# Patient Record
Sex: Male | Born: 1950
Health system: Southern US, Community
[De-identification: ages and names within clinical notes are randomized; demographics above are authoritative.]

## PROBLEM LIST (undated history)

## (undated) DIAGNOSIS — M1990 Unspecified osteoarthritis, unspecified site: Secondary | ICD-10-CM

## (undated) DIAGNOSIS — I509 Heart failure, unspecified: Secondary | ICD-10-CM

## (undated) DIAGNOSIS — J90 Pleural effusion, not elsewhere classified: Secondary | ICD-10-CM

## (undated) DIAGNOSIS — D631 Anemia in chronic kidney disease: Secondary | ICD-10-CM

## (undated) DIAGNOSIS — L97509 Non-pressure chronic ulcer of other part of unspecified foot with unspecified severity: Secondary | ICD-10-CM

## (undated) DIAGNOSIS — G709 Myoneural disorder, unspecified: Secondary | ICD-10-CM

## (undated) DIAGNOSIS — D509 Iron deficiency anemia, unspecified: Secondary | ICD-10-CM

## (undated) DIAGNOSIS — E119 Type 2 diabetes mellitus without complications: Secondary | ICD-10-CM

## (undated) DIAGNOSIS — F419 Anxiety disorder, unspecified: Secondary | ICD-10-CM

## (undated) DIAGNOSIS — I10 Essential (primary) hypertension: Secondary | ICD-10-CM

## (undated) DIAGNOSIS — N184 Chronic kidney disease, stage 4 (severe): Secondary | ICD-10-CM

## (undated) DIAGNOSIS — E785 Hyperlipidemia, unspecified: Secondary | ICD-10-CM

## (undated) HISTORY — DX: Non-pressure chronic ulcer of other part of unspecified foot with unspecified severity: L97.509

## (undated) HISTORY — DX: Iron deficiency anemia, unspecified: D50.9

## (undated) HISTORY — PX: COLONOSCOPY: SHX174

## (undated) HISTORY — DX: Type 2 diabetes mellitus without complications: E11.9

## (undated) HISTORY — DX: Essential (primary) hypertension: I10

## (undated) HISTORY — DX: Anemia in chronic kidney disease: D63.1

## (undated) HISTORY — PX: CYST EXCISION: SHX5701

## (undated) HISTORY — DX: Hyperlipidemia, unspecified: E78.5

## (undated) HISTORY — PX: ACHILLES TENDON REPAIR: SUR1153

## (undated) HISTORY — DX: Chronic kidney disease, unspecified: D63.1

---

## 2002-12-19 ENCOUNTER — Inpatient Hospital Stay (HOSPITAL_COMMUNITY): Admission: AD | Admit: 2002-12-19 | Discharge: 2002-12-22 | Payer: Self-pay | Admitting: Internal Medicine

## 2004-11-21 ENCOUNTER — Emergency Department (HOSPITAL_COMMUNITY): Admission: EM | Admit: 2004-11-21 | Discharge: 2004-11-21 | Payer: Self-pay | Admitting: Emergency Medicine

## 2005-06-19 ENCOUNTER — Encounter: Admission: RE | Admit: 2005-06-19 | Discharge: 2005-06-19 | Payer: Self-pay | Admitting: Orthopedic Surgery

## 2005-06-21 ENCOUNTER — Encounter: Admission: RE | Admit: 2005-06-21 | Discharge: 2005-06-21 | Payer: Self-pay | Admitting: Orthopedic Surgery

## 2010-03-04 ENCOUNTER — Observation Stay (HOSPITAL_COMMUNITY): Admission: EM | Admit: 2010-03-04 | Discharge: 2010-03-05 | Payer: Self-pay | Admitting: Emergency Medicine

## 2010-03-04 ENCOUNTER — Emergency Department (HOSPITAL_COMMUNITY): Admission: EM | Admit: 2010-03-04 | Discharge: 2010-03-04 | Payer: Self-pay | Admitting: Emergency Medicine

## 2010-06-17 ENCOUNTER — Emergency Department (HOSPITAL_COMMUNITY): Payer: No Typology Code available for payment source

## 2010-06-17 ENCOUNTER — Emergency Department (HOSPITAL_COMMUNITY)
Admission: EM | Admit: 2010-06-17 | Discharge: 2010-06-17 | Disposition: A | Discharge status: Home / Self Care | Payer: No Typology Code available for payment source | Attending: Emergency Medicine | Admitting: Emergency Medicine

## 2010-06-17 DIAGNOSIS — E119 Type 2 diabetes mellitus without complications: Secondary | ICD-10-CM | POA: Insufficient documentation

## 2010-06-17 DIAGNOSIS — I10 Essential (primary) hypertension: Secondary | ICD-10-CM | POA: Insufficient documentation

## 2010-06-17 DIAGNOSIS — Z79899 Other long term (current) drug therapy: Secondary | ICD-10-CM | POA: Insufficient documentation

## 2010-06-17 DIAGNOSIS — Z794 Long term (current) use of insulin: Secondary | ICD-10-CM | POA: Insufficient documentation

## 2010-06-17 DIAGNOSIS — IMO0002 Reserved for concepts with insufficient information to code with codable children: Secondary | ICD-10-CM | POA: Insufficient documentation

## 2010-06-17 LAB — BLOOD GAS, VENOUS
Acid-Base Excess: 1.3 mmol/L (ref 0.0–2.0)
Bicarbonate: 27.2 mEq/L — ABNORMAL HIGH (ref 20.0–24.0)
O2 Saturation: 57.8 %
Patient temperature: 98.6
TCO2: 23.9 mmol/L (ref 0–100)
pCO2, Ven: 49.3 mmHg (ref 45.0–50.0)
pH, Ven: 7.36 — ABNORMAL HIGH (ref 7.250–7.300)
pO2, Ven: 34.4 mmHg (ref 30.0–45.0)

## 2010-06-17 LAB — DIFFERENTIAL
Basophils Absolute: 0 10*3/uL (ref 0.0–0.1)
Basophils Relative: 1 % (ref 0–1)
Eosinophils Absolute: 0.3 10*3/uL (ref 0.0–0.7)
Eosinophils Relative: 4 % (ref 0–5)
Lymphocytes Relative: 31 % (ref 12–46)
Lymphs Abs: 2.6 10*3/uL (ref 0.7–4.0)
Monocytes Absolute: 0.6 10*3/uL (ref 0.1–1.0)
Monocytes Relative: 8 % (ref 3–12)
Neutro Abs: 4.8 10*3/uL (ref 1.7–7.7)
Neutrophils Relative %: 57 % (ref 43–77)

## 2010-06-17 LAB — URINALYSIS, ROUTINE W REFLEX MICROSCOPIC
Bilirubin Urine: NEGATIVE
Hgb urine dipstick: NEGATIVE
Ketones, ur: NEGATIVE mg/dL
Leukocytes, UA: NEGATIVE
Nitrite: NEGATIVE
Protein, ur: NEGATIVE mg/dL
Specific Gravity, Urine: 1.024 (ref 1.005–1.030)
Urine Glucose, Fasting: >1000 mg/dL — AB
Urobilinogen, UA: 0.2 mg/dL (ref 0.0–1.0)
pH: 7 (ref 5.0–8.0)

## 2010-06-17 LAB — POCT I-STAT, CHEM 8
BUN: 14 mg/dL (ref 6–23)
Calcium, Ion: 1.02 mmol/L — ABNORMAL LOW (ref 1.12–1.32)
Chloride: 104 mEq/L (ref 96–112)
Creatinine, Ser: 1.5 mg/dL (ref 0.4–1.5)
Glucose, Bld: 417 mg/dL — ABNORMAL HIGH (ref 70–99)
HCT: 48 % (ref 39.0–52.0)
Hemoglobin: 16.3 g/dL (ref 13.0–17.0)
Potassium: 4.8 mEq/L (ref 3.5–5.1)
Sodium: 137 mEq/L (ref 135–145)
TCO2: 24 mmol/L (ref 0–100)

## 2010-06-17 LAB — URINE MICROSCOPIC-ADD ON

## 2010-06-17 LAB — GLUCOSE, CAPILLARY: Glucose-Capillary: 174 mg/dL — ABNORMAL HIGH (ref 70–99)

## 2010-06-17 LAB — CBC
HCT: 44.7 % (ref 39.0–52.0)
Hemoglobin: 15.6 g/dL (ref 13.0–17.0)
MCH: 29.9 pg (ref 26.0–34.0)
MCHC: 34.9 g/dL (ref 30.0–36.0)
MCV: 85.6 fL (ref 78.0–100.0)
Platelets: 184 10*3/uL (ref 150–400)
RBC: 5.22 MIL/uL (ref 4.22–5.81)
RDW: 12.7 % (ref 11.5–15.5)
WBC: 8.3 10*3/uL (ref 4.0–10.5)

## 2010-07-17 LAB — URINALYSIS, ROUTINE W REFLEX MICROSCOPIC
Bilirubin Urine: NEGATIVE
Glucose, UA: >1000 mg/dL — AB
Hgb urine dipstick: NEGATIVE
Ketones, ur: 15 mg/dL — AB
Leukocytes, UA: NEGATIVE
Nitrite: NEGATIVE
Protein, ur: NEGATIVE mg/dL
Specific Gravity, Urine: 1.035 — ABNORMAL HIGH (ref 1.005–1.030)
Urobilinogen, UA: 0.2 mg/dL (ref 0.0–1.0)
pH: 5.5 (ref 5.0–8.0)

## 2010-07-17 LAB — GLUCOSE, CAPILLARY
Glucose-Capillary: 188 mg/dL — ABNORMAL HIGH (ref 70–99)
Glucose-Capillary: 256 mg/dL — ABNORMAL HIGH (ref 70–99)
Glucose-Capillary: 343 mg/dL — ABNORMAL HIGH (ref 70–99)
Glucose-Capillary: 350 mg/dL — ABNORMAL HIGH (ref 70–99)
Glucose-Capillary: 406 mg/dL — ABNORMAL HIGH (ref 70–99)
Glucose-Capillary: 408 mg/dL — ABNORMAL HIGH (ref 70–99)
Glucose-Capillary: 458 mg/dL — ABNORMAL HIGH (ref 70–99)
Glucose-Capillary: 506 mg/dL — ABNORMAL HIGH (ref 70–99)

## 2010-07-17 LAB — COMPREHENSIVE METABOLIC PANEL
ALT: 19 U/L (ref 0–53)
AST: 22 U/L (ref 0–37)
Albumin: 4.1 g/dL (ref 3.5–5.2)
Alkaline Phosphatase: 105 U/L (ref 39–117)
BUN: 19 mg/dL (ref 6–23)
CO2: 24 mEq/L (ref 19–32)
Calcium: 9.3 mg/dL (ref 8.4–10.5)
Chloride: 96 mEq/L (ref 96–112)
Creatinine, Ser: 1.74 mg/dL — ABNORMAL HIGH (ref 0.4–1.5)
GFR calc Af Amer: 49 mL/min — ABNORMAL LOW (ref >60)
GFR calc non Af Amer: 40 mL/min — ABNORMAL LOW (ref >60)
Glucose, Bld: 465 mg/dL — ABNORMAL HIGH (ref 70–99)
Potassium: 4.9 mEq/L (ref 3.5–5.1)
Sodium: 132 mEq/L — ABNORMAL LOW (ref 135–145)
Total Bilirubin: 1 mg/dL (ref 0.3–1.2)
Total Protein: 8.4 g/dL — ABNORMAL HIGH (ref 6.0–8.3)

## 2010-07-17 LAB — DIFFERENTIAL
Basophils Absolute: 0 10*3/uL (ref 0.0–0.1)
Basophils Relative: 0 % (ref 0–1)
Eosinophils Absolute: 0.2 10*3/uL (ref 0.0–0.7)
Eosinophils Relative: 2 % (ref 0–5)
Lymphocytes Relative: 26 % (ref 12–46)
Lymphs Abs: 3.4 10*3/uL (ref 0.7–4.0)
Monocytes Absolute: 0.9 10*3/uL (ref 0.1–1.0)
Monocytes Relative: 7 % (ref 3–12)
Neutro Abs: 8.4 10*3/uL — ABNORMAL HIGH (ref 1.7–7.7)
Neutrophils Relative %: 65 % (ref 43–77)

## 2010-07-17 LAB — URINE MICROSCOPIC-ADD ON

## 2010-07-17 LAB — CBC
HCT: 50.5 % (ref 39.0–52.0)
Hemoglobin: 17.9 g/dL — ABNORMAL HIGH (ref 13.0–17.0)
MCH: 30.2 pg (ref 26.0–34.0)
MCHC: 35.4 g/dL (ref 30.0–36.0)
MCV: 85.2 fL (ref 78.0–100.0)
Platelets: 242 10*3/uL (ref 150–400)
RBC: 5.93 MIL/uL — ABNORMAL HIGH (ref 4.22–5.81)
RDW: 12.6 % (ref 11.5–15.5)
WBC: 12.9 10*3/uL — ABNORMAL HIGH (ref 4.0–10.5)

## 2010-07-17 LAB — KETONES, QUALITATIVE: Acetone, Bld: NEGATIVE

## 2010-07-17 LAB — TROPONIN I: Troponin I: 0.02 ng/mL (ref 0.00–0.06)

## 2010-09-20 NOTE — Discharge Summary (Signed)
NAMEESPEN, LORINCZ                          ACCOUNT NO.:  [id number]   MEDICAL RECORD NO.:  [id number]                   PATIENT TYPE:  INP   LOCATION:  6729                                 FACILITY:  [facility]   PHYSICIAN:  Nolene Ebbs, M.D.                 DATE OF BIRTH:  03-02-1951   DATE OF ADMISSION:  12/19/2002  DATE OF DISCHARGE:  12/22/2002                                 DISCHARGE SUMMARY   ADMISSION DIAGNOSES:  1. New onset type 2 diabetes mellitus.  2. Dehydration.   DISCHARGE DIAGNOSES:  1. New onset type 2 diabetes mellitus.  2. Dyslipidemia.  3. Metabolic syndrome.   HISTORY OF PRESENT ILLNESS:  The patient is a 60 year old African-American  gentleman admitted directly from the office of [facility] with two  to three-week history of polydipsia, polyuria, weight loss, progressive  fatigue, weakness, and blurring of vision.  He has a strong family history  of diabetes in his father.  On initial evaluation, he was very dehydrated  with CBG reported as high, meaning over 500.  He has glucose and ketonuria.  His hemoglobin A1C was 20.7.  He was initially started in the office with  intravenous normal saline 1 liter and 10 units of Novolog Insulin and  subsequently admitted to a medical bed at [facility] for management  of severe hyperglycemia, diabetic condition, and initiation of therapy.   HOSPITAL COURSE:  On admission, the patient was started on intravenous  fluids, normal saline large volume.  Subcu 70/30 Insulin with sliding scale  insulin coverage. His initial studies including serum acetone was negative.  His blood gas did not show any acidosis.  Diabetic teaching was requested.  The patient was taught the basics of diabetes as well as insulin injections  and monitoring of CBG's and nutrition.  He immediately began to feel better  within 24 hours of admission.  A fasting lipid profile showed a total  cholesterol of 204, triglycerides of  510, HDL of 29.  He had one episode of  hypokalemia with potassium of 3.2 and this was repleted.  He was started on  Zocor 400 mg once a day as well as ACE inhibitor for renal protection.  On  December 22, 2002, the patient felt much better.  He did have a few headaches.  His vital signs were stable.  His CBG was 259.  His physical examination was  otherwise unremarkable.  He was felt to be stable for discharge.   DISCHARGE MEDICATIONS:  1. Altace 5 mg once a day.  2. Zocor 40 mg once a day.  3. Protonix 10 mg twice a day.  4. Actos 40 units q.h.s.   DIET:  1800 calorie ADA diet, 2 gram sodium, low cholesterol diet.  He is to  have CBG's t.i.d.   He is to return for review in the office.  Also to be followed  up at [facility] with Nolene Ebbs, M.D.   CONDITION ON DISCHARGE:  Stable.                                                Nolene Ebbs, M.D.    EA/MEDQ  D:  01/25/2003  T:  01/26/2003  Job:  [id number]

## 2015-02-28 DIAGNOSIS — R079 Chest pain, unspecified: Secondary | ICD-10-CM | POA: Diagnosis not present

## 2015-02-28 DIAGNOSIS — I10 Essential (primary) hypertension: Secondary | ICD-10-CM | POA: Diagnosis not present

## 2015-02-28 DIAGNOSIS — E781 Pure hyperglyceridemia: Secondary | ICD-10-CM | POA: Diagnosis not present

## 2015-02-28 DIAGNOSIS — E119 Type 2 diabetes mellitus without complications: Secondary | ICD-10-CM | POA: Diagnosis not present

## 2015-02-28 DIAGNOSIS — R972 Elevated prostate specific antigen [PSA]: Secondary | ICD-10-CM | POA: Diagnosis not present

## 2015-02-28 DIAGNOSIS — M2550 Pain in unspecified joint: Secondary | ICD-10-CM | POA: Diagnosis not present

## 2015-04-15 ENCOUNTER — Ambulatory Visit (INDEPENDENT_AMBULATORY_CARE_PROVIDER_SITE_OTHER): Payer: Medicare Other | Admitting: Family Medicine

## 2015-04-15 VITALS — BP 110/68 | Temp 98.6°F | Resp 16 | Ht 71.0 in | Wt 194.0 lb

## 2015-04-15 DIAGNOSIS — E119 Type 2 diabetes mellitus without complications: Secondary | ICD-10-CM

## 2015-04-15 DIAGNOSIS — Z794 Long term (current) use of insulin: Secondary | ICD-10-CM

## 2015-04-15 DIAGNOSIS — L0291 Cutaneous abscess, unspecified: Secondary | ICD-10-CM

## 2015-04-15 DIAGNOSIS — L0390 Cellulitis, unspecified: Secondary | ICD-10-CM | POA: Diagnosis not present

## 2015-04-15 MED ORDER — DOXYCYCLINE HYCLATE 100 MG PO TABS: 100.0000 mg | ORAL_TABLET | Freq: Two times a day (BID) | ORAL | Status: DC

## 2015-04-15 MED ORDER — SULFAMETHOXAZOLE-TRIMETHOPRIM 800-160 MG PO TABS: 1.0000 | ORAL_TABLET | Freq: Two times a day (BID) | ORAL | Status: DC

## 2015-04-15 MED ORDER — HYDROCODONE-ACETAMINOPHEN 5-325 MG PO TABS: 1.0000 | ORAL_TABLET | Freq: Four times a day (QID) | ORAL | Status: DC | PRN

## 2015-04-15 NOTE — Progress Notes (Signed)
   Subjective:    Patient ID: Melvin Mcclure, male    DOB: 03-Sep-1950, 64 y.o.   MRN: [place]:[id number] By signing my name below, I, Zola Button, attest that this documentation has been prepared under the direction and in the presence of Robyn Haber, MD.  Electronically Signed: Zola Button, Medical Scribe. 04/15/2015. 10:54 AM.  HPI HPI Comments: Melvin Mcclure is a 64 y.o. male with a history of DM who presents to the [facility] complaining of an abscess to the left gluteal crest of his buttocks that started 3-4 days ago. Patient has been using hot compresses. He has tried ibuprofen and Aleve but without relief.   Patient used to play basketball, football and baseball.   Review of Systems  Constitutional: Negative for fever.  Skin:       Abscess        Objective:   Physical Exam CONSTITUTIONAL: Well developed/well nourished HEAD: Normocephalic/atraumatic EYES: EOM/PERRL ENMT: Mucous membranes moist NECK: supple no meningeal signs SPINE: entire spine nontender CV: S1/S2 noted, no murmurs/rubs/gallops noted LUNGS: Lungs are clear to auscultation bilaterally, no apparent distress GU: 3 cm indurated fluctuant mass in the left gluteal cleft that is quite tender to palpation. NEURO: Pt is awake/alert, moves all extremitiesx4 EXTREMITIES: pulses normal, full ROM SKIN: warm, color normal PSYCH: no abnormalities of mood noted        Assessment & Plan:   This chart was scribed in my presence and reviewed by me personally.    ICD-9-CM ICD-10-CM   1. Cellulitis and abscess 682.9 L03.90 doxycycline (VIBRA-TABS) 100 MG tablet    L02.91 HYDROcodone-acetaminophen (NORCO) 5-325 MG tablet     Wound culture  2. Type 2 diabetes mellitus without complication, with long-term current use of insulin (HCC) 250.00 E11.9    V58.67 Z79.4      Signed, Robyn Haber, MD

## 2015-04-15 NOTE — Addendum Note (Signed)
Addended by: Orion Crook on: 04/15/2015 01:54 PM   Modules accepted: Orders

## 2015-04-15 NOTE — Progress Notes (Signed)
PROCEDURE NOTE: I&D of Abscess Verbal consent obtained. Local anesthesia with 3cc of 2% lidocaine with epinephrine. Site cleansed with alcohol prep pad.  Incision of 2cm was made using a 11 blade, discharge of copious amounts of pus and serosanguinous fluid. Wound cavity was explored with curved hemostats and aggressively packed with 1/2" plain packing. Cleansed and dressed. After care instructions provided. Patient to return to clinic on 04/17/2015 for reevaluation/repacking.  Jaynee Eagles, PA-C [facility] [phone] 04/15/2015  11:49 AM

## 2015-04-15 NOTE — Addendum Note (Signed)
Addended by: Orion Crook on: 04/15/2015 03:19 PM   Modules accepted: Orders, Medications

## 2015-04-15 NOTE — Patient Instructions (Addendum)
Abscess An abscess is an infected area that contains a collection of pus and debris.It can occur in almost any part of the body. An abscess is also known as a furuncle or boil. CAUSES  An abscess occurs when tissue gets infected. This can occur from blockage of oil or sweat glands, infection of hair follicles, or a minor injury to the skin. As the body tries to fight the infection, pus collects in the area and creates pressure under the skin. This pressure causes pain. People with weakened immune systems have difficulty fighting infections and get certain abscesses more often.  SYMPTOMS Usually an abscess develops on the skin and becomes a painful mass that is red, warm, and tender. If the abscess forms under the skin, you may feel a moveable soft area under the skin. Some abscesses break open (rupture) on their own, but most will continue to get worse without care. The infection can spread deeper into the body and eventually into the bloodstream, causing you to feel ill.  DIAGNOSIS  Your caregiver will take your medical history and perform a physical exam. A sample of fluid may also be taken from the abscess to determine what is causing your infection. TREATMENT  Your caregiver may prescribe antibiotic medicines to fight the infection. However, taking antibiotics alone usually does not cure an abscess. Your caregiver may need to make a small cut (incision) in the abscess to drain the pus. In some cases, gauze is packed into the abscess to reduce pain and to continue draining the area. HOME CARE INSTRUCTIONS   Only take over-the-counter or prescription medicines for pain, discomfort, or fever as directed by your caregiver.  If you were prescribed antibiotics, take them as directed. Finish them even if you start to feel better.  If gauze is used, follow your caregiver's directions for changing the gauze.  To avoid spreading the infection:  Keep your draining abscess covered with a  bandage.  Wash your hands well.  Do not share personal care items, towels, or whirlpools with others.  Avoid skin contact with others.  Keep your skin and clothes clean around the abscess.  Keep all follow-up appointments as directed by your caregiver. SEEK MEDICAL CARE IF:   You have increased pain, swelling, redness, fluid drainage, or bleeding.  You have muscle aches, chills, or a general ill feeling.  You have a fever. MAKE SURE YOU:   Understand these instructions.  Will watch your condition.  Will get help right away if you are not doing well or get worse.   This information is not intended to replace advice given to you by your health care provider. Make sure you discuss any questions you have with your health care provider.   Document Released: 01/29/2005 Document Revised: 10/21/2011 Document Reviewed: 07/04/2011 Elsevier Interactive Patient Education 2016 Elsevier Inc.  Incision and Drainage, Care After Refer to this sheet in the next few weeks. These instructions provide you with information on caring for yourself after your procedure. Your caregiver may also give you more specific instructions. Your treatment has been planned according to current medical practices, but problems sometimes occur. Call your caregiver if you have any problems or questions after your procedure. HOME CARE INSTRUCTIONS   If antibiotic medicine is given, take it as directed. Finish it even if you start to feel better.  Only take over-the-counter or prescription medicines for pain, discomfort, or fever as directed by your caregiver.  Keep all follow-up appointments as directed by your caregiver.    Change any bandages (dressings) as directed by your caregiver. Replace old dressings with clean dressings.  Wash your hands before and after caring for your wound. You will receive specific instructions for cleansing and caring for your wound.  SEEK MEDICAL CARE IF:   You have increased  pain, swelling, or redness around the wound.  You have increased drainage, smell, or bleeding from the wound.  You have muscle aches, chills, or you feel generally sick.  You have a fever. MAKE SURE YOU:   Understand these instructions.  Will watch your condition.  Will get help right away if you are not doing well or get worse.   This information is not intended to replace advice given to you by your health care provider. Make sure you discuss any questions you have with your health care provider.   Document Released: 07/14/2011 Document Revised: 05/12/2014 Document Reviewed: 07/14/2011 Elsevier Interactive Patient Education 2016 Elsevier Inc.   

## 2015-04-17 ENCOUNTER — Ambulatory Visit (INDEPENDENT_AMBULATORY_CARE_PROVIDER_SITE_OTHER): Payer: Medicare Other | Admitting: Physician Assistant

## 2015-04-17 VITALS — BP 114/84 | HR 74 | Temp 97.7°F | Resp 18 | Ht 71.0 in

## 2015-04-17 DIAGNOSIS — L0291 Cutaneous abscess, unspecified: Secondary | ICD-10-CM

## 2015-04-17 DIAGNOSIS — L0390 Cellulitis, unspecified: Secondary | ICD-10-CM

## 2015-04-17 LAB — WOUND CULTURE: Gram Stain: NONE SEEN

## 2015-04-17 NOTE — Progress Notes (Signed)
[facility] [address] [place] [address] [phone]  Date:  04/17/2015   Name:  Melvin Mcclure   DOB:  12/15/1950   MRN:  [id number]  PCP:  No primary care provider on file.    Chief Complaint: Wound Check   History of Present Illness:  This is a 64 y.o. male with PMH DM2, HTN who is presenting for wound care of an abscess on his buttock that underwent I&D 2 days ago. He was placed on bactrim. Doing well. States pain is much decreased but still with a lot of drainage. He is staying of his back side and applying heating pad. He denies fever or chills.  Review of Systems:  Review of Systems See HPI  There are no active problems to display for this patient.   Prior to Admission medications   Medication Sig Start Date End Date Taking? Authorizing Provider  amLODipine (NORVASC) 10 MG tablet Take 10 mg by mouth daily.   Yes Historical Provider, MD  aspirin 81 MG tablet Take 81 mg by mouth daily.   Yes Historical Provider, MD  gabapentin (NEURONTIN) 100 MG capsule Take 100 mg by mouth 3 (three) times daily.   Yes Historical Provider, MD  HYDROcodone-acetaminophen (NORCO) 5-325 MG tablet Take 1 tablet by mouth every 6 (six) hours as needed for moderate pain. 04/15/15  Yes Robyn Haber, MD  insulin aspart (NOVOLOG) 100 UNIT/ML injection Inject into the skin 3 (three) times daily before meals.   Yes Historical Provider, MD  insulin glargine (LANTUS) 100 UNIT/ML injection Inject into the skin at bedtime.   Yes Historical Provider, MD  sulfamethoxazole-trimethoprim (BACTRIM DS,SEPTRA DS) 800-160 MG tablet Take 1 tablet by mouth 2 (two) times daily. 04/15/15  Yes Robyn Haber, MD    No Known Allergies  Past Surgical History  Procedure Laterality Date  . Achilles tendon repair    . Cyst excision      Social History  Substance Use Topics  . Smoking status: Never Smoker   . Smokeless tobacco: None  . Alcohol Use: No    History reviewed. No  pertinent family history.  Medication list has been reviewed and updated.  Physical Examination:  Physical Exam  Constitutional: He is oriented to person, place, and time. He appears well-developed and well-nourished. No distress.  HENT:  Head: Normocephalic and atraumatic.  Right Ear: Hearing normal.  Left Ear: Hearing normal.  Nose: Nose normal.  Eyes: Conjunctivae and lids are normal. Right eye exhibits no discharge. Left eye exhibits no discharge. No scleral icterus.  Pulmonary/Chest: Effort normal. No respiratory distress.  Musculoskeletal: Normal range of motion.  Neurological: He is alert and oriented to person, place, and time.  Skin: Skin is warm and dry.  Dressing and packing removed.  Location: gluteal crest, left of midline Induration and maceration present Incision size: 1.5 cm Wound depth: 2 cm, tracks slightly toward midline Wound irrigated with 5 cc 1% lidocaine without epi Small additional purulence. 1/4 inch packing aggressively placed. Dressing replaced.   Psychiatric: He has a normal mood and affect. His speech is normal and behavior is normal. Thought content normal.   BP 114/84 mmHg  Pulse 74  Temp(Src) 97.7 F (36.5 C) (Oral)  Resp 18  Ht 5\' 11"  (1.803 m)  SpO2 98%  Assessment and Plan:  1. Cellulitis and abscess Still with purulent drainage. Symptoms have improved. Wound repacked and dressing placed. Continue bactrim. Discussed wound care. Return in 48 hours for wound care.  Benjaman Pott Drenda Freeze, MHS [facility]  04/17/2015

## 2015-04-19 ENCOUNTER — Encounter: Payer: Self-pay | Admitting: Physician Assistant

## 2015-04-19 ENCOUNTER — Ambulatory Visit (INDEPENDENT_AMBULATORY_CARE_PROVIDER_SITE_OTHER): Payer: Medicare Other | Admitting: Physician Assistant

## 2015-04-19 VITALS — BP 112/72 | HR 80 | Temp 97.9°F | Resp 18 | Ht 71.0 in

## 2015-04-19 DIAGNOSIS — L0390 Cellulitis, unspecified: Secondary | ICD-10-CM

## 2015-04-19 DIAGNOSIS — L0291 Cutaneous abscess, unspecified: Secondary | ICD-10-CM

## 2015-04-19 NOTE — Progress Notes (Signed)
[facility] [address], [place] [address] [phone]  Date:  04/19/2015   Name:  Melvin Mcclure   DOB:  12/10/1950   MRN:  [place]:[id number]  PCP:  No primary care provider on file.    Chief Complaint: Wound Check   History of Present Illness:  This is a 64 y.o. male with PMH DM2, HTN who is presenting for wound care of an abscess on his buttock that underwent I&D on 12/11. This is his 2nd wound check since that time. At last visit still with small amount purulence expressed. He is on bactrim and tolerating well. He states today that the wound is still draining a lot. Pain improved. Wife is a retired Marine scientist and has been doing dressing changes at least daily. He denies fever, chills, n/v.  Review of Systems:  Review of Systems See HPI  There are no active problems to display for this patient.   Prior to Admission medications   Medication Sig Start Date End Date Taking? Authorizing Provider  amLODipine (NORVASC) 10 MG tablet Take 10 mg by mouth daily.   Yes Historical Provider, MD  aspirin 81 MG tablet Take 81 mg by mouth daily.   Yes Historical Provider, MD  gabapentin (NEURONTIN) 100 MG capsule Take 100 mg by mouth 3 (three) times daily.   Yes Historical Provider, MD  HYDROcodone-acetaminophen (NORCO) 5-325 MG tablet Take 1 tablet by mouth every 6 (six) hours as needed for moderate pain. 04/15/15  Yes Robyn Haber, MD  insulin aspart (NOVOLOG) 100 UNIT/ML injection Inject into the skin 3 (three) times daily before meals.   Yes Historical Provider, MD  insulin glargine (LANTUS) 100 UNIT/ML injection Inject into the skin at bedtime.   Yes Historical Provider, MD  sulfamethoxazole-trimethoprim (BACTRIM DS,SEPTRA DS) 800-160 MG tablet Take 1 tablet by mouth 2 (two) times daily. 04/15/15  Yes Robyn Haber, MD    No Known Allergies  Past Surgical History  Procedure Laterality Date  . Achilles tendon repair    . Cyst excision      Social History   Substance Use Topics  . Smoking status: Never Smoker   . Smokeless tobacco: None  . Alcohol Use: No    History reviewed. No pertinent family history.  Medication list has been reviewed and updated.  Physical Examination:  Physical Exam  Constitutional: He is oriented to person, place, and time. He appears well-developed and well-nourished. No distress.  HENT:  Head: Normocephalic and atraumatic.  Right Ear: Hearing normal.  Left Ear: Hearing normal.  Nose: Nose normal.  Eyes: Conjunctivae and lids are normal. Right eye exhibits no discharge. Left eye exhibits no discharge. No scleral icterus.  Pulmonary/Chest: Effort normal. No respiratory distress.  Musculoskeletal: Normal range of motion.  Neurological: He is alert and oriented to person, place, and time.  Skin: Skin is warm and dry.  Dressing and packing removed.  Location: left buttock just left of gluteal crest Incision size: 1.5 cm. Skin no longer macerated. Still with same induration. Wound depth: 1.5-2 cm (mildly smaller than last visit). Necrotic tissue gently scraped away with curette.  Wound irrigated with 5 cc 1% lidocaine without epi Very minimal additional purulence. 1/4 inch packing loosely placed. Dressing replaced.   Psychiatric: He has a normal mood and affect. His speech is normal and behavior is normal. Thought content normal.    BP 112/72 mmHg  Pulse 80  Temp(Src) 97.9 F (36.6 C) (Oral)  Resp 18  Ht 5\' 11"  (  1.803 m)  SpO2 97%  Assessment and Plan:  1. Cellulitis and abscess Improving. Loosely packed. Wound care discussed. Continue abx. Return 48 hours for wound check.   Benjaman Pott Drenda Freeze, MHS [facility]  04/19/2015

## 2015-04-21 ENCOUNTER — Ambulatory Visit (INDEPENDENT_AMBULATORY_CARE_PROVIDER_SITE_OTHER): Payer: Medicare Other | Admitting: Physician Assistant

## 2015-04-21 ENCOUNTER — Encounter: Payer: Self-pay | Admitting: Physician Assistant

## 2015-04-21 VITALS — BP 124/78 | HR 77 | Temp 97.7°F | Resp 17 | Ht 70.5 in | Wt 198.0 lb

## 2015-04-21 DIAGNOSIS — R079 Chest pain, unspecified: Secondary | ICD-10-CM

## 2015-04-21 DIAGNOSIS — L0390 Cellulitis, unspecified: Secondary | ICD-10-CM

## 2015-04-21 DIAGNOSIS — L0291 Cutaneous abscess, unspecified: Secondary | ICD-10-CM | POA: Diagnosis not present

## 2015-04-21 NOTE — Progress Notes (Signed)
[facility] [address], [place] [address] [phone]  Date:  04/21/2015   Name:  Melvin Mcclure   DOB:  1950-12-08   MRN:  [id number]  PCP:  No PCP Per Patient    Chief Complaint: Dressing Change   History of Present Illness:  This is a 64 y.o. male with PMH DM2, HTN who is presenting for wound care of an abscess of his buttock that underwent I&D on 12/11. This is his 3rd wound check since that time. At last visit, only minimal purulence expressed. Necrotic tissue was removed from wound. Wound was loosely packed. He is on bactrim and tolerating well. Wound culture showed multiple organisms, none predominant. He is doing well today. Wife who has been doing his dressing changes states the surrounding skin is less indurated. He denies fever, chills.  Pt stating for the past 2-3 weeks he has noticed some left sided chest pain. Occurs only when he lays down at night. Described as dull and achy. No assoc sob, dizziness, palps, blurred vision, headache. No radiation. He mentioned this to his endocrinologist recently who did an EKG. He reported there were areas that were "turned down". He was told it was abnormal but nothing acutely wrong. He thinks there is a referral to cardiology in the works but isn't sure. His next appt with cardiology is jan 10. He walks up and down stairs several times a day and no sob or cp. No hx CAD. He did have a stress test several years ago that he reports was normal. No hx heartburn.  Review of Systems:  Review of Systems See HPI  There are no active problems to display for this patient.   Prior to Admission medications   Medication Sig Start Date End Date Taking? Authorizing Provider  amLODipine (NORVASC) 10 MG tablet Take 10 mg by mouth daily.   Yes Historical Provider, MD  aspirin 81 MG tablet Take 81 mg by mouth daily.   Yes Historical Provider, MD  gabapentin (NEURONTIN) 100 MG capsule Take 100 mg by mouth 3 (three) times daily.    Yes Historical Provider, MD  HYDROcodone-acetaminophen (NORCO) 5-325 MG tablet Take 1 tablet by mouth every 6 (six) hours as needed for moderate pain. 04/15/15  Yes Robyn Haber, MD  insulin aspart (NOVOLOG) 100 UNIT/ML injection Inject into the skin 3 (three) times daily before meals.   Yes Historical Provider, MD  insulin glargine (LANTUS) 100 UNIT/ML injection Inject into the skin at bedtime.   Yes Historical Provider, MD  sulfamethoxazole-trimethoprim (BACTRIM DS,SEPTRA DS) 800-160 MG tablet Take 1 tablet by mouth 2 (two) times daily. 04/15/15  Yes Robyn Haber, MD    No Known Allergies  Past Surgical History  Procedure Laterality Date  . Achilles tendon repair    . Cyst excision      Social History  Substance Use Topics  . Smoking status: Never Smoker   . Smokeless tobacco: None  . Alcohol Use: No    No family history on file.  Medication list has been reviewed and updated.  Physical Examination:  Physical Exam  Constitutional: He is oriented to person, place, and time. He appears well-developed and well-nourished. No distress.  HENT:  Head: Normocephalic and atraumatic.  Right Ear: Hearing normal.  Left Ear: Hearing normal.  Nose: Nose normal.  Eyes: Conjunctivae and lids are normal. Right eye exhibits no discharge. Left eye exhibits no discharge. No scleral icterus.  Neck: Carotid bruit is not present.  Cardiovascular:  Normal rate, regular rhythm, normal heart sounds and normal pulses.   No murmur heard. Pulmonary/Chest: Effort normal and breath sounds normal. No respiratory distress. He has no wheezes. He has no rhonchi. He has no rales. He exhibits no tenderness.  Musculoskeletal: Normal range of motion.  Neurological: He is alert and oriented to person, place, and time.  Skin: Skin is warm and dry.  Dressing and packing removed.  Location: left buttock, just left of gluteal crest Incision size: 1 cm Wound depth: 1 cm, smaller compared to last  visit Less induration than last visit. Very adherent necrotic tissue - some able to be pulled off and removed with scissors  Wound irrigated with 5 cc 1% lidocaine without epi No additional purulence. 1/4 inch packing loosely placed. Dressing replaced.   Psychiatric: He has a normal mood and affect. His speech is normal and behavior is normal. Thought content normal.   BP 124/78 mmHg  Pulse 77  Temp(Src) 97.7 F (36.5 C) (Oral)  Resp 17  Ht 5' 10.5" (1.791 m)  Wt 198 lb (89.812 kg)  BMI 28.00 kg/m2  SpO2 97%  Assessment and Plan:  1. Cellulitis and abscess Improving. Wound depth smaller and less induration than last visit. Loosely packed. Return in 48 hours for wound check. Suspect he will still need 1-2 more wound checks.  2. Chest pain, unspecified chest pain type CP with laying down. No symptoms with exertion. No assoc symptoms. He reports an abnormal EKG with possible ischemia performed recently at endocrinologist office. I offered to do another EKG here which he declined. He is unsure if referral to cards is in the works. I offered to refer him today. He declined stating he would talk to his endocrinologist at next visit 05/15/15. Counseled on red flag symptoms that would indicate he should return to clinic or go to ED.   Benjaman Pott Drenda Freeze, MHS [facility]  04/21/2015

## 2015-04-23 ENCOUNTER — Ambulatory Visit (INDEPENDENT_AMBULATORY_CARE_PROVIDER_SITE_OTHER): Payer: Medicare Other | Admitting: Urgent Care

## 2015-04-23 VITALS — BP 138/86 | HR 78 | Temp 98.6°F | Resp 16 | Ht 70.5 in | Wt 197.0 lb

## 2015-04-23 DIAGNOSIS — Z5189 Encounter for other specified aftercare: Secondary | ICD-10-CM

## 2015-04-23 DIAGNOSIS — L0291 Cutaneous abscess, unspecified: Secondary | ICD-10-CM

## 2015-04-23 DIAGNOSIS — L0390 Cellulitis, unspecified: Secondary | ICD-10-CM

## 2015-04-23 NOTE — Patient Instructions (Signed)
Incision and Drainage, Care After Refer to this sheet in the next few weeks. These instructions provide you with information on caring for yourself after your procedure. Your caregiver may also give you more specific instructions. Your treatment has been planned according to current medical practices, but problems sometimes occur. Call your caregiver if you have any problems or questions after your procedure. HOME CARE INSTRUCTIONS   If antibiotic medicine is given, take it as directed. Finish it even if you start to feel better.  Only take over-the-counter or prescription medicines for pain, discomfort, or fever as directed by your caregiver.  Keep all follow-up appointments as directed by your caregiver.  Change any bandages (dressings) as directed by your caregiver. Replace old dressings with clean dressings.  Wash your hands before and after caring for your wound. You will receive specific instructions for cleansing and caring for your wound.  SEEK MEDICAL CARE IF:   You have increased pain, swelling, or redness around the wound.  You have increased drainage, smell, or bleeding from the wound.  You have muscle aches, chills, or you feel generally sick.  You have a fever. MAKE SURE YOU:   Understand these instructions.  Will watch your condition.  Will get help right away if you are not doing well or get worse.   This information is not intended to replace advice given to you by your health care provider. Make sure you discuss any questions you have with your health care provider.   Document Released: 07/14/2011 Document Revised: 05/12/2014 Document Reviewed: 07/14/2011 Elsevier Interactive Patient Education 2016 Elsevier Inc.  

## 2015-04-24 NOTE — Progress Notes (Signed)
    MRN: Melvin Mcclure:[id number] DOB: 27-Oct-1950  Subjective:   Melvin Mcclure is a 64 y.o. male presenting for follow up on buttock abscess s/p I&D. Patient reports significant improvement, still finishing course of septra. Denies fevers, pain, redness, drainage of pus or bleeding. He has had wound care performed every day at home and here at our clinic.   [name] has a current medication list which includes the following prescription(s): amlodipine, aspirin, gabapentin, hydrocodone-acetaminophen, insulin aspart, insulin glargine, and sulfamethoxazole-trimethoprim. Also has No Known Allergies.  [name]  has a past medical history of Diabetes mellitus without complication ([place]); Hypertension; Hyperlipidemia; and Perforating neurotrophic ulcer of foot ([place]). Also  has past surgical history that includes Achilles tendon repair and Cyst excision.  Objective:   Vitals: BP 138/86 mmHg  Pulse 78  Temp(Src) 98.6 F (37 C) (Oral)  Resp 16  Ht 5' 10.5" (1.791 m)  Wt 197 lb (89.359 kg)  BMI 27.86 kg/m2  SpO2 95%  Physical Exam  Constitutional: He is oriented to person, place, and time. He appears well-developed and well-nourished.  Cardiovascular: Normal rate.   Pulmonary/Chest: Effort normal.  Genitourinary:     Neurological: He is alert and oriented to person, place, and time.  Skin: Skin is warm and dry. No rash noted. No erythema. No pallor.   WOUND CARE: Dressing removed. Packing removed. Wound irrigated with ~30cc of sterile water. Non-viable tissue debrided. No loculations found. Wound was not repacked. Cleansed and dressed.  Assessment and Plan :   1. Encounter for wound care 2. Cellulitis and abscess - Healing very well. Continue dressing changes at home without packing. Rtc in 2 days to reassess and consider wound care at home from there on.  Jaynee Eagles, PA-C [facility] [phone] 04/24/2015 7:01 PM

## 2015-04-25 ENCOUNTER — Ambulatory Visit (INDEPENDENT_AMBULATORY_CARE_PROVIDER_SITE_OTHER): Payer: Medicare Other | Admitting: Physician Assistant

## 2015-04-25 VITALS — BP 132/86 | HR 87 | Temp 98.0°F | Resp 16 | Ht 70.5 in | Wt 197.0 lb

## 2015-04-25 DIAGNOSIS — L0390 Cellulitis, unspecified: Secondary | ICD-10-CM

## 2015-04-25 DIAGNOSIS — L0291 Cutaneous abscess, unspecified: Secondary | ICD-10-CM

## 2015-04-25 DIAGNOSIS — Z5189 Encounter for other specified aftercare: Secondary | ICD-10-CM

## 2015-04-25 MED ORDER — SULFAMETHOXAZOLE-TRIMETHOPRIM 800-160 MG PO TABS: 1.0000 | ORAL_TABLET | Freq: Two times a day (BID) | ORAL | Status: AC

## 2015-04-25 NOTE — Progress Notes (Signed)
[facility] [address] [place] [address] [phone]  Date:  04/25/2015   Name:  Melvin Mcclure   DOB:  1951/01/10   MRN:  [id number]  PCP:  No PCP Per Patient    History of Present Illness:  Melvin Mcclure is a 64 y.o. male patient who presents to [facility] for follow up of abscess status post incision and drainage.  He has reports drainage of the abscess.  Finished antibiotic yesterday.  No pain or redness.  Wife is changing the dressing daily. Currently taking lantus, will need refill he reports from his endocrinologist.  States he may return there for follow up.  Patient reports blood sugar under good control.   There are no active problems to display for this patient.   Past Medical History  Diagnosis Date  . Diabetes mellitus without complication ([place])   . Hypertension   . Hyperlipidemia   . Perforating neurotrophic ulcer of foot [facility])     Past Surgical History  Procedure Laterality Date  . Achilles tendon repair    . Cyst excision      Social History  Substance Use Topics  . Smoking status: Never Smoker   . Smokeless tobacco: None  . Alcohol Use: No    History reviewed. No pertinent family history.  No Known Allergies  Medication list has been reviewed and updated.  Current Outpatient Prescriptions on File Prior to Visit  Medication Sig Dispense Refill  . amLODipine (NORVASC) 10 MG tablet Take 10 mg by mouth daily.    Marland Kitchen aspirin 81 MG tablet Take 81 mg by mouth daily.    Marland Kitchen gabapentin (NEURONTIN) 100 MG capsule Take 100 mg by mouth 3 (three) times daily.    Marland Kitchen HYDROcodone-acetaminophen (NORCO) 5-325 MG tablet Take 1 tablet by mouth every 6 (six) hours as needed for moderate pain. 12 tablet 0  . insulin aspart (NOVOLOG) 100 UNIT/ML injection Inject into the skin 3 (three) times daily before meals.    . insulin glargine (LANTUS) 100 UNIT/ML injection Inject into the skin at bedtime.    . sulfamethoxazole-trimethoprim (BACTRIM DS,SEPTRA  DS) 800-160 MG tablet Take 1 tablet by mouth 2 (two) times daily. 20 tablet 0   No current facility-administered medications on file prior to visit.    ROS ROS otherwise unremarkable unless listed above.  Physical Examination: BP 132/86 mmHg  Pulse 87  Temp(Src) 98 F (36.7 C) (Oral)  Resp 16  Ht 5' 10.5" (1.791 m)  Wt 197 lb (89.359 kg)  BMI 27.86 kg/m2  SpO2 97% Ideal Body Weight: Weight in (lb) to have BMI = 25: 176.4  Physical Exam  Constitutional: He is oriented to person, place, and time. He appears well-developed and well-nourished. No distress.  HENT:  Head: Normocephalic and atraumatic.  Eyes: Conjunctivae and EOM are normal. Pupils are equal, round, and reactive to light.  Cardiovascular: Normal rate.   Pulmonary/Chest: Effort normal. No respiratory distress.  Neurological: He is alert and oriented to person, place, and time.  Skin: Skin is warm and dry. He is not diaphoretic.  Purulent necrotic tissue at the wound.  With gentle palpation, there is some purulent drainage that comes from the pore.    Psychiatric: He has a normal mood and affect. His behavior is normal.   Wound scraped of necrosis to sanguinous healthy tissue.  Small packing placed.  Assessment and Plan: Melvin Mcclure is a 64 y.o. male who is here today for wound recheck.  Advised to return in 2 days.  Cellulitis and abscess - Plan: sulfamethoxazole-trimethoprim (BACTRIM DS,SEPTRA DS) 800-160 MG tablet  Encounter for wound care Restarting the bactrim for 4 more days.  I have advised that he pick up the lantus as this is very important that he control his glucose at this time.   Ivar Drape, PA-C [facility] 04/25/2015 11:49 AM

## 2015-04-25 NOTE — Patient Instructions (Signed)
Please continue the bactrim for the next 4 days.  I would like you to return on Friday.

## 2015-04-27 ENCOUNTER — Ambulatory Visit (INDEPENDENT_AMBULATORY_CARE_PROVIDER_SITE_OTHER): Payer: Medicare Other | Admitting: Physician Assistant

## 2015-04-27 VITALS — BP 112/76 | HR 68 | Temp 97.9°F | Resp 16 | Ht 70.5 in | Wt 196.8 lb

## 2015-04-27 DIAGNOSIS — Z5189 Encounter for other specified aftercare: Secondary | ICD-10-CM

## 2015-04-27 DIAGNOSIS — L0390 Cellulitis, unspecified: Secondary | ICD-10-CM

## 2015-04-27 DIAGNOSIS — L0291 Cutaneous abscess, unspecified: Secondary | ICD-10-CM

## 2015-04-27 NOTE — Progress Notes (Signed)
   Fred Adjei  MRN: [id number] DOB: Feb 15, 1951  Subjective:  Pt presents to clinic for wound recheck.  He is not having problems with in the abscess and he is tolerating the abx ok.  He has been changing the drsg daily and it has been draining minimally.  There are no active problems to display for this patient.   Current Outpatient Prescriptions on File Prior to Visit  Medication Sig Dispense Refill  . amLODipine (NORVASC) 10 MG tablet Take 10 mg by mouth daily.    Marland Kitchen aspirin 81 MG tablet Take 81 mg by mouth daily.    Marland Kitchen gabapentin (NEURONTIN) 100 MG capsule Take 100 mg by mouth 3 (three) times daily.    Marland Kitchen HYDROcodone-acetaminophen (NORCO) 5-325 MG tablet Take 1 tablet by mouth every 6 (six) hours as needed for moderate pain. 12 tablet 0  . insulin aspart (NOVOLOG) 100 UNIT/ML injection Inject into the skin 3 (three) times daily before meals.    . insulin glargine (LANTUS) 100 UNIT/ML injection Inject into the skin at bedtime.    . sulfamethoxazole-trimethoprim (BACTRIM DS,SEPTRA DS) 800-160 MG tablet Take 1 tablet by mouth 2 (two) times daily. 8 tablet 0   No current facility-administered medications on file prior to visit.    No Known Allergies  Review of Systems  Constitutional: Negative for fever and chills.  Gastrointestinal: Negative for nausea.  Skin: Positive for wound.   Objective:  BP 112/76 mmHg  Pulse 68  Temp(Src) 97.9 F (36.6 C) (Oral)  Resp 16  Ht 5' 10.5" (1.791 m)  Wt 196 lb 12.8 oz (89.268 kg)  BMI 27.83 kg/m2  SpO2 96%  Physical Exam  Constitutional: He is oriented to person, place, and time and well-developed, well-nourished, and in no distress.  HENT:  Head: Normocephalic and atraumatic.  Right Ear: External ear normal.  Left Ear: External ear normal.  Eyes: Conjunctivae are normal.  Neck: Normal range of motion.  Pulmonary/Chest: Effort normal.  Neurological: He is alert and oriented to person, place, and time. Gait normal.  Skin: Skin is  warm and dry.  Packing was gone from the wound - the wound depth is app 1cm and the incision is about 1 cm long.  There is a small amount of eschar along the edge of the incision.    Psychiatric: Mood, memory, affect and judgment normal.    Assessment and Plan :  Cellulitis and abscess  Encounter for wound care   Finish abx. No packing needed today.  Pt to clean the wound with soap and water.  Recheck if there is any problems.  Windell Hummingbird PA-C  [facility] 04/27/2015 9:56 AM

## 2015-04-28 ENCOUNTER — Encounter: Payer: Self-pay | Admitting: Physician Assistant

## 2015-06-05 DIAGNOSIS — E119 Type 2 diabetes mellitus without complications: Secondary | ICD-10-CM | POA: Diagnosis not present

## 2015-06-05 DIAGNOSIS — R51 Headache: Secondary | ICD-10-CM | POA: Diagnosis not present

## 2015-06-24 ENCOUNTER — Ambulatory Visit (INDEPENDENT_AMBULATORY_CARE_PROVIDER_SITE_OTHER): Payer: Medicare Other | Admitting: Internal Medicine

## 2015-06-24 ENCOUNTER — Ambulatory Visit (HOSPITAL_COMMUNITY): Payer: Self-pay

## 2015-06-24 ENCOUNTER — Ambulatory Visit (INDEPENDENT_AMBULATORY_CARE_PROVIDER_SITE_OTHER): Payer: Medicare Other

## 2015-06-24 ENCOUNTER — Ambulatory Visit: Payer: Medicare Other

## 2015-06-24 VITALS — BP 122/78 | HR 74 | Temp 98.7°F | Resp 14 | Ht 70.0 in | Wt 203.0 lb

## 2015-06-24 DIAGNOSIS — M5010 Cervical disc disorder with radiculopathy, unspecified cervical region: Secondary | ICD-10-CM | POA: Diagnosis not present

## 2015-06-24 DIAGNOSIS — R0789 Other chest pain: Secondary | ICD-10-CM

## 2015-06-24 DIAGNOSIS — L0291 Cutaneous abscess, unspecified: Secondary | ICD-10-CM | POA: Diagnosis not present

## 2015-06-24 DIAGNOSIS — L0390 Cellulitis, unspecified: Secondary | ICD-10-CM

## 2015-06-24 DIAGNOSIS — M542 Cervicalgia: Secondary | ICD-10-CM | POA: Diagnosis not present

## 2015-06-24 DIAGNOSIS — M50322 Other cervical disc degeneration at C5-C6 level: Secondary | ICD-10-CM | POA: Diagnosis not present

## 2015-06-24 MED ORDER — HYDROCODONE-ACETAMINOPHEN 5-325 MG PO TABS: ORAL_TABLET | ORAL | Status: DC

## 2015-06-24 MED ORDER — CYCLOBENZAPRINE HCL 10 MG PO TABS: 10.0000 mg | ORAL_TABLET | Freq: Every day | ORAL | Status: DC

## 2015-06-24 MED ORDER — GABAPENTIN 300 MG PO CAPS: 300.0000 mg | ORAL_CAPSULE | Freq: Three times a day (TID) | ORAL | Status: DC

## 2015-06-24 MED ORDER — MELOXICAM 15 MG PO TABS: 15.0000 mg | ORAL_TABLET | Freq: Every day | ORAL | Status: DC

## 2015-06-24 NOTE — Progress Notes (Addendum)
Subjective:    Patient ID: Melvin Mcclure, male    DOB: 07-02-1950, 65 y.o.   MRN: [id number] By signing my name below, I, Zola Button, attest that this documentation has been prepared under the direction and in the presence of Tami Lin, MD.  Electronically Signed: Zola Button, Medical Scribe. 06/24/2015. 1:33 PM.  HPI HPI Comments: Melvin Mcclure is a 65 y.o. male with a history of DM with peripheral neuropathy who [facility] the [facility] complaining of constant pain to his left scapula which radiates around the circumference of his chest that started about a week ago. The pain first began about 2 months ago, but it initially only affected him when lying down. He believes the area may be inflamed. Patient denies SOB, palpitations, neck stiffness, numbness/tingling in his arms, fever, and cough. He denies cardiac history. His wife indicates that this pain has been on and off for several months.  Outside problems include: Diabetes with neuropathy on insulin Hypertension Hyperlipidemia  Medications: Norvasc Insulin Lopressor Neurontin recently increased to 300 3 times a day   Review of Systems  Constitutional: Negative for activity change, appetite change, fatigue and unexpected weight change.  HENT: Negative for trouble swallowing and voice change.   Eyes: Negative for photophobia and visual disturbance.  Respiratory: Negative for choking, chest tightness and shortness of breath.   Cardiovascular: Negative for chest pain, palpitations and leg swelling.  Gastrointestinal: Negative for abdominal pain.  Genitourinary: Negative for difficulty urinating.  Musculoskeletal:       He has noted some neck stiffness Has no paresthesias in hands He has had this pain radiated into his left deltoid area  Neurological: Negative for dizziness and headaches.  Hematological: Bruises/bleeds easily.       Objective:   Physical Exam  Constitutional: He is oriented  to person, place, and time. He appears well-developed and well-nourished. No distress.  HENT:  Head: Normocephalic and atraumatic.  Mouth/Throat: Oropharynx is clear and moist. No oropharyngeal exudate.  Eyes: Pupils are equal, round, and reactive to light.  Neck: Neck supple.  Stiffness on rotation to the right and extension. Tender along left scapular border, but no pain with spinal twist or flex.  Cardiovascular: Normal rate, regular rhythm and normal heart sounds.   No murmur heard. Pulmonary/Chest: Effort normal and breath sounds normal. No respiratory distress. He exhibits no tenderness.  Clear to auscultation bilaterally. The chest wall is non-tender.  Musculoskeletal: He exhibits no edema.  Left shoulder has full ROM without pain.  Neurological: He is alert and oriented to person, place, and time. No cranial nerve deficit.  Skin: Skin is warm and dry. No rash noted.  Psychiatric: He has a normal mood and affect. His behavior is normal.  Nursing note and vitals reviewed.  BP 122/78 mmHg  Pulse 74  Temp(Src) 98.7 F (37.1 C) (Oral)  Resp 14  Ht 5\' 10"  (1.778 m)  Wt 203 lb (92.08 kg)  BMI 29.13 kg/m2  SpO2 98% Chest x-ray is clear Cervical spine has marked degenerative changes multilevel  EKG-RBBB//LAE??lateral flipped Ts((he suggests this has been this way at Dr Ainsley Spinner)     Assessment & Plan:  Other chest pain -this is likely radicular from his neck /there is not an exertional component  Cervical disc disorder with radiculopathy of cervical region - Plan: MR Cervical Spine Wo Contrast  Meds ordered this encounter  Medications  . metoprolol (LOPRESSOR) 100 MG tablet    Sig:   . DISCONTD:  gabapentin (NEURONTIN) 300 MG capsule    Sig:   . HYDROcodone-acetaminophen (NORCO) 5-325 MG tablet    Sig: 1-2 at bedtime    Dispense:  20 tablet    Refill:  0  . cyclobenzaprine (FLEXERIL) 10 MG tablet    Sig: Take 1 tablet (10 mg total) by mouth at bedtime.    Dispense:  15  tablet    Refill:  0  . meloxicam (MOBIC) 15 MG tablet    Sig: Take 1 tablet (15 mg total) by mouth daily.    Dispense:  30 tablet    Refill:  0  . gabapentin (NEURONTIN) 300 MG capsule    Sig: Take 1 capsule (300 mg total) by mouth 3 (three) times daily.    Dispense:  90 capsule    Refill:  1   Will call with MRI results and plan PCP Dr. Carlis Abbott-  I have completed the patient encounter in its entirety as documented by the scribe, with editing by me where necessary. Bertice Risse P. Laney Pastor, M.D.

## 2015-07-01 ENCOUNTER — Ambulatory Visit
Admission: RE | Admit: 2015-07-01 | Discharge: 2015-07-01 | Disposition: A | Discharge status: Home / Self Care | Payer: Medicare Other | Source: Ambulatory Visit | Attending: Internal Medicine | Admitting: Internal Medicine

## 2015-07-01 DIAGNOSIS — M4802 Spinal stenosis, cervical region: Secondary | ICD-10-CM | POA: Diagnosis not present

## 2015-07-01 DIAGNOSIS — M542 Cervicalgia: Secondary | ICD-10-CM

## 2015-07-01 DIAGNOSIS — M5010 Cervical disc disorder with radiculopathy, unspecified cervical region: Secondary | ICD-10-CM

## 2015-07-10 ENCOUNTER — Telehealth: Payer: Self-pay

## 2015-07-10 ENCOUNTER — Other Ambulatory Visit: Payer: Self-pay

## 2015-07-10 DIAGNOSIS — M4802 Spinal stenosis, cervical region: Secondary | ICD-10-CM

## 2015-07-10 DIAGNOSIS — M542 Cervicalgia: Secondary | ICD-10-CM

## 2015-07-10 NOTE — Telephone Encounter (Signed)
MRI shows a lot of chronic disease in spine causing his sxtoms--he needs neurosurgical evaluation--can we make referral--does he have a choice?

## 2015-07-10 NOTE — Telephone Encounter (Signed)
Please review

## 2015-07-10 NOTE — Telephone Encounter (Signed)
Pt states he had an MRI done and the report was sent to Dr Laney Pastor, would like to know the results. Please call [phone]

## 2015-07-10 NOTE — Telephone Encounter (Signed)
SPoke with pt, advised results. Pt understood and will check with his wife about referral.

## 2015-07-17 NOTE — Telephone Encounter (Addendum)
Pt states Dr Laney Pastor have to be the one making the referral, he or his wife is able to do. Also pt states he would prefer taking the OXYCODONE instead of the HYDROCODONE. Please call [phone]   Would like to be referred to see Dr Ashok Pall if possible

## 2015-07-18 NOTE — Telephone Encounter (Signed)
Referrals can we work on this?

## 2015-07-18 NOTE — Telephone Encounter (Signed)
Prefers dr Christella Noa

## 2015-07-18 NOTE — Telephone Encounter (Signed)
Pt called again today to check on status of his referral, states he is in a lot of pain and really need the referral asap. Please call [phone]

## 2015-07-20 ENCOUNTER — Telehealth: Payer: Self-pay

## 2015-07-20 DIAGNOSIS — L0390 Cellulitis, unspecified: Principal | ICD-10-CM

## 2015-07-20 DIAGNOSIS — L0291 Cutaneous abscess, unspecified: Secondary | ICD-10-CM

## 2015-07-20 NOTE — Telephone Encounter (Signed)
Pt's wife called.  Pt still in pain.  Referral to Neuro sent on 3/15.   Can pt have refill of Mobic, Norco and Flexeril?  [facility] [place].  Thanks

## 2015-07-21 ENCOUNTER — Telehealth: Payer: Self-pay

## 2015-07-21 MED ORDER — CYCLOBENZAPRINE HCL 10 MG PO TABS: 10.0000 mg | ORAL_TABLET | Freq: Every day | ORAL | Status: DC

## 2015-07-21 MED ORDER — HYDROCODONE-ACETAMINOPHEN 5-325 MG PO TABS: ORAL_TABLET | ORAL | Status: DC

## 2015-07-21 MED ORDER — MELOXICAM 15 MG PO TABS: 15.0000 mg | ORAL_TABLET | Freq: Every day | ORAL | Status: DC

## 2015-07-21 NOTE — Telephone Encounter (Signed)
Patient is completely out of his cyclobenzaprine (FLEXERIL) 10 MG tablet and he is almost out of his meloxicam (MOBIC) 15 MG tablet, patients wife did pick up his RX for HYDROcodone-acetaminophen (NORCO) 5-325 MG tablet and she has called in the gabapentin (NEURONTIN) 300 MG capsule she stated there was one refill left on it. Stated that he has an appointment with the neurosurgen next week but he needs something for the pain. Can someone call and advise if they can up the dose of gabapentin (NEURONTIN) 300 MG capsule or refill the cyclobenzaprine (FLEXERIL) 10 MG tablet to help him.  Her or his call back number  Is [phone]. Patient is Engineer, maintenance (IT) patient.

## 2015-07-21 NOTE — Telephone Encounter (Signed)
Left message on pt's voicemail stating that prescription is at the office and ready for pick up.

## 2015-07-21 NOTE — Telephone Encounter (Signed)
Meds ordered this encounter  Medications  . cyclobenzaprine (FLEXERIL) 10 MG tablet    Sig: Take 1 tablet (10 mg total) by mouth at bedtime.    Dispense:  15 tablet    Refill:  0  . meloxicam (MOBIC) 15 MG tablet    Sig: Take 1 tablet (15 mg total) by mouth daily.    Dispense:  30 tablet    Refill:  0  . HYDROcodone-acetaminophen (NORCO) 5-325 MG tablet    Sig: 1-2 at bedtime    Dispense:  20 tablet    Refill:  0

## 2015-07-22 NOTE — Telephone Encounter (Signed)
Spoke with patient advised prescription ready

## 2015-07-23 DIAGNOSIS — M542 Cervicalgia: Secondary | ICD-10-CM | POA: Diagnosis not present

## 2015-07-23 DIAGNOSIS — M47892 Other spondylosis, cervical region: Secondary | ICD-10-CM | POA: Diagnosis not present

## 2015-07-23 DIAGNOSIS — Z6830 Body mass index (BMI) 30.0-30.9, adult: Secondary | ICD-10-CM | POA: Diagnosis not present

## 2015-07-25 DIAGNOSIS — M5134 Other intervertebral disc degeneration, thoracic region: Secondary | ICD-10-CM | POA: Diagnosis not present

## 2015-07-25 DIAGNOSIS — M546 Pain in thoracic spine: Secondary | ICD-10-CM | POA: Diagnosis not present

## 2015-07-29 NOTE — Telephone Encounter (Signed)
completed

## 2015-08-23 ENCOUNTER — Other Ambulatory Visit: Payer: Self-pay | Admitting: Internal Medicine

## 2015-08-24 ENCOUNTER — Other Ambulatory Visit: Payer: Self-pay | Admitting: Internal Medicine

## 2015-09-17 ENCOUNTER — Other Ambulatory Visit: Payer: Self-pay | Admitting: Neurosurgery

## 2015-09-17 DIAGNOSIS — M546 Pain in thoracic spine: Secondary | ICD-10-CM

## 2015-09-17 DIAGNOSIS — Z6830 Body mass index (BMI) 30.0-30.9, adult: Secondary | ICD-10-CM | POA: Diagnosis not present

## 2015-09-18 NOTE — Telephone Encounter (Signed)
REFERRALS, has this been done if so please close the encounter.

## 2015-09-25 ENCOUNTER — Other Ambulatory Visit: Payer: Self-pay | Admitting: Neurosurgery

## 2015-09-25 ENCOUNTER — Ambulatory Visit
Admission: RE | Admit: 2015-09-25 | Discharge: 2015-09-25 | Disposition: A | Discharge status: Home / Self Care | Payer: Medicare Other | Source: Ambulatory Visit | Attending: Neurosurgery | Admitting: Neurosurgery

## 2015-09-25 DIAGNOSIS — I10 Essential (primary) hypertension: Secondary | ICD-10-CM | POA: Diagnosis not present

## 2015-09-25 DIAGNOSIS — E1122 Type 2 diabetes mellitus with diabetic chronic kidney disease: Secondary | ICD-10-CM | POA: Diagnosis not present

## 2015-09-25 DIAGNOSIS — M546 Pain in thoracic spine: Secondary | ICD-10-CM

## 2015-09-25 DIAGNOSIS — R918 Other nonspecific abnormal finding of lung field: Secondary | ICD-10-CM | POA: Diagnosis not present

## 2015-09-25 DIAGNOSIS — M2550 Pain in unspecified joint: Secondary | ICD-10-CM | POA: Diagnosis not present

## 2015-09-25 DIAGNOSIS — M792 Neuralgia and neuritis, unspecified: Secondary | ICD-10-CM | POA: Diagnosis not present

## 2015-10-29 DIAGNOSIS — Z6830 Body mass index (BMI) 30.0-30.9, adult: Secondary | ICD-10-CM | POA: Diagnosis not present

## 2015-10-29 DIAGNOSIS — M546 Pain in thoracic spine: Secondary | ICD-10-CM | POA: Diagnosis not present

## 2015-10-29 DIAGNOSIS — I10 Essential (primary) hypertension: Secondary | ICD-10-CM | POA: Diagnosis not present

## 2015-11-08 DIAGNOSIS — E1165 Type 2 diabetes mellitus with hyperglycemia: Secondary | ICD-10-CM | POA: Diagnosis not present

## 2015-11-08 DIAGNOSIS — I10 Essential (primary) hypertension: Secondary | ICD-10-CM | POA: Diagnosis not present

## 2015-11-08 DIAGNOSIS — E781 Pure hyperglyceridemia: Secondary | ICD-10-CM | POA: Diagnosis not present

## 2015-11-22 DIAGNOSIS — E1165 Type 2 diabetes mellitus with hyperglycemia: Secondary | ICD-10-CM | POA: Diagnosis not present

## 2015-11-22 DIAGNOSIS — E781 Pure hyperglyceridemia: Secondary | ICD-10-CM | POA: Diagnosis not present

## 2015-11-22 DIAGNOSIS — I10 Essential (primary) hypertension: Secondary | ICD-10-CM | POA: Diagnosis not present

## 2016-01-16 ENCOUNTER — Other Ambulatory Visit: Payer: Self-pay

## 2016-01-16 MED ORDER — GABAPENTIN 300 MG PO CAPS: 300.0000 mg | ORAL_CAPSULE | Freq: Three times a day (TID) | ORAL | 0 refills | Status: DC

## 2016-02-28 ENCOUNTER — Other Ambulatory Visit: Payer: Self-pay | Admitting: Urgent Care

## 2016-02-28 NOTE — Telephone Encounter (Signed)
06/2015 last exam doolittle No recent labworrk since 2012? Needs ov

## 2016-03-13 NOTE — Telephone Encounter (Signed)
Done- please let the patient know about the new PCP before more refills are given to him.

## 2016-03-30 DIAGNOSIS — M549 Dorsalgia, unspecified: Secondary | ICD-10-CM | POA: Diagnosis not present

## 2016-05-26 ENCOUNTER — Other Ambulatory Visit: Payer: Self-pay | Admitting: Physician Assistant

## 2016-06-02 DIAGNOSIS — Z125 Encounter for screening for malignant neoplasm of prostate: Secondary | ICD-10-CM | POA: Diagnosis not present

## 2016-06-02 DIAGNOSIS — E119 Type 2 diabetes mellitus without complications: Secondary | ICD-10-CM | POA: Diagnosis not present

## 2016-06-02 DIAGNOSIS — E559 Vitamin D deficiency, unspecified: Secondary | ICD-10-CM | POA: Diagnosis not present

## 2016-06-02 DIAGNOSIS — I10 Essential (primary) hypertension: Secondary | ICD-10-CM | POA: Diagnosis not present

## 2016-06-02 DIAGNOSIS — E1165 Type 2 diabetes mellitus with hyperglycemia: Secondary | ICD-10-CM | POA: Diagnosis not present

## 2016-06-09 DIAGNOSIS — E1165 Type 2 diabetes mellitus with hyperglycemia: Secondary | ICD-10-CM | POA: Diagnosis not present

## 2016-06-16 DIAGNOSIS — E1165 Type 2 diabetes mellitus with hyperglycemia: Secondary | ICD-10-CM | POA: Diagnosis not present

## 2016-07-17 DIAGNOSIS — I10 Essential (primary) hypertension: Secondary | ICD-10-CM | POA: Diagnosis not present

## 2016-07-17 DIAGNOSIS — E1165 Type 2 diabetes mellitus with hyperglycemia: Secondary | ICD-10-CM | POA: Diagnosis not present

## 2016-07-17 DIAGNOSIS — E781 Pure hyperglyceridemia: Secondary | ICD-10-CM | POA: Diagnosis not present

## 2017-03-07 ENCOUNTER — Emergency Department (HOSPITAL_COMMUNITY): Payer: Medicare Other

## 2017-03-07 ENCOUNTER — Encounter (HOSPITAL_COMMUNITY): Payer: Self-pay

## 2017-03-07 ENCOUNTER — Emergency Department (HOSPITAL_COMMUNITY)
Admission: EM | Admit: 2017-03-07 | Discharge: 2017-03-07 | Disposition: A | Discharge status: Home / Self Care | Payer: Medicare Other | Attending: Emergency Medicine | Admitting: Emergency Medicine

## 2017-03-07 DIAGNOSIS — Y929 Unspecified place or not applicable: Secondary | ICD-10-CM | POA: Insufficient documentation

## 2017-03-07 DIAGNOSIS — W228XXA Striking against or struck by other objects, initial encounter: Secondary | ICD-10-CM | POA: Insufficient documentation

## 2017-03-07 DIAGNOSIS — Z7982 Long term (current) use of aspirin: Secondary | ICD-10-CM | POA: Diagnosis not present

## 2017-03-07 DIAGNOSIS — G9340 Encephalopathy, unspecified: Secondary | ICD-10-CM | POA: Diagnosis not present

## 2017-03-07 DIAGNOSIS — S92411A Displaced fracture of proximal phalanx of right great toe, initial encounter for closed fracture: Secondary | ICD-10-CM | POA: Diagnosis not present

## 2017-03-07 DIAGNOSIS — Z79899 Other long term (current) drug therapy: Secondary | ICD-10-CM | POA: Insufficient documentation

## 2017-03-07 DIAGNOSIS — I10 Essential (primary) hypertension: Secondary | ICD-10-CM | POA: Insufficient documentation

## 2017-03-07 DIAGNOSIS — S92421A Displaced fracture of distal phalanx of right great toe, initial encounter for closed fracture: Secondary | ICD-10-CM | POA: Insufficient documentation

## 2017-03-07 DIAGNOSIS — E119 Type 2 diabetes mellitus without complications: Secondary | ICD-10-CM | POA: Diagnosis not present

## 2017-03-07 DIAGNOSIS — R112 Nausea with vomiting, unspecified: Secondary | ICD-10-CM | POA: Diagnosis not present

## 2017-03-07 DIAGNOSIS — R404 Transient alteration of awareness: Secondary | ICD-10-CM | POA: Diagnosis not present

## 2017-03-07 DIAGNOSIS — R531 Weakness: Secondary | ICD-10-CM | POA: Diagnosis not present

## 2017-03-07 DIAGNOSIS — R4020 Unspecified coma: Secondary | ICD-10-CM | POA: Diagnosis not present

## 2017-03-07 DIAGNOSIS — Y999 Unspecified external cause status: Secondary | ICD-10-CM | POA: Insufficient documentation

## 2017-03-07 DIAGNOSIS — Y939 Activity, unspecified: Secondary | ICD-10-CM | POA: Insufficient documentation

## 2017-03-07 LAB — I-STAT CHEM 8, ED
BUN: 28 mg/dL — ABNORMAL HIGH (ref 6–20)
Calcium, Ion: 1.03 mmol/L — ABNORMAL LOW (ref 1.15–1.40)
Chloride: 105 mmol/L (ref 101–111)
Creatinine, Ser: 1.5 mg/dL — ABNORMAL HIGH (ref 0.61–1.24)
Glucose, Bld: 319 mg/dL — ABNORMAL HIGH (ref 65–99)
HCT: 44 % (ref 39.0–52.0)
Hemoglobin: 15 g/dL (ref 13.0–17.0)
Potassium: 4.1 mmol/L (ref 3.5–5.1)
Sodium: 140 mmol/L (ref 135–145)
TCO2: 25 mmol/L (ref 22–32)

## 2017-03-07 LAB — HEPATIC FUNCTION PANEL
ALT: 12 U/L — ABNORMAL LOW (ref 17–63)
AST: 23 U/L (ref 15–41)
Albumin: 3.4 g/dL — ABNORMAL LOW (ref 3.5–5.0)
Alkaline Phosphatase: 119 U/L (ref 38–126)
Bilirubin, Direct: 0.2 mg/dL (ref 0.1–0.5)
Indirect Bilirubin: 0.4 mg/dL (ref 0.3–0.9)
Total Bilirubin: 0.6 mg/dL (ref 0.3–1.2)
Total Protein: 8.2 g/dL — ABNORMAL HIGH (ref 6.5–8.1)

## 2017-03-07 LAB — URINALYSIS, ROUTINE W REFLEX MICROSCOPIC
Bacteria, UA: NONE SEEN
Bilirubin Urine: NEGATIVE
Glucose, UA: >=500 mg/dL — AB
Ketones, ur: 5 mg/dL — AB
Leukocytes, UA: NEGATIVE
Nitrite: NEGATIVE
Protein, ur: 100 mg/dL — AB
Specific Gravity, Urine: 1.015 (ref 1.005–1.030)
Squamous Epithelial / LPF: NONE SEEN
pH: 5 (ref 5.0–8.0)

## 2017-03-07 LAB — BASIC METABOLIC PANEL
Anion gap: 8 (ref 5–15)
BUN: 19 mg/dL (ref 6–20)
CO2: 23 mmol/L (ref 22–32)
Calcium: 8.3 mg/dL — ABNORMAL LOW (ref 8.9–10.3)
Chloride: 105 mmol/L (ref 101–111)
Creatinine, Ser: 1.62 mg/dL — ABNORMAL HIGH (ref 0.61–1.24)
GFR calc Af Amer: 49 mL/min — ABNORMAL LOW (ref >60)
GFR calc non Af Amer: 42 mL/min — ABNORMAL LOW (ref >60)
Glucose, Bld: 308 mg/dL — ABNORMAL HIGH (ref 65–99)
Potassium: 3.9 mmol/L (ref 3.5–5.1)
Sodium: 136 mmol/L (ref 135–145)

## 2017-03-07 LAB — CBC
HCT: 41 % (ref 39.0–52.0)
Hemoglobin: 13.9 g/dL (ref 13.0–17.0)
MCH: 28.4 pg (ref 26.0–34.0)
MCHC: 33.9 g/dL (ref 30.0–36.0)
MCV: 83.7 fL (ref 78.0–100.0)
Platelets: 263 10*3/uL (ref 150–400)
RBC: 4.9 MIL/uL (ref 4.22–5.81)
RDW: 13.5 % (ref 11.5–15.5)
WBC: 17.8 10*3/uL — ABNORMAL HIGH (ref 4.0–10.5)

## 2017-03-07 LAB — RAPID URINE DRUG SCREEN, HOSP PERFORMED
Amphetamines: NOT DETECTED
Barbiturates: NOT DETECTED
Benzodiazepines: NOT DETECTED
Cocaine: POSITIVE — AB
Opiates: NOT DETECTED
Tetrahydrocannabinol: NOT DETECTED

## 2017-03-07 LAB — ETHANOL: Alcohol, Ethyl (B): <10 mg/dL (ref <10)

## 2017-03-07 LAB — CBG MONITORING, ED
Glucose-Capillary: 264 mg/dL — ABNORMAL HIGH (ref 65–99)
Glucose-Capillary: 269 mg/dL — ABNORMAL HIGH (ref 65–99)

## 2017-03-07 MED ORDER — HYDRALAZINE HCL 20 MG/ML IJ SOLN
10.0000 mg | Freq: Once | INTRAMUSCULAR | Status: AC
  Administered 2017-03-07: 10 mg via INTRAVENOUS

## 2017-03-07 MED ORDER — HYDROCHLOROTHIAZIDE 25 MG PO TABS
25.0000 mg | ORAL_TABLET | Freq: Once | ORAL | Status: AC
  Administered 2017-03-07: 25 mg via ORAL
  Filled 2017-03-07: qty 1

## 2017-03-07 MED ORDER — HYDRALAZINE HCL 20 MG/ML IJ SOLN
5.0000 mg | Freq: Once | INTRAMUSCULAR | Status: DC
  Filled 2017-03-07: qty 1

## 2017-03-07 MED ORDER — TRAMADOL HCL 50 MG PO TABS: 50.0000 mg | ORAL_TABLET | Freq: Four times a day (QID) | ORAL | 0 refills | Status: DC | PRN

## 2017-03-07 MED ORDER — ONDANSETRON 4 MG PO TBDP: 4.0000 mg | ORAL_TABLET | Freq: Three times a day (TID) | ORAL | 0 refills | Status: DC | PRN

## 2017-03-07 MED ORDER — SODIUM CHLORIDE 0.9 % IV BOLUS (SEPSIS)
1000.0000 mL | Freq: Once | INTRAVENOUS | Status: AC
  Administered 2017-03-07: 1000 mL via INTRAVENOUS

## 2017-03-07 MED ORDER — NALOXONE HCL 2 MG/2ML IJ SOSY
1.0000 mg | PREFILLED_SYRINGE | Freq: Once | INTRAMUSCULAR | Status: AC
Start: 2017-03-07 — End: 2017-03-07
  Administered 2017-03-07: 1 mg via INTRAVENOUS
  Filled 2017-03-07: qty 2

## 2017-03-07 MED ORDER — HYDROCODONE-ACETAMINOPHEN 5-325 MG PO TABS: 2.0000 | ORAL_TABLET | Freq: Once | ORAL | Status: DC

## 2017-03-07 MED ORDER — TRAMADOL HCL 50 MG PO TABS
50.0000 mg | ORAL_TABLET | Freq: Once | ORAL | Status: AC
  Administered 2017-03-07: 50 mg via ORAL
  Filled 2017-03-07: qty 1

## 2017-03-07 NOTE — ED Notes (Signed)
Pt resting on right side. Remains alert with stimulation but otherwise lethargic.

## 2017-03-07 NOTE — ED Provider Notes (Signed)
66 year old male who was sent here for altered mental status in setting of cocaine and possible additional substance use at homecoming party.  On arrival, the patient was reportedly very confused and altered.  CT head was obtained and is negative.  Lab work is consistent with mild dehydration and patient has been given fluids.  UDS positive for cocaine.  Patient has been increasingly awake and otherwise denies complaints per sign out.  Patient is currently attempting p.o. challenge with plan to discharge if able to tolerate p.o.  On reassessment, the patient is able to tolerate p.o.  He does remain markedly hypertensive, however.  He reports that he has been increasingly hypertensive at home.  This may be contributing to his headache.  He does state he has been adherent with his medications.  Of note, he is no longer taking metoprolol which is good in the setting of his cocaine use.  Will give him his p.o. antihypertensives.  Denies any current chest pain or other signs to suggest hypertensive emergency.  Of note, he also states that he is hypertensive because he is in pain from his right great toe.  He states he hit it "the other night."  On my exam, he is significant swelling to the distal phalanx of the great toe.  Will obtain plain films.  The following antihypertensive and pain control, patient blood pressure is improved.  He does have displaced fractures of the middle and distal phalanx of the great toe.  Will place him in a walking boot given great toe involvement, refer to orthopedics, and discharged with outpatient follow-up.  We discussed substance abuse and offered resources.  Patient is otherwise [place] dynamically stable and neurologically intact on repeat exams.  His abdomen is completely soft and nontender and nondistended.  He has no symptoms to suggest stroke with serial normal neurological exams here.  This note was prepared with assistance of Systems analyst. Occasional  wrong-word or sound-a-like substitutions may have occurred due to the inherent limitations of voice recognition software.    Duffy Bruce, MD 03/07/17 2110

## 2017-03-07 NOTE — ED Notes (Signed)
Ortho tech at bedside 

## 2017-03-07 NOTE — ED Triage Notes (Addendum)
Pt arrives EMS from [employer] tailgating where he c/o n/v for last 2 hour after eating suusage. Pt states took long acting insulin this am. Pt very somnolent and slow to respond to questions.

## 2017-03-07 NOTE — Progress Notes (Signed)
Orthopedic Tech Progress Note Patient Details:  Melvin Mcclure Dec 14, 1950 [id number]  Ortho Devices Type of Ortho Device: Crutches, CAM walker Ortho Device/Splint Location: rle Ortho Device/Splint Interventions: Ordered, Application, Adjustment   Karolee Stamps 03/07/2017, 11:28 PM

## 2017-03-07 NOTE — ED Notes (Addendum)
Pt sitting up at bedside to void. Request I step out. Pt given PO fluids.

## 2017-03-07 NOTE — ED Notes (Signed)
Pt. Has been given discharge paperwork. Paged Ortho tech and was told it will be a 20 minute wait until he can be here. Will continue to update.

## 2017-03-07 NOTE — ED Notes (Signed)
Ortho Tech paged.

## 2017-03-07 NOTE — ED Notes (Signed)
Pt given sandwhich. Wife remains at bedside.

## 2017-03-07 NOTE — ED Notes (Signed)
No change  to narcan. Continues to require stimulation to remain awake.

## 2017-03-07 NOTE — ED Provider Notes (Signed)
[facility] EMERGENCY DEPARTMENT Provider Note   CSN: [id number] Arrival date & time: 03/07/17  1228    Level 5 caveat due to altered mental status. History   Chief Complaint Chief Complaint  Patient presents with  . Emesis  . Weakness  . Near Syncope    HPI Melvin Mcclure is a 66 y.o. male.  HPI Patient brought after tailgating.  Has decreased mental status.  Reportedly has had nausea and vomiting.  He is a diabetic and took his insulin this morning.  While talking to him he will fall asleep.  He dropped the phone while he was talking to his wife because he was somnolent.  He does however wake back up to normal.  States he has been doing well.  No headaches.  States he just has had episodes of nausea and feels sleepy.  Denies any drug use.  Denies any alcohol intake.  Denies any chest pain. Past Medical History:  Diagnosis Date  . Diabetes mellitus without complication ([place])   . Hyperlipidemia   . Hypertension   . Perforating neurotrophic ulcer of foot ([place])     There are no active problems to display for this patient.   Past Surgical History:  Procedure Laterality Date  . ACHILLES TENDON REPAIR    . CYST EXCISION         Home Medications    Prior to Admission medications   Medication Sig Start Date End Date Taking? Authorizing Provider  amLODipine (NORVASC) 10 MG tablet Take 10 mg by mouth daily.   Yes [provider]  aspirin 81 MG tablet Take 81 mg by mouth daily.   Yes [provider]  gabapentin (NEURONTIN) 300 MG capsule TAKE ONE CAPSULE BY MOUTH THREE TIMES DAILY 03/13/16  Yes Weber, Sarah L, PA-C  hydrochlorothiazide (HYDRODIURIL) 25 MG tablet Take 25 mg by mouth daily. 02/04/17  Yes [provider]  insulin aspart (NOVOLOG) 100 UNIT/ML injection Inject 4-8 Units into the skin 3 (three) times daily before meals. Per sliding scale   Yes [provider]  insulin glargine (LANTUS) 100 UNIT/ML injection  Inject 20 Units into the skin daily.    Yes [provider]  metoprolol (LOPRESSOR) 100 MG tablet  04/26/15  Yes [provider]  cyclobenzaprine (FLEXERIL) 10 MG tablet Take 1 tablet (10 mg total) by mouth at bedtime. Patient not taking: Reported on 03/07/2017 07/21/15   Leandrew Koyanagi, MD  HYDROcodone-acetaminophen [facility]) 5-325 MG tablet 1-2 at bedtime Patient not taking: Reported on 03/07/2017 07/21/15   Leandrew Koyanagi, MD  meloxicam (MOBIC) 15 MG tablet Take 1 tablet (15 mg total) by mouth daily. Patient not taking: Reported on 03/07/2017 07/21/15   Leandrew Koyanagi, MD  ondansetron (ZOFRAN-ODT) 4 MG disintegrating tablet Take 1 tablet (4 mg total) by mouth every 8 (eight) hours as needed for nausea or vomiting. 03/07/17   Davonna Belling, MD    Family History History reviewed. No pertinent family history.  Social History Social History  Substance Use Topics  . Smoking status: Never Smoker  . Smokeless tobacco: Never Used  . Alcohol use No     Allergies   Patient has no known allergies.   Review of Systems Review of Systems  Unable to perform ROS: Mental status change  Constitutional: Negative for fever.  Respiratory: Negative for shortness of breath.   Gastrointestinal: Positive for nausea and vomiting.  Neurological: Negative for light-headedness.  Psychiatric/Behavioral: Positive for confusion.  Physical Exam Updated Vital Signs BP (!) 182/73   Pulse 80   Temp 98 F (36.7 C) (Oral)   Resp 15   Ht 5\' 10"  (1.778 m)   Wt 93 kg (205 lb)   SpO2 97%   BMI 29.41 kg/m   Physical Exam  Constitutional: He appears well-developed.  HENT:  Head: Normocephalic and atraumatic.  Eyes: EOM are normal.  Pupils are somewhat constricted  Neck: Neck supple.  Cardiovascular: Normal rate.   Pulmonary/Chest: Effort normal.  Abdominal: Soft. There is no tenderness.  Musculoskeletal: He exhibits no edema.  Neurological:  Patient is somewhat  somnolent.  Breathing spontaneously.  Will arouse to stimulation but initially without stimulation will decrease again.  Moving all extremities equally.  Appears able to make new memories also.  Skin: Skin is warm.     ED Treatments / Results  Labs (all labs ordered are listed, but only abnormal results are displayed) Labs Reviewed  BASIC METABOLIC PANEL - Abnormal; Notable for the following:       Result Value   Glucose, Bld 308 (*)    Creatinine, Ser 1.62 (*)    Calcium 8.3 (*)    GFR calc non Af Amer 42 (*)    GFR calc Af Amer 49 (*)    All other components within normal limits  CBC - Abnormal; Notable for the following:    WBC 17.8 (*)    All other components within normal limits  URINALYSIS, ROUTINE W REFLEX MICROSCOPIC - Abnormal; Notable for the following:    Glucose, UA >=500 (*)    Hgb urine dipstick MODERATE (*)    Ketones, ur 5 (*)    Protein, ur 100 (*)    All other components within normal limits  RAPID URINE DRUG SCREEN, HOSP PERFORMED - Abnormal; Notable for the following:    Cocaine POSITIVE (*)    All other components within normal limits  HEPATIC FUNCTION PANEL - Abnormal; Notable for the following:    Total Protein 8.2 (*)    Albumin 3.4 (*)    ALT 12 (*)    All other components within normal limits  CBG MONITORING, ED - Abnormal; Notable for the following:    Glucose-Capillary 264 (*)    All other components within normal limits  I-STAT CHEM 8, ED - Abnormal; Notable for the following:    BUN 28 (*)    Creatinine, Ser 1.50 (*)    Glucose, Bld 319 (*)    Calcium, Ion 1.03 (*)    All other components within normal limits  ETHANOL    EKG  EKG Interpretation  Date/Time:  Saturday March 07 2017 12:39:18 EDT Ventricular Rate:  75 PR Interval:    QRS Duration: 144 QT Interval:  479 QTC Calculation: 536 R Axis:   -7 Text Interpretation:  Sinus rhythm Ventricular premature complex Probable left atrial enlargement Right bundle branch block LVH with  IVCD and secondary repol abnrm Prolonged QT interval No significant change since last tracing Reconfirmed by Davonna Belling [phone]) on 03/07/2017 4:21:32 PM       Radiology Ct Head Wo Contrast  Result Date: 03/07/2017 CLINICAL DATA:  66 year old male with altered level of consciousness. EXAM: CT HEAD WITHOUT CONTRAST TECHNIQUE: Contiguous axial images were obtained from the base of the skull through the vertex without intravenous contrast. COMPARISON:  None. FINDINGS: Brain: No evidence of acute infarction, hemorrhage, hydrocephalus, extra-axial collection or mass lesion/mass effect. Vascular: No hyperdense vessel or unexpected calcification. Skull: Normal. Negative for  fracture or focal lesion. Sinuses/Orbits: No acute finding. Other: None. IMPRESSION: Unremarkable CT evaluation of the brain. Electronically Signed   By: Kristopher Oppenheim M.D.   On: 03/07/2017 13:31    Procedures Procedures (including critical care time)  Medications Ordered in ED Medications  naloxone (NARCAN) injection 1 mg (1 mg Intravenous Given 03/07/17 1300)  sodium chloride 0.9 % bolus 1,000 mL (0 mLs Intravenous Stopped 03/07/17 1624)     Initial Impression / Assessment and Plan / ED Course  I have reviewed the triage vital signs and the nursing notes.  Pertinent labs & imaging results that were available during my care of the patient were reviewed by me and considered in my medical decision making (see chart for details).     Patient brought in for mental status changes.  Has had some nausea and vomiting.  Mild hyperglycemia at around 300.  Fluids given.  However he is somewhat somnolent without stimulation.  He does awake appropriately with stimulation.  Denies any drug use labs are overall reassuring but cocaine did show up in the urine drug screen.  Patient denies drug use still.  Head CT done and reassuring.  Initially Narcan given with no real change.  Mental status is improved somewhat compared to  prior.  Now discussed with patient and admits that he was "exposed" to some cocaine a few days ago.  States it has these nausea and vomiting episodes in the past.  Will attempt oral intake and if he does well with that likely be able to discharge home.  Care turned over to Dr. Ellender Hose Final Clinical Impressions(s) / ED Diagnoses   Final diagnoses:  Non-intractable vomiting with nausea, unspecified vomiting type  Encephalopathy    New Prescriptions New Prescriptions   ONDANSETRON (ZOFRAN-ODT) 4 MG DISINTEGRATING TABLET    Take 1 tablet (4 mg total) by mouth every 8 (eight) hours as needed for nausea or vomiting.     Davonna Belling, MD 03/07/17 [phone]

## 2017-03-08 ENCOUNTER — Telehealth: Payer: Self-pay | Admitting: *Deleted

## 2017-03-08 ENCOUNTER — Encounter: Payer: Self-pay | Admitting: *Deleted

## 2017-03-08 NOTE — Telephone Encounter (Signed)
Repeated phone calls to CM office re: No Rx for abx for Elevated WBC during EDV 03/07/2017. In reviewing Chart EDP made no mention of intent to order ABX, which I shared with pts wife. He did order Zofran and Ultram, which she shared she had hard scripts for, at least the Ultram. CM shared the policy of the CM and ED that no re issuing of Rx could be done after discharge. She insisted I LM for discharging MD, Duffy Bruce which I have done, as she is an Therapist, sports x 40 yrs. No further CM needs at this time.

## 2017-03-08 NOTE — Care Management Note (Signed)
Received Message from Provider: Hi, I spoke with the patient about it but I guess he forgot. I was debating covering him for cellulitis if there was not a fracture to explain his swelling. He ended up having a pretty severe toe fracture which explains his swelling and the degree of pain. I didn't see any skin breakdown so given that his dehydration and pain response could explain his WBC elevation, I didn't feel like antibiotics were indicated based on the x Ray. He'll need to watch the area for skin breakdown and if she sees any new breakdown today I'm happy to call something in, but at this point I'd just keep an eye on it. If she is concerned about it, you can call in something. If he does not have a penicillin or cephalosporin allergy, keflex 500 TID x 7 days is fine. If he does, I'd go with Doxy 100 BID x 7 days. Hope this helps! Thanks   LM for the patient that I would call in Zofran which she declared she never received. Called [facility] at [facility] @ [phone]. Where they confirmed Zofran and Ultram had been filled. CM will sign off for now but will be available should additional discharge needs arise or disposition change.

## 2017-04-01 ENCOUNTER — Encounter: Payer: Self-pay | Admitting: Podiatry

## 2017-04-01 ENCOUNTER — Ambulatory Visit (INDEPENDENT_AMBULATORY_CARE_PROVIDER_SITE_OTHER): Payer: Medicare Other

## 2017-04-01 ENCOUNTER — Ambulatory Visit (INDEPENDENT_AMBULATORY_CARE_PROVIDER_SITE_OTHER): Payer: Medicare Other | Admitting: Podiatry

## 2017-04-01 VITALS — BP 142/90 | HR 89 | Resp 16

## 2017-04-01 DIAGNOSIS — S92421A Displaced fracture of distal phalanx of right great toe, initial encounter for closed fracture: Secondary | ICD-10-CM | POA: Diagnosis not present

## 2017-04-01 DIAGNOSIS — S99921A Unspecified injury of right foot, initial encounter: Secondary | ICD-10-CM

## 2017-04-01 NOTE — Progress Notes (Signed)
Subjective:    Patient ID: Melvin Mcclure, male   DOB: 66 y.o.   MRN: [id number]   HPI patient presents with displaced fracture of the right big toe and states he was in a boot and is now shoe and it's feeling some better but he wanted to make sure everything was okay as he does have long-term diabetes    Review of Systems  All other systems reviewed and are negative.       Objective:  Physical Exam  Constitutional: He appears well-developed and well-nourished.  Cardiovascular: Intact distal pulses.  Musculoskeletal: Normal range of motion.  Neurological: He is alert.  Skin: Skin is warm.  Nursing note and vitals reviewed.  neurovascular status was mildly diminished bilateral but intact with patient noted to have edema of the right hallux with pain at the interphalangeal joint of a moderate nature. Patient is found to have good digital perfusion and is well oriented 3     Assessment:   Fracture of the right hallux with inflammatory changes and history of immobilization      Plan:    H&P condition reviewed and recommended continued immobilization and the fact this may require ultimate fusion of the interphalangeal joint if it were to become arthritic. Reviewed that finding with patient and caregiver  X-rays indicate significant fracture of the proximal phalanx with joint involvement

## 2017-04-01 NOTE — Progress Notes (Signed)
   Subjective:    Patient ID: Melvin Mcclure, male    DOB: 10/01/1950, 66 y.o.   MRN: [id number]  HPI    Review of Systems  All other systems reviewed and are negative.      Objective:   Physical Exam        Assessment & Plan:

## 2017-11-20 DIAGNOSIS — Z125 Encounter for screening for malignant neoplasm of prostate: Secondary | ICD-10-CM | POA: Diagnosis not present

## 2017-11-20 DIAGNOSIS — E1165 Type 2 diabetes mellitus with hyperglycemia: Secondary | ICD-10-CM | POA: Diagnosis not present

## 2017-11-20 DIAGNOSIS — K5289 Other specified noninfective gastroenteritis and colitis: Secondary | ICD-10-CM | POA: Diagnosis not present

## 2017-11-20 DIAGNOSIS — I10 Essential (primary) hypertension: Secondary | ICD-10-CM | POA: Diagnosis not present

## 2017-11-20 DIAGNOSIS — Z0000 Encounter for general adult medical examination without abnormal findings: Secondary | ICD-10-CM | POA: Diagnosis not present

## 2017-12-25 DIAGNOSIS — E1121 Type 2 diabetes mellitus with diabetic nephropathy: Secondary | ICD-10-CM | POA: Diagnosis not present

## 2017-12-25 DIAGNOSIS — I10 Essential (primary) hypertension: Secondary | ICD-10-CM | POA: Diagnosis not present

## 2017-12-25 DIAGNOSIS — N183 Chronic kidney disease, stage 3 (moderate): Secondary | ICD-10-CM | POA: Diagnosis not present

## 2017-12-30 DIAGNOSIS — R197 Diarrhea, unspecified: Secondary | ICD-10-CM | POA: Diagnosis not present

## 2017-12-30 DIAGNOSIS — I10 Essential (primary) hypertension: Secondary | ICD-10-CM | POA: Diagnosis not present

## 2017-12-30 DIAGNOSIS — E1165 Type 2 diabetes mellitus with hyperglycemia: Secondary | ICD-10-CM | POA: Diagnosis not present

## 2018-01-07 DIAGNOSIS — K5730 Diverticulosis of large intestine without perforation or abscess without bleeding: Secondary | ICD-10-CM | POA: Diagnosis not present

## 2018-01-07 DIAGNOSIS — K2970 Gastritis, unspecified, without bleeding: Secondary | ICD-10-CM | POA: Diagnosis not present

## 2018-01-07 DIAGNOSIS — D132 Benign neoplasm of duodenum: Secondary | ICD-10-CM | POA: Diagnosis not present

## 2018-01-07 DIAGNOSIS — K529 Noninfective gastroenteritis and colitis, unspecified: Secondary | ICD-10-CM | POA: Diagnosis not present

## 2018-01-07 DIAGNOSIS — K259 Gastric ulcer, unspecified as acute or chronic, without hemorrhage or perforation: Secondary | ICD-10-CM | POA: Diagnosis not present

## 2018-01-07 DIAGNOSIS — K317 Polyp of stomach and duodenum: Secondary | ICD-10-CM | POA: Diagnosis not present

## 2018-01-07 DIAGNOSIS — R197 Diarrhea, unspecified: Secondary | ICD-10-CM | POA: Diagnosis not present

## 2018-01-07 DIAGNOSIS — I890 Lymphedema, not elsewhere classified: Secondary | ICD-10-CM | POA: Diagnosis not present

## 2018-01-07 DIAGNOSIS — K319 Disease of stomach and duodenum, unspecified: Secondary | ICD-10-CM | POA: Diagnosis not present

## 2018-02-04 DIAGNOSIS — N183 Chronic kidney disease, stage 3 (moderate): Secondary | ICD-10-CM | POA: Diagnosis not present

## 2018-02-04 DIAGNOSIS — I10 Essential (primary) hypertension: Secondary | ICD-10-CM | POA: Diagnosis not present

## 2018-02-04 DIAGNOSIS — R42 Dizziness and giddiness: Secondary | ICD-10-CM | POA: Diagnosis not present

## 2018-02-04 DIAGNOSIS — E1142 Type 2 diabetes mellitus with diabetic polyneuropathy: Secondary | ICD-10-CM | POA: Diagnosis not present

## 2018-02-22 DIAGNOSIS — R197 Diarrhea, unspecified: Secondary | ICD-10-CM | POA: Diagnosis not present

## 2018-03-18 DIAGNOSIS — N183 Chronic kidney disease, stage 3 (moderate): Secondary | ICD-10-CM | POA: Diagnosis not present

## 2018-03-18 DIAGNOSIS — H8113 Benign paroxysmal vertigo, bilateral: Secondary | ICD-10-CM | POA: Diagnosis not present

## 2018-03-18 DIAGNOSIS — I10 Essential (primary) hypertension: Secondary | ICD-10-CM | POA: Diagnosis not present

## 2018-03-18 DIAGNOSIS — E1165 Type 2 diabetes mellitus with hyperglycemia: Secondary | ICD-10-CM | POA: Diagnosis not present

## 2018-04-13 DIAGNOSIS — I129 Hypertensive chronic kidney disease with stage 1 through stage 4 chronic kidney disease, or unspecified chronic kidney disease: Secondary | ICD-10-CM | POA: Diagnosis not present

## 2018-04-13 DIAGNOSIS — H8111 Benign paroxysmal vertigo, right ear: Secondary | ICD-10-CM | POA: Diagnosis not present

## 2018-04-13 DIAGNOSIS — N183 Chronic kidney disease, stage 3 (moderate): Secondary | ICD-10-CM | POA: Diagnosis not present

## 2018-04-13 DIAGNOSIS — R809 Proteinuria, unspecified: Secondary | ICD-10-CM | POA: Diagnosis not present

## 2018-04-13 DIAGNOSIS — R42 Dizziness and giddiness: Secondary | ICD-10-CM | POA: Diagnosis not present

## 2018-04-13 DIAGNOSIS — R319 Hematuria, unspecified: Secondary | ICD-10-CM | POA: Diagnosis not present

## 2018-04-13 DIAGNOSIS — E1122 Type 2 diabetes mellitus with diabetic chronic kidney disease: Secondary | ICD-10-CM | POA: Diagnosis not present

## 2018-04-14 ENCOUNTER — Other Ambulatory Visit: Payer: Self-pay | Admitting: Nephrology

## 2018-04-14 DIAGNOSIS — N183 Chronic kidney disease, stage 3 unspecified: Secondary | ICD-10-CM

## 2018-04-26 DIAGNOSIS — E1129 Type 2 diabetes mellitus with other diabetic kidney complication: Secondary | ICD-10-CM | POA: Diagnosis not present

## 2018-05-06 DIAGNOSIS — H8113 Benign paroxysmal vertigo, bilateral: Secondary | ICD-10-CM | POA: Diagnosis not present

## 2018-05-06 DIAGNOSIS — I10 Essential (primary) hypertension: Secondary | ICD-10-CM | POA: Diagnosis not present

## 2018-05-06 DIAGNOSIS — E1165 Type 2 diabetes mellitus with hyperglycemia: Secondary | ICD-10-CM | POA: Diagnosis not present

## 2018-05-06 DIAGNOSIS — N183 Chronic kidney disease, stage 3 (moderate): Secondary | ICD-10-CM | POA: Diagnosis not present

## 2018-05-13 ENCOUNTER — Other Ambulatory Visit: Payer: Medicare Other

## 2018-05-13 DIAGNOSIS — N183 Chronic kidney disease, stage 3 (moderate): Secondary | ICD-10-CM | POA: Diagnosis not present

## 2018-05-20 ENCOUNTER — Emergency Department (HOSPITAL_COMMUNITY)
Admission: EM | Admit: 2018-05-20 | Discharge: 2018-05-20 | Disposition: A | Discharge status: Home / Self Care | Payer: Medicare Other | Attending: Emergency Medicine | Admitting: Emergency Medicine

## 2018-05-20 ENCOUNTER — Encounter (HOSPITAL_COMMUNITY): Payer: Self-pay | Admitting: Emergency Medicine

## 2018-05-20 ENCOUNTER — Emergency Department (HOSPITAL_COMMUNITY): Payer: Medicare Other

## 2018-05-20 ENCOUNTER — Other Ambulatory Visit: Payer: Self-pay

## 2018-05-20 DIAGNOSIS — E119 Type 2 diabetes mellitus without complications: Secondary | ICD-10-CM | POA: Diagnosis not present

## 2018-05-20 DIAGNOSIS — R062 Wheezing: Secondary | ICD-10-CM | POA: Diagnosis not present

## 2018-05-20 DIAGNOSIS — I10 Essential (primary) hypertension: Secondary | ICD-10-CM | POA: Diagnosis not present

## 2018-05-20 DIAGNOSIS — E1165 Type 2 diabetes mellitus with hyperglycemia: Secondary | ICD-10-CM | POA: Diagnosis not present

## 2018-05-20 DIAGNOSIS — J181 Lobar pneumonia, unspecified organism: Secondary | ICD-10-CM | POA: Insufficient documentation

## 2018-05-20 DIAGNOSIS — Z79899 Other long term (current) drug therapy: Secondary | ICD-10-CM | POA: Insufficient documentation

## 2018-05-20 DIAGNOSIS — R0602 Shortness of breath: Secondary | ICD-10-CM | POA: Diagnosis not present

## 2018-05-20 DIAGNOSIS — J189 Pneumonia, unspecified organism: Secondary | ICD-10-CM | POA: Diagnosis not present

## 2018-05-20 DIAGNOSIS — R0981 Nasal congestion: Secondary | ICD-10-CM | POA: Diagnosis not present

## 2018-05-20 DIAGNOSIS — Z794 Long term (current) use of insulin: Secondary | ICD-10-CM | POA: Diagnosis not present

## 2018-05-20 LAB — COMPREHENSIVE METABOLIC PANEL
ALT: 16 U/L (ref 0–44)
AST: 20 U/L (ref 15–41)
Albumin: 3.4 g/dL — ABNORMAL LOW (ref 3.5–5.0)
Alkaline Phosphatase: 86 U/L (ref 38–126)
Anion gap: 8 (ref 5–15)
BUN: 23 mg/dL (ref 8–23)
CO2: 21 mmol/L — ABNORMAL LOW (ref 22–32)
Calcium: 7.6 mg/dL — ABNORMAL LOW (ref 8.9–10.3)
Chloride: 113 mmol/L — ABNORMAL HIGH (ref 98–111)
Creatinine, Ser: 2.22 mg/dL — ABNORMAL HIGH (ref 0.61–1.24)
GFR calc Af Amer: 34 mL/min — ABNORMAL LOW (ref >60)
GFR calc non Af Amer: 29 mL/min — ABNORMAL LOW (ref >60)
Glucose, Bld: 126 mg/dL — ABNORMAL HIGH (ref 70–99)
Potassium: 4.3 mmol/L (ref 3.5–5.1)
Sodium: 142 mmol/L (ref 135–145)
Total Bilirubin: 0.8 mg/dL (ref 0.3–1.2)
Total Protein: 8.2 g/dL — ABNORMAL HIGH (ref 6.5–8.1)

## 2018-05-20 LAB — CBC WITH DIFFERENTIAL/PLATELET
Abs Immature Granulocytes: 0.05 10*3/uL (ref 0.00–0.07)
Basophils Absolute: 0.1 10*3/uL (ref 0.0–0.1)
Basophils Relative: 1 %
Eosinophils Absolute: 0.3 10*3/uL (ref 0.0–0.5)
Eosinophils Relative: 3 %
HCT: 34.4 % — ABNORMAL LOW (ref 39.0–52.0)
Hemoglobin: 10.5 g/dL — ABNORMAL LOW (ref 13.0–17.0)
Immature Granulocytes: 1 %
Lymphocytes Relative: 26 %
Lymphs Abs: 2.2 10*3/uL (ref 0.7–4.0)
MCH: 25.6 pg — ABNORMAL LOW (ref 26.0–34.0)
MCHC: 30.5 g/dL (ref 30.0–36.0)
MCV: 83.9 fL (ref 80.0–100.0)
Monocytes Absolute: 0.6 10*3/uL (ref 0.1–1.0)
Monocytes Relative: 7 %
Neutro Abs: 5.3 10*3/uL (ref 1.7–7.7)
Neutrophils Relative %: 62 %
Platelets: 267 10*3/uL (ref 150–400)
RBC: 4.1 MIL/uL — ABNORMAL LOW (ref 4.22–5.81)
RDW: 16.4 % — ABNORMAL HIGH (ref 11.5–15.5)
WBC: 8.5 10*3/uL (ref 4.0–10.5)
nRBC: 0 % (ref 0.0–0.2)

## 2018-05-20 LAB — BRAIN NATRIURETIC PEPTIDE: B Natriuretic Peptide: 460.2 pg/mL — ABNORMAL HIGH (ref 0.0–100.0)

## 2018-05-20 LAB — I-STAT TROPONIN, ED: Troponin i, poc: 0.01 ng/mL (ref 0.00–0.08)

## 2018-05-20 MED ORDER — AZITHROMYCIN 250 MG PO TABS: 250.0000 mg | ORAL_TABLET | Freq: Every day | ORAL | 0 refills | Status: AC

## 2018-05-20 MED ORDER — SODIUM CHLORIDE 0.9 % IV SOLN
1.0000 g | Freq: Once | INTRAVENOUS | Status: AC
  Administered 2018-05-20: 1 g via INTRAVENOUS
  Filled 2018-05-20: qty 10

## 2018-05-20 MED ORDER — ALBUTEROL SULFATE (2.5 MG/3ML) 0.083% IN NEBU
5.0000 mg | INHALATION_SOLUTION | Freq: Once | RESPIRATORY_TRACT | Status: AC
  Administered 2018-05-20: 5 mg via RESPIRATORY_TRACT
  Filled 2018-05-20: qty 6

## 2018-05-20 MED ORDER — SODIUM CHLORIDE 0.9 % IV SOLN
500.0000 mg | Freq: Once | INTRAVENOUS | Status: AC
  Administered 2018-05-20: 500 mg via INTRAVENOUS
  Filled 2018-05-20: qty 500

## 2018-05-20 MED ORDER — CEFDINIR 300 MG PO CAPS: 300.0000 mg | ORAL_CAPSULE | Freq: Two times a day (BID) | ORAL | 0 refills | Status: DC

## 2018-05-20 MED ORDER — IPRATROPIUM BROMIDE 0.02 % IN SOLN
0.5000 mg | Freq: Once | RESPIRATORY_TRACT | Status: AC
  Administered 2018-05-20: 0.5 mg via RESPIRATORY_TRACT
  Filled 2018-05-20: qty 2.5

## 2018-05-20 NOTE — Discharge Instructions (Addendum)
It was our pleasure to provide your ER care today - we hope that you feel better.  Your xrays were read as showing right lower pneumonia. Take antibiotics as prescribed.  No heart failure was noted on xray, your bnp (a lab tests for CHF) was mildly elevated - continue hctz, limit salt intake, and follow up with your primary care doctor in the next couple days - discuss possible additional testing then.  Also have them recheck your blood pressure as it is high today.   Follow up with cardiologist in the next 1-2 weeks - call office to arrange appointment.   Return to ER if worse, new symptoms, increased difficulty breathing, chest pain, other concern.

## 2018-05-20 NOTE — ED Triage Notes (Signed)
Pt with shortness of breath and orthopnea. He is being sent by PCP for evaluation for CHF. He denies chest pain or swelling in extremities.

## 2018-05-20 NOTE — ED Provider Notes (Signed)
[facility] EMERGENCY DEPARTMENT Provider Note   CSN: [id number] Arrival date & time: 05/20/18  1437     History   Chief Complaint Chief Complaint  Patient presents with  . Shortness of Breath    HPI Melvin Mcclure is a 68 y.o. male.  Patient c/o sob for the past few days. Has felt congested/rattling/wheezing in chest. Symptoms gradual onset, constant, persistent, slowly worse in past dya. Denies sore throat, runny nose or cough. Felt warm at home, but no documented fevers. Denies increased leg swelling or pain. ?worse when lying flat. Denies chest pain or discomfort. States compliant w home meds, no recent change.   The history is provided by the patient and the spouse.  Shortness of Breath  Associated symptoms: no abdominal pain, no chest pain, no headaches, no neck pain, no rash, no sore throat and no vomiting     Past Medical History:  Diagnosis Date  . Diabetes mellitus without complication ([place])   . Hyperlipidemia   . Hypertension   . Perforating neurotrophic ulcer of foot ([place])     There are no active problems to display for this patient.   Past Surgical History:  Procedure Laterality Date  . ACHILLES TENDON REPAIR    . CYST EXCISION          Home Medications    Prior to Admission medications   Medication Sig Start Date End Date Taking? Authorizing Provider  amLODipine (NORVASC) 10 MG tablet Take 10 mg by mouth daily.    [provider]  aspirin 81 MG tablet Take 81 mg by mouth daily.    [provider]  cyclobenzaprine (FLEXERIL) 10 MG tablet Take 1 tablet (10 mg total) by mouth at bedtime. 07/21/15   Leandrew Koyanagi, MD  gabapentin (NEURONTIN) 300 MG capsule TAKE ONE CAPSULE BY MOUTH THREE TIMES DAILY 03/13/16   Gale Journey, Damaris Hippo, PA-C  hydrochlorothiazide (HYDRODIURIL) 25 MG tablet Take 25 mg by mouth daily. 02/04/17   [provider]  HYDROcodone-acetaminophen (NORCO) 5-325 MG tablet 1-2 at bedtime 07/21/15    Leandrew Koyanagi, MD  insulin aspart (NOVOLOG) 100 UNIT/ML injection Inject 4-8 Units into the skin 3 (three) times daily before meals. Per sliding scale    [provider]  insulin glargine (LANTUS) 100 UNIT/ML injection Inject 20 Units into the skin daily.     [provider]  meloxicam (MOBIC) 15 MG tablet Take 1 tablet (15 mg total) by mouth daily. 07/21/15   Leandrew Koyanagi, MD  metoprolol (LOPRESSOR) 100 MG tablet  04/26/15   [provider]  ondansetron (ZOFRAN-ODT) 4 MG disintegrating tablet Take 1 tablet (4 mg total) by mouth every 8 (eight) hours as needed for nausea or vomiting. 03/07/17   Davonna Belling, MD  traMADol (ULTRAM) 50 MG tablet Take 1 tablet (50 mg total) by mouth every 6 (six) hours as needed for severe pain. 03/07/17   Duffy Bruce, MD    Family History No family history on file.  Social History Social History   Tobacco Use  . Smoking status: Never Smoker  . Smokeless tobacco: Never Used  Substance Use Topics  . Alcohol use: No    Alcohol/week: 0.0 standard drinks  . Drug use: No     Allergies   Patient has no known allergies.   Review of Systems Review of Systems  Constitutional: Negative for chills.       Subjective fever.   HENT: Negative for sore throat.   Eyes:  Negative for redness.  Respiratory: Positive for shortness of breath.   Cardiovascular: Negative for chest pain and leg swelling.  Gastrointestinal: Negative for abdominal pain, diarrhea and vomiting.  Genitourinary: Negative for flank pain.  Musculoskeletal: Negative for back pain and neck pain.  Skin: Negative for rash.  Neurological: Negative for headaches.  Hematological: Does not bruise/bleed easily.  Psychiatric/Behavioral: Negative for confusion.     Physical Exam Updated Vital Signs BP (!) 160/74   Pulse 64   Temp 98.7 F (37.1 C) (Oral)   Resp 15   SpO2 94%   Physical Exam Vitals signs and nursing note reviewed.    Constitutional:      Appearance: Normal appearance. He is well-developed.  HENT:     Head: Atraumatic.     Nose: Nose normal.     Mouth/Throat:     Mouth: Mucous membranes are moist.     Pharynx: Oropharynx is clear.  Eyes:     General: No scleral icterus.    Conjunctiva/sclera: Conjunctivae normal.     Pupils: Pupils are equal, round, and reactive to light.  Neck:     Musculoskeletal: Normal range of motion and neck supple. No neck rigidity.     Trachea: No tracheal deviation.  Cardiovascular:     Rate and Rhythm: Normal rate and regular rhythm.     Pulses: Normal pulses.     Heart sounds: Normal heart sounds. No murmur. No friction rub. No gallop.   Pulmonary:     Effort: Pulmonary effort is normal. No accessory muscle usage or respiratory distress.     Breath sounds: Rales present.     Comments: RLL rales Abdominal:     General: Bowel sounds are normal. There is no distension.     Palpations: Abdomen is soft.     Tenderness: There is no abdominal tenderness. There is no guarding.  Genitourinary:    Comments: No cva tenderness. Musculoskeletal:        General: No swelling or tenderness.     Right lower leg: No edema.     Left lower leg: No edema.  Skin:    General: Skin is warm and dry.     Findings: No rash.  Neurological:     Mental Status: He is alert.     Comments: Alert, speech clear.   Psychiatric:        Mood and Affect: Mood normal.      ED Treatments / Results  Labs (all labs ordered are listed, but only abnormal results are displayed) Results for orders placed or performed during the hospital encounter of 05/20/18  CBC with Differential  Result Value Ref Range   WBC 8.5 4.0 - 10.5 K/uL   RBC 4.10 (L) 4.22 - 5.81 MIL/uL   Hemoglobin 10.5 (L) 13.0 - 17.0 g/dL   HCT 34.4 (L) 39.0 - 52.0 %   MCV 83.9 80.0 - 100.0 fL   MCH 25.6 (L) 26.0 - 34.0 pg   MCHC 30.5 30.0 - 36.0 g/dL   RDW 16.4 (H) 11.5 - 15.5 %   Platelets 267 150 - 400 K/uL   nRBC 0.0 0.0  - 0.2 %   Neutrophils Relative % 62 %   Neutro Abs 5.3 1.7 - 7.7 K/uL   Lymphocytes Relative 26 %   Lymphs Abs 2.2 0.7 - 4.0 K/uL   Monocytes Relative 7 %   Monocytes Absolute 0.6 0.1 - 1.0 K/uL   Eosinophils Relative 3 %   Eosinophils Absolute 0.3 0.0 -  0.5 K/uL   Basophils Relative 1 %   Basophils Absolute 0.1 0.0 - 0.1 K/uL   Immature Granulocytes 1 %   Abs Immature Granulocytes 0.05 0.00 - 0.07 K/uL  Comprehensive metabolic panel  Result Value Ref Range   Sodium 142 135 - 145 mmol/L   Potassium 4.3 3.5 - 5.1 mmol/L   Chloride 113 (H) 98 - 111 mmol/L   CO2 21 (L) 22 - 32 mmol/L   Glucose, Bld 126 (H) 70 - 99 mg/dL   BUN 23 8 - 23 mg/dL   Creatinine, Ser 2.22 (H) 0.61 - 1.24 mg/dL   Calcium 7.6 (L) 8.9 - 10.3 mg/dL   Total Protein 8.2 (H) 6.5 - 8.1 g/dL   Albumin 3.4 (L) 3.5 - 5.0 g/dL   AST 20 15 - 41 U/L   ALT 16 0 - 44 U/L   Alkaline Phosphatase 86 38 - 126 U/L   Total Bilirubin 0.8 0.3 - 1.2 mg/dL   GFR calc non Af Amer 29 (L) >60 mL/min   GFR calc Af Amer 34 (L) >60 mL/min   Anion gap 8 5 - 15  Brain natriuretic peptide  Result Value Ref Range   B Natriuretic Peptide 460.2 (H) 0.0 - 100.0 pg/mL   Dg Chest 2 View  Result Date: 05/20/2018 CLINICAL DATA:  Shortness of breath EXAM: CHEST - 2 VIEW COMPARISON:  09/25/2015 FINDINGS: Cardiac shadow is enlarged. Left lung is well aerated and clear. Right lung demonstrates evidence lower lobe with associated small. No bony noted. IMPRESSION: Right basilar pneumonia. Electronically Signed   By: Inez Catalina M.D.   On: 05/20/2018 15:30    EKG EKG Interpretation  Date/Time:  Thursday May 20 2018 14:58:53 EST Ventricular Rate:  63 PR Interval:  176 QRS Duration: 124 QT Interval:  504 QTC Calculation: 515 R Axis:   -6 Text Interpretation:  Normal sinus rhythm Right bundle branch block Nonspecific T wave abnormality No significant change since last tracing Confirmed by Lajean Saver [phone]) on 05/20/2018 7:36:59  PM   Radiology Dg Chest 2 View  Result Date: 05/20/2018 CLINICAL DATA:  Shortness of breath EXAM: CHEST - 2 VIEW COMPARISON:  09/25/2015 FINDINGS: Cardiac shadow is enlarged. Left lung is well aerated and clear. Right lung demonstrates evidence lower lobe with associated small. No bony noted. IMPRESSION: Right basilar pneumonia. Electronically Signed   By: Inez Catalina M.D.   On: 05/20/2018 15:30    Procedures Procedures (including critical care time)  Medications Ordered in ED Medications  cefTRIAXone (ROCEPHIN) 1 g in sodium chloride 0.9 % 100 mL IVPB (has no administration in time range)  azithromycin (ZITHROMAX) 500 mg in sodium chloride 0.9 % 250 mL IVPB (has no administration in time range)     Initial Impression / Assessment and Plan / ED Course  I have reviewed the triage vital signs and the nursing notes.  Pertinent labs & imaging results that were available during my care of the patient were reviewed by me and considered in my medical decision making (see chart for details).  Labs sent. Cxr.   Pt indicates sees [facility] for CRI. Last creatinines here from 2018 in 1.5-1.6 range.   Reviewed nursing notes and prior charts for additional history.   cxr reviewed - rll pna, which corresponds w findings on chest exam.   Rocephin and zithromax iv.   Will try albuterol neb.   Labs reviewed - cr elev 2.2. bnp mildly elevated. No signif leg edema.   Recheck,  ambulatory to bathroom, no resp difficulty. Pulse ox 96%.   Pt currently appears stable for d/c.   rec close pcp f/u.   Pt inquired re possible echo - will refer to cardiology outpt f/u and pcp f/u.   Final Clinical Impressions(s) / ED Diagnoses   Final diagnoses:  None    ED Discharge Orders    None       Lajean Saver, MD 05/20/18 2243

## 2018-05-28 ENCOUNTER — Other Ambulatory Visit: Payer: Self-pay

## 2018-05-28 ENCOUNTER — Emergency Department (HOSPITAL_COMMUNITY): Payer: Medicare Other

## 2018-05-28 ENCOUNTER — Encounter (HOSPITAL_COMMUNITY): Payer: Self-pay

## 2018-05-28 ENCOUNTER — Inpatient Hospital Stay (HOSPITAL_COMMUNITY)
Admission: EM | Admit: 2018-05-28 | Discharge: 2018-06-04 | DRG: 291 | Disposition: A | Discharge status: Home / Self Care | Payer: Medicare Other | Attending: Internal Medicine | Admitting: Internal Medicine

## 2018-05-28 DIAGNOSIS — I509 Heart failure, unspecified: Secondary | ICD-10-CM | POA: Diagnosis not present

## 2018-05-28 DIAGNOSIS — J189 Pneumonia, unspecified organism: Secondary | ICD-10-CM | POA: Diagnosis not present

## 2018-05-28 DIAGNOSIS — H9311 Tinnitus, right ear: Secondary | ICD-10-CM | POA: Diagnosis present

## 2018-05-28 DIAGNOSIS — N184 Chronic kidney disease, stage 4 (severe): Secondary | ICD-10-CM | POA: Diagnosis present

## 2018-05-28 DIAGNOSIS — Z794 Long term (current) use of insulin: Secondary | ICD-10-CM | POA: Diagnosis not present

## 2018-05-28 DIAGNOSIS — J9601 Acute respiratory failure with hypoxia: Secondary | ICD-10-CM | POA: Diagnosis present

## 2018-05-28 DIAGNOSIS — R42 Dizziness and giddiness: Secondary | ICD-10-CM | POA: Diagnosis not present

## 2018-05-28 DIAGNOSIS — E1122 Type 2 diabetes mellitus with diabetic chronic kidney disease: Secondary | ICD-10-CM | POA: Diagnosis present

## 2018-05-28 DIAGNOSIS — I4510 Unspecified right bundle-branch block: Secondary | ICD-10-CM | POA: Diagnosis not present

## 2018-05-28 DIAGNOSIS — R9431 Abnormal electrocardiogram [ECG] [EKG]: Secondary | ICD-10-CM | POA: Diagnosis not present

## 2018-05-28 DIAGNOSIS — R0602 Shortness of breath: Secondary | ICD-10-CM | POA: Diagnosis not present

## 2018-05-28 DIAGNOSIS — I110 Hypertensive heart disease with heart failure: Secondary | ICD-10-CM | POA: Diagnosis not present

## 2018-05-28 DIAGNOSIS — I5033 Acute on chronic diastolic (congestive) heart failure: Secondary | ICD-10-CM | POA: Diagnosis present

## 2018-05-28 DIAGNOSIS — Z79899 Other long term (current) drug therapy: Secondary | ICD-10-CM | POA: Diagnosis not present

## 2018-05-28 DIAGNOSIS — N179 Acute kidney failure, unspecified: Secondary | ICD-10-CM | POA: Diagnosis not present

## 2018-05-28 DIAGNOSIS — I5032 Chronic diastolic (congestive) heart failure: Secondary | ICD-10-CM

## 2018-05-28 DIAGNOSIS — Z23 Encounter for immunization: Secondary | ICD-10-CM | POA: Diagnosis not present

## 2018-05-28 DIAGNOSIS — L97509 Non-pressure chronic ulcer of other part of unspecified foot with unspecified severity: Secondary | ICD-10-CM | POA: Diagnosis present

## 2018-05-28 DIAGNOSIS — E785 Hyperlipidemia, unspecified: Secondary | ICD-10-CM | POA: Diagnosis present

## 2018-05-28 DIAGNOSIS — E1121 Type 2 diabetes mellitus with diabetic nephropathy: Secondary | ICD-10-CM | POA: Diagnosis not present

## 2018-05-28 DIAGNOSIS — Z833 Family history of diabetes mellitus: Secondary | ICD-10-CM

## 2018-05-28 DIAGNOSIS — I5031 Acute diastolic (congestive) heart failure: Secondary | ICD-10-CM

## 2018-05-28 DIAGNOSIS — N185 Chronic kidney disease, stage 5: Secondary | ICD-10-CM | POA: Diagnosis present

## 2018-05-28 DIAGNOSIS — I10 Essential (primary) hypertension: Secondary | ICD-10-CM | POA: Diagnosis not present

## 2018-05-28 DIAGNOSIS — Z7982 Long term (current) use of aspirin: Secondary | ICD-10-CM

## 2018-05-28 DIAGNOSIS — I361 Nonrheumatic tricuspid (valve) insufficiency: Secondary | ICD-10-CM | POA: Diagnosis not present

## 2018-05-28 DIAGNOSIS — I130 Hypertensive heart and chronic kidney disease with heart failure and stage 1 through stage 4 chronic kidney disease, or unspecified chronic kidney disease: Principal | ICD-10-CM | POA: Diagnosis present

## 2018-05-28 DIAGNOSIS — N183 Chronic kidney disease, stage 3 (moderate): Secondary | ICD-10-CM | POA: Diagnosis not present

## 2018-05-28 DIAGNOSIS — E119 Type 2 diabetes mellitus without complications: Secondary | ICD-10-CM | POA: Diagnosis present

## 2018-05-28 DIAGNOSIS — I370 Nonrheumatic pulmonary valve stenosis: Secondary | ICD-10-CM | POA: Diagnosis not present

## 2018-05-28 HISTORY — DX: Heart failure, unspecified: I50.9

## 2018-05-28 LAB — CBC
HCT: 36.2 % — ABNORMAL LOW (ref 39.0–52.0)
Hemoglobin: 10.8 g/dL — ABNORMAL LOW (ref 13.0–17.0)
MCH: 25.2 pg — ABNORMAL LOW (ref 26.0–34.0)
MCHC: 29.8 g/dL — ABNORMAL LOW (ref 30.0–36.0)
MCV: 84.4 fL (ref 80.0–100.0)
Platelets: 256 10*3/uL (ref 150–400)
RBC: 4.29 MIL/uL (ref 4.22–5.81)
RDW: 17.1 % — ABNORMAL HIGH (ref 11.5–15.5)
WBC: 10.4 10*3/uL (ref 4.0–10.5)
nRBC: 0.2 % (ref 0.0–0.2)

## 2018-05-28 LAB — HEPATIC FUNCTION PANEL
ALT: 16 U/L (ref 0–44)
AST: 23 U/L (ref 15–41)
Albumin: 3.5 g/dL (ref 3.5–5.0)
Alkaline Phosphatase: 86 U/L (ref 38–126)
Bilirubin, Direct: 0.1 mg/dL (ref 0.0–0.2)
Indirect Bilirubin: 0.9 mg/dL (ref 0.3–0.9)
Total Bilirubin: 1 mg/dL (ref 0.3–1.2)
Total Protein: 8.4 g/dL — ABNORMAL HIGH (ref 6.5–8.1)

## 2018-05-28 LAB — BASIC METABOLIC PANEL
Anion gap: 10 (ref 5–15)
BUN: 23 mg/dL (ref 8–23)
CO2: 21 mmol/L — ABNORMAL LOW (ref 22–32)
Calcium: 7.2 mg/dL — ABNORMAL LOW (ref 8.9–10.3)
Chloride: 110 mmol/L (ref 98–111)
Creatinine, Ser: 2.29 mg/dL — ABNORMAL HIGH (ref 0.61–1.24)
GFR calc Af Amer: 33 mL/min — ABNORMAL LOW (ref >60)
GFR calc non Af Amer: 28 mL/min — ABNORMAL LOW (ref >60)
Glucose, Bld: 120 mg/dL — ABNORMAL HIGH (ref 70–99)
Potassium: 4.1 mmol/L (ref 3.5–5.1)
Sodium: 141 mmol/L (ref 135–145)

## 2018-05-28 LAB — I-STAT TROPONIN, ED: Troponin i, poc: 0.01 ng/mL (ref 0.00–0.08)

## 2018-05-28 LAB — HEMOGLOBIN A1C
Hgb A1c MFr Bld: 6.2 % — ABNORMAL HIGH (ref 4.8–5.6)
Mean Plasma Glucose: 131.24 mg/dL

## 2018-05-28 LAB — TROPONIN I: Troponin I: <0.03 ng/mL (ref <0.03)

## 2018-05-28 LAB — GLUCOSE, CAPILLARY
Glucose-Capillary: 90 mg/dL (ref 70–99)
Glucose-Capillary: 119 mg/dL — ABNORMAL HIGH (ref 70–99)

## 2018-05-28 LAB — TSH: TSH: 2.399 u[IU]/mL (ref 0.350–4.500)

## 2018-05-28 LAB — BRAIN NATRIURETIC PEPTIDE: B Natriuretic Peptide: 534.6 pg/mL — ABNORMAL HIGH (ref 0.0–100.0)

## 2018-05-28 LAB — MAGNESIUM: Magnesium: 2.3 mg/dL (ref 1.7–2.4)

## 2018-05-28 MED ORDER — ASPIRIN EC 81 MG PO TBEC
81.0000 mg | DELAYED_RELEASE_TABLET | Freq: Every day | ORAL | Status: DC
  Administered 2018-05-29 – 2018-06-04 (×7): 81 mg via ORAL
  Filled 2018-05-28 (×7): qty 1

## 2018-05-28 MED ORDER — ONDANSETRON HCL 4 MG PO TABS: 4.0000 mg | ORAL_TABLET | Freq: Four times a day (QID) | ORAL | Status: DC | PRN

## 2018-05-28 MED ORDER — ACETAMINOPHEN 650 MG RE SUPP: 650.0000 mg | Freq: Four times a day (QID) | RECTAL | Status: DC | PRN

## 2018-05-28 MED ORDER — HEPARIN SODIUM (PORCINE) 5000 UNIT/ML IJ SOLN
5000.0000 [IU] | Freq: Three times a day (TID) | INTRAMUSCULAR | Status: DC
  Administered 2018-05-28 – 2018-06-04 (×19): 5000 [IU] via SUBCUTANEOUS
  Filled 2018-05-28 (×20): qty 1

## 2018-05-28 MED ORDER — FUROSEMIDE 10 MG/ML IJ SOLN
20.0000 mg | Freq: Once | INTRAMUSCULAR | Status: AC
  Administered 2018-05-28: 20 mg via INTRAVENOUS
  Filled 2018-05-28: qty 2

## 2018-05-28 MED ORDER — GABAPENTIN 300 MG PO CAPS
300.0000 mg | ORAL_CAPSULE | Freq: Every day | ORAL | Status: DC
  Administered 2018-05-29 – 2018-05-31 (×3): 300 mg via ORAL
  Filled 2018-05-28 (×3): qty 1

## 2018-05-28 MED ORDER — FUROSEMIDE 10 MG/ML IJ SOLN
20.0000 mg | Freq: Two times a day (BID) | INTRAMUSCULAR | Status: DC
  Administered 2018-05-29: 20 mg via INTRAVENOUS
  Filled 2018-05-28: qty 2

## 2018-05-28 MED ORDER — PNEUMOCOCCAL VAC POLYVALENT 25 MCG/0.5ML IJ INJ
0.5000 mL | INJECTION | INTRAMUSCULAR | Status: AC
  Administered 2018-06-02: 0.5 mL via INTRAMUSCULAR
  Filled 2018-05-28: qty 0.5

## 2018-05-28 MED ORDER — ACETAMINOPHEN 325 MG PO TABS
650.0000 mg | ORAL_TABLET | Freq: Four times a day (QID) | ORAL | Status: DC | PRN
  Administered 2018-06-02 (×2): 650 mg via ORAL
  Filled 2018-05-28 (×2): qty 2

## 2018-05-28 MED ORDER — INSULIN GLARGINE 100 UNIT/ML ~~LOC~~ SOLN
8.0000 [IU] | Freq: Every day | SUBCUTANEOUS | Status: DC
  Filled 2018-05-28: qty 0.08

## 2018-05-28 MED ORDER — ONDANSETRON HCL 4 MG/2ML IJ SOLN
4.0000 mg | Freq: Four times a day (QID) | INTRAMUSCULAR | Status: DC | PRN
  Administered 2018-05-31: 4 mg via INTRAVENOUS
  Filled 2018-05-28: qty 2

## 2018-05-28 NOTE — ED Notes (Signed)
Pts family member voiced concern over pts O2 sats. Checked on portable pulse ox and found to be 85%. Hassan Rowan, RN notified. Placed pt on 2L [place]. O2 sats 95% on 2L.

## 2018-05-28 NOTE — H&P (Addendum)
History and Physical    Melvin Mcclure KVQ:[id number] DOB: Jun 08, 1950 DOA: 05/28/2018  Referring MD/NP/PA: EDP PCP: Dr. Vista Lawman Patient coming from: Home  Chief Complaint: Shortness of breath  HPI: Melvin Mcclure is a 68 y.o. male with medical history significant of type 2 diabetes mellitus on insulin, stage III-IV chronic kidney disease, neuropathy was in his usual state of health until approximately 1 week ago, patient was seen in the emergency room for shortness of breath and diagnosed with pneumonia given question infiltrate noted on x-ray then discharged home on antibiotics but reported no improvement, progressively had worsening shortness of breath, orthopnea, dizziness and weakness.  He denies any fever, productive cough or congestion.  ED Course: Chest x-ray noted for notable for congestive heart failure, BNP in the 500 range, creatinine is 2.2  Review of Systems: As per HPI otherwise 14 point review of systems negative.   Past Medical History:  Diagnosis Date  . CHF (congestive heart failure) ([place]) 05/28/2018   dx 05/28/18  . Diabetes mellitus without complication ([place])   . Hyperlipidemia   . Hypertension   . Perforating neurotrophic ulcer of foot ([place])     Past Surgical History:  Procedure Laterality Date  . ACHILLES TENDON REPAIR    . CYST EXCISION       reports that he has never smoked. He has never used smokeless tobacco. He reports that he does not drink alcohol or use drugs.  No Known Allergies  No family history on file.   Prior to Admission medications   Medication Sig Start Date End Date Taking? Authorizing Provider  amLODipine (NORVASC) 10 MG tablet Take 10 mg by mouth daily.   Yes [provider]  aspirin 81 MG tablet Take 81 mg by mouth daily.   Yes [provider]  gabapentin (NEURONTIN) 300 MG capsule TAKE ONE CAPSULE BY MOUTH THREE TIMES DAILY Patient taking differently: Take 300 mg by mouth 3 (three) times daily.  03/13/16  Yes  Weber, Sarah L, PA-C  insulin aspart (NOVOLOG) 100 UNIT/ML injection Inject 4-8 Units into the skin 3 (three) times daily before meals. Per sliding scale   Yes [provider]  insulin glargine (LANTUS) 100 UNIT/ML injection Inject 20 Units into the skin daily.    Yes [provider]  losartan (COZAAR) 100 MG tablet Take 50 mg by mouth daily. 04/26/18  Yes [provider]  cefdinir (OMNICEF) 300 MG capsule Take 1 capsule (300 mg total) by mouth 2 (two) times daily. Patient not taking: Reported on 05/28/2018 05/20/18   Lajean Saver, MD  cyclobenzaprine (FLEXERIL) 10 MG tablet Take 1 tablet (10 mg total) by mouth at bedtime. Patient not taking: Reported on 05/28/2018 07/21/15   Leandrew Koyanagi, MD  HYDROcodone-acetaminophen [facility]) 5-325 MG tablet 1-2 at bedtime Patient not taking: Reported on 05/28/2018 07/21/15   Leandrew Koyanagi, MD  meloxicam (MOBIC) 15 MG tablet Take 1 tablet (15 mg total) by mouth daily. Patient not taking: Reported on 05/28/2018 07/21/15   Leandrew Koyanagi, MD  ondansetron (ZOFRAN-ODT) 4 MG disintegrating tablet Take 1 tablet (4 mg total) by mouth every 8 (eight) hours as needed for nausea or vomiting. Patient not taking: Reported on 05/28/2018 03/07/17   Davonna Belling, MD  traMADol (ULTRAM) 50 MG tablet Take 1 tablet (50 mg total) by mouth every 6 (six) hours as needed for severe pain. Patient not taking: Reported on 05/28/2018 03/07/17   Duffy Bruce, MD    Physical Exam: Vitals:   05/28/18  1600 05/28/18 1615 05/28/18 1630 05/28/18 1645  BP: (!) 162/89 (!) 158/92 (!) 156/86 (!) 156/87  Pulse: 67 68 66 66  Resp: 16 11 13 18   Temp:      TempSrc:      SpO2: 98% 99% 98% 97%      Constitutional: NAD, calm, comfortable, no distress Vitals:   05/28/18 1600 05/28/18 1615 05/28/18 1630 05/28/18 1645  BP: (!) 162/89 (!) 158/92 (!) 156/86 (!) 156/87  Pulse: 67 68 66 66  Resp: 16 11 13 18   Temp:      TempSrc:      SpO2: 98% 99% 98%  97%   Eyes: PERRL, lids and conjunctivae normal ENMT: Mucous membranes are moist.  Neck: normal, supple, + JVD Respiratory: Fine bibasilar crackles in both lower lobes Cardiovascular: Regular rate and rhythm, no murmurs / rubs / gallops Abdomen: soft, non tender, Bowel sounds positive.  Musculoskeletal: No joint deformity upper and lower extremities. Ext: 1+ edema bilaterally Skin: no rashes, lesions, ulcers.  Neurologic: CN 2-12 grossly intact. Sensation intact, DTR normal. Strength 5/5 in all 4.  Psychiatric: Normal judgment and insight. Alert and oriented x 3. Normal mood.   Labs on Admission: I have personally reviewed following labs and imaging studies  CBC: Recent Labs  Lab 05/28/18 1411  WBC 10.4  HGB 10.8*  HCT 36.2*  MCV 84.4  PLT 786   Basic Metabolic Panel: Recent Labs  Lab 05/28/18 1411 05/28/18 1525  NA 141  --   K 4.1  --   CL 110  --   CO2 21*  --   GLUCOSE 120*  --   BUN 23  --   CREATININE 2.29*  --   CALCIUM 7.2*  --   MG  --  2.3   GFR: CrCl cannot be calculated (Unknown ideal weight.). Liver Function Tests: Recent Labs  Lab 05/28/18 1531  AST 23  ALT 16  ALKPHOS 86  BILITOT 1.0  PROT 8.4*  ALBUMIN 3.5   No results for input(s): LIPASE, AMYLASE in the last 168 hours. No results for input(s): AMMONIA in the last 168 hours. Coagulation Profile: No results for input(s): INR, PROTIME in the last 168 hours. Cardiac Enzymes: No results for input(s): CKTOTAL, CKMB, CKMBINDEX, TROPONINI in the last 168 hours. BNP (last 3 results) No results for input(s): PROBNP in the last 8760 hours. HbA1C: No results for input(s): HGBA1C in the last 72 hours. CBG: No results for input(s): GLUCAP in the last 168 hours. Lipid Profile: No results for input(s): CHOL, HDL, LDLCALC, TRIG, CHOLHDL, LDLDIRECT in the last 72 hours. Thyroid Function Tests: No results for input(s): TSH, T4TOTAL, FREET4, T3FREE, THYROIDAB in the last 72 hours. Anemia Panel: No  results for input(s): VITAMINB12, FOLATE, FERRITIN, TIBC, IRON, RETICCTPCT in the last 72 hours. Urine analysis:    Component Value Date/Time   COLORURINE YELLOW 03/07/2017 1413   APPEARANCEUR CLEAR 03/07/2017 1413   LABSPEC 1.015 03/07/2017 1413   PHURINE 5.0 03/07/2017 1413   GLUCOSEU >=500 (A) 03/07/2017 1413   HGBUR MODERATE (A) 03/07/2017 1413   BILIRUBINUR NEGATIVE 03/07/2017 1413   KETONESUR 5 (A) 03/07/2017 1413   PROTEINUR 100 (A) 03/07/2017 1413   UROBILINOGEN 0.2 06/17/2010 1620   NITRITE NEGATIVE 03/07/2017 1413   LEUKOCYTESUR NEGATIVE 03/07/2017 1413   Sepsis Labs: @LABRCNTIP (procalcitonin:4,lacticidven:4) )No results found for this or any previous visit (from the past 240 hour(s)).   Radiological Exams on Admission: Dg Chest Portable 1 View  Result Date: 05/28/2018 CLINICAL  DATA:  Dyspnea.  Assess for congestive heart failure. EXAM: PORTABLE CHEST 1 VIEW COMPARISON:  May 20, 2018 FINDINGS: The heart size and mediastinal contours are stable. The heart size is enlarged. Right pleural effusion is noted. Consolidation of the right mid and lung base are noted. There is pulmonary edema. The visualized skeletal structures are stable. IMPRESSION: Congestive heart failure. Right pleural effusion. Patchy consolidation of right lung base at least in part due to atelectasis but superimposed pneumonia is not excluded. Electronically Signed   By: Abelardo Diesel M.D.   On: 05/28/2018 15:50    EKG: Independently reviewed his EKG, LVH, T wave inversion in inferior leads noted, and lateral leads, RBBB pattern also noted  Assessment/Plan  Acute CHF -Will admit to telemetry -IV Lasix 20 mg every 12 for now -Check 2D echocardiogram -We will need ischemia evaluation if his EF is low -Monitor creatinine closely with diuresis -Strict I's/O's, daily weights -Current telemetry, patient reports history of ongoing dizziness for several weeks now  Type 2 diabetes mellitus -On insulin,  CBGs have been running range, likely secondary to CKD -Resume Lantus at much lower dose of 8 units daily, sliding scale insulin, monitor -Check hemoglobin A1c  Chronic kidney disease stage IV -Patient reports recent creatinine in the 2.0 range, this is 2.2 now, possibly a component of hemodynamically related cardiorenal syndrome as well -If cardiorenal, may improve with diuresis, monitor creatinine and electrolytes closely  DVT prophylaxis: Subcutaneous heparin Code Status: Full code Family Communication: Discussed with wife at bedside that when he is talking about again Disposition Plan: Inpatient Consults called: None Admission status: Inpatient  Domenic Polite MD [facility]  05/28/2018, 5:10 PM

## 2018-05-28 NOTE — ED Notes (Signed)
Pt endorses being sent by pcp for CHF work up. Pt had PNA last week and finished antibiotics for that. Pt having worsening shob. No swelling or edema. Occasional dry cough. spo2 85% on RA after being placed in bed. Nasal Cannula in place now at 2L, spo2 back up to 95%.

## 2018-05-28 NOTE — ED Triage Notes (Signed)
Pt to ER for evaluation of persistently worsening shortness of breath after being diagnosed last week with pneumonia. States sit here by PCP for CHF workup. Pt tachypnic. Denies cp. Denies swelling in legs.

## 2018-05-28 NOTE — ED Provider Notes (Signed)
[facility] EMERGENCY DEPARTMENT Provider Note   CSN: [id number] Arrival date & time: 05/28/18  1357     History   Chief Complaint Chief Complaint  Patient presents with  . Shortness of Breath    HPI Melvin Mcclure is a 68 y.o. male.  HPI  Melvin Mcclure is a 68 y.o. male with PMH of DM, HLD, HTN, perforating neurotrophic ulcer of foot who presents with his wife who is a nurse at the bedside.  Who presents with worsening dyspnea, fatigue, and dry cough.  Patient reports he started noticing the symptoms about 10 to 12 days ago.  Was seen previously in the emergency department and diagnosed with a right lower lobe pneumonia.  Received Rocephin and azithromycin at that time and was discharged home on Omnicef and azithromycin.  Completed a 7-day course of Omnicef and 5-day azithromycin with no improvement.  Has had intermittent worsening of his dyspnea at home especially with exertion and has had sats in the 70s 80s on room air.  No history of oxygen requirement.  His shortness of breath is significantly worse with exertion and with lying flat.  Endorses discomfort with lying flat.  He has had some intermittent watery diarrhea without melena or hematochezia.  No nausea or vomiting.  No chest pain or pressure.  Saw his PCP who referred him to the ED today.  Past Medical History:  Diagnosis Date  . CHF (congestive heart failure) ([place]) 05/28/2018   dx 05/28/18  . Diabetes mellitus without complication ([place])   . Hyperlipidemia   . Hypertension   . Perforating neurotrophic ulcer of foot [facility])     Patient Active Problem List   Diagnosis Date Noted  . Acute CHF (congestive heart failure) ([place]) 05/28/2018  . Type 2 diabetes mellitus with stage 3 chronic kidney disease ([place]) 05/28/2018  . CKD stage 4 due to type 2 diabetes mellitus ([place]) 05/28/2018  . CHF (congestive heart failure) ([place]) 05/28/2018    Past Surgical History:  Procedure Laterality Date  . ACHILLES TENDON  REPAIR    . CYST EXCISION          Home Medications    Prior to Admission medications   Medication Sig Start Date End Date Taking? Authorizing Provider  amLODipine (NORVASC) 10 MG tablet Take 10 mg by mouth daily.   Yes [provider]  aspirin 81 MG tablet Take 81 mg by mouth daily.   Yes [provider]  gabapentin (NEURONTIN) 300 MG capsule TAKE ONE CAPSULE BY MOUTH THREE TIMES DAILY Patient taking differently: Take 300 mg by mouth 3 (three) times daily.  03/13/16  Yes Weber, Sarah L, PA-C  insulin aspart (NOVOLOG) 100 UNIT/ML injection Inject 4-8 Units into the skin 3 (three) times daily before meals. Per sliding scale   Yes [provider]  insulin glargine (LANTUS) 100 UNIT/ML injection Inject 20 Units into the skin daily.    Yes [provider]  losartan (COZAAR) 100 MG tablet Take 50 mg by mouth daily. 04/26/18  Yes [provider]  cefdinir (OMNICEF) 300 MG capsule Take 1 capsule (300 mg total) by mouth 2 (two) times daily. Patient not taking: Reported on 05/28/2018 05/20/18   Lajean Saver, MD  cyclobenzaprine (FLEXERIL) 10 MG tablet Take 1 tablet (10 mg total) by mouth at bedtime. Patient not taking: Reported on 05/28/2018 07/21/15   Leandrew Koyanagi, MD  HYDROcodone-acetaminophen [facility]) 5-325 MG tablet 1-2 at bedtime Patient not taking: Reported on 05/28/2018 07/21/15  Leandrew Koyanagi, MD  meloxicam (MOBIC) 15 MG tablet Take 1 tablet (15 mg total) by mouth daily. Patient not taking: Reported on 05/28/2018 07/21/15   Leandrew Koyanagi, MD  ondansetron (ZOFRAN-ODT) 4 MG disintegrating tablet Take 1 tablet (4 mg total) by mouth every 8 (eight) hours as needed for nausea or vomiting. Patient not taking: Reported on 05/28/2018 03/07/17   Davonna Belling, MD  traMADol (ULTRAM) 50 MG tablet Take 1 tablet (50 mg total) by mouth every 6 (six) hours as needed for severe pain. Patient not taking: Reported on 05/28/2018 03/07/17   Duffy Bruce, MD    Family History No family history on file.  Social History Social History   Tobacco Use  . Smoking status: Never Smoker  . Smokeless tobacco: Never Used  Substance Use Topics  . Alcohol use: No    Alcohol/week: 0.0 standard drinks  . Drug use: No     Allergies   Patient has no known allergies.   Review of Systems Review of Systems  Constitutional: Positive for activity change and fatigue. Negative for appetite change, chills and fever.  HENT: Negative for ear pain and sore throat.   Eyes: Negative for pain and visual disturbance.  Respiratory: Positive for cough, shortness of breath and wheezing.   Cardiovascular: Negative for chest pain and palpitations.  Gastrointestinal: Positive for abdominal distention (mild) and diarrhea. Negative for abdominal pain and vomiting.  Genitourinary: Negative for dysuria and hematuria.  Musculoskeletal: Negative for arthralgias and back pain.  Skin: Negative for color change and rash.  Neurological: Negative for seizures and syncope.  All other systems reviewed and are negative.    Physical Exam Updated Vital Signs BP (!) 146/80 (BP Location: Left Arm)   Pulse 72   Temp 98 F (36.7 C) (Oral)   Resp 18   Ht 5\' 10"  (1.778 m)   Wt 96.6 kg   SpO2 96%   BMI 30.55 kg/m   Physical Exam Vitals signs and nursing note reviewed.  Constitutional:      Appearance: He is well-developed.  HENT:     Head: Normocephalic and atraumatic.  Eyes:     Conjunctiva/sclera: Conjunctivae normal.  Neck:     Musculoskeletal: Neck supple.  Cardiovascular:     Rate and Rhythm: Normal rate and regular rhythm.     Heart sounds: S1 normal and S2 normal. No murmur.  Pulmonary:     Effort: Tachypnea and accessory muscle usage (on room air) present. No respiratory distress.     Breath sounds: Examination of the right-lower field reveals decreased breath sounds. Examination of the left-lower field reveals decreased breath sounds.  Decreased breath sounds and rhonchi (bilateral, equal.) present. No wheezing or rales.  Abdominal:     Palpations: Abdomen is soft.     Tenderness: There is no abdominal tenderness. There is no guarding or rebound. Negative signs include Murphy's sign, Rovsing's sign and McBurney's sign.  Musculoskeletal: Normal range of motion.     Right lower leg: Edema (1+ pitting to level of right shin) present.     Left lower leg: Edema (trace to 1+ pitting edema to left shin) present.  Skin:    General: Skin is warm and dry.  Neurological:     General: No focal deficit present.     Mental Status: He is alert and oriented to person, place, and time.     GCS: GCS eye subscore is 4. GCS verbal subscore is 5. GCS motor subscore is 6.  Cranial Nerves: Cranial nerves are intact.     Sensory: Sensation is intact.     Motor: Motor function is intact.      ED Treatments / Results  Labs (all labs ordered are listed, but only abnormal results are displayed) Labs Reviewed  BASIC METABOLIC PANEL - Abnormal; Notable for the following components:      Result Value   CO2 21 (*)    Glucose, Bld 120 (*)    Creatinine, Ser 2.29 (*)    Calcium 7.2 (*)    GFR calc non Af Amer 28 (*)    GFR calc Af Amer 33 (*)    All other components within normal limits  CBC - Abnormal; Notable for the following components:   Hemoglobin 10.8 (*)    HCT 36.2 (*)    MCH 25.2 (*)    MCHC 29.8 (*)    RDW 17.1 (*)    All other components within normal limits  BRAIN NATRIURETIC PEPTIDE - Abnormal; Notable for the following components:   B Natriuretic Peptide 534.6 (*)    All other components within normal limits  HEPATIC FUNCTION PANEL - Abnormal; Notable for the following components:   Total Protein 8.4 (*)    All other components within normal limits  HEMOGLOBIN A1C - Abnormal; Notable for the following components:   Hgb A1c MFr Bld 6.2 (*)    All other components within normal limits  GLUCOSE, CAPILLARY - Abnormal;  Notable for the following components:   Glucose-Capillary 119 (*)    All other components within normal limits  MAGNESIUM  TROPONIN I  TSH  GLUCOSE, CAPILLARY  HIV ANTIBODY (ROUTINE TESTING W REFLEX)  CBC  BASIC METABOLIC PANEL  I-STAT TROPONIN, ED    EKG EKG Interpretation  Date/Time:  Friday May 28 2018 14:15:27 EST Ventricular Rate:  74 PR Interval:  142 QRS Duration: 124 QT Interval:  470 QTC Calculation: 521 R Axis:   7 Text Interpretation:  Normal sinus rhythm Right bundle branch block Minimal voltage criteria for LVH, may be normal variant Inferior infarct , age undetermined T wave abnormality, consider lateral ischemia Abnormal ECG Confirmed by Lacretia Leigh ([id number]) on 05/28/2018 3:40:40 PM   Radiology Dg Chest Portable 1 View  Result Date: 05/28/2018 CLINICAL DATA:  Dyspnea.  Assess for congestive heart failure. EXAM: PORTABLE CHEST 1 VIEW COMPARISON:  May 20, 2018 FINDINGS: The heart size and mediastinal contours are stable. The heart size is enlarged. Right pleural effusion is noted. Consolidation of the right mid and lung base are noted. There is pulmonary edema. The visualized skeletal structures are stable. IMPRESSION: Congestive heart failure. Right pleural effusion. Patchy consolidation of right lung base at least in part due to atelectasis but superimposed pneumonia is not excluded. Electronically Signed   By: Abelardo Diesel M.D.   On: 05/28/2018 15:50    Procedures Procedures (including critical care time)  Medications Ordered in ED Medications  aspirin EC tablet 81 mg (has no administration in time range)  insulin glargine (LANTUS) injection 8 Units (has no administration in time range)  gabapentin (NEURONTIN) capsule 300 mg (has no administration in time range)  furosemide (LASIX) injection 20 mg (has no administration in time range)  heparin injection 5,000 Units (5,000 Units Subcutaneous Given 05/28/18 2154)  acetaminophen (TYLENOL) tablet 650 mg  (has no administration in time range)    Or  acetaminophen (TYLENOL) suppository 650 mg (has no administration in time range)  ondansetron (ZOFRAN) tablet 4 mg (has no administration  in time range)    Or  ondansetron (ZOFRAN) injection 4 mg (has no administration in time range)  pneumococcal 23 valent vaccine (PNU-IMMUNE) injection 0.5 mL (has no administration in time range)  furosemide (LASIX) injection 20 mg (20 mg Intravenous Given 05/28/18 1642)     Initial Impression / Assessment and Plan / ED Course  I have reviewed the triage vital signs and the nursing notes.  Pertinent labs & imaging results that were available during my care of the patient were reviewed by me and considered in my medical decision making (see chart for details).      MDM:  Imaging: Chest x-ray with congestive heart failure findings, right pleural effusion, patchy consolidation in the right lung base at least in part due to atelectasis with superimposed pneumonia not excluded.  ED Provider Interpretation of EKG: Sinus rhythm with a rate of 74 bpm, normal axis, no ST segment elevation or depression.  QTC prolonged at 521.  Right bundle branch block present.  Biphasic/inverted T wave in lead I and aVL, V2 through V5.  All of these abnormalities were present on prior EKG on May 21, 2018.  Labs: White count  10.4, hemoglobin 10.8, platelets 256, BMP with a creatinine of 2.29 increased from recent 2.22 on 05/20/2018 (baseline around 1.5-1.7) and CO2 21 unchanged from prior.  Otherwise unremarkable.  Troponin 0.01 and repeat pending.  Magnesium 2.3.  BNP 534.  On initial evaluation, patient appears ill. Afebrile and hemodynamically stable although patient was hypoxic to 85% on room air with tachypnea in the low 20s. Alert and oriented x4, pleasant, and cooperative.  Presents with fatigue and dyspnea as above.  Exam, patient has Monica's work of breathing.  Decreased breath sounds at the bases and rhonchi bilaterally  without rales or focal/unilateral abnormality.  Heart sounds are normal.  He has mild peripheral lower extremity edema and endorses orthopnea as well as worsening dyspnea at rest as well as exertion.  Hypoxic on room air and placed on 2 L nasal cannula with improvement in his tachypnea and his subjective shortness of breath.  Sats in the mid to high 90s thereafter.  Chest x-ray shows**.  EKG without evidence for acute ischemia or arrhythmia although he does have prior abnormalities as detailed above.  Troponin negative initially and story is not consistent with pericarditis.  Low suspicion for myocarditis.  No leukocytosis or subjective or objective fever.  Has minimal cough which is nonproductive.  No other constitutional signs or symptoms and is status post appropriate outpatient treatment for CAP.  Low suspicion for infectious etiology at this time including myocarditis but this does remain on the differential.  No tachycardia and no unilateral lower extremity pain or swelling.  No palpable cords.  No recent mobilization or other risk factors for DVT/PE.  Low suspicion for DVT or PE at this time.  No history of COPD or asthma no wheezing on exam today.  Did receive neb in his doctor's office earlier which did not help.  No indication for additional nebs at this time or steroids.  BNP 500s as above, highest of his prior record.  Patient has no history of CAD or CHF but appears to have new onset congestive heart failure at this time.  Given 20 mg of IV Lasix in the ED given that he is Lasix nave and has acute kidney injury which is stable from labs on 05/20/2018 although improved from his prior baseline.  Patient was admitted to the hospital service in stable  condition thereafter.  The plan for this patient was discussed with Dr. Zenia Resides who voiced agreement and who oversaw evaluation and treatment of this patient.   The patient was fully informed and involved with the history taking, evaluation, workup  including labs/images, and plan. The patient's concerns and questions were addressed to the patient's satisfaction and he expressed agreement with the plan to admit.    Final Clinical Impressions(s) / ED Diagnoses   Final diagnoses:  Acute congestive heart failure, unspecified heart failure type [facility])    ED Discharge Orders    None       Deroy Noah, Rodena Goldmann, MD 05/29/18 Sande Rives    Lacretia Leigh, MD 05/31/18 1354

## 2018-05-28 NOTE — ED Provider Notes (Signed)
I saw and evaluated the patient, reviewed the resident's note and I agree with the findings and plan.  EKG: EKG Interpretation  Date/Time:  Friday May 28 2018 14:15:27 EST Ventricular Rate:  74 PR Interval:  142 QRS Duration: 124 QT Interval:  470 QTC Calculation: 521 R Axis:   7 Text Interpretation:  Normal sinus rhythm Right bundle branch block Minimal voltage criteria for LVH, may be normal variant Inferior infarct , age undetermined T wave abnormality, consider lateral ischemia Abnormal ECG Confirmed by Lacretia Leigh ([id number]) on 05/28/2018 3:54:59 PM   68 year old male presents with increasing dyspnea on exertion as well as some orthopnea.  Recently diagnosed with pneumonia.  Seen by his primary care doctor today and then sent here for evaluation possible CHF.  On exam patient does have rhonchi.  Likely CHF.  Will require admission.    Lacretia Leigh, MD 05/28/18 [phone]

## 2018-05-29 ENCOUNTER — Encounter (HOSPITAL_COMMUNITY): Payer: Self-pay | Admitting: *Deleted

## 2018-05-29 ENCOUNTER — Inpatient Hospital Stay (HOSPITAL_COMMUNITY): Payer: Medicare Other

## 2018-05-29 DIAGNOSIS — I10 Essential (primary) hypertension: Secondary | ICD-10-CM

## 2018-05-29 DIAGNOSIS — I361 Nonrheumatic tricuspid (valve) insufficiency: Secondary | ICD-10-CM

## 2018-05-29 DIAGNOSIS — I370 Nonrheumatic pulmonary valve stenosis: Secondary | ICD-10-CM

## 2018-05-29 DIAGNOSIS — N179 Acute kidney failure, unspecified: Secondary | ICD-10-CM

## 2018-05-29 DIAGNOSIS — R9431 Abnormal electrocardiogram [ECG] [EKG]: Secondary | ICD-10-CM

## 2018-05-29 DIAGNOSIS — I5031 Acute diastolic (congestive) heart failure: Secondary | ICD-10-CM

## 2018-05-29 DIAGNOSIS — N183 Chronic kidney disease, stage 3 (moderate): Secondary | ICD-10-CM

## 2018-05-29 LAB — CBC
HCT: 32.1 % — ABNORMAL LOW (ref 39.0–52.0)
Hemoglobin: 10 g/dL — ABNORMAL LOW (ref 13.0–17.0)
MCH: 26.2 pg (ref 26.0–34.0)
MCHC: 31.2 g/dL (ref 30.0–36.0)
MCV: 84 fL (ref 80.0–100.0)
Platelets: 238 10*3/uL (ref 150–400)
RBC: 3.82 MIL/uL — ABNORMAL LOW (ref 4.22–5.81)
RDW: 16.9 % — ABNORMAL HIGH (ref 11.5–15.5)
WBC: 9 10*3/uL (ref 4.0–10.5)
nRBC: 0.2 % (ref 0.0–0.2)

## 2018-05-29 LAB — GLUCOSE, CAPILLARY
Glucose-Capillary: 105 mg/dL — ABNORMAL HIGH (ref 70–99)
Glucose-Capillary: 118 mg/dL — ABNORMAL HIGH (ref 70–99)
Glucose-Capillary: 112 mg/dL — ABNORMAL HIGH (ref 70–99)
Glucose-Capillary: 79 mg/dL (ref 70–99)

## 2018-05-29 LAB — BASIC METABOLIC PANEL
Anion gap: 10 (ref 5–15)
BUN: 25 mg/dL — ABNORMAL HIGH (ref 8–23)
CO2: 22 mmol/L (ref 22–32)
Calcium: 6.7 mg/dL — ABNORMAL LOW (ref 8.9–10.3)
Chloride: 110 mmol/L (ref 98–111)
Creatinine, Ser: 2.46 mg/dL — ABNORMAL HIGH (ref 0.61–1.24)
GFR calc Af Amer: 30 mL/min — ABNORMAL LOW (ref >60)
GFR calc non Af Amer: 26 mL/min — ABNORMAL LOW (ref >60)
Glucose, Bld: 130 mg/dL — ABNORMAL HIGH (ref 70–99)
Potassium: 4.1 mmol/L (ref 3.5–5.1)
Sodium: 142 mmol/L (ref 135–145)

## 2018-05-29 LAB — ECHOCARDIOGRAM COMPLETE
Height: 70 in
Weight: 3395.2 oz

## 2018-05-29 LAB — HIV ANTIBODY (ROUTINE TESTING W REFLEX): HIV Screen 4th Generation wRfx: NONREACTIVE

## 2018-05-29 MED ORDER — FUROSEMIDE 10 MG/ML IJ SOLN
80.0000 mg | Freq: Two times a day (BID) | INTRAMUSCULAR | Status: DC
  Administered 2018-05-29 – 2018-06-01 (×6): 80 mg via INTRAVENOUS
  Filled 2018-05-29 (×6): qty 8

## 2018-05-29 MED ORDER — AMLODIPINE BESYLATE 10 MG PO TABS
10.0000 mg | ORAL_TABLET | Freq: Every day | ORAL | Status: DC
  Administered 2018-05-29 – 2018-06-04 (×7): 10 mg via ORAL
  Filled 2018-05-29 (×7): qty 1

## 2018-05-29 MED ORDER — INSULIN GLARGINE 100 UNIT/ML ~~LOC~~ SOLN
5.0000 [IU] | Freq: Every day | SUBCUTANEOUS | Status: DC
  Administered 2018-05-29 – 2018-06-04 (×7): 5 [IU] via SUBCUTANEOUS
  Filled 2018-05-29 (×7): qty 0.05

## 2018-05-29 MED ORDER — PERFLUTREN LIPID MICROSPHERE
1.0000 mL | INTRAVENOUS | Status: AC | PRN
  Administered 2018-05-29: 2 mL via INTRAVENOUS
  Filled 2018-05-29: qty 10

## 2018-05-29 MED ORDER — FUROSEMIDE 10 MG/ML IJ SOLN: 40.0000 mg | Freq: Two times a day (BID) | INTRAMUSCULAR | Status: DC

## 2018-05-29 MED ORDER — FUROSEMIDE 10 MG/ML IJ SOLN
20.0000 mg | Freq: Once | INTRAMUSCULAR | Status: AC
  Administered 2018-05-29: 20 mg via INTRAVENOUS
  Filled 2018-05-29: qty 2

## 2018-05-29 NOTE — Progress Notes (Signed)
  Echocardiogram 2D Echocardiogram has been performed.  Melvin Mcclure 05/29/2018, 10:27 AM

## 2018-05-29 NOTE — Progress Notes (Signed)
Verified pt history with pt and pt wife at bedside   Pt states he is currently being worked up outpatient for vertigo

## 2018-05-29 NOTE — Consult Note (Addendum)
Cardiology Consultation:   Patient ID: Melvin Mcclure MRN: [id number]; DOB: 1950/09/06  Admit date: 05/28/2018 Date of Consult: 05/29/2018  Primary Care Provider: Nolene Ebbs, MD Primary Cardiologist: New - Dr. Stanford Breed Primary Electrophysiologist:  None    Patient Profile:   Melvin Mcclure is a 68 y.o. male with a hx of uncontrolled HTN, DM II on insulin and CKD stage III-IV who is being seen today for the evaluation of acute on chronic diastolic heart failure at the request of Dr. Broadus John.  History of Present Illness:   Melvin Mcclure is a 68 yo male with PMH of uncontrolled HTN, DM II on insulin, and CKD stage III-IV.  According to the patient, he was evaluated 25 years ago by Dr. Montez Morita of cardiology service for abnormal EKG.  He underwent stress test but did not undergo any further intervention after that.  He says he was completely asymptomatic and playing tennis at the time.  He grew up in [place] [facility] [place] and spent some years in [place] where he met his wife.  They have been married for more than 40 years.  According to the patient, he had longstanding history of diabetes and hypertension for the past 20 years.  He used to be on hydrochlorothiazide, however given worsening renal function, hydrochlorothiazide was recently discontinued.  He is being followed by Dr. Royce Macadamia of nephrology group (although I cannot locate a Dr. Royce Macadamia under [facility] website).  According to the patient, his primary care provider was very concerned of heart failure over a week ago.  He recommended cardiology consultation and echocardiogram.  He was sent to the emergency room, however chest x-ray was concerning for right lower lobe pneumonia.  Therefore he was eventually discharged on 2 antibiotic.  He says the symptoms did not improve afterward, he developed orthopnea and PND for the past week.  The total duration of his shortness of breath has been for the past 3 weeks total.  Prior to  that, he was not very active however he did not notice any significant dyspnea.    Due to worsening symptom, and he eventually came back to the hospital on 05/28/2018.  EKG showed normal sinus rhythm with T wave inversion in the lateral leads was new deep Q waves in the inferior lead which is new compared to 2018.  Patient says he had only one episode of chest pain recently and that this only lasted a few seconds.  Chest x-ray noted developing heart failure.  He underwent treatment with IV diuresis with improvement in the symptoms.  Creatinine trended up from 2.2 up to 2.46.  Hemoglobin A1c was 6.2.  TSH was normal.  Troponin was negative.  BNP was elevated at 534.6.  Patient was admitted by hospitalist service for management of acute on chronic diastolic heart failure.  Cardiology was consulted for the same.  Echocardiogram obtained on 05/29/2018 showed EF 60 to 65%, severe LVH.   Past Medical History:  Diagnosis Date  . CHF (congestive heart failure) ([place]) 05/28/2018   dx 05/28/18  . Diabetes mellitus without complication ([place])   . Hyperlipidemia   . Hypertension   . Perforating neurotrophic ulcer of foot ([place])     Past Surgical History:  Procedure Laterality Date  . ACHILLES TENDON REPAIR    . CYST EXCISION       Home Medications:  Prior to Admission medications   Medication Sig Start Date End Date Taking? Authorizing Provider  amLODipine (NORVASC) 10 MG tablet Take 10 mg  by mouth daily.   Yes [provider]  aspirin 81 MG tablet Take 81 mg by mouth daily.   Yes [provider]  gabapentin (NEURONTIN) 300 MG capsule TAKE ONE CAPSULE BY MOUTH THREE TIMES DAILY Patient taking differently: Take 300 mg by mouth 3 (three) times daily.  03/13/16  Yes Weber, Sarah L, PA-C  insulin aspart (NOVOLOG) 100 UNIT/ML injection Inject 4-8 Units into the skin 3 (three) times daily before meals. Per sliding scale   Yes [provider]  insulin glargine (LANTUS) 100 UNIT/ML  injection Inject 20 Units into the skin daily.    Yes [provider]  losartan (COZAAR) 100 MG tablet Take 50 mg by mouth daily. 04/26/18  Yes [provider]  cefdinir (OMNICEF) 300 MG capsule Take 1 capsule (300 mg total) by mouth 2 (two) times daily. Patient not taking: Reported on 05/28/2018 05/20/18   Lajean Saver, MD  cyclobenzaprine (FLEXERIL) 10 MG tablet Take 1 tablet (10 mg total) by mouth at bedtime. Patient not taking: Reported on 05/28/2018 07/21/15   Leandrew Koyanagi, MD  HYDROcodone-acetaminophen [facility]) 5-325 MG tablet 1-2 at bedtime Patient not taking: Reported on 05/28/2018 07/21/15   Leandrew Koyanagi, MD  meloxicam (MOBIC) 15 MG tablet Take 1 tablet (15 mg total) by mouth daily. Patient not taking: Reported on 05/28/2018 07/21/15   Leandrew Koyanagi, MD  ondansetron (ZOFRAN-ODT) 4 MG disintegrating tablet Take 1 tablet (4 mg total) by mouth every 8 (eight) hours as needed for nausea or vomiting. Patient not taking: Reported on 05/28/2018 03/07/17   Davonna Belling, MD  traMADol (ULTRAM) 50 MG tablet Take 1 tablet (50 mg total) by mouth every 6 (six) hours as needed for severe pain. Patient not taking: Reported on 05/28/2018 03/07/17   Duffy Bruce, MD    Inpatient Medications: Scheduled Meds: . aspirin EC  81 mg Oral Daily  . furosemide  40 mg Intravenous Q12H  . gabapentin  300 mg Oral QHS  . heparin  5,000 Units Subcutaneous Q8H  . insulin glargine  5 Units Subcutaneous Daily  . pneumococcal 23 valent vaccine  0.5 mL Intramuscular Tomorrow-1000   Continuous Infusions:  PRN Meds: acetaminophen **OR** acetaminophen, ondansetron **OR** ondansetron (ZOFRAN) IV  Allergies:   No Known Allergies  Social History:   Social History   Socioeconomic History  . Marital status: Married    Spouse name: Not on file  . Number of children: Not on file  . Years of education: Not on file  . Highest education level: Not on file  Occupational History  .  Not on file  Social Needs  . Financial resource strain: Not on file  . Food insecurity:    Worry: Not on file    Inability: Not on file  . Transportation needs:    Medical: Not on file    Non-medical: Not on file  Tobacco Use  . Smoking status: Never Smoker  . Smokeless tobacco: Never Used  Substance and Sexual Activity  . Alcohol use: No    Alcohol/week: 0.0 standard drinks  . Drug use: No  . Sexual activity: Not on file  Lifestyle  . Physical activity:    Days per week: Not on file    Minutes per session: Not on file  . Stress: Not on file  Relationships  . Social connections:    Talks on phone: Not on file    Gets together: Not on file    Attends religious service: Not on file  Active member of club or organization: Not on file    Attends meetings of clubs or organizations: Not on file    Relationship status: Not on file  . Intimate partner violence:    Fear of current or ex partner: Not on file    Emotionally abused: Not on file    Physically abused: Not on file    Forced sexual activity: Not on file  Other Topics Concern  . Not on file  Social History Narrative  . Not on file    Family History:   Family History  Problem Relation Age of Onset  . Diabetes Mellitus II Mother   . Hypertension Father      ROS:  Please see the history of present illness.   All other ROS reviewed and negative.     Physical Exam/Data:   Vitals:   05/28/18 1802 05/28/18 2000 05/29/18 0444 05/29/18 0925  BP: (!) 152/77 (!) 146/80 (!) 152/84 (!) 142/87  Pulse: 70 72 72 69  Resp: _0 Temp: 98.3 F (36.8 C) 98 F (36.7 C) 98.7 F (37.1 C)   TempSrc: Oral Oral Oral   SpO2: 98% 96% 92% 97%  Weight:   96.3 kg   Height:        Intake/Output Summary (Last 24 hours) at 05/29/2018 1230 Last data filed at 05/29/2018 0630 Gross per 24 hour  Intake 240 ml  Output 400 ml  Net -160 ml   Last 3 Weights 05/29/2018 05/28/2018 03/07/2017  Weight (lbs) 212 lb 3.2 oz 212 lb  14.4 oz 205 lb  Weight (kg) 96.253 kg 96.571 kg 92.987 kg     Body mass index is 30.45 kg/m.  General:  Well nourished, well developed, in no acute distress HEENT: normal Lymph: no adenopathy Neck: no JVD Endocrine:  No thryomegaly Vascular: No carotid bruits; FA pulses 2+ bilaterally without bruits  Cardiac:  normal S1, S2; RRR; no murmur  Lungs: Decreased breath sounds in bilateral bases Abd: soft, nontender, no hepatomegaly  Ext: no edema Musculoskeletal:  No deformities, BUE and BLE strength normal and equal Skin: warm and dry  Neuro:  CNs 2-12 intact, no focal abnormalities noted Psych:  Normal affect   EKG:  The EKG was personally reviewed and demonstrates: Normal sinus rhythm with right bundle branch block, T wave inversion in the lateral leads and new Q waves in the inferior leads  Telemetry:  Telemetry was personally reviewed and demonstrates: Normal sinus rhythm  Relevant CV Studies:  Echo 05/29/2018 LV EF: 60% -   65% Study Conclusions  - Procedure narrative: Transthoracic echocardiography. Image   quality was suboptimal. The study was technically difficult, as a   result of poor sound wave transmission. Intravenous contrast   (Definity) was administered. - Left ventricle: The cavity size was normal. Wall thickness was   increased in a pattern of severe LVH. Systolic function was   normal. The estimated ejection fraction was in the range of 60%   to 65%. Wall motion was normal; there were no regional wall   motion abnormalities. Left ventricular diastolic function   parameters were normal. - Atrial septum: No defect or patent foramen ovale was identified.  Laboratory Data:  Chemistry Recent Labs  Lab 05/28/18 1411 05/29/18 0439  NA 141 142  K 4.1 4.1  CL 110 110  CO2 21* 22  GLUCOSE 120* 130*  BUN 23 25*  CREATININE 2.29* 2.46*  CALCIUM 7.2* 6.7*  GFRNONAA 28* 26*  GFRAA  33* 30*  ANIONGAP 10 10    Recent Labs  Lab 05/28/18 1531  PROT 8.4*    ALBUMIN 3.5  AST 23  ALT 16  ALKPHOS 86  BILITOT 1.0   Hematology Recent Labs  Lab 05/28/18 1411 05/29/18 0439  WBC 10.4 9.0  RBC 4.29 3.82*  HGB 10.8* 10.0*  HCT 36.2* 32.1*  MCV 84.4 84.0  MCH 25.2* 26.2  MCHC 29.8* 31.2  RDW 17.1* 16.9*  PLT 256 238   Cardiac Enzymes Recent Labs  Lab 05/28/18 1832  TROPONINI <0.03    Recent Labs  Lab 05/28/18 1425  TROPIPOC 0.01    BNP Recent Labs  Lab 05/28/18 1525  BNP 534.6*    DDimer No results for input(s): DDIMER in the last 168 hours.  Radiology/Studies:  Dg Chest Portable 1 View  Result Date: 05/28/2018 CLINICAL DATA:  Dyspnea.  Assess for congestive heart failure. EXAM: PORTABLE CHEST 1 VIEW COMPARISON:  May 20, 2018 FINDINGS: The heart size and mediastinal contours are stable. The heart size is enlarged. Right pleural effusion is noted. Consolidation of the right mid and lung base are noted. There is pulmonary edema. The visualized skeletal structures are stable. IMPRESSION: Congestive heart failure. Right pleural effusion. Patchy consolidation of right lung base at least in part due to atelectasis but superimposed pneumonia is not excluded. Electronically Signed   By: Abelardo Diesel M.D.   On: 05/28/2018 15:50    Assessment and Plan:   1. Acute on chronic diastolic heart failure: Chest x-ray consistent with heart failure. Patient was recently taken off of hydrochlorothiazide.     - Echocardiogram showed severe LVH likely due to years of uncontrolled high blood pressure.    - We will continue on IV diuresis, he has now put out significant amount of urine, I would recommend increase IV Lasix to 80 mg twice daily.    - Creatinine trended up slightly from 2.2-2.4.  Will need to monitor renal function closely.  2. Abnormal EKG: Single episode of chest pain recently but only lasted a few seconds.  Otherwise no obvious anginal symptom.  EKG showed Q waves in the inferior lead, persistent right bundle branch block which  is chronic for him, also T wave inversion in the lateral leads.  Dr. Stanford Breed recommended outpatient stress test after discharge  3. Uncontrolled hypertension: His blood pressure has been elevated in the 160s at home.  The LVH that was seen on the echocardiogram is likely related to chronic uncontrolled hypertension.  - no BP meds ordered for hospital. Restart home amlodipine. Patient's wife mentioned bystolic, I do not see it under his medication list, will need pharmacy to reconcile. He is on losartan 27m daily, may restart near the end of diuresis of renal function stable. Start hydralazine.   4. DM 2  5. Stage III-IV CKD: Followed by nephrology as outpatient.      For questions or updates, please contact CGene AutryPlease consult [email or web address] for contact info under     Signed, HAlmyra Deforest PFair [place] 05/29/2018 12:30 PM As above, patient seen and examined.  Briefly he is a 68year old male with past medical history of diabetes mellitus, hypertension, chronic stage III kidney disease for evaluation of acute diastolic congestive heart failure.  Patient states that for the past 3 weeks he has noticed progressive dyspnea on exertion.  He has been treated for pneumonia but his symptoms worsened and progressed to include orthopnea and PND.  He denies pedal edema, palpitations or syncope.  One episode of chest discomfort lasting seconds.  No exertional chest pain.  Admitted and cardiology asked to evaluate.  Chest x-ray shows congestive heart failure.  Creatinine 2.46.  Enzymes negative.  BNP 534.  Hemoglobin 10.  Echocardiogram shows normal LV function and severe left ventricular hypertrophy.  Electrocardiogram shows sinus rhythm, left ventricular hypertrophy, right bundle branch block, lateral T wave inversion and prior inferior infarct cannot be excluded.  1 acute diastolic congestive heart failure-patient presents with probable CHF.  I agree with Lasix but will increase to 80 mg IV twice daily.   Follow renal function closely.  Needs fluid restriction to 1 L daily and low-sodium diet.  Echocardiogram shows preserved LV function and severe left ventricular hypertrophy.  2 acute on chronic stage III kidney disease-follow renal function closely with diuresis.  He is followed by nephrology as an outpatient.  3 hypertension-would resume amlodipine 10 mg daily.  Hold ARB given worsening renal function.  Follow blood pressure and adjust regimen as needed.  4 abnormal electrocardiogram-possible inferior infarct noted.  Patient also with multiple risk factors including 25 years of diabetes mellitus.  Will need nuclear study as an outpatient.  No wall motion is normal on echocardiogram.  He would be at risk for contrast nephropathy if catheterization required.  5 diabetes mellitus-patient would benefit from a statin long-term.  Kirk Ruths, MD

## 2018-05-29 NOTE — Progress Notes (Signed)
PROGRESS NOTE    Melvin Mcclure  AYT:[id number] DOB: [birth year] DOA: 05/28/2018 PCP: Nolene Ebbs, MD  Brief Narrative: Melvin Mcclure is a 68 y.o. male with medical history significant of type 2 diabetes mellitus on insulin, stage III-IV chronic kidney disease, neuropathy was in his usual state of health until approximately 1 week ago, patient was seen in the emergency room for shortness of breath and diagnosed with pneumonia given question infiltrate noted on x-ray then discharged home on antibiotics but reported no improvement, progressively had worsening shortness of breath, orthopnea, dizziness and weakness, repeat x-ray yesterday in the ED was more suggestive of fluid overload/CHF BNP- 500 range, creatinine is 2.2  Assessment & Plan:   New onset CHF, diastolic -Complicated by stage IV kidney disease -Echocardiogram this morning with preserved EF, severe LVH, normal wall motion  -Continue IV Lasix, increased dose to 40 mg every 12 -Monitor urine output/weights -We will request cardiology input -TSH within normal limits  Type 2 diabetes mellitus -On insulin, CBGs have been running range, likely secondary to CKD -Restarted Lantus at a lower dose, follow-up hemoglobin A1c, sensitive sliding scale   Chronic kidney disease stage IV -Patient reports baseline creatinine in the 2.0 range,  now 2.4  -Was taking PRN NSAIDs at home and also expect tolerant of cardiorenal syndrome -continue diuresis, monitor, may improve  DVT prophylaxis: Subcutaneous heparin Code Status: Full code Family Communication:  Wife at bedside Disposition Plan:  Home pending improvement in volume status  Consultants:   Cardiology   Procedures:   Antimicrobials:    Subjective: -Feels tired, short of breath, orthopnea, leg swelling is better today  Objective: Vitals:   05/28/18 1802 05/28/18 2000 05/29/18 0444 05/29/18 0925  BP: (!) 152/77 (!) 146/80 (!) 152/84 (!) 142/87  Pulse: 70 72 72 69    Resp: 20 18 18 19   Temp: 98.3 F (36.8 C) 98 F (36.7 C) 98.7 F (37.1 C)   TempSrc: Oral Oral Oral   SpO2: 98% 96% 92% 97%  Weight:   96.3 kg   Height:        Intake/Output Summary (Last 24 hours) at 05/29/2018 1126 Last data filed at 05/29/2018 0630 Gross per 24 hour  Intake 240 ml  Output 400 ml  Net -160 ml   Filed Weights   05/28/18 1800 05/29/18 0444  Weight: 96.6 kg 96.3 kg    Examination:  General exam: Appears calm and comfortable  Respiratory system: Fine basilar crackles Cardiovascular system: S1 & S2 heard, RRR. Gastrointestinal system: Abdomen is nondistended, soft and nontender.Normal bowel sounds heard. Central nervous system: Alert and oriented. No focal neurological deficits. Extremities: Trace edema Skin: No rashes, lesions or ulcers Psychiatry: Judgement and insight appear normal. Mood & affect appropriate.     Data Reviewed:   CBC: Recent Labs  Lab 05/28/18 1411 05/29/18 0439  WBC 10.4 9.0  HGB 10.8* 10.0*  HCT 36.2* 32.1*  MCV 84.4 84.0  PLT 256 355   Basic Metabolic Panel: Recent Labs  Lab 05/28/18 1411 05/28/18 1525 05/29/18 0439  NA 141  --  142  K 4.1  --  4.1  CL 110  --  110  CO2 21*  --  22  GLUCOSE 120*  --  130*  BUN 23  --  25*  CREATININE 2.29*  --  2.46*  CALCIUM 7.2*  --  6.7*  MG  --  2.3  --    GFR: Estimated Creatinine Clearance: 33.5 mL/min (A) (by C-G formula based on  SCr of 2.46 mg/dL (H)). Liver Function Tests: Recent Labs  Lab 05/28/18 1531  AST 23  ALT 16  ALKPHOS 86  BILITOT 1.0  PROT 8.4*  ALBUMIN 3.5   No results for input(s): LIPASE, AMYLASE in the last 168 hours. No results for input(s): AMMONIA in the last 168 hours. Coagulation Profile: No results for input(s): INR, PROTIME in the last 168 hours. Cardiac Enzymes: Recent Labs  Lab 05/28/18 1832  TROPONINI <0.03   BNP (last 3 results) No results for input(s): PROBNP in the last 8760 hours. HbA1C: Recent Labs    05/28/18 1832   HGBA1C 6.2*   CBG: Recent Labs  Lab 05/28/18 1805 05/28/18 2050 05/29/18 0744 05/29/18 1121  GLUCAP 90 119* 105* 118*   Lipid Profile: No results for input(s): CHOL, HDL, LDLCALC, TRIG, CHOLHDL, LDLDIRECT in the last 72 hours. Thyroid Function Tests: Recent Labs    05/28/18 1832  TSH 2.399   Anemia Panel: No results for input(s): VITAMINB12, FOLATE, FERRITIN, TIBC, IRON, RETICCTPCT in the last 72 hours. Urine analysis:    Component Value Date/Time   COLORURINE YELLOW 03/07/2017 1413   APPEARANCEUR CLEAR 03/07/2017 1413   LABSPEC 1.015 03/07/2017 1413   PHURINE 5.0 03/07/2017 1413   GLUCOSEU >=500 (A) 03/07/2017 1413   HGBUR MODERATE (A) 03/07/2017 1413   BILIRUBINUR NEGATIVE 03/07/2017 1413   KETONESUR 5 (A) 03/07/2017 1413   PROTEINUR 100 (A) 03/07/2017 1413   UROBILINOGEN 0.2 06/17/2010 1620   NITRITE NEGATIVE 03/07/2017 1413   LEUKOCYTESUR NEGATIVE 03/07/2017 1413   Sepsis Labs: @LABRCNTIP (procalcitonin:4,lacticidven:4)  )No results found for this or any previous visit (from the past 240 hour(s)).       Radiology Studies: Dg Chest Portable 1 View  Result Date: 05/28/2018 CLINICAL DATA:  Dyspnea.  Assess for congestive heart failure. EXAM: PORTABLE CHEST 1 VIEW COMPARISON:  May 20, 2018 FINDINGS: The heart size and mediastinal contours are stable. The heart size is enlarged. Right pleural effusion is noted. Consolidation of the right mid and lung base are noted. There is pulmonary edema. The visualized skeletal structures are stable. IMPRESSION: Congestive heart failure. Right pleural effusion. Patchy consolidation of right lung base at least in part due to atelectasis but superimposed pneumonia is not excluded. Electronically Signed   By: Abelardo Diesel M.D.   On: 05/28/2018 15:50        Scheduled Meds: . aspirin EC  81 mg Oral Daily  . furosemide  40 mg Intravenous Q12H  . gabapentin  300 mg Oral QHS  . heparin  5,000 Units Subcutaneous Q8H  .  insulin glargine  5 Units Subcutaneous Daily  . pneumococcal 23 valent vaccine  0.5 mL Intramuscular Tomorrow-1000   Continuous Infusions:   LOS: 1 day    Time spent: 80min    Domenic Polite, MD [facility] Page via [email or web address], password [email or web address] After 7PM please contact night-coverage  05/29/2018, 11:26 AM

## 2018-05-29 NOTE — Plan of Care (Signed)
  Problem: Clinical Measurements: Goal: Ability to maintain clinical measurements within normal limits will improve Outcome: Progressing   Problem: Clinical Measurements: Goal: Diagnostic test results will improve Outcome: Progressing   Problem: Clinical Measurements: Goal: Respiratory complications will improve Outcome: Progressing   

## 2018-05-29 NOTE — Progress Notes (Signed)
Educated pt on importance of using urinal to collect correct intake and output, especially with increase in lasix  Pt verbalizes understanding

## 2018-05-30 LAB — BASIC METABOLIC PANEL
Anion gap: 9 (ref 5–15)
BUN: 24 mg/dL — ABNORMAL HIGH (ref 8–23)
CO2: 24 mmol/L (ref 22–32)
Calcium: 6.8 mg/dL — ABNORMAL LOW (ref 8.9–10.3)
Chloride: 109 mmol/L (ref 98–111)
Creatinine, Ser: 2.56 mg/dL — ABNORMAL HIGH (ref 0.61–1.24)
GFR calc Af Amer: 29 mL/min — ABNORMAL LOW (ref >60)
GFR calc non Af Amer: 25 mL/min — ABNORMAL LOW (ref >60)
Glucose, Bld: 136 mg/dL — ABNORMAL HIGH (ref 70–99)
Potassium: 4 mmol/L (ref 3.5–5.1)
Sodium: 142 mmol/L (ref 135–145)

## 2018-05-30 LAB — CBC
HCT: 34 % — ABNORMAL LOW (ref 39.0–52.0)
Hemoglobin: 10.2 g/dL — ABNORMAL LOW (ref 13.0–17.0)
MCH: 25.1 pg — ABNORMAL LOW (ref 26.0–34.0)
MCHC: 30 g/dL (ref 30.0–36.0)
MCV: 83.7 fL (ref 80.0–100.0)
Platelets: 240 10*3/uL (ref 150–400)
RBC: 4.06 MIL/uL — ABNORMAL LOW (ref 4.22–5.81)
RDW: 16.6 % — ABNORMAL HIGH (ref 11.5–15.5)
WBC: 8.6 10*3/uL (ref 4.0–10.5)
nRBC: 0 % (ref 0.0–0.2)

## 2018-05-30 LAB — GLUCOSE, CAPILLARY
Glucose-Capillary: 151 mg/dL — ABNORMAL HIGH (ref 70–99)
Glucose-Capillary: 162 mg/dL — ABNORMAL HIGH (ref 70–99)
Glucose-Capillary: 83 mg/dL (ref 70–99)
Glucose-Capillary: 113 mg/dL — ABNORMAL HIGH (ref 70–99)

## 2018-05-30 NOTE — Progress Notes (Signed)
PROGRESS NOTE    Juquan Reznick  ZRA:[id number] DOB: 05-06-1950 DOA: 05/28/2018 PCP: Nolene Ebbs, MD  Brief Narrative: Melvin Mcclure is a 68 y.o. male with medical history significant of type 2 diabetes mellitus on insulin, stage III-IV chronic kidney disease, neuropathy was in his usual state of health until approximately 1 week ago, patient was seen in the emergency room for shortness of breath and diagnosed with pneumonia given question infiltrate noted on x-ray then discharged home on antibiotics but reported no improvement, progressively had worsening shortness of breath, orthopnea, dizziness and weakness, repeat x-ray yesterday in the ED was more suggestive of fluid overload/CHF BNP- 500 range, creatinine is 2.2  Assessment & Plan:   New onset acute diastolic CHF -Complicated by stage IV kidney disease -2D echocardiogram with preserved EF, severe LVH and normal wall motion  -Finally improving with diuresis, continue IV Lasix 80 mg every 12  -He is -2.6 L yesterday -Appreciate cardiology input -Monitor weights, I/os closely, TSH is within normal limits  Abnormal EKG, -Q waves in inferior leads, RBBB -Appreciate cards input, stress test recommended after discharge  Type 2 diabetes mellitus -On insulin, CBGs have been running range, likely secondary to CKD -Continue low-dose Lantus  Chronic kidney disease stage IV -Patient reports baseline creatinine in the 2.0 range,  now 2.5 -Was taking PRN NSAIDs at home and also expect a component of cardiorenal syndrome -continue diuresis, monitor  DVT prophylaxis: Subcutaneous heparin Code Status: Full code Family Communication:  Wife at bedside Disposition Plan:  Home pending improvement in volume status  Consultants:   Cardiology   Procedures:   Antimicrobials:    Subjective: -Feels a little better, breathing is finally improving -Urinated most of the day yesterday  Objective: Vitals:   05/29/18 1957 05/30/18 0012  05/30/18 0404 05/30/18 0722  BP: (!) 170/89 (!) 147/81 (!) 155/82 (!) 152/84  Pulse: 74 73 73 69  Resp: 20 18 20 20   Temp: 98.5 F (36.9 C) 98.2 F (36.8 C) (!) 97.3 F (36.3 C)   TempSrc: Oral Oral Oral   SpO2: 95% 96% 92% 97%  Weight:   94.8 kg   Height:        Intake/Output Summary (Last 24 hours) at 05/30/2018 1126 Last data filed at 05/30/2018 0830 Gross per 24 hour  Intake 840 ml  Output 2800 ml  Net -1960 ml   Filed Weights   05/28/18 1800 05/29/18 0444 05/30/18 0404  Weight: 96.6 kg 96.3 kg 94.8 kg    Examination: Gen: Awake, Alert, Oriented X 3, no distress HEENT: PERRLA, Neck supple, no JVD Lungs: Bilateral fine bibasilar crackles CVS: RRR,No Gallops,Rubs or new Murmurs Abd: soft, Non tender, non distended, BS present Extremities: Trace edema Skin: no new rashes Psychiatry: Judgement and insight appear normal. Mood & affect appropriate.     Data Reviewed:   CBC: Recent Labs  Lab 05/28/18 1411 05/29/18 0439 05/30/18 0533  WBC 10.4 9.0 8.6  HGB 10.8* 10.0* 10.2*  HCT 36.2* 32.1* 34.0*  MCV 84.4 84.0 83.7  PLT 256 238 545   Basic Metabolic Panel: Recent Labs  Lab 05/28/18 1411 05/28/18 1525 05/29/18 0439 05/30/18 0533  NA 141  --  142 142  K 4.1  --  4.1 4.0  CL 110  --  110 109  CO2 21*  --  22 24  GLUCOSE 120*  --  130* 136*  BUN 23  --  25* 24*  CREATININE 2.29*  --  2.46* 2.56*  CALCIUM 7.2*  --  6.7* 6.8*  MG  --  2.3  --   --    GFR: Estimated Creatinine Clearance: 31.9 mL/min (A) (by C-G formula based on SCr of 2.56 mg/dL (H)). Liver Function Tests: Recent Labs  Lab 05/28/18 1531  AST 23  ALT 16  ALKPHOS 86  BILITOT 1.0  PROT 8.4*  ALBUMIN 3.5   No results for input(s): LIPASE, AMYLASE in the last 168 hours. No results for input(s): AMMONIA in the last 168 hours. Coagulation Profile: No results for input(s): INR, PROTIME in the last 168 hours. Cardiac Enzymes: Recent Labs  Lab 05/28/18 1832  TROPONINI <0.03   BNP  (last 3 results) No results for input(s): PROBNP in the last 8760 hours. HbA1C: Recent Labs    05/28/18 1832  HGBA1C 6.2*   CBG: Recent Labs  Lab 05/29/18 0744 05/29/18 1121 05/29/18 1611 05/29/18 2120 05/30/18 0723  GLUCAP 105* 118* 112* 79 151*   Lipid Profile: No results for input(s): CHOL, HDL, LDLCALC, TRIG, CHOLHDL, LDLDIRECT in the last 72 hours. Thyroid Function Tests: Recent Labs    05/28/18 1832  TSH 2.399   Anemia Panel: No results for input(s): VITAMINB12, FOLATE, FERRITIN, TIBC, IRON, RETICCTPCT in the last 72 hours. Urine analysis:    Component Value Date/Time   COLORURINE YELLOW 03/07/2017 1413   APPEARANCEUR CLEAR 03/07/2017 1413   LABSPEC 1.015 03/07/2017 1413   PHURINE 5.0 03/07/2017 1413   GLUCOSEU >=500 (A) 03/07/2017 1413   HGBUR MODERATE (A) 03/07/2017 1413   BILIRUBINUR NEGATIVE 03/07/2017 1413   KETONESUR 5 (A) 03/07/2017 1413   PROTEINUR 100 (A) 03/07/2017 1413   UROBILINOGEN 0.2 06/17/2010 1620   NITRITE NEGATIVE 03/07/2017 1413   LEUKOCYTESUR NEGATIVE 03/07/2017 1413   Sepsis Labs: @LABRCNTIP (procalcitonin:4,lacticidven:4)  )No results found for this or any previous visit (from the past 240 hour(s)).       Radiology Studies: Dg Chest Portable 1 View  Result Date: 05/28/2018 CLINICAL DATA:  Dyspnea.  Assess for congestive heart failure. EXAM: PORTABLE CHEST 1 VIEW COMPARISON:  May 20, 2018 FINDINGS: The heart size and mediastinal contours are stable. The heart size is enlarged. Right pleural effusion is noted. Consolidation of the right mid and lung base are noted. There is pulmonary edema. The visualized skeletal structures are stable. IMPRESSION: Congestive heart failure. Right pleural effusion. Patchy consolidation of right lung base at least in part due to atelectasis but superimposed pneumonia is not excluded. Electronically Signed   By: Abelardo Diesel M.D.   On: 05/28/2018 15:50        Scheduled Meds: . amLODipine  10  mg Oral Daily  . aspirin EC  81 mg Oral Daily  . furosemide  80 mg Intravenous Q12H  . gabapentin  300 mg Oral QHS  . heparin  5,000 Units Subcutaneous Q8H  . insulin glargine  5 Units Subcutaneous Daily  . pneumococcal 23 valent vaccine  0.5 mL Intramuscular Tomorrow-1000   Continuous Infusions:   LOS: 2 days    Time spent: 62min    Domenic Polite, MD [facility] Page via [email or web address], password [email or web address] After 7PM please contact night-coverage  05/30/2018, 11:26 AM

## 2018-05-30 NOTE — Progress Notes (Signed)
   05/30/18 0900  Clinical Encounter Type  Visited With Patient  Visit Type Initial  Referral From Nurse  Responded to [facility] consult for AD. Gave patient the documentation's and explained them. Informed patient the if he desire to fill out the forms then we can have them notarized on Monday.

## 2018-05-30 NOTE — Progress Notes (Signed)
Progress Note  Patient Name: Melvin Mcclure Date of Encounter: 05/30/2018  Primary Cardiologist:  Stanford Breed  Subjective   Just worried about kidneys still volume overloaded  Inpatient Medications    Scheduled Meds: . amLODipine  10 mg Oral Daily  . aspirin EC  81 mg Oral Daily  . furosemide  80 mg Intravenous Q12H  . gabapentin  300 mg Oral QHS  . heparin  5,000 Units Subcutaneous Q8H  . insulin glargine  5 Units Subcutaneous Daily  . pneumococcal 23 valent vaccine  0.5 mL Intramuscular Tomorrow-1000   Continuous Infusions:  PRN Meds: acetaminophen **OR** acetaminophen, ondansetron **OR** ondansetron (ZOFRAN) IV   Vital Signs    Vitals:   05/29/18 1957 05/30/18 0012 05/30/18 0404 05/30/18 0722  BP: (!) 170/89 (!) 147/81 (!) 155/82 (!) 152/84  Pulse: 74 73 73 69  Resp: 20 18 20 20   Temp: 98.5 F (36.9 C) 98.2 F (36.8 C) (!) 97.3 F (36.3 C)   TempSrc: Oral Oral Oral   SpO2: 95% 96% 92% 97%  Weight:   94.8 kg   Height:        Intake/Output Summary (Last 24 hours) at 05/30/2018 0847 Last data filed at 05/30/2018 0354 Gross per 24 hour  Intake 600 ml  Output 2800 ml  Net -2200 ml   Last 3 Weights 05/30/2018 05/29/2018 05/28/2018  Weight (lbs) 209 lb 1.6 oz 212 lb 3.2 oz 212 lb 14.4 oz  Weight (kg) 94.847 kg 96.253 kg 96.571 kg      Telemetry    NSR 05/30/2018  - Personally Reviewed  ECG    SR LVH with strain  - Personally Reviewed  Physical Exam  Black male  GEN: No acute distress.   Neck: No JVD Cardiac: RRR, no murmurs, rubs, or gallops.  Respiratory: Clear to auscultation bilaterally. GI: Soft, nontender, non-distended  MS: No edema; No deformity. Neuro:  Nonfocal  Psych: Normal affect  Plus 1-2 edema   Labs    Chemistry Recent Labs  Lab 05/28/18 1411 05/28/18 1531 05/29/18 0439 05/30/18 0533  NA 141  --  142 142  K 4.1  --  4.1 4.0  CL 110  --  110 109  CO2 21*  --  22 24  GLUCOSE 120*  --  130* 136*  BUN 23  --  25* 24*    CREATININE 2.29*  --  2.46* 2.56*  CALCIUM 7.2*  --  6.7* 6.8*  PROT  --  8.4*  --   --   ALBUMIN  --  3.5  --   --   AST  --  23  --   --   ALT  --  16  --   --   ALKPHOS  --  86  --   --   BILITOT  --  1.0  --   --   GFRNONAA 28*  --  26* 25*  GFRAA 33*  --  30* 29*  ANIONGAP 10  --  10 9     Hematology Recent Labs  Lab 05/28/18 1411 05/29/18 0439 05/30/18 0533  WBC 10.4 9.0 8.6  RBC 4.29 3.82* 4.06*  HGB 10.8* 10.0* 10.2*  HCT 36.2* 32.1* 34.0*  MCV 84.4 84.0 83.7  MCH 25.2* 26.2 25.1*  MCHC 29.8* 31.2 30.0  RDW 17.1* 16.9* 16.6*  PLT 256 238 240    Cardiac Enzymes Recent Labs  Lab 05/28/18 1832  TROPONINI <0.03    Recent Labs  Lab 05/28/18 1425  TROPIPOC 0.01     BNP Recent Labs  Lab 05/28/18 1525  BNP 534.6*     DDimer No results for input(s): DDIMER in the last 168 hours.   Radiology    Dg Chest Portable 1 View  Result Date: 05/28/2018 CLINICAL DATA:  Dyspnea.  Assess for congestive heart failure. EXAM: PORTABLE CHEST 1 VIEW COMPARISON:  May 20, 2018 FINDINGS: The heart size and mediastinal contours are stable. The heart size is enlarged. Right pleural effusion is noted. Consolidation of the right mid and lung base are noted. There is pulmonary edema. The visualized skeletal structures are stable. IMPRESSION: Congestive heart failure. Right pleural effusion. Patchy consolidation of right lung base at least in part due to atelectasis but superimposed pneumonia is not excluded. Electronically Signed   By: Abelardo Diesel M.D.   On: 05/28/2018 15:50    Cardiac Studies   TTE severe LVH EF 60-65%   Patient Profile     68 y.o. male with significant HTN, CRF seeing Dr Royce Macadamia DM admitted with volume overload And diastolic dysfunction   Assessment & Plan    Diastolic dysfunction: related to HTN, DM and CRF not primary cardiac issue ACE/ARB stopped On admission BP ok if goes up would start hydralazine . Good diuresis over 2 L's with lasix  continue Per Dr Stanford Breed can have outpatient myovue       For questions or updates, please contact [facility] Please consult [email or web address] for contact info under        Signed, Jenkins Rouge, MD  05/30/2018, 8:47 AM

## 2018-05-31 LAB — BASIC METABOLIC PANEL
Anion gap: 7 (ref 5–15)
BUN: 25 mg/dL — ABNORMAL HIGH (ref 8–23)
CO2: 26 mmol/L (ref 22–32)
Calcium: 6.5 mg/dL — ABNORMAL LOW (ref 8.9–10.3)
Chloride: 108 mmol/L (ref 98–111)
Creatinine, Ser: 2.53 mg/dL — ABNORMAL HIGH (ref 0.61–1.24)
GFR calc Af Amer: 29 mL/min — ABNORMAL LOW (ref >60)
GFR calc non Af Amer: 25 mL/min — ABNORMAL LOW (ref >60)
Glucose, Bld: 126 mg/dL — ABNORMAL HIGH (ref 70–99)
Potassium: 3.9 mmol/L (ref 3.5–5.1)
Sodium: 141 mmol/L (ref 135–145)

## 2018-05-31 LAB — GLUCOSE, CAPILLARY
Glucose-Capillary: 109 mg/dL — ABNORMAL HIGH (ref 70–99)
Glucose-Capillary: 99 mg/dL (ref 70–99)
Glucose-Capillary: 92 mg/dL (ref 70–99)
Glucose-Capillary: 92 mg/dL (ref 70–99)

## 2018-05-31 MED ORDER — POTASSIUM CHLORIDE CRYS ER 20 MEQ PO TBCR
40.0000 meq | EXTENDED_RELEASE_TABLET | Freq: Once | ORAL | Status: AC
  Administered 2018-05-31: 40 meq via ORAL
  Filled 2018-05-31: qty 2

## 2018-05-31 MED ORDER — HYDRALAZINE HCL 25 MG PO TABS
25.0000 mg | ORAL_TABLET | Freq: Three times a day (TID) | ORAL | Status: DC
  Administered 2018-05-31 – 2018-06-04 (×12): 25 mg via ORAL
  Filled 2018-05-31 (×13): qty 1

## 2018-05-31 NOTE — Progress Notes (Signed)
Progress Note  Patient Name: Melvin Mcclure Date of Encounter: 05/31/2018  Primary Cardiologist: Dr Stanford Breed  Subjective   No CP; dyspnea improving.  Inpatient Medications    Scheduled Meds: . amLODipine  10 mg Oral Daily  . aspirin EC  81 mg Oral Daily  . furosemide  80 mg Intravenous Q12H  . gabapentin  300 mg Oral QHS  . heparin  5,000 Units Subcutaneous Q8H  . insulin glargine  5 Units Subcutaneous Daily  . pneumococcal 23 valent vaccine  0.5 mL Intramuscular Tomorrow-1000   Continuous Infusions:  PRN Meds: acetaminophen **OR** acetaminophen, ondansetron **OR** ondansetron (ZOFRAN) IV   Vital Signs    Vitals:   05/30/18 1132 05/30/18 1948 05/31/18 0516 05/31/18 0951  BP: (!) 153/80 (!) 160/89 (!) 148/81 (!) 155/85  Pulse: 70 74 75 73  Resp: 20 18 18    Temp:  98.8 F (37.1 C) 99 F (37.2 C)   TempSrc:  Oral Oral   SpO2: 96% 95% 98% 96%  Weight:   93.5 kg   Height:        Intake/Output Summary (Last 24 hours) at 05/31/2018 0957 Last data filed at 05/31/2018 0735 Gross per 24 hour  Intake 840 ml  Output 2200 ml  Net -1360 ml   Last 3 Weights 05/31/2018 05/30/2018 05/29/2018  Weight (lbs) 206 lb 3.2 oz 209 lb 1.6 oz 212 lb 3.2 oz  Weight (kg) 93.532 kg 94.847 kg 96.253 kg      Telemetry    Sinus- Personally Reviewed   Physical Exam   GEN: No acute distress.   Neck: No JVD Cardiac: RRR, no murmurs, rubs, or gallops.  Respiratory: mild basilar crackles GI: Soft, nontender, non-distended  MS: No edema Neuro:  Nonfocal  Psych: Normal affect   Labs    Chemistry Recent Labs  Lab 05/28/18 1531 05/29/18 0439 05/30/18 0533 05/31/18 0447  NA  --  142 142 141  K  --  4.1 4.0 3.9  CL  --  110 109 108  CO2  --  22 24 26   GLUCOSE  --  130* 136* 126*  BUN  --  25* 24* 25*  CREATININE  --  2.46* 2.56* 2.53*  CALCIUM  --  6.7* 6.8* 6.5*  PROT 8.4*  --   --   --   ALBUMIN 3.5  --   --   --   AST 23  --   --   --   ALT 16  --   --   --   ALKPHOS  86  --   --   --   BILITOT 1.0  --   --   --   GFRNONAA  --  26* 25* 25*  GFRAA  --  30* 29* 29*  ANIONGAP  --  10 9 7      Hematology Recent Labs  Lab 05/28/18 1411 05/29/18 0439 05/30/18 0533  WBC 10.4 9.0 8.6  RBC 4.29 3.82* 4.06*  HGB 10.8* 10.0* 10.2*  HCT 36.2* 32.1* 34.0*  MCV 84.4 84.0 83.7  MCH 25.2* 26.2 25.1*  MCHC 29.8* 31.2 30.0  RDW 17.1* 16.9* 16.6*  PLT 256 238 240    Cardiac Enzymes Recent Labs  Lab 05/28/18 1832  TROPONINI <0.03    Recent Labs  Lab 05/28/18 1425  TROPIPOC 0.01     BNP Recent Labs  Lab 05/28/18 1525  BNP 534.6*     Patient Profile     68 year old male with past medical history  of diabetes mellitus, hypertension, chronic stage III kidney disease with acute diastolic congestive heart failure.  Echocardiogram shows normal LV function and severe left ventricular hypertrophy.    Assessment & Plan    1 acute diastolic congestive heart failure-I/O - 1320. Wt 206.  Patient improving but continues with dyspnea.  Continue Lasix 80 mg twice daily.  Follow renal function.  Needs fluid restriction to 1 L daily and low-sodium diet.  Echocardiogram shows preserved LV function and severe left ventricular hypertrophy.  2 acute on chronic stage III kidney disease-continue to follow BUN and creatinine.  Will need close follow-up with nephrology after discharge.  3 hypertension-blood pressure elevated.  Add hydralazine 25 mg p.o. 3 times daily and follow.  4 abnormal electrocardiogram-possible inferior infarct noted.  Patient also with multiple risk factors including 25 years of diabetes mellitus.  Will need nuclear study as an outpatient.  No wall motion is normal on echocardiogram.  He would be at risk for contrast nephropathy if catheterization required.  5 diabetes mellitus-patient would benefit from a statin long-term.  For questions or updates, please contact [facility] Please consult [email or web address] for contact info under          Signed, Kirk Ruths, MD  05/31/2018, 9:57 AM

## 2018-05-31 NOTE — Progress Notes (Signed)
PROGRESS NOTE    Melvin Mcclure  WCB:[id number] DOB: 06-Jun-1950 DOA: 05/28/2018 PCP: Nolene Ebbs, MD  Brief Narrative: Melvin Mcclure is a 68 y.o. male with medical history significant of type 2 diabetes mellitus on insulin, stage III-IV chronic kidney disease, neuropathy was in his usual state of health until approximately 1 week ago, patient was seen in the emergency room for shortness of breath and diagnosed with pneumonia given question infiltrate noted on x-ray then discharged home on antibiotics but reported no improvement, progressively had worsening shortness of breath, orthopnea, dizziness and weakness, repeat x-ray yesterday in the ED was more suggestive of fluid overload/CHF BNP- 500 range, creatinine is 2.2  Assessment & Plan:   New onset acute diastolic CHF -Complicated by stage IV kidney disease -2D echocardiogram with preserved EF, severe LVH and normal wall motion  -Improving with diuresis, he is -3.2 L -Appreciate cardiology input -TSH in normal limits -Dietitian consult  Abnormal EKG, -Q waves in inferior leads, RBBB -Appreciate cards input, stress test recommended after discharge  Type 2 diabetes mellitus -On insulin, CBGs have been running range, likely secondary to CKD -Continue low-dose Lantus  Chronic kidney disease stage IV -Patient reports baseline creatinine in the 2.0 range,  now 2.5 -Was taking PRN NSAIDs at home and also expect a component of cardiorenal syndrome -continue diuresis, monitor  DVT prophylaxis: Subcutaneous heparin Code Status: Full code Family Communication:  Wife at bedside Disposition Plan:  Home pending improvement in volume status  Consultants:   Cardiology   Procedures:   Antimicrobials:    Subjective: -Breathing better, abdomen less swollen as well  Objective: Vitals:   05/30/18 1948 05/31/18 0516 05/31/18 0951 05/31/18 1143  BP: (!) 160/89 (!) 148/81 (!) 155/85 (!) 156/86  Pulse: 74 75 73 72  Resp: 18 18      Temp: 98.8 F (37.1 C) 99 F (37.2 C)  98.4 F (36.9 C)  TempSrc: Oral Oral  Oral  SpO2: 95% 98% 96% 97%  Weight:  93.5 kg    Height:        Intake/Output Summary (Last 24 hours) at 05/31/2018 1158 Last data filed at 05/31/2018 1004 Gross per 24 hour  Intake 1200 ml  Output 2325 ml  Net -1125 ml   Filed Weights   05/29/18 0444 05/30/18 0404 05/31/18 0516  Weight: 96.3 kg 94.8 kg 93.5 kg    Examination: Gen: Awake, Alert, Oriented X 3,  HEENT: PERRLA, Neck supple, no JVD Lungs: Bilateral bibasilar crackles CVS: RRR,No Gallops,Rubs or new Murmurs Abd: soft, Non tender, non distended, BS present Extremities: No edema today Skin: no new rashes Psychiatry: Judgement and insight appear normal. Mood & affect appropriate.     Data Reviewed:   CBC: Recent Labs  Lab 05/28/18 1411 05/29/18 0439 05/30/18 0533  WBC 10.4 9.0 8.6  HGB 10.8* 10.0* 10.2*  HCT 36.2* 32.1* 34.0*  MCV 84.4 84.0 83.7  PLT 256 238 616   Basic Metabolic Panel: Recent Labs  Lab 05/28/18 1411 05/28/18 1525 05/29/18 0439 05/30/18 0533 05/31/18 0447  NA 141  --  142 142 141  K 4.1  --  4.1 4.0 3.9  CL 110  --  110 109 108  CO2 21*  --  22 24 26   GLUCOSE 120*  --  130* 136* 126*  BUN 23  --  25* 24* 25*  CREATININE 2.29*  --  2.46* 2.56* 2.53*  CALCIUM 7.2*  --  6.7* 6.8* 6.5*  MG  --  2.3  --   --   --  GFR: Estimated Creatinine Clearance: 32.1 mL/min (A) (by C-G formula based on SCr of 2.53 mg/dL (H)). Liver Function Tests: Recent Labs  Lab 05/28/18 1531  AST 23  ALT 16  ALKPHOS 86  BILITOT 1.0  PROT 8.4*  ALBUMIN 3.5   No results for input(s): LIPASE, AMYLASE in the last 168 hours. No results for input(s): AMMONIA in the last 168 hours. Coagulation Profile: No results for input(s): INR, PROTIME in the last 168 hours. Cardiac Enzymes: Recent Labs  Lab 05/28/18 1832  TROPONINI <0.03   BNP (last 3 results) No results for input(s): PROBNP in the last 8760  hours. HbA1C: Recent Labs    05/28/18 1832  HGBA1C 6.2*   CBG: Recent Labs  Lab 05/30/18 1129 05/30/18 1622 05/30/18 2145 05/31/18 0734 05/31/18 1141  GLUCAP 162* 83 113* 109* 99   Lipid Profile: No results for input(s): CHOL, HDL, LDLCALC, TRIG, CHOLHDL, LDLDIRECT in the last 72 hours. Thyroid Function Tests: Recent Labs    05/28/18 1832  TSH 2.399   Anemia Panel: No results for input(s): VITAMINB12, FOLATE, FERRITIN, TIBC, IRON, RETICCTPCT in the last 72 hours. Urine analysis:    Component Value Date/Time   COLORURINE YELLOW 03/07/2017 1413   APPEARANCEUR CLEAR 03/07/2017 1413   LABSPEC 1.015 03/07/2017 1413   PHURINE 5.0 03/07/2017 1413   GLUCOSEU >=500 (A) 03/07/2017 1413   HGBUR MODERATE (A) 03/07/2017 1413   BILIRUBINUR NEGATIVE 03/07/2017 1413   KETONESUR 5 (A) 03/07/2017 1413   PROTEINUR 100 (A) 03/07/2017 1413   UROBILINOGEN 0.2 06/17/2010 1620   NITRITE NEGATIVE 03/07/2017 1413   LEUKOCYTESUR NEGATIVE 03/07/2017 1413   Sepsis Labs: @LABRCNTIP (procalcitonin:4,lacticidven:4)  )No results found for this or any previous visit (from the past 240 hour(s)).       Radiology Studies: No results found.      Scheduled Meds: . amLODipine  10 mg Oral Daily  . aspirin EC  81 mg Oral Daily  . furosemide  80 mg Intravenous Q12H  . gabapentin  300 mg Oral QHS  . heparin  5,000 Units Subcutaneous Q8H  . hydrALAZINE  25 mg Oral Q8H  . insulin glargine  5 Units Subcutaneous Daily  . pneumococcal 23 valent vaccine  0.5 mL Intramuscular Tomorrow-1000   Continuous Infusions:   LOS: 3 days    Time spent: 35min    Domenic Polite, MD [facility] Page via [email or web address], password [email or web address] After 7PM please contact night-coverage  05/31/2018, 11:58 AM

## 2018-05-31 NOTE — Care Management Note (Addendum)
Case Management Note  Patient Details  Name: Melvin Mcclure MRN: [id number] Date of Birth: December 12, 1950  Subjective/Objective:  CHF                 Action/Plan: Patient lives at home with spouse who is an Therapist, sports; PCP is Dr Nolene Ebbs; has private insurance with Medicare; pharmacy of choice is [facility]; no DME; spouse is requesting the Physical Therapy eval patient for vertigo. CM will continue to follow for progression of care.  Expected Discharge Date:    possibly 06/04/2018              Expected Discharge Plan:  Home/Self Care  Discharge planning Services  CM Consult   Status of Service:  In process, will continue to follow  Sherrilyn Rist [phone] 05/31/2018, 3:24 PM

## 2018-05-31 NOTE — Progress Notes (Signed)
Paged attending MD for PT order for vertigo. Per patient wife request.  Evaluation and treatment pending.

## 2018-05-31 NOTE — Care Management Important Message (Signed)
Important Message  Patient Details  Name: Melvin Mcclure MRN: [id number] Date of Birth: 25-Mar-1951   Medicare Important Message Given:  Yes    Barb Merino Deyanira Fesler 05/31/2018, 12:38 PM

## 2018-06-01 ENCOUNTER — Inpatient Hospital Stay (HOSPITAL_COMMUNITY): Payer: Medicare Other

## 2018-06-01 LAB — BASIC METABOLIC PANEL
Anion gap: 9 (ref 5–15)
BUN: 27 mg/dL — ABNORMAL HIGH (ref 8–23)
CO2: 27 mmol/L (ref 22–32)
Calcium: 6.5 mg/dL — ABNORMAL LOW (ref 8.9–10.3)
Chloride: 103 mmol/L (ref 98–111)
Creatinine, Ser: 2.73 mg/dL — ABNORMAL HIGH (ref 0.61–1.24)
GFR calc Af Amer: 26 mL/min — ABNORMAL LOW (ref >60)
GFR calc non Af Amer: 23 mL/min — ABNORMAL LOW (ref >60)
Glucose, Bld: 146 mg/dL — ABNORMAL HIGH (ref 70–99)
Potassium: 4 mmol/L (ref 3.5–5.1)
Sodium: 139 mmol/L (ref 135–145)

## 2018-06-01 LAB — CBC
HCT: 36.1 % — ABNORMAL LOW (ref 39.0–52.0)
Hemoglobin: 11.3 g/dL — ABNORMAL LOW (ref 13.0–17.0)
MCH: 25.8 pg — ABNORMAL LOW (ref 26.0–34.0)
MCHC: 31.3 g/dL (ref 30.0–36.0)
MCV: 82.4 fL (ref 80.0–100.0)
Platelets: 315 10*3/uL (ref 150–400)
RBC: 4.38 MIL/uL (ref 4.22–5.81)
RDW: 16 % — ABNORMAL HIGH (ref 11.5–15.5)
WBC: 9.3 10*3/uL (ref 4.0–10.5)
nRBC: 0 % (ref 0.0–0.2)

## 2018-06-01 LAB — GLUCOSE, CAPILLARY
Glucose-Capillary: 110 mg/dL — ABNORMAL HIGH (ref 70–99)
Glucose-Capillary: 200 mg/dL — ABNORMAL HIGH (ref 70–99)
Glucose-Capillary: 138 mg/dL — ABNORMAL HIGH (ref 70–99)
Glucose-Capillary: 162 mg/dL — ABNORMAL HIGH (ref 70–99)

## 2018-06-01 MED ORDER — GABAPENTIN 100 MG PO CAPS
100.0000 mg | ORAL_CAPSULE | Freq: Every day | ORAL | Status: DC
  Administered 2018-06-01 – 2018-06-03 (×3): 100 mg via ORAL
  Filled 2018-06-01 (×3): qty 1

## 2018-06-01 MED ORDER — TORSEMIDE 20 MG PO TABS
20.0000 mg | ORAL_TABLET | Freq: Two times a day (BID) | ORAL | Status: DC
  Administered 2018-06-01 – 2018-06-03 (×5): 20 mg via ORAL
  Filled 2018-06-01 (×5): qty 1

## 2018-06-01 NOTE — Progress Notes (Signed)
PT Cancellation Note  Patient Details Name: Melvin Mcclure MRN: [id number] DOB: [birth year]   Cancelled Treatment:    Reason Eval/Treat Not Completed: Patient at procedure or test/unavailable. Pt off unit for testing. Will check back as schedule allows to initiate PT evaluation.    Thelma Comp 06/01/2018, 10:14 AM   Rolinda Roan, PT, DPT Acute Rehabilitation Services Pager: [phone] Office: [phone]

## 2018-06-01 NOTE — Progress Notes (Signed)
Patients orthostatic vitals are as follows:    06/01/18 1401  Orthostatic Lying   BP- Lying 140/77  Pulse- Lying 74  Orthostatic Sitting  BP- Sitting 116/89  Pulse- Sitting 77  Orthostatic Standing at 0 minutes  BP- Standing at 0 minutes (!) 115/93  Pulse- Standing at 0 minutes 78  Orthostatic Standing at 3 minutes  BP- Standing at 3 minutes 121/77  Pulse- Standing at 3 minutes 80  Oxygen Therapy  SpO2 97 %  O2 Device Nasal Cannula  O2 Flow Rate (L/min) 3 L/min   Patient stated feeling dizzy after orthostatic vitals where done.   Patient currently laying in bed with nasal canula at 3L.

## 2018-06-01 NOTE — Progress Notes (Signed)
Patient was taken to xray by transport in wheelchair with 3 L of oxygen.

## 2018-06-01 NOTE — Evaluation (Signed)
Physical Therapy Evaluation Patient Details Name: Melvin Mcclure MRN: [id number] DOB: 08-28-1950 Today's Date: 06/01/2018   History of Present Illness  Pt is a 68 y/o male witha PMH significant for DM, HTN, CKD III, who presents with acute diastolic congestive heart failure and dizziness.   Clinical Impression  Pt admitted with above diagnosis. Pt currently with functional limitations due to the deficits listed below (see PT Problem List). At the time of PT eval pt was able to perform transfers and ambulation with up to min assist for balance support and safety. Pt on RA during gait training and sats dropped to low-mid 80's. Quick return to >90% when pt put back on 2.5L/min supplemental O2. Pt reports slight dizziness during ambulation when sats decreased but none at rest when sats >90%. Vestibular testing conducted with no obvious horizontal canal or posterior canal involvement. Pt with notable hearing loss and reported constant ringing in R ear. During oculomotor assessment noted saccades with dysmetria. Pt will benefit from skilled PT for gaze stabilization and compensation techniques for dizziness, to increase their independence and safety with mobility to allow discharge to the venue listed below.      Vestibular Assessment - 06/01/18 0001      Vestibular Assessment   General Observation  Pt reports when dizziness hits the world is spinning around him, he gets nauseated and BLE's get weak. Pt reports hearing and feeling his neck "crack" during these times. Pt endorses tinnitus in R ear and decreased hearing in R ear confirmed by finger rub test. Pt reports visual deficits stemming from diabetes, B cataracts, intermittent HA currently behind L eye 4/10 pain, and history of concussion as a child.       Symptom Behavior   Type of Dizziness  "World moves"    Frequency of Dizziness  Unpredicatable. Last was 5 days ago     Duration of Dizziness  Varies    Aggravating Factors  No known aggravating  factors    Relieving Factors  No known relieving factors      Occulomotor Exam   Occulomotor Alignment  Normal    Spontaneous  Absent    Gaze-induced  Absent    Smooth Pursuits  Saccades    Saccades  Poor trajectory;Dysmetria      Positional Testing   Dix-Hallpike  Dix-Hallpike Left;Dix-Hallpike Right    Horizontal Canal Testing  Horizontal Canal Right;Horizontal Canal Left      Dix-Hallpike Right   Dix-Hallpike Right Symptoms  No nystagmus      Dix-Hallpike Left   Dix-Hallpike Left Symptoms  No nystagmus      Horizontal Canal Right   Horizontal Canal Right Symptoms  Normal      Horizontal Canal Left   Horizontal Canal Left Symptoms  Normal      Cognition   Cognition Orientation Level  Oriented x 4    Cognition Comment  A/O x4 however pt appears to have slow processing at times        Follow Up Recommendations Outpatient PT;Supervision for mobility/OOB    Equipment Recommendations  None recommended by PT    Recommendations for Other Services       Precautions / Restrictions Precautions Precautions: Fall Restrictions Weight Bearing Restrictions: No      Mobility  Bed Mobility Overal bed mobility: Modified Independent             General bed mobility comments: Increased time however no assistance required.   Transfers Overall transfer level: Needs assistance Equipment used:  None Transfers: Sit to/from Stand Sit to Stand: Supervision         General transfer comment: Supervision for safety. No assist required.   Ambulation/Gait Ambulation/Gait assistance: Min assist Gait Distance (Feet): 175 Feet Assistive device: None;1 person hand held assist Gait Pattern/deviations: Step-through pattern;Decreased stride length;Shuffle(Decreased floor clearance) Gait velocity: Decreased Gait velocity interpretation: 1.31 - 2.62 ft/sec, indicative of limited community ambulator General Gait Details: VC's for pursed-lip breathing. Pt unsteady and required  occasional min assist to gain/maintain balance.   Stairs            Wheelchair Mobility    Modified Rankin (Stroke Patients Only)       Balance Overall balance assessment: Needs assistance Sitting-balance support: Feet supported Sitting balance-Leahy Scale: Fair     Standing balance support: No upper extremity supported;During functional activity Standing balance-Leahy Scale: Poor Standing balance comment: Occasional UE support/external assist required                             Pertinent Vitals/Pain      Home Living Family/patient expects to be discharged to:: Private residence Living Arrangements: Spouse/significant other Available Help at Discharge: Family Type of Home: House Home Access: Level entry     Home Layout: One level Home Equipment: None      Prior Function Level of Independence: Needs assistance         Comments: Pt reports he was not using an AD PTA however when dizzy spells hit he required assistance to get up out of the bed and go to the dr. Vita Barley pt has still been attempting to drive despite dizzy spells.     Hand Dominance        Extremity/Trunk Assessment   Upper Extremity Assessment Upper Extremity Assessment: Defer to OT evaluation    Lower Extremity Assessment Lower Extremity Assessment: Generalized weakness    Cervical / Trunk Assessment Cervical / Trunk Assessment: Other exceptions Cervical / Trunk Exceptions: Noted decreased cervical AROM during vestibular testing.   Communication   Communication: No difficulties  Cognition Arousal/Alertness: Awake/alert Behavior During Therapy: WFL for tasks assessed/performed Overall Cognitive Status: Impaired/Different from baseline Area of Impairment: Safety/judgement;Awareness;Problem solving;Memory                     Memory: Decreased short-term memory   Safety/Judgement: Decreased awareness of safety;Decreased awareness of deficits Awareness:  Emergent Problem Solving: Slow processing        General Comments      Exercises     Assessment/Plan    PT Assessment Patient needs continued PT services  PT Problem List Decreased strength;Decreased activity tolerance;Decreased balance;Decreased mobility;Decreased knowledge of use of DME;Decreased safety awareness;Decreased knowledge of precautions;Cardiopulmonary status limiting activity       PT Treatment Interventions DME instruction;Stair training;Functional mobility training;Gait training;Therapeutic activities;Therapeutic exercise;Neuromuscular re-education;Patient/family education    PT Goals (Current goals can be found in the Care Plan section)  Acute Rehab PT Goals Patient Stated Goal: Stop feeling dizzy; voices frustration with his current physical function and states hed like to be more athletic again.  PT Goal Formulation: With patient/family Time For Goal Achievement: 06/15/18 Potential to Achieve Goals: Good    Frequency Min 3X/week   Barriers to discharge        Co-evaluation               AM-PAC PT "6 Clicks" Mobility  Outcome Measure Help needed turning from your back to  your side while in a flat bed without using bedrails?: None Help needed moving from lying on your back to sitting on the side of a flat bed without using bedrails?: None Help needed moving to and from a bed to a chair (including a wheelchair)?: None Help needed standing up from a chair using your arms (e.g., wheelchair or bedside chair)?: None Help needed to walk in hospital room?: A Little Help needed climbing 3-5 steps with a railing? : A Little 6 Click Score: 22    End of Session Equipment Utilized During Treatment: Oxygen Activity Tolerance: Patient tolerated treatment well Patient left: in chair;with call bell/phone within reach;with family/visitor present Nurse Communication: Mobility status PT Visit Diagnosis: Unsteadiness on feet (R26.81);Dizziness and giddiness  (R42);Muscle weakness (generalized) (M62.81)    Time: 3149-7026 PT Time Calculation (min) (ACUTE ONLY): 46 min   Charges:   PT Evaluation $PT Eval Moderate Complexity: 1 Mod PT Treatments $Gait Training: 8-22 mins $Physical Performance Test: 8-22 mins        Rolinda Roan, PT, DPT Acute Rehabilitation Services Pager: [phone] Office: [phone]   Thelma Comp 06/01/2018, 2:09 PM

## 2018-06-01 NOTE — Progress Notes (Signed)
Patient is currently ambulating with physical therapy.

## 2018-06-01 NOTE — Progress Notes (Addendum)
Progress Note  Patient Name: Melvin Mcclure Date of Encounter: 06/01/2018  Primary Cardiologist: Dr Stanford Breed  Subjective   Denies CP; dyspnea improving; complains of "weak"; mildly dizzy  Inpatient Medications    Scheduled Meds: . amLODipine  10 mg Oral Daily  . aspirin EC  81 mg Oral Daily  . furosemide  80 mg Intravenous Q12H  . gabapentin  300 mg Oral QHS  . heparin  5,000 Units Subcutaneous Q8H  . hydrALAZINE  25 mg Oral Q8H  . insulin glargine  5 Units Subcutaneous Daily  . pneumococcal 23 valent vaccine  0.5 mL Intramuscular Tomorrow-1000   Continuous Infusions:  PRN Meds: acetaminophen **OR** acetaminophen, ondansetron **OR** ondansetron (ZOFRAN) IV   Vital Signs    Vitals:   05/31/18 1143 05/31/18 1330 05/31/18 2210 06/01/18 0526  BP: (!) 156/86 (!) 153/84 (!) 151/86 (!) 142/84  Pulse: 72 77 76 79  Resp:   20 18  Temp: 98.4 F (36.9 C)  98.7 F (37.1 C) 99.8 F (37.7 C)  TempSrc: Oral  Oral Oral  SpO2: 97% 97% 96% 97%  Weight:    92.4 kg  Height:        Intake/Output Summary (Last 24 hours) at 06/01/2018 0824 Last data filed at 06/01/2018 0600 Gross per 24 hour  Intake 960 ml  Output 2150 ml  Net -1190 ml   Last 3 Weights 06/01/2018 05/31/2018 05/30/2018  Weight (lbs) 203 lb 12.8 oz 206 lb 3.2 oz 209 lb 1.6 oz  Weight (kg) 92.443 kg 93.532 kg 94.847 kg      Telemetry    Sinus- Personally Reviewed   Physical Exam   GEN: No acute distress. WD/WN   Neck: supple Cardiac: RRR Respiratory: CTA GI: Soft, NT/ND MS: No edema Neuro:  Grossly intact   Labs    Chemistry Recent Labs  Lab 05/28/18 1531  05/30/18 0533 05/31/18 0447 06/01/18 0411  NA  --    < > 142 141 139  K  --    < > 4.0 3.9 4.0  CL  --    < > 109 108 103  CO2  --    < > 24 26 27   GLUCOSE  --    < > 136* 126* 146*  BUN  --    < > 24* 25* 27*  CREATININE  --    < > 2.56* 2.53* 2.73*  CALCIUM  --    < > 6.8* 6.5* 6.5*  PROT 8.4*  --   --   --   --   ALBUMIN 3.5  --   --    --   --   AST 23  --   --   --   --   ALT 16  --   --   --   --   ALKPHOS 86  --   --   --   --   BILITOT 1.0  --   --   --   --   GFRNONAA  --    < > 25* 25* 23*  GFRAA  --    < > 29* 29* 26*  ANIONGAP  --    < > 9 7 9    < > = values in this interval not displayed.     Hematology Recent Labs  Lab 05/28/18 1411 05/29/18 0439 05/30/18 0533  WBC 10.4 9.0 8.6  RBC 4.29 3.82* 4.06*  HGB 10.8* 10.0* 10.2*  HCT 36.2* 32.1* 34.0*  MCV 84.4 84.0  83.7  MCH 25.2* 26.2 25.1*  MCHC 29.8* 31.2 30.0  RDW 17.1* 16.9* 16.6*  PLT 256 238 240    Cardiac Enzymes Recent Labs  Lab 05/28/18 1832  TROPONINI <0.03    Recent Labs  Lab 05/28/18 1425  TROPIPOC 0.01     BNP Recent Labs  Lab 05/28/18 1525  BNP 534.6*     Patient Profile     68 year old male with past medical history of diabetes mellitus, hypertension, chronic stage III kidney disease with acute diastolic congestive heart failure.  Echocardiogram shows normal LV function and severe left ventricular hypertrophy.    Assessment & Plan    1 acute diastolic congestive heart failure-I/O - 1490. Wt 203.8.  Dyspnea has improved and he is not volume overloaded on examination.  Change Lasix to Demadex 20 mg twice daily.  Follow renal function.  Needs fluid restriction to 1 L daily and low-sodium diet.  Echocardiogram shows preserved LV function and severe left ventricular hypertrophy.  Repeat PA and lateral chest x-ray.  Patient complains of weakness today.  Question deconditioning.  No gross findings on neurological examination.  Begin to ambulate and follow.  2 acute on chronic stage III kidney disease-recheck BMET today.  3 hypertension-blood pressure remains mildly elevated.  Hydralazine added yesterday.  We will follow and advance regimen as needed.  4 abnormal electrocardiogram-possible inferior infarct noted.  Patient also with multiple risk factors including 25 years of diabetes mellitus.  Will need nuclear study as  an outpatient.  No wall motion is normal on echocardiogram.  He would be at risk for contrast nephropathy if catheterization required.  5 diabetes mellitus-patient would benefit from a statin long-term.  For questions or updates, please contact [place] Please consult [email or web address] for contact info under        Signed, Kirk Ruths, MD  06/01/2018, 8:24 AM

## 2018-06-01 NOTE — Progress Notes (Signed)
PROGRESS NOTE    Melvin Mcclure  IZT:[id number] DOB: [birth year] DOA: 05/28/2018 PCP: Nolene Ebbs, MD  Brief Narrative: Melvin Mcclure is a 68 y.o. male with medical history significant of type 2 diabetes mellitus on insulin, stage III-IV chronic kidney disease, neuropathy was in his usual state of health until approximately 1 week ago, patient was seen in the emergency room for shortness of breath and diagnosed with pneumonia given question infiltrate noted on x-ray then discharged home on antibiotics but reported no improvement, progressively had worsening shortness of breath, orthopnea, dizziness and weakness, repeat x-ray  in the ED was more suggestive of fluid overload/CHF BNP- 500 range, creatinine is 2.2. -Admitted with new onset CHF, diastolic, started on diuretics, cardiology following, improving  Subjective: Reports feeling bad this morning, had nausea, dizziness, breathing is slowly improving  Assessment & Plan:   New onset acute diastolic CHF -Background of stage III-IV chronic kidney disease -2D echocardiogram noted preserved EF, severe LVH and normal wall motion -Improving with diuresis, he is -4.7 L -Appreciate cardiology input, TSH within normal limits -Today reports ongoing intermittent dizziness, check orthostatic vitals -Dietitian consulted, repeat x-ray  Abnormal EKG, -Q waves in inferior leads, RBBB -Appreciate cards input, stress test recommended after discharge  Type 2 diabetes mellitus -On insulin, CBGs have been running range, likely secondary to CKD -Continue low-dose Lantus  Chronic kidney disease stage IV -Patient reports baseline creatinine in the 2.0 range,  now 2.5 -Was taking PRN NSAIDs at home and also expect a component of cardiorenal syndrome -continue diuresis, monitor  DVT prophylaxis: Subcutaneous heparin Code Status: Full code Family Communication:  Wife at bedside Disposition Plan:  Home pending improvement in volume  status  Consultants:   Cardiology   Procedures:   Antimicrobials:   Objective: Vitals:   05/31/18 2210 06/01/18 0526 06/01/18 1020 06/01/18 1401  BP: (!) 151/86 (!) 142/84 (!) 156/81   Pulse: 76 79    Resp: 20 18    Temp: 98.7 F (37.1 C) 99.8 F (37.7 C)    TempSrc: Oral Oral    SpO2: 96% 97%  97%  Weight:  92.4 kg    Height:        Intake/Output Summary (Last 24 hours) at 06/01/2018 1414 Last data filed at 06/01/2018 1030 Gross per 24 hour  Intake 960 ml  Output 2325 ml  Net -1365 ml   Filed Weights   05/30/18 0404 05/31/18 0516 06/01/18 0526  Weight: 94.8 kg 93.5 kg 92.4 kg    Examination: Gen: Awake, Alert, Oriented X 3, pleasant, uncomfortable appearing, no distress HEENT: PERRLA, Neck supple, no JVD Lungs: Fine bibasilar crackles noted CVS: RRR,No Gallops,Rubs or new Murmurs Abd: soft, Non tender, non distended, BS present Extremities:  no edema Skin: no new rashes Psychiatry: Judgement and insight appear normal. Mood & affect appropriate.     Data Reviewed:   CBC: Recent Labs  Lab 05/28/18 1411 05/29/18 0439 05/30/18 0533 06/01/18 0930  WBC 10.4 9.0 8.6 9.3  HGB 10.8* 10.0* 10.2* 11.3*  HCT 36.2* 32.1* 34.0* 36.1*  MCV 84.4 84.0 83.7 82.4  PLT 256 238 240 382   Basic Metabolic Panel: Recent Labs  Lab 05/28/18 1411 05/28/18 1525 05/29/18 0439 05/30/18 0533 05/31/18 0447 06/01/18 0411  NA 141  --  142 142 141 139  K 4.1  --  4.1 4.0 3.9 4.0  CL 110  --  110 109 108 103  CO2 21*  --  22 24 26 27   GLUCOSE 120*  --  130* 136* 126* 146*  BUN 23  --  25* 24* 25* 27*  CREATININE 2.29*  --  2.46* 2.56* 2.53* 2.73*  CALCIUM 7.2*  --  6.7* 6.8* 6.5* 6.5*  MG  --  2.3  --   --   --   --    GFR: Estimated Creatinine Clearance: 29.6 mL/min (A) (by C-G formula based on SCr of 2.73 mg/dL (H)). Liver Function Tests: Recent Labs  Lab 05/28/18 1531  AST 23  ALT 16  ALKPHOS 86  BILITOT 1.0  PROT 8.4*  ALBUMIN 3.5   No results for  input(s): LIPASE, AMYLASE in the last 168 hours. No results for input(s): AMMONIA in the last 168 hours. Coagulation Profile: No results for input(s): INR, PROTIME in the last 168 hours. Cardiac Enzymes: Recent Labs  Lab 05/28/18 1832  TROPONINI <0.03   BNP (last 3 results) No results for input(s): PROBNP in the last 8760 hours. HbA1C: No results for input(s): HGBA1C in the last 72 hours. CBG: Recent Labs  Lab 05/31/18 1141 05/31/18 1703 05/31/18 2206 06/01/18 0734 06/01/18 1111  GLUCAP 99 92 92 110* 200*   Lipid Profile: No results for input(s): CHOL, HDL, LDLCALC, TRIG, CHOLHDL, LDLDIRECT in the last 72 hours. Thyroid Function Tests: No results for input(s): TSH, T4TOTAL, FREET4, T3FREE, THYROIDAB in the last 72 hours. Anemia Panel: No results for input(s): VITAMINB12, FOLATE, FERRITIN, TIBC, IRON, RETICCTPCT in the last 72 hours. Urine analysis:    Component Value Date/Time   COLORURINE YELLOW 03/07/2017 1413   APPEARANCEUR CLEAR 03/07/2017 1413   LABSPEC 1.015 03/07/2017 1413   PHURINE 5.0 03/07/2017 1413   GLUCOSEU >=500 (A) 03/07/2017 1413   HGBUR MODERATE (A) 03/07/2017 1413   BILIRUBINUR NEGATIVE 03/07/2017 1413   KETONESUR 5 (A) 03/07/2017 1413   PROTEINUR 100 (A) 03/07/2017 1413   UROBILINOGEN 0.2 06/17/2010 1620   NITRITE NEGATIVE 03/07/2017 1413   LEUKOCYTESUR NEGATIVE 03/07/2017 1413   Sepsis Labs: @LABRCNTIP (procalcitonin:4,lacticidven:4)  )No results found for this or any previous visit (from the past 240 hour(s)).       Radiology Studies: Dg Chest 2 View  Result Date: 06/01/2018 CLINICAL DATA:  CHF EXAM: CHEST - 2 VIEW COMPARISON:  05/28/2018 FINDINGS: Bilateral mild interstitial thickening. Trace right pleural effusion. No pneumothorax. Stable cardiomediastinal silhouette. No aggressive osseous lesion. IMPRESSION: Mild CHF. Electronically Signed   By: Kathreen Devoid   On: 06/01/2018 11:24        Scheduled Meds: . amLODipine  10 mg Oral  Daily  . aspirin EC  81 mg Oral Daily  . gabapentin  100 mg Oral QHS  . heparin  5,000 Units Subcutaneous Q8H  . hydrALAZINE  25 mg Oral Q8H  . insulin glargine  5 Units Subcutaneous Daily  . pneumococcal 23 valent vaccine  0.5 mL Intramuscular Tomorrow-1000  . torsemide  20 mg Oral BID   Continuous Infusions:   LOS: 4 days    Time spent: 64min    Domenic Polite, MD [facility] Page via [email or web address], password [email or web address] After 7PM please contact night-coverage  06/01/2018, 2:14 PM

## 2018-06-02 LAB — GLUCOSE, CAPILLARY
Glucose-Capillary: 129 mg/dL — ABNORMAL HIGH (ref 70–99)
Glucose-Capillary: 202 mg/dL — ABNORMAL HIGH (ref 70–99)
Glucose-Capillary: 147 mg/dL — ABNORMAL HIGH (ref 70–99)
Glucose-Capillary: 160 mg/dL — ABNORMAL HIGH (ref 70–99)

## 2018-06-02 LAB — BASIC METABOLIC PANEL
Anion gap: 11 (ref 5–15)
BUN: 32 mg/dL — ABNORMAL HIGH (ref 8–23)
CO2: 28 mmol/L (ref 22–32)
Calcium: 6.3 mg/dL — CL (ref 8.9–10.3)
Chloride: 100 mmol/L (ref 98–111)
Creatinine, Ser: 2.72 mg/dL — ABNORMAL HIGH (ref 0.61–1.24)
GFR calc Af Amer: 27 mL/min — ABNORMAL LOW (ref >60)
GFR calc non Af Amer: 23 mL/min — ABNORMAL LOW (ref >60)
Glucose, Bld: 146 mg/dL — ABNORMAL HIGH (ref 70–99)
Potassium: 4.1 mmol/L (ref 3.5–5.1)
Sodium: 139 mmol/L (ref 135–145)

## 2018-06-02 MED ORDER — CALCIUM CARBONATE 1250 (500 CA) MG PO TABS
1.0000 | ORAL_TABLET | Freq: Two times a day (BID) | ORAL | Status: DC
  Administered 2018-06-02 – 2018-06-04 (×4): 500 mg via ORAL
  Filled 2018-06-02 (×4): qty 1

## 2018-06-02 NOTE — Progress Notes (Signed)
PROGRESS NOTE    Melvin Mcclure  EHU:[id number] DOB: Sep 28, 1950 DOA: 05/28/2018 PCP: Nolene Ebbs, MD  Brief Narrative: Melvin Mcclure is a 68 y.o. male with medical history significant of type 2 diabetes mellitus on insulin, stage III-IV chronic kidney disease, neuropathy was in his usual state of health until approximately 1 week ago, patient was seen in the emergency room for shortness of breath and diagnosed with pneumonia given question infiltrate noted on x-ray then discharged home on antibiotics but reported no improvement, progressively had worsening shortness of breath, orthopnea, dizziness and weakness, repeat x-ray  in the ED was more suggestive of fluid overload/CHF BNP- 500 range, creatinine is 2.2. -Admitted with new onset diastolic CHF started on diuretics, cardiology following, improving  Subjective: Feels weak, breathing is improving  Assessment & Plan:   New onset acute diastolic CHF -Background of stage III-IV chronic kidney disease -2D echocardiogram noted preserved EF, severe LVH and normal wall motion -Improving with diuresis, he is 6.4 L negative -Appreciate cardiology input, TSH within normal limits -Repeat x-ray yesterday with mild CHF -Transitioned to oral torsemide -Monitor B met -Ambulate, wean off O2 today  Abnormal EKG, -Q waves in inferior leads, RBBB -Appreciate cards input, stress test recommended after discharge  Type 2 diabetes mellitus -On insulin, CBGs have been running range, likely secondary to CKD -Continue low-dose Lantus  Chronic kidney disease stage IV -Patient reports baseline creatinine in the 2.0 range,  now 2.5 -Was taking PRN NSAIDs at home and also expect a component of cardiorenal syndrome -continue diuresis, monitor -Followed by Dr. Royce Macadamia with [facility]  Recent history of vertigo -Associated with hearing loss and ringing in his right ear -Symptoms concerning for Mnire's disease -Recommended outpatient  follow-up with ENT -Physical therapy evaluation completed  DVT prophylaxis: Subcutaneous heparin Code Status: Full code Family Communication:  Wife at bedside Disposition Plan:  Home possibly tomorrow pending improvement in volume status  Consultants:   Cardiology   Procedures:   Antimicrobials:   Objective: Vitals:   06/01/18 1930 06/01/18 2120 06/02/18 0533 06/02/18 0803  BP: 121/73 128/79 (!) 152/89 138/83  Pulse: (!) 58 76 77 76  Resp: _0 Temp: 99.8 F (37.7 C) 98.6 F (37 C) 98.5 F (36.9 C) 98.7 F (37.1 C)  TempSrc: Oral Oral Oral Oral  SpO2: 98% 99% 97% 100%  Weight:   93.3 kg   Height:        Intake/Output Summary (Last 24 hours) at 06/02/2018 1155 Last data filed at 06/02/2018 0900 Gross per 24 hour  Intake 720 ml  Output 1700 ml  Net -980 ml   Filed Weights   05/31/18 0516 06/01/18 0526 06/02/18 0533  Weight: 93.5 kg 92.4 kg 93.3 kg    Examination: Gen: Awake, Alert, Oriented X 3,  HEENT: PERRLA, Neck supple, no JVD Lungs: Few basilar crackles  CVS: RRR,No Gallops,Rubs or new Murmurs Abd: soft, Non tender, non distended, BS present Extremities: No edema Skin: no new rashes Psychiatry: Judgement and insight appear normal. Mood & affect appropriate.     Data Reviewed:   CBC: Recent Labs  Lab 05/28/18 1411 05/29/18 0439 05/30/18 0533 06/01/18 0930  WBC 10.4 9.0 8.6 9.3  HGB 10.8* 10.0* 10.2* 11.3*  HCT 36.2* 32.1* 34.0* 36.1*  MCV 84.4 84.0 83.7 82.4  PLT 256 238 240 785   Basic Metabolic Panel: Recent Labs  Lab 05/28/18 1525 05/29/18 0439 05/30/18 0533 05/31/18 0447 06/01/18 0411 06/02/18 0745  NA  --  142 142 141 139 139  K  --  4.1 4.0 3.9 4.0 4.1  CL  --  110 109 108 103 100  CO2  --  _0 GLUCOSE  --  130* 136* 126* 146* 146*  BUN  --  25* 24* 25* 27* 32*  CREATININE  --  2.46* 2.56* 2.53* 2.73* 2.72*  CALCIUM  --  6.7* 6.8* 6.5* 6.5* 6.3*  MG 2.3  --   --   --   --   --    GFR: Estimated  Creatinine Clearance: 29.8 mL/min (A) (by C-G formula based on SCr of 2.72 mg/dL (H)). Liver Function Tests: Recent Labs  Lab 05/28/18 1531  AST 23  ALT 16  ALKPHOS 86  BILITOT 1.0  PROT 8.4*  ALBUMIN 3.5   No results for input(s): LIPASE, AMYLASE in the last 168 hours. No results for input(s): AMMONIA in the last 168 hours. Coagulation Profile: No results for input(s): INR, PROTIME in the last 168 hours. Cardiac Enzymes: Recent Labs  Lab 05/28/18 1832  TROPONINI <0.03   BNP (last 3 results) No results for input(s): PROBNP in the last 8760 hours. HbA1C: No results for input(s): HGBA1C in the last 72 hours. CBG: Recent Labs  Lab 06/01/18 0734 06/01/18 1111 06/01/18 1614 06/01/18 2322 06/02/18 0752  GLUCAP 110* 200* 138* 162* 129*   Lipid Profile: No results for input(s): CHOL, HDL, LDLCALC, TRIG, CHOLHDL, LDLDIRECT in the last 72 hours. Thyroid Function Tests: No results for input(s): TSH, T4TOTAL, FREET4, T3FREE, THYROIDAB in the last 72 hours. Anemia Panel: No results for input(s): VITAMINB12, FOLATE, FERRITIN, TIBC, IRON, RETICCTPCT in the last 72 hours. Urine analysis:    Component Value Date/Time   COLORURINE YELLOW 03/07/2017 1413   APPEARANCEUR CLEAR 03/07/2017 1413   LABSPEC 1.015 03/07/2017 1413   PHURINE 5.0 03/07/2017 1413   GLUCOSEU >=500 (A) 03/07/2017 1413   HGBUR MODERATE (A) 03/07/2017 1413   BILIRUBINUR NEGATIVE 03/07/2017 1413   KETONESUR 5 (A) 03/07/2017 1413   PROTEINUR 100 (A) 03/07/2017 1413   UROBILINOGEN 0.2 06/17/2010 1620   NITRITE NEGATIVE 03/07/2017 1413   LEUKOCYTESUR NEGATIVE 03/07/2017 1413   Sepsis Labs: _1 (procalcitonin:4,lacticidven:4)  )No results found for this or any previous visit (from the past 240 hour(s)).       Radiology Studies: Dg Chest 2 View  Result Date: 06/01/2018 CLINICAL DATA:  CHF EXAM: CHEST - 2 VIEW COMPARISON:  05/28/2018 FINDINGS: Bilateral mild interstitial thickening. Trace right  pleural effusion. No pneumothorax. Stable cardiomediastinal silhouette. No aggressive osseous lesion. IMPRESSION: Mild CHF. Electronically Signed   By: Kathreen Devoid   On: 06/01/2018 11:24        Scheduled Meds: . amLODipine  10 mg Oral Daily  . aspirin EC  81 mg Oral Daily  . calcium carbonate  1 tablet Oral BID WC  . gabapentin  100 mg Oral QHS  . heparin  5,000 Units Subcutaneous Q8H  . hydrALAZINE  25 mg Oral Q8H  . insulin glargine  5 Units Subcutaneous Daily  . torsemide  20 mg Oral BID   Continuous Infusions:   LOS: 5 days    Time spent: 14mn    PDomenic Polite MD [facility]  06/02/2018, 11:55 AM

## 2018-06-02 NOTE — Progress Notes (Signed)
Patient calcium 6.3. MD notified, awaiting on order. Will continue to monitor patient.

## 2018-06-02 NOTE — Clinical Social Work Note (Signed)
Emailed Development worker, community to see if they could provide information on how to apply for disability per family request late yesterday afternoon.  Melvin Mcclure, [place]

## 2018-06-02 NOTE — Plan of Care (Signed)
Nutrition Education Note  RD consulted for nutrition education regarding new onset CHF.  Spoke with pt and wife at bedside. Pt reports her tries to eat healthfully, however, has reverted back to some old habits since moving from [place] back to [place]. Pt reports he does not consume processed foods or salt his foods, but occasionally consumes high sodium, high fat foods. Pt reports he enjoys cooking, but also often eats out. Breakfast typically is grits, eggs, and bacon, Lunch is salad, and dinner is a meat, starch, and vegetable. Pt expressed frustration over multiple illness, including DM, kidney disease, vertigo, and CHF and wonders if there are any correlations of diet habits between these two. Spent most of the visit discussing how to best manage DM and CHF (focusing on low sodium diet) to best manage these conditions as well as decrease possibility of progression to ESRD, as uncontrolled DM and HTN are the two main risk factors to ESRD.   RD provided "Low Sodium Nutrition Therapy" handout from the Academy of [employer]. Reviewed patient's dietary recall. Provided examples on ways to decrease sodium intake in diet. Discouraged intake of processed foods and use of salt shaker. Encouraged fresh fruits and vegetables as well as whole grain sources of carbohydrates to maximize fiber intake.   RD discussed why it is important for patient to adhere to diet recommendations, and emphasized the role of fluids, foods to avoid, and importance of weighing self daily. Teach back method used.  Expect fair to good compliance.  Body mass index is 29.51 kg/m. Pt meets criteria for overweight based on current BMI.  Current diet order is heart healthy/ carb modified, patient is consuming approximately 100% of meals at this time. Labs and medications reviewed. No further nutrition interventions warranted at this time. RD contact information provided. If additional nutrition issues arise, please  re-consult RD.   Reuven Braver A. Jimmye Norman, RD, LDN, CDE Pager: [phone] After hours Pager: [phone]

## 2018-06-02 NOTE — Progress Notes (Addendum)
Progress Note  Patient Name: Melvin Mcclure Date of Encounter: 06/02/2018  Primary Cardiologist: Kirk Ruths, MD   Subjective   Eating better.  He was able to sleep last night without any orthopnea or PND which is much improved since prior to admission.  He denies any chest pain/pressure.  She is still using oxygen, 1 L.  Inpatient Medications    Scheduled Meds: . amLODipine  10 mg Oral Daily  . aspirin EC  81 mg Oral Daily  . gabapentin  100 mg Oral QHS  . heparin  5,000 Units Subcutaneous Q8H  . hydrALAZINE  25 mg Oral Q8H  . insulin glargine  5 Units Subcutaneous Daily  . torsemide  20 mg Oral BID   Continuous Infusions:  PRN Meds: acetaminophen **OR** acetaminophen, ondansetron **OR** ondansetron (ZOFRAN) IV   Vital Signs    Vitals:   06/01/18 1930 06/01/18 2120 06/02/18 0533 06/02/18 0803  BP: 121/73 128/79 (!) 152/89 138/83  Pulse: (!) 58 76 77 76  Resp: 18 18 18 18   Temp: 99.8 F (37.7 C) 98.6 F (37 C) 98.5 F (36.9 C) 98.7 F (37.1 C)  TempSrc: Oral Oral Oral Oral  SpO2: 98% 99% 97% 100%  Weight:   93.3 kg   Height:        Intake/Output Summary (Last 24 hours) at 06/02/2018 0911 Last data filed at 06/02/2018 0556 Gross per 24 hour  Intake 840 ml  Output 2300 ml  Net -1460 ml   Last 3 Weights 06/02/2018 06/01/2018 05/31/2018  Weight (lbs) 205 lb 11.2 oz 203 lb 12.8 oz 206 lb 3.2 oz  Weight (kg) 93.305 kg 92.443 kg 93.532 kg      Telemetry    Sinus rhythm in the 70s- Personally Reviewed  ECG    No new tracings- Personally Reviewed  Physical Exam   GEN: No acute distress.   Neck: No JVD Cardiac: RRR, no murmurs, rubs, or gallops.  Respiratory: Clear to auscultation bilaterally. GI: Soft, nontender, non-distended  MS: No edema; No deformity. Neuro:  Nonfocal  Psych: Normal affect   Labs    Chemistry Recent Labs  Lab 05/28/18 1531  05/30/18 0533 05/31/18 0447 06/01/18 0411  NA  --    < > 142 141 139  K  --    < > 4.0 3.9 4.0    CL  --    < > 109 108 103  CO2  --    < > 24 26 27   GLUCOSE  --    < > 136* 126* 146*  BUN  --    < > 24* 25* 27*  CREATININE  --    < > 2.56* 2.53* 2.73*  CALCIUM  --    < > 6.8* 6.5* 6.5*  PROT 8.4*  --   --   --   --   ALBUMIN 3.5  --   --   --   --   AST 23  --   --   --   --   ALT 16  --   --   --   --   ALKPHOS 86  --   --   --   --   BILITOT 1.0  --   --   --   --   GFRNONAA  --    < > 25* 25* 23*  GFRAA  --    < > 29* 29* 26*  ANIONGAP  --    < > 9 7 9    < > =  values in this interval not displayed.     Hematology Recent Labs  Lab 05/29/18 0439 05/30/18 0533 06/01/18 0930  WBC 9.0 8.6 9.3  RBC 3.82* 4.06* 4.38  HGB 10.0* 10.2* 11.3*  HCT 32.1* 34.0* 36.1*  MCV 84.0 83.7 82.4  MCH 26.2 25.1* 25.8*  MCHC 31.2 30.0 31.3  RDW 16.9* 16.6* 16.0*  PLT 238 240 315    Cardiac Enzymes Recent Labs  Lab 05/28/18 1832  TROPONINI <0.03    Recent Labs  Lab 05/28/18 1425  TROPIPOC 0.01     BNP Recent Labs  Lab 05/28/18 1525  BNP 534.6*     DDimer No results for input(s): DDIMER in the last 168 hours.   Radiology    Dg Chest 2 View  Result Date: 06/01/2018 CLINICAL DATA:  CHF EXAM: CHEST - 2 VIEW COMPARISON:  05/28/2018 FINDINGS: Bilateral mild interstitial thickening. Trace right pleural effusion. No pneumothorax. Stable cardiomediastinal silhouette. No aggressive osseous lesion. IMPRESSION: Mild CHF. Electronically Signed   By: Kathreen Devoid   On: 06/01/2018 11:24    Cardiac Studies   Echocardiogram 05/29/2018 Study Conclusions - Procedure narrative: Transthoracic echocardiography. Image   quality was suboptimal. The study was technically difficult, as a   result of poor sound wave transmission. Intravenous contrast   (Definity) was administered. - Left ventricle: The cavity size was normal. Wall thickness was   increased in a pattern of severe LVH. Systolic function was   normal. The estimated ejection fraction was in the range of 60%   to 65%. Wall  motion was normal; there were no regional wall   motion abnormalities. Left ventricular diastolic function   parameters were normal. - Atrial septum: No defect or patent foramen ovale was identified.   Patient Profile     68 y.o. male with past medical history of diabetes mellitus, hypertension, chronic stage III kidney disease with acutediastolic congestive heart failure. Echocardiogram shows normal LV function and severe left ventricular hypertrophy.   Assessment & Plan    Acute diastolic CHF -Echocardiogram shows preserved LV function and severe LVH. -Lasix changed to Demadex 20 mg twice daily. He got lasix 80 mg IV as well as torsemide 20 mg BID yesterday with good UOP of 2.3L. He is net -6 L fluid balance since admission.  Weight is up just over a pound since yesterday, down 7 pounds since admission. -Fluid restriction of 1 L/day and low-sodium diet.  Acute on chronic stage III kidney disease -Serum creatinine 2.73 yesterday, awaiting results of today's labs  Hypertension -He continues on amlodipine.  Hydralazine has been added.  BP has improved.  Abnormal EKG -EKG showed possible inferior infarct.  No regional wall motion abnormalities on echo. -Patient has multiple risk factors including 25 years of diabetes.  Plan for outpatient nuclear study.  Patient would be at risk for contrast nephropathy if cardiac cath required.  Diabetes type 2 on insulin -A1c is 6.2, adequately controlled. -Patient would benefit from a statin long-term.    For questions or updates, please contact [place] Please consult [email or web address] for contact info under        Signed, Daune Perch, NP  06/02/2018, 9:11 AM   As above, patient seen and examined.  He denies chest pain.  Dyspnea much improved.  Continue Demadex 20 mg twice daily.  Follow renal function.  BUN and creatinine pending this morning.  Ambulate today.  Can probably discharge tomorrow if stable.  Plan outpatient nuclear  study for risk stratification. Addendum-patient with  hypocalcemia of unclear etiology.  Further evaluation per primary care. Kirk Ruths, MD

## 2018-06-02 NOTE — Plan of Care (Signed)
  Problem: Clinical Measurements: Goal: Respiratory complications will improve Outcome: Progressing   Problem: Activity: Goal: Risk for activity intolerance will decrease Outcome: Progressing   Problem: Safety: Goal: Ability to remain free from injury will improve Outcome: Progressing   

## 2018-06-03 LAB — BASIC METABOLIC PANEL
Anion gap: 8 (ref 5–15)
BUN: 35 mg/dL — ABNORMAL HIGH (ref 8–23)
CO2: 29 mmol/L (ref 22–32)
Calcium: 6.2 mg/dL — CL (ref 8.9–10.3)
Chloride: 102 mmol/L (ref 98–111)
Creatinine, Ser: 2.88 mg/dL — ABNORMAL HIGH (ref 0.61–1.24)
GFR calc Af Amer: 25 mL/min — ABNORMAL LOW (ref >60)
GFR calc non Af Amer: 21 mL/min — ABNORMAL LOW (ref >60)
Glucose, Bld: 216 mg/dL — ABNORMAL HIGH (ref 70–99)
Potassium: 3.9 mmol/L (ref 3.5–5.1)
Sodium: 139 mmol/L (ref 135–145)

## 2018-06-03 LAB — GLUCOSE, CAPILLARY
Glucose-Capillary: 149 mg/dL — ABNORMAL HIGH (ref 70–99)
Glucose-Capillary: 168 mg/dL — ABNORMAL HIGH (ref 70–99)
Glucose-Capillary: 146 mg/dL — ABNORMAL HIGH (ref 70–99)
Glucose-Capillary: 190 mg/dL — ABNORMAL HIGH (ref 70–99)
Glucose-Capillary: 115 mg/dL — ABNORMAL HIGH (ref 70–99)

## 2018-06-03 LAB — ALBUMIN: Albumin: 2.8 g/dL — ABNORMAL LOW (ref 3.5–5.0)

## 2018-06-03 LAB — VITAMIN D 25 HYDROXY (VIT D DEFICIENCY, FRACTURES): Vit D, 25-Hydroxy: 18.4 ng/mL — ABNORMAL LOW (ref 30.0–100.0)

## 2018-06-03 LAB — PARATHYROID HORMONE, INTACT (NO CA): PTH: 81 pg/mL — ABNORMAL HIGH (ref 15–65)

## 2018-06-03 LAB — PHOSPHORUS: Phosphorus: 5.4 mg/dL — ABNORMAL HIGH (ref 2.5–4.6)

## 2018-06-03 MED ORDER — INSULIN ASPART 100 UNIT/ML ~~LOC~~ SOLN
0.0000 [IU] | Freq: Three times a day (TID) | SUBCUTANEOUS | Status: DC
  Administered 2018-06-03: 2 [IU] via SUBCUTANEOUS

## 2018-06-03 MED ORDER — VITAMIN D (ERGOCALCIFEROL) 1.25 MG (50000 UNIT) PO CAPS
50000.0000 [IU] | ORAL_CAPSULE | ORAL | Status: AC
  Administered 2018-06-03: 50000 [IU] via ORAL
  Filled 2018-06-03 (×2): qty 1

## 2018-06-03 MED ORDER — TORSEMIDE 20 MG PO TABS
20.0000 mg | ORAL_TABLET | Freq: Every day | ORAL | Status: DC
  Administered 2018-06-04: 20 mg via ORAL
  Filled 2018-06-03: qty 1

## 2018-06-03 MED ORDER — VITAMIN D (ERGOCALCIFEROL) 1.25 MG (50000 UNIT) PO CAPS: 50000.0000 [IU] | ORAL_CAPSULE | ORAL | Status: DC

## 2018-06-03 NOTE — Progress Notes (Signed)
Physical Therapy Treatment Patient Details Name: Melvin Mcclure MRN: [id number] DOB: 1951-01-20 Today's Date: 06/03/2018    History of Present Illness Pt is a 68 y/o male witha PMH significant for DM, HTN, CKD III, who presents with acute diastolic congestive heart failure and dizziness.     PT Comments    Pt admitted with above diagnosis. Pt currently with functional limitations due to balance and endurance deficits. Pt was able to ambulate without device with occasional LOB with min assist but with RW can ambulate with min guard assist. Desats with activity as below.  Reviewed pts vestibular exercises with pt able to return demonstration.  Sent HEP with exercises below via email to pt.  Will continue acute PT.    Pt will benefit from skilled PT to increase their independence and safety with mobility to allow discharge to the venue listed below.   Access Code: [email or web address]  URL: [email or web address]  Date: 06/03/2018  Prepared by: Irwin Brakeman   Exercises  Seated Gaze Stabilization with Head Rotation - 2 reps - 1 sets - 5x daily - 7x weekly  Seated Gaze Stabilization with Head Nod - 2 reps - 1 sets - 5x daily - 7x weekly  Standing Gaze Stabilization with Head Rotation - 2 reps - 1 sets - 5x daily - 7x weekly  Standing Gaze Stabilization with Head Nod - 2 reps - 1 sets - 5x daily - 7x weekly  Walking Gaze Stabilization Head Rotation - 2 reps - 1 sets - 5x daily - 7x weekly    SATURATION QUALIFICATIONS: (This note is used to comply with regulatory documentation for home oxygen)  Patient Saturations on Room Air at Rest = 90%  Patient Saturations on Room Air while Ambulating = 86%  Patient Saturations on 2 Liters of oxygen while Ambulating = 92%  Please briefly explain why patient needs home oxygen:Pt requiring 2LO2 to keep sats >90% with activity.   Follow Up Recommendations  Outpatient PT;Supervision for mobility/OOB     Equipment Recommendations  Rolling walker with 5"  wheels    Recommendations for Other Services       Precautions / Restrictions Precautions Precautions: Fall Restrictions Weight Bearing Restrictions: No    Mobility  Bed Mobility Overal bed mobility: Independent                Transfers Overall transfer level: Needs assistance Equipment used: None Transfers: Sit to/from Stand Sit to Stand: Supervision         General transfer comment: Supervision for safety. No assist required.   Ambulation/Gait Ambulation/Gait assistance: Min assist;Min guard Gait Distance (Feet): 300 Feet Assistive device: None Gait Pattern/deviations: Step-through pattern;Decreased stride length;Shuffle;Staggering right(Decreased floor clearance) Gait velocity: Decreased Gait velocity interpretation: 1.31 - 2.62 ft/sec, indicative of limited community ambulator General Gait Details: VC's for pursed-lip breathing. Pt unsteady and required occasional min assist to gain/maintain balance. did teach pt compensatory strategies and pt did well using the strategies.  Pt desat on RA to 86%.  Needed 2L to keep sats >90% with activity.  Pt on 1L at rest.     Stairs             Wheelchair Mobility    Modified Rankin (Stroke Patients Only)       Balance Overall balance assessment: Needs assistance Sitting-balance support: Feet supported;No upper extremity supported Sitting balance-Leahy Scale: Fair     Standing balance support: No upper extremity supported;During functional activity Standing balance-Leahy Scale: Poor Standing balance comment: Occasional UE support/external assist  required                            Cognition Arousal/Alertness: Awake/alert Behavior During Therapy: WFL for tasks assessed/performed Overall Cognitive Status: Impaired/Different from baseline Area of Impairment: Safety/judgement;Awareness;Problem solving;Memory                     Memory: Decreased short-term memory   Safety/Judgement:  Decreased awareness of safety;Decreased awareness of deficits Awareness: Emergent Problem Solving: Slow processing        Exercises Other Exercises Other Exercises: x1 exercises in siting    General Comments General comments (skin integrity, edema, etc.): Educated in incentive spirometry use.        Pertinent Vitals/Pain Pain Assessment: No/denies pain    Home Living                      Prior Function            PT Goals (current goals can now be found in the care plan section) Acute Rehab PT Goals Patient Stated Goal: Stop feeling dizzy; voices frustration with his current physical function and states hed like to be more athletic again.  PT Goal Formulation: With patient/family Time For Goal Achievement: 06/15/18 Potential to Achieve Goals: Good Progress towards PT goals: Progressing toward goals    Frequency    Min 3X/week      PT Plan Current plan remains appropriate    Co-evaluation              AM-PAC PT "6 Clicks" Mobility   Outcome Measure  Help needed turning from your back to your side while in a flat bed without using bedrails?: None Help needed moving from lying on your back to sitting on the side of a flat bed without using bedrails?: None Help needed moving to and from a bed to a chair (including a wheelchair)?: None Help needed standing up from a chair using your arms (e.g., wheelchair or bedside chair)?: None Help needed to walk in hospital room?: A Little Help needed climbing 3-5 steps with a railing? : A Little 6 Click Score: 22    End of Session Equipment Utilized During Treatment: Oxygen;Gait belt Activity Tolerance: Patient tolerated treatment well Patient left: with call bell/phone within reach;with family/visitor present;in bed Nurse Communication: Mobility status PT Visit Diagnosis: Unsteadiness on feet (R26.81);Dizziness and giddiness (R42);Muscle weakness (generalized) (M62.81)     Time: 5462-7035 PT Time  Calculation (min) (ACUTE ONLY): 30 min  Charges:  $Gait Training: 8-22 mins $Therapeutic Exercise: 8-22 mins                     [place] Pager:  [phone]  Office:  [place] 06/03/2018, 10:08 AM

## 2018-06-03 NOTE — Progress Notes (Signed)
Progress Note  Patient Name: Melvin Mcclure Date of Encounter: 06/03/2018  Primary Cardiologist: Dr Stanford Breed  Subjective   No CP or dyspnea; C/O dizziness  Inpatient Medications    Scheduled Meds: . amLODipine  10 mg Oral Daily  . aspirin EC  81 mg Oral Daily  . calcium carbonate  1 tablet Oral BID WC  . gabapentin  100 mg Oral QHS  . heparin  5,000 Units Subcutaneous Q8H  . hydrALAZINE  25 mg Oral Q8H  . insulin glargine  5 Units Subcutaneous Daily  . torsemide  20 mg Oral BID  . Vitamin D (Ergocalciferol)  50,000 Units Oral Q7 days   Continuous Infusions:  PRN Meds: acetaminophen **OR** acetaminophen, ondansetron **OR** ondansetron (ZOFRAN) IV   Vital Signs    Vitals:   06/02/18 2105 06/03/18 0341 06/03/18 0507 06/03/18 0849  BP: (!) 155/89 (!) 145/73 138/81 (!) 150/86  Pulse: 83 77 75   Resp:      Temp:   98.3 F (36.8 C)   TempSrc:   Oral   SpO2:   96%   Weight:  93.1 kg    Height:        Intake/Output Summary (Last 24 hours) at 06/03/2018 1036 Last data filed at 06/03/2018 0347 Gross per 24 hour  Intake 480 ml  Output 1200 ml  Net -720 ml   Last 3 Weights 06/03/2018 06/02/2018 06/01/2018  Weight (lbs) 205 lb 3.2 oz 205 lb 11.2 oz 203 lb 12.8 oz  Weight (kg) 93.078 kg 93.305 kg 92.443 kg      Telemetry    Sinus- Personally Reviewed   Physical Exam   GEN: WD/WN NAD Neck: No JVD Cardiac: RRR, no murmur Respiratory: CTA; no wheeze GI: Soft, NT/ND, no masses MS: No edema Neuro:  Grossly intact  Labs    Chemistry Recent Labs  Lab 05/28/18 1531  06/01/18 0411 06/02/18 0745 06/03/18 0502  NA  --    < > 139 139 139  K  --    < > 4.0 4.1 3.9  CL  --    < > 103 100 102  CO2  --    < > 27 28 29   GLUCOSE  --    < > 146* 146* 216*  BUN  --    < > 27* 32* 35*  CREATININE  --    < > 2.73* 2.72* 2.88*  CALCIUM  --    < > 6.5* 6.3* 6.2*  PROT 8.4*  --   --   --   --   ALBUMIN 3.5  --   --   --  2.8*  AST 23  --   --   --   --   ALT 16  --    --   --   --   ALKPHOS 86  --   --   --   --   BILITOT 1.0  --   --   --   --   GFRNONAA  --    < > 23* 23* 21*  GFRAA  --    < > 26* 27* 25*  ANIONGAP  --    < > 9 11 8    < > = values in this interval not displayed.     Hematology Recent Labs  Lab 05/29/18 0439 05/30/18 0533 06/01/18 0930  WBC 9.0 8.6 9.3  RBC 3.82* 4.06* 4.38  HGB 10.0* 10.2* 11.3*  HCT 32.1* 34.0* 36.1*  MCV 84.0 83.7  82.4  MCH 26.2 25.1* 25.8*  MCHC 31.2 30.0 31.3  RDW 16.9* 16.6* 16.0*  PLT 238 240 315    Cardiac Enzymes Recent Labs  Lab 05/28/18 1832  TROPONINI <0.03    Recent Labs  Lab 05/28/18 1425  TROPIPOC 0.01     BNP Recent Labs  Lab 05/28/18 1525  BNP 534.6*     Patient Profile     68 year old male with past medical history of diabetes mellitus, hypertension, chronic stage III kidney disease with acute diastolic congestive heart failure.  Echocardiogram shows normal LV function and severe left ventricular hypertrophy.    Assessment & Plan    1 acute diastolic congestive heart failure-I/O - 480. Wt 205.2.  Dyspnea has improved.  He does not appear to be volume overloaded on examination and creatinine is mildly increased.  Decrease Demadex to 20 mg daily and follow renal function. Needs fluid restriction to 1 L daily and low-sodium diet.  Echocardiogram shows preserved LV function and severe left ventricular hypertrophy.    2 acute on chronic stage III kidney disease-creatinine mildly increased.  Decrease Demadex to 20 mg daily and follow.  3 hypertension-blood pressure remains mildly elevated.  Hydralazine recently added.  Continue amlodipine.  Follow blood pressure and advance regimen as needed.  4 abnormal electrocardiogram-possible inferior infarct noted.  Patient also with multiple risk factors including 25 years of diabetes mellitus.  Will need nuclear study as an outpatient.  No wall motion is normal on echocardiogram.  He would be at risk for contrast nephropathy if  catheterization required.  5  hypocalcemia-further work-up and management per primary care.  Possible discharge tomorrow if stable.  For questions or updates, please contact [place] Please consult [email or web address] for contact info under        Signed, Kirk Ruths, MD  06/03/2018, 10:36 AM

## 2018-06-03 NOTE — Plan of Care (Signed)
  Problem: Education: Goal: Knowledge of General Education information will improve Description Including pain rating scale, medication(s)/side effects and non-pharmacologic comfort measures Outcome: Progressing   

## 2018-06-03 NOTE — Progress Notes (Signed)
PROGRESS NOTE    Melvin Mcclure  ZGY:[id number] DOB: 18-Jul-1950 DOA: 05/28/2018 PCP: Nolene Ebbs, MD     Brief Narrative:  Melvin Mcclure is a 68 y.o.malewith medical history significant oftype 2 diabetes mellitus on insulin, stage III-IV chronic kidney disease, neuropathy was in his usual state of health until approximately 1 week ago. Patient was seen in the emergency roomfor shortness of breathand diagnosed with pneumonia given question infiltrate noted on x-ray then discharged home on antibiotics but reported no improvement, progressively had worsening shortness of breath, orthopnea, dizziness and weakness, repeat x-ray  in the ED was more suggestive of fluid overload/CHF BNP 500 range, creatinine is 2.2. He was admitted with new onset diastolic CHF started on diuretics. Cardiology consulted.   New events last 24 hours / Subjective: Patient remains on nasal cannula O2 1 L this morning.  Assessment & Plan:   Active Problems:   Acute CHF (congestive heart failure) (HCC)   Type 2 diabetes mellitus with stage 3 chronic kidney disease (HCC)   CKD stage 4 due to type 2 diabetes mellitus (HCC)   CHF (congestive heart failure) ([place])   New onset acute diastolic CHF -2D echocardiogram noted preserved EF, severe LVH and normal wall motion -Improving with diuresis, he is net -7.1L -Appreciate cardiology input -Transitioned to oral torsemide -Strict I's and O's, daily weight  Acute hypoxemic respiratory failure -Continue to wean O2 as able, may need home O2 on discharge  Abnormal EKG -Q waves in inferior leads, RBBB -Appreciate cards input, stress test recommended after discharge  Type 2 diabetes mellitus -Continue Lantus  Chronic kidney disease stage IV -Patient reports baseline creatinine in the 2.0 range -Was taking PRN NSAIDs at home and also expect a component of cardiorenal syndrome -Followed by Dr. Royce Macadamia with [facility]  Essential  hypertension -Continue Norvasc, hydralazine  Recent history of vertigo -Associated with hearing loss and ringing in his right ear -Symptoms concerning for Mnire's disease -Recommended outpatient follow-up with ENT -Physical therapy evaluation completed  Hypocalcemia -Corrected calcium for hypoalbuminemia 7.16 -PTH elevated at 81, vitamin D low at 18.4, phosphorus elevated at 5.4 -Etiology for patient's hypocalcemia multifactorial likely secondary to hyperphosphatemia due to chronic renal failure, vitamin D deficiency, loop diuretic, hypoalbuminemia -Continue supplementation with p.o. calcium carbonate, vitamin D   DVT prophylaxis: Subcutaneous heparin Code Status: Full code Family Communication: Wife at bedside Disposition Plan: Discharge home 1/31 if remains stable, cardiology recommendation   Consultants:   Cardiology  Procedures:   None  Antimicrobials:  Anti-infectives (From admission, onward)   None        Objective: Vitals:   06/03/18 0341 06/03/18 0507 06/03/18 0849 06/03/18 1113  BP: (!) 145/73 138/81 (!) 150/86 136/81  Pulse: 77 75  72  Resp:      Temp:  98.3 F (36.8 C)  (!) 97.5 F (36.4 C)  TempSrc:  Oral  Oral  SpO2:  96%  95%  Weight: 93.1 kg     Height:        Intake/Output Summary (Last 24 hours) at 06/03/2018 1239 Last data filed at 06/03/2018 1115 Gross per 24 hour  Intake 480 ml  Output 1200 ml  Net -720 ml   Filed Weights   06/01/18 0526 06/02/18 0533 06/03/18 0341  Weight: 92.4 kg 93.3 kg 93.1 kg    Examination:  General exam: Appears calm and comfortable  Respiratory system: Clear to auscultation. Respiratory effort normal. Cardiovascular system: S1 & S2 heard, RRR. No JVD, murmurs, rubs, gallops  or clicks. No pedal edema. Gastrointestinal system: Abdomen is nondistended, soft and nontender. No organomegaly or masses felt. Normal bowel sounds heard. Central nervous system: Alert and oriented. No focal neurological  deficits. Extremities: Symmetric 5 x 5 power. Skin: No rashes, lesions or ulcers Psychiatry: Judgement and insight appear normal. Mood & affect appropriate.   Data Reviewed: I have personally reviewed following labs and imaging studies  CBC: Recent Labs  Lab 05/28/18 1411 05/29/18 0439 05/30/18 0533 06/01/18 0930  WBC 10.4 9.0 8.6 9.3  HGB 10.8* 10.0* 10.2* 11.3*  HCT 36.2* 32.1* 34.0* 36.1*  MCV 84.4 84.0 83.7 82.4  PLT 256 238 240 619   Basic Metabolic Panel: Recent Labs  Lab 05/28/18 1525  05/30/18 0533 05/31/18 0447 06/01/18 0411 06/02/18 0745 06/03/18 0502  NA  --    < > 142 141 139 139 139  K  --    < > 4.0 3.9 4.0 4.1 3.9  CL  --    < > 109 108 103 100 102  CO2  --    < > 24 26 27 28 29   GLUCOSE  --    < > 136* 126* 146* 146* 216*  BUN  --    < > 24* 25* 27* 32* 35*  CREATININE  --    < > 2.56* 2.53* 2.73* 2.72* 2.88*  CALCIUM  --    < > 6.8* 6.5* 6.5* 6.3* 6.2*  MG 2.3  --   --   --   --   --   --   PHOS  --   --   --   --   --   --  5.4*   < > = values in this interval not displayed.   GFR: Estimated Creatinine Clearance: 28.1 mL/min (A) (by C-G formula based on SCr of 2.88 mg/dL (H)). Liver Function Tests: Recent Labs  Lab 05/28/18 1531 06/03/18 0502  AST 23  --   ALT 16  --   ALKPHOS 86  --   BILITOT 1.0  --   PROT 8.4*  --   ALBUMIN 3.5 2.8*   No results for input(s): LIPASE, AMYLASE in the last 168 hours. No results for input(s): AMMONIA in the last 168 hours. Coagulation Profile: No results for input(s): INR, PROTIME in the last 168 hours. Cardiac Enzymes: Recent Labs  Lab 05/28/18 1832  TROPONINI <0.03   BNP (last 3 results) No results for input(s): PROBNP in the last 8760 hours. HbA1C: No results for input(s): HGBA1C in the last 72 hours. CBG: Recent Labs  Lab 06/02/18 1620 06/02/18 2109 06/03/18 0333 06/03/18 0737 06/03/18 1112  GLUCAP 147* 160* 149* 168* 146*   Lipid Profile: No results for input(s): CHOL, HDL, LDLCALC,  TRIG, CHOLHDL, LDLDIRECT in the last 72 hours. Thyroid Function Tests: No results for input(s): TSH, T4TOTAL, FREET4, T3FREE, THYROIDAB in the last 72 hours. Anemia Panel: No results for input(s): VITAMINB12, FOLATE, FERRITIN, TIBC, IRON, RETICCTPCT in the last 72 hours. Sepsis Labs: No results for input(s): PROCALCITON, LATICACIDVEN in the last 168 hours.  No results found for this or any previous visit (from the past 240 hour(s)).     Radiology Studies: No results found.    Scheduled Meds: . amLODipine  10 mg Oral Daily  . aspirin EC  81 mg Oral Daily  . calcium carbonate  1 tablet Oral BID WC  . gabapentin  100 mg Oral QHS  . heparin  5,000 Units Subcutaneous Q8H  . hydrALAZINE  25 mg Oral Q8H  . insulin glargine  5 Units Subcutaneous Daily  . [START ON 06/04/2018] torsemide  20 mg Oral Daily   Continuous Infusions:   LOS: 6 days    Time spent: 35 minutes   Dessa Phi, DO [facility] [email or web address] 06/03/2018, 12:39 PM

## 2018-06-03 NOTE — Progress Notes (Signed)
SATURATION QUALIFICATIONS: (This note is used to comply with regulatory documentation for home oxygen)  Patient Saturations on Room Air at Rest = 90%  Patient Saturations on Room Air while Ambulating = 86%  Patient Saturations on 2 Liters of oxygen while Ambulating = 92%  Please briefly explain why patient needs home oxygen:Pt requiring 2LO2 to keep sats >90% with activity.  Thanks.  [place] Pager:  [phone]  Office:  [phone]

## 2018-06-03 NOTE — Progress Notes (Signed)
Pt. With critical Calcium of 6.2 this am. MD paged to make aware.

## 2018-06-04 ENCOUNTER — Ambulatory Visit: Payer: Medicare Other | Attending: Internal Medicine | Admitting: Physical Therapy

## 2018-06-04 LAB — BASIC METABOLIC PANEL
Anion gap: 10 (ref 5–15)
BUN: 38 mg/dL — ABNORMAL HIGH (ref 8–23)
CO2: 28 mmol/L (ref 22–32)
Calcium: 6.8 mg/dL — ABNORMAL LOW (ref 8.9–10.3)
Chloride: 102 mmol/L (ref 98–111)
Creatinine, Ser: 2.53 mg/dL — ABNORMAL HIGH (ref 0.61–1.24)
GFR calc Af Amer: 29 mL/min — ABNORMAL LOW (ref >60)
GFR calc non Af Amer: 25 mL/min — ABNORMAL LOW (ref >60)
Glucose, Bld: 112 mg/dL — ABNORMAL HIGH (ref 70–99)
Potassium: 4.1 mmol/L (ref 3.5–5.1)
Sodium: 140 mmol/L (ref 135–145)

## 2018-06-04 LAB — GLUCOSE, CAPILLARY: Glucose-Capillary: 101 mg/dL — ABNORMAL HIGH (ref 70–99)

## 2018-06-04 MED ORDER — CALCIUM CARBONATE 1250 (500 CA) MG PO TABS: 1.0000 | ORAL_TABLET | Freq: Two times a day (BID) | ORAL | 0 refills | Status: DC

## 2018-06-04 MED ORDER — TORSEMIDE 20 MG PO TABS: 20.0000 mg | ORAL_TABLET | Freq: Every day | ORAL | 0 refills | Status: DC

## 2018-06-04 MED ORDER — HYDRALAZINE HCL 50 MG PO TABS: 50.0000 mg | ORAL_TABLET | Freq: Three times a day (TID) | ORAL | Status: DC

## 2018-06-04 MED ORDER — GABAPENTIN 100 MG PO CAPS: 100.0000 mg | ORAL_CAPSULE | Freq: Every day | ORAL | 0 refills | Status: DC

## 2018-06-04 MED ORDER — INSULIN GLARGINE 100 UNIT/ML ~~LOC~~ SOLN: 5.0000 [IU] | Freq: Every day | SUBCUTANEOUS | 0 refills | Status: DC

## 2018-06-04 MED ORDER — VITAMIN D (ERGOCALCIFEROL) 1.25 MG (50000 UNIT) PO CAPS: 50000.0000 [IU] | ORAL_CAPSULE | ORAL | 0 refills | Status: DC

## 2018-06-04 MED ORDER — HYDRALAZINE HCL 50 MG PO TABS: 50.0000 mg | ORAL_TABLET | Freq: Three times a day (TID) | ORAL | 0 refills | Status: DC

## 2018-06-04 NOTE — Progress Notes (Addendum)
Progress Note  Patient Name: Melvin Mcclure Date of Encounter: 06/04/2018  Primary Cardiologist: Dr Stanford Breed  Subjective   Pt denies CP or dyspnea; some fatigue  Inpatient Medications    Scheduled Meds: . amLODipine  10 mg Oral Daily  . aspirin EC  81 mg Oral Daily  . calcium carbonate  1 tablet Oral BID WC  . gabapentin  100 mg Oral QHS  . heparin  5,000 Units Subcutaneous Q8H  . hydrALAZINE  25 mg Oral Q8H  . insulin aspart  0-9 Units Subcutaneous TID WC  . insulin glargine  5 Units Subcutaneous Daily  . torsemide  20 mg Oral Daily   Continuous Infusions:  PRN Meds: acetaminophen **OR** acetaminophen, ondansetron **OR** ondansetron (ZOFRAN) IV   Vital Signs    Vitals:   06/03/18 1113 06/03/18 1512 06/03/18 1952 06/04/18 0438  BP: 136/81 (!) 142/81 (!) 154/89 (!) 159/77  Pulse: 72  76 84  Resp:   18 18  Temp: (!) 97.5 F (36.4 C)  98.1 F (36.7 C) 97.8 F (36.6 C)  TempSrc: Oral  Oral Oral  SpO2: 95%  96% 98%  Weight:    93.2 kg  Height:        Intake/Output Summary (Last 24 hours) at 06/04/2018 0835 Last data filed at 06/04/2018 0440 Gross per 24 hour  Intake 600 ml  Output 1725 ml  Net -1125 ml   Last 3 Weights 06/04/2018 06/03/2018 06/02/2018  Weight (lbs) 205 lb 8 oz 205 lb 3.2 oz 205 lb 11.2 oz  Weight (kg) 93.214 kg 93.078 kg 93.305 kg      Telemetry    Sinus- Personally Reviewed   Physical Exam   GEN: NAD Neck: Supple Cardiac: RRR Respiratory: mildly diminished BS bases GI: NT/ND no rebound MS: No edema Neuro:  No focal findings  Labs    Chemistry Recent Labs  Lab 05/28/18 1531  06/02/18 0745 06/03/18 0502 06/04/18 0551  NA  --    < > 139 139 140  K  --    < > 4.1 3.9 4.1  CL  --    < > 100 102 102  CO2  --    < > 28 29 28   GLUCOSE  --    < > 146* 216* 112*  BUN  --    < > 32* 35* 38*  CREATININE  --    < > 2.72* 2.88* 2.53*  CALCIUM  --    < > 6.3* 6.2* 6.8*  PROT 8.4*  --   --   --   --   ALBUMIN 3.5  --   --  2.8*  --    AST 23  --   --   --   --   ALT 16  --   --   --   --   ALKPHOS 86  --   --   --   --   BILITOT 1.0  --   --   --   --   GFRNONAA  --    < > 23* 21* 25*  GFRAA  --    < > 27* 25* 29*  ANIONGAP  --    < > 11 8 10    < > = values in this interval not displayed.     Hematology Recent Labs  Lab 05/29/18 0439 05/30/18 0533 06/01/18 0930  WBC 9.0 8.6 9.3  RBC 3.82* 4.06* 4.38  HGB 10.0* 10.2* 11.3*  HCT 32.1* 34.0* 36.1*  MCV 84.0 83.7 82.4  MCH 26.2 25.1* 25.8*  MCHC 31.2 30.0 31.3  RDW 16.9* 16.6* 16.0*  PLT 238 240 315    Cardiac Enzymes Recent Labs  Lab 05/28/18 1832  TROPONINI <0.03    Recent Labs  Lab 05/28/18 1425  TROPIPOC 0.01     BNP Recent Labs  Lab 05/28/18 1525  BNP 534.6*     Patient Profile     68 year old male with past medical history of diabetes mellitus, hypertension, chronic stage III kidney disease with acute diastolic congestive heart failure.  Echocardiogram shows normal LV function and severe left ventricular hypertrophy.    Assessment & Plan    1 acute diastolic congestive heart failure-I/O - 1125. Wt 205.5.  Patient appears to be euvolemic on examination.  Renal function slightly improved today.  Continue Demadex 20 mg daily.  I have asked him to weigh daily and take an additional 20 mg for weight gain of 2 to 3 pounds.  We discussed fluid restriction to 1-1.5 liters daily.  We also discussed low-sodium diet. Echocardiogram shows preserved LV function and severe left ventricular hypertrophy.  May need home oxygen but will leave to primary care.  We will also consider PYP scan to screen for amyloid following discharge given severe left ventricular hypertrophy on echocardiogram.  2 acute on chronic stage III kidney disease-renal function slightly improved today.  Continue Demadex 20 mg daily.  He will need close follow-up with nephrology following discharge.  Would also check potassium and renal function 1 week after discharge.  3  hypertension-blood pressure remains mildly elevated.  Continue present dose of amlodipine.  Increase hydralazine to 50 mg p.o. 3 times daily.  Will titrate medications as an outpatient after discharge.  4 abnormal electrocardiogram-possible inferior infarct noted.  Patient also with multiple risk factors including 25 years of diabetes mellitus.  Will need nuclear study as an outpatient.  No wall motion is normal on echocardiogram.  He would be at risk for contrast nephropathy if catheterization required.  5  hypocalcemia-management per primary care.  Patient can be discharged from a cardiac standpoint.  [facility] will sign off.   Medication Recommendations: Would continue present medications and MAR at discharge. Other recommendations (labs, testing, etc): We will arrange outpatient nuclear study following discharge as his strength improves.  We will also arrange PYP scan to exclude amyloid at time of follow-up. Follow up as an outpatient: Follow-up APP 1 week with bmet at that time.  Follow-up with me 3 months.  For questions or updates, please contact [place] Please consult [email or web address] for contact info under        Signed, Kirk Ruths, MD  06/04/2018, 8:35 AM

## 2018-06-04 NOTE — Care Management Important Message (Signed)
Important Message  Patient Details  Name: Melvin Mcclure MRN: [id number] Date of Birth: Oct 26, 1950   Medicare Important Message Given:  Yes    Providencia Hottenstein P Blakely Gluth 06/04/2018, 2:54 PM

## 2018-06-04 NOTE — Discharge Summary (Signed)
Physician Discharge Summary  Melvin Mcclure NOM:[id number] DOB: [birth year] DOA: 05/28/2018  PCP: Nolene Ebbs, MD  Admit date: 05/28/2018 Discharge date: 06/04/2018  Admitted From: Home Disposition:  Home  Recommendations for Outpatient Follow-up:  1. Follow up with PCP in 1 week 2. Follow up with Cardiology 1 week and Dr. Stanford Breed in 3 months 3. Follow up with Dr. Royce Macadamia with [facility] in 1-2 weeks  4. Please obtain BMP in 1 week   Equipment/Devices: O2   Discharge Condition: Home CODE STATUS: Full  Diet recommendation: Heart healthy, sodium and fluid restriction 1-1.5L daily  Brief/Interim Summary: Melvin Mcclure is a 68 y.o.malewith medical history significant oftype 2 diabetes mellitus on insulin, stage III-IV chronic kidney disease, neuropathy was in his usual state of health until approximately 1 week ago. Patient was seen in the emergency roomfor shortness of breathand diagnosed with pneumonia given question infiltrate noted on x-ray then discharged home on antibiotics but reported no improvement, progressively had worsening shortness of breath, orthopnea, dizziness and weakness, repeat x-ray in the ED was more suggestive of fluid overload/CHF BNP 500 range, creatinine is 2.2. He was admitted with new onsetdiastolicCHF started on diuretics. Cardiology consulted.   Discharge Diagnoses:  New onset acute diastolic CHF -2D echocardiogram noted preserved EF, severe LVH and normal wall motion -Improving with diuresis,he is net -7.6L -Appreciate cardiology input -Transitioned to oral torsemide -Strict I's and O's, daily weight -Continue fluid restriction diet   Acute hypoxemic respiratory failure -Continue to wean O2 as able, qualifies for home O2 will order on discharge   Abnormal EKG -Q waves in inferior leads, RBBB -Appreciate cards input, stress test recommended after discharge  Type 2 diabetes mellitus -Continue Lantus  Chronic kidney  disease stage IV -Patient reports baseline creatinine in the 2.0 range -Was taking PRN NSAIDs at home and also expect a component of cardiorenal syndrome -Followed by Dr. Royce Macadamia with [facility]  Essential hypertension -Continue Norvasc, hydralazine  Recent history of vertigo -Associated with hearing loss and ringing in his right ear -Symptoms concerning for Mnire's disease -Recommended outpatient follow-up with ENT -Physical therapy evaluation completed  Hypocalcemia -Corrected calcium for hypoalbuminemia 7.16 -PTH elevated at 81, vitamin D low at 18.4, phosphorus elevated at 5.4 -Etiology for patient's hypocalcemia multifactorial likely secondary to hyperphosphatemia due to chronic renal failure, vitamin D deficiency, loop diuretic, hypoalbuminemia -Continue supplementation with p.o. calcium carbonate, vitamin D  Discharge Instructions  Discharge Instructions    (HEART FAILURE PATIENTS) Call MD:  Anytime you have any of the following symptoms: 1) 3 pound weight gain in 24 hours or 5 pounds in 1 week 2) shortness of breath, with or without a dry hacking cough 3) swelling in the hands, feet or stomach 4) if you have to sleep on extra pillows at night in order to breathe.   Complete by:  As directed    Call MD for:  difficulty breathing, headache or visual disturbances   Complete by:  As directed    Call MD for:  extreme fatigue   Complete by:  As directed    Call MD for:  hives   Complete by:  As directed    Call MD for:  persistant dizziness or light-headedness   Complete by:  As directed    Call MD for:  persistant nausea and vomiting   Complete by:  As directed    Call MD for:  severe uncontrolled pain   Complete by:  As directed    Call MD for:  temperature >100.4   Complete by:  As directed    Diet - low sodium heart healthy   Complete by:  As directed    Discharge instructions   Complete by:  As directed    Continue Demadex 20 mg daily.  Weigh  yourself daily and take an additional 20 mg for weight gain of 2 to 3 pounds.   You were cared for by a hospitalist during your hospital stay. If you have any questions about your discharge medications or the care you received while you were in the hospital after you are discharged, you can call the unit and ask to speak with the hospitalist on call if the hospitalist that took care of you is not available. Once you are discharged, your primary care physician will handle any further medical issues. Please note that NO REFILLS for any discharge medications will be authorized once you are discharged, as it is imperative that you return to your primary care physician (or establish a relationship with a primary care physician if you do not have one) for your aftercare needs so that they can reassess your need for medications and monitor your lab values.   Increase activity slowly   Complete by:  As directed      Allergies as of 06/04/2018   No Known Allergies     Medication List    STOP taking these medications   cefdinir 300 MG capsule Commonly known as:  OMNICEF   cyclobenzaprine 10 MG tablet Commonly known as:  FLEXERIL   HYDROcodone-acetaminophen 5-325 MG tablet Commonly known as:  NORCO   losartan 100 MG tablet Commonly known as:  COZAAR   meloxicam 15 MG tablet Commonly known as:  MOBIC   ondansetron 4 MG disintegrating tablet Commonly known as:  ZOFRAN-ODT   traMADol 50 MG tablet Commonly known as:  ULTRAM     TAKE these medications   amLODipine 10 MG tablet Commonly known as:  NORVASC Take 10 mg by mouth daily.   aspirin 81 MG tablet Take 81 mg by mouth daily.   calcium carbonate 1250 (500 Ca) MG tablet Commonly known as:  OS-CAL - dosed in mg of elemental calcium Take 1 tablet (500 mg of elemental calcium total) by mouth 2 (two) times daily with a meal.   gabapentin 100 MG capsule Commonly known as:  NEURONTIN Take 1 capsule (100 mg total) by mouth at  bedtime. What changed:    medication strength  how much to take  when to take this   hydrALAZINE 50 MG tablet Commonly known as:  APRESOLINE Take 1 tablet (50 mg total) by mouth every 8 (eight) hours.   insulin aspart 100 UNIT/ML injection Commonly known as:  novoLOG Inject 4-8 Units into the skin 3 (three) times daily before meals. Per sliding scale   insulin glargine 100 UNIT/ML injection Commonly known as:  LANTUS Inject 0.05 mLs (5 Units total) into the skin daily. What changed:  how much to take   torsemide 20 MG tablet Commonly known as:  DEMADEX Take 1 tablet (20 mg total) by mouth daily. Start taking on:  June 05, 2018   Vitamin D (Ergocalciferol) 1.25 MG (50000 UT) Caps capsule Commonly known as:  DRISDOL Take 1 capsule (50,000 Units total) by mouth every 7 (seven) days. Start taking on:  June 10, 2018            Durable Medical Equipment  (From admission, onward)         Start  Ordered   06/04/18 0937  DME Oxygen  Once    Question Answer Comment  Mode or (Route) Nasal cannula   Liters per Minute 2   Frequency Continuous (stationary and portable oxygen unit needed)   Oxygen delivery system Gas      06/04/18 0937         Follow-up Information    Nolene Ebbs, MD. Schedule an appointment as soon as possible for a visit in 1 week(s).   Specialty:  Internal Medicine Contact information: [place] [address] [phone]        Lelon Perla, MD. Schedule an appointment as soon as possible for a visit in 1 week(s).   Specialty:  Cardiology Why:  Follow up with APP in 1 week and with Dr. Stanford Breed in 3 months  Contact information: [place] [address] [phone]        [place], [place]. Schedule an appointment as soon as possible for a visit in 1 week(s).   Contact information: [place] [address] [phone]          No Known  Allergies  Consultations:  Cardiology    Procedures/Studies: Dg Chest 2 View  Result Date: 06/01/2018 CLINICAL DATA:  CHF EXAM: CHEST - 2 VIEW COMPARISON:  05/28/2018 FINDINGS: Bilateral mild interstitial thickening. Trace right pleural effusion. No pneumothorax. Stable cardiomediastinal silhouette. No aggressive osseous lesion. IMPRESSION: Mild CHF. Electronically Signed   By: Kathreen Devoid   On: 06/01/2018 11:24   Dg Chest Portable 1 View  Result Date: 05/28/2018 CLINICAL DATA:  Dyspnea.  Assess for congestive heart failure. EXAM: PORTABLE CHEST 1 VIEW COMPARISON:  May 20, 2018 FINDINGS: The heart size and mediastinal contours are stable. The heart size is enlarged. Right pleural effusion is noted. Consolidation of the right mid and lung base are noted. There is pulmonary edema. The visualized skeletal structures are stable. IMPRESSION: Congestive heart failure. Right pleural effusion. Patchy consolidation of right lung base at least in part due to atelectasis but superimposed pneumonia is not excluded. Electronically Signed   By: Abelardo Diesel M.D.   On: 05/28/2018 15:50    Echo Study Conclusions  - Procedure narrative: Transthoracic echocardiography. Image   quality was suboptimal. The study was technically difficult, as a   result of poor sound wave transmission. Intravenous contrast   (Definity) was administered. - Left ventricle: The cavity size was normal. Wall thickness was   increased in a pattern of severe LVH. Systolic function was   normal. The estimated ejection fraction was in the range of 60%   to 65%. Wall motion was normal; there were no regional wall   motion abnormalities. Left ventricular diastolic function   parameters were normal. - Atrial septum: No defect or patent foramen ovale was identified.    Discharge Exam: Vitals:   06/04/18 0438 06/04/18 0849  BP: (!) 159/77 (!) 145/85  Pulse: 84 79  Resp: 18   Temp: 97.8 F (36.6 C)   SpO2: 98% 100%     General: Pt is alert, awake, not in acute distress Cardiovascular: RRR, S1/S2 +, no rubs, no gallops Respiratory: CTA bilaterally, no wheezing, no rhonchi Abdominal: Soft, NT, ND, bowel sounds + Extremities: no edema, no cyanosis    The results of significant diagnostics from this hospitalization (including imaging, microbiology, ancillary and laboratory) are listed below for reference.     Microbiology: No results found for this or any previous visit (from the past 240  hour(s)).   Labs: BNP (last 3 results) Recent Labs    05/20/18 1457 05/28/18 1525  BNP 460.2* 366.4*   Basic Metabolic Panel: Recent Labs  Lab 05/28/18 1525  05/31/18 0447 06/01/18 0411 06/02/18 0745 06/03/18 0502 06/04/18 0551  NA  --    < > 141 139 139 139 140  K  --    < > 3.9 4.0 4.1 3.9 4.1  CL  --    < > 108 103 100 102 102  CO2  --    < > 26 27 28 29 28   GLUCOSE  --    < > 126* 146* 146* 216* 112*  BUN  --    < > 25* 27* 32* 35* 38*  CREATININE  --    < > 2.53* 2.73* 2.72* 2.88* 2.53*  CALCIUM  --    < > 6.5* 6.5* 6.3* 6.2* 6.8*  MG 2.3  --   --   --   --   --   --   PHOS  --   --   --   --   --  5.4*  --    < > = values in this interval not displayed.   Liver Function Tests: Recent Labs  Lab 05/28/18 1531 06/03/18 0502  AST 23  --   ALT 16  --   ALKPHOS 86  --   BILITOT 1.0  --   PROT 8.4*  --   ALBUMIN 3.5 2.8*   No results for input(s): LIPASE, AMYLASE in the last 168 hours. No results for input(s): AMMONIA in the last 168 hours. CBC: Recent Labs  Lab 05/28/18 1411 05/29/18 0439 05/30/18 0533 06/01/18 0930  WBC 10.4 9.0 8.6 9.3  HGB 10.8* 10.0* 10.2* 11.3*  HCT 36.2* 32.1* 34.0* 36.1*  MCV 84.4 84.0 83.7 82.4  PLT 256 238 240 315   Cardiac Enzymes: Recent Labs  Lab 05/28/18 1832  TROPONINI <0.03   BNP: Invalid input(s): POCBNP CBG: Recent Labs  Lab 06/03/18 0737 06/03/18 1112 06/03/18 1631 06/03/18 2104 06/04/18 0735  GLUCAP 168* 146* 190* 115* 101*    D-Dimer No results for input(s): DDIMER in the last 72 hours. Hgb A1c No results for input(s): HGBA1C in the last 72 hours. Lipid Profile No results for input(s): CHOL, HDL, LDLCALC, TRIG, CHOLHDL, LDLDIRECT in the last 72 hours. Thyroid function studies No results for input(s): TSH, T4TOTAL, T3FREE, THYROIDAB in the last 72 hours.  Invalid input(s): FREET3 Anemia work up No results for input(s): VITAMINB12, FOLATE, FERRITIN, TIBC, IRON, RETICCTPCT in the last 72 hours. Urinalysis    Component Value Date/Time   COLORURINE YELLOW 03/07/2017 1413   APPEARANCEUR CLEAR 03/07/2017 1413   LABSPEC 1.015 03/07/2017 1413   PHURINE 5.0 03/07/2017 1413   GLUCOSEU >=500 (A) 03/07/2017 1413   HGBUR MODERATE (A) 03/07/2017 1413   BILIRUBINUR NEGATIVE 03/07/2017 1413   KETONESUR 5 (A) 03/07/2017 1413   PROTEINUR 100 (A) 03/07/2017 1413   UROBILINOGEN 0.2 06/17/2010 1620   NITRITE NEGATIVE 03/07/2017 1413   LEUKOCYTESUR NEGATIVE 03/07/2017 1413   Sepsis Labs Invalid input(s): PROCALCITONIN,  WBC,  LACTICIDVEN Microbiology No results found for this or any previous visit (from the past 240 hour(s)).   Patient was seen and examined on the day of discharge and was found to be in stable condition. Time coordinating discharge: 25 minutes including assessment and coordination of care, as well as examination of the patient.   SIGNED:  Dessa Phi, DO [facility] [email or web address]  06/04/2018, 9:41 AM

## 2018-06-04 NOTE — Progress Notes (Addendum)
SATURATION QUALIFICATIONS: (This note is used to comply with regulatory documentation for home oxygen)  Patient Saturations on Room Air at Rest = 97 %  Patient Saturations on Room Air while Ambulating = 96%   Patient tolerated well - no dizziness, SOB, etc.

## 2018-06-10 DIAGNOSIS — H8113 Benign paroxysmal vertigo, bilateral: Secondary | ICD-10-CM | POA: Diagnosis not present

## 2018-06-10 DIAGNOSIS — E1165 Type 2 diabetes mellitus with hyperglycemia: Secondary | ICD-10-CM | POA: Diagnosis not present

## 2018-06-10 DIAGNOSIS — N183 Chronic kidney disease, stage 3 (moderate): Secondary | ICD-10-CM | POA: Diagnosis not present

## 2018-06-10 DIAGNOSIS — E0842 Diabetes mellitus due to underlying condition with diabetic polyneuropathy: Secondary | ICD-10-CM | POA: Diagnosis not present

## 2018-06-10 DIAGNOSIS — I10 Essential (primary) hypertension: Secondary | ICD-10-CM | POA: Diagnosis not present

## 2018-06-10 DIAGNOSIS — I5032 Chronic diastolic (congestive) heart failure: Secondary | ICD-10-CM | POA: Diagnosis not present

## 2018-06-15 ENCOUNTER — Ambulatory Visit (INDEPENDENT_AMBULATORY_CARE_PROVIDER_SITE_OTHER): Payer: Medicare Other | Admitting: Cardiology

## 2018-06-15 ENCOUNTER — Other Ambulatory Visit: Payer: Self-pay

## 2018-06-15 ENCOUNTER — Encounter: Payer: Self-pay | Admitting: Cardiology

## 2018-06-15 VITALS — BP 148/78 | HR 86 | Ht 70.0 in | Wt 204.0 lb

## 2018-06-15 DIAGNOSIS — N183 Chronic kidney disease, stage 3 unspecified: Secondary | ICD-10-CM

## 2018-06-15 DIAGNOSIS — I43 Cardiomyopathy in diseases classified elsewhere: Secondary | ICD-10-CM | POA: Insufficient documentation

## 2018-06-15 DIAGNOSIS — Z794 Long term (current) use of insulin: Secondary | ICD-10-CM | POA: Diagnosis not present

## 2018-06-15 DIAGNOSIS — R9431 Abnormal electrocardiogram [ECG] [EKG]: Secondary | ICD-10-CM

## 2018-06-15 DIAGNOSIS — E1122 Type 2 diabetes mellitus with diabetic chronic kidney disease: Secondary | ICD-10-CM

## 2018-06-15 DIAGNOSIS — I110 Hypertensive heart disease with heart failure: Secondary | ICD-10-CM | POA: Insufficient documentation

## 2018-06-15 DIAGNOSIS — I5031 Acute diastolic (congestive) heart failure: Secondary | ICD-10-CM | POA: Diagnosis not present

## 2018-06-15 DIAGNOSIS — N184 Chronic kidney disease, stage 4 (severe): Secondary | ICD-10-CM | POA: Diagnosis not present

## 2018-06-15 LAB — BASIC METABOLIC PANEL
BUN/Creatinine Ratio: 11 (ref 10–24)
BUN: 24 mg/dL (ref 8–27)
CO2: 20 mmol/L (ref 20–29)
Calcium: 8.3 mg/dL — ABNORMAL LOW (ref 8.6–10.2)
Chloride: 104 mmol/L (ref 96–106)
Creatinine, Ser: 2.14 mg/dL — ABNORMAL HIGH (ref 0.76–1.27)
GFR calc Af Amer: 35 mL/min/{1.73_m2} — ABNORMAL LOW (ref >59)
GFR calc non Af Amer: 31 mL/min/{1.73_m2} — ABNORMAL LOW (ref >59)
Glucose: 119 mg/dL — ABNORMAL HIGH (ref 65–99)
Potassium: 4.8 mmol/L (ref 3.5–5.2)
Sodium: 141 mmol/L (ref 134–144)

## 2018-06-15 NOTE — Progress Notes (Signed)
06/15/2018 Melvin Mcclure   1950/07/09  [id number]  Primary Physician Melvin Ebbs, MD Primary Cardiologist: Dr Melvin Mcclure  HPI: Melvin Mcclure is a 68 year old male seen in the office today as a post hospital follow-up.  He was admitted January 24 to January 31.  His EKG on admission showed inferior Q waves, he has no prior history of coronary disease.  He does have a history of diabetes, hypertension, and chronic renal insufficiency.  He was diuresed 8 pounds.  His creatinine runs in the 2.5 range.  Echocardiogram showed an ejection fraction of 60% with no wall motion abnormality and severe LVH.  Since discharge he has been stable.  He has been weighing himself at home.  He denies any unusual dyspnea.  Dr. Stanford Mcclure wanted the patient set up for an outpatient Lexiscan to rule out coronary disease and a cardiology PYP scan to rule out amyloidosis.   Current Outpatient Medications  Medication Sig Dispense Refill  . amLODipine (NORVASC) 10 MG tablet Take 10 mg by mouth daily.    Marland Kitchen aspirin 81 MG tablet Take 81 mg by mouth daily.    Marland Kitchen gabapentin (NEURONTIN) 100 MG capsule Take 1 capsule (100 mg total) by mouth at bedtime. 30 capsule 0  . hydrALAZINE (APRESOLINE) 50 MG tablet Take 1 tablet (50 mg total) by mouth every 8 (eight) hours. 90 tablet 0  . insulin aspart (NOVOLOG) 100 UNIT/ML injection Inject 4-8 Units into the skin 3 (three) times daily before meals. Per sliding scale    . insulin glargine (LANTUS) 100 UNIT/ML injection Inject 0.05 mLs (5 Units total) into the skin daily. 10 mL 0  . torsemide (DEMADEX) 20 MG tablet Take 1 tablet (20 mg total) by mouth daily. 30 tablet 0  . Vitamin D, Ergocalciferol, (DRISDOL) 1.25 MG (50000 UT) CAPS capsule Take 1 capsule (50,000 Units total) by mouth every 7 (seven) days. 5 capsule 0   No current facility-administered medications for this visit.     No Known Allergies  Past Medical History:  Diagnosis Date  . CHF (congestive heart failure) ([place])  05/28/2018   dx 05/28/18  . Diabetes mellitus without complication ([place])   . Hyperlipidemia   . Hypertension   . Perforating neurotrophic ulcer of foot ([place])     Social History   Socioeconomic History  . Marital status: Married    Spouse name: Not on file  . Number of children: Not on file  . Years of education: Not on file  . Highest education level: Not on file  Occupational History  . Not on file  Social Needs  . Financial resource strain: Not on file  . Food insecurity:    Worry: Not on file    Inability: Not on file  . Transportation needs:    Medical: Not on file    Non-medical: Not on file  Tobacco Use  . Smoking status: Never Smoker  . Smokeless tobacco: Never Used  Substance and Sexual Activity  . Alcohol use: No    Alcohol/week: 0.0 standard drinks  . Drug use: No  . Sexual activity: Not on file  Lifestyle  . Physical activity:    Days per week: Not on file    Minutes per session: Not on file  . Stress: Not on file  Relationships  . Social connections:    Talks on phone: Not on file    Gets together: Not on file    Attends religious service: Not on file    Active member  of club or organization: Not on file    Attends meetings of clubs or organizations: Not on file    Relationship status: Not on file  . Intimate partner violence:    Fear of current or ex partner: Not on file    Emotionally abused: Not on file    Physically abused: Not on file    Forced sexual activity: Not on file  Other Topics Concern  . Not on file  Social History Narrative  . Not on file     Family History  Problem Relation Age of Onset  . Diabetes Mellitus II Mother   . Hypertension Father      Review of Systems: General: negative for chills, fever, night sweats or weight changes.  Cardiovascular: negative for chest pain, edema, orthopnea, palpitations, paroxysmal nocturnal dyspnea  Dermatological: negative for rash Respiratory: negative for cough or  wheezing Urologic: negative for hematuria Abdominal: negative for nausea, vomiting, diarrhea, bright red blood per rectum, melena, or hematemesis Neurologic: negative for visual changes, syncope, or dizziness All other systems reviewed and are otherwise negative except as noted above.    Blood pressure (!) 148/78, pulse 86, height 5\' 10"  (1.778 m), weight 204 lb (92.5 kg), SpO2 97 %.  General appearance: alert, cooperative and no distress Lungs: clear to auscultation bilaterally Heart: regular rate and rhythm Extremities: no edema Skin: Skin color, texture, turgor normal. No rashes or lesions Neurologic: Grossly normal  EKG 05/29/2018- NSR, RBBB, LVH, inferior Qs  ASSESSMENT AND PLAN:   Acute diastolic CHF (congestive heart failure) ([place]) Admitted 1/24-1/31/2020-currently stable  CKD stage 4 due to type 2 diabetes mellitus ([place]) Followed at [facility] now. Check BMP  Hypertensive cardiomyopathy, with heart failure (HCC) EF 60% by echo with severe LVH  Type 2 diabetes mellitus with stage 3 chronic kidney disease (HCC) IDDM with CRI   PLAN  Repeat B/P by me-128/ 70. Check BMP, check Lexiscan, check PYP can- f/u Dr Melvin Mcclure after the above test.   Kerin Ransom PA-C 06/15/2018 10:29 AM

## 2018-06-15 NOTE — Assessment & Plan Note (Signed)
IDDM with CRI

## 2018-06-15 NOTE — Assessment & Plan Note (Signed)
Followed at [facility] now. Check BMP

## 2018-06-15 NOTE — Patient Instructions (Addendum)
Medication Instructions:  Your physician recommends that you continue on your current medications as directed. Please refer to the Current Medication list given to you today. If you need a refill on your cardiac medications before your next appointment, please call your pharmacy.   Lab work: Your physician recommends that you return for lab work in: TODAY-BMET If you have labs (blood work) drawn today and your tests are completely normal, you will receive your results only by: Marland Kitchen MyChart Message (if you have MyChart) OR . A paper copy in the mail If you have any lab test that is abnormal or we need to change your treatment, we will call you to review the results.  Testing/Procedures: Your physician has requested that you have a lexiscan myoview. For further information please visit [email or web address]. Please follow instruction sheet, as given. NO MEDICATIONS TO HOLD PRIOR TO TEST  SCHEDULE A MYOCARDIAL AMYLOID IMAGING TEST-at [facility]  Prep:  Patients should not wear any fragrances  (No restrictions with medications/food) Imaging:  An IV is started and the patient is intravenously injected with the radiopharmaceutical. (This is Tc57m Pyrophosphate which is a different radiopharmaceutical from what is used in myocardial perfusion imaging; MPI) The patient does not feel differently from this injection.  The patient is then escorted to the nuclear waiting room for this radiopharmaceutical to circulate through their system for 1 hour  At the completion of the 1 hour wait, the patient is taken to the camera room and images are performed. All the patient does is recline on the nuclear table and remain still as the images are made with the camera passing around the patient. This imaging time is approximately 20-30 minutes.  The patient is released  Total time of test is approximately 2 hours  SCHEDULE TEST 4-5 DAYS APART  Follow-Up: At [facility], you and your health needs  are our priority.  As part of our continuing mission to provide you with exceptional heart care, we have created designated Provider Care Teams.  These Care Teams include your primary Cardiologist (physician) and Advanced Practice Providers (APPs -  Physician Assistants and Nurse Practitioners) who all work together to provide you with the care you need, when you need it.  . Your physician recommends that you schedule a follow-up appointment in: 3-4 weeks with Dr Stanford Breed or after all testing is complete.  Any Other Special Instructions Will Be Listed Below (If Applicable).

## 2018-06-15 NOTE — Assessment & Plan Note (Signed)
EF 60% by echo with severe LVH

## 2018-06-15 NOTE — Assessment & Plan Note (Signed)
Admitted 1/24-1/31/2020-currently stable

## 2018-06-22 DIAGNOSIS — E1122 Type 2 diabetes mellitus with diabetic chronic kidney disease: Secondary | ICD-10-CM | POA: Diagnosis not present

## 2018-06-22 DIAGNOSIS — N183 Chronic kidney disease, stage 3 (moderate): Secondary | ICD-10-CM | POA: Diagnosis not present

## 2018-06-22 DIAGNOSIS — R42 Dizziness and giddiness: Secondary | ICD-10-CM | POA: Diagnosis not present

## 2018-06-22 DIAGNOSIS — R809 Proteinuria, unspecified: Secondary | ICD-10-CM | POA: Diagnosis not present

## 2018-06-22 DIAGNOSIS — I129 Hypertensive chronic kidney disease with stage 1 through stage 4 chronic kidney disease, or unspecified chronic kidney disease: Secondary | ICD-10-CM | POA: Diagnosis not present

## 2018-06-22 DIAGNOSIS — H8111 Benign paroxysmal vertigo, right ear: Secondary | ICD-10-CM | POA: Diagnosis not present

## 2018-06-22 DIAGNOSIS — R319 Hematuria, unspecified: Secondary | ICD-10-CM | POA: Diagnosis not present

## 2018-06-23 ENCOUNTER — Telehealth (HOSPITAL_COMMUNITY): Payer: Self-pay

## 2018-06-23 ENCOUNTER — Encounter (HOSPITAL_COMMUNITY): Payer: Medicare Other

## 2018-06-23 NOTE — Telephone Encounter (Signed)
Encounter complete. 

## 2018-06-24 ENCOUNTER — Encounter (HOSPITAL_COMMUNITY): Payer: Medicare Other

## 2018-06-25 ENCOUNTER — Ambulatory Visit (HOSPITAL_COMMUNITY)
Admission: RE | Admit: 2018-06-25 | Payer: Medicare Other | Source: Ambulatory Visit | Attending: Cardiology | Admitting: Cardiology

## 2018-07-02 ENCOUNTER — Telehealth (HOSPITAL_COMMUNITY): Payer: Self-pay

## 2018-07-02 ENCOUNTER — Encounter (HOSPITAL_COMMUNITY): Payer: Medicare Other

## 2018-07-02 NOTE — Telephone Encounter (Signed)
Encounter complete. 

## 2018-07-07 ENCOUNTER — Ambulatory Visit (HOSPITAL_COMMUNITY)
Admission: RE | Admit: 2018-07-07 | Discharge: 2018-07-07 | Disposition: A | Discharge status: Home / Self Care | Payer: Medicare Other | Source: Ambulatory Visit | Attending: Cardiovascular Disease | Admitting: Cardiovascular Disease

## 2018-07-07 DIAGNOSIS — I5031 Acute diastolic (congestive) heart failure: Secondary | ICD-10-CM | POA: Diagnosis not present

## 2018-07-07 DIAGNOSIS — R9431 Abnormal electrocardiogram [ECG] [EKG]: Secondary | ICD-10-CM | POA: Diagnosis not present

## 2018-07-07 DIAGNOSIS — N184 Chronic kidney disease, stage 4 (severe): Secondary | ICD-10-CM | POA: Diagnosis not present

## 2018-07-07 DIAGNOSIS — E1122 Type 2 diabetes mellitus with diabetic chronic kidney disease: Secondary | ICD-10-CM | POA: Insufficient documentation

## 2018-07-07 LAB — MYOCARDIAL PERFUSION IMAGING
LV dias vol: 135 mL (ref 62–150)
LV sys vol: 58 mL
Peak HR: 88 {beats}/min
Rest HR: 75 {beats}/min
SDS: 1
SRS: 4
SSS: 5
TID: 1.19

## 2018-07-07 MED ORDER — TECHNETIUM TC 99M TETROFOSMIN IV KIT
11.0000 | PACK | Freq: Once | INTRAVENOUS | Status: AC | PRN
  Administered 2018-07-07: 11 via INTRAVENOUS
  Filled 2018-07-07: qty 11

## 2018-07-07 MED ORDER — REGADENOSON 0.4 MG/5ML IV SOLN
0.4000 mg | Freq: Once | INTRAVENOUS | Status: AC
  Administered 2018-07-07: 0.4 mg via INTRAVENOUS

## 2018-07-07 MED ORDER — TECHNETIUM TC 99M TETROFOSMIN IV KIT
32.8000 | PACK | Freq: Once | INTRAVENOUS | Status: AC | PRN
  Administered 2018-07-07: 32.8 via INTRAVENOUS
  Filled 2018-07-07: qty 33

## 2018-07-08 DIAGNOSIS — E1142 Type 2 diabetes mellitus with diabetic polyneuropathy: Secondary | ICD-10-CM | POA: Diagnosis not present

## 2018-07-08 DIAGNOSIS — I5032 Chronic diastolic (congestive) heart failure: Secondary | ICD-10-CM | POA: Diagnosis not present

## 2018-07-08 DIAGNOSIS — N183 Chronic kidney disease, stage 3 (moderate): Secondary | ICD-10-CM | POA: Diagnosis not present

## 2018-07-08 DIAGNOSIS — I10 Essential (primary) hypertension: Secondary | ICD-10-CM | POA: Diagnosis not present

## 2018-07-12 ENCOUNTER — Telehealth: Payer: Self-pay

## 2018-07-12 NOTE — Telephone Encounter (Signed)
Received a call from patient calling for myoview results.[place] PA advised results looked good.Advised to keep appointment with Dr.Crenshaw 08/16/18 at 12:20 pm.

## 2018-08-03 ENCOUNTER — Telehealth: Payer: Self-pay | Admitting: *Deleted

## 2018-08-03 NOTE — Telephone Encounter (Signed)
Left message for pt to call he has 2 appts in the system. 08-16-18 and 09-06-18. Need to cancel April appt.

## 2018-08-05 ENCOUNTER — Telehealth: Payer: Self-pay | Admitting: Cardiology

## 2018-08-05 NOTE — Telephone Encounter (Signed)
Left message for patient if does not want to keep may appointment, okay, his stress test was normal. Did ask patient to reschedule out to august, he has severe LVH, just to get on a regular schedule for follow up.

## 2018-08-05 NOTE — Telephone Encounter (Signed)
Left message for patient, 08-16-2018 appt canceled.

## 2018-08-05 NOTE — Telephone Encounter (Signed)
New Message    Pt is calling back and he understands his April appt was canceled. Pt wants to know if he needs to keep his May appt   Please call

## 2018-08-12 DIAGNOSIS — I10 Essential (primary) hypertension: Secondary | ICD-10-CM | POA: Diagnosis not present

## 2018-08-12 DIAGNOSIS — I5032 Chronic diastolic (congestive) heart failure: Secondary | ICD-10-CM | POA: Diagnosis not present

## 2018-08-12 DIAGNOSIS — N183 Chronic kidney disease, stage 3 (moderate): Secondary | ICD-10-CM | POA: Diagnosis not present

## 2018-08-12 DIAGNOSIS — E1142 Type 2 diabetes mellitus with diabetic polyneuropathy: Secondary | ICD-10-CM | POA: Diagnosis not present

## 2018-08-16 ENCOUNTER — Ambulatory Visit: Payer: Medicare Other | Admitting: Cardiology

## 2018-08-27 ENCOUNTER — Telehealth: Payer: Self-pay

## 2018-08-27 NOTE — Telephone Encounter (Signed)
Left detailed voice message for the patient about switching his upcoming appointment on 09/06/18 at 9:20AM with Dr. Stanford Breed from an in office visit to a virtual visit which is a video visit with Dr. Stanford Breed on the same day and time if possible and that he can call the office back with his decision

## 2018-09-03 ENCOUNTER — Telehealth: Payer: Self-pay | Admitting: *Deleted

## 2018-09-03 NOTE — Progress Notes (Deleted)
HPI: Follow-up congestive heart failure.  Patient admitted January 2028 with new onset heart failure.  Echocardiogram showed normal LV function and severe left ventricular hypertrophy.  Patient was diuresed with improvement in symptoms.  There was note of inferior Q waves on electrocardiogram.  Patient was felt to be high risk for contrast nephropathy given baseline renal insufficiency.  Nuclear study was performed as an outpatient and showed ejection fraction 57%.  There was diaphragmatic attenuation but no ischemia.  I also suggested PYP scan to rule out amyloid.  Since last seen  Current Outpatient Medications  Medication Sig Dispense Refill  . amLODipine (NORVASC) 10 MG tablet Take 10 mg by mouth daily.    Marland Kitchen aspirin 81 MG tablet Take 81 mg by mouth daily.    Marland Kitchen gabapentin (NEURONTIN) 100 MG capsule Take 1 capsule (100 mg total) by mouth at bedtime. 30 capsule 0  . hydrALAZINE (APRESOLINE) 50 MG tablet Take 1 tablet (50 mg total) by mouth every 8 (eight) hours. 90 tablet 0  . insulin aspart (NOVOLOG) 100 UNIT/ML injection Inject 4-8 Units into the skin 3 (three) times daily before meals. Per sliding scale    . insulin glargine (LANTUS) 100 UNIT/ML injection Inject 0.05 mLs (5 Units total) into the skin daily. 10 mL 0  . torsemide (DEMADEX) 20 MG tablet Take 1 tablet (20 mg total) by mouth daily. 30 tablet 0  . Vitamin D, Ergocalciferol, (DRISDOL) 1.25 MG (50000 UT) CAPS capsule Take 1 capsule (50,000 Units total) by mouth every 7 (seven) days. 5 capsule 0   No current facility-administered medications for this visit.      Past Medical History:  Diagnosis Date  . CHF (congestive heart failure) ([place]) 05/28/2018   dx 05/28/18  . Diabetes mellitus without complication ([place])   . Hyperlipidemia   . Hypertension   . Perforating neurotrophic ulcer of foot ([place])     Past Surgical History:  Procedure Laterality Date  . ACHILLES TENDON REPAIR    . CYST EXCISION      Social History    Socioeconomic History  . Marital status: Married    Spouse name: Not on file  . Number of children: Not on file  . Years of education: Not on file  . Highest education level: Not on file  Occupational History  . Not on file  Social Needs  . Financial resource strain: Not on file  . Food insecurity:    Worry: Not on file    Inability: Not on file  . Transportation needs:    Medical: Not on file    Non-medical: Not on file  Tobacco Use  . Smoking status: Never Smoker  . Smokeless tobacco: Never Used  Substance and Sexual Activity  . Alcohol use: No    Alcohol/week: 0.0 standard drinks  . Drug use: No  . Sexual activity: Not on file  Lifestyle  . Physical activity:    Days per week: Not on file    Minutes per session: Not on file  . Stress: Not on file  Relationships  . Social connections:    Talks on phone: Not on file    Gets together: Not on file    Attends religious service: Not on file    Active member of club or organization: Not on file    Attends meetings of clubs or organizations: Not on file    Relationship status: Not on file  . Intimate partner violence:    Fear of  current or ex partner: Not on file    Emotionally abused: Not on file    Physically abused: Not on file    Forced sexual activity: Not on file  Other Topics Concern  . Not on file  Social History Narrative  . Not on file    Family History  Problem Relation Age of Onset  . Diabetes Mellitus II Mother   . Hypertension Father     ROS: no fevers or chills, productive cough, hemoptysis, dysphasia, odynophagia, melena, hematochezia, dysuria, hematuria, rash, seizure activity, orthopnea, PND, pedal edema, claudication. Remaining systems are negative.  Physical Exam: Well-developed well-nourished in no acute distress.  Skin is warm and dry.  HEENT is normal.  Neck is supple.  Chest is clear to auscultation with normal expansion.  Cardiovascular exam is regular rate and rhythm.  Abdominal  exam nontender or distended. No masses palpated. Extremities show no edema. neuro grossly intact  ECG- personally reviewed  A/P  1 chronic diastolic congestive heart failure-patient appears to be euvolemic on examination.  We will continue with present dose of diuretic.  Check potassium and renal function.  We will arrange a PYP scan to screen for amyloid given severe left ventricular hypertrophy on echocardiogram.  2 Abnormal electrocardiogram-possible inferior infarct noted on previous ECG.  However follow-up nuclear study showed no ischemia.  Plan medical therapy.  3 hypertension-patient's blood pressure is controlled.  Continue present medications and follow.  4 chronic stage III kidney disease-patient will need close follow-up with nephrology.  Kirk Ruths, MD

## 2018-09-03 NOTE — Telephone Encounter (Signed)
Left message for patient to call regarding appointment with Dr. Stanford Breed on 09/06/18

## 2018-09-03 NOTE — Telephone Encounter (Signed)
Patient is returning call.  °

## 2018-09-06 ENCOUNTER — Ambulatory Visit: Payer: Medicare Other | Admitting: Cardiology

## 2018-09-06 ENCOUNTER — Telehealth: Payer: Self-pay

## 2018-09-06 NOTE — Telephone Encounter (Signed)
Left voicemail for patient to [place] office to change his office visit to virtual visit due to covid 19

## 2018-09-20 DIAGNOSIS — I5032 Chronic diastolic (congestive) heart failure: Secondary | ICD-10-CM | POA: Diagnosis not present

## 2018-09-20 DIAGNOSIS — E1121 Type 2 diabetes mellitus with diabetic nephropathy: Secondary | ICD-10-CM | POA: Diagnosis not present

## 2018-09-20 DIAGNOSIS — I10 Essential (primary) hypertension: Secondary | ICD-10-CM | POA: Diagnosis not present

## 2018-09-20 DIAGNOSIS — N183 Chronic kidney disease, stage 3 (moderate): Secondary | ICD-10-CM | POA: Diagnosis not present

## 2018-09-30 DIAGNOSIS — D631 Anemia in chronic kidney disease: Secondary | ICD-10-CM | POA: Diagnosis not present

## 2018-09-30 DIAGNOSIS — R809 Proteinuria, unspecified: Secondary | ICD-10-CM | POA: Diagnosis not present

## 2018-09-30 DIAGNOSIS — E1129 Type 2 diabetes mellitus with other diabetic kidney complication: Secondary | ICD-10-CM | POA: Diagnosis not present

## 2018-09-30 DIAGNOSIS — E1122 Type 2 diabetes mellitus with diabetic chronic kidney disease: Secondary | ICD-10-CM | POA: Diagnosis not present

## 2018-09-30 DIAGNOSIS — N2581 Secondary hyperparathyroidism of renal origin: Secondary | ICD-10-CM | POA: Diagnosis not present

## 2018-09-30 DIAGNOSIS — I129 Hypertensive chronic kidney disease with stage 1 through stage 4 chronic kidney disease, or unspecified chronic kidney disease: Secondary | ICD-10-CM | POA: Diagnosis not present

## 2018-09-30 DIAGNOSIS — R319 Hematuria, unspecified: Secondary | ICD-10-CM | POA: Diagnosis not present

## 2018-09-30 DIAGNOSIS — N183 Chronic kidney disease, stage 3 (moderate): Secondary | ICD-10-CM | POA: Diagnosis not present

## 2018-10-19 DIAGNOSIS — E1129 Type 2 diabetes mellitus with other diabetic kidney complication: Secondary | ICD-10-CM | POA: Diagnosis not present

## 2018-11-11 DIAGNOSIS — K30 Functional dyspepsia: Secondary | ICD-10-CM | POA: Diagnosis not present

## 2018-11-11 DIAGNOSIS — E1142 Type 2 diabetes mellitus with diabetic polyneuropathy: Secondary | ICD-10-CM | POA: Diagnosis not present

## 2018-11-11 DIAGNOSIS — I10 Essential (primary) hypertension: Secondary | ICD-10-CM | POA: Diagnosis not present

## 2018-11-11 DIAGNOSIS — I5032 Chronic diastolic (congestive) heart failure: Secondary | ICD-10-CM | POA: Diagnosis not present

## 2018-11-18 DIAGNOSIS — E1121 Type 2 diabetes mellitus with diabetic nephropathy: Secondary | ICD-10-CM | POA: Diagnosis not present

## 2018-11-18 DIAGNOSIS — I5032 Chronic diastolic (congestive) heart failure: Secondary | ICD-10-CM | POA: Diagnosis not present

## 2018-11-18 DIAGNOSIS — N529 Male erectile dysfunction, unspecified: Secondary | ICD-10-CM | POA: Diagnosis not present

## 2018-11-18 DIAGNOSIS — R071 Chest pain on breathing: Secondary | ICD-10-CM | POA: Diagnosis not present

## 2018-11-18 DIAGNOSIS — I10 Essential (primary) hypertension: Secondary | ICD-10-CM | POA: Diagnosis not present

## 2018-12-08 ENCOUNTER — Other Ambulatory Visit: Payer: Self-pay

## 2018-12-08 DIAGNOSIS — Z20822 Contact with and (suspected) exposure to covid-19: Secondary | ICD-10-CM

## 2018-12-09 DIAGNOSIS — E1121 Type 2 diabetes mellitus with diabetic nephropathy: Secondary | ICD-10-CM | POA: Diagnosis not present

## 2018-12-09 DIAGNOSIS — I10 Essential (primary) hypertension: Secondary | ICD-10-CM | POA: Diagnosis not present

## 2018-12-09 DIAGNOSIS — R1011 Right upper quadrant pain: Secondary | ICD-10-CM | POA: Diagnosis not present

## 2018-12-09 DIAGNOSIS — I5032 Chronic diastolic (congestive) heart failure: Secondary | ICD-10-CM | POA: Diagnosis not present

## 2018-12-09 LAB — NOVEL CORONAVIRUS, NAA: SARS-CoV-2, NAA: NOT DETECTED

## 2018-12-14 ENCOUNTER — Telehealth: Payer: Self-pay | Admitting: Internal Medicine

## 2018-12-14 NOTE — Telephone Encounter (Signed)
Pt aware covid lab test negative, not detected °

## 2018-12-15 ENCOUNTER — Other Ambulatory Visit: Payer: Self-pay

## 2018-12-15 ENCOUNTER — Emergency Department (HOSPITAL_COMMUNITY)
Admission: EM | Admit: 2018-12-15 | Discharge: 2018-12-16 | Disposition: A | Discharge status: Home / Self Care | Payer: Medicare Other | Attending: Emergency Medicine | Admitting: Emergency Medicine

## 2018-12-15 ENCOUNTER — Emergency Department (HOSPITAL_COMMUNITY): Payer: Medicare Other

## 2018-12-15 DIAGNOSIS — Z79899 Other long term (current) drug therapy: Secondary | ICD-10-CM | POA: Insufficient documentation

## 2018-12-15 DIAGNOSIS — E1122 Type 2 diabetes mellitus with diabetic chronic kidney disease: Secondary | ICD-10-CM | POA: Insufficient documentation

## 2018-12-15 DIAGNOSIS — R079 Chest pain, unspecified: Secondary | ICD-10-CM | POA: Diagnosis not present

## 2018-12-15 DIAGNOSIS — R1011 Right upper quadrant pain: Secondary | ICD-10-CM

## 2018-12-15 DIAGNOSIS — R0789 Other chest pain: Secondary | ICD-10-CM | POA: Diagnosis not present

## 2018-12-15 DIAGNOSIS — Z7982 Long term (current) use of aspirin: Secondary | ICD-10-CM | POA: Insufficient documentation

## 2018-12-15 DIAGNOSIS — I5032 Chronic diastolic (congestive) heart failure: Secondary | ICD-10-CM | POA: Insufficient documentation

## 2018-12-15 DIAGNOSIS — Z794 Long term (current) use of insulin: Secondary | ICD-10-CM | POA: Diagnosis not present

## 2018-12-15 DIAGNOSIS — N183 Chronic kidney disease, stage 3 (moderate): Secondary | ICD-10-CM | POA: Diagnosis not present

## 2018-12-15 DIAGNOSIS — I130 Hypertensive heart and chronic kidney disease with heart failure and stage 1 through stage 4 chronic kidney disease, or unspecified chronic kidney disease: Secondary | ICD-10-CM | POA: Insufficient documentation

## 2018-12-15 LAB — COMPREHENSIVE METABOLIC PANEL
ALT: 16 U/L (ref 0–44)
AST: 17 U/L (ref 15–41)
Albumin: 3.4 g/dL — ABNORMAL LOW (ref 3.5–5.0)
Alkaline Phosphatase: 84 U/L (ref 38–126)
Anion gap: 9 (ref 5–15)
BUN: 26 mg/dL — ABNORMAL HIGH (ref 8–23)
CO2: 24 mmol/L (ref 22–32)
Calcium: 7.6 mg/dL — ABNORMAL LOW (ref 8.9–10.3)
Chloride: 103 mmol/L (ref 98–111)
Creatinine, Ser: 2.28 mg/dL — ABNORMAL HIGH (ref 0.61–1.24)
GFR calc Af Amer: 33 mL/min — ABNORMAL LOW (ref >60)
GFR calc non Af Amer: 28 mL/min — ABNORMAL LOW (ref >60)
Glucose, Bld: 133 mg/dL — ABNORMAL HIGH (ref 70–99)
Potassium: 5 mmol/L (ref 3.5–5.1)
Sodium: 136 mmol/L (ref 135–145)
Total Bilirubin: 0.4 mg/dL (ref 0.3–1.2)
Total Protein: 7.8 g/dL (ref 6.5–8.1)

## 2018-12-15 LAB — CBC
HCT: 37.3 % — ABNORMAL LOW (ref 39.0–52.0)
Hemoglobin: 11.9 g/dL — ABNORMAL LOW (ref 13.0–17.0)
MCH: 27.5 pg (ref 26.0–34.0)
MCHC: 31.9 g/dL (ref 30.0–36.0)
MCV: 86.3 fL (ref 80.0–100.0)
Platelets: 267 10*3/uL (ref 150–400)
RBC: 4.32 MIL/uL (ref 4.22–5.81)
RDW: 14.5 % (ref 11.5–15.5)
WBC: 9 10*3/uL (ref 4.0–10.5)
nRBC: 0 % (ref 0.0–0.2)

## 2018-12-15 LAB — LIPASE, BLOOD: Lipase: 56 U/L — ABNORMAL HIGH (ref 11–51)

## 2018-12-15 LAB — TROPONIN I (HIGH SENSITIVITY): Troponin I (High Sensitivity): 8 ng/L (ref <18)

## 2018-12-15 MED ORDER — SODIUM CHLORIDE 0.9% FLUSH: 3.0000 mL | Freq: Once | INTRAVENOUS | Status: DC

## 2018-12-15 MED ORDER — ONDANSETRON 4 MG PO TBDP: 4.0000 mg | ORAL_TABLET | Freq: Once | ORAL | Status: DC | PRN

## 2018-12-15 NOTE — ED Triage Notes (Signed)
Patient reports R lateral chest/RUQ pain x 3-4 days, states it's been keeping him up at night. Pain accompanied by nausea today. Denies dizziness, shortness of breath. Resp e/u, skin w/d.

## 2018-12-16 ENCOUNTER — Emergency Department (HOSPITAL_COMMUNITY): Payer: Medicare Other

## 2018-12-16 DIAGNOSIS — R1011 Right upper quadrant pain: Secondary | ICD-10-CM | POA: Diagnosis not present

## 2018-12-16 LAB — TROPONIN I (HIGH SENSITIVITY): Troponin I (High Sensitivity): 7 ng/L (ref <18)

## 2018-12-16 LAB — CBG MONITORING, ED: Glucose-Capillary: 135 mg/dL — ABNORMAL HIGH (ref 70–99)

## 2018-12-16 MED ORDER — METOCLOPRAMIDE HCL 5 MG/ML IJ SOLN
10.0000 mg | INTRAMUSCULAR | Status: AC
  Administered 2018-12-16: 10 mg via INTRAVENOUS
  Filled 2018-12-16: qty 2

## 2018-12-16 MED ORDER — METOCLOPRAMIDE HCL 10 MG PO TABS: 10.0000 mg | ORAL_TABLET | Freq: Four times a day (QID) | ORAL | 0 refills | Status: DC

## 2018-12-16 MED ORDER — FENTANYL CITRATE (PF) 100 MCG/2ML IJ SOLN
50.0000 ug | Freq: Once | INTRAMUSCULAR | Status: AC
  Administered 2018-12-16: 50 ug via INTRAVENOUS
  Filled 2018-12-16: qty 2

## 2018-12-16 NOTE — ED Provider Notes (Signed)
[facility] EMERGENCY DEPARTMENT Provider Note   CSN: [id number] Arrival date & time: 12/15/18  1740    History   Chief Complaint Chief Complaint  Patient presents with  . Chest Pain  . Abdominal Pain    HPI Melvin Mcclure is a 68 y.o. male.    68 y/o male with hx of DM, CHF (LVEF 60-65%), HLD, HTN, CKD presents to the emergency department for evaluation of right upper quadrant pain.  He has been experiencing this pain for "weeks", per his wife.  Pain has become more constant over the past 3 to 4 days.  It radiates towards his lower chest and can be slightly aggravated when taking a deep breath.  Pain waxing and waning in severity and unrelieved with home tramadol.  He has not noticed any worsening pain with PO intake, but has been eating less due to associated nausea. He has not had any vomiting.  Wife reporting issues with constipation.  He did have a bowel movement yesterday.  No associated fevers, SOB, urinary symptoms, melena, hematochezia.  No hx of abdominal surgeries.  The history is provided by the patient. No language interpreter was used.  Chest Pain Associated symptoms: abdominal pain   Abdominal Pain Associated symptoms: chest pain     Past Medical History:  Diagnosis Date  . CHF (congestive heart failure) ([place]) 05/28/2018   dx 05/28/18  . Diabetes mellitus without complication ([place])   . Hyperlipidemia   . Hypertension   . Perforating neurotrophic ulcer of foot [facility])     Patient Active Problem List   Diagnosis Date Noted  . Hypertensive cardiomyopathy, with heart failure ([place]) 06/15/2018  . Acute diastolic CHF (congestive heart failure) ([place]) 05/28/2018  . Type 2 diabetes mellitus with stage 3 chronic kidney disease ([place]) 05/28/2018  . CKD stage 4 due to type 2 diabetes mellitus ([place]) 05/28/2018  . CHF (congestive heart failure) ([place]) 05/28/2018    Past Surgical History:  Procedure Laterality Date  . ACHILLES TENDON REPAIR    . CYST  EXCISION          Home Medications    Prior to Admission medications   Medication Sig Start Date End Date Taking? Authorizing Provider  amLODipine (NORVASC) 10 MG tablet Take 10 mg by mouth daily.   Yes [provider]  aspirin 81 MG tablet Take 81 mg by mouth daily.   Yes [provider]  CALCIUM PO Take 1 tablet by mouth daily.   Yes [provider]  ferrous sulfate 325 (65 FE) MG tablet Take 325 mg by mouth daily with breakfast.   Yes [provider]  gabapentin (NEURONTIN) 100 MG capsule Take 1 capsule (100 mg total) by mouth at bedtime. 06/04/18  Yes Dessa Phi, DO  hydrALAZINE (APRESOLINE) 50 MG tablet Take 1 tablet (50 mg total) by mouth every 8 (eight) hours. Patient taking differently: Take 100 mg by mouth every 8 (eight) hours.  06/04/18  Yes Dessa Phi, DO  insulin aspart (NOVOLOG) 100 UNIT/ML injection Inject 4-8 Units into the skin daily as needed for high blood sugar. Per sliding scale   Yes [provider]  insulin glargine (LANTUS) 100 UNIT/ML injection Inject 0.05 mLs (5 Units total) into the skin daily. Patient taking differently: Inject 5 Units into the skin daily as needed (sugar levels).  06/04/18  Yes Dessa Phi, DO  senna-docusate (SENOKOT-S) 8.6-50 MG tablet Take 1 tablet by mouth 2 (two) times daily.   Yes [provider]  torsemide (DEMADEX) 20 MG tablet Take 1 tablet (20 mg total) by mouth daily. 06/05/18  Yes Dessa Phi, DO  Vitamin D, Ergocalciferol, (DRISDOL) 1.25 MG (50000 UT) CAPS capsule Take 1 capsule (50,000 Units total) by mouth every 7 (seven) days. 06/10/18  Yes Dessa Phi, DO  metoCLOPramide (REGLAN) 10 MG tablet Take 1 tablet (10 mg total) by mouth every 6 (six) hours. 12/16/18   Antonietta Breach, PA-C    Family History Family History  Problem Relation Age of Onset  . Diabetes Mellitus II Mother   . Hypertension Father     Social History Social History   Tobacco Use  . Smoking  status: Never Smoker  . Smokeless tobacco: Never Used  Substance Use Topics  . Alcohol use: No    Alcohol/week: 0.0 standard drinks  . Drug use: No     Allergies   Patient has no known allergies.   Review of Systems Review of Systems  Cardiovascular: Positive for chest pain.  Gastrointestinal: Positive for abdominal pain.  Ten systems reviewed and are negative for acute change, except as noted in the HPI.     Physical Exam Updated Vital Signs BP (!) 149/84   Pulse 79   Temp 97.8 F (36.6 C) (Oral)   Resp 16   SpO2 91%   Physical Exam Vitals signs and nursing note reviewed.  Constitutional:      General: He is not in acute distress.    Appearance: He is well-developed. He is not diaphoretic.     Comments: Appears uncomfortable, but nontoxic  HENT:     Head: Normocephalic and atraumatic.  Eyes:     General: No scleral icterus.    Conjunctiva/sclera: Conjunctivae normal.  Neck:     Musculoskeletal: Normal range of motion.  Cardiovascular:     Rate and Rhythm: Normal rate and regular rhythm.     Pulses: Normal pulses.  Pulmonary:     Effort: Pulmonary effort is normal. No respiratory distress.     Breath sounds: No wheezing.     Comments: Respirations even and unlabored Abdominal:     Comments: Abdomen obese, soft, mildly distended with TTP in the RUQ. Minimal guarding. Negative Murphy's sign. No peritoneal signs.  Musculoskeletal: Normal range of motion.     Comments: No significant lower extremity edema.  Skin:    General: Skin is warm and dry.     Coloration: Skin is not pale.     Findings: No erythema or rash.  Neurological:     General: No focal deficit present.     Mental Status: He is alert and oriented to person, place, and time.     Coordination: Coordination normal.     Comments: GCS 15.  Speech is clear, goal oriented.  Patient answers questions appropriately and follows commands.  No focal deficits noted.  Moving all extremities spontaneously.   Psychiatric:        Behavior: Behavior normal.      ED Treatments / Results  Labs (all labs ordered are listed, but only abnormal results are displayed) Labs Reviewed  CBC - Abnormal; Notable for the following components:      Result Value   Hemoglobin 11.9 (*)    HCT 37.3 (*)    All other components within normal limits  LIPASE, BLOOD - Abnormal; Notable for the following components:   Lipase 56 (*)    All other components within normal limits  COMPREHENSIVE METABOLIC PANEL - Abnormal; Notable for the following components:  Glucose, Bld 133 (*)    BUN 26 (*)    Creatinine, Ser 2.28 (*)    Calcium 7.6 (*)    Albumin 3.4 (*)    GFR calc non Af Amer 28 (*)    GFR calc Af Amer 33 (*)    All other components within normal limits  CBG MONITORING, ED - Abnormal; Notable for the following components:   Glucose-Capillary 135 (*)    All other components within normal limits  TROPONIN I (HIGH SENSITIVITY)  TROPONIN I (HIGH SENSITIVITY)    EKG EKG Interpretation  Date/Time:  Wednesday December 15 2018 17:51:20 EDT Ventricular Rate:  78 PR Interval:  168 QRS Duration: 126 QT Interval:  440 QTC Calculation: 501 R Axis:   -40 Text Interpretation:  Normal sinus rhythm Possible Left atrial enlargement Left axis deviation Right bundle branch block Left ventricular hypertrophy with QRS widening T wave abnormality, consider lateral ischemia Abnormal ECG No acute changes Confirmed by Varney Biles [phone]) on 12/16/2018 1:24:49 AM   Radiology Ct Abdomen Pelvis Wo Contrast  Result Date: 12/16/2018 CLINICAL DATA:  68 year old male with early satiety. Right lateral chest and right upper quadrant abdominal pain. EXAM: CT CHEST, ABDOMEN AND PELVIS WITHOUT CONTRAST TECHNIQUE: Multidetector CT imaging of the chest, abdomen and pelvis was performed following the standard protocol without IV contrast. COMPARISON:  Chest CT dated 09/25/2015 and abdominal ultrasound dated 12/16/2018 FINDINGS:  Evaluation of this exam is limited in the absence of intravenous contrast. CT CHEST FINDINGS Cardiovascular: There is no cardiomegaly. Trace pericardial effusion. Mild coronary vascular calcification involving the LAD. Mild atherosclerotic calcification the aorta. The central pulmonary arteries are grossly unremarkable on this noncontrast CT. Mediastinum/Nodes: No hilar or mediastinal adenopathy. The esophagus and thyroid gland are grossly unremarkable. No mediastinal fluid collection. Lungs/Pleura: Minimal bibasilar dependent atelectatic changes. There is no focal consolidation, pleural effusion, or pneumothorax. The central airways are patent. Musculoskeletal: Degenerative changes of the spine. No acute osseous pathology. CT ABDOMEN PELVIS FINDINGS No intra-abdominal free air or free fluid. Hepatobiliary: The liver is unremarkable. No intrahepatic biliary ductal dilatation. No calcified gallstone or pericholecystic fluid. Pancreas: Unremarkable. No pancreatic ductal dilatation or surrounding inflammatory changes. Spleen: Normal in size without focal abnormality. Adrenals/Urinary Tract: The adrenal glands are unremarkable. There is no hydronephrosis or nephrolithiasis on either side. The visualized ureters and urinary bladder appear unremarkable. Stomach/Bowel: Evaluation of the bowel is limited in the absence of oral contrast. There is moderate stool throughout the colon. There is no bowel obstruction or active inflammation. The appendix is normal. Vascular/Lymphatic: The abdominal aorta and IVC are unremarkable on this noncontrast CT. No portal venous gas. Mildly enlarged left para-aortic lymph node measures 16 mm in short axis of indeterminate etiology or clinical significance, possibly reactive. Attention on follow-up imaging recommended. Reproductive: The prostate and seminal vesicles are grossly unremarkable. No pelvic mass. Other: Small fat containing right inguinal hernia. No fluid collection or  inflammation. A small fat containing umbilical hernia is also noted. Musculoskeletal: Degenerative changes of the spine. No acute osseous pathology. IMPRESSION: 1. No acute intrathoracic, abdominal, or pelvic pathology. 2. Indeterminate mildly enlarged left para-aortic lymph node, possibly reactive. Attention on follow-up imaging recommended. Electronically Signed   By: Anner Crete M.D.   On: 12/16/2018 02:13   Dg Chest 2 View  Result Date: 12/15/2018 CLINICAL DATA:  Right chest pain and right upper quadrant abdominal pain. EXAM: CHEST - 2 VIEW COMPARISON:  06/01/2018 chest radiograph. FINDINGS: Stable cardiomediastinal silhouette with normal heart size. No  pneumothorax. No pleural effusion. Lungs appear clear, with no acute consolidative airspace disease and no pulmonary edema. IMPRESSION: No active cardiopulmonary disease. Electronically Signed   By: Ilona Sorrel M.D.   On: 12/15/2018 18:43   Ct Chest Wo Contrast  Result Date: 12/16/2018 CLINICAL DATA:  68 year old male with early satiety. Right lateral chest and right upper quadrant abdominal pain. EXAM: CT CHEST, ABDOMEN AND PELVIS WITHOUT CONTRAST TECHNIQUE: Multidetector CT imaging of the chest, abdomen and pelvis was performed following the standard protocol without IV contrast. COMPARISON:  Chest CT dated 09/25/2015 and abdominal ultrasound dated 12/16/2018 FINDINGS: Evaluation of this exam is limited in the absence of intravenous contrast. CT CHEST FINDINGS Cardiovascular: There is no cardiomegaly. Trace pericardial effusion. Mild coronary vascular calcification involving the LAD. Mild atherosclerotic calcification the aorta. The central pulmonary arteries are grossly unremarkable on this noncontrast CT. Mediastinum/Nodes: No hilar or mediastinal adenopathy. The esophagus and thyroid gland are grossly unremarkable. No mediastinal fluid collection. Lungs/Pleura: Minimal bibasilar dependent atelectatic changes. There is no focal consolidation,  pleural effusion, or pneumothorax. The central airways are patent. Musculoskeletal: Degenerative changes of the spine. No acute osseous pathology. CT ABDOMEN PELVIS FINDINGS No intra-abdominal free air or free fluid. Hepatobiliary: The liver is unremarkable. No intrahepatic biliary ductal dilatation. No calcified gallstone or pericholecystic fluid. Pancreas: Unremarkable. No pancreatic ductal dilatation or surrounding inflammatory changes. Spleen: Normal in size without focal abnormality. Adrenals/Urinary Tract: The adrenal glands are unremarkable. There is no hydronephrosis or nephrolithiasis on either side. The visualized ureters and urinary bladder appear unremarkable. Stomach/Bowel: Evaluation of the bowel is limited in the absence of oral contrast. There is moderate stool throughout the colon. There is no bowel obstruction or active inflammation. The appendix is normal. Vascular/Lymphatic: The abdominal aorta and IVC are unremarkable on this noncontrast CT. No portal venous gas. Mildly enlarged left para-aortic lymph node measures 16 mm in short axis of indeterminate etiology or clinical significance, possibly reactive. Attention on follow-up imaging recommended. Reproductive: The prostate and seminal vesicles are grossly unremarkable. No pelvic mass. Other: Small fat containing right inguinal hernia. No fluid collection or inflammation. A small fat containing umbilical hernia is also noted. Musculoskeletal: Degenerative changes of the spine. No acute osseous pathology. IMPRESSION: 1. No acute intrathoracic, abdominal, or pelvic pathology. 2. Indeterminate mildly enlarged left para-aortic lymph node, possibly reactive. Attention on follow-up imaging recommended. Electronically Signed   By: Anner Crete M.D.   On: 12/16/2018 02:13   US Abdomen Limited  Result Date: 12/16/2018 CLINICAL DATA:  68 year old male with right upper quadrant abdominal pain for 3-4 days. EXAM: ULTRASOUND ABDOMEN LIMITED RIGHT  UPPER QUADRANT COMPARISON:  Chest radiographs yesterday.  Chest CT 09/25/2015. FINDINGS: Gallbladder: No gallstones or wall thickening visualized. No sonographic Murphy sign noted by sonographer. Common bile duct: Diameter: 4-5 millimeters, normal. Liver: No focal lesion identified. Within normal limits in parenchymal echogenicity. Portal vein is patent on color Doppler imaging with normal direction of blood flow towards the liver. Other: Negative visible right renal upper pole. IMPRESSION: Normal right upper quadrant ultrasound. Electronically Signed   By: Genevie Ann M.D.   On: 12/16/2018 01:07    Procedures Procedures (including critical care time)  Medications Ordered in ED Medications  sodium chloride flush (NS) 0.9 % injection 3 mL (has no administration in time range)  sodium chloride flush (NS) 0.9 % injection 3 mL (has no administration in time range)  ondansetron (ZOFRAN-ODT) disintegrating tablet 4 mg (has no administration in time range)  metoCLOPramide (REGLAN) injection  10 mg (10 mg Intravenous Given 12/16/18 0027)  fentaNYL (SUBLIMAZE) injection 50 mcg (50 mcg Intravenous Given 12/16/18 0030)     Initial Impression / Assessment and Plan / ED Course  I have reviewed the triage vital signs and the nursing notes.  Pertinent labs & imaging results that were available during my care of the patient were reviewed by me and considered in my medical decision making (see chart for details).        68 year old male presents to the emergency department for evaluation of right upper quadrant abdominal pain which has been ongoing for many weeks.  It has been worse over the past 3 to 4 days.  Symptoms associated with nausea.  He has been more constipated, but had a large bowel movement recently.  Continues to pass flatus.  Tender mostly on exam to the upper and lateral right abdomen.  Laboratory evaluation and imaging today has been reassuring.  There is no evidence of emergent or infectious  process in the abdomen or pelvis.  Chest was also extensively imaged with no acute findings.  He has a stable troponin and EKG without acute changes.  Patient currently sitting up in bed eating a sandwich and drinking fluids.  I feel patient is appropriate for continued outpatient follow-up, especially in light of symptom chronicity.  Return precautions discussed and provided. Patient discharged in stable condition with no unaddressed concerns.   Final Clinical Impressions(s) / ED Diagnoses   Final diagnoses:  Right upper quadrant abdominal pain    ED Discharge Orders         Ordered    metoCLOPramide (REGLAN) 10 MG tablet  Every 6 hours     12/16/18 0248           Antonietta Breach, PA-C 12/16/18 [place], Ankit, MD 12/22/18 1513

## 2018-12-16 NOTE — ED Notes (Signed)
Pt tolerated PO fluids well, is now eating a meal.

## 2018-12-16 NOTE — ED Notes (Signed)
Patient transported to CT 

## 2018-12-16 NOTE — ED Notes (Signed)
Patient verbalizes understanding of discharge instructions. Opportunity for questioning and answers were provided. Armband removed by staff, pt discharged from ED ambulatory.   

## 2018-12-16 NOTE — Discharge Instructions (Signed)
Your blood tests and imaging in the emergency department today were reassuring.  It is unclear what is causing your ongoing abdominal pain.  We recommend Tylenol for pain control.  You may use Reglan as needed for nausea management.  Follow-up with your primary care doctor for further evaluation.  You may return for any new or concerning symptoms.

## 2018-12-21 DIAGNOSIS — K5900 Constipation, unspecified: Secondary | ICD-10-CM | POA: Diagnosis not present

## 2018-12-21 DIAGNOSIS — I10 Essential (primary) hypertension: Secondary | ICD-10-CM | POA: Diagnosis not present

## 2018-12-21 DIAGNOSIS — E1142 Type 2 diabetes mellitus with diabetic polyneuropathy: Secondary | ICD-10-CM | POA: Diagnosis not present

## 2018-12-21 DIAGNOSIS — E559 Vitamin D deficiency, unspecified: Secondary | ICD-10-CM | POA: Diagnosis not present

## 2018-12-21 DIAGNOSIS — R071 Chest pain on breathing: Secondary | ICD-10-CM | POA: Diagnosis not present

## 2019-01-20 DIAGNOSIS — I5032 Chronic diastolic (congestive) heart failure: Secondary | ICD-10-CM | POA: Diagnosis not present

## 2019-01-20 DIAGNOSIS — E1121 Type 2 diabetes mellitus with diabetic nephropathy: Secondary | ICD-10-CM | POA: Diagnosis not present

## 2019-01-20 DIAGNOSIS — H8113 Benign paroxysmal vertigo, bilateral: Secondary | ICD-10-CM | POA: Diagnosis not present

## 2019-01-20 DIAGNOSIS — Z6829 Body mass index (BMI) 29.0-29.9, adult: Secondary | ICD-10-CM | POA: Diagnosis not present

## 2019-01-20 DIAGNOSIS — Z125 Encounter for screening for malignant neoplasm of prostate: Secondary | ICD-10-CM | POA: Diagnosis not present

## 2019-01-20 DIAGNOSIS — K5904 Chronic idiopathic constipation: Secondary | ICD-10-CM | POA: Diagnosis not present

## 2019-01-20 DIAGNOSIS — E559 Vitamin D deficiency, unspecified: Secondary | ICD-10-CM | POA: Diagnosis not present

## 2019-01-20 DIAGNOSIS — Z0000 Encounter for general adult medical examination without abnormal findings: Secondary | ICD-10-CM | POA: Diagnosis not present

## 2019-01-20 DIAGNOSIS — I10 Essential (primary) hypertension: Secondary | ICD-10-CM | POA: Diagnosis not present

## 2019-03-03 DIAGNOSIS — N183 Chronic kidney disease, stage 3 unspecified: Secondary | ICD-10-CM | POA: Diagnosis not present

## 2019-03-03 DIAGNOSIS — K5904 Chronic idiopathic constipation: Secondary | ICD-10-CM | POA: Diagnosis not present

## 2019-03-03 DIAGNOSIS — I10 Essential (primary) hypertension: Secondary | ICD-10-CM | POA: Diagnosis not present

## 2019-03-03 DIAGNOSIS — E1121 Type 2 diabetes mellitus with diabetic nephropathy: Secondary | ICD-10-CM | POA: Diagnosis not present

## 2019-03-03 DIAGNOSIS — I5032 Chronic diastolic (congestive) heart failure: Secondary | ICD-10-CM | POA: Diagnosis not present

## 2019-04-30 IMAGING — CR DG CHEST 2V
2 series · 2 of 2 positions shown · non-contrast
Comparison: 05/28/2018

CLINICAL DATA: CHF

EXAM:
CHEST - 2 VIEW

[chest lat]
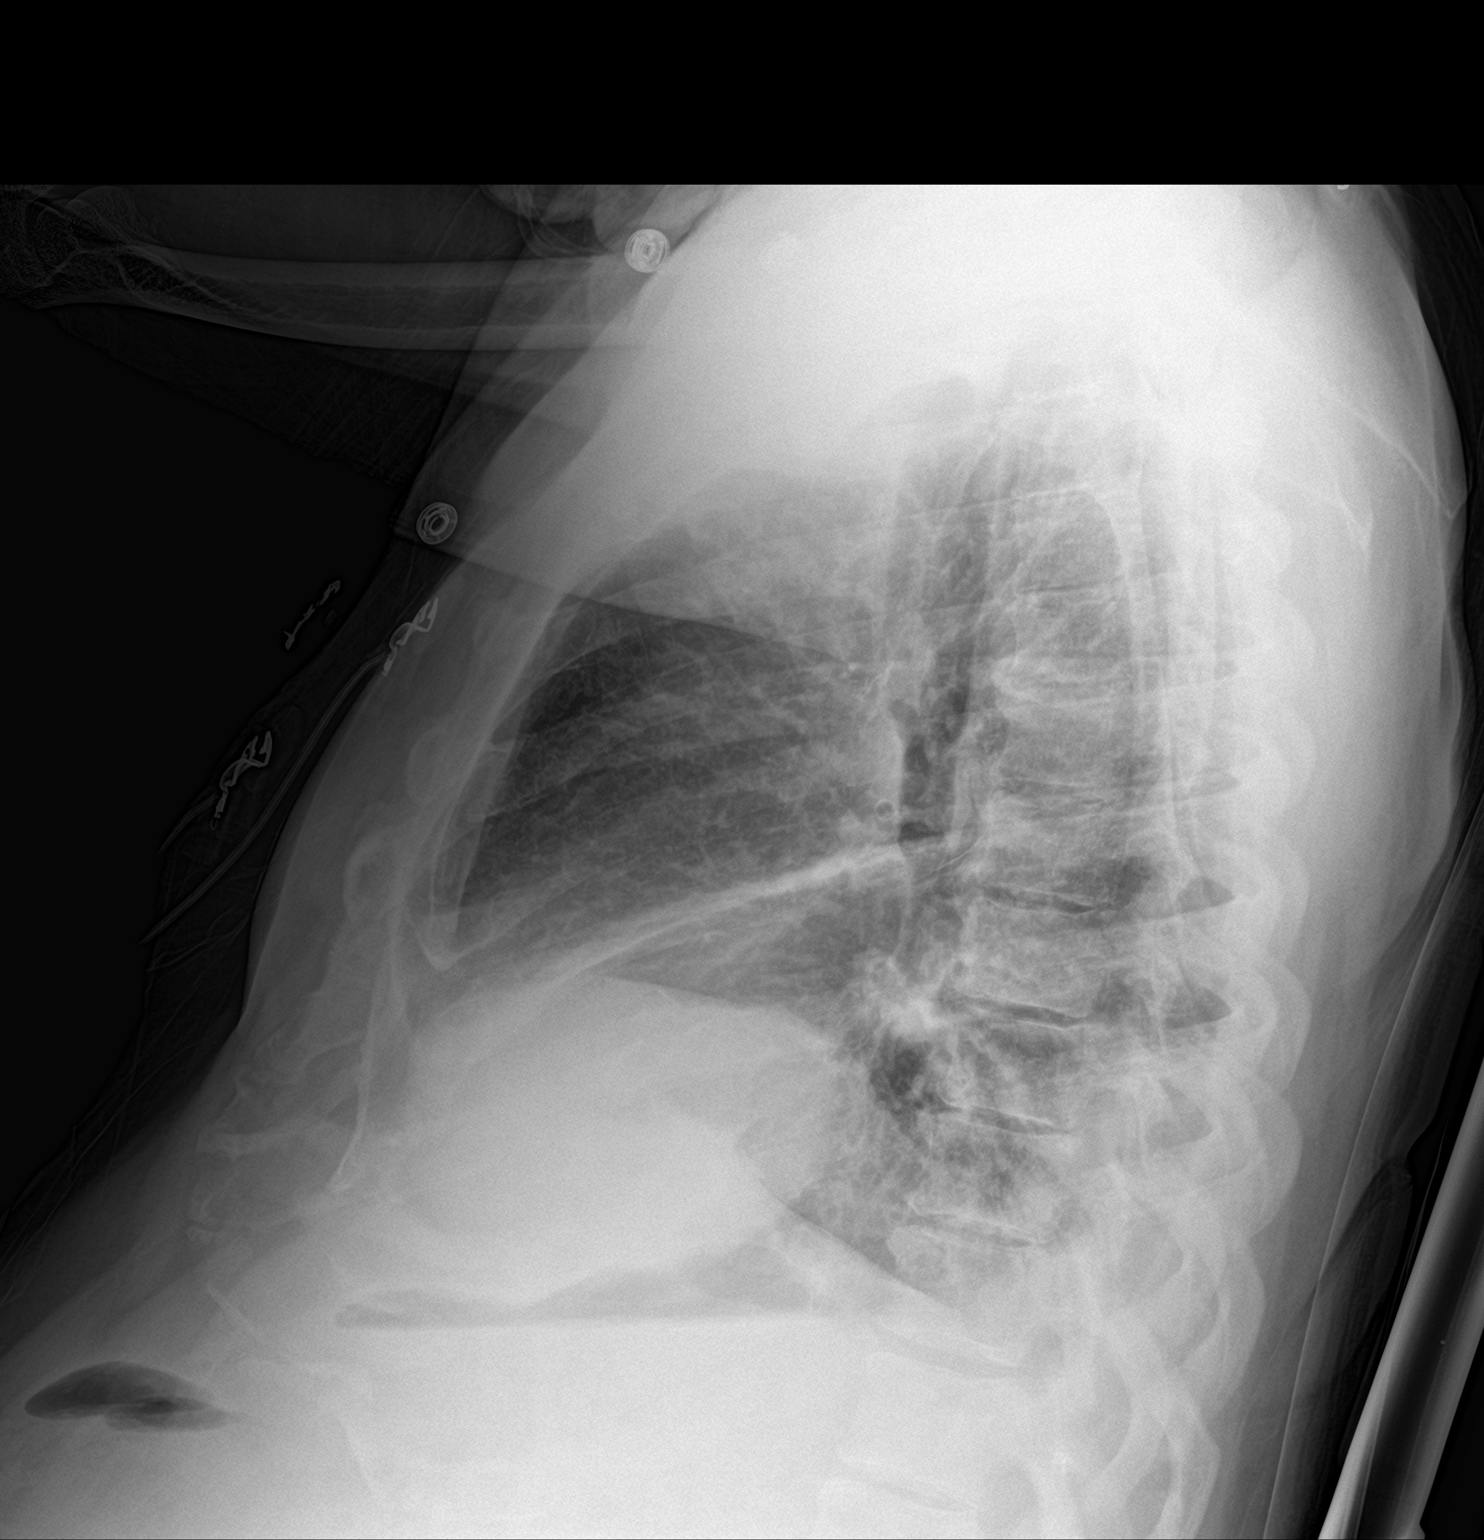

[chest ap]
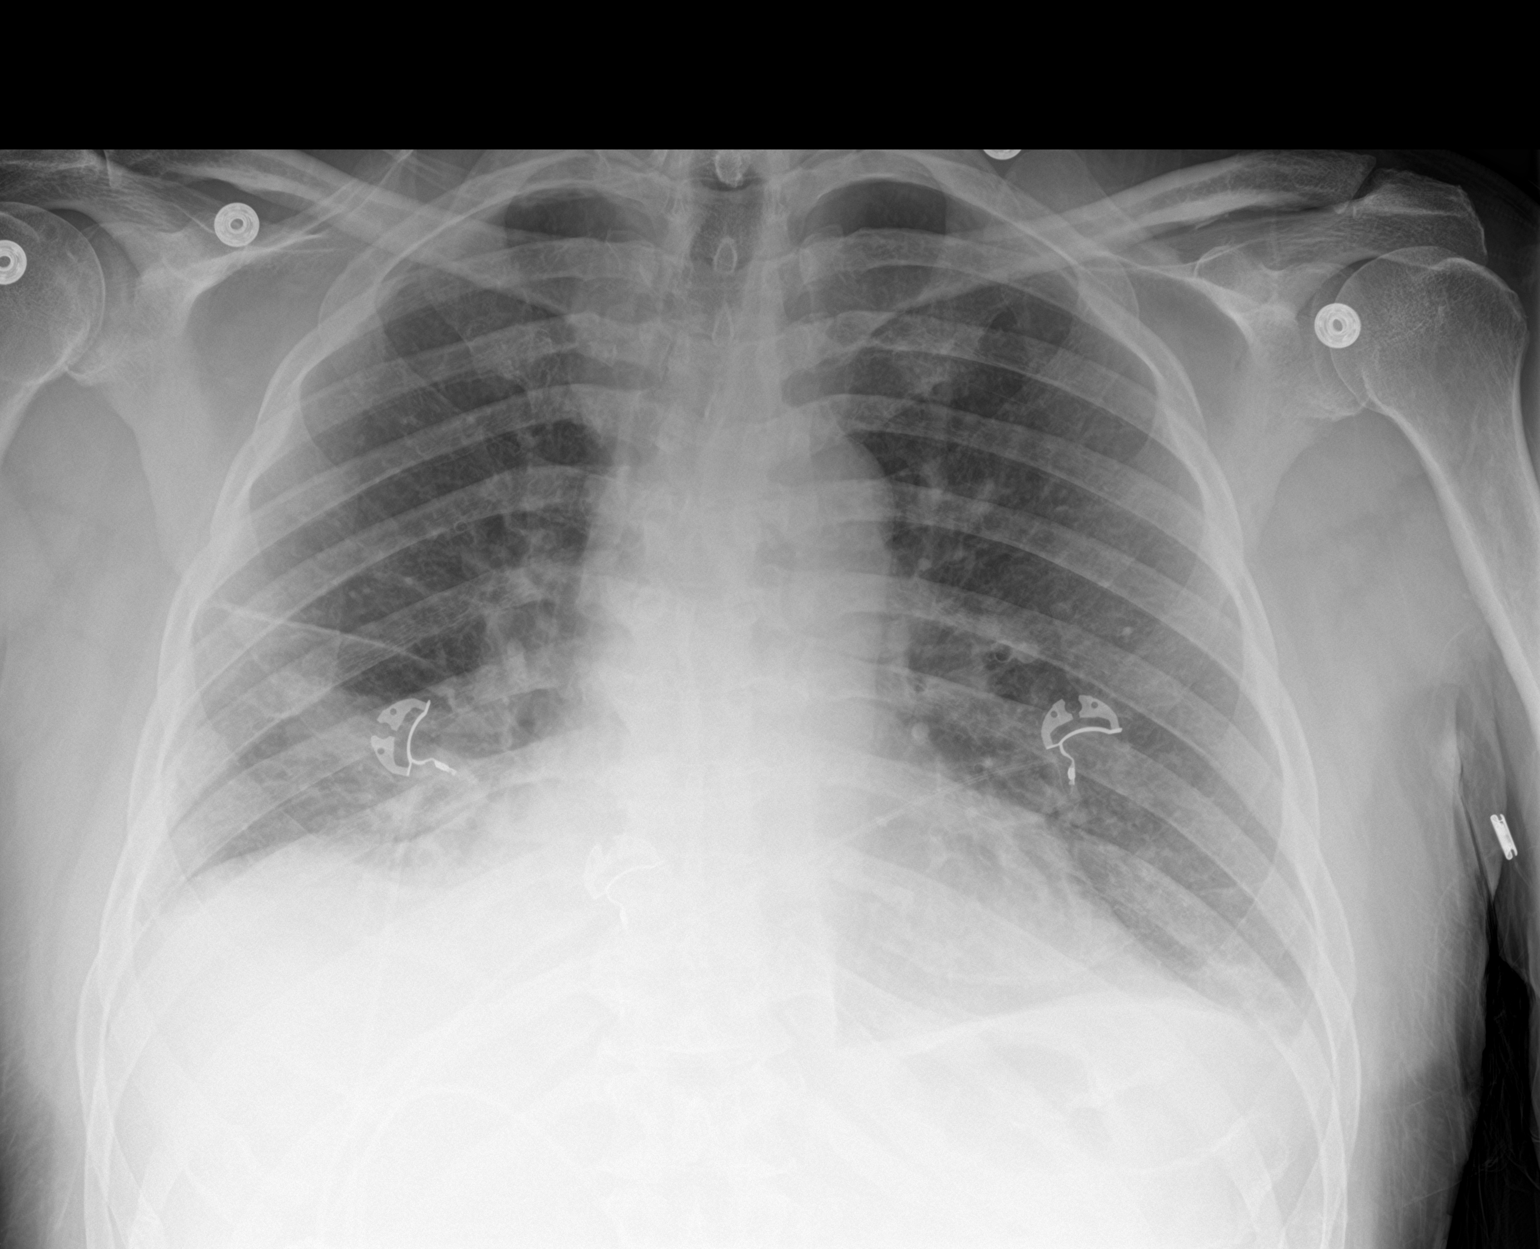

[2 of 2 positions shown; findings below may reference images not displayed]

FINDINGS: Bilateral mild interstitial thickening. Trace right pleural
effusion. No pneumothorax. Stable cardiomediastinal silhouette. No
aggressive osseous lesion.
IMPRESSION: Mild CHF.

## 2019-06-16 ENCOUNTER — Ambulatory Visit: Payer: Medicare Other | Attending: Family

## 2019-06-16 DIAGNOSIS — Z23 Encounter for immunization: Secondary | ICD-10-CM | POA: Insufficient documentation

## 2019-06-16 NOTE — Progress Notes (Signed)
° °  Covid-19 Vaccination Clinic  Name:  Melvin Mcclure    MRN: [id number] DOB: 1951/01/24  06/16/2019  [name] was observed post Covid-19 immunization for 15 minutes without incidence. He was provided with Vaccine Information Sheet and instruction to access the V-Safe system.   [name] was instructed to call 911 with any severe reactions post vaccine:  Difficulty breathing   Swelling of your face and throat   A fast heartbeat   A bad rash all over your body   Dizziness and weakness    Immunizations Administered    Name Date Dose VIS Date Route   Moderna COVID-19 Vaccine 06/16/2019  4:33 PM 0.5 mL 04/05/2019 Intramuscular   Manufacturer: Moderna   Lot: [id number]   [place]: [id number]

## 2019-07-19 ENCOUNTER — Ambulatory Visit: Payer: Medicare Other | Attending: Family

## 2019-07-19 DIAGNOSIS — Z23 Encounter for immunization: Secondary | ICD-10-CM

## 2019-07-19 NOTE — Progress Notes (Signed)
   Covid-19 Vaccination Clinic  Name:  Melvin Mcclure    MRN: [id number] DOB: June 10, 1950  07/19/2019  Mr. Nasser was observed post Covid-19 immunization for 15 minutes without incident. He was provided with Vaccine Information Sheet and instruction to access the V-Safe system.   Mr. Fecher was instructed to call 911 with any severe reactions post vaccine: Marland Kitchen Difficulty breathing  . Swelling of face and throat  . A fast heartbeat  . A bad rash all over body  . Dizziness and weakness   Immunizations Administered    Name Date Dose VIS Date Route   Moderna COVID-19 Vaccine 07/19/2019  3:34 PM 0.5 mL 04/05/2019 Intramuscular   Manufacturer: Moderna   Lot: [id number]   [place]: [id number]

## 2019-10-28 ENCOUNTER — Inpatient Hospital Stay (HOSPITAL_COMMUNITY)
Admit: 2019-10-28 | Discharge: 2019-11-06 | DRG: 193 | Disposition: A | Discharge status: Home / Self Care | Payer: Medicare Other | Source: Ambulatory Visit | Attending: Family Medicine | Admitting: Family Medicine

## 2019-10-28 ENCOUNTER — Ambulatory Visit (INDEPENDENT_AMBULATORY_CARE_PROVIDER_SITE_OTHER)
Admission: EM | Admit: 2019-10-28 | Discharge: 2019-10-28 | Disposition: A | Discharge status: Home / Self Care | Payer: Medicare Other | Source: Home / Self Care

## 2019-10-28 ENCOUNTER — Inpatient Hospital Stay (HOSPITAL_COMMUNITY): Payer: Medicare Other

## 2019-10-28 ENCOUNTER — Encounter (HOSPITAL_COMMUNITY): Payer: Self-pay | Admitting: Emergency Medicine

## 2019-10-28 ENCOUNTER — Encounter: Payer: Self-pay | Admitting: Physician Assistant

## 2019-10-28 ENCOUNTER — Other Ambulatory Visit: Payer: Self-pay

## 2019-10-28 ENCOUNTER — Emergency Department (HOSPITAL_COMMUNITY): Payer: Medicare Other

## 2019-10-28 DIAGNOSIS — E1140 Type 2 diabetes mellitus with diabetic neuropathy, unspecified: Secondary | ICD-10-CM | POA: Diagnosis present

## 2019-10-28 DIAGNOSIS — E119 Type 2 diabetes mellitus without complications: Secondary | ICD-10-CM | POA: Diagnosis present

## 2019-10-28 DIAGNOSIS — D631 Anemia in chronic kidney disease: Secondary | ICD-10-CM | POA: Diagnosis present

## 2019-10-28 DIAGNOSIS — Z79899 Other long term (current) drug therapy: Secondary | ICD-10-CM | POA: Diagnosis not present

## 2019-10-28 DIAGNOSIS — E785 Hyperlipidemia, unspecified: Secondary | ICD-10-CM | POA: Diagnosis present

## 2019-10-28 DIAGNOSIS — E872 Acidosis: Secondary | ICD-10-CM | POA: Diagnosis present

## 2019-10-28 DIAGNOSIS — Z20822 Contact with and (suspected) exposure to covid-19: Secondary | ICD-10-CM | POA: Diagnosis present

## 2019-10-28 DIAGNOSIS — I43 Cardiomyopathy in diseases classified elsewhere: Secondary | ICD-10-CM | POA: Diagnosis present

## 2019-10-28 DIAGNOSIS — N1832 Chronic kidney disease, stage 3b: Secondary | ICD-10-CM | POA: Diagnosis present

## 2019-10-28 DIAGNOSIS — K5900 Constipation, unspecified: Secondary | ICD-10-CM | POA: Diagnosis present

## 2019-10-28 DIAGNOSIS — J9601 Acute respiratory failure with hypoxia: Secondary | ICD-10-CM

## 2019-10-28 DIAGNOSIS — I10 Essential (primary) hypertension: Secondary | ICD-10-CM | POA: Diagnosis not present

## 2019-10-28 DIAGNOSIS — I361 Nonrheumatic tricuspid (valve) insufficiency: Secondary | ICD-10-CM | POA: Diagnosis not present

## 2019-10-28 DIAGNOSIS — Z794 Long term (current) use of insulin: Secondary | ICD-10-CM | POA: Diagnosis not present

## 2019-10-28 DIAGNOSIS — I5033 Acute on chronic diastolic (congestive) heart failure: Secondary | ICD-10-CM | POA: Diagnosis present

## 2019-10-28 DIAGNOSIS — Z833 Family history of diabetes mellitus: Secondary | ICD-10-CM | POA: Diagnosis not present

## 2019-10-28 DIAGNOSIS — I130 Hypertensive heart and chronic kidney disease with heart failure and stage 1 through stage 4 chronic kidney disease, or unspecified chronic kidney disease: Secondary | ICD-10-CM | POA: Diagnosis present

## 2019-10-28 DIAGNOSIS — I509 Heart failure, unspecified: Secondary | ICD-10-CM

## 2019-10-28 DIAGNOSIS — R42 Dizziness and giddiness: Secondary | ICD-10-CM | POA: Diagnosis present

## 2019-10-28 DIAGNOSIS — R7989 Other specified abnormal findings of blood chemistry: Secondary | ICD-10-CM | POA: Diagnosis not present

## 2019-10-28 DIAGNOSIS — Z8249 Family history of ischemic heart disease and other diseases of the circulatory system: Secondary | ICD-10-CM

## 2019-10-28 DIAGNOSIS — N184 Chronic kidney disease, stage 4 (severe): Secondary | ICD-10-CM

## 2019-10-28 DIAGNOSIS — E1122 Type 2 diabetes mellitus with diabetic chronic kidney disease: Secondary | ICD-10-CM | POA: Diagnosis not present

## 2019-10-28 DIAGNOSIS — R918 Other nonspecific abnormal finding of lung field: Secondary | ICD-10-CM

## 2019-10-28 DIAGNOSIS — E1169 Type 2 diabetes mellitus with other specified complication: Secondary | ICD-10-CM | POA: Diagnosis not present

## 2019-10-28 DIAGNOSIS — E875 Hyperkalemia: Secondary | ICD-10-CM | POA: Diagnosis present

## 2019-10-28 DIAGNOSIS — Z09 Encounter for follow-up examination after completed treatment for conditions other than malignant neoplasm: Secondary | ICD-10-CM

## 2019-10-28 DIAGNOSIS — J189 Pneumonia, unspecified organism: Secondary | ICD-10-CM | POA: Diagnosis present

## 2019-10-28 DIAGNOSIS — Z7982 Long term (current) use of aspirin: Secondary | ICD-10-CM

## 2019-10-28 DIAGNOSIS — N179 Acute kidney failure, unspecified: Secondary | ICD-10-CM | POA: Diagnosis present

## 2019-10-28 DIAGNOSIS — R509 Fever, unspecified: Secondary | ICD-10-CM

## 2019-10-28 DIAGNOSIS — I340 Nonrheumatic mitral (valve) insufficiency: Secondary | ICD-10-CM | POA: Diagnosis not present

## 2019-10-28 DIAGNOSIS — R0602 Shortness of breath: Secondary | ICD-10-CM

## 2019-10-28 LAB — LACTIC ACID, PLASMA: Lactic Acid, Venous: 0.8 mmol/L (ref 0.5–1.9)

## 2019-10-28 LAB — BASIC METABOLIC PANEL
Anion gap: 15 (ref 5–15)
BUN: 37 mg/dL — ABNORMAL HIGH (ref 8–23)
CO2: 19 mmol/L — ABNORMAL LOW (ref 22–32)
Calcium: 7.4 mg/dL — ABNORMAL LOW (ref 8.9–10.3)
Chloride: 109 mmol/L (ref 98–111)
Creatinine, Ser: 2.76 mg/dL — ABNORMAL HIGH (ref 0.61–1.24)
GFR calc Af Amer: 26 mL/min — ABNORMAL LOW (ref >60)
GFR calc non Af Amer: 22 mL/min — ABNORMAL LOW (ref >60)
Glucose, Bld: 150 mg/dL — ABNORMAL HIGH (ref 70–99)
Potassium: 4.8 mmol/L (ref 3.5–5.1)
Sodium: 143 mmol/L (ref 135–145)

## 2019-10-28 LAB — PROTIME-INR
INR: 1.1 (ref 0.8–1.2)
Prothrombin Time: 14.1 seconds (ref 11.4–15.2)

## 2019-10-28 LAB — CBC WITH DIFFERENTIAL/PLATELET
Abs Immature Granulocytes: 0.07 10*3/uL (ref 0.00–0.07)
Basophils Absolute: 0 10*3/uL (ref 0.0–0.1)
Basophils Relative: 0 %
Eosinophils Absolute: 0.1 10*3/uL (ref 0.0–0.5)
Eosinophils Relative: 1 %
HCT: 33.2 % — ABNORMAL LOW (ref 39.0–52.0)
Hemoglobin: 10.3 g/dL — ABNORMAL LOW (ref 13.0–17.0)
Immature Granulocytes: 1 %
Lymphocytes Relative: 15 %
Lymphs Abs: 1.7 10*3/uL (ref 0.7–4.0)
MCH: 27.3 pg (ref 26.0–34.0)
MCHC: 31 g/dL (ref 30.0–36.0)
MCV: 88.1 fL (ref 80.0–100.0)
Monocytes Absolute: 1.1 10*3/uL — ABNORMAL HIGH (ref 0.1–1.0)
Monocytes Relative: 10 %
Neutro Abs: 8.2 10*3/uL — ABNORMAL HIGH (ref 1.7–7.7)
Neutrophils Relative %: 73 %
Platelets: 232 10*3/uL (ref 150–400)
RBC: 3.77 MIL/uL — ABNORMAL LOW (ref 4.22–5.81)
RDW: 15 % (ref 11.5–15.5)
WBC: 11.2 10*3/uL — ABNORMAL HIGH (ref 4.0–10.5)
nRBC: 0.2 % (ref 0.0–0.2)

## 2019-10-28 LAB — TROPONIN I (HIGH SENSITIVITY)
Troponin I (High Sensitivity): 14 ng/L (ref <18)
Troponin I (High Sensitivity): 13 ng/L (ref <18)

## 2019-10-28 LAB — D-DIMER, QUANTITATIVE: D-Dimer, Quant: 1.1 ug/mL-FEU — ABNORMAL HIGH (ref 0.00–0.50)

## 2019-10-28 LAB — BRAIN NATRIURETIC PEPTIDE: B Natriuretic Peptide: 745.1 pg/mL — ABNORMAL HIGH (ref 0.0–100.0)

## 2019-10-28 LAB — CBG MONITORING, ED: Glucose-Capillary: 220 mg/dL — ABNORMAL HIGH (ref 70–99)

## 2019-10-28 LAB — SARS CORONAVIRUS 2 BY RT PCR (HOSPITAL ORDER, PERFORMED IN ~~LOC~~ HOSPITAL LAB): SARS Coronavirus 2: NEGATIVE

## 2019-10-28 MED ORDER — METOPROLOL SUCCINATE ER 25 MG PO TB24
25.0000 mg | ORAL_TABLET | Freq: Every day | ORAL | Status: DC
  Administered 2019-10-28 – 2019-11-06 (×10): 25 mg via ORAL
  Filled 2019-10-28 (×10): qty 1

## 2019-10-28 MED ORDER — ASPIRIN EC 81 MG PO TBEC
81.0000 mg | DELAYED_RELEASE_TABLET | Freq: Every day | ORAL | Status: DC
  Administered 2019-10-29 – 2019-11-06 (×9): 81 mg via ORAL
  Filled 2019-10-28 (×9): qty 1

## 2019-10-28 MED ORDER — HYDRALAZINE HCL 25 MG PO TABS: 50.0000 mg | ORAL_TABLET | Freq: Three times a day (TID) | ORAL | Status: DC

## 2019-10-28 MED ORDER — INSULIN ASPART 100 UNIT/ML ~~LOC~~ SOLN
0.0000 [IU] | Freq: Three times a day (TID) | SUBCUTANEOUS | Status: DC
  Administered 2019-10-29 – 2019-10-30 (×3): 2 [IU] via SUBCUTANEOUS
  Administered 2019-10-30 – 2019-10-31 (×2): 1 [IU] via SUBCUTANEOUS
  Administered 2019-10-31: 2 [IU] via SUBCUTANEOUS
  Administered 2019-11-01 (×2): 1 [IU] via SUBCUTANEOUS
  Administered 2019-11-02 (×2): 2 [IU] via SUBCUTANEOUS
  Administered 2019-11-03 – 2019-11-04 (×5): 1 [IU] via SUBCUTANEOUS
  Administered 2019-11-05: 2 [IU] via SUBCUTANEOUS
  Filled 2019-10-28: qty 0.09

## 2019-10-28 MED ORDER — MECLIZINE HCL 25 MG PO TABS
25.0000 mg | ORAL_TABLET | Freq: Three times a day (TID) | ORAL | Status: DC
  Administered 2019-10-28 – 2019-11-06 (×25): 25 mg via ORAL
  Filled 2019-10-28 (×26): qty 1

## 2019-10-28 MED ORDER — AMLODIPINE BESYLATE 10 MG PO TABS
10.0000 mg | ORAL_TABLET | Freq: Every day | ORAL | Status: DC
  Administered 2019-10-29 – 2019-11-06 (×9): 10 mg via ORAL
  Filled 2019-10-28 (×9): qty 1

## 2019-10-28 MED ORDER — HYDRALAZINE HCL 25 MG PO TABS
25.0000 mg | ORAL_TABLET | Freq: Three times a day (TID) | ORAL | Status: DC
  Administered 2019-10-28 – 2019-11-03 (×16): 25 mg via ORAL
  Filled 2019-10-28 (×17): qty 1

## 2019-10-28 MED ORDER — DOXYCYCLINE HYCLATE 100 MG PO TABS
100.0000 mg | ORAL_TABLET | Freq: Two times a day (BID) | ORAL | Status: DC
  Administered 2019-10-29 – 2019-11-03 (×10): 100 mg via ORAL
  Filled 2019-10-28 (×11): qty 1

## 2019-10-28 MED ORDER — SODIUM CHLORIDE 0.9 % IV SOLN
1.0000 g | Freq: Once | INTRAVENOUS | Status: AC
  Administered 2019-10-28: 1 g via INTRAVENOUS
  Filled 2019-10-28: qty 10

## 2019-10-28 MED ORDER — IPRATROPIUM-ALBUTEROL 0.5-2.5 (3) MG/3ML IN SOLN: 3.0000 mL | RESPIRATORY_TRACT | Status: DC | PRN

## 2019-10-28 MED ORDER — FUROSEMIDE 10 MG/ML IJ SOLN
40.0000 mg | Freq: Once | INTRAMUSCULAR | Status: AC
  Administered 2019-10-28: 40 mg via INTRAVENOUS
  Filled 2019-10-28: qty 4

## 2019-10-28 MED ORDER — SODIUM CHLORIDE 0.9 % IV SOLN
1.0000 g | INTRAVENOUS | Status: DC
  Administered 2019-10-29 – 2019-11-02 (×5): 1 g via INTRAVENOUS
  Filled 2019-10-28 (×4): qty 1
  Filled 2019-10-28: qty 10
  Filled 2019-10-28: qty 1

## 2019-10-28 MED ORDER — IPRATROPIUM-ALBUTEROL 0.5-2.5 (3) MG/3ML IN SOLN
3.0000 mL | Freq: Four times a day (QID) | RESPIRATORY_TRACT | Status: DC
  Administered 2019-10-28 – 2019-10-29 (×3): 3 mL via RESPIRATORY_TRACT
  Filled 2019-10-28 (×3): qty 3

## 2019-10-28 MED ORDER — SODIUM CHLORIDE 0.9 % IV SOLN
500.0000 mg | Freq: Once | INTRAVENOUS | Status: AC
  Administered 2019-10-28: 500 mg via INTRAVENOUS
  Filled 2019-10-28: qty 500

## 2019-10-28 NOTE — ED Provider Notes (Signed)
[facility] DEPT Provider Note   CSN: [id number] Arrival date & time: 10/28/19  1335     History Chief Complaint  Patient presents with  . Shortness of Breath    Melvin Mcclure is a 69 y.o. male.  He has a history of CHF diabetes hypertension.  Complaining of 2 or 3 days of increased shortness of breath dyspnea on exertion and orthopnea.  Low-grade fever.  No chest pain no abdominal pain.  Minimal cough.  Does not check weights but thinks his weight is down a little bit.  No swelling in the legs.  Went to urgent care today where his saturations were in the low 80s and was placed on oxygen and transferred here.  Does not normally require oxygen.  Non-smoker.  Has received his Covid vaccines  The history is provided by the patient.  Shortness of Breath Severity:  Moderate Onset quality:  Gradual Timing:  Intermittent Progression:  Worsening Chronicity:  Recurrent Context: activity   Relieved by:  Nothing Worsened by:  Activity and exertion Ineffective treatments:  Diuretics Associated symptoms: fever   Associated symptoms: no abdominal pain, no chest pain, no cough, no diaphoresis, no headaches, no hemoptysis, no rash, no sore throat, no sputum production, no syncope and no vomiting        Past Medical History:  Diagnosis Date  . CHF (congestive heart failure) ([place]) 05/28/2018   dx 05/28/18  . Diabetes mellitus without complication ([place])   . Hyperlipidemia   . Hypertension   . Perforating neurotrophic ulcer of foot [facility])     Patient Active Problem List   Diagnosis Date Noted  . Hypertensive cardiomyopathy, with heart failure ([place]) 06/15/2018  . Acute diastolic CHF (congestive heart failure) ([place]) 05/28/2018  . Type 2 diabetes mellitus with stage 3 chronic kidney disease ([place]) 05/28/2018  . CKD stage 4 due to type 2 diabetes mellitus ([place]) 05/28/2018  . CHF (congestive heart failure) ([place]) 05/28/2018    Past Surgical History:  Procedure  Laterality Date  . ACHILLES TENDON REPAIR    . CYST EXCISION         Family History  Problem Relation Age of Onset  . Diabetes Mellitus II Mother   . Hypertension Father     Social History   Tobacco Use  . Smoking status: Never Smoker  . Smokeless tobacco: Never Used  Vaping Use  . Vaping Use: Never used  Substance Use Topics  . Alcohol use: No    Alcohol/week: 0.0 standard drinks  . Drug use: No    Home Medications Prior to Admission medications   Medication Sig Start Date End Date Taking? Authorizing Provider  amLODipine (NORVASC) 10 MG tablet Take 10 mg by mouth daily.    [provider]  aspirin 81 MG tablet Take 81 mg by mouth daily.    [provider]  calcitRIOL (ROCALTROL) 0.25 MCG capsule Take 0.25 mcg by mouth 2 (two) times a week. 10/18/19   [provider]  CALCIUM PO Take 1 tablet by mouth daily.    [provider]  ferrous sulfate 325 (65 FE) MG tablet Take 325 mg by mouth daily with breakfast.    [provider]  gabapentin (NEURONTIN) 100 MG capsule Take 1 capsule (100 mg total) by mouth at bedtime. 06/04/18   Dessa Phi, DO  gabapentin (NEURONTIN) 300 MG capsule Take 300 mg by mouth 3 (three) times daily. 10/18/19   [provider]  hydrALAZINE (APRESOLINE) 100 MG tablet Take  100 mg by mouth 3 (three) times daily. 10/04/19   [provider]  hydrALAZINE (APRESOLINE) 50 MG tablet Take 1 tablet (50 mg total) by mouth every 8 (eight) hours. Patient taking differently: Take 100 mg by mouth every 8 (eight) hours.  06/04/18   Dessa Phi, DO  insulin aspart (NOVOLOG) 100 UNIT/ML injection Inject 4-8 Units into the skin daily as needed for high blood sugar. Per sliding scale    [provider]  insulin glargine (LANTUS) 100 UNIT/ML injection Inject 0.05 mLs (5 Units total) into the skin daily. Patient taking differently: Inject 5 Units into the skin daily as needed (sugar levels).  06/04/18    Dessa Phi, DO  lisinopril (ZESTRIL) 10 MG tablet Take 10 mg by mouth daily. 08/24/19   [provider]  meclizine (ANTIVERT) 25 MG tablet Take 25 mg by mouth 3 (three) times daily as needed. 10/18/19   [provider]  metoCLOPramide (REGLAN) 10 MG tablet Take 1 tablet (10 mg total) by mouth every 6 (six) hours. 12/16/18   Antonietta Breach, PA-C  metoprolol succinate (TOPROL-XL) 25 MG 24 hr tablet Take 25 mg by mouth daily. 10/18/19   [provider]  senna-docusate (SENOKOT-S) 8.6-50 MG tablet Take 1 tablet by mouth 2 (two) times daily.    [provider]  torsemide (DEMADEX) 20 MG tablet Take 1 tablet (20 mg total) by mouth daily. 06/05/18   Dessa Phi, DO  Vitamin D, Ergocalciferol, (DRISDOL) 1.25 MG (50000 UT) CAPS capsule Take 1 capsule (50,000 Units total) by mouth every 7 (seven) days. 06/10/18   Dessa Phi, DO    Allergies    Patient has no known allergies.  Review of Systems   Review of Systems  Constitutional: Positive for fever. Negative for diaphoresis.  HENT: Negative for sore throat.   Eyes: Negative for visual disturbance.  Respiratory: Positive for shortness of breath. Negative for cough, hemoptysis and sputum production.   Cardiovascular: Negative for chest pain and syncope.  Gastrointestinal: Negative for abdominal pain and vomiting.  Genitourinary: Negative for dysuria.  Musculoskeletal: Negative for gait problem.  Skin: Negative for rash.  Neurological: Negative for headaches.    Physical Exam Updated Vital Signs BP (!) 156/80   Pulse 78   Temp 99.2 F (37.3 C) (Oral)   Resp 20   SpO2 97%   Physical Exam Vitals and nursing note reviewed.  Constitutional:      Appearance: He is well-developed.  HENT:     Head: Normocephalic and atraumatic.  Eyes:     Conjunctiva/sclera: Conjunctivae normal.  Cardiovascular:     Rate and Rhythm: Normal rate and regular rhythm.     Heart sounds: No murmur heard.   Pulmonary:      Effort: Pulmonary effort is normal. No respiratory distress.     Breath sounds: Examination of the right-middle field reveals rhonchi. Examination of the right-lower field reveals rhonchi. Rhonchi present.  Abdominal:     Palpations: Abdomen is soft.     Tenderness: There is no abdominal tenderness.  Musculoskeletal:        General: Normal range of motion.     Cervical back: Neck supple.     Right lower leg: No tenderness. No edema.     Left lower leg: No tenderness. No edema.  Skin:    General: Skin is warm and dry.     Capillary Refill: Capillary refill takes less than 2 seconds.  Neurological:     General: No focal deficit present.  Mental Status: He is alert.     ED Results / Procedures / Treatments   Labs (all labs ordered are listed, but only abnormal results are displayed) Labs Reviewed  CBC WITH DIFFERENTIAL/PLATELET - Abnormal; Notable for the following components:      Result Value   WBC 11.2 (*)    RBC 3.77 (*)    Hemoglobin 10.3 (*)    HCT 33.2 (*)    Neutro Abs 8.2 (*)    Monocytes Absolute 1.1 (*)    All other components within normal limits  BRAIN NATRIURETIC PEPTIDE - Abnormal; Notable for the following components:   B Natriuretic Peptide 745.1 (*)    All other components within normal limits  BASIC METABOLIC PANEL - Abnormal; Notable for the following components:   CO2 19 (*)    Glucose, Bld 150 (*)    BUN 37 (*)    Creatinine, Ser 2.76 (*)    Calcium 7.4 (*)    GFR calc non Af Amer 22 (*)    GFR calc Af Amer 26 (*)    All other components within normal limits  D-DIMER, QUANTITATIVE (NOT AT [facility]) - Abnormal; Notable for the following components:   D-Dimer, Quant 1.10 (*)    All other components within normal limits  HEMOGLOBIN A1C - Abnormal; Notable for the following components:   Hgb A1c MFr Bld 7.1 (*)    All other components within normal limits  GLUCOSE, CAPILLARY - Abnormal; Notable for the following components:   Glucose-Capillary 125 (*)     All other components within normal limits  COMPREHENSIVE METABOLIC PANEL - Abnormal; Notable for the following components:   Glucose, Bld 145 (*)    BUN 34 (*)    Creatinine, Ser 2.97 (*)    Calcium 7.0 (*)    Albumin 3.4 (*)    GFR calc non Af Amer 21 (*)    GFR calc Af Amer 24 (*)    All other components within normal limits  CBC - Abnormal; Notable for the following components:   RBC 3.54 (*)    Hemoglobin 9.6 (*)    HCT 30.6 (*)    All other components within normal limits  GLUCOSE, CAPILLARY - Abnormal; Notable for the following components:   Glucose-Capillary 125 (*)    All other components within normal limits  CBG MONITORING, ED - Abnormal; Notable for the following components:   Glucose-Capillary 220 (*)    All other components within normal limits  CULTURE, BLOOD (ROUTINE X 2)  CULTURE, BLOOD (ROUTINE X 2)  SARS CORONAVIRUS 2 BY RT PCR (HOSPITAL ORDER, [facility])  PROTIME-INR  LACTIC ACID, PLASMA  HIV ANTIBODY (ROUTINE TESTING W REFLEX)  TROPONIN I (HIGH SENSITIVITY)  TROPONIN I (HIGH SENSITIVITY)    EKG EKG Interpretation  Date/Time:  Friday October 28 2019 13:59:14 EDT Ventricular Rate:  78 PR Interval:    QRS Duration: 126 QT Interval:  440 QTC Calculation: 502 R Axis:   -55 Text Interpretation: Sinus rhythm RBBB and LAFB Abnormal T, consider ischemia, lateral leads No significant change since prior 8/20 Confirmed by Aletta Edouard [id number]) on 10/28/2019 2:12:07 PM   Radiology CT HEAD WO CONTRAST  Result Date: 10/28/2019 CLINICAL DATA:  Dizziness, intermittent nausea and vomiting EXAM: CT HEAD WITHOUT CONTRAST TECHNIQUE: Contiguous axial images were obtained from the base of the skull through the vertex without intravenous contrast. COMPARISON:  03/07/2017 FINDINGS: Brain: Chronic small vessel ischemic changes within the periventricular white matter  most pronounced in the right frontal lobe. No acute infarct or hemorrhage. Lateral  ventricles and remaining midline structures are unremarkable. No acute extra-axial fluid collections. No mass effect. Vascular: No hyperdense vessel or unexpected calcification. Skull: Normal. Negative for fracture or focal lesion. Sinuses/Orbits: No acute finding. Other: None. IMPRESSION: 1. Stable head CT, no acute process. Electronically Signed   By: Randa Ngo M.D.   On: 10/28/2019 19:07   DG Chest Port 1 View  Result Date: 10/28/2019 CLINICAL DATA:  Shortness of breath. EXAM: PORTABLE CHEST 1 VIEW COMPARISON:  December 15, 2018 FINDINGS: Mild to moderate severity infiltrate is seen within the right lung base. There is a small right pleural effusion. No pneumothorax is identified. There is mild to moderate severity enlargement of the cardiac silhouette. The visualized skeletal structures are unremarkable. IMPRESSION: Mild to moderate severity right basilar infiltrate with a small right pleural effusion. Electronically Signed   By: Virgina Norfolk M.D.   On: 10/28/2019 15:57   VAS [place] LOWER EXTREMITY VENOUS (DVT)  Result Date: 10/29/2019  Lower Venous DVTStudy Indications: Elevated Ddimer.  Risk Factors: None identified. Comparison Study: No prior studies. Performing Technologist: Oliver Hum RVT  Examination Guidelines: A complete evaluation includes B-mode imaging, spectral Doppler, color Doppler, and power Doppler as needed of all accessible portions of each vessel. Bilateral testing is considered an integral part of a complete examination. Limited examinations for reoccurring indications may be performed as noted. The reflux portion of the exam is performed with the patient in reverse Trendelenburg.  +---------+---------------+---------+-----------+----------+--------------+ RIGHT    CompressibilityPhasicitySpontaneityPropertiesThrombus Aging +---------+---------------+---------+-----------+----------+--------------+ CFV      Full           Yes      Yes                                  +---------+---------------+---------+-----------+----------+--------------+ SFJ      Full                                                        +---------+---------------+---------+-----------+----------+--------------+ FV Prox  Full                                                        +---------+---------------+---------+-----------+----------+--------------+ FV Mid   Full                                                        +---------+---------------+---------+-----------+----------+--------------+ FV DistalFull                                                        +---------+---------------+---------+-----------+----------+--------------+ PFV      Full                                                        +---------+---------------+---------+-----------+----------+--------------+  POP      Full           Yes      Yes                                 +---------+---------------+---------+-----------+----------+--------------+ PTV      Full                                                        +---------+---------------+---------+-----------+----------+--------------+ PERO     Full                                                        +---------+---------------+---------+-----------+----------+--------------+   +---------+---------------+---------+-----------+----------+--------------+ LEFT     CompressibilityPhasicitySpontaneityPropertiesThrombus Aging +---------+---------------+---------+-----------+----------+--------------+ CFV      Full           Yes      Yes                                 +---------+---------------+---------+-----------+----------+--------------+ SFJ      Full                                                        +---------+---------------+---------+-----------+----------+--------------+ FV Prox  Full                                                         +---------+---------------+---------+-----------+----------+--------------+ FV Mid   Full                                                        +---------+---------------+---------+-----------+----------+--------------+ FV DistalFull                                                        +---------+---------------+---------+-----------+----------+--------------+ PFV      Full                                                        +---------+---------------+---------+-----------+----------+--------------+ POP      Full           Yes      Yes                                 +---------+---------------+---------+-----------+----------+--------------+  PTV      Full                                                        +---------+---------------+---------+-----------+----------+--------------+ PERO     Full                                                        +---------+---------------+---------+-----------+----------+--------------+     Summary: RIGHT: - There is no evidence of deep vein thrombosis in the lower extremity.  - No cystic structure found in the popliteal fossa.  LEFT: - There is no evidence of deep vein thrombosis in the lower extremity.  - No cystic structure found in the popliteal fossa.  *See table(s) above for measurements and observations.    Preliminary     Procedures Procedures (including critical care time)  Medications Ordered in ED Medications  heparin injection 5,000 Units (has no administration in time range)  sodium chloride flush (NS) 0.9 % injection 3 mL (3 mLs Intravenous Not Given 10/29/19 1101)  acetaminophen (TYLENOL) tablet 650 mg (has no administration in time range)    Or  acetaminophen (TYLENOL) suppository 650 mg (has no administration in time range)  HYDROcodone-acetaminophen (NORCO/VICODIN) 5-325 MG per tablet 1-2 tablet (has no administration in time range)  senna-docusate (Senokot-S) tablet 1 tablet (has no administration  in time range)  ondansetron (ZOFRAN) tablet 4 mg (has no administration in time range)    Or  ondansetron (ZOFRAN) injection 4 mg (has no administration in time range)  ipratropium-albuterol (DUONEB) 0.5-2.5 (3) MG/3ML nebulizer solution 3 mL (has no administration in time range)  amLODipine (NORVASC) tablet 10 mg (10 mg Oral Given 10/29/19 0938)  aspirin EC tablet 81 mg (81 mg Oral Given 10/29/19 0938)  meclizine (ANTIVERT) tablet 25 mg (25 mg Oral Given 10/29/19 0938)  metoprolol succinate (TOPROL-XL) 24 hr tablet 25 mg (25 mg Oral Given 10/29/19 0938)  hydrALAZINE (APRESOLINE) tablet 25 mg (25 mg Oral Given 10/29/19 0528)  insulin aspart (novoLOG) injection 0-9 Units (0 Units Subcutaneous Not Given 10/29/19 1003)  cefTRIAXone (ROCEPHIN) 1 g in sodium chloride 0.9 % 100 mL IVPB (has no administration in time range)  doxycycline (VIBRA-TABS) tablet 100 mg (100 mg Oral Given 10/29/19 0938)  furosemide (LASIX) injection 40 mg (40 mg Intravenous Given 10/29/19 0938)  ipratropium-albuterol (DUONEB) 0.5-2.5 (3) MG/3ML nebulizer solution 3 mL (has no administration in time range)  cefTRIAXone (ROCEPHIN) 1 g in sodium chloride 0.9 % 100 mL IVPB (0 g Intravenous Stopped 10/28/19 1644)  azithromycin (ZITHROMAX) 500 mg in sodium chloride 0.9 % 250 mL IVPB (0 mg Intravenous Stopped 10/29/19 0002)  furosemide (LASIX) injection 40 mg (40 mg Intravenous Given 10/28/19 1938)    ED Course  I have reviewed the triage vital signs and the nursing notes.  Pertinent labs & imaging results that were available during my care of the patient were reviewed by me and considered in my medical decision making (see chart for details).  Clinical Course as of Oct 28 1108  Fri Oct 28, 2019  1538 Chest x-ray interpreted by me as likely right lower lobe infiltrate.   [MB]  Clinical Course User Index [MB] Hayden Rasmussen, MD   MDM Rules/Calculators/A&P                         This patient complains of shortness of breath;  this involves an extensive number of treatment Options and is a complaint that carries with it a high risk of complications and Morbidity. The differential includes pneumonia, CHF, bronchitis, pericardial effusion, PE, Covid  I ordered, reviewed and interpreted labs, which included CBC with elevated white count, slightly lower hemoglobin, chemistries with low bicarb and worsened creatinine, stable delta troponin, BNP elevated I ordered medication IV antibiotics I ordered imaging studies which included chest x-ray and I independently    visualized and interpreted imaging which showed right lower lobe infiltrate Additional history obtained from patient's wife Previous records obtained and reviewed in epic I consulted [facility] hospitalist Dr. Horris Latino and discussed lab and imaging findings  Critical Interventions: None  After the interventions stated above, I reevaluated the patient and found patient to be hemodynamically stable but still requiring oxygen.  He will need to be admitted for further work-up and management of his hypoxia, pneumonia, congestive heart failure.  Melvin Mcclure was evaluated in Emergency Department on 10/28/2019 for the symptoms described in the history of present illness. He was evaluated in the context of the global COVID-19 pandemic, which necessitated consideration that the patient might be at risk for infection with the SARS-CoV-2 virus that causes COVID-19. Institutional protocols and algorithms that pertain to the evaluation of patients at risk for COVID-19 are in a state of rapid change based on information released by regulatory bodies including the CDC and federal and state organizations. These policies and algorithms were followed during the patient's care in the ED.  Final Clinical Impression(s) / ED Diagnoses Final diagnoses:  Community acquired pneumonia of right lung, unspecified part of lung  Acute on chronic congestive heart failure, unspecified heart failure  type [facility])    Rx / DC Orders ED Discharge Orders    None       Hayden Rasmussen, MD 10/29/19 1113

## 2019-10-28 NOTE — ED Provider Notes (Signed)
[name]    CSN: [id number] Arrival date & time: 10/28/19  1229      History   Chief Complaint Chief Complaint  Patient presents with  . Shortness of Breath    HPI Melvin Mcclure is a 69 y.o. male.   69 year old male with history of CHF, DM, HLD, HTN comes in for shortness of breath, fever.  At triage, O2 sat 83%, and was put on oxygen.  Wife states patient has been dropping to 70s at night.  Patient denies leg swelling, chest pain, orthopnea.  No history of home O2 use.     Past Medical History:  Diagnosis Date  . CHF (congestive heart failure) ([place]) 05/28/2018   dx 05/28/18  . Diabetes mellitus without complication ([place])   . Hyperlipidemia   . Hypertension   . Perforating neurotrophic ulcer of foot [facility])     Patient Active Problem List   Diagnosis Date Noted  . Hypertensive cardiomyopathy, with heart failure ([place]) 06/15/2018  . Acute diastolic CHF (congestive heart failure) ([place]) 05/28/2018  . Type 2 diabetes mellitus with stage 3 chronic kidney disease ([place]) 05/28/2018  . CKD stage 4 due to type 2 diabetes mellitus ([place]) 05/28/2018  . CHF (congestive heart failure) ([place]) 05/28/2018    Past Surgical History:  Procedure Laterality Date  . ACHILLES TENDON REPAIR    . CYST EXCISION         Home Medications    Prior to Admission medications   Medication Sig Start Date End Date Taking? Authorizing Provider  amLODipine (NORVASC) 10 MG tablet Take 10 mg by mouth daily.    [provider]  aspirin 81 MG tablet Take 81 mg by mouth daily.    [provider]  CALCIUM PO Take 1 tablet by mouth daily.    [provider]  ferrous sulfate 325 (65 FE) MG tablet Take 325 mg by mouth daily with breakfast.    [provider]  gabapentin (NEURONTIN) 100 MG capsule Take 1 capsule (100 mg total) by mouth at bedtime. 06/04/18   Dessa Phi, DO  hydrALAZINE (APRESOLINE) 50 MG tablet Take 1 tablet (50 mg total) by mouth every  8 (eight) hours. Patient taking differently: Take 100 mg by mouth every 8 (eight) hours.  06/04/18   Dessa Phi, DO  insulin aspart (NOVOLOG) 100 UNIT/ML injection Inject 4-8 Units into the skin daily as needed for high blood sugar. Per sliding scale    [provider]  insulin glargine (LANTUS) 100 UNIT/ML injection Inject 0.05 mLs (5 Units total) into the skin daily. Patient taking differently: Inject 5 Units into the skin daily as needed (sugar levels).  06/04/18   Dessa Phi, DO  metoCLOPramide (REGLAN) 10 MG tablet Take 1 tablet (10 mg total) by mouth every 6 (six) hours. 12/16/18   Antonietta Breach, PA-C  senna-docusate (SENOKOT-S) 8.6-50 MG tablet Take 1 tablet by mouth 2 (two) times daily.    [provider]  torsemide (DEMADEX) 20 MG tablet Take 1 tablet (20 mg total) by mouth daily. 06/05/18   Dessa Phi, DO  Vitamin D, Ergocalciferol, (DRISDOL) 1.25 MG (50000 UT) CAPS capsule Take 1 capsule (50,000 Units total) by mouth every 7 (seven) days. 06/10/18   Dessa Phi, DO    Family History Family History  Problem Relation Age of Onset  . Diabetes Mellitus II Mother   . Hypertension Father     Social History Social History   Tobacco Use  . Smoking status: Never Smoker  .  Smokeless tobacco: Never Used  Vaping Use  . Vaping Use: Never used  Substance Use Topics  . Alcohol use: No    Alcohol/week: 0.0 standard drinks  . Drug use: No     Allergies   Patient has no known allergies.   Review of Systems Review of Systems  Reason unable to perform ROS: See HPI as above.     Physical Exam Triage Vital Signs ED Triage Vitals [10/28/19 1246]  Enc Vitals Group     BP (!) 159/75     Pulse Rate 82     Resp (!) 22     Temp 98.8 F (37.1 C)     Temp Source Oral     SpO2 (!) 83 %     Weight      Height      Head Circumference      Peak Flow      Pain Score      Pain Loc      Pain Edu?      Excl. in [place]?    No data found.  Updated Vital  Signs BP (!) 159/75 (BP Location: Right Arm)   Pulse 82   Temp 98.8 F (37.1 C) (Oral)   Resp (!) 22   SpO2 (!) 83%   Visual Acuity Right Eye Distance:   Left Eye Distance:   Bilateral Distance:    Right Eye Near:   Left Eye Near:    Bilateral Near:     Physical Exam Constitutional:      General: He is not in acute distress.    Appearance: Normal appearance. He is well-developed. He is not toxic-appearing or diaphoretic.  HENT:     Head: Normocephalic and atraumatic.  Eyes:     Conjunctiva/sclera: Conjunctivae normal.     Pupils: Pupils are equal, round, and reactive to light.  Pulmonary:     Effort: Pulmonary effort is normal. No respiratory distress.     Comments: Speaking in full sentences without difficulty. No accessory muscle use. Right lower field crackles. Musculoskeletal:     Cervical back: Normal range of motion and neck supple.  Skin:    General: Skin is warm and dry.  Neurological:     Mental Status: He is alert and oriented to person, place, and time.      UC Treatments / Results  Labs (all labs ordered are listed, but only abnormal results are displayed) Labs Reviewed - No data to display  EKG   Radiology No results found.  Procedures Procedures (including critical care time)  Medications Ordered in UC Medications - No data to display  Initial Impression / Assessment and Plan / UC Course  I have reviewed the triage vital signs and the nursing notes.  Pertinent labs & imaging results that were available during my care of the patient were reviewed by me and considered in my medical decision making (see chart for details).    69 year old male with history of CHF, DM, HLD, HTN comes in for shortness of breath, fever.  At triage, O2 sat 83%, and was put on oxygen.  Wife states patient has been dropping to 70s at night.  Patient denies leg swelling, chest pain, orthopnea.  No history of home O2 use.  BP 159/75 HR 82 RR 22 temp 98.8, O2 sat  83% Constitutional: Afebrile, nontoxic in appearance.  No accessory muscle use, acute distress. Heart: RRR Lungs: Patient speaking in full sentences without difficulty.  Right lower field crackles.  No  wheezing. Neuro: Alert and oriented x4  Given hypoxia with new oxygen demand, will discharge in stable condition to the ED for further evaluation.  Given patient without any acute respiratory distress despite hypoxia, feel stable enough for wife to drive to ED versus EMS transport.  Final Clinical Impressions(s) / UC Diagnoses   Final diagnoses:  Acute respiratory failure with hypoxia (HCC)  Shortness of breath  Fever, unspecified    ED Prescriptions    None     PDMP not reviewed this encounter.   Ok Edwards, PA-C 10/28/19 1301

## 2019-10-28 NOTE — ED Triage Notes (Signed)
Patient presents today with shortness of breath, wheezing, low grade fever for the last two days with no relief. Patient states the symptoms get worse at night. Patient had a Negative Rapid Covid test today and has had COVID immunization.

## 2019-10-28 NOTE — H&P (Signed)
History and Physical  Melvin Mcclure ZDG:[id number] DOB: 1951/04/07 DOA: 10/28/2019   Patient coming from: Home & is able to ambulate independently   Chief Complaint: Shortness of breath  HPI: Melvin Mcclure is a 69 y.o. male with medical history significant for diastolic HF, hypertension, diabetes mellitus type 2, CKD stage IV, vertigo, presents to the ED complaining of shortness of breath, PND, dizziness and generalized weakness for the past few days.  Patient presents with his wife, who provided most of the history.  Roughly about 2 to 3 days ago patient progressively got short of breath, denied any chest pain, any significant coughing, reported shortness of breath worse at nighttime unable to sleep, wife noted some low-grade fever, denied any abdominal pain.  Patient does report history of vertigo with dizziness and occasional nausea/vomiting.  Patient also complained of intermittent diarrhea/constipation.  Patient denies any recent sick contacts, travel history.has received his Covid immunization.  Patient reports compliance to his medication, quit smoking over 20 years ago, drinks alcohol occasionally.  Due to worsening symptoms, patient presented to the ED.    ED Course: In the ED, patient noted to be hypoxic, desaturating to the 80s requiring about 3 L of oxygen to stay above 90%, tachypneic, BP somewhat elevated.  Labs reviewed creatinine around baseline, mild acidosis, leukocytosis, BNP elevated, troponin negative, EKG with some T wave inversions in leads, unchanged from previous, chest x-ray showed possible right lobe infiltrate.  Patient started on ceftriaxone, azithromycin by EDP.  [facility] hospitalist consulted for admission and further management.   Review of Systems: Review of systems are otherwise negative   Past Medical History:  Diagnosis Date  . CHF (congestive heart failure) ([place]) 05/28/2018   dx 05/28/18  . Diabetes mellitus without complication ([place])   . Hyperlipidemia   .  Hypertension   . Perforating neurotrophic ulcer of foot ([place])    Past Surgical History:  Procedure Laterality Date  . ACHILLES TENDON REPAIR    . CYST EXCISION      Social History:  reports that he has never smoked. He has never used smokeless tobacco. He reports that he does not drink alcohol and does not use drugs.   No Known Allergies  Family History  Problem Relation Age of Onset  . Diabetes Mellitus II Mother   . Hypertension Father       Prior to Admission medications   Medication Sig Start Date End Date Taking? Authorizing Provider  amLODipine (NORVASC) 10 MG tablet Take 10 mg by mouth daily.    [provider]  aspirin 81 MG tablet Take 81 mg by mouth daily.    [provider]  calcitRIOL (ROCALTROL) 0.25 MCG capsule Take 0.25 mcg by mouth 2 (two) times a week. 10/18/19   [provider]  CALCIUM PO Take 1 tablet by mouth daily.    [provider]  ferrous sulfate 325 (65 FE) MG tablet Take 325 mg by mouth daily with breakfast.    [provider]  gabapentin (NEURONTIN) 100 MG capsule Take 1 capsule (100 mg total) by mouth at bedtime. 06/04/18   Dessa Phi, DO  gabapentin (NEURONTIN) 300 MG capsule Take 300 mg by mouth 3 (three) times daily. 10/18/19   [provider]  hydrALAZINE (APRESOLINE) 100 MG tablet Take 100 mg by mouth 3 (three) times daily. 10/04/19   [provider]  hydrALAZINE (APRESOLINE) 50 MG tablet Take 1 tablet (50 mg total) by mouth every 8 (eight) hours. Patient taking differently: Take 100  mg by mouth every 8 (eight) hours.  06/04/18   Dessa Phi, DO  insulin aspart (NOVOLOG) 100 UNIT/ML injection Inject 4-8 Units into the skin daily as needed for high blood sugar. Per sliding scale    [provider]  insulin glargine (LANTUS) 100 UNIT/ML injection Inject 0.05 mLs (5 Units total) into the skin daily. Patient taking differently: Inject 5 Units into the skin daily as needed (sugar  levels).  06/04/18   Dessa Phi, DO  lisinopril (ZESTRIL) 10 MG tablet Take 10 mg by mouth daily. 08/24/19   [provider]  meclizine (ANTIVERT) 25 MG tablet Take 25 mg by mouth 3 (three) times daily as needed. 10/18/19   [provider]  metoCLOPramide (REGLAN) 10 MG tablet Take 1 tablet (10 mg total) by mouth every 6 (six) hours. 12/16/18   Antonietta Breach, PA-C  metoprolol succinate (TOPROL-XL) 25 MG 24 hr tablet Take 25 mg by mouth daily. 10/18/19   [provider]  senna-docusate (SENOKOT-S) 8.6-50 MG tablet Take 1 tablet by mouth 2 (two) times daily.    [provider]  torsemide (DEMADEX) 20 MG tablet Take 1 tablet (20 mg total) by mouth daily. 06/05/18   Dessa Phi, DO  Vitamin D, Ergocalciferol, (DRISDOL) 1.25 MG (50000 UT) CAPS capsule Take 1 capsule (50,000 Units total) by mouth every 7 (seven) days. 06/10/18   Dessa Phi, DO    Physical Exam: BP (!) 157/94   Pulse 81   Temp 99.2 F (37.3 C) (Oral)   Resp 16   SpO2 94%   General: Mild distress, tachypneic, acutely ill-appearing Eyes: Normal ENT: Normal Neck: Supple Cardiovascular: S1, S2 present Respiratory: Bibasilar crackles noted with some rhonchi Abdomen: Soft, nontender, protuberant, bowel sounds present Skin: Normal on exposed areas Musculoskeletal: No pedal edema bilaterally Psychiatric: Normal mood Neurologic: No obvious focal neurologic deficits noted          Labs on Admission:  Basic Metabolic Panel: Recent Labs  Lab 10/28/19 1527  NA 143  K 4.8  CL 109  CO2 19*  GLUCOSE 150*  BUN 37*  CREATININE 2.76*  CALCIUM 7.4*   Liver Function Tests: No results for input(s): AST, ALT, ALKPHOS, BILITOT, PROT, ALBUMIN in the last 168 hours. No results for input(s): LIPASE, AMYLASE in the last 168 hours. No results for input(s): AMMONIA in the last 168 hours. CBC: Recent Labs  Lab 10/28/19 1527  WBC 11.2*  NEUTROABS 8.2*  HGB 10.3*  HCT 33.2*  MCV 88.1  PLT 232     Cardiac Enzymes: No results for input(s): CKTOTAL, CKMB, CKMBINDEX, TROPONINI in the last 168 hours.  BNP (last 3 results) Recent Labs    10/28/19 1527  BNP 745.1*    ProBNP (last 3 results) No results for input(s): PROBNP in the last 8760 hours.  CBG: No results for input(s): GLUCAP in the last 168 hours.  Radiological Exams on Admission: DG Chest Port 1 View  Result Date: 10/28/2019 CLINICAL DATA:  Shortness of breath. EXAM: PORTABLE CHEST 1 VIEW COMPARISON:  December 15, 2018 FINDINGS: Mild to moderate severity infiltrate is seen within the right lung base. There is a small right pleural effusion. No pneumothorax is identified. There is mild to moderate severity enlargement of the cardiac silhouette. The visualized skeletal structures are unremarkable. IMPRESSION: Mild to moderate severity right basilar infiltrate with a small right pleural effusion. Electronically Signed   By: Virgina Norfolk M.D.   On: 10/28/2019 15:57    EKG: Independently reviewed.  Some T wave inversion noted in inferior leads, unchanged from previous EKG in 2020  Assessment/Plan Present on Admission: . CAP (community acquired pneumonia) . Type 2 diabetes mellitus with stage 3 chronic kidney disease (HCC)  Principal Problem:   CAP (community acquired pneumonia) Active Problems:   Type 2 diabetes mellitus with stage 3 chronic kidney disease (HCC)   CHF (congestive heart failure) (HCC)   Acute hypoxic respiratory failure Acute on chronic diastolic HF SOB, tachypnea, desaturated to the 80s on room air, likely ?sacral edema on admission BNP elevated-745.1 Troponin negative, EKG with some T wave inversion in inferior leads, unchanged from previous in 2020 Chest x-ray showed right lobe infiltrates Echo in 2020 showed preserved EF, will repeat We will give 1 dose of Lasix 40 mg IV and reassess in a.m. due to CKD Hold home torsemide, lisinopril for now Continue supplemental oxygen as needed, plan to  wean off Strict I's and O's, daily weights Telemetry  Possible community-acquired pneumonia Chest x-ray as above D-dimer elevated at 1.1, low suspicion for PE (will order Doppler of bilateral lower extremity) BC x2 pending Continue ceftriaxone, will switch azithromycin to doxycycline due to prolonged QTC Duo nebs scheduled, as needed, incentive spirometry Supplemental oxygen as needed  CKD stage IV/metabolic acidosis Creatinine around baseline, 2.7 Plan to diurese in the setting of HF, will monitor closely Hold home lisinopril for now May involve nephrology in AM as planning to diurese Daily BMP, avoid nephrotoxics  Hypertension BP somewhat uncontrolled Continue home hydralazine, amlodipine, hold lisinopril (med rec not yet done) Monitor closely  Type 2 diabetes mellitus A1c pending SSI, Accu-Cheks, hypoglycemic protocol  History of vertigo Reports associated dizziness, intermittent nausea/vomiting CT head pending PT/OT-apparently has tried Epley's maneuver with PT without any success Scheduled to see ENT I believe Dr. Redmond Baseman next week, may contact him while patient is here Fall precautions  Prolonged QTC Avoid QTC prolonging medications Frequent EKG      DVT prophylaxis: Heparin  Code Status: Full  Family Communication: Discussed extensively with wife at bedside  Disposition Plan: Likely home  Consults called: None  Admission status: Inpatient    Alma Friendly MD [facility]  If 7PM-7AM, please contact night-coverage [email or web address]   10/28/2019, 5:59 PM

## 2019-10-28 NOTE — Discharge Instructions (Signed)
69 year old male with history of CHF, DM, HLD, HTN comes in for shortness of breath, fever.  At triage, O2 sat 83%, and was put on oxygen.  Wife states patient has been dropping to 70s at night.  Patient denies leg swelling, chest pain, orthopnea.  No history of home O2 use.  BP 159/75 HR 82 RR 22 temp 98.8, O2 sat 83% Constitutional: Afebrile, nontoxic in appearance.  No accessory muscle use, acute distress. Heart: RRR Lungs: Patient speaking in full sentences without difficulty.  Right lower field crackles.  No wheezing. Neuro: Alert and oriented x4  Given hypoxia with new oxygen demand, will discharge in stable condition to the ED for further evaluation.  Given patient without any acute respiratory distress despite hypoxia, feel stable enough for wife to drive to ED versus EMS transport.

## 2019-10-28 NOTE — ED Triage Notes (Signed)
Patient presents with SOB which he has had the last 2 days off and on. He originally went to urgent and was then sent to [place]. HX: CHF

## 2019-10-29 ENCOUNTER — Inpatient Hospital Stay (HOSPITAL_COMMUNITY): Payer: Medicare Other

## 2019-10-29 DIAGNOSIS — I340 Nonrheumatic mitral (valve) insufficiency: Secondary | ICD-10-CM

## 2019-10-29 DIAGNOSIS — R7989 Other specified abnormal findings of blood chemistry: Secondary | ICD-10-CM

## 2019-10-29 DIAGNOSIS — E1169 Type 2 diabetes mellitus with other specified complication: Secondary | ICD-10-CM

## 2019-10-29 DIAGNOSIS — I361 Nonrheumatic tricuspid (valve) insufficiency: Secondary | ICD-10-CM

## 2019-10-29 LAB — CBC
HCT: 30.6 % — ABNORMAL LOW (ref 39.0–52.0)
Hemoglobin: 9.6 g/dL — ABNORMAL LOW (ref 13.0–17.0)
MCH: 27.1 pg (ref 26.0–34.0)
MCHC: 31.4 g/dL (ref 30.0–36.0)
MCV: 86.4 fL (ref 80.0–100.0)
Platelets: 202 10*3/uL (ref 150–400)
RBC: 3.54 MIL/uL — ABNORMAL LOW (ref 4.22–5.81)
RDW: 14.8 % (ref 11.5–15.5)
WBC: 10.1 10*3/uL (ref 4.0–10.5)
nRBC: 0 % (ref 0.0–0.2)

## 2019-10-29 LAB — COMPREHENSIVE METABOLIC PANEL
ALT: 18 U/L (ref 0–44)
AST: 16 U/L (ref 15–41)
Albumin: 3.4 g/dL — ABNORMAL LOW (ref 3.5–5.0)
Alkaline Phosphatase: 66 U/L (ref 38–126)
Anion gap: 10 (ref 5–15)
BUN: 34 mg/dL — ABNORMAL HIGH (ref 8–23)
CO2: 23 mmol/L (ref 22–32)
Calcium: 7 mg/dL — ABNORMAL LOW (ref 8.9–10.3)
Chloride: 108 mmol/L (ref 98–111)
Creatinine, Ser: 2.97 mg/dL — ABNORMAL HIGH (ref 0.61–1.24)
GFR calc Af Amer: 24 mL/min — ABNORMAL LOW (ref >60)
GFR calc non Af Amer: 21 mL/min — ABNORMAL LOW (ref >60)
Glucose, Bld: 145 mg/dL — ABNORMAL HIGH (ref 70–99)
Potassium: 4.6 mmol/L (ref 3.5–5.1)
Sodium: 141 mmol/L (ref 135–145)
Total Bilirubin: 0.9 mg/dL (ref 0.3–1.2)
Total Protein: 7.6 g/dL (ref 6.5–8.1)

## 2019-10-29 LAB — GLUCOSE, CAPILLARY
Glucose-Capillary: 125 mg/dL — ABNORMAL HIGH (ref 70–99)
Glucose-Capillary: 125 mg/dL — ABNORMAL HIGH (ref 70–99)
Glucose-Capillary: 174 mg/dL — ABNORMAL HIGH (ref 70–99)
Glucose-Capillary: 76 mg/dL (ref 70–99)

## 2019-10-29 LAB — HEMOGLOBIN A1C
Hgb A1c MFr Bld: 7.1 % — ABNORMAL HIGH (ref 4.8–5.6)
Mean Plasma Glucose: 157.07 mg/dL

## 2019-10-29 LAB — ECHOCARDIOGRAM COMPLETE: Weight: 3286.4 oz

## 2019-10-29 LAB — HIV ANTIBODY (ROUTINE TESTING W REFLEX): HIV Screen 4th Generation wRfx: NONREACTIVE

## 2019-10-29 MED ORDER — ONDANSETRON HCL 4 MG PO TABS
4.0000 mg | ORAL_TABLET | Freq: Four times a day (QID) | ORAL | Status: DC | PRN
  Administered 2019-10-31: 4 mg via ORAL

## 2019-10-29 MED ORDER — PERFLUTREN LIPID MICROSPHERE
1.0000 mL | INTRAVENOUS | Status: AC | PRN
  Administered 2019-10-29: 2 mL via INTRAVENOUS
  Filled 2019-10-29: qty 10

## 2019-10-29 MED ORDER — TRAZODONE HCL 50 MG PO TABS
50.0000 mg | ORAL_TABLET | Freq: Once | ORAL | Status: DC
  Filled 2019-10-29 (×3): qty 1

## 2019-10-29 MED ORDER — FUROSEMIDE 10 MG/ML IJ SOLN: 40.0000 mg | Freq: Once | INTRAMUSCULAR | Status: DC

## 2019-10-29 MED ORDER — ACETAMINOPHEN 325 MG PO TABS: 650.0000 mg | ORAL_TABLET | Freq: Four times a day (QID) | ORAL | Status: DC | PRN

## 2019-10-29 MED ORDER — ACETAMINOPHEN 650 MG RE SUPP: 650.0000 mg | Freq: Four times a day (QID) | RECTAL | Status: DC | PRN

## 2019-10-29 MED ORDER — HYDROCODONE-ACETAMINOPHEN 5-325 MG PO TABS
1.0000 | ORAL_TABLET | ORAL | Status: DC | PRN
  Administered 2019-10-30 – 2019-10-31 (×3): 1 via ORAL
  Administered 2019-11-04: 2 via ORAL
  Filled 2019-10-29 (×2): qty 1
  Filled 2019-10-29: qty 2
  Filled 2019-10-29: qty 1

## 2019-10-29 MED ORDER — SENNOSIDES-DOCUSATE SODIUM 8.6-50 MG PO TABS
1.0000 | ORAL_TABLET | Freq: Every evening | ORAL | Status: DC | PRN
  Administered 2019-11-04: 1 via ORAL
  Filled 2019-10-29: qty 1

## 2019-10-29 MED ORDER — ADULT MULTIVITAMIN W/MINERALS CH
1.0000 | ORAL_TABLET | Freq: Every day | ORAL | Status: DC
  Administered 2019-10-29 – 2019-11-03 (×6): 1 via ORAL
  Filled 2019-10-29 (×6): qty 1

## 2019-10-29 MED ORDER — FUROSEMIDE 10 MG/ML IJ SOLN
40.0000 mg | Freq: Two times a day (BID) | INTRAMUSCULAR | Status: DC
  Administered 2019-10-29: 40 mg via INTRAVENOUS
  Filled 2019-10-29: qty 4

## 2019-10-29 MED ORDER — HEPARIN SODIUM (PORCINE) 5000 UNIT/ML IJ SOLN
5000.0000 [IU] | Freq: Three times a day (TID) | INTRAMUSCULAR | Status: DC
  Administered 2019-10-29 – 2019-11-06 (×23): 5000 [IU] via SUBCUTANEOUS
  Filled 2019-10-29 (×24): qty 1

## 2019-10-29 MED ORDER — ONDANSETRON HCL 4 MG/2ML IJ SOLN
4.0000 mg | Freq: Four times a day (QID) | INTRAMUSCULAR | Status: DC | PRN
  Administered 2019-10-31 – 2019-11-05 (×4): 4 mg via INTRAVENOUS
  Filled 2019-10-29 (×6): qty 2

## 2019-10-29 MED ORDER — IPRATROPIUM-ALBUTEROL 0.5-2.5 (3) MG/3ML IN SOLN
3.0000 mL | Freq: Three times a day (TID) | RESPIRATORY_TRACT | Status: DC
  Administered 2019-10-29 – 2019-10-30 (×3): 3 mL via RESPIRATORY_TRACT
  Filled 2019-10-29 (×3): qty 3

## 2019-10-29 MED ORDER — ENSURE ENLIVE PO LIQD
237.0000 mL | Freq: Two times a day (BID) | ORAL | Status: DC
  Administered 2019-10-30 – 2019-11-06 (×9): 237 mL via ORAL

## 2019-10-29 MED ORDER — SODIUM CHLORIDE 0.9% FLUSH
3.0000 mL | Freq: Two times a day (BID) | INTRAVENOUS | Status: DC
  Administered 2019-10-29 – 2019-11-06 (×15): 3 mL via INTRAVENOUS

## 2019-10-29 NOTE — Evaluation (Signed)
Physical Therapy Vestibular Evaluation Patient Details Name: Melvin Mcclure MRN: [id number] DOB: 1950-06-28 Today's Date: 10/29/2019   History of Present Illness  69 y.o. male with medical history significant for diastolic HF, hypertension, diabetes mellitus type 2, CKD stage IV, vertigo, presents to the ED complaining of shortness of breath, PND, dizziness and generalized weakness for the past few days. Pt admitted for acute hypoxic respiratory failure and acute on chronic diastolic HF.   Clinical Impression  Patient evaluated by Physical Therapy with no further acute PT needs identified. All education has been completed and the patient has no further questions.  Pt encouraged to f/u with ENT (has appointment next week) and then also provided vestibular OP PT referral if/when pt has symptoms.  Requested pt also keep a diary of vertigo episodes as he has had multiple episodes within the year however unable to recall specifics.  PT is signing off. Thank you for this referral.    10/29/19 1633  Vestibular Assessment  General Observation Pt with history of vertigo during admission 05/2018 and tinnitus and decreased hearing in Right ear also reported at that time (continues to have present date).  Pt reports vertigo episodes about every few months with occur with head movement or world around him moving.  He states just when he feels like he is better, another bad spell starts over again.  Pt has ENT appointment next week and encouraged this visit.  Symptom Behavior  Type of Dizziness  "World moves";Unsteady with head/body turns  Frequency of Dizziness every few months at pt's best guess  Duration of Dizziness Varies  Symptom Nature Motion provoked  Aggravating Factors Walking in a crowd;Turning head sideways  Relieving Factors Rest;Head stationary  Progression of Symptoms Better  Oculomotor Exam  Oculomotor Alignment Abnormal (right eye lower then left)  Spontaneous Absent  Gaze-induced   Absent  Head shaking Horizontal Absent (provokes symptoms)  Head Shaking Vertical Absent  Smooth Pursuits Saccades  Saccades Poor trajectory;Dysmetria  Vestibulo-Ocular Reflex  VOR Cancellation Corrective saccades  Positional Testing  Dix-Hallpike Dix-Hallpike Right;Dix-Hallpike Left  Horizontal Canal Testing Horizontal Canal Right;Horizontal Canal Left  Dix-Hallpike Right  Dix-Hallpike Right Symptoms No nystagmus  Dix-Hallpike Left  Dix-Hallpike Left Symptoms No nystagmus  Horizontal Canal Right  Horizontal Canal Right Symptoms Normal  Horizontal Canal Left  Horizontal Canal Left Symptoms Normal  Positional Sensitivities  Positional Sensitivities Comments No sensitivities during positional testing       Follow Up Recommendations Outpatient PT (provided vestibular OP PT referral if symptoms continue)    Equipment Recommendations  None recommended by PT    Recommendations for Other Services       Precautions / Restrictions Precautions Precautions: Fall      Mobility  Bed Mobility Overal bed mobility: Modified Independent                Transfers Overall transfer level: Needs assistance Equipment used: None Transfers: Sit to/from Stand Sit to Stand: Supervision         General transfer comment: supervision for safety due to hx of dizziness (none reported with mobility at this time)  Ambulation/Gait                Stairs            Wheelchair Mobility    Modified Rankin (Stroke Patients Only)       Balance  Pertinent Vitals/Pain Pain Assessment: No/denies pain    Home Living Family/patient expects to be discharged to:: Private residence Living Arrangements: Spouse/significant other   Type of Home: House       Home Layout: Two level Home Equipment: None      Prior Function Level of Independence: Independent         Comments: only requires assist when  having vertigo     Hand Dominance        Extremity/Trunk Assessment   Upper Extremity Assessment Upper Extremity Assessment: Overall WFL for tasks assessed    Lower Extremity Assessment Lower Extremity Assessment: Overall WFL for tasks assessed    Cervical / Trunk Assessment Cervical / Trunk Assessment: Normal  Communication   Communication: No difficulties  Cognition Arousal/Alertness: Awake/alert Behavior During Therapy: WFL for tasks assessed/performed Overall Cognitive Status: Within Functional Limits for tasks assessed                                        General Comments      Exercises     Assessment/Plan    PT Assessment All further PT needs can be met in the next venue of care  PT Problem List Decreased activity tolerance;Decreased balance;Decreased mobility (when vertigo occurring)       PT Treatment Interventions      PT Goals (Current goals can be found in the Care Plan section)  Acute Rehab PT Goals PT Goal Formulation: All assessment and education complete, DC therapy    Frequency     Barriers to discharge        Co-evaluation               AM-PAC PT "6 Clicks" Mobility  Outcome Measure Help needed turning from your back to your side while in a flat bed without using bedrails?: None Help needed moving from lying on your back to sitting on the side of a flat bed without using bedrails?: None Help needed moving to and from a bed to a chair (including a wheelchair)?: None Help needed standing up from a chair using your arms (e.g., wheelchair or bedside chair)?: None Help needed to walk in hospital room?: A Little Help needed climbing 3-5 steps with a railing? : A Little 6 Click Score: 22    End of Session   Activity Tolerance: Patient tolerated treatment well Patient left: with call bell/phone within reach;in bed;with family/visitor present   PT Visit Diagnosis: Other (comment) (vertigo)    Time: 1330-1406 PT  Time Calculation (min) (ACUTE ONLY): 36 min   Charges:   PT Evaluation $PT Eval Moderate Complexity: 1 Mod PT Treatments $Self Care/Home Management: 8-22      Jannette Spanner PT, DPT Acute Rehabilitation Services Pager: [phone] Office: [phone]  York Ram E 10/29/2019, 4:45 PM

## 2019-10-29 NOTE — ED Notes (Signed)
Pt and wife upset about delay.  Attempted to explain delay and educate that the inpatient plan of care is being followed.  If bed gets pulled, will move Pt to a hospital bed and get wife a recliner.

## 2019-10-29 NOTE — Evaluation (Addendum)
Occupational Therapy Evaluation Patient Details Name: Melvin Mcclure MRN: [id number] DOB: [birth year] Today's Date: 10/29/2019    History of Present Illness 69 y.o. male with medical history significant for diastolic HF, hypertension, diabetes mellitus type 2, CKD stage IV, vertigo, presents to the ED complaining of shortness of breath, PND, dizziness and generalized weakness for the past few days.   Clinical Impression   Pt admitted with the above. Pt currently with functional limitations due to the deficits listed below (see OT Problem List).  Pt will benefit from skilled OT to increase their safety and independence with ADL and functional mobility for ADL to facilitate discharge to venue listed below.   Pt shared with OT positional vertigo.  PT aware. No vertigo noted during OT eval.    Follow Up Recommendations  No OT follow up    Equipment Recommendations  None recommended by OT    Recommendations for Other Services       Precautions / Restrictions Precautions Precautions: Fall      Mobility Bed Mobility Overal bed mobility: Modified Independent                Transfers Overall transfer level: Needs assistance Equipment used: None Transfers: Sit to/from Stand Sit to Stand: Supervision         General transfer comment: supervision for safety due to hx of dizziness (none reported with mobility at this time)        ADL either performed or assessed with clinical judgement   ADL Overall ADL's : Needs assistance/impaired Eating/Feeding: Set up;Sitting   Grooming: Set up;Sitting   Upper Body Bathing: Set up;Sitting   Lower Body Bathing: Minimal assistance;Sit to/from stand;Cueing for safety;Cueing for compensatory techniques   Upper Body Dressing : Set up;Sitting   Lower Body Dressing: Minimal assistance;Sit to/from stand;Cueing for safety;Cueing for compensatory techniques   Toilet Transfer: Minimal assistance   Toileting- Clothing Manipulation and  Hygiene: Minimal assistance               Vision Patient Visual Report: No change from baseline              Pertinent Vitals/Pain Pain Assessment: No/denies pain     Hand Dominance     Extremity/Trunk Assessment Upper Extremity Assessment Upper Extremity Assessment: Overall WFL for tasks assessed   Lower Extremity Assessment Lower Extremity Assessment: Overall WFL for tasks assessed   Cervical / Trunk Assessment Cervical / Trunk Assessment: Normal   Communication Communication Communication: No difficulties   Cognition Arousal/Alertness: Awake/alert Behavior During Therapy: WFL for tasks assessed/performed Overall Cognitive Status: Within Functional Limits for tasks assessed                                                Home Living Family/patient expects to be discharged to:: Private residence Living Arrangements: Spouse/significant other   Type of Home: House       Home Layout: Two level Alternate Level Stairs-Number of Steps: split level home   Bathroom Shower/Tub: Tub/shower unit         Home Equipment: None          Prior Functioning/Environment Level of Independence: Independent        Comments: only requires assist when having vertigo        OT Problem List: Decreased strength;Decreased safety awareness;Decreased knowledge of precautions  OT Treatment/Interventions: Self-care/ADL training;Therapeutic activities;Therapeutic exercise    OT Goals(Current goals can be found in the care plan section)    OT Frequency: Min 2X/week    AM-PAC OT "6 Clicks" Daily Activity     Outcome Measure Help from another person eating meals?: None Help from another person taking care of personal grooming?: A Little Help from another person toileting, which includes using toliet, bedpan, or urinal?: A Little Help from another person bathing (including washing, rinsing, drying)?: A Little Help from another person to put on and  taking off regular upper body clothing?: None Help from another person to put on and taking off regular lower body clothing?: A Little 6 Click Score: 20   End of Session Nurse Communication: Mobility status  Activity Tolerance: Patient tolerated treatment well Patient left: in chair;with family/visitor present  OT Visit Diagnosis: Unsteadiness on feet (R26.81);Muscle weakness (generalized) (M62.81)                Time: 1456-1510 OT Time Calculation (min): 14 min Charges:  OT General Charges $OT Visit: 1 Visit OT Evaluation $OT Eval Moderate Complexity: 1 Mod  Kari Baars, [place] [phone] Office- [phone], Edwena Felty D 10/29/2019, 4:37 PM

## 2019-10-29 NOTE — ED Notes (Signed)
ED TO INPATIENT HANDOFF REPORT  ED Nurse Name and Phone #: Altha Harm [phone]  S Name/Age/Gender Melvin Mcclure 69 y.o. male Room/Bed: WA11/WA11  Code Status   Code Status: Prior  Home/SNF/Other Home Patient oriented to: self, place, time and situation Is this baseline? Yes   Triage Complete: Triage complete  Chief Complaint CAP (community acquired pneumonia) [J18.9]  Triage Note Patient presents with SOB which he has had the last 2 days off and on. He originally went to urgent and was then sent to [place]. HX: CHF     Allergies No Known Allergies  Level of Care/Admitting Diagnosis ED Disposition    ED Disposition Condition Comment   Admit  Hospital Area: [place] [[id number]]  Level of Care: Telemetry [5]  Admit to tele based on following criteria: Acute CHF  May admit patient to [facility] or [facility] if equivalent level of care is available:: Yes  Covid Evaluation: Confirmed COVID Negative  Admission Type: Information Not Available [9]  Diagnosis: CAP (community acquired pneumonia) [518841]  Admitting Physician: Alma Friendly [[id number]]  Attending Physician: Alma Friendly [[id number]]  Estimated length of stay: past midnight tomorrow  Certification:: I certify this patient will need inpatient services for at least 2 midnights       B Medical/Surgery History Past Medical History:  Diagnosis Date  . CHF (congestive heart failure) ([place]) 05/28/2018   dx 05/28/18  . Diabetes mellitus without complication ([place])   . Hyperlipidemia   . Hypertension   . Perforating neurotrophic ulcer of foot ([place])    Past Surgical History:  Procedure Laterality Date  . ACHILLES TENDON REPAIR    . CYST EXCISION       A IV Location/Drains/Wounds Patient Lines/Drains/Airways Status    Active Line/Drains/Airways    Name Placement date Placement time Site Days   Peripheral IV 10/28/19 Right Antecubital 10/28/19  --  Antecubital  1   Peripheral IV  10/28/19 Anterior;Distal;Left;Upper Arm 10/28/19  2125  Arm  1          Intake/Output Last 24 hours  Intake/Output Summary (Last 24 hours) at 10/29/2019 0045 Last data filed at 10/29/2019 0002 Gross per 24 hour  Intake 350 ml  Output 425 ml  Net -75 ml    Labs/Imaging Results for orders placed or performed during the hospital encounter of 10/28/19 (from the past 48 hour(s))  CBC with Differential     Status: Abnormal   Collection Time: 10/28/19  3:27 PM  Result Value Ref Range   WBC 11.2 (H) 4.0 - 10.5 K/uL   RBC 3.77 (L) 4.22 - 5.81 MIL/uL   Hemoglobin 10.3 (L) 13.0 - 17.0 g/dL   HCT 33.2 (L) 39 - 52 %   MCV 88.1 80.0 - 100.0 fL   MCH 27.3 26.0 - 34.0 pg   MCHC 31.0 30.0 - 36.0 g/dL   RDW 15.0 11.5 - 15.5 %   Platelets 232 150 - 400 K/uL   nRBC 0.2 0.0 - 0.2 %   Neutrophils Relative % 73 %   Neutro Abs 8.2 (H) 1.7 - 7.7 K/uL   Lymphocytes Relative 15 %   Lymphs Abs 1.7 0.7 - 4.0 K/uL   Monocytes Relative 10 %   Monocytes Absolute 1.1 (H) 0 - 1 K/uL   Eosinophils Relative 1 %   Eosinophils Absolute 0.1 0 - 0 K/uL   Basophils Relative 0 %   Basophils Absolute 0.0 0 - 0 K/uL   Immature Granulocytes 1 %  Abs Immature Granulocytes 0.07 0.00 - 0.07 K/uL    Comment: Performed at [facility], [place] [address], [place], [place] [address]  Protime-INR     Status: None   Collection Time: 10/28/19  3:27 PM  Result Value Ref Range   Prothrombin Time 14.1 11.4 - 15.2 seconds   INR 1.1 0.8 - 1.2    Comment: (NOTE) INR goal varies based on device and disease states. Performed at [facility], [place] [address], [place], [place] [address]   Troponin I (High Sensitivity)     Status: None   Collection Time: 10/28/19  3:27 PM  Result Value Ref Range   Troponin I (High Sensitivity) 14 <18 ng/L    Comment: (NOTE) Elevated high sensitivity troponin I (hsTnI) values and significant  changes across serial measurements may suggest ACS but many other   chronic and acute conditions are known to elevate hsTnI results.  Refer to the "Links" section for chest pain algorithms and additional  guidance. Performed at [facility], [place] [address], [place], [place] [address]   Brain natriuretic peptide     Status: Abnormal   Collection Time: 10/28/19  3:27 PM  Result Value Ref Range   B Natriuretic Peptide 745.1 (H) 0.0 - 100.0 pg/mL    Comment: Performed at [facility], [place] [address] [place], [place] [address]  Lactic acid, plasma     Status: None   Collection Time: 10/28/19  3:27 PM  Result Value Ref Range   Lactic Acid, Venous 0.8 0.5 - 1.9 mmol/L    Comment: Performed at [facility], [place] [address] [place], [place] [address]  Basic metabolic panel     Status: Abnormal   Collection Time: 10/28/19  3:27 PM  Result Value Ref Range   Sodium 143 135 - 145 mmol/L   Potassium 4.8 3.5 - 5.1 mmol/L   Chloride 109 98 - 111 mmol/L   CO2 19 (L) 22 - 32 mmol/L   Glucose, Bld 150 (H) 70 - 99 mg/dL    Comment: Glucose reference range applies only to samples taken after fasting for at least 8 hours.   BUN 37 (H) 8 - 23 mg/dL   Creatinine, Ser 2.76 (H) 0.61 - 1.24 mg/dL   Calcium 7.4 (L) 8.9 - 10.3 mg/dL   GFR calc non Af Amer 22 (L) >60 mL/min   GFR calc Af Amer 26 (L) >60 mL/min   Anion gap 15 5 - 15    Comment: Performed at [facility], [place] [address] [place], [place] [address]  SARS Coronavirus 2 by RT PCR (hospital order, performed in [facility] hospital lab) Nasopharyngeal Nasopharyngeal Swab     Status: None   Collection Time: 10/28/19  3:27 PM   Specimen: Nasopharyngeal Swab  Result Value Ref Range   SARS Coronavirus 2 NEGATIVE NEGATIVE    Comment: (NOTE) SARS-CoV-2 target nucleic acids are NOT DETECTED.  The SARS-CoV-2 RNA is generally detectable in upper and lower respiratory specimens during the acute phase of infection. The lowest concentration of  SARS-CoV-2 viral copies this assay can detect is 250 copies / mL. A negative result does not preclude SARS-CoV-2 infection and should not be used as the sole basis for treatment or other patient management decisions.  A negative result may occur with improper specimen collection / handling, submission of specimen other than nasopharyngeal swab, presence of viral mutation(s) within the areas targeted by this assay, and  inadequate number of viral copies (<250 copies / mL). A negative result must be combined with clinical observations, patient history, and epidemiological information.  Fact Sheet for Patients:   StrictlyIdeas.no  Fact Sheet for Healthcare Providers: BankingDealers.co.za  This test is not yet approved or  cleared by the [place] FDA and has been authorized for detection and/or diagnosis of SARS-CoV-2 by FDA under an Emergency Use Authorization (EUA).  This EUA will remain in effect (meaning this test can be used) for the duration of the COVID-19 declaration under Section 564(b)(1) of the Act, 21 U.S.C. section 360bbb-3(b)(1), unless the authorization is terminated or revoked sooner.  Performed at [facility], [place] [address] [place], [place] [address]   D-dimer, quantitative (not at [facility])     Status: Abnormal   Collection Time: 10/28/19  3:27 PM  Result Value Ref Range   D-Dimer, Quant 1.10 (H) 0.00 - 0.50 ug/mL-FEU    Comment: (NOTE) At the manufacturer cut-off of 0.50 ug/mL FEU, this assay has been documented to exclude PE with a sensitivity and negative predictive value of 97 to 99%.  At this time, this assay has not been approved by the FDA to exclude DVT/VTE. Results should be correlated with clinical presentation. Performed at [facility], [place] [address] [place], [place] [address]   Troponin I (High Sensitivity)     Status: None   Collection Time: 10/28/19  5:39 PM   Result Value Ref Range   Troponin I (High Sensitivity) 13 <18 ng/L    Comment: (NOTE) Elevated high sensitivity troponin I (hsTnI) values and significant  changes across serial measurements may suggest ACS but many other  chronic and acute conditions are known to elevate hsTnI results.  Refer to the "Links" section for chest pain algorithms and additional  guidance. Performed at [facility], [place] [address], [place], [place] [address]   CBG monitoring, ED     Status: Abnormal   Collection Time: 10/28/19 11:41 PM  Result Value Ref Range   Glucose-Capillary 220 (H) 70 - 99 mg/dL    Comment: Glucose reference range applies only to samples taken after fasting for at least 8 hours.   CT HEAD WO CONTRAST  Result Date: 10/28/2019 CLINICAL DATA:  Dizziness, intermittent nausea and vomiting EXAM: CT HEAD WITHOUT CONTRAST TECHNIQUE: Contiguous axial images were obtained from the base of the skull through the vertex without intravenous contrast. COMPARISON:  03/07/2017 FINDINGS: Brain: Chronic small vessel ischemic changes within the periventricular white matter most pronounced in the right frontal lobe. No acute infarct or hemorrhage. Lateral ventricles and remaining midline structures are unremarkable. No acute extra-axial fluid collections. No mass effect. Vascular: No hyperdense vessel or unexpected calcification. Skull: Normal. Negative for fracture or focal lesion. Sinuses/Orbits: No acute finding. Other: None. IMPRESSION: 1. Stable head CT, no acute process. Electronically Signed   By: Randa Ngo M.D.   On: 10/28/2019 19:07   DG Chest Port 1 View  Result Date: 10/28/2019 CLINICAL DATA:  Shortness of breath. EXAM: PORTABLE CHEST 1 VIEW COMPARISON:  December 15, 2018 FINDINGS: Mild to moderate severity infiltrate is seen within the right lung base. There is a small right pleural effusion. No pneumothorax is identified. There is mild to moderate severity enlargement of the  cardiac silhouette. The visualized skeletal structures are unremarkable. IMPRESSION: Mild to moderate severity right basilar infiltrate with a small right pleural effusion. Electronically Signed   By: Virgina Norfolk M.D.   On: 10/28/2019 15:57  Pending Labs [employer] (From admission, onward) Comment          Start     Ordered   10/29/19 0500  Hemoglobin A1c  Tomorrow morning,   R       Comments: To assess prior glycemic control    10/28/19 1759   10/28/19 1404  Culture, blood (routine x 2)  BLOOD CULTURE X 2,   STAT      10/28/19 1404   Signed and Held  HIV Antibody (routine testing w rflx)  (HIV Antibody (Routine testing w reflex) panel)  Once,   R        Signed and Held   Signed and Held  Comprehensive metabolic panel  Tomorrow morning,   R        Signed and Held   Signed and Held  CBC  Tomorrow morning,   R        Signed and Held          Vitals/Pain Today's Vitals   10/28/19 2315 10/29/19 0000 10/29/19 0013 10/29/19 0028  BP: (!) 144/75 (!) 179/90 (!) 168/91 (!) 174/96  Pulse: 81 80 77 80  Resp: (!) 28 (!) 32 (!) 28 (!) 24  Temp:      TempSrc:      SpO2: 93% 94% 94% 95%  PainSc:        Isolation Precautions No active isolations  Medications Medications  ipratropium-albuterol (DUONEB) 0.5-2.5 (3) MG/3ML nebulizer solution 3 mL (3 mLs Nebulization Given 10/28/19 1925)  ipratropium-albuterol (DUONEB) 0.5-2.5 (3) MG/3ML nebulizer solution 3 mL (has no administration in time range)  amLODipine (NORVASC) tablet 10 mg (has no administration in time range)  aspirin EC tablet 81 mg (has no administration in time range)  meclizine (ANTIVERT) tablet 25 mg (25 mg Oral Given 10/28/19 2216)  metoprolol succinate (TOPROL-XL) 24 hr tablet 25 mg (25 mg Oral Given 10/28/19 2217)  hydrALAZINE (APRESOLINE) tablet 25 mg (25 mg Oral Given 10/28/19 2217)  insulin aspart (novoLOG) injection 0-9 Units (has no administration in time range)  cefTRIAXone (ROCEPHIN) 1 g in sodium  chloride 0.9 % 100 mL IVPB (has no administration in time range)  doxycycline (VIBRA-TABS) tablet 100 mg (has no administration in time range)  cefTRIAXone (ROCEPHIN) 1 g in sodium chloride 0.9 % 100 mL IVPB (0 g Intravenous Stopped 10/28/19 1644)  azithromycin (ZITHROMAX) 500 mg in sodium chloride 0.9 % 250 mL IVPB (0 mg Intravenous Stopped 10/29/19 0002)  furosemide (LASIX) injection 40 mg (40 mg Intravenous Given 10/28/19 1938)    Mobility walks Low fall risk   Focused Assessments Pulmonary Assessment Handoff:  Lung sounds: Bilateral Breath Sounds: Diminished, Expiratory wheezes, Fine crackles O2 Device: Nasal Cannula O2 Flow Rate (L/min): 3 L/min      R Recommendations: See Admitting Provider Note  Report given to:   Additional Notes:

## 2019-10-29 NOTE — Progress Notes (Signed)
Bilateral lower extremity venous duplex has been completed. Preliminary results can be found in CV Proc through chart review.   10/29/19 9:08 AM Melvin Mcclure RVT

## 2019-10-29 NOTE — Progress Notes (Signed)
Initial Nutrition Assessment  DOCUMENTATION CODES:   Not applicable  INTERVENTION:  Ensure Enlive po BID, each supplement provides 350 kcal and 20 grams of protein MVI with minerals daily   NUTRITION DIAGNOSIS:   Inadequate oral intake related to acute illness (CAP) as evidenced by meal completion < 50%.    GOAL:   Patient will meet greater than or equal to 90% of their needs  MONITOR:   Labs, Supplement acceptance, PO intake, Weight trends  REASON FOR ASSESSMENT:   Malnutrition Screening Tool    ASSESSMENT:  RD working remotely.  69 year old male admitted for CAP with past medical history of dCHF, HTN, DM2, CKD stage IV, vertigo presented with SOB, PND, dizziness, and generalized weakness over the past few days.  Patient currently undergoing cardiac ultrasound per pt location, unable to contact via phone to obtain nutrition history at this time. Patient with poor po intake, per flowsheets he consumed 20% of lunch tray today. Will provide Ensure supplement to aid with meeting needs.   No recent weight history for review, per chart  weights have been stable 197-205 lb over the past 5 years.  Medications reviewed and include: Doxycycline, SSI IVPB: Rocephin Labs: CBGs 125, 220, BUN 34 (H), Cr 2.97 (H) Lab Results  Component Value Date   HGBA1C 7.1 (H) 10/29/2019    NUTRITION - FOCUSED PHYSICAL EXAM: Unable to complete at this time, RD working remotely.  Diet Order:   Diet Order            Diet heart healthy/carb modified Room service appropriate? Yes; Fluid consistency: Thin  Diet effective now                 EDUCATION NEEDS:   No education needs have been identified at this time  Skin:  Skin Assessment: Reviewed RN Assessment  Last BM:  unknown  Height:   Ht Readings from Last 1 Encounters:  07/07/18 5\' 10"  (1.778 m)    Weight:   Wt Readings from Last 1 Encounters:  10/29/19 93.2 kg    BMI:  Body mass index is 29.47 kg/m.  Estimated  Nutritional Needs:   Kcal:  1791-5056  Protein:  115-125  Fluid:  >/= 2.3 L/day  Lajuan Lines, RD, LDN Clinical Nutrition After Hours/Weekend Pager # in [place]

## 2019-10-29 NOTE — Progress Notes (Signed)
PROGRESS NOTE  [name] PTW:[id number] DOB: 1950/08/19 DOA: 10/28/2019 PCP: Nolene Ebbs, MD  HPI/Recap of past 24 hours: Melvin Mcclure is a 69 y.o. male with medical history significant for diastolic HF, hypertension, diabetes mellitus type 2, CKD stage IV, vertigo, presents to the ED complaining of shortness of breath, PND, dizziness and generalized weakness for the past few days.  Patient presents with his wife, who provided most of the history. Roughly about 2 to 3 days ago patient progressively got short of breath, denied any chest pain, any significant coughing, reported shortness of breath worse at nighttime unable to sleep, wife noted some low-grade fever, denied any abdominal pain.  Patient does report history of vertigo with dizziness and occasional nausea/vomiting.  Patient also complained of intermittent diarrhea/constipation.  Patient denies any recent sick contacts, travel history.has received his Covid immunization.  Patient reports compliance to his medication, quit smoking over 20 years ago, drinks alcohol occasionally.  Due to worsening symptoms, patient presented to the ED. In the ED, patient noted to be hypoxic, desaturating to the 80s requiring about 3 L of oxygen to stay above 90%, tachypneic, BP somewhat elevated.  Labs reviewed creatinine around baseline, mild acidosis, leukocytosis, BNP elevated, troponin negative, EKG with some T wave inversions in leads, unchanged from previous, chest x-ray showed possible right lobe infiltrate.  Patient started on ceftriaxone, azithromycin by EDP.  [facility] hospitalist consulted for admission and further management.    Today, patient denies any new complaints, continues to require about 3 L of oxygen, although denies worsening shortness of breath, denies chest pain, abdominal pain, nausea/vomiting, fever/chills, cough.    Assessment/Plan: Principal Problem:   CAP (community acquired pneumonia) Active Problems:   Type 2 diabetes  mellitus with stage 3 chronic kidney disease (HCC)   CHF (congestive heart failure) (HCC)   Acute hypoxic respiratory failure Acute on chronic diastolic HF SOB, tachypnea, desaturated to the 80s on room air, likely ?sacral edema on admission BNP elevated-745.1 Troponin negative, EKG with some T wave inversion in inferior leads, unchanged from previous in 2020 Chest x-ray showed right lobe infiltrates Echo showed EF of 55-60%, no RWMA, mod LVH, LV diastolic parameters are indeterminate Will give IV Lasix prn based on renal fxn due to CKD Hold home torsemide, lisinopril for now Continue supplemental oxygen as needed, plan to wean off Strict I's and O's, daily weights Telemetry  Possible community-acquired pneumonia Chest x-ray as above D-dimer elevated at 1.1, low suspicion for PE Doppler of bilateral lower extremity neg for DVT BC x2 NGTD Continue ceftriaxone, doxycycline Duo nebs scheduled, as needed, incentive spirometry Supplemental oxygen as needed  CKD stage IV Creatinine around baseline, 2.7 Hold home lisinopril for now May involve nephrology if Cr continues to rise Daily BMP, avoid nephrotoxics  Anemia of chronic kidney disease Baseline hemoglobin around 10 Daily CBC  Hypertension BP somewhat uncontrolled Continue home hydralazine, amlodipine, hold lisinopril Monitor closely  Type 2 diabetes mellitus A1c 7.1 SSI, Accu-Cheks, hypoglycemic protocol  History of vertigo Reports associated dizziness, intermittent nausea/vomiting CT head unremarkable PT/OT-apparently has tried Epley's maneuver with PT without any success Scheduled to see ENT I believe Dr. Redmond Baseman next week follow up as outpt Fall precautions  Prolonged QTC Avoid QTC prolonging medications Frequent EKG        Malnutrition Type:  Nutrition Problem: Inadequate oral intake Etiology: acute illness (CAP)   Malnutrition Characteristics:  Signs/Symptoms: meal completion <  50%   Nutrition Interventions:  Interventions: Ensure Enlive (each supplement provides 350kcal  and 20 grams of protein), MVI    Estimated body mass index is 28.65 kg/m as calculated from the following:   Height as of this encounter: 5\' 11"  (1.803 m).   Weight as of this encounter: 93.2 kg.     Code Status: Full  Family Communication: Discussed with wife at bedside on 10/28/2019  Disposition Plan: Status is: Inpatient  Remains inpatient appropriate because:Inpatient level of care appropriate due to severity of illness   Dispo: The patient is from: Home              Anticipated d/c is to: Home              Anticipated d/c date is: 3 days              Patient currently is not medically stable to d/c.    Consultants:  None  Procedures:  None  Antimicrobials:  Ceftriaxone  Doxycycline  DVT prophylaxis: Heparin [place]   Objective: Vitals:   10/29/19 1032 10/29/19 1355 10/29/19 1414 10/29/19 1433  BP: (!) 164/87 (!) 142/80    Pulse: 79 77    Resp: 16 18    Temp: 98.5 F (36.9 C) 98.5 F (36.9 C)    TempSrc: Oral Oral    SpO2: 95% 98% 96%   Weight: 93.2 kg     Height:    5\' 11"  (1.803 m)    Intake/Output Summary (Last 24 hours) at 10/29/2019 1508 Last data filed at 10/29/2019 1300 Gross per 24 hour  Intake 470 ml  Output 875 ml  Net -405 ml   Filed Weights   10/29/19 1032  Weight: 93.2 kg    Exam:  General: NAD   Cardiovascular: S1, S2 present  Respiratory:  Diminished breath sounds bilaterally, bibasilar crackles noted  Abdomen: Soft, nontender, nondistended, bowel sounds present  Musculoskeletal: No bilateral pedal edema noted  Skin: Normal  Psychiatry: Normal mood   Data Reviewed: CBC: Recent Labs  Lab 10/28/19 1527 10/29/19 0758  WBC 11.2* 10.1  NEUTROABS 8.2*  --   HGB 10.3* 9.6*  HCT 33.2* 30.6*  MCV 88.1 86.4  PLT 232 948   Basic Metabolic Panel: Recent Labs  Lab 10/28/19 1527 10/29/19 0758  NA 143 141  K 4.8 4.6   CL 109 108  CO2 19* 23  GLUCOSE 150* 145*  BUN 37* 34*  CREATININE 2.76* 2.97*  CALCIUM 7.4* 7.0*   GFR: Estimated Creatinine Clearance: 27.4 mL/min (A) (by C-G formula based on SCr of 2.97 mg/dL (H)). Liver Function Tests: Recent Labs  Lab 10/29/19 0758  AST 16  ALT 18  ALKPHOS 66  BILITOT 0.9  PROT 7.6  ALBUMIN 3.4*   No results for input(s): LIPASE, AMYLASE in the last 168 hours. No results for input(s): AMMONIA in the last 168 hours. Coagulation Profile: Recent Labs  Lab 10/28/19 1527  INR 1.1   Cardiac Enzymes: No results for input(s): CKTOTAL, CKMB, CKMBINDEX, TROPONINI in the last 168 hours. BNP (last 3 results) No results for input(s): PROBNP in the last 8760 hours. HbA1C: Recent Labs    10/29/19 0535  HGBA1C 7.1*   CBG: Recent Labs  Lab 10/28/19 2341 10/29/19 0718 10/29/19 1103  GLUCAP 220* 125* 125*   Lipid Profile: No results for input(s): CHOL, HDL, LDLCALC, TRIG, CHOLHDL, LDLDIRECT in the last 72 hours. Thyroid Function Tests: No results for input(s): TSH, T4TOTAL, FREET4, T3FREE, THYROIDAB in the last 72 hours. Anemia Panel: No results for input(s): VITAMINB12, FOLATE, FERRITIN, TIBC,  IRON, RETICCTPCT in the last 72 hours. Urine analysis:    Component Value Date/Time   COLORURINE YELLOW 03/07/2017 1413   APPEARANCEUR CLEAR 03/07/2017 1413   LABSPEC 1.015 03/07/2017 1413   PHURINE 5.0 03/07/2017 1413   GLUCOSEU >=500 (A) 03/07/2017 1413   HGBUR MODERATE (A) 03/07/2017 1413   BILIRUBINUR NEGATIVE 03/07/2017 1413   KETONESUR 5 (A) 03/07/2017 1413   PROTEINUR 100 (A) 03/07/2017 1413   UROBILINOGEN 0.2 06/17/2010 1620   NITRITE NEGATIVE 03/07/2017 1413   LEUKOCYTESUR NEGATIVE 03/07/2017 1413   Sepsis Labs: @LABRCNTIP (procalcitonin:4,lacticidven:4)  ) Recent Results (from the past 240 hour(s))  Culture, blood (routine x 2)     Status: None (Preliminary result)   Collection Time: 10/28/19  3:27 PM   Specimen: BLOOD  Result Value Ref  Range Status   Specimen Description   Final    BLOOD LEFT ANTECUBITAL Performed at [facility], [place] [address], [place], [place] [address]    Special Requests   Final    BOTTLES DRAWN AEROBIC AND ANAEROBIC Blood Culture results may not be optimal due to an excessive volume of blood received in culture bottles Performed at [facility] [address], [place], [place] [address]    Culture   Final    NO GROWTH < 12 HOURS Performed at [facility] [address] [place], [place] [address]    Report Status PENDING  Incomplete  Culture, blood (routine x 2)     Status: None (Preliminary result)   Collection Time: 10/28/19  3:27 PM   Specimen: BLOOD  Result Value Ref Range Status   Specimen Description   Final    BLOOD RIGHT ANTECUBITAL Performed at [facility] [address] [place], [place] [address]    Special Requests   Final    BOTTLES DRAWN AEROBIC AND ANAEROBIC Blood Culture adequate volume Performed at [facility] [address] [place], [place] [address]    Culture   Final    NO GROWTH < 12 HOURS Performed at [facility] [address] [place], [place] [address]    Report Status PENDING  Incomplete  SARS Coronavirus 2 by RT PCR (hospital order, performed in [facility] hospital lab) Nasopharyngeal Nasopharyngeal Swab     Status: None   Collection Time: 10/28/19  3:27 PM   Specimen: Nasopharyngeal Swab  Result Value Ref Range Status   SARS Coronavirus 2 NEGATIVE NEGATIVE Final    Comment: (NOTE) SARS-CoV-2 target nucleic acids are NOT DETECTED.  The SARS-CoV-2 RNA is generally detectable in upper and lower respiratory specimens during the acute phase of infection. The lowest concentration of SARS-CoV-2 viral copies this assay can detect is 250 copies / mL. A negative result does not preclude SARS-CoV-2 infection and should not be used as the sole basis for treatment or  other patient management decisions.  A negative result may occur with improper specimen collection / handling, submission of specimen other than nasopharyngeal swab, presence of viral mutation(s) within the areas targeted by this assay, and inadequate number of viral copies (<250 copies / mL). A negative result must be combined with clinical observations, patient history, and epidemiological information.  Fact Sheet for Patients:   StrictlyIdeas.no  Fact Sheet for Healthcare Providers: BankingDealers.co.za  This test is not yet approved or  cleared by the [place] FDA and has been authorized for detection and/or diagnosis of SARS-CoV-2 by FDA under an Emergency Use Authorization (EUA).  This EUA will remain in effect (meaning this test can be used) for the duration of the COVID-19 declaration under Section 564(b)(1) of the Act, 21 U.S.C. section 360bbb-3(b)(1), unless the authorization is terminated or revoked sooner.  Performed at [facility], [place] [address] [place], [place] [address]       Studies: CT HEAD WO CONTRAST  Result Date: 10/28/2019 CLINICAL DATA:  Dizziness, intermittent nausea and vomiting EXAM: CT HEAD WITHOUT CONTRAST TECHNIQUE: Contiguous axial images were obtained from the base of the skull through the vertex without intravenous contrast. COMPARISON:  03/07/2017 FINDINGS: Brain: Chronic small vessel ischemic changes within the periventricular white matter most pronounced in the right frontal lobe. No acute infarct or hemorrhage. Lateral ventricles and remaining midline structures are unremarkable. No acute extra-axial fluid collections. No mass effect. Vascular: No hyperdense vessel or unexpected calcification. Skull: Normal. Negative for fracture or focal lesion. Sinuses/Orbits: No acute finding. Other: None. IMPRESSION: 1. Stable head CT, no acute process. Electronically Signed   By: Randa Ngo M.D.   On: 10/28/2019 19:07   DG Chest Port 1 View  Result Date: 10/28/2019 CLINICAL DATA:  Shortness of breath. EXAM: PORTABLE CHEST 1 VIEW COMPARISON:  December 15, 2018 FINDINGS: Mild to moderate severity infiltrate is seen within the right lung base. There is a small right pleural effusion. No pneumothorax is identified. There is mild to moderate severity enlargement of the cardiac silhouette. The visualized skeletal structures are unremarkable. IMPRESSION: Mild to moderate severity right basilar infiltrate with a small right pleural effusion. Electronically Signed   By: Virgina Norfolk M.D.   On: 10/28/2019 15:57   ECHOCARDIOGRAM COMPLETE  Result Date: 10/29/2019    ECHOCARDIOGRAM REPORT   Patient Name:   ELISHA COOKSEY Date of Exam: 10/29/2019 Medical Rec #:  [id number]      Height:       70.0 in Accession #:    [id number]     Weight:       204.0 lb Date of Birth:  20-Apr-1951      BSA:          2.105 m Patient Age:    36 years       BP:           157/79 mmHg Patient Gender: M              HR:           75 bpm. Exam Location:  Inpatient Procedure: 2D Echo, Cardiac Doppler, Color Doppler and Intracardiac            Opacification Agent Indications:    Acute Respiratory Insufficiency 518.82 / R06.89  History:        Patient has prior history of Echocardiogram examinations, most                 recent 05/29/2018. CHF; Risk Factors:Hypertension, Diabetes and                 Dyslipidemia.  Sonographer:    Tiffany Dance Referring Phys: [id number] [place]  1. Left ventricular ejection fraction, by estimation, is 55 to 60%. The left ventricle has normal function. The left ventricle has no regional wall motion abnormalities. There is moderate left ventricular hypertrophy. Left ventricular diastolic parameters are indeterminate.  2. Right ventricular systolic function is normal. The right ventricular size is normal. Tricuspid regurgitation signal is inadequate for assessing PA pressure.  3.  The mitral valve is grossly normal. Mild mitral  valve regurgitation.  4. The aortic valve is tricuspid, mildly calcified. Aortic valve regurgitation is not visualized.  5. The inferior vena cava is normal in size with greater than 50% respiratory variability, suggesting right atrial pressure of 3 mmHg.  6. Trivial to small circumferential pericardial effusion. FINDINGS  Left Ventricle: Left ventricular ejection fraction, by estimation, is 55 to 60%. The left ventricle has normal function. The left ventricle has no regional wall motion abnormalities. Definity contrast agent was given IV to delineate the left ventricular  endocardial borders. The left ventricular internal cavity size was normal in size. There is moderate left ventricular hypertrophy. Left ventricular diastolic parameters are indeterminate. Right Ventricle: The right ventricular size is normal. No increase in right ventricular wall thickness. Right ventricular systolic function is normal. Tricuspid regurgitation signal is inadequate for assessing PA pressure. Left Atrium: Left atrial size was normal in size. Right Atrium: Right atrial size was normal in size. Pericardium: Trivial pericardial effusion is present. The pericardial effusion is circumferential. Mitral Valve: The mitral valve is grossly normal. Mild mitral valve regurgitation. Tricuspid Valve: The tricuspid valve is grossly normal. Tricuspid valve regurgitation is mild. Aortic Valve: The aortic valve is tricuspid. Aortic valve regurgitation is not visualized. There is mild calcification of the aortic valve. Pulmonic Valve: The pulmonic valve was grossly normal. Pulmonic valve regurgitation is trivial. Aorta: The aortic root is normal in size and structure. Venous: The inferior vena cava is normal in size with greater than 50% respiratory variability, suggesting right atrial pressure of 3 mmHg. IAS/Shunts: No atrial level shunt detected by color flow Doppler.  LEFT VENTRICLE PLAX 2D LVIDd:          4.60 cm  Diastology LVIDs:         3.30 cm  LV e' lateral:   8.38 cm/s LV PW:         1.60 cm  LV E/e' lateral: 11.1 LV IVS:        1.40 cm  LV e' medial:    6.85 cm/s LVOT diam:     2.10 cm  LV E/e' medial:  13.5 LV SV:         79 LV SV Index:   38 LVOT Area:     3.46 cm  RIGHT VENTRICLE             IVC RV Basal diam:  3.20 cm     IVC diam: 1.90 cm RV Mid diam:    2.60 cm RV S prime:     12.90 cm/s TAPSE (M-mode): 2.4 cm LEFT ATRIUM              Index       RIGHT ATRIUM           Index LA diam:        4.40 cm  2.09 cm/m  RA Area:     16.00 cm LA Vol (A2C):   106.0 ml 50.36 ml/m RA Volume:   39.20 ml  18.62 ml/m LA Vol (A4C):   56.9 ml  27.03 ml/m LA Biplane Vol: 77.8 ml  36.96 ml/m  AORTIC VALVE LVOT Vmax:   105.00 cm/s LVOT Vmean:  64.600 cm/s LVOT VTI:    0.229 m  AORTA Ao Root diam: 3.10 cm Ao Asc diam:  3.10 cm MITRAL VALVE MV Area (PHT): 2.76 cm    SHUNTS MV Decel Time: 275 msec    Systemic VTI:  0.23 m MV E velocity: 92.70 cm/s  Systemic Diam: 2.10 cm  MV A velocity: 56.80 cm/s MV E/A ratio:  1.63 Rozann Lesches MD Electronically signed by Rozann Lesches MD Signature Date/Time: 10/29/2019/2:28:25 PM    Final    VAS [place] LOWER EXTREMITY VENOUS (DVT)  Result Date: 10/29/2019  Lower Venous DVTStudy Indications: Elevated Ddimer.  Risk Factors: None identified. Comparison Study: No prior studies. Performing Technologist: Oliver Hum RVT  Examination Guidelines: A complete evaluation includes B-mode imaging, spectral Doppler, color Doppler, and power Doppler as needed of all accessible portions of each vessel. Bilateral testing is considered an integral part of a complete examination. Limited examinations for reoccurring indications may be performed as noted. The reflux portion of the exam is performed with the patient in reverse Trendelenburg.  +---------+---------------+---------+-----------+----------+--------------+ RIGHT    CompressibilityPhasicitySpontaneityPropertiesThrombus Aging  +---------+---------------+---------+-----------+----------+--------------+ CFV      Full           Yes      Yes                                 +---------+---------------+---------+-----------+----------+--------------+ SFJ      Full                                                        +---------+---------------+---------+-----------+----------+--------------+ FV Prox  Full                                                        +---------+---------------+---------+-----------+----------+--------------+ FV Mid   Full                                                        +---------+---------------+---------+-----------+----------+--------------+ FV DistalFull                                                        +---------+---------------+---------+-----------+----------+--------------+ PFV      Full                                                        +---------+---------------+---------+-----------+----------+--------------+ POP      Full           Yes      Yes                                 +---------+---------------+---------+-----------+----------+--------------+ PTV      Full                                                        +---------+---------------+---------+-----------+----------+--------------+  PERO     Full                                                        +---------+---------------+---------+-----------+----------+--------------+   +---------+---------------+---------+-----------+----------+--------------+ LEFT     CompressibilityPhasicitySpontaneityPropertiesThrombus Aging +---------+---------------+---------+-----------+----------+--------------+ CFV      Full           Yes      Yes                                 +---------+---------------+---------+-----------+----------+--------------+ SFJ      Full                                                         +---------+---------------+---------+-----------+----------+--------------+ FV Prox  Full                                                        +---------+---------------+---------+-----------+----------+--------------+ FV Mid   Full                                                        +---------+---------------+---------+-----------+----------+--------------+ FV DistalFull                                                        +---------+---------------+---------+-----------+----------+--------------+ PFV      Full                                                        +---------+---------------+---------+-----------+----------+--------------+ POP      Full           Yes      Yes                                 +---------+---------------+---------+-----------+----------+--------------+ PTV      Full                                                        +---------+---------------+---------+-----------+----------+--------------+ PERO     Full                                                        +---------+---------------+---------+-----------+----------+--------------+  Summary: RIGHT: - There is no evidence of deep vein thrombosis in the lower extremity.  - No cystic structure found in the popliteal fossa.  LEFT: - There is no evidence of deep vein thrombosis in the lower extremity.  - No cystic structure found in the popliteal fossa.  *See table(s) above for measurements and observations.    Preliminary     Scheduled Meds: . amLODipine  10 mg Oral Daily  . aspirin EC  81 mg Oral Daily  . doxycycline  100 mg Oral Q12H  . feeding supplement (ENSURE ENLIVE)  237 mL Oral BID BM  . heparin  5,000 Units Subcutaneous Q8H  . hydrALAZINE  25 mg Oral Q8H  . insulin aspart  0-9 Units Subcutaneous TID WC  . ipratropium-albuterol  3 mL Nebulization TID  . meclizine  25 mg Oral TID  . metoprolol succinate  25 mg Oral Daily  . multivitamin with minerals  1  tablet Oral Daily  . sodium chloride flush  3 mL Intravenous Q12H    Continuous Infusions: . cefTRIAXone (ROCEPHIN)  IV       LOS: 1 day     Alma Friendly, MD [facility]  If 7PM-7AM, please contact night-coverage [email or web address] 10/29/2019, 3:08 PM

## 2019-10-29 NOTE — ED Notes (Signed)
Visitation explained to Pt and wife.  Verbalized understanding.  All belongings taken w/ Pt upstairs.  Eye glasses placed in shoes.

## 2019-10-29 NOTE — Progress Notes (Signed)
  Echocardiogram 2D Echocardiogram has been performed.  Melvin Mcclure G Chanell Nadeau 10/29/2019, 11:53 AM

## 2019-10-30 ENCOUNTER — Inpatient Hospital Stay (HOSPITAL_COMMUNITY): Payer: Medicare Other

## 2019-10-30 LAB — GLUCOSE, CAPILLARY
Glucose-Capillary: 124 mg/dL — ABNORMAL HIGH (ref 70–99)
Glucose-Capillary: 126 mg/dL — ABNORMAL HIGH (ref 70–99)
Glucose-Capillary: 139 mg/dL — ABNORMAL HIGH (ref 70–99)
Glucose-Capillary: 157 mg/dL — ABNORMAL HIGH (ref 70–99)

## 2019-10-30 LAB — URINALYSIS, ROUTINE W REFLEX MICROSCOPIC
Bacteria, UA: NONE SEEN
Bilirubin Urine: NEGATIVE
Glucose, UA: NEGATIVE mg/dL
Hgb urine dipstick: NEGATIVE
Ketones, ur: NEGATIVE mg/dL
Leukocytes,Ua: NEGATIVE
Nitrite: NEGATIVE
Protein, ur: 100 mg/dL — AB
Specific Gravity, Urine: 1.014 (ref 1.005–1.030)
pH: 5 (ref 5.0–8.0)

## 2019-10-30 MED ORDER — IPRATROPIUM-ALBUTEROL 0.5-2.5 (3) MG/3ML IN SOLN
3.0000 mL | Freq: Two times a day (BID) | RESPIRATORY_TRACT | Status: DC
  Administered 2019-10-30: 3 mL via RESPIRATORY_TRACT
  Filled 2019-10-30 (×2): qty 3

## 2019-10-30 MED ORDER — PHENOL 1.4 % MT LIQD: 1.0000 | OROMUCOSAL | Status: DC | PRN

## 2019-10-30 NOTE — Consult Note (Signed)
[facility]  HISTORY AND PHYSICAL  Melvin Mcclure is an 69 y.o. male.    Chief Complaint: SOB/ weakness  HPI: Pt is a 44M with a PMH sig for HTN, HLD, DM II, CHF, and CKD IV with baseline in the mid-2s as of 12/2018 (2.28) who is now seen in consultation at the request of Dr. Horris Latino for eval and recs re: AKI on CKD IV.   Pt was in his usual state of health until about 2-3 days prior to admission when he began developing SOB, weakness, PND, and dizziness.   Had some low-grade fevers, no cough.  D/t his symptoms, came to ED where he was found to have pneumonia and some volume overload.  He has been placed on doxycycline and ceftriaxone.  Cr was 2.7 on admission and 2.9 today, prompting the consult.  TTE 6/26 with no WMA, EF 55-60%, moderate LVH, and > 50% IVC respiratory variability.  Got 40 IV Lasix yesterday.  Takes lisinopril and torsemide MWF at home.  Renal US no obstruction but evidence of medicorenal disease.    He is followed by Dr Royce Macadamia, last visit 08/25/2019.  Found to be doing well at that visit with Cr 2.3.  Today feeling better.  Sitting up in chair.  SOB still but improved.    PMH: Past Medical History:  Diagnosis Date  . CHF (congestive heart failure) ([place]) 05/28/2018   dx 05/28/18  . Diabetes mellitus without complication ([place])   . Hyperlipidemia   . Hypertension   . Perforating neurotrophic ulcer of foot (HCC)    PSH: Past Surgical History:  Procedure Laterality Date  . ACHILLES TENDON REPAIR    . CYST EXCISION      Past Medical History:  Diagnosis Date  . CHF (congestive heart failure) ([place]) 05/28/2018   dx 05/28/18  . Diabetes mellitus without complication ([place])   . Hyperlipidemia   . Hypertension   . Perforating neurotrophic ulcer of foot (HCC)     Medications:   Scheduled: . amLODipine  10 mg Oral Daily  . aspirin EC  81 mg Oral Daily  . doxycycline  100 mg Oral Q12H  . feeding supplement (ENSURE ENLIVE)  237 mL Oral BID BM  .  heparin  5,000 Units Subcutaneous Q8H  . hydrALAZINE  25 mg Oral Q8H  . insulin aspart  0-9 Units Subcutaneous TID WC  . ipratropium-albuterol  3 mL Nebulization BID  . meclizine  25 mg Oral TID  . metoprolol succinate  25 mg Oral Daily  . multivitamin with minerals  1 tablet Oral Daily  . sodium chloride flush  3 mL Intravenous Q12H  . traZODone  50 mg Oral Once    Medications Prior to Admission  Medication Sig Dispense Refill  . amLODipine (NORVASC) 10 MG tablet Take 10 mg by mouth daily.    Marland Kitchen aspirin 81 MG tablet Take 81 mg by mouth daily.    . calcitRIOL (ROCALTROL) 0.25 MCG capsule Take 0.25 mcg by mouth 2 (two) times a week.    . calcium carbonate (TUMS - DOSED IN MG ELEMENTAL CALCIUM) 500 MG chewable tablet Chew 1 tablet by mouth daily.    . ferrous sulfate 325 (65 FE) MG tablet Take 325 mg by mouth daily with breakfast.    . gabapentin (NEURONTIN) 300 MG capsule Take 300 mg by mouth 3 (three) times daily.    . hydrALAZINE (APRESOLINE) 100 MG tablet Take 100 mg by mouth 3 (three) times daily.    Marland Kitchen  lisinopril (ZESTRIL) 10 MG tablet Take 10 mg by mouth daily.    . meclizine (ANTIVERT) 25 MG tablet Take 25 mg by mouth 3 (three) times daily as needed for dizziness or nausea.     . metoprolol succinate (TOPROL-XL) 25 MG 24 hr tablet Take 25 mg by mouth daily.    Marland Kitchen senna-docusate (SENOKOT-S) 8.6-50 MG tablet Take 1 tablet by mouth 2 (two) times daily.    Marland Kitchen torsemide (DEMADEX) 20 MG tablet Take 1 tablet (20 mg total) by mouth daily. 30 tablet 0  . gabapentin (NEURONTIN) 100 MG capsule Take 1 capsule (100 mg total) by mouth at bedtime. (Patient not taking: Reported on 10/28/2019) 30 capsule 0  . hydrALAZINE (APRESOLINE) 50 MG tablet Take 1 tablet (50 mg total) by mouth every 8 (eight) hours. (Patient not taking: Reported on 10/28/2019) 90 tablet 0  . insulin aspart (NOVOLOG) 100 UNIT/ML injection Inject 4-8 Units into the skin daily as needed for high blood sugar. Per sliding scale (Patient  not taking: Reported on 10/28/2019)    . insulin glargine (LANTUS) 100 UNIT/ML injection Inject 0.05 mLs (5 Units total) into the skin daily. (Patient not taking: Reported on 10/28/2019) 10 mL 0  . metoCLOPramide (REGLAN) 10 MG tablet Take 1 tablet (10 mg total) by mouth every 6 (six) hours. (Patient not taking: Reported on 10/28/2019) 30 tablet 0  . Vitamin D, Ergocalciferol, (DRISDOL) 1.25 MG (50000 UT) CAPS capsule Take 1 capsule (50,000 Units total) by mouth every 7 (seven) days. (Patient not taking: Reported on 10/28/2019) 5 capsule 0    ALLERGIES:  No Known Allergies  FAM HX: Family History  Problem Relation Age of Onset  . Diabetes Mellitus II Mother   . Hypertension Father     Social History:   reports that he has never smoked. He has never used smokeless tobacco. He reports that he does not drink alcohol and does not use drugs.  ROS: ROS: all other systems reviewed and are negative except as per HPI  Blood pressure (!) 164/81, pulse 79, temperature 98.6 F (37 C), temperature source Oral, resp. rate 14, height 5\' 11"  (1.803 m), weight 93.2 kg, SpO2 (!) 89 %. PHYSICAL EXAM: Physical Exam  GEN NAD, sitting in chair HEENT EOMI PERRL off O2 NECK no JVD PULM clear except RLL inspiratory crackles CV RRR no m/r/g ABD soft, nontedner EXT no LE edema NEURO AAO x 3 nonfocal SKIN warm and dry, no rashes   Results for orders placed or performed during the hospital encounter of 10/28/19 (from the past 48 hour(s))  CBC with Differential     Status: Abnormal   Collection Time: 10/28/19  3:27 PM  Result Value Ref Range   WBC 11.2 (H) 4.0 - 10.5 K/uL   RBC 3.77 (L) 4.22 - 5.81 MIL/uL   Hemoglobin 10.3 (L) 13.0 - 17.0 g/dL   HCT 33.2 (L) 39 - 52 %   MCV 88.1 80.0 - 100.0 fL   MCH 27.3 26.0 - 34.0 pg   MCHC 31.0 30.0 - 36.0 g/dL   RDW 15.0 11.5 - 15.5 %   Platelets 232 150 - 400 K/uL   nRBC 0.2 0.0 - 0.2 %   Neutrophils Relative % 73 %   Neutro Abs 8.2 (H) 1.7 - 7.7 K/uL    Lymphocytes Relative 15 %   Lymphs Abs 1.7 0.7 - 4.0 K/uL   Monocytes Relative 10 %   Monocytes Absolute 1.1 (H) 0 - 1 K/uL   Eosinophils Relative  1 %   Eosinophils Absolute 0.1 0 - 0 K/uL   Basophils Relative 0 %   Basophils Absolute 0.0 0 - 0 K/uL   Immature Granulocytes 1 %   Abs Immature Granulocytes 0.07 0.00 - 0.07 K/uL    Comment: Performed at [facility], [place] [address], [place], [place] [address]  Protime-INR     Status: None   Collection Time: 10/28/19  3:27 PM  Result Value Ref Range   Prothrombin Time 14.1 11.4 - 15.2 seconds   INR 1.1 0.8 - 1.2    Comment: (NOTE) INR goal varies based on device and disease states. Performed at [facility], [place] [address], [place], [place] [address]   Troponin I (High Sensitivity)     Status: None   Collection Time: 10/28/19  3:27 PM  Result Value Ref Range   Troponin I (High Sensitivity) 14 <18 ng/L    Comment: (NOTE) Elevated high sensitivity troponin I (hsTnI) values and significant  changes across serial measurements may suggest ACS but many other  chronic and acute conditions are known to elevate hsTnI results.  Refer to the "Links" section for chest pain algorithms and additional  guidance. Performed at [facility], [place] [address] [place], [place] [address]   Brain natriuretic peptide     Status: Abnormal   Collection Time: 10/28/19  3:27 PM  Result Value Ref Range   B Natriuretic Peptide 745.1 (H) 0.0 - 100.0 pg/mL    Comment: Performed at [facility], [place] [address], [place], [place] [address]  Lactic acid, plasma     Status: None   Collection Time: 10/28/19  3:27 PM  Result Value Ref Range   Lactic Acid, Venous 0.8 0.5 - 1.9 mmol/L    Comment: Performed at [facility], [place] [address] [place], [place] [address]  Basic metabolic panel     Status: Abnormal   Collection Time: 10/28/19  3:27 PM  Result Value Ref Range    Sodium 143 135 - 145 mmol/L   Potassium 4.8 3.5 - 5.1 mmol/L   Chloride 109 98 - 111 mmol/L   CO2 19 (L) 22 - 32 mmol/L   Glucose, Bld 150 (H) 70 - 99 mg/dL    Comment: Glucose reference range applies only to samples taken after fasting for at least 8 hours.   BUN 37 (H) 8 - 23 mg/dL   Creatinine, Ser 2.76 (H) 0.61 - 1.24 mg/dL   Calcium 7.4 (L) 8.9 - 10.3 mg/dL   GFR calc non Af Amer 22 (L) >60 mL/min   GFR calc Af Amer 26 (L) >60 mL/min   Anion gap 15 5 - 15    Comment: Performed at [facility], [place] [address], [place], [place] [address]  Culture, blood (routine x 2)     Status: None (Preliminary result)   Collection Time: 10/28/19  3:27 PM   Specimen: BLOOD  Result Value Ref Range   Specimen Description      BLOOD LEFT ANTECUBITAL Performed at [facility], [place] [address] [place], [place] [address]    Special Requests      BOTTLES DRAWN AEROBIC AND ANAEROBIC Blood Culture results may not be optimal due to an excessive volume of blood received in culture bottles Performed at [place] [address] [place], [place] [address]    Culture      NO GROWTH 2 DAYS Performed at [facility], [place] [address], [place], [place] [address]    Report Status PENDING   Culture, blood (routine x 2)     Status: None (Preliminary result)   Collection Time: 10/28/19  3:27 PM   Specimen: BLOOD  Result Value Ref Range   Specimen Description      BLOOD RIGHT ANTECUBITAL Performed at [facility], [place] [address] [place], [place] [address]    Special Requests      BOTTLES DRAWN AEROBIC AND ANAEROBIC Blood Culture adequate volume Performed at [place] [address] [place], [place] [address]    Culture      NO GROWTH 2 DAYS Performed at [facility], [place] [address] [place], [place] [address]    Report Status PENDING   SARS Coronavirus 2 by RT PCR (hospital order, performed  in [facility] hospital lab) Nasopharyngeal Nasopharyngeal Swab     Status: None   Collection Time: 10/28/19  3:27 PM   Specimen: Nasopharyngeal Swab  Result Value Ref Range   SARS Coronavirus 2 NEGATIVE NEGATIVE    Comment: (NOTE) SARS-CoV-2 target nucleic acids are NOT DETECTED.  The SARS-CoV-2 RNA is generally detectable in upper and lower respiratory specimens during the acute phase of infection. The lowest concentration of SARS-CoV-2 viral copies this assay can detect is 250 copies / mL. A negative result does not preclude SARS-CoV-2 infection and should not be used as the sole basis for treatment or other patient management decisions.  A negative result may occur with improper specimen collection / handling, submission of specimen other than nasopharyngeal swab, presence of viral mutation(s) within the areas targeted by this assay, and inadequate number of viral copies (<250 copies / mL). A negative result must be combined with clinical observations, patient history, and epidemiological information.  Fact Sheet for Patients:   StrictlyIdeas.no  Fact Sheet for Healthcare Providers: BankingDealers.co.za  This test is not yet approved or  cleared by the [place] FDA and has been authorized for detection and/or diagnosis of SARS-CoV-2 by FDA under an Emergency Use Authorization (EUA).  This EUA will remain in effect (meaning this test can be used) for the duration of the COVID-19 declaration under Section 564(b)(1) of the Act, 21 U.S.C. section 360bbb-3(b)(1), unless the authorization is terminated or revoked sooner.  Performed at [facility], [place] [address], [place], [place] [address]   D-dimer, quantitative (not at [facility])     Status: Abnormal   Collection Time: 10/28/19  3:27 PM  Result Value Ref Range   D-Dimer, Quant 1.10 (H) 0.00 - 0.50 ug/mL-FEU    Comment: (NOTE) At the manufacturer cut-off of  0.50 ug/mL FEU, this assay has been documented to exclude PE with a sensitivity and negative predictive value of 97 to 99%.  At this time, this assay has not been approved by the FDA to exclude DVT/VTE. Results should be correlated with clinical presentation. Performed at [facility], [place] [address], [place], [place] [address]   Troponin I (High Sensitivity)     Status: None   Collection Time: 10/28/19  5:39 PM  Result Value Ref Range   Troponin I (High Sensitivity) 13 <18 ng/L    Comment: (NOTE) Elevated high sensitivity troponin I (hsTnI) values and significant  changes across serial measurements may suggest ACS but many other  chronic and acute conditions are known to elevate hsTnI results.  Refer to the "Links" section for chest pain algorithms and additional  guidance. Performed at [facility], [place] [address] [place], [place] [address]   CBG monitoring, ED     Status: Abnormal   Collection Time: 10/28/19 11:41 PM  Result Value Ref Range   Glucose-Capillary 220 (H) 70 - 99 mg/dL    Comment: Glucose reference range applies only to samples taken after fasting for at least 8 hours.  Hemoglobin A1c     Status: Abnormal   Collection Time: 10/29/19  5:35 AM  Result Value Ref Range   Hgb A1c MFr Bld 7.1 (H) 4.8 - 5.6 %    Comment: (NOTE) Pre diabetes:          5.7%-6.4%  Diabetes:              >6.4%  Glycemic control for   <7.0% adults with diabetes    Mean Plasma Glucose 157.07 mg/dL    Comment: Performed at [place] [address] [place], [place] [address]  Glucose, capillary     Status: Abnormal   Collection Time: 10/29/19  7:18 AM  Result Value Ref Range   Glucose-Capillary 125 (H) 70 - 99 mg/dL    Comment: Glucose reference range applies only to samples taken after fasting for at least 8 hours.  HIV Antibody (routine testing w rflx)     Status: None   Collection Time: 10/29/19  7:58 AM  Result Value Ref Range    HIV Screen 4th Generation wRfx Non Reactive Non Reactive    Comment: Performed at [facility], [address], [place], [place] [address]  Comprehensive metabolic panel     Status: Abnormal   Collection Time: 10/29/19  7:58 AM  Result Value Ref Range   Sodium 141 135 - 145 mmol/L   Potassium 4.6 3.5 - 5.1 mmol/L   Chloride 108 98 - 111 mmol/L   CO2 23 22 - 32 mmol/L   Glucose, Bld 145 (H) 70 - 99 mg/dL    Comment: Glucose reference range applies only to samples taken after fasting for at least 8 hours.   BUN 34 (H) 8 - 23 mg/dL   Creatinine, Ser 2.97 (H) 0.61 - 1.24 mg/dL   Calcium 7.0 (L) 8.9 - 10.3 mg/dL   Total Protein 7.6 6.5 - 8.1 g/dL   Albumin 3.4 (L) 3.5 - 5.0 g/dL   AST 16 15 - 41 U/L   ALT 18 0 - 44 U/L   Alkaline Phosphatase 66 38 - 126 U/L   Total Bilirubin 0.9 0.3 - 1.2 mg/dL   GFR calc non Af Amer 21 (L) >60 mL/min   GFR calc Af Amer 24 (L) >60 mL/min   Anion gap 10 5 - 15    Comment: Performed at [facility], [place] [address] [place], [place] [address]  CBC     Status: Abnormal   Collection Time: 10/29/19  7:58 AM  Result Value Ref Range   WBC 10.1 4.0 - 10.5 K/uL   RBC 3.54 (L) 4.22 - 5.81 MIL/uL   Hemoglobin 9.6 (L) 13.0 - 17.0 g/dL   HCT 30.6 (L) 39 - 52 %   MCV 86.4 80.0 - 100.0 fL   MCH 27.1 26.0 - 34.0 pg   MCHC 31.4 30.0 - 36.0 g/dL   RDW 14.8 11.5 - 15.5 %   Platelets 202 150 - 400 K/uL   nRBC 0.0 0.0 - 0.2 %    Comment: Performed at [facility], [place] [address]  [place], [place] [address]  Glucose, capillary     Status: Abnormal   Collection Time: 10/29/19 11:03 AM  Result Value Ref Range   Glucose-Capillary 125 (H) 70 - 99 mg/dL    Comment: Glucose reference range applies only to samples taken after fasting for at least 8 hours.  Glucose, capillary     Status: Abnormal   Collection Time: 10/29/19  4:19 PM  Result Value Ref Range   Glucose-Capillary 174 (H) 70 - 99 mg/dL    Comment: Glucose reference  range applies only to samples taken after fasting for at least 8 hours.  Glucose, capillary     Status: None   Collection Time: 10/29/19  8:45 PM  Result Value Ref Range   Glucose-Capillary 76 70 - 99 mg/dL    Comment: Glucose reference range applies only to samples taken after fasting for at least 8 hours.  Glucose, capillary     Status: Abnormal   Collection Time: 10/30/19 12:37 AM  Result Value Ref Range   Glucose-Capillary 139 (H) 70 - 99 mg/dL    Comment: Glucose reference range applies only to samples taken after fasting for at least 8 hours.  Glucose, capillary     Status: Abnormal   Collection Time: 10/30/19  7:27 AM  Result Value Ref Range   Glucose-Capillary 126 (H) 70 - 99 mg/dL    Comment: Glucose reference range applies only to samples taken after fasting for at least 8 hours.   Comment 1 Notify RN   Glucose, capillary     Status: Abnormal   Collection Time: 10/30/19 11:15 AM  Result Value Ref Range   Glucose-Capillary 157 (H) 70 - 99 mg/dL    Comment: Glucose reference range applies only to samples taken after fasting for at least 8 hours.   Comment 1 Notify RN     CT HEAD WO CONTRAST  Result Date: 10/28/2019 CLINICAL DATA:  Dizziness, intermittent nausea and vomiting EXAM: CT HEAD WITHOUT CONTRAST TECHNIQUE: Contiguous axial images were obtained from the base of the skull through the vertex without intravenous contrast. COMPARISON:  03/07/2017 FINDINGS: Brain: Chronic small vessel ischemic changes within the periventricular white matter most pronounced in the right frontal lobe. No acute infarct or hemorrhage. Lateral ventricles and remaining midline structures are unremarkable. No acute extra-axial fluid collections. No mass effect. Vascular: No hyperdense vessel or unexpected calcification. Skull: Normal. Negative for fracture or focal lesion. Sinuses/Orbits: No acute finding. Other: None. IMPRESSION: 1. Stable head CT, no acute process. Electronically Signed   By: Randa Ngo M.D.   On: 10/28/2019 19:07   US RENAL  Result Date: 10/30/2019 CLINICAL DATA:  Acute kidney injury, hypertension, type II diabetes mellitus, history CHF EXAM: RENAL / URINARY TRACT ULTRASOUND COMPLETE COMPARISON:  CT abdomen and pelvis 12/16/2018 FINDINGS: Right Kidney: Renal measurements: 9.9 x 4.7 x 4.2 cm = volume: 102 mL. Normal cortical thickness. Increased cortical echogenicity. No mass, hydronephrosis or shadowing calcification identified. Left Kidney: Renal measurements: 11.5 x 4.9 x 5.0 cm = volume: 150 mL. Normal cortical thickness. Increased cortical echogenicity. No mass, hydronephrosis or shadowing calcification identified. Bladder: Unremarkable Other: Suboptimal visualization of lower pole RIGHT kidney and upper pole LEFT kidney due to bowel gas. IMPRESSION: Medical renal disease changes of both kidneys. No evidence of renal mass or hydronephrosis. Electronically Signed   By: Lavonia Dana M.D.   On: 10/30/2019 09:44   DG Chest Port 1 View  Result Date: 10/28/2019 CLINICAL DATA:  Shortness of breath. EXAM: PORTABLE  CHEST 1 VIEW COMPARISON:  December 15, 2018 FINDINGS: Mild to moderate severity infiltrate is seen within the right lung base. There is a small right pleural effusion. No pneumothorax is identified. There is mild to moderate severity enlargement of the cardiac silhouette. The visualized skeletal structures are unremarkable. IMPRESSION: Mild to moderate severity right basilar infiltrate with a small right pleural effusion. Electronically Signed   By: Virgina Norfolk M.D.   On: 10/28/2019 15:57   ECHOCARDIOGRAM COMPLETE  Result Date: 10/29/2019    ECHOCARDIOGRAM REPORT   Patient Name:   Melvin Mcclure Date of Exam: 10/29/2019 Medical Rec #:  [id number]      Height:       70.0 in Accession #:    [id number]     Weight:       204.0 lb Date of Birth:  [birth year]      BSA:          2.105 m Patient Age:    39 years       BP:           157/79 mmHg Patient Gender: M              HR:            75 bpm. Exam Location:  Inpatient Procedure: 2D Echo, Cardiac Doppler, Color Doppler and Intracardiac            Opacification Agent Indications:    Acute Respiratory Insufficiency 518.82 / R06.89  History:        Patient has prior history of Echocardiogram examinations, most                 recent 05/29/2018. CHF; Risk Factors:Hypertension, Diabetes and                 Dyslipidemia.  Sonographer:    Tiffany Dance Referring Phys: [id number] [place]  1. Left ventricular ejection fraction, by estimation, is 55 to 60%. The left ventricle has normal function. The left ventricle has no regional wall motion abnormalities. There is moderate left ventricular hypertrophy. Left ventricular diastolic parameters are indeterminate.  2. Right ventricular systolic function is normal. The right ventricular size is normal. Tricuspid regurgitation signal is inadequate for assessing PA pressure.  3. The mitral valve is grossly normal. Mild mitral valve regurgitation.  4. The aortic valve is tricuspid, mildly calcified. Aortic valve regurgitation is not visualized.  5. The inferior vena cava is normal in size with greater than 50% respiratory variability, suggesting right atrial pressure of 3 mmHg.  6. Trivial to small circumferential pericardial effusion. FINDINGS  Left Ventricle: Left ventricular ejection fraction, by estimation, is 55 to 60%. The left ventricle has normal function. The left ventricle has no regional wall motion abnormalities. Definity contrast agent was given IV to delineate the left ventricular  endocardial borders. The left ventricular internal cavity size was normal in size. There is moderate left ventricular hypertrophy. Left ventricular diastolic parameters are indeterminate. Right Ventricle: The right ventricular size is normal. No increase in right ventricular wall thickness. Right ventricular systolic function is normal. Tricuspid regurgitation signal is inadequate for assessing PA  pressure. Left Atrium: Left atrial size was normal in size. Right Atrium: Right atrial size was normal in size. Pericardium: Trivial pericardial effusion is present. The pericardial effusion is circumferential. Mitral Valve: The mitral valve is grossly normal. Mild mitral valve regurgitation. Tricuspid Valve: The tricuspid valve is grossly normal. Tricuspid valve regurgitation is mild. Aortic Valve: The aortic valve  is tricuspid. Aortic valve regurgitation is not visualized. There is mild calcification of the aortic valve. Pulmonic Valve: The pulmonic valve was grossly normal. Pulmonic valve regurgitation is trivial. Aorta: The aortic root is normal in size and structure. Venous: The inferior vena cava is normal in size with greater than 50% respiratory variability, suggesting right atrial pressure of 3 mmHg. IAS/Shunts: No atrial level shunt detected by color flow Doppler.  LEFT VENTRICLE PLAX 2D LVIDd:         4.60 cm  Diastology LVIDs:         3.30 cm  LV e' lateral:   8.38 cm/s LV PW:         1.60 cm  LV E/e' lateral: 11.1 LV IVS:        1.40 cm  LV e' medial:    6.85 cm/s LVOT diam:     2.10 cm  LV E/e' medial:  13.5 LV SV:         79 LV SV Index:   38 LVOT Area:     3.46 cm  RIGHT VENTRICLE             IVC RV Basal diam:  3.20 cm     IVC diam: 1.90 cm RV Mid diam:    2.60 cm RV S prime:     12.90 cm/s TAPSE (M-mode): 2.4 cm LEFT ATRIUM              Index       RIGHT ATRIUM           Index LA diam:        4.40 cm  2.09 cm/m  RA Area:     16.00 cm LA Vol (A2C):   106.0 ml 50.36 ml/m RA Volume:   39.20 ml  18.62 ml/m LA Vol (A4C):   56.9 ml  27.03 ml/m LA Biplane Vol: 77.8 ml  36.96 ml/m  AORTIC VALVE LVOT Vmax:   105.00 cm/s LVOT Vmean:  64.600 cm/s LVOT VTI:    0.229 m  AORTA Ao Root diam: 3.10 cm Ao Asc diam:  3.10 cm MITRAL VALVE MV Area (PHT): 2.76 cm    SHUNTS MV Decel Time: 275 msec    Systemic VTI:  0.23 m MV E velocity: 92.70 cm/s  Systemic Diam: 2.10 cm MV A velocity: 56.80 cm/s MV E/A ratio:   1.63 Rozann Lesches MD Electronically signed by Rozann Lesches MD Signature Date/Time: 10/29/2019/2:28:25 PM    Final    VAS [place] LOWER EXTREMITY VENOUS (DVT)  Result Date: 10/29/2019  Lower Venous DVTStudy Indications: Elevated Ddimer.  Risk Factors: None identified. Comparison Study: No prior studies. Performing Technologist: Oliver Hum RVT  Examination Guidelines: A complete evaluation includes B-mode imaging, spectral Doppler, color Doppler, and power Doppler as needed of all accessible portions of each vessel. Bilateral testing is considered an integral part of a complete examination. Limited examinations for reoccurring indications may be performed as noted. The reflux portion of the exam is performed with the patient in reverse Trendelenburg.  +---------+---------------+---------+-----------+----------+--------------+ RIGHT    CompressibilityPhasicitySpontaneityPropertiesThrombus Aging +---------+---------------+---------+-----------+----------+--------------+ CFV      Full           Yes      Yes                                 +---------+---------------+---------+-----------+----------+--------------+ SFJ      Full                                                        +---------+---------------+---------+-----------+----------+--------------+  FV Prox  Full                                                        +---------+---------------+---------+-----------+----------+--------------+ FV Mid   Full                                                        +---------+---------------+---------+-----------+----------+--------------+ FV DistalFull                                                        +---------+---------------+---------+-----------+----------+--------------+ PFV      Full                                                        +---------+---------------+---------+-----------+----------+--------------+ POP      Full           Yes      Yes                                  +---------+---------------+---------+-----------+----------+--------------+ PTV      Full                                                        +---------+---------------+---------+-----------+----------+--------------+ PERO     Full                                                        +---------+---------------+---------+-----------+----------+--------------+   +---------+---------------+---------+-----------+----------+--------------+ LEFT     CompressibilityPhasicitySpontaneityPropertiesThrombus Aging +---------+---------------+---------+-----------+----------+--------------+ CFV      Full           Yes      Yes                                 +---------+---------------+---------+-----------+----------+--------------+ SFJ      Full                                                        +---------+---------------+---------+-----------+----------+--------------+ FV Prox  Full                                                        +---------+---------------+---------+-----------+----------+--------------+  FV Mid   Full                                                        +---------+---------------+---------+-----------+----------+--------------+ FV DistalFull                                                        +---------+---------------+---------+-----------+----------+--------------+ PFV      Full                                                        +---------+---------------+---------+-----------+----------+--------------+ POP      Full           Yes      Yes                                 +---------+---------------+---------+-----------+----------+--------------+ PTV      Full                                                        +---------+---------------+---------+-----------+----------+--------------+ PERO     Full                                                         +---------+---------------+---------+-----------+----------+--------------+     Summary: RIGHT: - There is no evidence of deep vein thrombosis in the lower extremity.  - No cystic structure found in the popliteal fossa.  LEFT: - There is no evidence of deep vein thrombosis in the lower extremity.  - No cystic structure found in the popliteal fossa.  *See table(s) above for measurements and observations. Electronically signed by Deitra Mayo MD on 10/29/2019 at 6:50:22 PM.    Final     Assessment/Plan  1.  CAP: RLL pneumonia.  On doxy and ceftriaxone.  Per primary.  Weaning O2  2.  Acute hypoxic RF: on O2, LE dopplers without evidence of DVT.  D dimer elevated.    3. AKI on CKD IV:  Baseline Cr 2.3, now 2.7-->2.9 in the setting of pneumonia and continued Lasix/ torsemide.  He doesn't have evidence of vol overload and TTE looked pretty good.  Would hold off on further IV Lasix for now and allow pt to drink to thirst.   Renal US without obstruction.  Sending UA.  Adding strict I/O and daily weights.  Suspect septic AKI + increased insensible losses.        4.  HTN: hold diuretics and lisinopril for now  5.  DM II Per primary  Melvin Mcclure 10/30/2019, 12:41 PM

## 2019-10-30 NOTE — Progress Notes (Addendum)
PROGRESS NOTE  Melvin Mcclure DGU:[id number] DOB: 10-Jul-1950 DOA: 10/28/2019 PCP: Nolene Ebbs, MD  HPI/Recap of past 24 hours: Melvin Mcclure is a 69 y.o. male with medical history significant for diastolic HF, hypertension, diabetes mellitus type 2, CKD stage IV, vertigo, presents to the ED complaining of shortness of breath, PND, dizziness and generalized weakness for the past few days.  Patient presents with his wife, who provided most of the history. Roughly about 2 to 3 days ago patient progressively got short of breath, denied any chest pain, any significant coughing, reported shortness of breath worse at nighttime unable to sleep, wife noted some low-grade fever, denied any abdominal pain.  Patient does report history of vertigo with dizziness and occasional nausea/vomiting.  Patient also complained of intermittent diarrhea/constipation.  Patient denies any recent sick contacts, travel history.has received his Covid immunization.  Patient reports compliance to his medication, quit smoking over 20 years ago, drinks alcohol occasionally.  Due to worsening symptoms, patient presented to the ED. In the ED, patient noted to be hypoxic, desaturating to the 80s requiring about 3 L of oxygen to stay above 90%, tachypneic, BP somewhat elevated.  Labs reviewed creatinine around baseline, mild acidosis, leukocytosis, BNP elevated, troponin negative, EKG with some T wave inversions in leads, unchanged from previous, chest x-ray showed possible right lobe infiltrate.  Patient started on ceftriaxone, azithromycin by EDP.  [facility] consulted for admission and further management.    Today, patient denies any new complaints, still reports some shortness of breath, denies any chest pain, abdominal pain, nausea/vomiting, fever/chills.    Assessment/Plan: Principal Problem:   CAP (community acquired pneumonia) Active Problems:   Type 2 diabetes mellitus with stage 3 chronic kidney disease (HCC)    CHF (congestive heart failure) (HCC)   Acute hypoxic respiratory failure Possibly acute on chronic diastolic HF SOB, tachypnea, desaturated to the 80s on room air, likely ?sacral edema on admission BNP elevated-745.1 Troponin negative, EKG with some T wave inversion in inferior leads, unchanged from previous in 2020 Chest x-ray showed right lobe infiltrates Echo showed EF of 55-60%, no RWMA, mod LVH, LV diastolic parameters are indeterminate Hold off on further diuresis based on worsening renal fxn due to CKD as per nephrology Hold home torsemide, lisinopril for now Continue supplemental oxygen as needed, plan to wean off Strict I's and O's, daily weights Telemetry  Possible community-acquired pneumonia Chest x-ray as above D-dimer elevated at 1.1, low suspicion for PE Doppler of bilateral lower extremity neg for DVT BC x2 NGTD Continue ceftriaxone, doxycycline Duo nebs scheduled, as needed, incentive spirometry Supplemental oxygen as needed  CKD stage IV Worsening  Creatinine around baseline, 2.7 UA/UC pending collection Hold home lisinopril, torsemide for now Nephrology consulted, appreciate recs  Daily BMP, avoid nephrotoxics  Anemia of chronic kidney disease Baseline hemoglobin around 10 Daily CBC  Hypertension BP stable Continue home hydralazine, amlodipine, hold lisinopril Monitor closely  Type 2 diabetes mellitus A1c 7.1 SSI, Accu-Cheks, hypoglycemic protocol  History of vertigo Reports associated dizziness, intermittent nausea/vomiting CT head unremarkable PT/OT-apparently has tried Epley's maneuver with PT without any success Scheduled to see ENT I believe Dr. Redmond Baseman next week follow up as outpt Continue meclizine Fall precautions  Prolonged QTC Avoid QTC prolonging medications Frequent EKG        Malnutrition Type:  Nutrition Problem: Inadequate oral intake Etiology: acute illness (CAP)   Malnutrition  Characteristics:  Signs/Symptoms: meal completion < 50%   Nutrition Interventions:  Interventions: Ensure Enlive (each supplement provides  350kcal and 20 grams of protein), MVI    Estimated body mass index is 28.65 kg/m as calculated from the following:   Height as of this encounter: 5\' 11"  (1.803 m).   Weight as of this encounter: 93.2 kg.     Code Status: Full  Family Communication: Discussed with wife at bedside on 10/28/2019  Disposition Plan: Status is: Inpatient  Remains inpatient appropriate because:Inpatient level of care appropriate due to severity of illness   Dispo: The patient is from: Home              Anticipated d/c is to: Home              Anticipated d/c date is: 2 days              Patient currently is not medically stable to d/c.    Consultants:  Nephrology   Procedures:  None  Antimicrobials:  Ceftriaxone  Doxycycline  DVT prophylaxis: Heparin [place]   Objective: Vitals:   10/30/19 0531 10/30/19 0757 10/30/19 0924 10/30/19 1430  BP: 137/72 (!) 164/81  128/61  Pulse: 80 79  78  Resp: 19 14  16   Temp: 98.6 F (37 C) 98.6 F (37 C)  98.7 F (37.1 C)  TempSrc: Oral Oral  Oral  SpO2: 93% 97% (!) 89% 92%  Weight:      Height:        Intake/Output Summary (Last 24 hours) at 10/30/2019 1533 Last data filed at 10/30/2019 1219 Gross per 24 hour  Intake 1583.06 ml  Output 2002 ml  Net -418.94 ml   Filed Weights   10/29/19 1032  Weight: 93.2 kg    Exam:  General: NAD   Cardiovascular: S1, S2 present  Respiratory:  Diminished breath sounds bilaterally  Abdomen: Soft, nontender, nondistended, bowel sounds present  Musculoskeletal: No bilateral pedal edema noted  Skin: Normal  Psychiatry: Normal mood   Data Reviewed: CBC: Recent Labs  Lab 10/28/19 1527 10/29/19 0758  WBC 11.2* 10.1  NEUTROABS 8.2*  --   HGB 10.3* 9.6*  HCT 33.2* 30.6*  MCV 88.1 86.4  PLT 232 902   Basic Metabolic Panel: Recent Labs  Lab  10/28/19 1527 10/29/19 0758  NA 143 141  K 4.8 4.6  CL 109 108  CO2 19* 23  GLUCOSE 150* 145*  BUN 37* 34*  CREATININE 2.76* 2.97*  CALCIUM 7.4* 7.0*   GFR: Estimated Creatinine Clearance: 27.4 mL/min (A) (by C-G formula based on SCr of 2.97 mg/dL (H)). Liver Function Tests: Recent Labs  Lab 10/29/19 0758  AST 16  ALT 18  ALKPHOS 66  BILITOT 0.9  PROT 7.6  ALBUMIN 3.4*   No results for input(s): LIPASE, AMYLASE in the last 168 hours. No results for input(s): AMMONIA in the last 168 hours. Coagulation Profile: Recent Labs  Lab 10/28/19 1527  INR 1.1   Cardiac Enzymes: No results for input(s): CKTOTAL, CKMB, CKMBINDEX, TROPONINI in the last 168 hours. BNP (last 3 results) No results for input(s): PROBNP in the last 8760 hours. HbA1C: Recent Labs    10/29/19 0535  HGBA1C 7.1*   CBG: Recent Labs  Lab 10/29/19 1619 10/29/19 2045 10/30/19 0037 10/30/19 0727 10/30/19 1115  GLUCAP 174* 76 139* 126* 157*   Lipid Profile: No results for input(s): CHOL, HDL, LDLCALC, TRIG, CHOLHDL, LDLDIRECT in the last 72 hours. Thyroid Function Tests: No results for input(s): TSH, T4TOTAL, FREET4, T3FREE, THYROIDAB in the last 72 hours. Anemia Panel: No results for input(s):  VITAMINB12, FOLATE, FERRITIN, TIBC, IRON, RETICCTPCT in the last 72 hours. Urine analysis:    Component Value Date/Time   COLORURINE YELLOW 03/07/2017 1413   APPEARANCEUR CLEAR 03/07/2017 1413   LABSPEC 1.015 03/07/2017 1413   PHURINE 5.0 03/07/2017 1413   GLUCOSEU >=500 (A) 03/07/2017 1413   HGBUR MODERATE (A) 03/07/2017 1413   BILIRUBINUR NEGATIVE 03/07/2017 1413   KETONESUR 5 (A) 03/07/2017 1413   PROTEINUR 100 (A) 03/07/2017 1413   UROBILINOGEN 0.2 06/17/2010 1620   NITRITE NEGATIVE 03/07/2017 1413   LEUKOCYTESUR NEGATIVE 03/07/2017 1413   Sepsis Labs: @LABRCNTIP (procalcitonin:4,lacticidven:4)  ) Recent Results (from the past 240 hour(s))  Culture, blood (routine x 2)     Status: None  (Preliminary result)   Collection Time: 10/28/19  3:27 PM   Specimen: BLOOD  Result Value Ref Range Status   Specimen Description   Final    BLOOD LEFT ANTECUBITAL Performed at [facility], [place] [address] [place], [place] [address]    Special Requests   Final    BOTTLES DRAWN AEROBIC AND ANAEROBIC Blood Culture results may not be optimal due to an excessive volume of blood received in culture bottles Performed at [facility] [address], [place], [place] [address]    Culture   Final    NO GROWTH 2 DAYS Performed at [facility] [address], [place], [place] [address]    Report Status PENDING  Incomplete  Culture, blood (routine x 2)     Status: None (Preliminary result)   Collection Time: 10/28/19  3:27 PM   Specimen: BLOOD  Result Value Ref Range Status   Specimen Description   Final    BLOOD RIGHT ANTECUBITAL Performed at [facility] [address], [place], [place] [address]    Special Requests   Final    BOTTLES DRAWN AEROBIC AND ANAEROBIC Blood Culture adequate volume Performed at [facility] [address], [place], [place] [address]    Culture   Final    NO GROWTH 2 DAYS Performed at [facility] [address] [place], [place] [address]    Report Status PENDING  Incomplete  SARS Coronavirus 2 by RT PCR (hospital order, performed in [facility] hospital lab) Nasopharyngeal Nasopharyngeal Swab     Status: None   Collection Time: 10/28/19  3:27 PM   Specimen: Nasopharyngeal Swab  Result Value Ref Range Status   SARS Coronavirus 2 NEGATIVE NEGATIVE Final    Comment: (NOTE) SARS-CoV-2 target nucleic acids are NOT DETECTED.  The SARS-CoV-2 RNA is generally detectable in upper and lower respiratory specimens during the acute phase of infection. The lowest concentration of SARS-CoV-2 viral copies this assay can detect is 250 copies / mL. A negative result does not  preclude SARS-CoV-2 infection and should not be used as the sole basis for treatment or other patient management decisions.  A negative result may occur with improper specimen collection / handling, submission of specimen other than nasopharyngeal swab, presence of viral mutation(s) within the areas targeted by this assay, and inadequate number of viral copies (<250 copies / mL). A negative result must be combined with clinical observations, patient history, and epidemiological information.  Fact Sheet for Patients:   StrictlyIdeas.no  Fact Sheet for Healthcare Providers: BankingDealers.co.za  This test is not yet approved or  cleared by the [place] FDA and has been authorized for detection and/or diagnosis of SARS-CoV-2 by FDA under an Emergency Use Authorization (  EUA).  This EUA will remain in effect (meaning this test can be used) for the duration of the COVID-19 declaration under Section 564(b)(1) of the Act, 21 U.S.C. section 360bbb-3(b)(1), unless the authorization is terminated or revoked sooner.  Performed at [facility], [place] [address], [place], [place] [address]       Studies: US RENAL  Result Date: 10/30/2019 CLINICAL DATA:  Acute kidney injury, hypertension, type II diabetes mellitus, history CHF EXAM: RENAL / URINARY TRACT ULTRASOUND COMPLETE COMPARISON:  CT abdomen and pelvis 12/16/2018 FINDINGS: Right Kidney: Renal measurements: 9.9 x 4.7 x 4.2 cm = volume: 102 mL. Normal cortical thickness. Increased cortical echogenicity. No mass, hydronephrosis or shadowing calcification identified. Left Kidney: Renal measurements: 11.5 x 4.9 x 5.0 cm = volume: 150 mL. Normal cortical thickness. Increased cortical echogenicity. No mass, hydronephrosis or shadowing calcification identified. Bladder: Unremarkable Other: Suboptimal visualization of lower pole RIGHT kidney and upper pole LEFT kidney due to bowel gas.  IMPRESSION: Medical renal disease changes of both kidneys. No evidence of renal mass or hydronephrosis. Electronically Signed   By: Lavonia Dana M.D.   On: 10/30/2019 09:44    Scheduled Meds: . amLODipine  10 mg Oral Daily  . aspirin EC  81 mg Oral Daily  . doxycycline  100 mg Oral Q12H  . feeding supplement (ENSURE ENLIVE)  237 mL Oral BID BM  . heparin  5,000 Units Subcutaneous Q8H  . hydrALAZINE  25 mg Oral Q8H  . insulin aspart  0-9 Units Subcutaneous TID WC  . ipratropium-albuterol  3 mL Nebulization BID  . meclizine  25 mg Oral TID  . metoprolol succinate  25 mg Oral Daily  . multivitamin with minerals  1 tablet Oral Daily  . sodium chloride flush  3 mL Intravenous Q12H  . traZODone  50 mg Oral Once    Continuous Infusions: . cefTRIAXone (ROCEPHIN)  IV 1 g (10/29/19 1726)     LOS: 2 days     Alma Friendly, MD [facility]  If 7PM-7AM, please contact night-coverage [email or web address] 10/30/2019, 3:33 PM

## 2019-10-31 LAB — GLUCOSE, CAPILLARY
Glucose-Capillary: 118 mg/dL — ABNORMAL HIGH (ref 70–99)
Glucose-Capillary: 121 mg/dL — ABNORMAL HIGH (ref 70–99)
Glucose-Capillary: 124 mg/dL — ABNORMAL HIGH (ref 70–99)
Glucose-Capillary: 173 mg/dL — ABNORMAL HIGH (ref 70–99)
Glucose-Capillary: 85 mg/dL (ref 70–99)

## 2019-10-31 LAB — BASIC METABOLIC PANEL
Anion gap: 12 (ref 5–15)
BUN: 35 mg/dL — ABNORMAL HIGH (ref 8–23)
CO2: 23 mmol/L (ref 22–32)
Calcium: 6.9 mg/dL — ABNORMAL LOW (ref 8.9–10.3)
Chloride: 106 mmol/L (ref 98–111)
Creatinine, Ser: 2.71 mg/dL — ABNORMAL HIGH (ref 0.61–1.24)
GFR calc Af Amer: 27 mL/min — ABNORMAL LOW (ref >60)
GFR calc non Af Amer: 23 mL/min — ABNORMAL LOW (ref >60)
Glucose, Bld: 134 mg/dL — ABNORMAL HIGH (ref 70–99)
Potassium: 4.3 mmol/L (ref 3.5–5.1)
Sodium: 141 mmol/L (ref 135–145)

## 2019-10-31 LAB — CBC WITH DIFFERENTIAL/PLATELET
Abs Immature Granulocytes: 0.03 10*3/uL (ref 0.00–0.07)
Basophils Absolute: 0.1 10*3/uL (ref 0.0–0.1)
Basophils Relative: 1 %
Eosinophils Absolute: 0.5 10*3/uL (ref 0.0–0.5)
Eosinophils Relative: 5 %
HCT: 31.3 % — ABNORMAL LOW (ref 39.0–52.0)
Hemoglobin: 9.9 g/dL — ABNORMAL LOW (ref 13.0–17.0)
Immature Granulocytes: 0 %
Lymphocytes Relative: 28 %
Lymphs Abs: 2.6 10*3/uL (ref 0.7–4.0)
MCH: 27 pg (ref 26.0–34.0)
MCHC: 31.6 g/dL (ref 30.0–36.0)
MCV: 85.5 fL (ref 80.0–100.0)
Monocytes Absolute: 0.9 10*3/uL (ref 0.1–1.0)
Monocytes Relative: 9 %
Neutro Abs: 5.3 10*3/uL (ref 1.7–7.7)
Neutrophils Relative %: 57 %
Platelets: 244 10*3/uL (ref 150–400)
RBC: 3.66 MIL/uL — ABNORMAL LOW (ref 4.22–5.81)
RDW: 14.5 % (ref 11.5–15.5)
WBC: 9.4 10*3/uL (ref 4.0–10.5)
nRBC: 0 % (ref 0.0–0.2)

## 2019-10-31 LAB — URINE CULTURE: Culture: NO GROWTH

## 2019-10-31 NOTE — Progress Notes (Signed)
PROGRESS NOTE  Melvin Mcclure LZJ:[id number] DOB: May 23, 1950 DOA: 10/28/2019 PCP: Nolene Ebbs, MD  HPI/Recap of past 24 hours: Melvin Mcclure is a 69 y.o. male with medical history significant for diastolic HF, hypertension, diabetes mellitus type 2, CKD stage IV, vertigo, presents to the ED complaining of shortness of breath, PND, dizziness and generalized weakness for the past few days.  Patient presents with his wife, who provided most of the history. Roughly about 2 to 3 days ago patient progressively got short of breath, denied any chest pain, any significant coughing, reported shortness of breath worse at nighttime unable to sleep, wife noted some low-grade fever, denied any abdominal pain.  Patient does report history of vertigo with dizziness and occasional nausea/vomiting.  Patient also complained of intermittent diarrhea/constipation.  Patient denies any recent sick contacts, travel history.has received his Covid immunization.  Patient reports compliance to his medication, quit smoking over 20 years ago, drinks alcohol occasionally.  Due to worsening symptoms, patient presented to the ED. In the ED, patient noted to be hypoxic, desaturating to the 80s requiring about 3 L of oxygen to stay above 90%, tachypneic, BP somewhat elevated.  Labs reviewed creatinine around baseline, mild acidosis, leukocytosis, BNP elevated, troponin negative, EKG with some T wave inversions in leads, unchanged from previous, chest x-ray showed possible right lobe infiltrate.  Patient started on ceftriaxone, azithromycin by EDP.  [facility] hospital[facility] consulted for admission and further management.      Today, patient denies any worsening shortness of breath, denies any chest pain, abdominal pain, patient did report feeling nauseous after an episode of vertigo this a.m. Denies any fever/chills      Assessment/Plan: Principal Problem:   CAP (community acquired pneumonia) Active Problems:   Type 2 diabetes  mellitus with stage 3 chronic kidney disease (HCC)   CHF (congestive heart failure) (HCC)   Acute hypoxic respiratory failure Possibly acute on chronic diastolic HF Currently on room air SOB, tachypnea, desaturated to the 80s on room air, likely ?sacral edema on admission BNP elevated-745.1 Troponin negative, EKG with some T wave inversion in inferior leads, unchanged from previous in 2020 Chest x-ray showed right lobe infiltrates Echo showed EF of 55-60%, no RWMA, mod LVH, LV diastolic parameters are indeterminate Hold off on further diuresis based on worsening renal fxn due to CKD as per nephrology Hold home torsemide, lisinopril for now Continue supplemental oxygen as needed, plan to wean off Strict I's and O's, daily weights Telemetry  Possible community-acquired pneumonia Chest x-ray as above D-dimer elevated at 1.1, low suspicion for PE Doppler of bilateral lower extremity neg for DVT BC x2 NGTD Continue ceftriaxone, doxycycline Duo nebs scheduled, as needed, incentive spirometry Supplemental oxygen as needed  CKD stage IV Creatinine around baseline, 2.7 UA/UC unremarkable for infection, protein noted Hold home lisinopril, torsemide for now Nephrology consulted, appreciate recs  Daily BMP, avoid nephrotoxics  Anemia of chronic kidney disease Baseline hemoglobin around 10 Daily CBC  Hypertension BP stable Continue home hydralazine, amlodipine, hold lisinopril Monitor closely  Type 2 diabetes mellitus A1c 7.1 SSI, Accu-Cheks, hypoglycemic protocol  History of vertigo Reports associated dizziness, intermittent nausea/vomiting CT head unremarkable PT/OT-apparently has tried Epley's maneuver with PT without any success Scheduled to see ENT I believe Dr. Redmond Baseman, follow up as outpt Continue meclizine Fall precautions  Prolonged QTC Avoid QTC prolonging medications Frequent EKG        Malnutrition Type:  Nutrition Problem: Inadequate oral  intake Etiology: acute illness (CAP)   Malnutrition Characteristics:  Signs/Symptoms: meal completion < 50%   Nutrition Interventions:  Interventions: Ensure Enlive (each supplement provides 350kcal and 20 grams of protein), MVI    Estimated body mass index is 28.37 kg/m as calculated from the following:   Height as of this encounter: 5\' 11"  (1.803 m).   Weight as of this encounter: 92.3 kg.     Code Status: Full  Family Communication: Discussed with wife at bedside on 10/28/2019  Disposition Plan: Status is: Inpatient  Remains inpatient appropriate because:Inpatient level of care appropriate due to severity of illness   Dispo: The patient is from: Home              Anticipated d/c is to: Home              Anticipated d/c date is: 2 days              Patient currently is not medically stable to d/c.    Consultants:  Nephrology   Procedures:  None  Antimicrobials:  Ceftriaxone  Doxycycline  DVT prophylaxis: Heparin [place]   Objective: Vitals:   10/30/19 1430 10/30/19 2006 10/30/19 2037 10/31/19 0422  BP: 128/61 (!) 151/71  (!) 161/84  Pulse: 78 77  77  Resp: 16 18  18   Temp: 98.7 F (37.1 C) 98.4 F (36.9 C)  98.3 F (36.8 C)  TempSrc: Oral Oral  Oral  SpO2: 92% 96% 96% 92%  Weight:    92.3 kg  Height:        Intake/Output Summary (Last 24 hours) at 10/31/2019 1114 Last data filed at 10/31/2019 1005 Gross per 24 hour  Intake 280.98 ml  Output 477 ml  Net -196.02 ml   Filed Weights   10/29/19 1032 10/31/19 0422  Weight: 93.2 kg 92.3 kg    Exam:  General: NAD   Cardiovascular: S1, S2 present  Respiratory:  Diminished breath sounds bilaterally  Abdomen: Soft, nontender, nondistended, bowel sounds present  Musculoskeletal: No bilateral pedal edema noted  Skin: Normal  Psychiatry: Normal mood   Data Reviewed: CBC: Recent Labs  Lab 10/28/19 1527 10/29/19 0758 10/31/19 0751  WBC 11.2* 10.1 9.4  NEUTROABS 8.2*  --  5.3  HGB  10.3* 9.6* 9.9*  HCT 33.2* 30.6* 31.3*  MCV 88.1 86.4 85.5  PLT 232 202 353   Basic Metabolic Panel: Recent Labs  Lab 10/28/19 1527 10/29/19 0758 10/31/19 0751  NA 143 141 141  K 4.8 4.6 4.3  CL 109 108 106  CO2 19* 23 23  GLUCOSE 150* 145* 134*  BUN 37* 34* 35*  CREATININE 2.76* 2.97* 2.71*  CALCIUM 7.4* 7.0* 6.9*   GFR: Estimated Creatinine Clearance: 29.9 mL/min (A) (by C-G formula based on SCr of 2.71 mg/dL (H)). Liver Function Tests: Recent Labs  Lab 10/29/19 0758  AST 16  ALT 18  ALKPHOS 66  BILITOT 0.9  PROT 7.6  ALBUMIN 3.4*   No results for input(s): LIPASE, AMYLASE in the last 168 hours. No results for input(s): AMMONIA in the last 168 hours. Coagulation Profile: Recent Labs  Lab 10/28/19 1527  INR 1.1   Cardiac Enzymes: No results for input(s): CKTOTAL, CKMB, CKMBINDEX, TROPONINI in the last 168 hours. BNP (last 3 results) No results for input(s): PROBNP in the last 8760 hours. HbA1C: Recent Labs    10/29/19 0535  HGBA1C 7.1*   CBG: Recent Labs  Lab 10/30/19 0037 10/30/19 0727 10/30/19 1115 10/30/19 2207 10/31/19 0802  GLUCAP 139* 126* 157* 124* 121*   Lipid  Profile: No results for input(s): CHOL, HDL, LDLCALC, TRIG, CHOLHDL, LDLDIRECT in the last 72 hours. Thyroid Function Tests: No results for input(s): TSH, T4TOTAL, FREET4, T3FREE, THYROIDAB in the last 72 hours. Anemia Panel: No results for input(s): VITAMINB12, FOLATE, FERRITIN, TIBC, IRON, RETICCTPCT in the last 72 hours. Urine analysis:    Component Value Date/Time   COLORURINE YELLOW 10/30/2019 2300   APPEARANCEUR CLEAR 10/30/2019 2300   LABSPEC 1.014 10/30/2019 2300   PHURINE 5.0 10/30/2019 2300   GLUCOSEU NEGATIVE 10/30/2019 2300   HGBUR NEGATIVE 10/30/2019 2300   BILIRUBINUR NEGATIVE 10/30/2019 2300   KETONESUR NEGATIVE 10/30/2019 2300   PROTEINUR 100 (A) 10/30/2019 2300   UROBILINOGEN 0.2 06/17/2010 1620   NITRITE NEGATIVE 10/30/2019 2300   LEUKOCYTESUR NEGATIVE  10/30/2019 2300   Sepsis Labs: @LABRCNTIP (procalcitonin:4,lacticidven:4)  ) Recent Results (from the past 240 hour(s))  Culture, blood (routine x 2)     Status: None (Preliminary result)   Collection Time: 10/28/19  3:27 PM   Specimen: BLOOD  Result Value Ref Range Status   Specimen Description   Final    BLOOD LEFT ANTECUBITAL Performed at [facility], [place] [address] [place], [place] [address]    Special Requests   Final    BOTTLES DRAWN AEROBIC AND ANAEROBIC Blood Culture results may not be optimal due to an excessive volume of blood received in culture bottles Performed at [facility] [address], [place], [place] [address]    Culture   Final    NO GROWTH 2 DAYS Performed at [facility] [address], [place], [place] [address]    Report Status PENDING  Incomplete  Culture, blood (routine x 2)     Status: None (Preliminary result)   Collection Time: 10/28/19  3:27 PM   Specimen: BLOOD  Result Value Ref Range Status   Specimen Description   Final    BLOOD RIGHT ANTECUBITAL Performed at [facility] [address], [place], [place] [address]    Special Requests   Final    BOTTLES DRAWN AEROBIC AND ANAEROBIC Blood Culture adequate volume Performed at [facility] [address], [place], [place] [address]    Culture   Final    NO GROWTH 2 DAYS Performed at [facility] [address] [place] [address], [place], [place] [address]    Report Status PENDING  Incomplete  SARS Coronavirus 2 by RT PCR (hospital order, performed in [facility] [place] hospital lab) Nasopharyngeal Nasopharyngeal Swab     Status: None   Collection Time: 10/28/19  3:27 PM   Specimen: Nasopharyngeal Swab  Result Value Ref Range Status   SARS Coronavirus 2 NEGATIVE NEGATIVE Final    Comment: (NOTE) SARS-CoV-2 target nucleic acids are NOT DETECTED.  The SARS-CoV-2 RNA is generally detectable in upper and lower respiratory  specimens during the acute phase of infection. The lowest concentration of SARS-CoV-2 viral copies this assay can detect is 250 copies / mL. A negative result does not preclude SARS-CoV-2 infection and should not be used as the sole basis for treatment or other patient management decisions.  A negative result may occur with improper specimen collection / handling, submission of specimen other than nasopharyngeal swab, presence of viral mutation(s) within the areas targeted by this assay, and inadequate number of viral copies (<250 copies / mL). A negative result must be combined with clinical observations, patient history, and epidemiological information.  Fact Sheet for Patients:   StrictlyIdeas.no  Fact Sheet  for Healthcare Providers: BankingDealers.co.za  This test is not yet approved or  cleared by the [place] and has been authorized for detection and/or diagnosis of SARS-CoV-2 by FDA under an Emergency Use Authorization (EUA).  This EUA will remain in effect (meaning this test can be used) for the duration of the COVID-19 declaration under Section 564(b)(1) of the Act, 21 U.S.C. section 360bbb-3(b)(1), unless the authorization is terminated or revoked sooner.  Performed at [facility], [place] [address], [place], [place] [address]   Urine Culture     Status: None   Collection Time: 10/30/19  7:38 AM   Specimen: Urine, Random  Result Value Ref Range Status   Specimen Description   Final    URINE, RANDOM Performed at [facility] [address], [place], [place] [address]    Special Requests   Final    NONE Performed at [facility], [place] [address] [place], [place] [address]    Culture   Final    NO GROWTH Performed at [facility], [place] [address] [place], [place] [address]    Report Status 10/31/2019 FINAL  Final      Studies: No results  found.  Scheduled Meds: . amLODipine  10 mg Oral Daily  . aspirin EC  81 mg Oral Daily  . doxycycline  100 mg Oral Q12H  . feeding supplement (ENSURE ENLIVE)  237 mL Oral BID BM  . heparin  5,000 Units Subcutaneous Q8H  . hydrALAZINE  25 mg Oral Q8H  . insulin aspart  0-9 Units Subcutaneous TID WC  . meclizine  25 mg Oral TID  . metoprolol succinate  25 mg Oral Daily  . multivitamin with minerals  1 tablet Oral Daily  . sodium chloride flush  3 mL Intravenous Q12H  . traZODone  50 mg Oral Once    Continuous Infusions: . cefTRIAXone (ROCEPHIN)  IV 1 g (10/30/19 1623)     LOS: 3 days     Alma Friendly, MD [facility]  If 7PM-7AM, please contact night-coverage [email or web address] 10/31/2019, 11:14 AM

## 2019-10-31 NOTE — Progress Notes (Signed)
[facility] Progress Note  Subjective: no c/o, no SOB today. Creat down 2.7 this am   Vitals:   10/30/19 2006 10/30/19 2037 10/31/19 0422 10/31/19 1308  BP: (!) 151/71  (!) 161/84 (!) 152/84  Pulse: 77  77 77  Resp: _0 Temp: 98.4 F (36.9 C)  98.3 F (36.8 C) 98.8 F (37.1 C)  TempSrc: Oral  Oral Oral  SpO2: 96% 96% 92% (!) 89%  Weight:   92.3 kg   Height:        Exam: GEN NAD, sitting in chair HEENT EOMI PERRL off O2 NECK no JVD PULM bilat basilar dry rales, R > L CV RRR no m/r/g ABD soft, nontedner EXT no LE edema NEURO AAO x 3 nonfocal SKIN warm and dry, no rashes   TTE 6/26 with no WMA, EF 55-60%, moderate LVH, and > 50% IVC respiratory variability c/w 3 mm Hg pressure  CXR 6.25 - FINDINGS: Mild to moderate severity infiltrate is seen within the right lung base. There is a small right pleural effusion. No pneumothorax is identified. There is mild to moderate severity enlargement of the cardiac silhouette   Renal US 6/27 - 9.9/ 11.5 cm kidneys, no hydro, bilat [name]   6/27 UA - negative, 100 prot   Creat 2.3 in April 2021 at [facility] here 2.3 L in and 3.3 L out, down 1 kg here   BP's high to normal  Assessment/ Plan: 1. AoCKD 3b - b/l creat 2.2- 2.4, eGFR 30- 33m/ min.  Creat up here 2.7- 2.9.  Got IV lasix for suspected vol overload / SOB and is down 1kg and SOB better but lasix on hold now for rising creat.  Creat down today off of lasix, will cont to hold.  2. Acute resp faliure - not sure PNA vs fluid, leaning more towards PNA. Getting IV abx for CAP. 3. HTN - getting home meds x 2 , holding acei 4. DM 2 - per primary     Rob Trinidy Masterson 10/31/2019, 2:21 PM   Recent Labs  Lab 10/29/19 0758 10/31/19 0751  K 4.6 4.3  BUN 34* 35*  CREATININE 2.97* 2.71*  CALCIUM 7.0* 6.9*  HGB 9.6* 9.9*   Inpatient medications: . amLODipine  10 mg Oral Daily  . aspirin EC  81 mg Oral Daily  . doxycycline  100 mg Oral Q12H  . feeding supplement  (ENSURE ENLIVE)  237 mL Oral BID BM  . heparin  5,000 Units Subcutaneous Q8H  . hydrALAZINE  25 mg Oral Q8H  . insulin aspart  0-9 Units Subcutaneous TID WC  . meclizine  25 mg Oral TID  . metoprolol succinate  25 mg Oral Daily  . multivitamin with minerals  1 tablet Oral Daily  . sodium chloride flush  3 mL Intravenous Q12H  . traZODone  50 mg Oral Once   . cefTRIAXone (ROCEPHIN)  IV 1 g (10/30/19 1623)   acetaminophen **OR** acetaminophen, HYDROcodone-acetaminophen, ipratropium-albuterol, ondansetron **OR** ondansetron (ZOFRAN) IV, phenol, senna-docusate

## 2019-10-31 NOTE — Progress Notes (Signed)
SATURATION QUALIFICATIONS: (This note is used to comply with regulatory documentation for home oxygen)  Patient Saturations on Room Air at Rest = 92%  Patient Saturations on Room Air while Ambulating = 88%  Patient Saturations on 1 Liter of oxygen while Ambulating = 92%  Please briefly explain why patient needs home oxygen:  Patient oxygen level drops on ambulation.

## 2019-10-31 NOTE — Progress Notes (Signed)
Occupational Therapy Treatment Patient Details Name: Eaven Schwager MRN: [id number] DOB: [birth year] Today's Date: 10/31/2019    History of present illness 69 y.o. male with medical history significant for diastolic HF, hypertension, diabetes mellitus type 2, CKD stage IV, vertigo, presents to the ED complaining of shortness of breath, PND, dizziness and generalized weakness for the past few days.   OT comments  Pt was complaining of some dizziness. Encouraged pt to stabilize gaze on one object and focus on object when he is feeling dizzy.  Pt agreed   Follow Up Recommendations  No OT follow up    Equipment Recommendations  None recommended by OT           Mobility Bed Mobility Overal bed mobility: Modified Independent                Transfers Overall transfer level: Needs assistance Equipment used: None Transfers: Sit to/from Stand Sit to Stand: Supervision         General transfer comment: supervision for safety due to hx of dizziness (none reported with mobility at this time)        ADL either performed or assessed with clinical judgement   ADL Overall ADL's : Needs assistance/impaired                         Toilet Transfer: Minimal assistance;Ambulation;Cueing for safety;Cueing for sequencing   Toileting- Clothing Manipulation and Hygiene: Minimal assistance;Sit to/from stand;Cueing for safety;Cueing for compensatory techniques         General ADL Comments: pt was complaining of nausea this OT visit.  Wife not present.  Pt frustrated and wants to feel better.               Cognition Arousal/Alertness: Awake/alert Behavior During Therapy: WFL for tasks assessed/performed Overall Cognitive Status: Within Functional Limits for tasks assessed                                               Frequency  Min 2X/week        Progress Toward Goals  OT Goals(current goals can now be found in the care plan section)   Progress towards OT goals: Progressing toward goals     Plan Discharge plan remains appropriate       AM-PAC OT "6 Clicks" Daily Activity     Outcome Measure   Help from another person eating meals?: None Help from another person taking care of personal grooming?: A Little Help from another person toileting, which includes using toliet, bedpan, or urinal?: A Little Help from another person bathing (including washing, rinsing, drying)?: A Little Help from another person to put on and taking off regular upper body clothing?: None Help from another person to put on and taking off regular lower body clothing?: A Little 6 Click Score: 20    End of Session Equipment Utilized During Treatment: Rolling walker  OT Visit Diagnosis: Unsteadiness on feet (R26.81);Muscle weakness (generalized) (M62.81)   Activity Tolerance Patient tolerated treatment well   Patient Left in chair;with family/visitor present   Nurse Communication Mobility status        Time: 1640-1705 OT Time Calculation (min): 25 min  Charges: OT General Charges $OT Visit: 1 Visit OT Treatments $Self Care/Home Management : 23-37 mins  Kari Baars, OT Acute Rehabilitation Services [phone] Office-  [place]      [place], Thereasa Parkin 10/31/2019, 6:49 PM

## 2019-10-31 NOTE — Care Management Important Message (Signed)
Important Message  Patient Details IM Letter given to Dessa Phi RN Case Manager to present to the Patient Name: Melvin Mcclure MRN: [id number] Date of Birth: 01-Jun-1950   Medicare Important Message Given:  Yes     Kerin Salen 10/31/2019, 12:21 PM

## 2019-10-31 NOTE — TOC Initial Note (Signed)
Transition of Care [facility]) - Initial/Assessment Note    Patient Details  Name: Melvin Mcclure MRN: [id number] Date of Birth: 07/04/1950  [facility]) CM/SW Contact:    Dessa Phi, RN Phone Number: 10/31/2019, 12:31 PM  Clinical Narrative: Spoke to patient/spouse in rm about d/c plans-they plan to d/c home.CM referral for otpt PT-referral placed. Patient prefer the [facility] rehab-[address] [facility] location. Will monitor 02 sats if home 02 needed.                 Expected Discharge Plan: OP Rehab Barriers to Discharge: No Barriers Identified   Patient Goals and CMS Choice Patient states their goals for this hospitalization and ongoing recovery are:: go home CMS Medicare.gov Compare Post Acute Care list provided to:: Patient Choice offered to / list presented to : Patient  Expected Discharge Plan and Services Expected Discharge Plan: OP Rehab   Discharge Planning Services: CM Consult Post Acute Care Choice: NA Living arrangements for the past 2 months: Single Family Home                                      Prior Living Arrangements/Services Living arrangements for the past 2 months: Single Family Home Lives with:: Spouse Patient language and need for interpreter reviewed:: Yes Do you feel safe going back to the place where you live?: Yes      Need for Family Participation in Patient Care: No (Comment) Care giver support system in place?: Yes (comment)   Criminal Activity/Legal Involvement Pertinent to Current Situation/Hospitalization: No - Comment as needed  Activities of Daily Living Home Assistive Devices/Equipment: Eyeglasses (eyeglasses) ADL Screening (condition at time of admission) Patient's cognitive ability adequate to safely complete daily activities?: Yes Is the patient deaf or have difficulty hearing?: Yes Does the patient have difficulty seeing, even when wearing glasses/contacts?: No Does the patient have difficulty concentrating,  remembering, or making decisions?: No Patient able to express need for assistance with ADLs?: Yes Does the patient have difficulty dressing or bathing?: No Independently performs ADLs?: Yes (appropriate for developmental age) Does the patient have difficulty walking or climbing stairs?: No Weakness of Legs: None Weakness of Arms/Hands: None  Permission Sought/Granted Permission sought to share information with : Case Manager Permission granted to share information with : Yes, Verbal Permission Granted  Share Information with NAME: Case Manager     Permission granted to share info w Relationship: [name] spouse [phone]     Emotional Assessment Appearance:: Appears stated age Attitude/Demeanor/Rapport: Gracious Affect (typically observed): Accepting Orientation: : Oriented to Self, Oriented to Place, Oriented to  Time, Oriented to Situation Alcohol / Substance Use: Not Applicable Psych Involvement: No (comment)  Admission diagnosis:  CAP (community acquired pneumonia) [J18.9] Patient Active Problem List   Diagnosis Date Noted  . CAP (community acquired pneumonia) 10/28/2019  . Hypertensive cardiomyopathy, with heart failure ([place]) 06/15/2018  . Acute diastolic CHF (congestive heart failure) ([place]) 05/28/2018  . Type 2 diabetes mellitus with stage 3 chronic kidney disease ([place]) 05/28/2018  . CKD stage 4 due to type 2 diabetes mellitus ([place]) 05/28/2018  . CHF (congestive heart failure) ([place]) 05/28/2018   PCP:  Nolene Ebbs, MD Pharmacy:   [facility] [address] ([address]), [place] [address] [place] ([place]) [place] [address] Phone: [phone] Fax: [phone]     Social Determinants of Health (SDOH) Interventions  Readmission Risk Interventions No flowsheet data found.

## 2019-11-01 ENCOUNTER — Inpatient Hospital Stay (HOSPITAL_COMMUNITY): Payer: Medicare Other

## 2019-11-01 ENCOUNTER — Encounter (HOSPITAL_COMMUNITY): Payer: Self-pay | Admitting: Internal Medicine

## 2019-11-01 LAB — CBC WITH DIFFERENTIAL/PLATELET
Abs Immature Granulocytes: 0.05 10*3/uL (ref 0.00–0.07)
Basophils Absolute: 0.1 10*3/uL (ref 0.0–0.1)
Basophils Relative: 1 %
Eosinophils Absolute: 0.4 10*3/uL (ref 0.0–0.5)
Eosinophils Relative: 5 %
HCT: 30.7 % — ABNORMAL LOW (ref 39.0–52.0)
Hemoglobin: 9.3 g/dL — ABNORMAL LOW (ref 13.0–17.0)
Immature Granulocytes: 1 %
Lymphocytes Relative: 22 %
Lymphs Abs: 1.8 10*3/uL (ref 0.7–4.0)
MCH: 27 pg (ref 26.0–34.0)
MCHC: 30.3 g/dL (ref 30.0–36.0)
MCV: 89 fL (ref 80.0–100.0)
Monocytes Absolute: 0.7 10*3/uL (ref 0.1–1.0)
Monocytes Relative: 8 %
Neutro Abs: 5 10*3/uL (ref 1.7–7.7)
Neutrophils Relative %: 63 %
Platelets: 233 10*3/uL (ref 150–400)
RBC: 3.45 MIL/uL — ABNORMAL LOW (ref 4.22–5.81)
RDW: 14.6 % (ref 11.5–15.5)
WBC: 7.9 10*3/uL (ref 4.0–10.5)
nRBC: 0 % (ref 0.0–0.2)

## 2019-11-01 LAB — BASIC METABOLIC PANEL
Anion gap: 10 (ref 5–15)
BUN: 38 mg/dL — ABNORMAL HIGH (ref 8–23)
CO2: 24 mmol/L (ref 22–32)
Calcium: 7 mg/dL — ABNORMAL LOW (ref 8.9–10.3)
Chloride: 108 mmol/L (ref 98–111)
Creatinine, Ser: 2.89 mg/dL — ABNORMAL HIGH (ref 0.61–1.24)
GFR calc Af Amer: 25 mL/min — ABNORMAL LOW (ref >60)
GFR calc non Af Amer: 21 mL/min — ABNORMAL LOW (ref >60)
Glucose, Bld: 96 mg/dL (ref 70–99)
Potassium: 5 mmol/L (ref 3.5–5.1)
Sodium: 142 mmol/L (ref 135–145)

## 2019-11-01 LAB — GLUCOSE, CAPILLARY
Glucose-Capillary: 126 mg/dL — ABNORMAL HIGH (ref 70–99)
Glucose-Capillary: 146 mg/dL — ABNORMAL HIGH (ref 70–99)
Glucose-Capillary: 148 mg/dL — ABNORMAL HIGH (ref 70–99)
Glucose-Capillary: 72 mg/dL (ref 70–99)

## 2019-11-01 NOTE — Progress Notes (Signed)
PROGRESS NOTE  Melvin Mcclure ZHY:[id number] DOB: [birth year] DOA: 10/28/2019 PCP: Nolene Ebbs, MD  HPI/Recap of past 24 hours: Melvin Mcclure is a 69 y.o. male with medical history significant for diastolic HF, hypertension, diabetes mellitus type 2, CKD stage IV, vertigo, presents to the ED complaining of shortness of breath, PND, dizziness and generalized weakness for the past few days.  Patient presents with his wife, who provided most of the history. Roughly about 2 to 3 days ago patient progressively got short of breath, denied any chest pain, any significant coughing, reported shortness of breath worse at nighttime unable to sleep, wife noted some low-grade fever, denied any abdominal pain.  Patient does report history of vertigo with dizziness and occasional nausea/vomiting.  Patient also complained of intermittent diarrhea/constipation.  Patient denies any recent sick contacts, travel history.has received his Covid immunization.  Patient reports compliance to his medication, quit smoking over 20 years ago, drinks alcohol occasionally.  Due to worsening symptoms, patient presented to the ED. In the ED, patient noted to be hypoxic, desaturating to the 80s requiring about 3 L of oxygen to stay above 90%, tachypneic, BP somewhat elevated.  Labs reviewed creatinine around baseline, mild acidosis, leukocytosis, BNP elevated, troponin negative, EKG with some T wave inversions in leads, unchanged from previous, chest x-ray showed possible right lobe infiltrate.  Patient started on ceftriaxone, azithromycin by EDP.  [facility] consulted for admission and further management.      Today, patient still requiring oxygen, denies any worsening shortness of breath, cough, abdominal pain, fever/chills.  Patient's main concern is his vertigo and associated nausea and dizziness.      Assessment/Plan: Principal Problem:   CAP (community acquired pneumonia) Active Problems:   Type 2 diabetes  mellitus with stage 3 chronic kidney disease (HCC)   CHF (congestive heart failure) (HCC)   Acute hypoxic respiratory failure Possibly acute on chronic diastolic HF Currently still requiring about 2 L of oxygen SOB, tachypnea, desaturated to the 80s on room air, likely ?sacral edema on admission BNP elevated-745.1 Troponin negative, EKG with some T wave inversion in inferior leads, unchanged from previous in 2020 Chest x-ray showed right lobe infiltrates Echo showed EF of 55-60%, no RWMA, mod LVH, LV diastolic parameters are indeterminate Hold off on further diuresis based on worsening renal fxn due to CKD as per nephrology Hold home torsemide, lisinopril for now Continue supplemental oxygen as needed, plan to wean off Strict I's and O's, daily weights Telemetry  Possible community-acquired pneumonia Chest x-ray as above D-dimer elevated at 1.1, low suspicion for PE Doppler of bilateral lower extremity neg for DVT BC x2 NGTD CT chest done on 11/01/2019 showed large right lower lobe pneumonia with concern for multifocal pneumonia Continue ceftriaxone, doxycycline for now, may consider escalating if patient continues to require more oxygen, or develops a fever Duo nebs scheduled, as needed, incentive spirometry Supplemental oxygen as needed  CKD stage IV Creatinine around baseline, 2.7 UA/UC unremarkable for infection, protein noted Hold home lisinopril, torsemide for now Nephrology consulted, appreciate recs  Daily BMP, avoid nephrotoxics  Anemia of chronic kidney disease Baseline hemoglobin around 10 Daily CBC  Hypertension BP stable Continue home hydralazine, amlodipine, hold lisinopril Monitor closely  Type 2 diabetes mellitus A1c 7.1 SSI, Accu-Cheks, hypoglycemic protocol  History of vertigo Reports associated dizziness, intermittent nausea/vomiting CT head unremarkable CT abdomen/pelvis unremarkable PT/OT-apparently has tried Epley's maneuver with PT  without any success Scheduled to see ENT I believe Dr. Redmond Baseman, called office to see  if patient can be seen in patients, recommended outpatient follow-up Continue meclizine Fall precautions  Prolonged QTC Avoid QTC prolonging medications Frequent EKG        Malnutrition Type:  Nutrition Problem: Inadequate oral intake Etiology: acute illness (CAP)   Malnutrition Characteristics:  Signs/Symptoms: meal completion < 50%   Nutrition Interventions:  Interventions: Ensure Enlive (each supplement provides 350kcal and 20 grams of protein), MVI    Estimated body mass index is 28.37 kg/m as calculated from the following:   Height as of this encounter: 5\' 11"  (1.803 m).   Weight as of this encounter: 92.3 kg.     Code Status: Full  Family Communication: Discussed with wife at bedside on 11/01/2019  Disposition Plan: Status is: Inpatient  Remains inpatient appropriate because:Inpatient level of care appropriate due to severity of illness   Dispo: The patient is from: Home              Anticipated d/c is to: Home              Anticipated d/c date is: 1 day              Patient currently is not medically stable to d/c.    Consultants:  Nephrology   Procedures:  None  Antimicrobials:  Ceftriaxone  Doxycycline  DVT prophylaxis: Heparin [place]   Objective: Vitals:   10/31/19 2116 11/01/19 0541 11/01/19 1247 11/01/19 1834  BP: (!) 159/86 (!) 151/75 140/80 (!) 160/81  Pulse: 74 77 74 71  Resp: 18 19 19    Temp: 98.5 F (36.9 C) 98.4 F (36.9 C) 98.2 F (36.8 C)   TempSrc:   Oral   SpO2: 95% 90% 90% 93%  Weight:      Height:        Intake/Output Summary (Last 24 hours) at 11/01/2019 1913 Last data filed at 11/01/2019 0810 Gross per 24 hour  Intake --  Output 700 ml  Net -700 ml   Filed Weights   10/29/19 1032 10/31/19 0422  Weight: 93.2 kg 92.3 kg    Exam:  General: NAD   Cardiovascular: S1, S2 present  Respiratory:  Diminished breath sounds  bilaterally  Abdomen: Soft, nontender, nondistended, bowel sounds present  Musculoskeletal: No bilateral pedal edema noted  Skin: Normal  Psychiatry: Normal mood   Data Reviewed: CBC: Recent Labs  Lab 10/28/19 1527 10/29/19 0758 10/31/19 0751 11/01/19 0519  WBC 11.2* 10.1 9.4 7.9  NEUTROABS 8.2*  --  5.3 5.0  HGB 10.3* 9.6* 9.9* 9.3*  HCT 33.2* 30.6* 31.3* 30.7*  MCV 88.1 86.4 85.5 89.0  PLT 232 202 244 657   Basic Metabolic Panel: Recent Labs  Lab 10/28/19 1527 10/29/19 0758 10/31/19 0751 11/01/19 0519  NA 143 141 141 142  K 4.8 4.6 4.3 5.0  CL 109 108 106 108  CO2 19* 23 23 24   GLUCOSE 150* 145* 134* 96  BUN 37* 34* 35* 38*  CREATININE 2.76* 2.97* 2.71* 2.89*  CALCIUM 7.4* 7.0* 6.9* 7.0*   GFR: Estimated Creatinine Clearance: 28 mL/min (A) (by C-G formula based on SCr of 2.89 mg/dL (H)). Liver Function Tests: Recent Labs  Lab 10/29/19 0758  AST 16  ALT 18  ALKPHOS 66  BILITOT 0.9  PROT 7.6  ALBUMIN 3.4*   No results for input(s): LIPASE, AMYLASE in the last 168 hours. No results for input(s): AMMONIA in the last 168 hours. Coagulation Profile: Recent Labs  Lab 10/28/19 1527  INR 1.1   Cardiac Enzymes: No  results for input(s): CKTOTAL, CKMB, CKMBINDEX, TROPONINI in the last 168 hours. BNP (last 3 results) No results for input(s): PROBNP in the last 8760 hours. HbA1C: No results for input(s): HGBA1C in the last 72 hours. CBG: Recent Labs  Lab 10/31/19 1628 10/31/19 2113 11/01/19 0806 11/01/19 1145 11/01/19 1631  GLUCAP 173* 85 72 148* 146*   Lipid Profile: No results for input(s): CHOL, HDL, LDLCALC, TRIG, CHOLHDL, LDLDIRECT in the last 72 hours. Thyroid Function Tests: No results for input(s): TSH, T4TOTAL, FREET4, T3FREE, THYROIDAB in the last 72 hours. Anemia Panel: No results for input(s): VITAMINB12, FOLATE, FERRITIN, TIBC, IRON, RETICCTPCT in the last 72 hours. Urine analysis:    Component Value Date/Time   COLORURINE  YELLOW 10/30/2019 2300   APPEARANCEUR CLEAR 10/30/2019 2300   LABSPEC 1.014 10/30/2019 2300   PHURINE 5.0 10/30/2019 2300   GLUCOSEU NEGATIVE 10/30/2019 2300   HGBUR NEGATIVE 10/30/2019 2300   BILIRUBINUR NEGATIVE 10/30/2019 2300   KETONESUR NEGATIVE 10/30/2019 2300   PROTEINUR 100 (A) 10/30/2019 2300   UROBILINOGEN 0.2 06/17/2010 1620   NITRITE NEGATIVE 10/30/2019 2300   LEUKOCYTESUR NEGATIVE 10/30/2019 2300   Sepsis Labs: @LABRCNTIP (procalcitonin:4,lacticidven:4)  ) Recent Results (from the past 240 hour(s))  Culture, blood (routine x 2)     Status: None (Preliminary result)   Collection Time: 10/28/19  3:27 PM   Specimen: BLOOD  Result Value Ref Range Status   Specimen Description   Final    BLOOD LEFT ANTECUBITAL Performed at [facility], [place] [address], [place], [place] [address]    Special Requests   Final    BOTTLES DRAWN AEROBIC AND ANAEROBIC Blood Culture results may not be optimal due to an excessive volume of blood received in culture bottles Performed at [place] [address] [place], [place] [address]    Culture   Final    NO GROWTH 4 DAYS Performed at [facility], [place] [address], [place], [place] [address]    Report Status PENDING  Incomplete  Culture, blood (routine x 2)     Status: None (Preliminary result)   Collection Time: 10/28/19  3:27 PM   Specimen: BLOOD  Result Value Ref Range Status   Specimen Description   Final    BLOOD RIGHT ANTECUBITAL Performed at [place] [address], [place], [place] [address]    Special Requests   Final    BOTTLES DRAWN AEROBIC AND ANAEROBIC Blood Culture adequate volume Performed at [place] [address], [place], [place] [address]    Culture   Final    NO GROWTH 4 DAYS Performed at [facility], [employer] [address] [place], [place] [address]    Report Status PENDING  Incomplete  SARS Coronavirus 2 by RT PCR  (hospital order, performed in [facility] hospital lab) Nasopharyngeal Nasopharyngeal Swab     Status: None   Collection Time: 10/28/19  3:27 PM   Specimen: Nasopharyngeal Swab  Result Value Ref Range Status   SARS Coronavirus 2 NEGATIVE NEGATIVE Final    Comment: (NOTE) SARS-CoV-2 target nucleic acids are NOT DETECTED.  The SARS-CoV-2 RNA is generally detectable in upper and lower respiratory specimens during the acute phase of infection. The lowest concentration of SARS-CoV-2 viral copies this assay can detect is 250 copies / mL. A negative result does not preclude SARS-CoV-2 infection and should not be used as the sole basis for treatment or other patient management decisions.  A negative  result may occur with improper specimen collection / handling, submission of specimen other than nasopharyngeal swab, presence of viral mutation(s) within the areas targeted by this assay, and inadequate number of viral copies (<250 copies / mL). A negative result must be combined with clinical observations, patient history, and epidemiological information.  Fact Sheet for Patients:   StrictlyIdeas.no  Fact Sheet for Healthcare Providers: BankingDealers.co.za  This test is not yet approved or  cleared by the [place] FDA and has been authorized for detection and/or diagnosis of SARS-CoV-2 by FDA under an Emergency Use Authorization (EUA).  This EUA will remain in effect (meaning this test can be used) for the duration of the COVID-19 declaration under Section 564(b)(1) of the Act, 21 U.S.C. section 360bbb-3(b)(1), unless the authorization is terminated or revoked sooner.  Performed at [facility], [place] [address], [place], [place] [address]   Urine Culture     Status: None   Collection Time: 10/30/19  7:38 AM   Specimen: Urine, Random  Result Value Ref Range Status   Specimen Description   Final    URINE,  RANDOM Performed at [place] [address] [place], [place] [address]    Special Requests   Final    NONE Performed at [facility], [place] [address] [place], [place] [address]    Culture   Final    NO GROWTH Performed at [facility], [place] [address] [place], [place] [address]    Report Status 10/31/2019 FINAL  Final      Studies: CT ABDOMEN PELVIS WO CONTRAST  Result Date: 11/01/2019 CLINICAL DATA:  Dyspnea on exertion, nausea, vomiting. EXAM: CT CHEST, ABDOMEN AND PELVIS WITHOUT CONTRAST TECHNIQUE: Multidetector CT imaging of the chest, abdomen and pelvis was performed following the standard protocol without IV contrast. COMPARISON:  Sep 25, 2015.  December 16, 2018. FINDINGS: CT CHEST FINDINGS Cardiovascular: Mild coronary artery calcifications are noted. Atherosclerosis of thoracic aorta is noted without aneurysm formation. Normal cardiac size. No pericardial effusion. Mediastinum/Nodes: No enlarged mediastinal, hilar, or axillary lymph nodes. Thyroid gland, trachea, and esophagus demonstrate no significant findings. Lungs/Pleura: Small bilateral pleural effusions are noted, right greater than left. No pneumothorax is noted. Large right lower lobe airspace opacity is noted concerning for pneumonia. Multiple ground-glass opacities are noted in the left upper and lower lobes concerning for multifocal pneumonia. Mild left posterior basilar subsegmental atelectasis is noted. Musculoskeletal: No chest wall mass or suspicious bone lesions identified. CT ABDOMEN PELVIS FINDINGS Hepatobiliary: No focal liver abnormality is seen. No gallstones, gallbladder wall thickening, or biliary dilatation. Pancreas: Unremarkable. No pancreatic ductal dilatation or surrounding inflammatory changes. Spleen: Normal in size without focal abnormality. Adrenals/Urinary Tract: Adrenal glands are unremarkable. Kidneys are normal, without renal calculi, focal lesion, or  hydronephrosis. Bladder is unremarkable. Stomach/Bowel: Stomach is within normal limits. Appendix appears normal. No evidence of bowel wall thickening, distention, or inflammatory changes. Vascular/Lymphatic: No significant vascular abnormality is noted. Stable 16 mm left periaortic lymph node is noted which most likely is reactive in etiology. Reproductive: Prostate is unremarkable. Other: Stable small fat containing right inguinal hernia is noted. Small fat containing periumbilical hernia is noted. No significant ascites is noted. Musculoskeletal: No acute or significant osseous findings. IMPRESSION: 1. Small bilateral pleural effusions are noted, right greater than left. 2. Large right lower lobe airspace opacity is noted concerning for pneumonia. Multiple ground-glass opacities are noted in the left upper and lower lobes concerning for multifocal pneumonia. 3.  Mild coronary artery calcifications are noted suggesting coronary artery disease. 4. Stable 16 mm left periaortic lymph node is noted which most likely is reactive in etiology. 5. Stable small fat containing right inguinal and periumbilical hernias. Aortic Atherosclerosis (ICD10-I70.0). Electronically Signed   By: Marijo Conception M.D.   On: 11/01/2019 08:59   DG Chest 2 View  Result Date: 11/01/2019 CLINICAL DATA:  Shortness of breath EXAM: CHEST - 2 VIEW COMPARISON:  CT from earlier in the same day. FINDINGS: Cardiac shadow is enlarged but stable. Diffuse airspace opacities are noted bilaterally but worst in the right base similar to that noted on prior CT examination. Associated bilateral effusions are seen. No bony abnormality is seen. IMPRESSION: Multifocal pneumonia worst in the right base with associated effusions. Electronically Signed   By: Inez Catalina M.D.   On: 11/01/2019 11:10   CT CHEST WO CONTRAST  Result Date: 11/01/2019 CLINICAL DATA:  Dyspnea on exertion, nausea, vomiting. EXAM: CT CHEST, ABDOMEN AND PELVIS WITHOUT CONTRAST  TECHNIQUE: Multidetector CT imaging of the chest, abdomen and pelvis was performed following the standard protocol without IV contrast. COMPARISON:  Sep 25, 2015.  December 16, 2018. FINDINGS: CT CHEST FINDINGS Cardiovascular: Mild coronary artery calcifications are noted. Atherosclerosis of thoracic aorta is noted without aneurysm formation. Normal cardiac size. No pericardial effusion. Mediastinum/Nodes: No enlarged mediastinal, hilar, or axillary lymph nodes. Thyroid gland, trachea, and esophagus demonstrate no significant findings. Lungs/Pleura: Small bilateral pleural effusions are noted, right greater than left. No pneumothorax is noted. Large right lower lobe airspace opacity is noted concerning for pneumonia. Multiple ground-glass opacities are noted in the left upper and lower lobes concerning for multifocal pneumonia. Mild left posterior basilar subsegmental atelectasis is noted. Musculoskeletal: No chest wall mass or suspicious bone lesions identified. CT ABDOMEN PELVIS FINDINGS Hepatobiliary: No focal liver abnormality is seen. No gallstones, gallbladder wall thickening, or biliary dilatation. Pancreas: Unremarkable. No pancreatic ductal dilatation or surrounding inflammatory changes. Spleen: Normal in size without focal abnormality. Adrenals/Urinary Tract: Adrenal glands are unremarkable. Kidneys are normal, without renal calculi, focal lesion, or hydronephrosis. Bladder is unremarkable. Stomach/Bowel: Stomach is within normal limits. Appendix appears normal. No evidence of bowel wall thickening, distention, or inflammatory changes. Vascular/Lymphatic: No significant vascular abnormality is noted. Stable 16 mm left periaortic lymph node is noted which most likely is reactive in etiology. Reproductive: Prostate is unremarkable. Other: Stable small fat containing right inguinal hernia is noted. Small fat containing periumbilical hernia is noted. No significant ascites is noted. Musculoskeletal: No acute or  significant osseous findings. IMPRESSION: 1. Small bilateral pleural effusions are noted, right greater than left. 2. Large right lower lobe airspace opacity is noted concerning for pneumonia. Multiple ground-glass opacities are noted in the left upper and lower lobes concerning for multifocal pneumonia. 3. Mild coronary artery calcifications are noted suggesting coronary artery disease. 4. Stable 16 mm left periaortic lymph node is noted which most likely is reactive in etiology. 5. Stable small fat containing right inguinal and periumbilical hernias. Aortic Atherosclerosis (ICD10-I70.0). Electronically Signed   By: Marijo Conception M.D.   On: 11/01/2019 08:59    Scheduled Meds: . amLODipine  10 mg Oral Daily  . aspirin EC  81 mg Oral Daily  . doxycycline  100 mg Oral Q12H  . feeding supplement (ENSURE ENLIVE)  237 mL Oral BID BM  . heparin  5,000 Units Subcutaneous Q8H  . hydrALAZINE  25 mg Oral Q8H  . insulin aspart  0-9 Units Subcutaneous TID WC  .  meclizine  25 mg Oral TID  . metoprolol succinate  25 mg Oral Daily  . multivitamin with minerals  1 tablet Oral Daily  . sodium chloride flush  3 mL Intravenous Q12H  . traZODone  50 mg Oral Once    Continuous Infusions: . cefTRIAXone (ROCEPHIN)  IV 1 g (11/01/19 1532)     LOS: 4 days     Alma Friendly, MD [facility]  If 7PM-7AM, please contact night-coverage [email or web address] 11/01/2019, 7:13 PM

## 2019-11-01 NOTE — Progress Notes (Signed)
[facility] Progress Note  Subjective: still c/o vertigo, nausea   Vitals:   10/31/19 0422 10/31/19 1308 10/31/19 2116 11/01/19 0541  BP: (!) 161/84 (!) 152/84 (!) 159/86 (!) 151/75  Pulse: 77 77 74 77  Resp: '18 18 18 19  '$ Temp: 98.3 F (36.8 C) 98.8 F (37.1 C) 98.5 F (36.9 C) 98.4 F (36.9 C)  TempSrc: Oral Oral    SpO2: 92% (!) 89% 95% 90%  Weight: 92.3 kg     Height:        Exam: GEN NAD, sitting in chair HEENT EOMI PERRL off O2 NECK no JVD PULM improving bibasilar rales, R > L CV RRR no m/r/g ABD soft, nontedner EXT no LE edema NEURO AAO x 3 nonfocal SKIN warm and dry, no rashes   TTE 6/26 with no WMA, EF 55-60%, moderate LVH, and > 50% IVC respiratory variability c/w 3 mm Hg pressure  CXR 6.25 - FINDINGS: Mild to moderate severity infiltrate is seen within the right lung base. There is a small right pleural effusion... There is mild to moderate severity enlargement of the cardiac silhouette   Renal US 6/27 - 9.9/ 11.5 cm kidneys, no hydro, bilat [name]   6/27 UA - negative, 100 prot   Creat 2.3 in April 2021 at [facility]   6/29 CT abd/ chest/ pelvis - IMPRESSION: 1. Small bilateral pleural effusions are noted, right greater than left. 2. Large right lower lobe airspace opacity is noted concerning for pneumonia. Multiple ground-glass opacities are noted in the left upper and lower lobes concerning for multifocal pneumonia. 3. Mild coronary artery calcifications are noted suggesting coronary artery disease. 4. Stable 16 mm left periaortic lymph node is noted which most likely is reactive in etiology. 5. Stable small fat containing right inguinal and periumbilical hernias.  Assessment/ Plan: 1. AoCKD 3b - b/l creat 2.2- 2.4, eGFR 30- 45m/ min.  Creat here 2.7- 2.9.  Got IV lasix for suspected vol overload / SOB and SOB improved, but with rising creatinine and echo results, the Lasix was then held.  Creat stable today at 2.8. will follow.  2. Acute resp failure/  RLL infiltrate - per CT pt has PNA, is getting IV abx and LG temp/ ^[facility].  3. HTN - getting home meds x 2 , holding acei 4. DM 2 - per primary 5. Vertigo - pt's main complaint, defer to primary     Rob Harinder Romas 11/01/2019, 11:49 AM   Recent Labs  Lab 10/31/19 0751 11/01/19 0519  K 4.3 5.0  BUN 35* 38*  CREATININE 2.71* 2.89*  CALCIUM 6.9* 7.0*  HGB 9.9* 9.3*   Inpatient medications: . amLODipine  10 mg Oral Daily  . aspirin EC  81 mg Oral Daily  . doxycycline  100 mg Oral Q12H  . feeding supplement (ENSURE ENLIVE)  237 mL Oral BID BM  . heparin  5,000 Units Subcutaneous Q8H  . hydrALAZINE  25 mg Oral Q8H  . insulin aspart  0-9 Units Subcutaneous TID WC  . meclizine  25 mg Oral TID  . metoprolol succinate  25 mg Oral Daily  . multivitamin with minerals  1 tablet Oral Daily  . sodium chloride flush  3 mL Intravenous Q12H  . traZODone  50 mg Oral Once   . cefTRIAXone (ROCEPHIN)  IV 1 g (10/31/19 1642)   acetaminophen **OR** acetaminophen, HYDROcodone-acetaminophen, ipratropium-albuterol, ondansetron **OR** ondansetron (ZOFRAN) IV, phenol, senna-docusate

## 2019-11-01 NOTE — TOC Progression Note (Signed)
Transition of Care [facility]) - Progression Note    Patient Details  Name: Melvin Mcclure MRN: [id number] Date of Birth: 06-03-1950  [facility] CM/SW Contact  Alper Guilmette, Juliann Pulse, RN Phone Number: 11/01/2019, 1:31 PM  Clinical Narrative:  Spouse requesting labs,scans-left Release of info form in rm for spouse to complete.     Expected Discharge Plan: OP Rehab Barriers to Discharge: No Barriers Identified  Expected Discharge Plan and Services Expected Discharge Plan: OP Rehab   Discharge Planning Services: CM Consult Post Acute Care Choice: NA Living arrangements for the past 2 months: Single Family Home                                       Social Determinants of Health (SDOH) Interventions    Readmission Risk Interventions No flowsheet data found.

## 2019-11-01 NOTE — Progress Notes (Signed)
Pt received scheduled po med. Tolerated well. Will cont to monitor.

## 2019-11-01 NOTE — Plan of Care (Signed)

## 2019-11-01 NOTE — Evaluation (Signed)
Clinical/Bedside Swallow Evaluation Patient Details  Name: Melvin Mcclure MRN: [id number] Date of Birth: Jul 25, 1950  Today's Date: 11/01/2019 Time: SLP Start Time (ACUTE ONLY): 24 SLP Stop Time (ACUTE ONLY): 1635 SLP Time Calculation (min) (ACUTE ONLY): 20 min  Past Medical History:  Past Medical History:  Diagnosis Date  . CHF (congestive heart failure) ([place]) 05/28/2018   dx 05/28/18  . Diabetes mellitus without complication ([place])   . Hyperlipidemia   . Hypertension   . Perforating neurotrophic ulcer of foot ([place])    Past Surgical History:  Past Surgical History:  Procedure Laterality Date  . ACHILLES TENDON REPAIR    . CYST EXCISION     HPI:  69 yo male adm to [facility] with vertigo, nausea/vomiting.  Pt imaging studies are negative except for chronic vascular disease.  Pt PMH + for DM2, CKD, and CHF. He denies dysphagia nor reflux.  Per imaging study, pt found to have right base pna per CXR. Swallow evaluation ordered.   Assessment / Plan / Recommendation Clinical Impression  Patient presents with functional oropharyngeal swallow without clinical indication of aspiration with all po observed.  His CN exam is unremarkable and he easily passed a 3 ounce water challenge.  Pt denies any reflux symptoms nor dysphagia. He states he takes his medications "all at one time" without difficulites. SLP Visit Diagnosis: Dysphagia, unspecified (R13.10)    Aspiration Risk  No limitations    Diet Recommendation Regular;Thin liquid   Liquid Administration via: Cup;Straw Medication Administration: Whole meds with liquid Supervision: Patient able to self feed Compensations: Slow rate;Small sips/bites Postural Changes: Seated upright at 90 degrees;Remain upright for at least 30 minutes after po intake    Other  Recommendations Oral Care Recommendations: Oral care BID   Follow up Recommendations None      Frequency and Duration      n/a      Prognosis   n/a     Swallow Study    General Date of Onset: 11/01/19 HPI: 68 yo male adm to [facility] with vertigo, nausea/vomiting.  Pt imaging studies are negative except for chronic vascular disease.  Pt PMH + for DM2, CKD, and CHF. He denies dysphagia nor reflux.  Per imaging study, pt found to have right base pna per CXR. Swallow evaluation ordered. Type of Study: Bedside Swallow Evaluation Diet Prior to this Study: Regular;Thin liquids Temperature Spikes Noted: No Respiratory Status: Nasal cannula History of Recent Intubation: No Behavior/Cognition: Alert;Cooperative;Pleasant mood Oral Cavity Assessment: Within Functional Limits Oral Care Completed by SLP: No Oral Cavity - Dentition: Adequate natural dentition Vision: Functional for self-feeding Self-Feeding Abilities: Able to feed self Patient Positioning: Upright in chair Baseline Vocal Quality: Normal Volitional Cough: Strong Volitional Swallow: Able to elicit    Oral/Motor/Sensory Function Overall Oral Motor/Sensory Function: Within functional limits   Ice Chips Ice chips: Not tested   Thin Liquid Thin Liquid: Within functional limits Presentation: Cup    Nectar Thick Nectar Thick Liquid: Not tested   Honey Thick Honey Thick Liquid: Not tested   Puree Puree: Within functional limits Presentation: Self Fed;Spoon   Solid     Solid: Not tested      Melvin Mcclure 11/01/2019,5:30 PM   Kathleen Lime, MS [place] Office [phone]

## 2019-11-01 NOTE — Progress Notes (Signed)
Patient has episode of nausea with small amount of emesis. Pt vomited in trash can. Unable to assess appearance emesis. Patient unable to to take scheduled medication at this time. Gave PRN Zofran IV at this time. Will cont to monitor.

## 2019-11-01 NOTE — TOC Progression Note (Signed)
Transition of Care [facility]) - Progression Note    Patient Details  Name: Melvin Mcclure MRN: [id number] Date of Birth: 1950/06/08  Transition of Care [facility]) CM/SW Contact  Fawne Hughley, Juliann Pulse, RN Phone Number: 11/01/2019, 11:32 AM  Clinical Narrative:  Noted 02 sats qualify for home 02-can arrange with home 02 order. [employer] rep Zach following.    Expected Discharge Plan: OP Rehab Barriers to Discharge: No Barriers Identified  Expected Discharge Plan and Services Expected Discharge Plan: OP Rehab   Discharge Planning Services: CM Consult Post Acute Care Choice: NA Living arrangements for the past 2 months: Single Family Home                                       Social Determinants of Health (SDOH) Interventions    Readmission Risk Interventions No flowsheet data found.

## 2019-11-01 NOTE — Progress Notes (Signed)
No more episodes of vomiting. Zofran was effective. Patient resting. Will cont to monitor.

## 2019-11-01 NOTE — Plan of Care (Signed)
  Problem: Education: Goal: Knowledge of General Education information will improve Description Including pain rating scale, medication(s)/side effects and non-pharmacologic comfort measures Outcome: Progressing   Problem: Health Behavior/Discharge Planning: Goal: Ability to manage health-related needs will improve Outcome: Progressing   

## 2019-11-02 LAB — CULTURE, BLOOD (ROUTINE X 2)
Culture: NO GROWTH
Culture: NO GROWTH
Special Requests: ADEQUATE

## 2019-11-02 LAB — CBC WITH DIFFERENTIAL/PLATELET
Abs Immature Granulocytes: 0.11 10*3/uL — ABNORMAL HIGH (ref 0.00–0.07)
Basophils Absolute: 0.1 10*3/uL (ref 0.0–0.1)
Basophils Relative: 1 %
Eosinophils Absolute: 0.4 10*3/uL (ref 0.0–0.5)
Eosinophils Relative: 5 %
HCT: 31.5 % — ABNORMAL LOW (ref 39.0–52.0)
Hemoglobin: 9.6 g/dL — ABNORMAL LOW (ref 13.0–17.0)
Immature Granulocytes: 1 %
Lymphocytes Relative: 23 %
Lymphs Abs: 1.8 10*3/uL (ref 0.7–4.0)
MCH: 26.9 pg (ref 26.0–34.0)
MCHC: 30.5 g/dL (ref 30.0–36.0)
MCV: 88.2 fL (ref 80.0–100.0)
Monocytes Absolute: 0.7 10*3/uL (ref 0.1–1.0)
Monocytes Relative: 8 %
Neutro Abs: 4.8 10*3/uL (ref 1.7–7.7)
Neutrophils Relative %: 62 %
Platelets: 255 10*3/uL (ref 150–400)
RBC: 3.57 MIL/uL — ABNORMAL LOW (ref 4.22–5.81)
RDW: 14.5 % (ref 11.5–15.5)
WBC: 7.8 10*3/uL (ref 4.0–10.5)
nRBC: 0.3 % — ABNORMAL HIGH (ref 0.0–0.2)

## 2019-11-02 LAB — GLUCOSE, CAPILLARY
Glucose-Capillary: 103 mg/dL — ABNORMAL HIGH (ref 70–99)
Glucose-Capillary: 175 mg/dL — ABNORMAL HIGH (ref 70–99)
Glucose-Capillary: 153 mg/dL — ABNORMAL HIGH (ref 70–99)
Glucose-Capillary: 96 mg/dL (ref 70–99)

## 2019-11-02 LAB — BASIC METABOLIC PANEL
Anion gap: 12 (ref 5–15)
BUN: 39 mg/dL — ABNORMAL HIGH (ref 8–23)
CO2: 22 mmol/L (ref 22–32)
Calcium: 6.9 mg/dL — ABNORMAL LOW (ref 8.9–10.3)
Chloride: 106 mmol/L (ref 98–111)
Creatinine, Ser: 2.98 mg/dL — ABNORMAL HIGH (ref 0.61–1.24)
GFR calc Af Amer: 24 mL/min — ABNORMAL LOW (ref >60)
GFR calc non Af Amer: 20 mL/min — ABNORMAL LOW (ref >60)
Glucose, Bld: 119 mg/dL — ABNORMAL HIGH (ref 70–99)
Potassium: 5.2 mmol/L — ABNORMAL HIGH (ref 3.5–5.1)
Sodium: 140 mmol/L (ref 135–145)

## 2019-11-02 MED ORDER — CALCIUM CARBONATE ANTACID 500 MG PO CHEW
1.0000 | CHEWABLE_TABLET | Freq: Every day | ORAL | Status: DC
  Administered 2019-11-02 – 2019-11-05 (×4): 200 mg via ORAL
  Filled 2019-11-02 (×4): qty 1

## 2019-11-02 MED ORDER — FUROSEMIDE 10 MG/ML IJ SOLN
40.0000 mg | Freq: Two times a day (BID) | INTRAMUSCULAR | Status: DC
  Administered 2019-11-02 – 2019-11-04 (×4): 40 mg via INTRAVENOUS
  Filled 2019-11-02 (×4): qty 4

## 2019-11-02 NOTE — Progress Notes (Signed)
PROGRESS NOTE    Melvin Mcclure  PJK:[id number] DOB: Oct 01, 1950 DOA: 10/28/2019 PCP: Nolene Ebbs, MD  Brief Narrative:  19 AAM  DM TY 2 on insulin since 2004 with neuropathy Stage III-IV CKD?  Cardiorenal syndrome followed by Dr. Harrie Jeans of nephrology-previously on home torsemide Diastolic heart failure severe LVH diagnosed 05/2018 Vertigo Admitted 10/28/2019 progressive DOE, S OB, wheeze with low-grade fever Found to be hypoxic 90s requiring 3 L of oxygen EKG showed no changes chest x-ray showed right lobe infiltrate and started on azithromycin ceftriaxone  Assessment & Plan:   Principal Problem:   CAP (community acquired pneumonia) Active Problems:   Type 2 diabetes mellitus with stage 3 chronic kidney disease ([place])   CHF (congestive heart failure) ([place])   1. Multifactorial dyspnea-Small component of heart failure a. Initially received Lasix-because of the rising creatinine nephrology was consulted as below b. diuretics on hold i. CXR and CT scan confirm multifocal pneumonia >heart failure at this time we will continue to hold diuretics I have asked for a standing weight\ ii. Patient understands strict I's and O's c. Combination of pneumonia  i. Pneumonia community-acquired 1. Continue broad-spectrum ceftriaxone and azithromycin since 6/26 would aim for 10 days total treatment 2. Pulmonary hygiene toileting and incentive spirometer every hourly have explained this clearly to patient 2. Diabetes mellitus type 2 a. Blood sugars are well controlled from 10 3-1 26 patient is eating 75% of his meals b. sliding-scale coverage continue sliding scale coverage c.  (home meds include 5 units of Lantus and sliding scale coverage at this time) d. Continue gabapentin on discharge at low-dose 3. CKD 3 prior cardiorenal syndrome 4. Hyperkalemia mild a. Nephrology has seen the patient b. Continue medications although lisinopril 10 been stopped continuing amlodipine 10 c. Repeat  potassium in the morning if above 5.5 may need binders d. Obtain phosphorus in the morning 5. Vertigo a. No specific complaints today see that he is on PTA Reglan   DVT prophylaxis:  Code Status: Full Family Communication: Discussed with wife at the bedside in detail  disposition:   Status is: Inpatient  Remains inpatient appropriate because:Ongoing active pain requiring inpatient pain management and IV treatments appropriate due to intensity of illness or inability to take PO   Dispo:  Patient From: Home  Planned Disposition: Home  Expected discharge date: 11/02/19  Medically stable for discharge: No        Consultants:   Nephrology  Procedures: Multiple CTs  Antimicrobials: Ceftriaxone and doxycycline as above   Subjective: Does not feel well, still quite winded Not close to his norm Able to verbalize but requires some assistance Understands the whole plan and we discussed at length the fact that this is primarily pneumonia more so than his heart failure-all questions answered  Objective: Vitals:   11/01/19 2045 11/02/19 0500 11/02/19 0531 11/02/19 1012  BP: (!) 145/80  (!) 155/78 (!) 163/80  Pulse: 77  75 76  Resp: 20  18 17   Temp: 98.1 F (36.7 C)  99 F (37.2 C)   TempSrc: Oral  Oral   SpO2: 93%  92% (!) 89%  Weight:  96.6 kg    Height:        Intake/Output Summary (Last 24 hours) at 11/02/2019 1040 Last data filed at 11/02/2019 1030 Gross per 24 hour  Intake 861.89 ml  Output --  Net 861.89 ml   Filed Weights   10/29/19 1032 10/31/19 0422 11/02/19 0500  Weight: 93.2 kg 92.3 kg 96.6 kg  Examination:  General exam: Awake alert moderate dentition Respiratory system: Wheeze scarce how although does have some inspiratory rales Mainly posterolaterally Cardiovascular system: S1-S2 no murmur rub or gallop on monitors is in sinus rhythm Gastrointestinal system: Obese nontender no rebound ROM intact. Central nervous system: ROM intact  neurologically intact no focal deficit no lower extremity edema Extremities: No lower extremity edema Skin: No rash Psychiatry: Euthymic pleasant  Data Reviewed: I have personally reviewed following labs and imaging studies Potassium 5.2 BUN/creatinine 39/2.9 Hemoglobin 9.6 platelet 255  Radiology Studies: CT ABDOMEN PELVIS WO CONTRAST  Result Date: 11/01/2019 CLINICAL DATA:  Dyspnea on exertion, nausea, vomiting. EXAM: CT CHEST, ABDOMEN AND PELVIS WITHOUT CONTRAST TECHNIQUE: Multidetector CT imaging of the chest, abdomen and pelvis was performed following the standard protocol without IV contrast. COMPARISON:  Sep 25, 2015.  December 16, 2018. FINDINGS: CT CHEST FINDINGS Cardiovascular: Mild coronary artery calcifications are noted. Atherosclerosis of thoracic aorta is noted without aneurysm formation. Normal cardiac size. No pericardial effusion. Mediastinum/Nodes: No enlarged mediastinal, hilar, or axillary lymph nodes. Thyroid gland, trachea, and esophagus demonstrate no significant findings. Lungs/Pleura: Small bilateral pleural effusions are noted, right greater than left. No pneumothorax is noted. Large right lower lobe airspace opacity is noted concerning for pneumonia. Multiple ground-glass opacities are noted in the left upper and lower lobes concerning for multifocal pneumonia. Mild left posterior basilar subsegmental atelectasis is noted. Musculoskeletal: No chest wall mass or suspicious bone lesions identified. CT ABDOMEN PELVIS FINDINGS Hepatobiliary: No focal liver abnormality is seen. No gallstones, gallbladder wall thickening, or biliary dilatation. Pancreas: Unremarkable. No pancreatic ductal dilatation or surrounding inflammatory changes. Spleen: Normal in size without focal abnormality. Adrenals/Urinary Tract: Adrenal glands are unremarkable. Kidneys are normal, without renal calculi, focal lesion, or hydronephrosis. Bladder is unremarkable. Stomach/Bowel: Stomach is within normal  limits. Appendix appears normal. No evidence of bowel wall thickening, distention, or inflammatory changes. Vascular/Lymphatic: No significant vascular abnormality is noted. Stable 16 mm left periaortic lymph node is noted which most likely is reactive in etiology. Reproductive: Prostate is unremarkable. Other: Stable small fat containing right inguinal hernia is noted. Small fat containing periumbilical hernia is noted. No significant ascites is noted. Musculoskeletal: No acute or significant osseous findings. IMPRESSION: 1. Small bilateral pleural effusions are noted, right greater than left. 2. Large right lower lobe airspace opacity is noted concerning for pneumonia. Multiple ground-glass opacities are noted in the left upper and lower lobes concerning for multifocal pneumonia. 3. Mild coronary artery calcifications are noted suggesting coronary artery disease. 4. Stable 16 mm left periaortic lymph node is noted which most likely is reactive in etiology. 5. Stable small fat containing right inguinal and periumbilical hernias. Aortic Atherosclerosis (ICD10-I70.0). Electronically Signed   By: Marijo Conception M.D.   On: 11/01/2019 08:59   DG Chest 2 View  Result Date: 11/01/2019 CLINICAL DATA:  Shortness of breath EXAM: CHEST - 2 VIEW COMPARISON:  CT from earlier in the same day. FINDINGS: Cardiac shadow is enlarged but stable. Diffuse airspace opacities are noted bilaterally but worst in the right base similar to that noted on prior CT examination. Associated bilateral effusions are seen. No bony abnormality is seen. IMPRESSION: Multifocal pneumonia worst in the right base with associated effusions. Electronically Signed   By: Inez Catalina M.D.   On: 11/01/2019 11:10   CT CHEST WO CONTRAST  Result Date: 11/01/2019 CLINICAL DATA:  Dyspnea on exertion, nausea, vomiting. EXAM: CT CHEST, ABDOMEN AND PELVIS WITHOUT CONTRAST TECHNIQUE: Multidetector CT imaging of the  chest, abdomen and pelvis was performed  following the standard protocol without IV contrast. COMPARISON:  Sep 25, 2015.  December 16, 2018. FINDINGS: CT CHEST FINDINGS Cardiovascular: Mild coronary artery calcifications are noted. Atherosclerosis of thoracic aorta is noted without aneurysm formation. Normal cardiac size. No pericardial effusion. Mediastinum/Nodes: No enlarged mediastinal, hilar, or axillary lymph nodes. Thyroid gland, trachea, and esophagus demonstrate no significant findings. Lungs/Pleura: Small bilateral pleural effusions are noted, right greater than left. No pneumothorax is noted. Large right lower lobe airspace opacity is noted concerning for pneumonia. Multiple ground-glass opacities are noted in the left upper and lower lobes concerning for multifocal pneumonia. Mild left posterior basilar subsegmental atelectasis is noted. Musculoskeletal: No chest wall mass or suspicious bone lesions identified. CT ABDOMEN PELVIS FINDINGS Hepatobiliary: No focal liver abnormality is seen. No gallstones, gallbladder wall thickening, or biliary dilatation. Pancreas: Unremarkable. No pancreatic ductal dilatation or surrounding inflammatory changes. Spleen: Normal in size without focal abnormality. Adrenals/Urinary Tract: Adrenal glands are unremarkable. Kidneys are normal, without renal calculi, focal lesion, or hydronephrosis. Bladder is unremarkable. Stomach/Bowel: Stomach is within normal limits. Appendix appears normal. No evidence of bowel wall thickening, distention, or inflammatory changes. Vascular/Lymphatic: No significant vascular abnormality is noted. Stable 16 mm left periaortic lymph node is noted which most likely is reactive in etiology. Reproductive: Prostate is unremarkable. Other: Stable small fat containing right inguinal hernia is noted. Small fat containing periumbilical hernia is noted. No significant ascites is noted. Musculoskeletal: No acute or significant osseous findings. IMPRESSION: 1. Small bilateral pleural effusions are  noted, right greater than left. 2. Large right lower lobe airspace opacity is noted concerning for pneumonia. Multiple ground-glass opacities are noted in the left upper and lower lobes concerning for multifocal pneumonia. 3. Mild coronary artery calcifications are noted suggesting coronary artery disease. 4. Stable 16 mm left periaortic lymph node is noted which most likely is reactive in etiology. 5. Stable small fat containing right inguinal and periumbilical hernias. Aortic Atherosclerosis (ICD10-I70.0). Electronically Signed   By: Marijo Conception M.D.   On: 11/01/2019 08:59     Scheduled Meds: . amLODipine  10 mg Oral Daily  . aspirin EC  81 mg Oral Daily  . doxycycline  100 mg Oral Q12H  . feeding supplement (ENSURE ENLIVE)  237 mL Oral BID BM  . heparin  5,000 Units Subcutaneous Q8H  . hydrALAZINE  25 mg Oral Q8H  . insulin aspart  0-9 Units Subcutaneous TID WC  . meclizine  25 mg Oral TID  . metoprolol succinate  25 mg Oral Daily  . multivitamin with minerals  1 tablet Oral Daily  . sodium chloride flush  3 mL Intravenous Q12H  . traZODone  50 mg Oral Once   Continuous Infusions: . cefTRIAXone (ROCEPHIN)  IV Stopped (11/01/19 1617)     LOS: 5 days    Time spent: [place], MD [facility] To contact the attending provider between 7A-7P or the covering provider during after hours 7P-7A, please log into the web site [email or web address] and access using universal [place] password for that web site. If you do not have the password, please call the hospital operator.  11/02/2019, 10:40 AM

## 2019-11-02 NOTE — Progress Notes (Signed)
SATURATION QUALIFICATIONS: (This note is used to comply with regulatory documentation for home oxygen)  Patient Saturations on Room Air at Rest = 87%  Patient Saturations on Room Air while Ambulating = 82-84%  Patient Saturations on 1.5 Liters of oxygen while Ambulating = 92%  Please briefly explain why patient needs home oxygen:

## 2019-11-02 NOTE — Progress Notes (Signed)
[facility] Progress Note  Subjective: 700 cc UOP, wt's stable 202 lbs  Vitals:   11/02/19 0531 11/02/19 1012 11/02/19 1015 11/02/19 1044  BP: (!) 155/78 (!) 163/80    Pulse: 75 76    Resp: 18 17    Temp: 99 F (37.2 C)     TempSrc: Oral     SpO2: 92% (!) 89% 93%   Weight:    92 kg  Height:        Exam: GEN NAD, sitting in chair HEENT EOMI PERRL off O2 NECK + JVD PULM improved R> L rales CV RRR no m/r/g ABD soft, nontedner, distended +ascites EXT no LE edema NEURO AAO x 3 nonfocal SKIN warm and dry, no rashes   TTE 6/26 with no WMA, EF 55-60%, moderate LVH, and > 50% IVC respiratory variability c/w 3 mm Hg pressure  CXR 6.25 - FINDINGS: Mild to moderate severity infiltrate is seen within the right lung base. There is a small right pleural effusion... There is mild to moderate severity enlargement of the cardiac silhouette   Renal US 6/27 - 9.9/ 11.5 cm kidneys, no hydro, bilat [name]   6/27 UA - negative, 100 prot   Creat 2.3 in April 2021 at [facility]   6/29 CT abd/ chest/ pelvis - IMPRESSION: 1. Small bilateral pleural effusions are noted, right greater than left. 2. Large right lower lobe airspace opacity is noted concerning for pneumonia. Multiple ground-glass opacities are noted in the left upper and lower lobes concerning for multifocal pneumonia. 3. Mild coronary artery calcifications are noted suggesting coronary artery disease. 4. Stable 16 mm left periaortic lymph node is noted which most likely is reactive in etiology. 5. Stable small fat containing right inguinal and periumbilical hernias.  Assessment/ Plan: 1. AoCKD 3b - b/l creat 2.2- 2.4, eGFR 30- 41m/ min.  Creat here 2.7- 2.9.  Got IV lasix for suspected vol overload / SOB and SOB improved, but with rising creatinine and echo results, the Lasix was then held.  Creat stable but exam/ repeat CXR / wt's suggest vol excess > therefore will resume lasix at '40mg'$  IV bid, get vol down. Usual wt is 195lbs.    2. Acute resp failure/ RLL infiltrate - per CT pt has bilat LL PNA, is getting IV abx and LG temp/ [facility]  3. HTN - getting home meds x 2 , holding acei, cont Rx 4. DM 2 - per primary 5. Vertigo - defer to primary     RKelly Splinter6/30/2021, 12:58 PM   Recent Labs  Lab 11/01/19 0519 11/02/19 0538  K 5.0 5.2*  BUN 38* 39*  CREATININE 2.89* 2.98*  CALCIUM 7.0* 6.9*  HGB 9.3* 9.6*   Inpatient medications: . amLODipine  10 mg Oral Daily  . aspirin EC  81 mg Oral Daily  . calcium carbonate  1 tablet Oral QHS  . doxycycline  100 mg Oral Q12H  . feeding supplement (ENSURE ENLIVE)  237 mL Oral BID BM  . furosemide  40 mg Intravenous Q12H  . heparin  5,000 Units Subcutaneous Q8H  . hydrALAZINE  25 mg Oral Q8H  . insulin aspart  0-9 Units Subcutaneous TID WC  . meclizine  25 mg Oral TID  . metoprolol succinate  25 mg Oral Daily  . multivitamin with minerals  1 tablet Oral Daily  . sodium chloride flush  3 mL Intravenous Q12H  . traZODone  50 mg Oral Once   . cefTRIAXone (ROCEPHIN)  IV Stopped (11/01/19 1617)   acetaminophen **OR** acetaminophen, HYDROcodone-acetaminophen, ipratropium-albuterol, ondansetron **OR** ondansetron (ZOFRAN) IV, phenol, senna-docusate

## 2019-11-02 NOTE — Plan of Care (Signed)

## 2019-11-02 NOTE — Plan of Care (Signed)
  Problem: Education: ?Goal: Knowledge of General Education information will improve ?Description: Including pain rating scale, medication(s)/side effects and non-pharmacologic comfort measures ?Outcome: Progressing ?  ?Problem: Clinical Measurements: ?Goal: Ability to maintain clinical measurements within normal limits will improve ?Outcome: Progressing ?Goal: Diagnostic test results will improve ?Outcome: Progressing ?Goal: Respiratory complications will improve ?Outcome: Progressing ?  ?Problem: Activity: ?Goal: Risk for activity intolerance will decrease ?Outcome: Progressing ?  ?Problem: Nutrition: ?Goal: Adequate nutrition will be maintained ?Outcome: Progressing ?  ?Problem: Coping: ?Goal: Level of anxiety will decrease ?Outcome: Progressing ?  ?Problem: Pain Managment: ?Goal: General experience of comfort will improve ?Outcome: Progressing ?  ?

## 2019-11-03 LAB — RENAL FUNCTION PANEL
Albumin: 3 g/dL — ABNORMAL LOW (ref 3.5–5.0)
Anion gap: 9 (ref 5–15)
BUN: 41 mg/dL — ABNORMAL HIGH (ref 8–23)
CO2: 25 mmol/L (ref 22–32)
Calcium: 7.1 mg/dL — ABNORMAL LOW (ref 8.9–10.3)
Chloride: 106 mmol/L (ref 98–111)
Creatinine, Ser: 2.83 mg/dL — ABNORMAL HIGH (ref 0.61–1.24)
GFR calc Af Amer: 25 mL/min — ABNORMAL LOW (ref >60)
GFR calc non Af Amer: 22 mL/min — ABNORMAL LOW (ref >60)
Glucose, Bld: 138 mg/dL — ABNORMAL HIGH (ref 70–99)
Phosphorus: 5.9 mg/dL — ABNORMAL HIGH (ref 2.5–4.6)
Potassium: 4.8 mmol/L (ref 3.5–5.1)
Sodium: 140 mmol/L (ref 135–145)

## 2019-11-03 LAB — CBC WITH DIFFERENTIAL/PLATELET
Abs Immature Granulocytes: 0.12 10*3/uL — ABNORMAL HIGH (ref 0.00–0.07)
Basophils Absolute: 0.1 10*3/uL (ref 0.0–0.1)
Basophils Relative: 1 %
Eosinophils Absolute: 0.4 10*3/uL (ref 0.0–0.5)
Eosinophils Relative: 5 %
HCT: 31.7 % — ABNORMAL LOW (ref 39.0–52.0)
Hemoglobin: 9.8 g/dL — ABNORMAL LOW (ref 13.0–17.0)
Immature Granulocytes: 2 %
Lymphocytes Relative: 24 %
Lymphs Abs: 1.9 10*3/uL (ref 0.7–4.0)
MCH: 26.9 pg (ref 26.0–34.0)
MCHC: 30.9 g/dL (ref 30.0–36.0)
MCV: 87.1 fL (ref 80.0–100.0)
Monocytes Absolute: 0.7 10*3/uL (ref 0.1–1.0)
Monocytes Relative: 9 %
Neutro Abs: 4.8 10*3/uL (ref 1.7–7.7)
Neutrophils Relative %: 59 %
Platelets: 244 10*3/uL (ref 150–400)
RBC: 3.64 MIL/uL — ABNORMAL LOW (ref 4.22–5.81)
RDW: 14.5 % (ref 11.5–15.5)
WBC: 8 10*3/uL (ref 4.0–10.5)
nRBC: 0.4 % — ABNORMAL HIGH (ref 0.0–0.2)

## 2019-11-03 LAB — GLUCOSE, CAPILLARY
Glucose-Capillary: 112 mg/dL — ABNORMAL HIGH (ref 70–99)
Glucose-Capillary: 147 mg/dL — ABNORMAL HIGH (ref 70–99)
Glucose-Capillary: 137 mg/dL — ABNORMAL HIGH (ref 70–99)
Glucose-Capillary: 145 mg/dL — ABNORMAL HIGH (ref 70–99)

## 2019-11-03 MED ORDER — RENA-VITE PO TABS
1.0000 | ORAL_TABLET | Freq: Every day | ORAL | Status: DC
  Administered 2019-11-03 – 2019-11-05 (×3): 1 via ORAL
  Filled 2019-11-03 (×3): qty 1

## 2019-11-03 MED ORDER — HYDRALAZINE HCL 50 MG PO TABS
50.0000 mg | ORAL_TABLET | Freq: Three times a day (TID) | ORAL | Status: DC
  Administered 2019-11-03 – 2019-11-06 (×9): 50 mg via ORAL
  Filled 2019-11-03 (×9): qty 1

## 2019-11-03 MED ORDER — PRO-STAT SUGAR FREE PO LIQD
30.0000 mL | Freq: Three times a day (TID) | ORAL | Status: DC
  Administered 2019-11-03 – 2019-11-05 (×5): 30 mL via ORAL
  Filled 2019-11-03 (×7): qty 30

## 2019-11-03 MED ORDER — CEFDINIR 300 MG PO CAPS
300.0000 mg | ORAL_CAPSULE | Freq: Two times a day (BID) | ORAL | Status: DC
  Administered 2019-11-03 – 2019-11-06 (×6): 300 mg via ORAL
  Filled 2019-11-03 (×6): qty 1

## 2019-11-03 NOTE — Discharge Instructions (Signed)
Potassium Content of Foods You can eat healthy foods with potassium every day. Your body needs this mineral to work properly. It helps your nerves to function and muscles to contract and keeps your heartbeat regular. High levels of potassium in the blood can cause irregular heartbeats and can be life threatening. A low blood level can cause severe muscle weakness and cramps.  Aim for no more than 2000 mg of potassium/day.   Tips Check portion sizes closely.  For example, a serving of dried raisins is  cup and a serving of fresh grapes is  cup. The Nutrition Facts label presents the amount of potassium per serving. Products labeled low salt or low sodium may have more potassium since potassium is used as a salt substitute. Check the ingredient list for sources of potassium: Potassium chloride, potassium sorbate, tetrapotassium phosphate, dipotassium phosphate.  Potassium in Foods A low-potassium food can become a high potassium food if you eat a large amount. A high-potassium food can be considered a low potassium food if you only eat a small amount of it.   Lower Potassium Choices Less than 200 milligrams per serving  Higher Potassium Choices 200 milligrams or more per serving  Serving size Serving size of food is  cup, 1 small fruit, or 1 cup leafy green unless otherwise noted.  Potassium levels may change if a food is fresh, cooked, or canned  Vegetables low in potassium: Asparagus Beans (green, wax, yellow) Bean sprouts Broccoli Cabbage (red or green) Carrots  Cauliflower Celery Corn Cucumbers Eggplant Greens (kale, mustard greens, endive, watercress) Peppers (green, red, orange) Kale Lettuce (all types) Leeks  Mushrooms Onions Peas (green, sugar snap, snow peas) Radishes (5) Summer squash Turnip  Vegetables high in potassium: Artichokes (1) Avocado Bamboo shoots (raw) Beets (canned, fresh) Brussels sprouts (fresh, frozen) Chard (cooked) Chinese  cabbage/bok choy (cooked) Corn (1 ear) Kohlrabi Potatoes (white, sweet, yams) Parsnips Pumpkin Rutabaga Spinach (cooked, canned, frozen) Squash (winter, acorn, butternut, rhubarb) Tomatoes (raw, boiled, canned, purees, sauces) Vegetable or tomato juice    Fruits low in potassium: Apple, applesauce Apricot (1) Berries: blackberries blueberries cranberries, raspberries, strawberries Cherries Clementine (1), mandarin orange, tangerine Dried apples, blueberries, cherries, cranberries ( cup) Fruit cup: any fruit, fruit cocktail Grapes Grapefruit () Lemon and limes Plum (1) Pear Watermelon (1 cup) Fruit juice: apple, cranberry, grape, pineapple Nectars: apricot, mango, papaya, peach, pear  Fruits high in potassium: Bananas Dried fruit (such as apricots, dates, figs, prunes, raisins, currants)  cup Kiwi (1) Melon: Cantaloupe, honeydew Nectarine (1 medium) Orange (fresh, 1 medium) Papaya (1/2 medium) Peach Plantain Pomegranate Fruit juice: pomegranate, prune, orange, carrot    Grains low in potassium: Cereal, whole grain and plain (1cup)  Oatmeal (1 cup)  Bread, whole grain, raisin, white (1 slice) Pasta (whole grain and white) Rice (white or brown)   Grains high in potassium: Bran cereals Granola   Protein foods low in potassium: Peanut butter (1 tablespoon)  Hummus ( cup)    Tofu (firm, soft, silken) Vegetarian patty (2 ounces, size of  palm of hand)  Protein foods high in potassium: Legumes/Pulses: Black, kidney, pinto or white beans, black-eyed peas, split peas, lentils, chickpeas, garbanzos Beef, fish, and poultry (3 ounces, size of palm of hand) Nuts ( cup) Soy: soy nuts , tofu (extra firm or lite)   Dairy and Alternatives low in potassium: Rice, almond, or oat dairy alternates Cream cheese (1 tablespoon) Natural cheese (blue, brie, cheddar, Swiss) (1 ounce) Sour cream (1 tablespoon)   Dairy  and Alternatives high in potassium: Milk (8  ounces)  Yogurt (fruit flavors, 8 ounces) Soy milk Cottage cheese (1 cup) Coconut milk   Beverages low in potassium: Lemonade (8 ounces) Tea (8 ounces, fresh brewed)    Beverages high in potassium: Coconut water Low-sodium broths and soups (1 cup,  can,1 packet, bouillon cube) Bottled/instant tea       Phosphorus Content of Foods Phosphorus is an important mineral that your body uses for energy and overall health. What you eat and drink can affect the amount of phosphorus in your body. The key to selecting foods with phosphorus is having the right balance for your needs.  Natural and Added Phosphorus Natural Phosphorus: Phosphorus occurs naturally in meats, dairy, grains, and vegetables. Your body absorbs about half of this natural phosphorus from foods and drinks.  Added Phosphorus: Phosphorus is also added to many foods and drinks as a preservative. Your body absorbs nearly all of the added phosphorus from foods and drinks.  How Much Phosphorus is in Food and Drinks? Nutrition Facts labels dont typically include phosphorus amounts and they dont identify whether the phosphorus in the product is natural or added.  Read the ingredients list to check if a product label displays phos in the ingredients. This abbreviation will indicate for sure that phosphorus has been added.  Ingredients with phosphorus that are most commonly added to food include disodium phosphate, sodium hexametaphosphate, phosphoric acid, calcium phosphate, and dipotassium phosphate.  Foods and drinks with the highest added phosphorus are usually processed foods, packaged foods, and fast foods.  Phosphorus in Foods    Grains and baked goods lower in phosphorus: Fresh breads, buns, dinner rolls, bagels English muffins, or pitas without phos ingredients Plain cereals such as oatmeal, corn flakes, rice crispies Reduced-salt crackers, rice cakes, pretzels, popcorn, or tortilla chips without phos  ingredients   Grains and baked goods higher in phosphorus: Processed breads and cereals with phosphorus additives on food label Biscuits, brownies, cakes, muffins, pancakes, pastries, or waffles that are ready-to-eat or made from a dry mix with phos on the label Refrigerated or frozen dough for biscuits, cookies, pastries, or sweet rolls with phos ingredients   Protein foods lower in phosphorus: All-natural chicken, [place], fish or seafood Lean and fresh beef, lamb, pork, veal, or wild game (3 ounces is size of palm of hand) Whole eggs or egg whites (1 egg is 1 ounce) Tofu, beans, lentils, hummus (1/4-1/3 cup) Unsalted nuts (1/4 cup) or nut butters (1 tablespoon)  Protein foods higher in phosphorus: Processed meats like bacon, ham, hot dogs, chicken nuggets or strips, bologna, salami, or sausage Breaded or fried meats, chicken, fish or seafood Organ meats such as kidney or liver   Dairy and Dairy Alternatives lower in phosphorus: Unfortified dairy alternates such as almond or rice beverages Milk or soy beverage (1/2 cup) Cottage cheese with no phos ingredients (1/2 cup) Yogurt (6 ounces) all natural, unsweetened, or plain preferred Natural cheese such as brie, feta, Swiss, cheddar, or mozzarella.  Only have a small amount (1 ounce - size of your thumb or 2 dice) Regular or low-fat cream cheese, [place], or sour cream (1 tbsp) Sherbet, sorbet, fruit ice, or popsicles (1/2 cup)   Dairy and Dairy Alternatives higher in phosphorus: Non-dairy creamers and some half and half creamers with phos  Enriched dairy alternates such as almond, oat or rice beverages Processed cheese, such as American Processed cheese spreads and dips Fat free cream cheese or sour cream Ice cream, pudding  or frozen yogurt Milk-based or cheese-based soups or sauces   Vegetables lower in phosphorus: Fresh, frozen, or canned without added phos ingredients   Vegetables higher in  phosphorus: Vegetables with sauces added Frozen or packaged potatoes or vegetables with phos ingredients   Fruits lower in phosphorus: Fresh, frozen or canned without added phos ingredients   Fruits higher in phosphorus:  Fresh, frozen or canned with added phos ingredients   Beverages lower in phosphorus: Water Fresh brewed coffee or tea Fresh lemonade or pure fruit juice (1/2 cup) Beverages without phos ingredients   Beverages higher in phosphorus: Beverage or powdered mix with phos ingredients: most colas, energy and sports drinks, canned or bottled coffees and teas, flavored waters and drink mixes. Beers and wines   Other (fast, convenience or restaurant foods) lower in phosphorus: Hamburgers, fish filet (no cheese), plain chicken wings Grilled, roasted, broiled, baked fish, chicken, [place], fish or seafood. Plain prepared eggs (no cheese, ham, bacon, sausage), [place] toast, English muffin, bagel, hot cereal, toast Pizza without meat or extra cheese Tacos, burritos, enchiladas fajitas with limited toppings. Choose white rice, lettuce, sauted onions, bell peppers on the side Tuna or egg salad sandwich (no cheese) Sides: salad without cheese, coleslaw, apple slices, applesauce, grapes, or carrots Meals with no phos ingredients and have less than 600 milligrams of sodium per serving   Other (fast, convenience or restaurant foods) higher in phosphorus: Battered or fried fish or chicken including nuggets, sandwiches, strips or wings Pizza with meat and extra cheese Tacos, burritos, enchiladas with meat and toppings such as cheese and beans Hot dogs and sausages Any sandwich made with ham, processed deli meats, American cheese, or bacon [place] fries or other fried potatoes or battered vegetables, biscuits, or macaroni and cheese Meals or soups with phos ingredients and more than 600 millligrams of sodium per serving

## 2019-11-03 NOTE — Plan of Care (Signed)
  Problem: Education: Goal: Knowledge of General Education information will improve Description Including pain rating scale, medication(s)/side effects and non-pharmacologic comfort measures Outcome: Progressing   

## 2019-11-03 NOTE — Progress Notes (Signed)
Occupational Therapy Treatment Patient Details Name: Melvin Mcclure MRN: [id number] DOB: 1950-08-03 Today's Date: 11/03/2019    History of present illness 68 y.o. male with medical history significant for diastolic HF, hypertension, diabetes mellitus type 2, CKD stage IV, vertigo, presents to the ED complaining of shortness of breath, PND, dizziness and generalized weakness for the past few days.   OT comments  Treatment focused on improving activity tolerance in order to improve independence with ADLs. Patient progressing nicely but limited by need for [place] and mild complaints of dizziness - with patient requesting assistance for lower body dressing. Patient performed ambulation in room, marching in place, ambulating to bathroom and ADLs while therapist monitored o2 sats on RA and with [place]. O2 sats dropping between 82-85% on RA and maintaining in low 90s on 2L . Educated patient on use of  with ambulation in room and toileting as well as use of incentive spirometry and maintaining activity to improve cardiopulmonary endurance. Patient reports frustration with vertigo and treatment. Therapist recommended vestibular certified therapist at discharge. Cont POC.   Follow Up Recommendations  No OT follow up    Equipment Recommendations  None recommended by OT    Recommendations for Other Services      Precautions / Restrictions Precautions Precautions: Fall Precaution Comments: vertigo, monitor o2 Restrictions Weight Bearing Restrictions: No       Mobility Bed Mobility Overal bed mobility: Modified Independent             General bed mobility comments: o2 sat dropped to 82% with transfer to side of bed on RA.  Transfers       Sit to Stand: Modified independent (Device/Increase time)         General transfer comment: modified independent for ambulation in room needing increased time. Patient ambulated in room x 3 laps to work on improving activity tolerance with o2. Patient  marched in place for 30 seconds. o2 sat WFL when o2 donned. Limited by mild complaints of vertigo with turns in room.    Balance                                           ADL either performed or assessed with clinical judgement   ADL       Grooming: Modified independent Grooming Details (indicate cue type and reason): Patient modified independent to perform grooming standing at sink in bathroom. Patient ambulated to bathroom without o2 and reports doing so all night. Patient's o2 monitored and dropping into the mid 80s on RA. Patient educated on use of oxygen and provided with extension tubing. Patient reports not feeling short of breath.         Upper Body Dressing : Modified independent Upper Body Dressing Details (indicate cue type and reason): patient independent to donn shirt. Lower Body Dressing: Maximal assistance;Set up Lower Body Dressing Details (indicate cue type and reason): Patient had spouse assist him with donning socks and shoes due to complaints of mild dizziness with activity. Toilet Transfer: Modified Programmer, applications Details (indicate cue type and reason): Patient modified independent, needing increased time, to perform toileting. Toileting- Clothing Manipulation and Hygiene: Modified independent;Sit to/from stand Toileting - Clothing Manipulation Details (indicate cue type and reason): increased time. educated patient on the use of oxygen when going to the bathroom due to dropping o2 sats.  Vision       Perception     Praxis      Cognition Arousal/Alertness: Awake/alert Behavior During Therapy: WFL for tasks assessed/performed Overall Cognitive Status: Within Functional Limits for tasks assessed                                          Exercises Other Exercises Other Exercises: Education and encouragement to use Incetive Spirometry.   Shoulder Instructions       General Comments       Pertinent Vitals/ Pain       Pain Assessment: No/denies pain  Home Living                                          Prior Functioning/Environment              Frequency  Min 2X/week        Progress Toward Goals  OT Goals(current goals can now be found in the care plan section)  Progress towards OT goals: Progressing toward goals     Plan Discharge plan remains appropriate    Co-evaluation                 AM-PAC OT "6 Clicks" Daily Activity     Outcome Measure   Help from another person eating meals?: None Help from another person taking care of personal grooming?: None Help from another person toileting, which includes using toliet, bedpan, or urinal?: None Help from another person bathing (including washing, rinsing, drying)?: None Help from another person to put on and taking off regular upper body clothing?: None Help from another person to put on and taking off regular lower body clothing?: None 6 Click Score: 24    End of Session    OT Visit Diagnosis: Unsteadiness on feet (R26.81);Muscle weakness (generalized) (M62.81)   Activity Tolerance Patient tolerated treatment well   Patient Left with call bell/phone within reach;with family/visitor present   Nurse Communication Mobility status (o2 extension applied)        Time: 6256-3893 OT Time Calculation (min): 47 min  Charges: OT General Charges $OT Visit: 1 Visit OT Treatments $Self Care/Home Management : 23-37 mins $Therapeutic Activity: 8-22 mins  Sebastien Jackson, OTR/L [place]  Office [phone] Pager: [place] 11/03/2019, 11:09 AM

## 2019-11-03 NOTE — Progress Notes (Signed)
PROGRESS NOTE    Melvin Mcclure  ZOX:[id number] DOB: Jul 23, 1950 DOA: 10/28/2019 PCP: Nolene Ebbs, MD  Brief Narrative:  66 AAM  DM TY 2 on insulin since 2004 with neuropathy Stage III-IV CKD?  Cardiorenal syndrome followed by Dr. Harrie Jeans of nephrology-previously on home torsemide Diastolic heart failure severe LVH diagnosed 05/2018 Vertigo Admitted 10/28/2019 progressive DOE, S OB, wheeze with low-grade fever Found to be hypoxic 90s requiring 3 L of oxygen EKG showed no changes chest x-ray showed right lobe infiltrate and started on azithromycin ceftriaxone  Assessment & Plan:   Principal Problem:   CAP (community acquired pneumonia) Active Problems:   Type 2 diabetes mellitus with stage 3 chronic kidney disease ([place])   CHF (congestive heart failure) ([place])   1. Multifactorial dyspnea- a. Lasix resumed by nephrology with good effect b. Dosing as per nephrology i. For pneumonia-complete antibiotics 7 days and then taper to orals on 7/2 ii. -1.1 L weight down overnight c. Combination of pneumonia  i. Pneumonia community-acquired 1. See above discussion 2. Pulmonary hygiene toileting and incentive spirometer every hourly have explained this clearly to patient 2. Diabetes mellitus type 2 a. Blood sugars 1 12-1 47 b. sliding-scale coverage continue sliding scale coverage c.  (home meds include 5 units of Lantus and sliding scale coverage at this time) d. Continue gabapentin on discharge at low-dose 3. CKD 3 prior cardiorenal syndrome 4. Hyperkalemia mild a. Hyperkalemia resolved b. Lisinopril 10 been stopped continuing amlodipine 10 c. Phosphorus 5.9 recheck a.m. 5. HTN a. Moderate control at this time on amlodipine as above, metoprolol 25 succinate--lower dose hydralazine 50 3 times daily as opposed to home dose 100 b. ACE has been held because of renal insufficiency 6. Vertigo 7. Not BPPV per PT-? DDX MEnier's a. Could trial Betahistine?  Will need outpatient follow-up  with ENT b. Already on diuretics   DVT prophylaxis:  Code Status: Full Family Communication: Discussed with wife at bedside  disposition:   Status is: Inpatient  Remains inpatient appropriate because:Ongoing active pain requiring inpatient pain management and IV treatments appropriate due to intensity of illness or inability to take PO   Dispo:  Patient From: Home  Planned Disposition: Home  Expected discharge date I expect he will be here through Saturday, 11/05/2019  Medically stable for discharge: No        Consultants:   Nephrology  Procedures: Multiple CTs  Antimicrobials: Ceftriaxone and doxycycline as above   Subjective:  Main complaint when I saw him this morning was that he was still vertiginous When he lays on his right side he experiences dizziness I spoke with the PT who evaluated him on Saturday and it appears she does not think he has BPPV Otherwise he has been putting out good urine and feels like he is breathing a bit better I revisited in the afternoon and he had company and looks much better than he did this morning-I suspect that his lack of sleep is contributing to his slow progress He has no chest pain cough fever chills or sputum today  Objective: Vitals:   11/03/19 0500 11/03/19 0925 11/03/19 1007 11/03/19 1249  BP:   (!) 166/86 134/69  Pulse:   72 69  Resp:   16 19  Temp:   97.9 F (36.6 C) 98.2 F (36.8 C)  TempSrc:   Oral Oral  SpO2:  (!) 82% 98% 96%  Weight: 91.5 kg     Height:        Intake/Output Summary (Last  24 hours) at 11/03/2019 1329 Last data filed at 11/03/2019 0910 Gross per 24 hour  Intake 460 ml  Output 1150 ml  Net -690 ml   Filed Weights   11/02/19 0500 11/02/19 1044 11/03/19 0500  Weight: 96.6 kg 92 kg 91.5 kg    Examination:  Coherent alert oriented no distress Using minimal oxygen Chest seems to have bilateral rales no wheeze no crackles Abdomen is obese slightly distended I cannot appreciate any JVD or  lower extremity swelling He appears a little bit tired Neurologically intact moving all 4 limbs   Data Reviewed: I have personally reviewed following labs and imaging studies Potassium 4.8 BUN/creatinine 39/2.9-->41/2.8 Hemoglobin 9.8 platelet 244  Radiology Studies: No results found.   Scheduled Meds: . amLODipine  10 mg Oral Daily  . aspirin EC  81 mg Oral Daily  . calcium carbonate  1 tablet Oral QHS  . doxycycline  100 mg Oral Q12H  . feeding supplement (ENSURE ENLIVE)  237 mL Oral BID BM  . furosemide  40 mg Intravenous Q12H  . heparin  5,000 Units Subcutaneous Q8H  . hydrALAZINE  50 mg Oral Q8H  . insulin aspart  0-9 Units Subcutaneous TID WC  . meclizine  25 mg Oral TID  . metoprolol succinate  25 mg Oral Daily  . multivitamin with minerals  1 tablet Oral Daily  . sodium chloride flush  3 mL Intravenous Q12H  . traZODone  50 mg Oral Once   Continuous Infusions: . cefTRIAXone (ROCEPHIN)  IV 1 g (11/02/19 1625)     LOS: 6 days    Time spent: 40  Nita Sells, MD [facility] To contact the attending provider between 7A-7P or the covering provider during after hours 7P-7A, please log into the web site [email or web address] and access using universal [place] password for that web site. If you do not have the password, please call the hospital operator.  11/03/2019, 1:29 PM

## 2019-11-03 NOTE — Progress Notes (Signed)
Melvin Mcclure Progress Note  Subjective: no UOP recorded yest, but pt states he voided "a lot" and today has 1150 UOP already recorded.  Feeling a bit better.   Vitals:   11/02/19 2054 11/03/19 0452 11/03/19 0500 11/03/19 1007  BP: (!) 153/83 (!) 158/79  (!) 166/86  Pulse: 76 76  72  Resp: '18 17  16  '$ Temp: 98.5 F (36.9 C) 98.2 F (36.8 C)  97.9 F (36.6 C)  TempSrc:    Oral  SpO2: 94% 91%  98%  Weight:   91.5 kg   Height:        Exam: GEN NAD, sitting in chair HEENT EOMI PERRL off O2 NECK + JVD PULM mild R> L rales CV RRR no m/r/g ABD soft, nontedner, +distended +ascites EXT no LE edema NEURO AAO x 3 nonfocal SKIN warm and dry, no rashes   TTE 6/26 with no WMA, EF 55-60%, moderate LVH, and > 50% IVC respiratory variability c/w 3 mm Hg pressure  CXR 6.25 - FINDINGS: Mild to moderate severity infiltrate is seen within the right lung base. There is a small right pleural effusion... There is mild to moderate severity enlargement of the cardiac silhouette   Renal US 6/27 - 9.9/ 11.5 cm kidneys, no hydro, bilat Melvin Mcclure   6/27 UA - negative, 100 prot   Creat 2.3 in April 2021 at [facility]   6/29 CT abd/ chest/ pelvis - IMPRESSION: 1. Small bilateral pleural effusions are noted, right greater than left. 2. Large right lower lobe airspace opacity is noted concerning for pneumonia. Multiple ground-glass opacities are noted in the left upper and lower lobes concerning for multifocal pneumonia. 3. Mild coronary artery calcifications are noted suggesting coronary artery disease. 4. Stable 16 mm left periaortic lymph node is noted which most likely is reactive in etiology. 5. Stable small fat containing right inguinal and periumbilical hernias.  Assessment/ Plan: 1. AoCKD 3b - b/l creat 2.2- 2.4, eGFR 30- 28m/ min.  Creat here 2.7- 2.9.  Got IV lasix for suspected vol overload / SOB and SOB improved, but with rising creatinine and echo results, the Lasix was then held. Pt's SpO2  worsenened along w/ abd swelling which is typical for him when fluid overloaded, also repeat CXR looked wet so we resumed IV lasix yest w/ good results so far. Cont IV lasix 40 bid, will need another 1-2 days diurese I suspect.  2. Acute resp failure/ RLL infiltrate - per CT pt has bilat LL PNA, is getting IV abx and LG temp/ ^WBC improving.  3. HTN - getting home norvasc/ metoprolol and lower dose hydralazine, holding acei. BP's up, will ^hydralazine to 50 tid (100 tid at home).  4. DM 2 - per primary 5. Vertigo - defer to primary     Melvin Mcclure 11/03/2019, 10:15 AM   Recent Labs  Lab 11/02/19 0538 11/03/19 0531  K 5.2* 4.8  BUN 39* 41*  CREATININE 2.98* 2.83*  CALCIUM 6.9* 7.1*  PHOS  --  5.9*  HGB 9.6* 9.8*   Inpatient medications: . amLODipine  10 mg Oral Daily  . aspirin EC  81 mg Oral Daily  . calcium carbonate  1 tablet Oral QHS  . doxycycline  100 mg Oral Q12H  . feeding supplement (ENSURE ENLIVE)  237 mL Oral BID BM  . furosemide  40 mg Intravenous Q12H  . heparin  5,000 Units Subcutaneous Q8H  . hydrALAZINE  25 mg Oral Q8H  . insulin aspart  0-9  Units Subcutaneous TID WC  . meclizine  25 mg Oral TID  . metoprolol succinate  25 mg Oral Daily  . multivitamin with minerals  1 tablet Oral Daily  . sodium chloride flush  3 mL Intravenous Q12H  . traZODone  50 mg Oral Once   . cefTRIAXone (ROCEPHIN)  IV 1 g (11/02/19 1625)   acetaminophen **OR** acetaminophen, HYDROcodone-acetaminophen, ipratropium-albuterol, ondansetron **OR** ondansetron (ZOFRAN) IV, phenol, senna-docusate

## 2019-11-03 NOTE — Progress Notes (Signed)
Nutrition Follow-up  DOCUMENTATION CODES:   Not applicable  INTERVENTION:  - continue Ensure Enlive BID (no chocolate flavor d/t Phos content). - will order 30 ml prostat TID, each supplement provides 100 kcal and 15 grams protein (renal diet-friendly).  - will change from standard multivitamin with minerals to rena-vite.  - will place handouts in Discharge Instructions.  NUTRITION DIAGNOSIS:   Inadequate oral intake related to acute illness (CAP) as evidenced by meal completion < 50%. -ongoing, slightly improved  GOAL:   Patient will meet greater than or equal to 90% of their needs -unmet  MONITOR:   Labs, Supplement acceptance, PO intake, Weight trends  REASON FOR ASSESSMENT:   Consult Diet education  ASSESSMENT:   69 year old male admitted for CAP with past medical history of dCHF, HTN, DM2, CKD stage IV, vertigo presented with SOB, PND, dizziness, and generalized weakness over the past few days.  Patient's intakes over the past 5 days have been variable: 0-75%. He has been accepting Ensure 50-75% of the time offered. Will make adjustments to orders as outlined above.   Patient is currently on Renal/Carb Modified diet with 1.2L fluid restriction and is interested in diet education. RD is off site this afternoon so handouts will be placed in Discharge Instructions rather than being able to be given directly to patient.   Weight is slightly down from admission; lasix order in place.   Per notes: - PNA - type 2 DM - hx of stage 3 CKD--worsened and Nephrology is following    Labs reviewed; CBGs: 112 and 147 mg/dl, BUN: 41 mg/dl, creatinine: 2.83 mg/dl, Ca: 7.1 mg/dl, Phos: 5.9 mg/dl, GFR: 25 ml/min.  Medications reviewed; 40 mg IV lasix BID, sliding scale novolog, 1 tablet multivitamin with minerals/day.  Diet Order:   Diet Order            Diet renal/carb modified with fluid restriction Diet-HS Snack? Nothing; Fluid restriction: 1200 mL Fluid; Room service  appropriate? Yes; Fluid consistency: Thin  Diet effective now                 EDUCATION NEEDS:   Education needs have been addressed  Skin:  Skin Assessment: Reviewed RN Assessment  Last BM:  unknown  Height:   Ht Readings from Last 1 Encounters:  10/29/19 5\' 11"  (1.803 m)    Weight:   Wt Readings from Last 1 Encounters:  11/03/19 91.5 kg    Estimated Nutritional Needs:  Kcal:  2300-2500 Protein:  115-125 Fluid:  >/= 2.3 L/day     Jarome Matin, MS, RD, LDN, CNSC Inpatient Clinical Dietitian RD pager # available in [facility]  After hours/weekend pager # available in [facility]

## 2019-11-03 NOTE — Care Management Important Message (Signed)
Important Message  Patient Details IM Letter presented to the Patient Name: Melvin Mcclure MRN: [id number] Date of Birth: 1950/10/12   Medicare Important Message Given:  Yes     Kerin Salen 11/03/2019, 10:54 AM

## 2019-11-04 LAB — BASIC METABOLIC PANEL
Anion gap: 10 (ref 5–15)
BUN: 46 mg/dL — ABNORMAL HIGH (ref 8–23)
CO2: 26 mmol/L (ref 22–32)
Calcium: 7.6 mg/dL — ABNORMAL LOW (ref 8.9–10.3)
Chloride: 105 mmol/L (ref 98–111)
Creatinine, Ser: 2.83 mg/dL — ABNORMAL HIGH (ref 0.61–1.24)
GFR calc Af Amer: 25 mL/min — ABNORMAL LOW (ref >60)
GFR calc non Af Amer: 22 mL/min — ABNORMAL LOW (ref >60)
Glucose, Bld: 129 mg/dL — ABNORMAL HIGH (ref 70–99)
Potassium: 4.9 mmol/L (ref 3.5–5.1)
Sodium: 141 mmol/L (ref 135–145)

## 2019-11-04 LAB — CBC WITH DIFFERENTIAL/PLATELET
Abs Immature Granulocytes: 0.12 10*3/uL — ABNORMAL HIGH (ref 0.00–0.07)
Basophils Absolute: 0.1 10*3/uL (ref 0.0–0.1)
Basophils Relative: 1 %
Eosinophils Absolute: 0.5 10*3/uL (ref 0.0–0.5)
Eosinophils Relative: 5 %
HCT: 34.7 % — ABNORMAL LOW (ref 39.0–52.0)
Hemoglobin: 10.5 g/dL — ABNORMAL LOW (ref 13.0–17.0)
Immature Granulocytes: 1 %
Lymphocytes Relative: 29 %
Lymphs Abs: 2.6 10*3/uL (ref 0.7–4.0)
MCH: 26.6 pg (ref 26.0–34.0)
MCHC: 30.3 g/dL (ref 30.0–36.0)
MCV: 88.1 fL (ref 80.0–100.0)
Monocytes Absolute: 0.9 10*3/uL (ref 0.1–1.0)
Monocytes Relative: 10 %
Neutro Abs: 4.7 10*3/uL (ref 1.7–7.7)
Neutrophils Relative %: 54 %
Platelets: 293 10*3/uL (ref 150–400)
RBC: 3.94 MIL/uL — ABNORMAL LOW (ref 4.22–5.81)
RDW: 14.7 % (ref 11.5–15.5)
WBC: 8.8 10*3/uL (ref 4.0–10.5)
nRBC: 0.2 % (ref 0.0–0.2)

## 2019-11-04 LAB — GLUCOSE, CAPILLARY
Glucose-Capillary: 119 mg/dL — ABNORMAL HIGH (ref 70–99)
Glucose-Capillary: 124 mg/dL — ABNORMAL HIGH (ref 70–99)
Glucose-Capillary: 131 mg/dL — ABNORMAL HIGH (ref 70–99)
Glucose-Capillary: 53 mg/dL — ABNORMAL LOW (ref 70–99)
Glucose-Capillary: 84 mg/dL (ref 70–99)
Glucose-Capillary: 183 mg/dL — ABNORMAL HIGH (ref 70–99)

## 2019-11-04 MED ORDER — POLYETHYLENE GLYCOL 3350 17 G PO PACK
17.0000 g | PACK | Freq: Every day | ORAL | Status: DC
  Administered 2019-11-04 – 2019-11-05 (×2): 17 g via ORAL
  Filled 2019-11-04 (×2): qty 1

## 2019-11-04 MED ORDER — FUROSEMIDE 10 MG/ML IJ SOLN
80.0000 mg | Freq: Two times a day (BID) | INTRAMUSCULAR | Status: DC
  Administered 2019-11-04 – 2019-11-05 (×2): 80 mg via INTRAVENOUS
  Filled 2019-11-04 (×3): qty 8

## 2019-11-04 NOTE — Progress Notes (Addendum)
Hypoglycemic Event  CBG: 53  Treatment: 8 oz juice/soda  Symptoms: Sweaty and Shaky  Follow-up CBG: Time: 2008 CBG Result: 84  Possible Reasons for Event: Medication regimen: not used to taking insulin per wife  Comments/MD notified: Resolved with 8 oz orange juice    Nikola Blackston P Laquinda Moller

## 2019-11-04 NOTE — TOC Progression Note (Signed)
Transition of Care [facility]) - Progression Note    Patient Details  Name: Melvin Mcclure MRN: [id number] Date of Birth: 1950/08/20  [facility] CM/SW Contact  [name], LCSW Phone Number: 11/04/2019, 10:11 AM  Clinical Narrative:     TOC following. Pt status discussed with MD in Progression today. Per MD, he is hopeful pt may be able to dc home in the next 1-2 days. Plan remains for outpatient PT/OT at dc. Pt continuing to require 3L O2 in the hospital and TOC anticipating pt will need Home O2 at dc. Pt will need new qual sat study and O2 orders. Updated RN and MD. Thedore Mins from [place] already following for home O2.   Weekend TOC will follow and update [employer] of dc and O2 orders once entered.   Expected Discharge Plan: OP Rehab Barriers to Discharge: No Barriers Identified  Expected Discharge Plan and Services Expected Discharge Plan: OP Rehab   Discharge Planning Services: CM Consult Post Acute Care Choice: NA Living arrangements for the past 2 months: Single Family Home                                       Social Determinants of Health (SDOH) Interventions    Readmission Risk Interventions No flowsheet data found.

## 2019-11-04 NOTE — Progress Notes (Signed)
[facility] Progress Note  Subjective: UOP 2.2 L yest, no c/o's today, seen in room  Vitals:   11/04/19 0545 11/04/19 1000 11/04/19 1400 11/04/19 1422  BP: (!) 164/88 (!) 158/84 (!) 155/82 (!) 148/92  Pulse: 68 69  67  Resp: _0 Temp: 97.8 F (36.6 C) 98.5 F (36.9 C)  97.8 F (36.6 C)  TempSrc: Oral Oral  Oral  SpO2: 98% 98%  100%  Weight: 89.4 kg     Height:        Exam: GEN NAD, sitting in chair HEENT EOMI PERRL off O2 NECK + JVD PULM mild R> L rales CV RRR no m/r/g ABD soft, nontedner, +distended +ascites EXT no LE edema NEURO AAO x 3 nonfocal SKIN warm and dry, no rashes   TTE 6/26 with no WMA, EF 55-60%, moderate LVH, and > 50% IVC respiratory variability c/w 3 mm Hg pressure  CXR 6.25 - FINDINGS: Mild to moderate severity infiltrate is seen within the right lung base. There is a small right pleural effusion... There is mild to moderate severity enlargement of the cardiac silhouette   Renal US 6/27 - 9.9/ 11.5 cm kidneys, no hydro, bilat [name]   6/27 UA - negative, 100 prot   Creat 2.3 in April 2021 at [facility]   6/29 CT abd/ chest/ pelvis - IMPRESSION: 1. Small bilateral pleural effusions are noted, right greater than left. 2. Large right lower lobe airspace opacity is noted concerning for pneumonia. Multiple ground-glass opacities are noted in the left upper and lower lobes concerning for multifocal pneumonia. 3. Mild coronary artery calcifications are noted suggesting coronary artery disease. 4. Stable 16 mm left periaortic lymph node is noted which most likely is reactive in etiology. 5. Stable small fat containing right inguinal and periumbilical hernias.  Assessment/ Plan: 1. AoCKD 3b - b/l creat 2.2- 2.4, eGFR 30- 51m/ min.  Creat here 2.7- 2.9.  Got IV lasix for suspected vol overload / SOB and SOB improved, but with rising creatinine and echo results, the Lasix was then held. Pt's SpO2 worsenened along w/ abd swelling and CXR repeat looked wet  > resumed IV lasix and diuresing somewhat. Will ^ to 80 bid.  2. Acute resp failure/ RLL infiltrate - per CT pt has bilat LL PNA, is getting IV abx and LG temp/ ^WBC improving.  3. HTN - getting home norvasc/ metoprolol and lower dose hydralazine, holding acei. BP's up, will ^hydralazine to 50 tid (100 tid at home).  4. DM 2 - per primary 5. Vertigo - defer to primary     Rob Shaira Sova 11/04/2019, 2:29 PM   Recent Labs  Lab 11/03/19 0531 11/04/19 0450  K 4.8 4.9  BUN 41* 46*  CREATININE 2.83* 2.83*  CALCIUM 7.1* 7.6*  PHOS 5.9*  --   HGB 9.8* 10.5*   Inpatient medications: . amLODipine  10 mg Oral Daily  . aspirin EC  81 mg Oral Daily  . calcium carbonate  1 tablet Oral QHS  . cefdinir  300 mg Oral Q12H  . feeding supplement (ENSURE ENLIVE)  237 mL Oral BID BM  . feeding supplement (PRO-STAT SUGAR FREE 64)  30 mL Oral TID BM  . furosemide  40 mg Intravenous Q12H  . heparin  5,000 Units Subcutaneous Q8H  . hydrALAZINE  50 mg Oral Q8H  . insulin aspart  0-9 Units Subcutaneous TID WC  . meclizine  25 mg Oral TID  . metoprolol succinate  25 mg  Oral Daily  . multivitamin  1 tablet Oral QHS  . polyethylene glycol  17 g Oral Daily  . sodium chloride flush  3 mL Intravenous Q12H  . traZODone  50 mg Oral Once    acetaminophen **OR** acetaminophen, HYDROcodone-acetaminophen, ipratropium-albuterol, ondansetron **OR** ondansetron (ZOFRAN) IV, phenol, senna-docusate

## 2019-11-04 NOTE — Plan of Care (Signed)

## 2019-11-04 NOTE — Progress Notes (Addendum)
PROGRESS NOTE    Melvin Mcclure  KWI:[id number] DOB: 1951-01-07 DOA: 10/28/2019 PCP: Nolene Ebbs, MD  Brief Narrative:  21 AAM  DM TY 2 on insulin since 2004 with neuropathy Stage III-IV CKD?  Cardiorenal syndrome followed by Dr. Harrie Jeans of nephrology-previously on home torsemide Diastolic heart failure severe LVH diagnosed 05/2018 Vertigo Admitted 10/28/2019 progressive DOE, S OB, wheeze with low-grade fever Found to be hypoxic 90s requiring 3 L of oxygen EKG showed no changes chest x-ray showed right lobe infiltrate and started on azithromycin ceftriaxone  Assessment & Plan:   Principal Problem:   CAP (community acquired pneumonia) Active Problems:   Type 2 diabetes mellitus with stage 3 chronic kidney disease ([place])   CHF (congestive heart failure) ([place])   1. Multifactorial dyspnea-combination of acute diastolic heart failure and pneumonia a. Lasix resumed by nephrology 7/140 IV twice daily i. Weight is down to 89 kg from 93 on admission ii. -1.6 L so far b. Pneumonia i. For pneumonia-complete antibiotics 7 days and then taper to orals on 7/2 ii. We will repeat a chest x-ray two-view tomorrow morning 7/2 2. Diabetes mellitus type 2 a. Blood sugars ranging from 1 19-1 45 b. sliding-scale coverage continue c.  (home meds include 5 units of Lantus and sliding scale coverage at this time) d. Continue gabapentin on discharge at low-dose 3. CKD 3 prior cardiorenal syndrome 4. Hyperkalemia mild a. Hyperkalemia resolved b. Lisinopril 10 been stopped continuing amlodipine 10 c. Periodic phosphorus checks 5. HTN a. Moderate control at this time on amlodipine as above, metoprolol 25 succinate--lower dose hydralazine 50 3 times daily as opposed to home dose 100 b. If blood pressure still remains high in the next 24 hours may increase to hydralazine 100 c. ACE has been held because of renal insufficiency 6. Vertigo 7. Not BPPV per PT-? DDX MEnier's a. Could trial Betahistine?    b. Already on diuretics   DVT prophylaxis:  Code Status: Full  Family Communication: Wife not present at the bedside  disposition:   Status is: Inpatient  Remains inpatient appropriate because:Persistent severe electrolyte disturbances, Ongoing diagnostic testing needed not appropriate for outpatient work up and IV treatments appropriate due to intensity of illness or inability to take PO   Dispo:  Patient From: Home  Planned Disposition: Home  Expected discharge date I expect he will be here through Sunday 7/4  Medically stable for discharge: No   Consultants:   Nephrology  Procedures: Multiple CTs  Antimicrobials: Ceftriaxone and doxycycline as above Transition to [facility] 7/1  Subjective:  Did not sleep well overnight mild pains across chest wall with cough Did not sleep because of this Vertigo is improved compared to prior no fever no chills  Feels a little bit swollen but better than prior Otherwise he is breathing fairly better still on oxygen Tells me he is constipated and may need a laxative  Objective: Vitals:   11/03/19 1249 11/03/19 2118 11/04/19 0545 11/04/19 1000  BP: 134/69 (!) 173/86 (!) 164/88 (!) 158/84  Pulse: 69 73 68 69  Resp: 19 18 18 18   Temp: 98.2 F (36.8 C) 98.4 F (36.9 C) 97.8 F (36.6 C) 98.5 F (36.9 C)  TempSrc: Oral Oral Oral Oral  SpO2: 96% 94% 98% 98%  Weight:   89.4 kg   Height:        Intake/Output Summary (Last 24 hours) at 11/04/2019 1020 Last data filed at 11/04/2019 1001 Gross per 24 hour  Intake 720 ml  Output 1975  ml  Net -1255 ml   Filed Weights   11/02/19 1044 11/03/19 0500 11/04/19 0545  Weight: 92 kg 91.5 kg 89.4 kg    Examination:  Coherent looks somewhat sleepy in no distress Mild crackles bilateral and posteriorly Abdomen is softer than prior although still swollen ROM intact no lower extremity swelling Edema Neurologically intact moving all 4 limbs equally smile symmetric No rash cannot appreciate  JVD   Data Reviewed: I have personally reviewed following labs and imaging studies Potassium 4.9 BUN/creatinine stable in the 46/2.8 range Calcium 7.9 Hemoglobin 10.5 platelet 293 Radiology Studies: No results found.   Scheduled Meds: . amLODipine  10 mg Oral Daily  . aspirin EC  81 mg Oral Daily  . calcium carbonate  1 tablet Oral QHS  . cefdinir  300 mg Oral Q12H  . feeding supplement (ENSURE ENLIVE)  237 mL Oral BID BM  . feeding supplement (PRO-STAT SUGAR FREE 64)  30 mL Oral TID BM  . furosemide  40 mg Intravenous Q12H  . heparin  5,000 Units Subcutaneous Q8H  . hydrALAZINE  50 mg Oral Q8H  . insulin aspart  0-9 Units Subcutaneous TID WC  . meclizine  25 mg Oral TID  . metoprolol succinate  25 mg Oral Daily  . multivitamin  1 tablet Oral QHS  . sodium chloride flush  3 mL Intravenous Q12H  . traZODone  50 mg Oral Once   Continuous Infusions:    LOS: 7 days    Time spent: [place], MD [facility] To contact the attending provider between 7A-7P or the covering provider during after hours 7P-7A, please log into the web site [email or web address] and access using universal [place] password for that web site. If you do not have the password, please call the hospital operator.  11/04/2019, 10:20 AM

## 2019-11-05 ENCOUNTER — Inpatient Hospital Stay (HOSPITAL_COMMUNITY): Payer: Medicare Other

## 2019-11-05 LAB — CBC WITH DIFFERENTIAL/PLATELET
Abs Immature Granulocytes: 0.06 10*3/uL (ref 0.00–0.07)
Basophils Absolute: 0.1 10*3/uL (ref 0.0–0.1)
Basophils Relative: 1 %
Eosinophils Absolute: 0.4 10*3/uL (ref 0.0–0.5)
Eosinophils Relative: 4 %
HCT: 32.7 % — ABNORMAL LOW (ref 39.0–52.0)
Hemoglobin: 10.1 g/dL — ABNORMAL LOW (ref 13.0–17.0)
Immature Granulocytes: 1 %
Lymphocytes Relative: 23 %
Lymphs Abs: 1.9 10*3/uL (ref 0.7–4.0)
MCH: 27.2 pg (ref 26.0–34.0)
MCHC: 30.9 g/dL (ref 30.0–36.0)
MCV: 88.1 fL (ref 80.0–100.0)
Monocytes Absolute: 0.8 10*3/uL (ref 0.1–1.0)
Monocytes Relative: 10 %
Neutro Abs: 5.1 10*3/uL (ref 1.7–7.7)
Neutrophils Relative %: 61 %
Platelets: 267 10*3/uL (ref 150–400)
RBC: 3.71 MIL/uL — ABNORMAL LOW (ref 4.22–5.81)
RDW: 14.7 % (ref 11.5–15.5)
WBC: 8.3 10*3/uL (ref 4.0–10.5)
nRBC: 0 % (ref 0.0–0.2)

## 2019-11-05 LAB — BASIC METABOLIC PANEL
Anion gap: 11 (ref 5–15)
BUN: 49 mg/dL — ABNORMAL HIGH (ref 8–23)
CO2: 27 mmol/L (ref 22–32)
Calcium: 7.7 mg/dL — ABNORMAL LOW (ref 8.9–10.3)
Chloride: 102 mmol/L (ref 98–111)
Creatinine, Ser: 2.95 mg/dL — ABNORMAL HIGH (ref 0.61–1.24)
GFR calc Af Amer: 24 mL/min — ABNORMAL LOW (ref >60)
GFR calc non Af Amer: 21 mL/min — ABNORMAL LOW (ref >60)
Glucose, Bld: 195 mg/dL — ABNORMAL HIGH (ref 70–99)
Potassium: 5.2 mmol/L — ABNORMAL HIGH (ref 3.5–5.1)
Sodium: 140 mmol/L (ref 135–145)

## 2019-11-05 LAB — GLUCOSE, CAPILLARY
Glucose-Capillary: 106 mg/dL — ABNORMAL HIGH (ref 70–99)
Glucose-Capillary: 168 mg/dL — ABNORMAL HIGH (ref 70–99)
Glucose-Capillary: 136 mg/dL — ABNORMAL HIGH (ref 70–99)
Glucose-Capillary: 65 mg/dL — ABNORMAL LOW (ref 70–99)
Glucose-Capillary: 158 mg/dL — ABNORMAL HIGH (ref 70–99)
Glucose-Capillary: 216 mg/dL — ABNORMAL HIGH (ref 70–99)

## 2019-11-05 LAB — PHOSPHORUS: Phosphorus: 6.3 mg/dL — ABNORMAL HIGH (ref 2.5–4.6)

## 2019-11-05 MED ORDER — BISACODYL 10 MG RE SUPP
10.0000 mg | Freq: Once | RECTAL | Status: AC | PRN
  Administered 2019-11-05: 10 mg via RECTAL
  Filled 2019-11-05: qty 1

## 2019-11-05 NOTE — Progress Notes (Signed)
Patient o2 saturations ranged from 89-91% on RA while ambulating in the hall.  Pt stated he did not feel SOB.

## 2019-11-05 NOTE — Progress Notes (Signed)
[facility] Progress Note  Subjective: UOP 1.7 L yesterday, down to 194.8 lbs (195- 205 at home). Pt w/o c/o today. Thirsty.   Vitals:   11/04/19 1515 11/04/19 2024 11/05/19 0506 11/05/19 0650  BP:  (!) 157/73 134/68   Pulse:  71 69   Resp:  18 18   Temp:  97.6 F (36.4 C) 98 F (36.7 C)   TempSrc:  Oral Oral   SpO2: 96% 99% 99% 91%  Weight:    88.4 kg  Height:        Exam: GEN NAD, sitting in chair HEENT EOMI PERRL off O2 NECK flat neck veins PULM clear bilat no rales or wheezing CV RRR no m/r/g ABD soft, nontender, + obese EXT no LE edema NEURO AAO x 3 nonfocal SKIN warm and dry, no rashes   TTE 6/26 with no WMA, EF 55-60%, moderate LVH, and > 50% IVC respiratory variability c/w 3 mm Hg pressure  CXR 6.25 - FINDINGS: Mild to moderate severity infiltrate is seen within the right lung base. There is a small right pleural effusion... There is mild to moderate severity enlargement of the cardiac silhouette   Renal US 6/27 - 9.9/ 11.5 cm kidneys, no hydro, bilat [name]   6/27 UA - negative, 100 prot   Creat 2.3 in April 2021 at [facility]   6/29 CT abd/ chest/ pelvis - IMPRESSION: 1. Small bilateral pleural effusions are noted, right greater than left. 2. Large right lower lobe airspace opacity is noted concerning for pneumonia. Multiple ground-glass opacities are noted in the left upper and lower lobes concerning for multifocal pneumonia. 3. Mild coronary artery calcifications are noted suggesting coronary artery disease. 4. Stable 16 mm left periaortic lymph node is noted which most likely is reactive in etiology. 5. Stable small fat containing right inguinal and periumbilical hernias.  Assessment/ Plan: 1. AoCKD 3b - b/l creat 2.2- 2.4, eGFR 30- 54m/ min.  Creat here 2.7- 2.9.  Got IV lasix for suspected vol overload / SOB and SOB improved, but with rising creatinine and echo results, the Lasix was then held. Pt's SpO2 worsenened along w/ abd swelling and CXR +CHF so we  resumed IV lasix on 6/30 and he diuresed well and is now euvolemic. CXR changes resolved. Bmet shows stable creat 2.9 and K+ up slightly 5.2, will give 1 dose lokelma now and have stopped IV lasix. He is off O2 w/ stable SpO2 > 90% on RA. Okay for dc from renal standpoint. Have him resume his home torsemide 20 mg qd this coming Monday 7/5.  Will ask our office to be sure [place] has f/[place] at [place] Will sign off.  2. Acute resp failure/ RLL infiltrate - per CT pt has bilat LL PNA, sp IV abx switched to po on 7/1. LG temp/ ^WBC resolved.  3. HTN - getting home norvasc/ metoprolol, hydralazine up to 50 tid (100 tid at home). Hold ACEi at dc , his renal MD will be the one to resume when indicated 4. DM 2 - per primary 5. Vertigo - defer to primary, better     Rob Kynlie Jane 11/05/2019, 12:03 PM   Recent Labs  Lab 11/03/19 0531 11/03/19 0531 11/04/19 0450 11/05/19 0433  K 4.8  --  4.9  --   BUN 41*  --  46*  --   CREATININE 2.83*  --  2.83*  --   CALCIUM 7.1*  --  7.6*  --   PHOS 5.9*  --   --   --  HGB 9.8*   < > 10.5* 10.1*   < > = values in this interval not displayed.   Inpatient medications: . amLODipine  10 mg Oral Daily  . aspirin EC  81 mg Oral Daily  . calcium carbonate  1 tablet Oral QHS  . cefdinir  300 mg Oral Q12H  . feeding supplement (ENSURE ENLIVE)  237 mL Oral BID BM  . feeding supplement (PRO-STAT SUGAR FREE 64)  30 mL Oral TID BM  . heparin  5,000 Units Subcutaneous Q8H  . hydrALAZINE  50 mg Oral Q8H  . insulin aspart  0-9 Units Subcutaneous TID WC  . meclizine  25 mg Oral TID  . metoprolol succinate  25 mg Oral Daily  . multivitamin  1 tablet Oral QHS  . polyethylene glycol  17 g Oral Daily  . sodium chloride flush  3 mL Intravenous Q12H  . traZODone  50 mg Oral Once    acetaminophen **OR** acetaminophen, HYDROcodone-acetaminophen, ipratropium-albuterol, ondansetron **OR** ondansetron (ZOFRAN) IV, phenol, senna-docusate

## 2019-11-05 NOTE — Progress Notes (Signed)
Pt's O2 sat 99% on 1L this morning. Attempted to wean to room air. O2 sat 91% on recheck. Pt placed back on 1L .

## 2019-11-05 NOTE — Plan of Care (Signed)

## 2019-11-05 NOTE — Progress Notes (Signed)
Patient sats were 95% RA while sitting in bed. While ambulating they hovered between 93-94%, briefly droping to 91. He stated he did not feel short of breath.  Dr. Jonnie Finner made aware.

## 2019-11-05 NOTE — Progress Notes (Signed)
PROGRESS NOTE    Melvin Mcclure  IFO:[id number] DOB: 1950/05/22 DOA: 10/28/2019 PCP: Nolene Ebbs, MD  Brief Narrative:  67 AAM  DM TY 2 on insulin since 2004 with neuropathy Stage III-IV CKD?  Cardiorenal syndrome followed by Dr. Harrie Jeans of nephrology-previously on home torsemide Diastolic heart failure severe LVH diagnosed 05/2018 Vertigo Admitted 10/28/2019 progressive DOE, S OB, wheeze with low-grade fever Found to be hypoxic 90s requiring 3 L of oxygen EKG showed no changes chest x-ray showed right lobe infiltrate and started on azithromycin ceftriaxone  Assessment & Plan:   Principal Problem:   CAP (community acquired pneumonia) Active Problems:   Type 2 diabetes mellitus with stage 3 chronic kidney disease ([place])   CHF (congestive heart failure) ([place])   1. Multifactorial dyspnea-combination of acute diastolic heart failure and pneumonia a. Lasix resumed by nephrology 7/140 IV twice daily i. Weight is down to 89 kg from 93 on admission ii. -1.7 L iii. All diuretics stopped will resume every other day torsemide 7/5 b. Pneumonia i. For pneumonia-complete antibiotics 7 days and then taper to orals on 7/2 for a total of 10 days ending on 7/5 ii. CXR 7/3 shows good clearance of effusions still persisting right lower lobe infiltrate iii. Ambulate again later today to ensure no need for oxygen as he was winded after ambulation this morning and may require short-term oxygen on discharge 2. Diabetes mellitus type 2 a. Discontinue all sliding scale coverage b. Might need only Lantus going forward c. Continue gabapentin on discharge at low-dose 3. CKD 3 prior cardiorenal syndrome 4. Hyperkalemia mild a. Nephrology aware b. Defer binders as an outpatient 5. HTN a. Continue amlodipine 5 metoprolol 25 succinate hydralazine can increase to 100 3 times daily on discharge b. ACE has been held because of renal insufficiency 6. Vertigo 7. Not BPPV per PT-? DDX MEnier's a. Could  trial Betahistine-not available on formulary and patient symptoms are improved b. Already on diuretics   DVT prophylaxis:  Code Status: Full  Family Communication: Wife and I had a long discussion on the phone on 7/3 and we will probably plan for discharge tomorrow  disposition:   Status is: Inpatient  Remains inpatient appropriate because:Persistent severe electrolyte disturbances, Ongoing diagnostic testing needed not appropriate for outpatient work up and IV treatments appropriate due to intensity of illness or inability to take PO   Dispo:  Patient From: Home  Planned Disposition: Home  Expected discharge date I expect he will be here through Sunday 7/4  Medically stable for discharge: No   Consultants:   Nephrology  Procedures: Multiple CTs  Antimicrobials: Ceftriaxone and doxycycline as above Transition to [facility] 7/1  Subjective:  Did well with ambulation but seemed winded and tells me he feels short of breath and put on the oxygen himself No chest pain No fever No chills  Objective: Vitals:   11/04/19 2024 11/05/19 0506 11/05/19 0650 11/05/19 1239  BP: (!) 157/73 134/68  (!) 156/83  Pulse: 71 69  68  Resp: 18 18  18   Temp: 97.6 F (36.4 C) 98 F (36.7 C)  97.7 F (36.5 C)  TempSrc: Oral Oral  Oral  SpO2: 99% 99% 91% 98%  Weight:   88.4 kg   Height:        Intake/Output Summary (Last 24 hours) at 11/05/2019 1642 Last data filed at 11/05/2019 1241 Gross per 24 hour  Intake 836 ml  Output 2000 ml  Net -1164 ml   Filed Weights   11/03/19  0500 11/04/19 0545 11/05/19 0650  Weight: 91.5 kg 89.4 kg 88.4 kg    Examination:  No distress Mild crackles bilateral and posteriorly Abdomen is softer no organomegaly and edema is improved ROM intact no lower extremity swelling Edema Neurologically intact moving all 4 limbs equally smile symmetric No rash cannot appreciate JVD   Data Reviewed: I have personally reviewed following labs and imaging  studies Potassium  5.2 BUN/creatinine stable in the 49/2.9 Phosphorus 6.3 Calcium 7.9 Hemoglobin 10.1 platelet 267 Radiology Studies: DG Chest 2 View  Result Date: 11/05/2019 CLINICAL DATA:  Dizziness and shortness of breath. EXAM: CHEST - 2 VIEW COMPARISON:  11/01/2019 and 12/15/2018 FINDINGS: Lungs are adequately inflated with mild focal opacification over the posterior right base which may be due to atelectasis or infection. Possible small amount of posterior pleural fluid. Cardiomediastinal silhouette and remainder of the exam is unchanged. IMPRESSION: Opacification over the posterior right base which may be due to atelectasis or infection. Possible small amount of posterior pleural fluid. Electronically Signed   By: Marin Olp M.D.   On: 11/05/2019 09:53     Scheduled Meds: . amLODipine  10 mg Oral Daily  . aspirin EC  81 mg Oral Daily  . calcium carbonate  1 tablet Oral QHS  . cefdinir  300 mg Oral Q12H  . feeding supplement (ENSURE ENLIVE)  237 mL Oral BID BM  . feeding supplement (PRO-STAT SUGAR FREE 64)  30 mL Oral TID BM  . heparin  5,000 Units Subcutaneous Q8H  . hydrALAZINE  50 mg Oral Q8H  . meclizine  25 mg Oral TID  . metoprolol succinate  25 mg Oral Daily  . multivitamin  1 tablet Oral QHS  . polyethylene glycol  17 g Oral Daily  . sodium chloride flush  3 mL Intravenous Q12H  . traZODone  50 mg Oral Once   Continuous Infusions:    LOS: 8 days    Time spent: 40  I spent 15 minutes on the phone with wife explaining the plan of care  Nita Sells, MD [facility] To contact the attending provider between 7A-7P or the covering provider during after hours 7P-7A, please log into the web site [email or web address] and access using universal [place] password for that web site. If you do not have the password, please call the hospital operator.  11/05/2019, 4:42 PM

## 2019-11-06 LAB — BASIC METABOLIC PANEL
Anion gap: 10 (ref 5–15)
BUN: 51 mg/dL — ABNORMAL HIGH (ref 8–23)
CO2: 27 mmol/L (ref 22–32)
Calcium: 7.5 mg/dL — ABNORMAL LOW (ref 8.9–10.3)
Chloride: 103 mmol/L (ref 98–111)
Creatinine, Ser: 2.94 mg/dL — ABNORMAL HIGH (ref 0.61–1.24)
GFR calc Af Amer: 24 mL/min — ABNORMAL LOW (ref >60)
GFR calc non Af Amer: 21 mL/min — ABNORMAL LOW (ref >60)
Glucose, Bld: 136 mg/dL — ABNORMAL HIGH (ref 70–99)
Potassium: 4.8 mmol/L (ref 3.5–5.1)
Sodium: 140 mmol/L (ref 135–145)

## 2019-11-06 LAB — GLUCOSE, CAPILLARY: Glucose-Capillary: 145 mg/dL — ABNORMAL HIGH (ref 70–99)

## 2019-11-06 MED ORDER — CEFDINIR 300 MG PO CAPS: 300.0000 mg | ORAL_CAPSULE | Freq: Two times a day (BID) | ORAL | 0 refills | Status: DC

## 2019-11-06 MED ORDER — TORSEMIDE 20 MG PO TABS: 20.0000 mg | ORAL_TABLET | ORAL | 0 refills | Status: DC

## 2019-11-06 NOTE — Progress Notes (Signed)
Pt ambulated independently to either ends of the hall accompanied by RN. Tolerated well. Oxygen saturations remained greater than 92% throughout ambulation and upon return to pt room. No s/s of distress at this time.

## 2019-11-06 NOTE — Discharge Summary (Signed)
Physician Discharge Summary  Melvin Mcclure QMG:[id number] DOB: [birth year] DOA: 10/28/2019  PCP: Nolene Ebbs, MD  Admit date: 10/28/2019 Discharge date: 11/06/2019  Time spent: 40 minutes  Recommendations for Outpatient Follow-up:  1. Complete antibiotics in the outpatient setting-needs x-ray in about 3 weeks to confirm clearing of infiltrates 2. Needs renal panel and CBC in about 1 week-consider referral back to nephrologist for further discussions about CKD 3. Note dosage changes of various meds including torsemide which is now every other day 4. Consider titration of hydralazine versus other alpha-blocker in the outpatient setting for blood pressure 5. Will require A1c in about 1 to 2 months-is not taking insulin and is controlling his diabetes with his diet 6.   Discharge Diagnoses:  Principal Problem:   CAP (community acquired pneumonia) Active Problems:   Type 2 diabetes mellitus with stage 3 chronic kidney disease (HCC)   CHF (congestive heart failure) ([place])   Discharge Condition: Improved  Diet recommendation: Heart healthy diabetic low-salt  Filed Weights   11/04/19 0545 11/05/19 0650 11/06/19 0627  Weight: 89.4 kg 88.4 kg 87.3 kg    History of present illness:  69 AAM  DM TY 2 on insulin since 2004 with neuropathy Stage III-IV CKD?  Cardiorenal syndrome followed by Dr. Harrie Jeans of nephrology-previously on home torsemide Diastolic heart failure severe LVH diagnosed 05/2018 Vertigo Admitted 10/28/2019 progressive DOE, S OB, wheeze with low-grade fever Found to be hypoxic 90s requiring 3 L of oxygen EKG showed no changes chest x-ray showed right lobe infiltrate and started on azithromycin ceftriaxone   Hospital Course:  1. Multifactorial dyspnea-combination of acute diastolic heart failure and pneumonia a. Lasix resumed by nephrology 7/140 IV twice daily i. Weight 93-->87 kg on discharge ii. - -2.3 L iii. All diuretics stopped will resume every other day  torsemide 7/5 and outpatient follow-up with nephrology regarding dosing of medications in the outpatient setting b. Pneumonia i. For pneumonia-complete antibiotics 7 days and then taper to orals on 7/2 for a total of 10 days ending on 7/5 ii. CXR 7/3 shows good clearance of effusions still persisting right lower lobe infiltrate-needs outpatient chest x-ray in about 1 month iii. No oxygen requirements by discharge 2. Diabetes mellitus type 2 a. Does not take any insulins at home and is going to control it with diet per him b. Continue gabapentin on discharge at low-dose 3. CKD 3 prior cardiorenal syndrome 4. Hyperkalemia mild a. Nephrology aware b. Defer binders as an outpatient c. Consider increase of hydralazine from 50-100 if blood pressure remains high 5. HTN a. Continue amlodipine 5 metoprolol 25 succinate and he is ambulatory b. ACE has been held because of renal insufficiency 6. Vertigo 7. Not BPPV per PT-? DDX MEnier's a. Could trial Betahistine-not available on formulary and patient symptoms are improved b. Already on diuretics  Procedures:  Multiple nephrology Dr. Burnett Sheng  Discharge Exam: Vitals:   11/06/19 0832 11/06/19 0849  BP:  (!) 150/76  Pulse:  73  Resp:  16  Temp:  98.5 F (36.9 C)  SpO2: 94% 95%   Awake alert pleasant walked the hallway without oxygen seems ready to go home is in no distress no chest pain no fever Otherwise is doing well  General: Coherent Cardiovascular: S1-S2 no murmur rub or gallop no rales no rhonchi Respiratory: Clear no rales Abdomen soft no rebound no guarding  Discharge Instructions   Discharge Instructions    Diet - low sodium heart healthy   Complete by: As directed  Discharge instructions   Complete by: As directed    Make sure you finish all of your antibiotics Check your sugars periodically You will notice some new medications of changed to put your list carefully for example some your blood pressure meds have been  held because they can affect your kidneys Your blood pressure is slightly high and you may need to go back up on your hydralazine dosing in the outpatient this can be discussed with your primary physician and nephrologist You will go to every other day dosing of torsemide and check your weight daily, if you gain more than 2 pounds in 24 hours or 5 pounds in a week I would recommend taking that extra dose that day Please stay away from salty foods and please limit fluids to about 1800 cc in the summer and 1500 cc in the winter (12 cups of water and ten 8 ounce cups of water in the winter)   Increase activity slowly   Complete by: As directed    Increase activity slowly   Complete by: As directed      Allergies as of 11/06/2019   No Known Allergies     Medication List    STOP taking these medications   insulin aspart 100 UNIT/ML injection Commonly known as: novoLOG   insulin glargine 100 UNIT/ML injection Commonly known as: LANTUS   lisinopril 10 MG tablet Commonly known as: ZESTRIL   metoCLOPramide 10 MG tablet Commonly known as: REGLAN   Vitamin D (Ergocalciferol) 1.25 MG (50000 UNIT) Caps capsule Commonly known as: DRISDOL     TAKE these medications   amLODipine 10 MG tablet Commonly known as: NORVASC Take 10 mg by mouth daily.   aspirin 81 MG tablet Take 81 mg by mouth daily.   calcitRIOL 0.25 MCG capsule Commonly known as: ROCALTROL Take 0.25 mcg by mouth 2 (two) times a week.   calcium carbonate 500 MG chewable tablet Commonly known as: TUMS - dosed in mg elemental calcium Chew 1 tablet by mouth daily.   cefdinir 300 MG capsule Commonly known as: OMNICEF Take 1 capsule (300 mg total) by mouth every 12 (twelve) hours.   ferrous sulfate 325 (65 FE) MG tablet Take 325 mg by mouth daily with breakfast.   gabapentin 300 MG capsule Commonly known as: NEURONTIN Take 300 mg by mouth 3 (three) times daily. What changed: Another medication with the same name was  removed. Continue taking this medication, and follow the directions you see here.   hydrALAZINE 50 MG tablet Commonly known as: APRESOLINE Take 1 tablet (50 mg total) by mouth every 8 (eight) hours. What changed: Another medication with the same name was removed. Continue taking this medication, and follow the directions you see here.   meclizine 25 MG tablet Commonly known as: ANTIVERT Take 25 mg by mouth 3 (three) times daily as needed for dizziness or nausea.   metoprolol succinate 25 MG 24 hr tablet Commonly known as: TOPROL-XL Take 25 mg by mouth daily.   senna-docusate 8.6-50 MG tablet Commonly known as: Senokot-S Take 1 tablet by mouth 2 (two) times daily.   torsemide 20 MG tablet Commonly known as: DEMADEX Take 1 tablet (20 mg total) by mouth every other day. What changed: when to take this      No Known Gratis. Go to.   Specialty: Rehabilitation Why: The office will call you to set up appt. Contact information: [address]  [place] [address] [phone]               The results of significant diagnostics from this hospitalization (including imaging, microbiology, ancillary and laboratory) are listed below for reference.    Significant Diagnostic Studies: CT ABDOMEN PELVIS WO CONTRAST  Result Date: 11/01/2019 CLINICAL DATA:  Dyspnea on exertion, nausea, vomiting. EXAM: CT CHEST, ABDOMEN AND PELVIS WITHOUT CONTRAST TECHNIQUE: Multidetector CT imaging of the chest, abdomen and pelvis was performed following the standard protocol without IV contrast. COMPARISON:  Sep 25, 2015.  December 16, 2018. FINDINGS: CT CHEST FINDINGS Cardiovascular: Mild coronary artery calcifications are noted. Atherosclerosis of thoracic aorta is noted without aneurysm formation. Normal cardiac size. No pericardial effusion. Mediastinum/Nodes: No enlarged  mediastinal, hilar, or axillary lymph nodes. Thyroid gland, trachea, and esophagus demonstrate no significant findings. Lungs/Pleura: Small bilateral pleural effusions are noted, right greater than left. No pneumothorax is noted. Large right lower lobe airspace opacity is noted concerning for pneumonia. Multiple ground-glass opacities are noted in the left upper and lower lobes concerning for multifocal pneumonia. Mild left posterior basilar subsegmental atelectasis is noted. Musculoskeletal: No chest wall mass or suspicious bone lesions identified. CT ABDOMEN PELVIS FINDINGS Hepatobiliary: No focal liver abnormality is seen. No gallstones, gallbladder wall thickening, or biliary dilatation. Pancreas: Unremarkable. No pancreatic ductal dilatation or surrounding inflammatory changes. Spleen: Normal in size without focal abnormality. Adrenals/Urinary Tract: Adrenal glands are unremarkable. Kidneys are normal, without renal calculi, focal lesion, or hydronephrosis. Bladder is unremarkable. Stomach/Bowel: Stomach is within normal limits. Appendix appears normal. No evidence of bowel wall thickening, distention, or inflammatory changes. Vascular/Lymphatic: No significant vascular abnormality is noted. Stable 16 mm left periaortic lymph node is noted which most likely is reactive in etiology. Reproductive: Prostate is unremarkable. Other: Stable small fat containing right inguinal hernia is noted. Small fat containing periumbilical hernia is noted. No significant ascites is noted. Musculoskeletal: No acute or significant osseous findings. IMPRESSION: 1. Small bilateral pleural effusions are noted, right greater than left. 2. Large right lower lobe airspace opacity is noted concerning for pneumonia. Multiple ground-glass opacities are noted in the left upper and lower lobes concerning for multifocal pneumonia. 3. Mild coronary artery calcifications are noted suggesting coronary artery disease. 4. Stable 16 mm left  periaortic lymph node is noted which most likely is reactive in etiology. 5. Stable small fat containing right inguinal and periumbilical hernias. Aortic Atherosclerosis (ICD10-I70.0). Electronically Signed   By: Marijo Conception M.D.   On: 11/01/2019 08:59   DG Chest 2 View  Result Date: 11/05/2019 CLINICAL DATA:  Dizziness and shortness of breath. EXAM: CHEST - 2 VIEW COMPARISON:  11/01/2019 and 12/15/2018 FINDINGS: Lungs are adequately inflated with mild focal opacification over the posterior right base which may be due to atelectasis or infection. Possible small amount of posterior pleural fluid. Cardiomediastinal silhouette and remainder of the exam is unchanged. IMPRESSION: Opacification over the posterior right base which may be due to atelectasis or infection. Possible small amount of posterior pleural fluid. Electronically Signed   By: Marin Olp M.D.   On: 11/05/2019 09:53   DG Chest 2 View  Result Date: 11/01/2019 CLINICAL DATA:  Shortness of breath EXAM: CHEST - 2 VIEW COMPARISON:  CT from earlier in the same day. FINDINGS: Cardiac shadow is enlarged but stable. Diffuse airspace opacities are noted bilaterally but worst in the right base similar to that noted on prior CT examination. Associated bilateral effusions are seen. No bony abnormality is seen.  IMPRESSION: Multifocal pneumonia worst in the right base with associated effusions. Electronically Signed   By: Inez Catalina M.D.   On: 11/01/2019 11:10   CT HEAD WO CONTRAST  Result Date: 10/28/2019 CLINICAL DATA:  Dizziness, intermittent nausea and vomiting EXAM: CT HEAD WITHOUT CONTRAST TECHNIQUE: Contiguous axial images were obtained from the base of the skull through the vertex without intravenous contrast. COMPARISON:  03/07/2017 FINDINGS: Brain: Chronic small vessel ischemic changes within the periventricular white matter most pronounced in the right frontal lobe. No acute infarct or hemorrhage. Lateral ventricles and remaining midline  structures are unremarkable. No acute extra-axial fluid collections. No mass effect. Vascular: No hyperdense vessel or unexpected calcification. Skull: Normal. Negative for fracture or focal lesion. Sinuses/Orbits: No acute finding. Other: None. IMPRESSION: 1. Stable head CT, no acute process. Electronically Signed   By: Randa Ngo M.D.   On: 10/28/2019 19:07   CT CHEST WO CONTRAST  Result Date: 11/01/2019 CLINICAL DATA:  Dyspnea on exertion, nausea, vomiting. EXAM: CT CHEST, ABDOMEN AND PELVIS WITHOUT CONTRAST TECHNIQUE: Multidetector CT imaging of the chest, abdomen and pelvis was performed following the standard protocol without IV contrast. COMPARISON:  Sep 25, 2015.  December 16, 2018. FINDINGS: CT CHEST FINDINGS Cardiovascular: Mild coronary artery calcifications are noted. Atherosclerosis of thoracic aorta is noted without aneurysm formation. Normal cardiac size. No pericardial effusion. Mediastinum/Nodes: No enlarged mediastinal, hilar, or axillary lymph nodes. Thyroid gland, trachea, and esophagus demonstrate no significant findings. Lungs/Pleura: Small bilateral pleural effusions are noted, right greater than left. No pneumothorax is noted. Large right lower lobe airspace opacity is noted concerning for pneumonia. Multiple ground-glass opacities are noted in the left upper and lower lobes concerning for multifocal pneumonia. Mild left posterior basilar subsegmental atelectasis is noted. Musculoskeletal: No chest wall mass or suspicious bone lesions identified. CT ABDOMEN PELVIS FINDINGS Hepatobiliary: No focal liver abnormality is seen. No gallstones, gallbladder wall thickening, or biliary dilatation. Pancreas: Unremarkable. No pancreatic ductal dilatation or surrounding inflammatory changes. Spleen: Normal in size without focal abnormality. Adrenals/Urinary Tract: Adrenal glands are unremarkable. Kidneys are normal, without renal calculi, focal lesion, or hydronephrosis. Bladder is unremarkable.  Stomach/Bowel: Stomach is within normal limits. Appendix appears normal. No evidence of bowel wall thickening, distention, or inflammatory changes. Vascular/Lymphatic: No significant vascular abnormality is noted. Stable 16 mm left periaortic lymph node is noted which most likely is reactive in etiology. Reproductive: Prostate is unremarkable. Other: Stable small fat containing right inguinal hernia is noted. Small fat containing periumbilical hernia is noted. No significant ascites is noted. Musculoskeletal: No acute or significant osseous findings. IMPRESSION: 1. Small bilateral pleural effusions are noted, right greater than left. 2. Large right lower lobe airspace opacity is noted concerning for pneumonia. Multiple ground-glass opacities are noted in the left upper and lower lobes concerning for multifocal pneumonia. 3. Mild coronary artery calcifications are noted suggesting coronary artery disease. 4. Stable 16 mm left periaortic lymph node is noted which most likely is reactive in etiology. 5. Stable small fat containing right inguinal and periumbilical hernias. Aortic Atherosclerosis (ICD10-I70.0). Electronically Signed   By: Marijo Conception M.D.   On: 11/01/2019 08:59   US RENAL  Result Date: 10/30/2019 CLINICAL DATA:  Acute kidney injury, hypertension, type II diabetes mellitus, history CHF EXAM: RENAL / URINARY TRACT ULTRASOUND COMPLETE COMPARISON:  CT abdomen and pelvis 12/16/2018 FINDINGS: Right Kidney: Renal measurements: 9.9 x 4.7 x 4.2 cm = volume: 102 mL. Normal cortical thickness. Increased cortical echogenicity. No mass, hydronephrosis or shadowing calcification  identified. Left Kidney: Renal measurements: 11.5 x 4.9 x 5.0 cm = volume: 150 mL. Normal cortical thickness. Increased cortical echogenicity. No mass, hydronephrosis or shadowing calcification identified. Bladder: Unremarkable Other: Suboptimal visualization of lower pole RIGHT kidney and upper pole LEFT kidney due to bowel gas.  IMPRESSION: Medical renal disease changes of both kidneys. No evidence of renal mass or hydronephrosis. Electronically Signed   By: Lavonia Dana M.D.   On: 10/30/2019 09:44   DG Chest Port 1 View  Result Date: 10/28/2019 CLINICAL DATA:  Shortness of breath. EXAM: PORTABLE CHEST 1 VIEW COMPARISON:  December 15, 2018 FINDINGS: Mild to moderate severity infiltrate is seen within the right lung base. There is a small right pleural effusion. No pneumothorax is identified. There is mild to moderate severity enlargement of the cardiac silhouette. The visualized skeletal structures are unremarkable. IMPRESSION: Mild to moderate severity right basilar infiltrate with a small right pleural effusion. Electronically Signed   By: Virgina Norfolk M.D.   On: 10/28/2019 15:57   ECHOCARDIOGRAM COMPLETE  Result Date: 10/29/2019    ECHOCARDIOGRAM REPORT   Patient Name:   Melvin Mcclure Date of Exam: 10/29/2019 Medical Rec #:  [id number]      Height:       70.0 in Accession #:    [id number]     Weight:       204.0 lb Date of Birth:  1950-10-15      BSA:          2.105 m Patient Age:    7 years       BP:           157/79 mmHg Patient Gender: M              HR:           75 bpm. Exam Location:  Inpatient Procedure: 2D Echo, Cardiac Doppler, Color Doppler and Intracardiac            Opacification Agent Indications:    Acute Respiratory Insufficiency 518.82 / R06.89  History:        Patient has prior history of Echocardiogram examinations, most                 recent 05/29/2018. CHF; Risk Factors:Hypertension, Diabetes and                 Dyslipidemia.  Sonographer:    Tiffany Dance Referring Phys: [id number] [place]  1. Left ventricular ejection fraction, by estimation, is 55 to 60%. The left ventricle has normal function. The left ventricle has no regional wall motion abnormalities. There is moderate left ventricular hypertrophy. Left ventricular diastolic parameters are indeterminate.  2. Right ventricular  systolic function is normal. The right ventricular size is normal. Tricuspid regurgitation signal is inadequate for assessing PA pressure.  3. The mitral valve is grossly normal. Mild mitral valve regurgitation.  4. The aortic valve is tricuspid, mildly calcified. Aortic valve regurgitation is not visualized.  5. The inferior vena cava is normal in size with greater than 50% respiratory variability, suggesting right atrial pressure of 3 mmHg.  6. Trivial to small circumferential pericardial effusion. FINDINGS  Left Ventricle: Left ventricular ejection fraction, by estimation, is 55 to 60%. The left ventricle has normal function. The left ventricle has no regional wall motion abnormalities. Definity contrast agent was given IV to delineate the left ventricular  endocardial borders. The left ventricular internal cavity size was normal in size. There is moderate left ventricular hypertrophy.  Left ventricular diastolic parameters are indeterminate. Right Ventricle: The right ventricular size is normal. No increase in right ventricular wall thickness. Right ventricular systolic function is normal. Tricuspid regurgitation signal is inadequate for assessing PA pressure. Left Atrium: Left atrial size was normal in size. Right Atrium: Right atrial size was normal in size. Pericardium: Trivial pericardial effusion is present. The pericardial effusion is circumferential. Mitral Valve: The mitral valve is grossly normal. Mild mitral valve regurgitation. Tricuspid Valve: The tricuspid valve is grossly normal. Tricuspid valve regurgitation is mild. Aortic Valve: The aortic valve is tricuspid. Aortic valve regurgitation is not visualized. There is mild calcification of the aortic valve. Pulmonic Valve: The pulmonic valve was grossly normal. Pulmonic valve regurgitation is trivial. Aorta: The aortic root is normal in size and structure. Venous: The inferior vena cava is normal in size with greater than 50% respiratory variability,  suggesting right atrial pressure of 3 mmHg. IAS/Shunts: No atrial level shunt detected by color flow Doppler.  LEFT VENTRICLE PLAX 2D LVIDd:         4.60 cm  Diastology LVIDs:         3.30 cm  LV e' lateral:   8.38 cm/s LV PW:         1.60 cm  LV E/e' lateral: 11.1 LV IVS:        1.40 cm  LV e' medial:    6.85 cm/s LVOT diam:     2.10 cm  LV E/e' medial:  13.5 LV SV:         79 LV SV Index:   38 LVOT Area:     3.46 cm  RIGHT VENTRICLE             IVC RV Basal diam:  3.20 cm     IVC diam: 1.90 cm RV Mid diam:    2.60 cm RV S prime:     12.90 cm/s TAPSE (M-mode): 2.4 cm LEFT ATRIUM              Index       RIGHT ATRIUM           Index LA diam:        4.40 cm  2.09 cm/m  RA Area:     16.00 cm LA Vol (A2C):   106.0 ml 50.36 ml/m RA Volume:   39.20 ml  18.62 ml/m LA Vol (A4C):   56.9 ml  27.03 ml/m LA Biplane Vol: 77.8 ml  36.96 ml/m  AORTIC VALVE LVOT Vmax:   105.00 cm/s LVOT Vmean:  64.600 cm/s LVOT VTI:    0.229 m  AORTA Ao Root diam: 3.10 cm Ao Asc diam:  3.10 cm MITRAL VALVE MV Area (PHT): 2.76 cm    SHUNTS MV Decel Time: 275 msec    Systemic VTI:  0.23 m MV E velocity: 92.70 cm/s  Systemic Diam: 2.10 cm MV A velocity: 56.80 cm/s MV E/A ratio:  1.63 Rozann Lesches MD Electronically signed by Rozann Lesches MD Signature Date/Time: 10/29/2019/2:28:25 PM    Final    VAS [place] LOWER EXTREMITY VENOUS (DVT)  Result Date: 10/29/2019  Lower Venous DVTStudy Indications: Elevated Ddimer.  Risk Factors: None identified. Comparison Study: No prior studies. Performing Technologist: Oliver Hum RVT  Examination Guidelines: A complete evaluation includes B-mode imaging, spectral Doppler, color Doppler, and power Doppler as needed of all accessible portions of each vessel. Bilateral testing is considered an integral part of a complete examination. Limited examinations for reoccurring indications may be performed as  noted. The reflux portion of the exam is performed with the patient in reverse Trendelenburg.   +---------+---------------+---------+-----------+----------+--------------+ RIGHT    CompressibilityPhasicitySpontaneityPropertiesThrombus Aging +---------+---------------+---------+-----------+----------+--------------+ CFV      Full           Yes      Yes                                 +---------+---------------+---------+-----------+----------+--------------+ SFJ      Full                                                        +---------+---------------+---------+-----------+----------+--------------+ FV Prox  Full                                                        +---------+---------------+---------+-----------+----------+--------------+ FV Mid   Full                                                        +---------+---------------+---------+-----------+----------+--------------+ FV DistalFull                                                        +---------+---------------+---------+-----------+----------+--------------+ PFV      Full                                                        +---------+---------------+---------+-----------+----------+--------------+ POP      Full           Yes      Yes                                 +---------+---------------+---------+-----------+----------+--------------+ PTV      Full                                                        +---------+---------------+---------+-----------+----------+--------------+ PERO     Full                                                        +---------+---------------+---------+-----------+----------+--------------+   +---------+---------------+---------+-----------+----------+--------------+ LEFT     CompressibilityPhasicitySpontaneityPropertiesThrombus Aging +---------+---------------+---------+-----------+----------+--------------+ CFV      Full           Yes  Yes                                  +---------+---------------+---------+-----------+----------+--------------+ SFJ      Full                                                        +---------+---------------+---------+-----------+----------+--------------+ FV Prox  Full                                                        +---------+---------------+---------+-----------+----------+--------------+ FV Mid   Full                                                        +---------+---------------+---------+-----------+----------+--------------+ FV DistalFull                                                        +---------+---------------+---------+-----------+----------+--------------+ PFV      Full                                                        +---------+---------------+---------+-----------+----------+--------------+ POP      Full           Yes      Yes                                 +---------+---------------+---------+-----------+----------+--------------+ PTV      Full                                                        +---------+---------------+---------+-----------+----------+--------------+ PERO     Full                                                        +---------+---------------+---------+-----------+----------+--------------+     Summary: RIGHT: - There is no evidence of deep vein thrombosis in the lower extremity.  - No cystic structure found in the popliteal fossa.  LEFT: - There is no evidence of deep vein thrombosis in the lower extremity.  - No cystic structure found in the popliteal fossa.  *See table(s) above for measurements and observations. Electronically signed by Deitra Mayo MD on 10/29/2019 at 6:50:22 PM.    Final  Microbiology: Recent Results (from the past 240 hour(s))  Culture, blood (routine x 2)     Status: None   Collection Time: 10/28/19  3:27 PM   Specimen: BLOOD  Result Value Ref Range Status   Specimen Description   Final     BLOOD LEFT ANTECUBITAL Performed at [facility] [address], [place], [place] [address]    Special Requests   Final    BOTTLES DRAWN AEROBIC AND ANAEROBIC Blood Culture results may not be optimal due to an excessive volume of blood received in culture bottles Performed at [facility] [address], [place], [place] [address]    Culture   Final    NO GROWTH 5 DAYS Performed at [facility], [place] [address], [place], [place] [address]    Report Status 11/02/2019 FINAL  Final  Culture, blood (routine x 2)     Status: None   Collection Time: 10/28/19  3:27 PM   Specimen: BLOOD  Result Value Ref Range Status   Specimen Description   Final    BLOOD RIGHT ANTECUBITAL Performed at [facility] [address], [place], [place] [address]    Special Requests   Final    BOTTLES DRAWN AEROBIC AND ANAEROBIC Blood Culture adequate volume Performed at [facility] [address], [place], [place] [address]    Culture   Final    NO GROWTH 5 DAYS Performed at [facility], [place] [address], [place], [place] [address]    Report Status 11/02/2019 FINAL  Final  SARS Coronavirus 2 by RT PCR (hospital order, performed in [facility] hospital lab) Nasopharyngeal Nasopharyngeal Swab     Status: None   Collection Time: 10/28/19  3:27 PM   Specimen: Nasopharyngeal Swab  Result Value Ref Range Status   SARS Coronavirus 2 NEGATIVE NEGATIVE Final    Comment: (NOTE) SARS-CoV-2 target nucleic acids are NOT DETECTED.  The SARS-CoV-2 RNA is generally detectable in upper and lower respiratory specimens during the acute phase of infection. The lowest concentration of SARS-CoV-2 viral copies this assay can detect is 250 copies / mL. A negative result does not preclude SARS-CoV-2 infection and should not be used as the sole basis for treatment or other patient management decisions.  A negative result may occur  with improper specimen collection / handling, submission of specimen other than nasopharyngeal swab, presence of viral mutation(s) within the areas targeted by this assay, and inadequate number of viral copies (<250 copies / mL). A negative result must be combined with clinical observations, patient history, and epidemiological information.  Fact Sheet for Patients:   StrictlyIdeas.no  Fact Sheet for Healthcare Providers: BankingDealers.co.za  This test is not yet approved or  cleared by the [place] FDA and has been authorized for detection and/or diagnosis of SARS-CoV-2 by FDA under an Emergency Use Authorization (EUA).  This EUA will remain in effect (meaning this test can be used) for the duration of the COVID-19 declaration under Section 564(b)(1) of the Act, 21 U.S.C. section 360bbb-3(b)(1), unless the authorization is terminated or revoked sooner.  Performed at [facility], [place] [address] [place], [place] [address]   Urine Culture     Status: None   Collection Time: 10/30/19  7:38 AM   Specimen: Urine, Random  Result Value Ref Range Status   Specimen Description   Final    URINE, RANDOM Performed at [facility]  [address] [place], [place] [address]    Special Requests   Final    NONE Performed at [facility], [place] [address], [place], [place] [address]    Culture   Final    NO GROWTH Performed at [facility], [place] [address], [place], [place] [address]    Report Status 10/31/2019 FINAL  Final     Labs: Basic Metabolic Panel: Recent Labs  Lab 11/02/19 0538 11/03/19 0531 11/04/19 0450 11/05/19 1233 11/05/19 1344 11/06/19 0539  NA 140 140 141 140  --  140  K 5.2* 4.8 4.9 5.2*  --  4.8  CL 106 106 105 102  --  103  CO2 22 25 26 27   --  27  GLUCOSE 119* 138* 129* 195*  --  136*  BUN 39* 41* 46* 49*  --  51*  CREATININE 2.98* 2.83*  2.83* 2.95*  --  2.94*  CALCIUM 6.9* 7.1* 7.6* 7.7*  --  7.5*  PHOS  --  5.9*  --   --  6.3*  --    Liver Function Tests: Recent Labs  Lab 11/03/19 0531  ALBUMIN 3.0*   No results for input(s): LIPASE, AMYLASE in the last 168 hours. No results for input(s): AMMONIA in the last 168 hours. CBC: Recent Labs  Lab 11/01/19 0519 11/02/19 0538 11/03/19 0531 11/04/19 0450 11/05/19 0433  WBC 7.9 7.8 8.0 8.8 8.3  NEUTROABS 5.0 4.8 4.8 4.7 5.1  HGB 9.3* 9.6* 9.8* 10.5* 10.1*  HCT 30.7* 31.5* 31.7* 34.7* 32.7*  MCV 89.0 88.2 87.1 88.1 88.1  PLT 233 255 244 293 267   Cardiac Enzymes: No results for input(s): CKTOTAL, CKMB, CKMBINDEX, TROPONINI in the last 168 hours. BNP: BNP (last 3 results) Recent Labs    10/28/19 1527  BNP 745.1*    ProBNP (last 3 results) No results for input(s): PROBNP in the last 8760 hours.  CBG: Recent Labs  Lab 11/05/19 1446 11/05/19 1515 11/05/19 1615 11/05/19 2022 11/06/19 0758  GLUCAP 65* 136* 158* 216* 145*       Signed:  Nita Sells MD   [facility] 11/06/2019, 9:09 AM

## 2019-12-15 ENCOUNTER — Other Ambulatory Visit (HOSPITAL_COMMUNITY): Payer: Self-pay | Admitting: *Deleted

## 2019-12-15 NOTE — Discharge Instructions (Signed)

## 2019-12-16 ENCOUNTER — Encounter (HOSPITAL_COMMUNITY)
Admission: RE | Admit: 2019-12-16 | Discharge: 2019-12-16 | Disposition: A | Discharge status: Home / Self Care | Payer: Medicare Other | Source: Ambulatory Visit | Attending: Nephrology | Admitting: Nephrology

## 2019-12-16 DIAGNOSIS — Z539 Procedure and treatment not carried out, unspecified reason: Secondary | ICD-10-CM | POA: Insufficient documentation

## 2019-12-16 DIAGNOSIS — D631 Anemia in chronic kidney disease: Secondary | ICD-10-CM | POA: Insufficient documentation

## 2019-12-16 DIAGNOSIS — N184 Chronic kidney disease, stage 4 (severe): Secondary | ICD-10-CM | POA: Insufficient documentation

## 2019-12-16 MED ORDER — SODIUM CHLORIDE 0.9 % IV SOLN
510.0000 mg | INTRAVENOUS | Status: DC
  Administered 2019-12-16: 510 mg via INTRAVENOUS
  Filled 2019-12-16: qty 17

## 2019-12-20 NOTE — Progress Notes (Deleted)
Cardiology Clinic Note   Patient Name: Melvin Mcclure Date of Encounter: 12/20/2019  Primary Care Provider:  Nolene Ebbs, MD Primary Cardiologist:  Kirk Ruths, MD  Patient Profile    Melvin Mcclure 69 year old male presents the clinic today for follow-up evaluation of his acute on chronic diastolic CHF, HTN, and type 2 diabetes.  Past Medical History    Past Medical History:  Diagnosis Date  . CHF (congestive heart failure) ([place]) 05/28/2018   dx 05/28/18  . Diabetes mellitus without complication ([place])   . Hyperlipidemia   . Hypertension   . Perforating neurotrophic ulcer of foot ([place])    Past Surgical History:  Procedure Laterality Date  . ACHILLES TENDON REPAIR    . CYST EXCISION      Allergies  No Known Allergies  History of Present Illness   [name] has a PMH of  diastolic CHF, hypertensive cardiomyopathy, community-acquired pneumonia, hypertension, and type 2 diabetes with CKD stage III.  Previous EKG showed inferior Q waves.  No prior history of coronary artery disease.  His creatinine baseline around 2.5.  Echocardiogram showed an LVEF of 60%, no wall motion abnormalities and severe LVH.  His nuclear stress test 07/07/2018 showed an EF of 57% no ST segment deviation and low risk.  Recently admitted to the hospital 10/28/19-11/06/19 with community-acquired pneumonia/acute on chronic diastolic CHF.  He was admitted with low-grade fever, found to be hypoxic in the 90s requiring 3 L of oxygen, EKG showed no changes chest x-ray showed right lobe infiltrates.  He received furosemide, azithromycin and ceftriaxone.  Discharge weight loss 87 kg  He presents the clinic today for follow-up evaluation and states***  *** denies chest pain, shortness of breath, lower extremity edema, fatigue, palpitations, melena, hematuria, hemoptysis, diaphoresis, weakness, presyncope, syncope, orthopnea, and PND.   Home Medications    Prior to Admission medications   Medication Sig  Start Date End Date Taking? Authorizing Provider  amLODipine (NORVASC) 10 MG tablet Take 10 mg by mouth daily.    [provider]  aspirin 81 MG tablet Take 81 mg by mouth daily.    [provider]  calcitRIOL (ROCALTROL) 0.25 MCG capsule Take 0.25 mcg by mouth 2 (two) times a week. 10/18/19   [provider]  calcium carbonate (TUMS - DOSED IN MG ELEMENTAL CALCIUM) 500 MG chewable tablet Chew 1 tablet by mouth daily.    [provider]  cefdinir (OMNICEF) 300 MG capsule Take 1 capsule (300 mg total) by mouth every 12 (twelve) hours. 11/06/19   Nita Sells, MD  ferrous sulfate 325 (65 FE) MG tablet Take 325 mg by mouth daily with breakfast.    [provider]  gabapentin (NEURONTIN) 300 MG capsule Take 300 mg by mouth 3 (three) times daily. 10/18/19   [provider]  hydrALAZINE (APRESOLINE) 50 MG tablet Take 1 tablet (50 mg total) by mouth every 8 (eight) hours. Patient not taking: Reported on 10/28/2019 06/04/18   Dessa Phi, DO  meclizine (ANTIVERT) 25 MG tablet Take 25 mg by mouth 3 (three) times daily as needed for dizziness or nausea.  10/18/19   [provider]  metoprolol succinate (TOPROL-XL) 25 MG 24 hr tablet Take 25 mg by mouth daily. 10/18/19   [provider]  senna-docusate (SENOKOT-S) 8.6-50 MG tablet Take 1 tablet by mouth 2 (two) times daily.    [provider]  torsemide (DEMADEX) 20 MG tablet Take 1 tablet (20 mg total) by mouth every other day. 11/06/19  Nita Sells, MD    Family History    Family History  Problem Relation Age of Onset  . Diabetes Mellitus II Mother   . Hypertension Father    He indicated that his mother is deceased. He indicated that his father is alive.  Social History    Social History   Socioeconomic History  . Marital status: Married    Spouse name: Not on file  . Number of children: Not on file  . Years of education: Not on file  . Highest  education level: Not on file  Occupational History  . Not on file  Tobacco Use  . Smoking status: Never Smoker  . Smokeless tobacco: Never Used  Vaping Use  . Vaping Use: Never used  Substance and Sexual Activity  . Alcohol use: No    Alcohol/week: 0.0 standard drinks  . Drug use: No  . Sexual activity: Not on file  Other Topics Concern  . Not on file  Social History Narrative  . Not on file   Social Determinants of Health   Financial Resource Strain:   . Difficulty of Paying Living Expenses:   Food Insecurity:   . Worried About Charity fundraiser in the Last Year:   . Arboriculturist in the Last Year:   Transportation Needs:   . Film/video editor (Medical):   Marland Kitchen Lack of Transportation (Non-Medical):   Physical Activity:   . Days of Exercise per Week:   . Minutes of Exercise per Session:   Stress:   . Feeling of Stress :   Social Connections:   . Frequency of Communication with Friends and Family:   . Frequency of Social Gatherings with Friends and Family:   . Attends Religious Services:   . Active Member of Clubs or Organizations:   . Attends Archivist Meetings:   Marland Kitchen Marital Status:   Intimate Partner Violence:   . Fear of Current or Ex-Partner:   . Emotionally Abused:   Marland Kitchen Physically Abused:   . Sexually Abused:      Review of Systems    General:  No chills, fever, night sweats or weight changes.  Cardiovascular:  No chest pain, dyspnea on exertion, edema, orthopnea, palpitations, paroxysmal nocturnal dyspnea. Dermatological: No rash, lesions/masses Respiratory: No cough, dyspnea Urologic: No hematuria, dysuria Abdominal:   No nausea, vomiting, diarrhea, bright red blood per rectum, melena, or hematemesis Neurologic:  No visual changes, wkns, changes in mental status. All other systems reviewed and are otherwise negative except as noted above.  Physical Exam    VS:  There were no vitals taken for this visit. , BMI There is no height or  weight on file to calculate BMI. GEN: Well nourished, well developed, in no acute distress. HEENT: normal. Neck: Supple, no JVD, carotid bruits, or masses. Cardiac: RRR, no murmurs, rubs, or gallops. No clubbing, cyanosis, edema.  Radials/DP/PT 2+ and equal bilaterally.  Respiratory:  Respirations regular and unlabored, clear to auscultation bilaterally. GI: Soft, nontender, nondistended, BS + x 4. MS: no deformity or atrophy. Skin: warm and dry, no rash. Neuro:  Strength and sensation are intact. Psych: Normal affect.  Accessory Clinical Findings    Recent Labs: 10/28/2019: B Natriuretic Peptide 745.1 10/29/2019: ALT 18 11/05/2019: Hemoglobin 10.1; Platelets 267 11/06/2019: BUN 51; Creatinine, Ser 2.94; Potassium 4.8; Sodium 140   Recent Lipid Panel No results found for: CHOL, TRIG, HDL, CHOLHDL, VLDL, LDLCALC, LDLDIRECT  ECG personally reviewed by me today- *** -  No acute changes  Nuclear stress test 07/07/2018  The left ventricular ejection fraction is normal (55-65%).  Nuclear stress EF: 57%. No wall motion abnormalities  There was no ST segment deviation noted during stress.  Defect 1: There is a medium defect of mild severity present in the basal inferior location. This appears secondary to diaphragmatic attenuation artifact  This is a low risk study. No significant evidence of ischemia identified. Echocardiogram 10/29/2019 IMPRESSIONS    1. Left ventricular ejection fraction, by estimation, is 55 to 60%. The  left ventricle has normal function. The left ventricle has no regional  wall motion abnormalities. There is moderate left ventricular hypertrophy.  Left ventricular diastolic  parameters are indeterminate.  2. Right ventricular systolic function is normal. The right ventricular  size is normal. Tricuspid regurgitation signal is inadequate for assessing  PA pressure.  3. The mitral valve is grossly normal. Mild mitral valve regurgitation.  4. The aortic valve is  tricuspid, mildly calcified. Aortic valve  regurgitation is not visualized.  5. The inferior vena cava is normal in size with greater than 50%  respiratory variability, suggesting right atrial pressure of 3 mmHg.  6. Trivial to small circumferential pericardial effusion.   Assessment & Plan   1.  Acute on chronic diastolic heart failure-euvolemic today.  No increased activity intolerance/DOE.  Weight at discharge 87 kg diuresed 2.3 L.  Weight today***echocardiogram showed LVEF of 55-60% moderate LVH, intermediate diastolic parameters. Continue torsemide 20 mg daily, metoprolol, hydralazine Heart healthy low-sodium diet-salty 6 given Increase physical activity as tolerated Daily weights-contact office with weight gain of 3 pounds overnight or 5 pounds in a week.  Hypertensive cardiomyopathy-BP today***.  Well-controlled at home.  Echocardiogram results shown above. Continue amlodipine, hydralazine, metoprolol Heart healthy low-sodium diet-salty 6 given Increase physical activity as tolerated  Chronic kidney disease stage IV-baseline creatinine 2.9 Followed by nephrology  Diabetes mellitus type 2-insulin-dependent diabetes mellitus with chronic renal insufficiency Followed by PCP  Disposition: Follow-up with Dr. Stanford Breed in 3 months.  Jossie Ng. Stefannie Defeo NP-C    12/20/2019, 8:26 AM [facility] [place] [address] Office [phone] Fax [phone]  Notice: This dictation was prepared with Dragon dictation along with smaller phrase technology. Any transcriptional errors that result from this process are unintentional and may not be corrected upon review.

## 2019-12-23 ENCOUNTER — Other Ambulatory Visit: Payer: Self-pay

## 2019-12-23 ENCOUNTER — Ambulatory Visit: Payer: Medicare Other | Admitting: General Practice

## 2019-12-23 ENCOUNTER — Emergency Department (HOSPITAL_COMMUNITY): Payer: Medicare Other

## 2019-12-23 ENCOUNTER — Inpatient Hospital Stay (HOSPITAL_COMMUNITY): Admission: RE | Admit: 2019-12-23 | Payer: Medicare Other | Source: Ambulatory Visit

## 2019-12-23 ENCOUNTER — Inpatient Hospital Stay (HOSPITAL_COMMUNITY)
Admission: EM | Admit: 2019-12-23 | Discharge: 2019-12-31 | DRG: 291 | Disposition: A | Discharge status: Home / Self Care | Payer: Medicare Other | Attending: Internal Medicine | Admitting: Internal Medicine

## 2019-12-23 DIAGNOSIS — J9621 Acute and chronic respiratory failure with hypoxia: Secondary | ICD-10-CM

## 2019-12-23 DIAGNOSIS — Z20822 Contact with and (suspected) exposure to covid-19: Secondary | ICD-10-CM | POA: Diagnosis present

## 2019-12-23 DIAGNOSIS — R42 Dizziness and giddiness: Secondary | ICD-10-CM

## 2019-12-23 DIAGNOSIS — M898X9 Other specified disorders of bone, unspecified site: Secondary | ICD-10-CM | POA: Diagnosis present

## 2019-12-23 DIAGNOSIS — N179 Acute kidney failure, unspecified: Secondary | ICD-10-CM | POA: Diagnosis present

## 2019-12-23 DIAGNOSIS — D638 Anemia in other chronic diseases classified elsewhere: Secondary | ICD-10-CM | POA: Diagnosis not present

## 2019-12-23 DIAGNOSIS — Z8249 Family history of ischemic heart disease and other diseases of the circulatory system: Secondary | ICD-10-CM | POA: Diagnosis not present

## 2019-12-23 DIAGNOSIS — T502X5A Adverse effect of carbonic-anhydrase inhibitors, benzothiadiazides and other diuretics, initial encounter: Secondary | ICD-10-CM | POA: Diagnosis present

## 2019-12-23 DIAGNOSIS — D631 Anemia in chronic kidney disease: Secondary | ICD-10-CM | POA: Diagnosis present

## 2019-12-23 DIAGNOSIS — I5033 Acute on chronic diastolic (congestive) heart failure: Secondary | ICD-10-CM | POA: Diagnosis present

## 2019-12-23 DIAGNOSIS — J918 Pleural effusion in other conditions classified elsewhere: Secondary | ICD-10-CM | POA: Diagnosis present

## 2019-12-23 DIAGNOSIS — T501X5A Adverse effect of loop [high-ceiling] diuretics, initial encounter: Secondary | ICD-10-CM | POA: Diagnosis not present

## 2019-12-23 DIAGNOSIS — E1122 Type 2 diabetes mellitus with diabetic chronic kidney disease: Secondary | ICD-10-CM | POA: Diagnosis present

## 2019-12-23 DIAGNOSIS — Z79899 Other long term (current) drug therapy: Secondary | ICD-10-CM

## 2019-12-23 DIAGNOSIS — N184 Chronic kidney disease, stage 4 (severe): Secondary | ICD-10-CM | POA: Diagnosis present

## 2019-12-23 DIAGNOSIS — Z833 Family history of diabetes mellitus: Secondary | ICD-10-CM | POA: Diagnosis not present

## 2019-12-23 DIAGNOSIS — E11649 Type 2 diabetes mellitus with hypoglycemia without coma: Secondary | ICD-10-CM | POA: Diagnosis present

## 2019-12-23 DIAGNOSIS — J90 Pleural effusion, not elsewhere classified: Secondary | ICD-10-CM

## 2019-12-23 DIAGNOSIS — J9601 Acute respiratory failure with hypoxia: Secondary | ICD-10-CM | POA: Diagnosis not present

## 2019-12-23 DIAGNOSIS — N185 Chronic kidney disease, stage 5: Secondary | ICD-10-CM | POA: Diagnosis present

## 2019-12-23 DIAGNOSIS — I10 Essential (primary) hypertension: Secondary | ICD-10-CM | POA: Diagnosis not present

## 2019-12-23 DIAGNOSIS — R0602 Shortness of breath: Secondary | ICD-10-CM

## 2019-12-23 DIAGNOSIS — R0782 Intercostal pain: Secondary | ICD-10-CM | POA: Diagnosis not present

## 2019-12-23 DIAGNOSIS — E785 Hyperlipidemia, unspecified: Secondary | ICD-10-CM | POA: Diagnosis present

## 2019-12-23 DIAGNOSIS — I130 Hypertensive heart and chronic kidney disease with heart failure and stage 1 through stage 4 chronic kidney disease, or unspecified chronic kidney disease: Principal | ICD-10-CM | POA: Diagnosis present

## 2019-12-23 DIAGNOSIS — N183 Chronic kidney disease, stage 3 unspecified: Secondary | ICD-10-CM

## 2019-12-23 DIAGNOSIS — I509 Heart failure, unspecified: Secondary | ICD-10-CM

## 2019-12-23 DIAGNOSIS — Z794 Long term (current) use of insulin: Secondary | ICD-10-CM

## 2019-12-23 LAB — CBC WITH DIFFERENTIAL/PLATELET
Abs Immature Granulocytes: 0.06 10*3/uL (ref 0.00–0.07)
Basophils Absolute: 0.1 10*3/uL (ref 0.0–0.1)
Basophils Relative: 1 %
Eosinophils Absolute: 0.3 10*3/uL (ref 0.0–0.5)
Eosinophils Relative: 3 %
HCT: 37.5 % — ABNORMAL LOW (ref 39.0–52.0)
Hemoglobin: 11.2 g/dL — ABNORMAL LOW (ref 13.0–17.0)
Immature Granulocytes: 1 %
Lymphocytes Relative: 21 %
Lymphs Abs: 1.7 10*3/uL (ref 0.7–4.0)
MCH: 27.1 pg (ref 26.0–34.0)
MCHC: 29.9 g/dL — ABNORMAL LOW (ref 30.0–36.0)
MCV: 90.6 fL (ref 80.0–100.0)
Monocytes Absolute: 0.8 10*3/uL (ref 0.1–1.0)
Monocytes Relative: 9 %
Neutro Abs: 5.4 10*3/uL (ref 1.7–7.7)
Neutrophils Relative %: 65 %
Platelets: 277 10*3/uL (ref 150–400)
RBC: 4.14 MIL/uL — ABNORMAL LOW (ref 4.22–5.81)
RDW: 17.4 % — ABNORMAL HIGH (ref 11.5–15.5)
WBC: 8.3 10*3/uL (ref 4.0–10.5)
nRBC: 0.8 % — ABNORMAL HIGH (ref 0.0–0.2)

## 2019-12-23 LAB — COMPREHENSIVE METABOLIC PANEL
ALT: 24 U/L (ref 0–44)
AST: 21 U/L (ref 15–41)
Albumin: 3.5 g/dL (ref 3.5–5.0)
Alkaline Phosphatase: 98 U/L (ref 38–126)
Anion gap: 9 (ref 5–15)
BUN: 42 mg/dL — ABNORMAL HIGH (ref 8–23)
CO2: 21 mmol/L — ABNORMAL LOW (ref 22–32)
Calcium: 7.6 mg/dL — ABNORMAL LOW (ref 8.9–10.3)
Chloride: 110 mmol/L (ref 98–111)
Creatinine, Ser: 2.96 mg/dL — ABNORMAL HIGH (ref 0.61–1.24)
GFR calc Af Amer: 24 mL/min — ABNORMAL LOW (ref >60)
GFR calc non Af Amer: 20 mL/min — ABNORMAL LOW (ref >60)
Glucose, Bld: 147 mg/dL — ABNORMAL HIGH (ref 70–99)
Potassium: 4.5 mmol/L (ref 3.5–5.1)
Sodium: 140 mmol/L (ref 135–145)
Total Bilirubin: 0.9 mg/dL (ref 0.3–1.2)
Total Protein: 7.5 g/dL (ref 6.5–8.1)

## 2019-12-23 LAB — GLUCOSE, CAPILLARY: Glucose-Capillary: 140 mg/dL — ABNORMAL HIGH (ref 70–99)

## 2019-12-23 LAB — BRAIN NATRIURETIC PEPTIDE: B Natriuretic Peptide: 832.6 pg/mL — ABNORMAL HIGH (ref 0.0–100.0)

## 2019-12-23 LAB — SARS CORONAVIRUS 2 BY RT PCR (HOSPITAL ORDER, PERFORMED IN ~~LOC~~ HOSPITAL LAB): SARS Coronavirus 2: NEGATIVE

## 2019-12-23 LAB — TROPONIN I (HIGH SENSITIVITY)
Troponin I (High Sensitivity): 10 ng/L (ref <18)
Troponin I (High Sensitivity): 11 ng/L (ref <18)

## 2019-12-23 MED ORDER — SODIUM CHLORIDE 0.9% FLUSH
3.0000 mL | Freq: Two times a day (BID) | INTRAVENOUS | Status: DC
  Administered 2019-12-23 – 2019-12-31 (×15): 3 mL via INTRAVENOUS

## 2019-12-23 MED ORDER — CALCITRIOL 0.25 MCG PO CAPS
0.2500 ug | ORAL_CAPSULE | ORAL | Status: DC
  Administered 2019-12-26 – 2019-12-30 (×3): 0.25 ug via ORAL
  Filled 2019-12-23 (×3): qty 1

## 2019-12-23 MED ORDER — ASPIRIN EC 81 MG PO TBEC
81.0000 mg | DELAYED_RELEASE_TABLET | Freq: Every day | ORAL | Status: DC
  Administered 2019-12-24 – 2019-12-31 (×8): 81 mg via ORAL
  Filled 2019-12-23 (×9): qty 1

## 2019-12-23 MED ORDER — SODIUM CHLORIDE 0.9 % IV SOLN: 250.0000 mL | INTRAVENOUS | Status: DC | PRN

## 2019-12-23 MED ORDER — FUROSEMIDE 10 MG/ML IJ SOLN
80.0000 mg | Freq: Once | INTRAMUSCULAR | Status: AC
  Administered 2019-12-23: 80 mg via INTRAVENOUS
  Filled 2019-12-23: qty 8

## 2019-12-23 MED ORDER — FERROUS SULFATE 325 (65 FE) MG PO TABS
325.0000 mg | ORAL_TABLET | Freq: Every day | ORAL | Status: DC
  Administered 2019-12-24 – 2019-12-31 (×8): 325 mg via ORAL
  Filled 2019-12-23 (×8): qty 1

## 2019-12-23 MED ORDER — FUROSEMIDE 10 MG/ML IJ SOLN
40.0000 mg | Freq: Two times a day (BID) | INTRAMUSCULAR | Status: DC
  Administered 2019-12-23: 40 mg via INTRAVENOUS
  Filled 2019-12-23: qty 4

## 2019-12-23 MED ORDER — AMLODIPINE BESYLATE 10 MG PO TABS
10.0000 mg | ORAL_TABLET | Freq: Every day | ORAL | Status: DC
  Administered 2019-12-24 – 2019-12-31 (×8): 10 mg via ORAL
  Filled 2019-12-23 (×8): qty 1

## 2019-12-23 MED ORDER — GABAPENTIN 300 MG PO CAPS
300.0000 mg | ORAL_CAPSULE | Freq: Three times a day (TID) | ORAL | Status: DC
  Administered 2019-12-23 – 2019-12-24 (×2): 300 mg via ORAL
  Filled 2019-12-23 (×2): qty 1

## 2019-12-23 MED ORDER — ACETAMINOPHEN 325 MG PO TABS
650.0000 mg | ORAL_TABLET | ORAL | Status: DC | PRN
  Administered 2019-12-24 – 2019-12-29 (×3): 650 mg via ORAL
  Filled 2019-12-23 (×3): qty 2

## 2019-12-23 MED ORDER — ISOSORBIDE MONONITRATE ER 30 MG PO TB24
30.0000 mg | ORAL_TABLET | Freq: Every evening | ORAL | Status: DC
  Administered 2019-12-23 – 2019-12-24 (×2): 30 mg via ORAL
  Filled 2019-12-23 (×3): qty 1

## 2019-12-23 MED ORDER — CALCIUM CARBONATE ANTACID 500 MG PO CHEW
2.0000 | CHEWABLE_TABLET | Freq: Every day | ORAL | Status: DC
  Administered 2019-12-23 – 2019-12-31 (×9): 400 mg via ORAL
  Filled 2019-12-23 (×9): qty 2

## 2019-12-23 MED ORDER — ONDANSETRON HCL 4 MG/2ML IJ SOLN
4.0000 mg | Freq: Four times a day (QID) | INTRAMUSCULAR | Status: DC | PRN
  Filled 2019-12-23: qty 2

## 2019-12-23 MED ORDER — METOLAZONE 2.5 MG PO TABS
2.5000 mg | ORAL_TABLET | Freq: Every day | ORAL | Status: DC
  Administered 2019-12-23 – 2019-12-24 (×2): 2.5 mg via ORAL
  Filled 2019-12-23 (×3): qty 1

## 2019-12-23 MED ORDER — SENNOSIDES-DOCUSATE SODIUM 8.6-50 MG PO TABS
1.0000 | ORAL_TABLET | Freq: Two times a day (BID) | ORAL | Status: DC
  Administered 2019-12-23: 1 via ORAL
  Filled 2019-12-23 (×4): qty 1

## 2019-12-23 MED ORDER — FUROSEMIDE 10 MG/ML IJ SOLN
80.0000 mg | Freq: Two times a day (BID) | INTRAMUSCULAR | Status: DC
  Administered 2019-12-23 – 2019-12-26 (×7): 80 mg via INTRAVENOUS
  Filled 2019-12-23 (×7): qty 8

## 2019-12-23 MED ORDER — HYDRALAZINE HCL 50 MG PO TABS
50.0000 mg | ORAL_TABLET | Freq: Three times a day (TID) | ORAL | Status: DC
  Administered 2019-12-23 – 2019-12-31 (×23): 50 mg via ORAL
  Filled 2019-12-23 (×23): qty 1

## 2019-12-23 MED ORDER — SODIUM CHLORIDE 0.9% FLUSH
3.0000 mL | INTRAVENOUS | Status: DC | PRN
  Administered 2019-12-26: 3 mL via INTRAVENOUS

## 2019-12-23 MED ORDER — MECLIZINE HCL 25 MG PO TABS
25.0000 mg | ORAL_TABLET | Freq: Three times a day (TID) | ORAL | Status: DC
  Administered 2019-12-23 – 2019-12-31 (×23): 25 mg via ORAL
  Filled 2019-12-23 (×27): qty 1

## 2019-12-23 MED ORDER — METOPROLOL TARTRATE 25 MG PO TABS
25.0000 mg | ORAL_TABLET | Freq: Two times a day (BID) | ORAL | Status: DC
  Administered 2019-12-23 – 2019-12-24 (×3): 25 mg via ORAL
  Filled 2019-12-23 (×4): qty 1

## 2019-12-23 MED ORDER — HEPARIN SODIUM (PORCINE) 5000 UNIT/ML IJ SOLN
5000.0000 [IU] | Freq: Three times a day (TID) | INTRAMUSCULAR | Status: DC
  Administered 2019-12-23 – 2019-12-31 (×23): 5000 [IU] via SUBCUTANEOUS
  Filled 2019-12-23 (×23): qty 1

## 2019-12-23 NOTE — Consult Note (Signed)
Cardiology Consultation:   Patient ID: Melvin Mcclure MRN: [id number]; DOB: 01-08-1951  Admit date: 12/23/2019 Date of Consult: 12/23/2019  Primary Care Provider: Nolene Ebbs, MD [facility] Cardiologist: Kirk Ruths, MD   Patient Profile:   Melvin Mcclure is a 69 y.o. male with a hx of chronic diastolic CHF, hypertension who is being seen today for the evaluation of acute on chronic diastolic CHF at the request of Dr.  Fuller Plan.  History of Present Illness:   Melvin Mcclure 69 year old male with prior medical history of hypertension, type 2 diabetes non-insulin-dependent, CKD stage 4, Crea 2.2- 2.8, GFR 24, followed by [facility], previously hospitalized in January 2021 with acute on chronic diastolic CHF, followed in our clinic in February 2021 when he was doing well with baseline weight of 197 -203 lbs.  At the time echocardiogram showed LVEF 65-70 % with severe concentric LVH, grade 2 diastolic dysfunction, mild biatrial dilatation, mild mitral and tricuspid regurgitation, there was a plan for outpatient nuclear stress test as well as PYP scan however this did not happen and is not documented why. He was readmitted from June 25 - July 4 for community-acquired pneumonia and acute on chronic diastolic CHF, diuresed and treated with antibiotics and sent home on torsemide 20 mg daily.  The patient was readmitted on December 23, 2019 with progressively worsening shortness of breath first on exertion with inability to walk 1 flight of stairs, it progressed to shortness of breath at rest orthopnea and proximal nocturnal dyspnea.  He has minimal lower extremity edema, he states he has been compliant with his medication regimen and low-sodium diet at home.  Denies any chest pain palpitations or syncope.  No fevers.  Past Medical History:  Diagnosis Date  . CHF (congestive heart failure) ([place]) 05/28/2018   dx 05/28/18  . Diabetes mellitus without complication ([place])   .  Hyperlipidemia   . Hypertension   . Perforating neurotrophic ulcer of foot ([place])    Past Surgical History:  Procedure Laterality Date  . ACHILLES TENDON REPAIR    . CYST EXCISION      Inpatient Medications: Scheduled Meds: . [START ON 12/24/2019] amLODipine  10 mg Oral Daily  . aspirin EC  81 mg Oral Daily  . calcitRIOL  0.25 mcg Oral Once per day on Mon Wed Fri  . calcium carbonate  2 tablet Oral Daily  . [START ON 12/24/2019] ferrous sulfate  325 mg Oral Q breakfast  . furosemide  40 mg Intravenous BID  . gabapentin  300 mg Oral TID  . heparin  5,000 Units Subcutaneous Q8H  . hydrALAZINE  50 mg Oral TID  . isosorbide mononitrate  30 mg Oral QPM  . meclizine  25 mg Oral TID  . metoprolol tartrate  25 mg Oral BID  . senna-docusate  1 tablet Oral BID  . sodium chloride flush  3 mL Intravenous Q12H   Continuous Infusions: . sodium chloride     PRN Meds: sodium chloride, acetaminophen, ondansetron (ZOFRAN) IV, sodium chloride flush  Allergies:   No Known Allergies  Social History:   Social History   Socioeconomic History  . Marital status: Married    Spouse name: Not on file  . Number of children: Not on file  . Years of education: Not on file  . Highest education level: Not on file  Occupational History  . Not on file  Tobacco Use  . Smoking status: Never Smoker  . Smokeless tobacco: Never Used  Vaping Use  .  Vaping Use: Never used  Substance and Sexual Activity  . Alcohol use: No    Alcohol/week: 0.0 standard drinks  . Drug use: No  . Sexual activity: Not on file  Other Topics Concern  . Not on file  Social History Narrative  . Not on file   Social Determinants of Health   Financial Resource Strain:   . Difficulty of Paying Living Expenses: Not on file  Food Insecurity:   . Worried About Charity fundraiser in the Last Year: Not on file  . Ran Out of Food in the Last Year: Not on file  Transportation Needs:   . Lack of Transportation (Medical): Not on  file  . Lack of Transportation (Non-Medical): Not on file  Physical Activity:   . Days of Exercise per Week: Not on file  . Minutes of Exercise per Session: Not on file  Stress:   . Feeling of Stress : Not on file  Social Connections:   . Frequency of Communication with Friends and Family: Not on file  . Frequency of Social Gatherings with Friends and Family: Not on file  . Attends Religious Services: Not on file  . Active Member of Clubs or Organizations: Not on file  . Attends Archivist Meetings: Not on file  . Marital Status: Not on file  Intimate Partner Violence:   . Fear of Current or Ex-Partner: Not on file  . Emotionally Abused: Not on file  . Physically Abused: Not on file  . Sexually Abused: Not on file    Family History:    Family History  Problem Relation Age of Onset  . Diabetes Mellitus II Mother   . Hypertension Father     ROS:  Please see the history of present illness.  All other ROS reviewed and negative.     Physical Exam/Data:   Vitals:   12/23/19 1501 12/23/19 1511 12/23/19 1512 12/23/19 1813  BP:   (!) 154/77 134/64  Pulse:   72 65  Resp:   18 17  Temp:   98.7 F (37.1 C) 97.7 F (36.5 C)  TempSrc:   Oral Oral  SpO2:   96% 98%  Weight:  95.5 kg    Height: 5\' 10"  (1.778 m)      No intake or output data in the 24 hours ending 12/23/19 1840 Last 3 Weights 12/23/2019 12/23/2019 11/06/2019  Weight (lbs) 210 lb 8.6 oz 200 lb 192 lb 6.4 oz  Weight (kg) 95.5 kg 90.719 kg 87.272 kg     Body mass index is 30.21 kg/m.  General:  Well nourished, well developed, in mild respiratory distress on 2 L of oxygen via nasal cannula HEENT: normal Lymph: no adenopathy Neck: +8 cm JVD bilaterally Endocrine:  No thryomegaly Vascular: No carotid bruits; FA pulses 2+ bilaterally without bruits  Cardiac:  normal S1, S2; RRR; no murmur  Lungs: Crackles to auscultation bilaterally, no wheezing, rhonchi or rales  Abd: soft, nontender, no hepatomegaly  Ext:  Trivial edema Musculoskeletal:  No deformities, BUE and BLE strength normal and equal Skin: warm and dry  Neuro:  CNs 2-12 intact, no focal abnormalities noted Psych:  Normal affect   EKG:  The EKG was personally reviewed and demonstrates:  SR, RBBB + LAFB, unchanged from prior Telemetry:  Telemetry was personally reviewed and demonstrates: Sinus rhythm  Relevant CV Studies:  10/29/2019  1. Left ventricular ejection fraction, by estimation, is 55 to 60%. The  left ventricle has normal function. The  left ventricle has no regional  wall motion abnormalities. There is moderate left ventricular hypertrophy.  Left ventricular diastolic  parameters are indeterminate.  2. Right ventricular systolic function is normal. The right ventricular  size is normal. Tricuspid regurgitation signal is inadequate for assessing  PA pressure.  3. The mitral valve is grossly normal. Mild mitral valve regurgitation.  4. The aortic valve is tricuspid, mildly calcified. Aortic valve  regurgitation is not visualized.  5. The inferior vena cava is normal in size with greater than 50%  respiratory variability, suggesting right atrial pressure of 3 mmHg.  6. Trivial to small circumferential pericardial effusion  Laboratory Data:  High Sensitivity Troponin:   Recent Labs  Lab 12/23/19 0636 12/23/19 1059  TROPONINIHS 10 11     Chemistry Recent Labs  Lab 12/23/19 0636  NA 140  K 4.5  CL 110  CO2 21*  GLUCOSE 147*  BUN 42*  CREATININE 2.96*  CALCIUM 7.6*  GFRNONAA 20*  GFRAA 24*  ANIONGAP 9    Recent Labs  Lab 12/23/19 0636  PROT 7.5  ALBUMIN 3.5  AST 21  ALT 24  ALKPHOS 98  BILITOT 0.9   Hematology Recent Labs  Lab 12/23/19 0636  WBC 8.3  RBC 4.14*  HGB 11.2*  HCT 37.5*  MCV 90.6  MCH 27.1  MCHC 29.9*  RDW 17.4*  PLT 277   BNP Recent Labs  Lab 12/23/19 0636  BNP 832.6*    DDimer No results for input(s): DDIMER in the last 168 hours.  Radiology/Studies:  DG  Chest 2 View  Result Date: 12/23/2019 CLINICAL DATA:  Shortness of breath. EXAM: CHEST - 2 VIEW COMPARISON:  11/05/2019. FINDINGS: Cardiomegaly. Mild pulmonary venous congestion. Bilateral interstitial prominence suggesting CHF. Prominent right base atelectasis/infiltrate and moderate right pleural effusion. No pneumothorax. Degenerative change thoracic spine. IMPRESSION: 1. Cardiomegaly. Mild pulmonary venous congestion. Bilateral interstitial prominence suggesting CHF. 2. Prominent right base atelectasis/infiltrate and moderate right pleural effusion. Electronically Signed   By: Marcello Moores  Register   On: 12/23/2019 07:08   New York Heart Association (NYHA) Functional Class NYHA Class IV  Assessment and Plan:   1. Acute on chronic diastolic CHF, BNP 379  -I would increase Lasix to 80 mg IV twice daily with additional dose now  -Add metolazone 2.5 mg daily  -He will need outpatient work-up for possible cardiac amyloidosis with PYP scan, 2.  Hypertension -continue current meds metoprolol and Imdur, amlodipine was added 3.  Ischemic work-up should be considered.  For questions or updates, please contact [place] Please consult [email or web address] for contact info under   Signed, Ena Dawley, MD  12/23/2019 6:40 PM

## 2019-12-23 NOTE — ED Provider Notes (Signed)
[facility] EMERGENCY DEPARTMENT Provider Note   CSN: [id number] Arrival date & time: 12/23/19  6433     History Chief Complaint  Patient presents with  . Shortness of Breath    Elber Galyean is a 69 y.o. male.  Patient is a 69 year old male who presents with shortness of breath. He has a history of CHF, diabetes, hypertension, hyperlipidemia and chronic kidney disease. He is not on home oxygen. He was recently admitted from June 25 to July 4 for combination of CHF exacerbation with community-acquired pneumonia. He says over the last week he is progressively gotten more short of breath where he can only walk very short distances without getting completely out of breath. He has a cough which is mostly dry. He does not report any significantly increased leg swelling. No fevers. No nausea or vomiting. He was noted to have an oxygen saturation of 84% on room air in triage today and was placed on nasal cannula at 3 L. His oxygen saturation is now in the mid 90s.        Past Medical History:  Diagnosis Date  . CHF (congestive heart failure) ([place]) 05/28/2018   dx 05/28/18  . Diabetes mellitus without complication ([place])   . Hyperlipidemia   . Hypertension   . Perforating neurotrophic ulcer of foot [facility])     Patient Active Problem List   Diagnosis Date Noted  . CAP (community acquired pneumonia) 10/28/2019  . Hypertensive cardiomyopathy, with heart failure ([place]) 06/15/2018  . Acute diastolic CHF (congestive heart failure) ([place]) 05/28/2018  . Type 2 diabetes mellitus with stage 3 chronic kidney disease ([place]) 05/28/2018  . CKD stage 4 due to type 2 diabetes mellitus ([place]) 05/28/2018  . CHF (congestive heart failure) ([place]) 05/28/2018    Past Surgical History:  Procedure Laterality Date  . ACHILLES TENDON REPAIR    . CYST EXCISION         Family History  Problem Relation Age of Onset  . Diabetes Mellitus II Mother   . Hypertension Father     Social  History   Tobacco Use  . Smoking status: Never Smoker  . Smokeless tobacco: Never Used  Vaping Use  . Vaping Use: Never used  Substance Use Topics  . Alcohol use: No    Alcohol/week: 0.0 standard drinks  . Drug use: No    Home Medications Prior to Admission medications   Medication Sig Start Date End Date Taking? Authorizing Provider  amLODipine (NORVASC) 10 MG tablet Take 10 mg by mouth daily.    [provider]  aspirin 81 MG tablet Take 81 mg by mouth daily.    [provider]  calcitRIOL (ROCALTROL) 0.25 MCG capsule Take 0.25 mcg by mouth 2 (two) times a week. 10/18/19   [provider]  calcium carbonate (TUMS - DOSED IN MG ELEMENTAL CALCIUM) 500 MG chewable tablet Chew 1 tablet by mouth daily.    [provider]  cefdinir (OMNICEF) 300 MG capsule Take 1 capsule (300 mg total) by mouth every 12 (twelve) hours. 11/06/19   Nita Sells, MD  ferrous sulfate 325 (65 FE) MG tablet Take 325 mg by mouth daily with breakfast.    [provider]  gabapentin (NEURONTIN) 300 MG capsule Take 300 mg by mouth 3 (three) times daily. 10/18/19   [provider]  hydrALAZINE (APRESOLINE) 50 MG tablet Take 1 tablet (50 mg total) by mouth every 8 (eight) hours. Patient not taking: Reported on 10/28/2019 06/04/18  Dessa Phi, DO  meclizine (ANTIVERT) 25 MG tablet Take 25 mg by mouth 3 (three) times daily as needed for dizziness or nausea.  10/18/19   [provider]  metoprolol succinate (TOPROL-XL) 25 MG 24 hr tablet Take 25 mg by mouth daily. 10/18/19   [provider]  senna-docusate (SENOKOT-S) 8.6-50 MG tablet Take 1 tablet by mouth 2 (two) times daily.    [provider]  torsemide (DEMADEX) 20 MG tablet Take 1 tablet (20 mg total) by mouth every other day. 11/06/19   Nita Sells, MD    Allergies    Patient has no known allergies.  Review of Systems   Review of Systems  Constitutional: Positive for  fatigue. Negative for chills, diaphoresis and fever.  HENT: Negative for congestion, rhinorrhea and sneezing.   Eyes: Negative.   Respiratory: Positive for cough and shortness of breath. Negative for chest tightness.   Cardiovascular: Negative for chest pain and leg swelling.  Gastrointestinal: Negative for abdominal pain, blood in stool, diarrhea, nausea and vomiting.  Genitourinary: Negative for difficulty urinating, flank pain, frequency and hematuria.  Musculoskeletal: Negative for arthralgias and back pain.  Skin: Negative for rash.  Neurological: Negative for dizziness, speech difficulty, weakness, numbness and headaches.    Physical Exam Updated Vital Signs BP (!) 142/75 (BP Location: Right Arm)   Pulse (!) 57   Temp 98.4 F (36.9 C) (Oral)   Resp 17   Ht 5\' 11"  (1.803 m)   Wt 90.7 kg   SpO2 97%   BMI 27.89 kg/m   Physical Exam Constitutional:      Appearance: He is well-developed.  HENT:     Head: Normocephalic and atraumatic.  Eyes:     Pupils: Pupils are equal, round, and reactive to light.  Cardiovascular:     Rate and Rhythm: Normal rate and regular rhythm.     Heart sounds: Normal heart sounds.  Pulmonary:     Effort: Pulmonary effort is normal. No respiratory distress.     Breath sounds: Rales (Few crackles in the bases bilaterally) present. No wheezing.  Chest:     Chest wall: No tenderness.  Abdominal:     General: Bowel sounds are normal.     Palpations: Abdomen is soft.     Tenderness: There is no abdominal tenderness. There is no guarding or rebound.  Musculoskeletal:        General: Normal range of motion.     Cervical back: Normal range of motion and neck supple.     Right lower leg: Edema (1+ pain edema to lower extremities bilaterally) present.  Lymphadenopathy:     Cervical: No cervical adenopathy.  Skin:    General: Skin is warm and dry.     Findings: No rash.  Neurological:     Mental Status: He is alert and oriented to person, place,  and time.     ED Results / Procedures / Treatments   Labs (all labs ordered are listed, but only abnormal results are displayed) Labs Reviewed  CBC WITH DIFFERENTIAL/PLATELET - Abnormal; Notable for the following components:      Result Value   RBC 4.14 (*)    Hemoglobin 11.2 (*)    HCT 37.5 (*)    MCHC 29.9 (*)    RDW 17.4 (*)    nRBC 0.8 (*)    All other components within normal limits  BRAIN NATRIURETIC PEPTIDE - Abnormal; Notable for the following components:   B Natriuretic Peptide 832.6 (*)  All other components within normal limits  COMPREHENSIVE METABOLIC PANEL - Abnormal; Notable for the following components:   CO2 21 (*)    Glucose, Bld 147 (*)    BUN 42 (*)    Creatinine, Ser 2.96 (*)    Calcium 7.6 (*)    GFR calc non Af Amer 20 (*)    GFR calc Af Amer 24 (*)    All other components within normal limits  SARS CORONAVIRUS 2 BY RT PCR (HOSPITAL ORDER, [facility])  TROPONIN I (HIGH SENSITIVITY)  TROPONIN I (HIGH SENSITIVITY)    EKG EKG Interpretation  Date/Time:  Friday December 23 2019 06:28:32 EDT Ventricular Rate:  62 PR Interval:  166 QRS Duration: 130 QT Interval:  498 QTC Calculation: 505 R Axis:   -47 Text Interpretation: Normal sinus rhythm Right bundle branch block Left anterior fascicular block  Bifascicular block  Left ventricular hypertrophy with repolarization abnormality ( R in aVL ) Abnormal ECG since last tracing no significant change Confirmed by Malvin Johns [phone]) on 12/23/2019 10:26:10 AM   Radiology DG Chest 2 View  Result Date: 12/23/2019 CLINICAL DATA:  Shortness of breath. EXAM: CHEST - 2 VIEW COMPARISON:  11/05/2019. FINDINGS: Cardiomegaly. Mild pulmonary venous congestion. Bilateral interstitial prominence suggesting CHF. Prominent right base atelectasis/infiltrate and moderate right pleural effusion. No pneumothorax. Degenerative change thoracic spine. IMPRESSION: 1. Cardiomegaly. Mild pulmonary venous  congestion. Bilateral interstitial prominence suggesting CHF. 2. Prominent right base atelectasis/infiltrate and moderate right pleural effusion. Electronically Signed   By: Marcello Moores  Register   On: 12/23/2019 07:08    Procedures Procedures (including critical care time)  Medications Ordered in ED Medications  furosemide (LASIX) injection 80 mg (has no administration in time range)    ED Course  I have reviewed the triage vital signs and the nursing notes.  Pertinent labs & imaging results that were available during my care of the patient were reviewed by me and considered in my medical decision making (see chart for details).    MDM Rules/Calculators/A&P                         Patient is 69 year old male who presents with worsening shortness of breath.  He has evidence of fluid overload with edema noted on x-ray and elevated BNP.  His creatinine is elevated but similar to prior values.  He is requiring oxygen and is on a nasal cannula at 3 L.  He does get short of breath with even talking.  He has no chest pain or suggestions of ACS.  No unilateral leg swelling or pleuritic pain that would be more concerning for PE.  He does not have any evidence of pneumonia on x-ray.  The x-ray was reviewed by me.  He is afebrile.  He was given a dose of IV Lasix.  I spoke with Dr. Tamala Julian with the hospitalist service who will admit the patient for further treatment. Final Clinical Impression(s) / ED Diagnoses Final diagnoses:  Acute on chronic congestive heart failure, unspecified heart failure type ([place])  Acute respiratory failure with hypoxia [facility])    Rx / DC Orders ED Discharge Orders    None       Malvin Johns, MD 12/23/19 1054

## 2019-12-23 NOTE — H&P (Addendum)
History and Physical    Mortimer Bair HBZ:[id number] DOB: 1951-03-21 DOA: 12/23/2019  Referring MD/NP/PA: Malvin Johns, MD PCP: Nolene Ebbs, MD  Patient coming from: home  Chief Complaint: Shortness of breath  I have personally briefly reviewed patient's old medical records in [place]   HPI: Alfred Harrel is a 69 y.o. male with medical history significant of diastolic HF, hypertension, diabetes mellitus type 2, CKD stage IV, and vertigo, presents with complaints of shortness of breath this morning.  Patient had just recently been hospitalized from 6/25-7/4 for CHF exacerbation with community-acquired pneumonia.  Over the last week or so he would be getting more short of breath even walking short distances.  His wife notes that he was put back on oxygen due to his symptoms.  He had reportedly been intermittently wheezing and not sleeping well due to the shortness of breath.  Denied having any significant change in his weight, coughing, or worsening leg swelling.  His wife reported that O2 saturations were as low as the 70s this morning when she checked which prompted her to bring him to the hospital, but he also had appointment for today to follow-up with cardiology.  Patient had last been seen by his nephrologist Dr. Royce Macadamia on 8/4 where he was increased to torsemide 20 mg daily, increased Tums, and increased on calcitriol.  Her records that were available is creatinine appears to have been around 2.4.  ED Course: Upon admission to the emergency department patient was seen to be afebrile, pulse 57-66, blood pressures 142/75-162/93, and O2 saturations currently maintained on 3 L of oxygen.  Labs significant for hemoglobin 11.2, BUN 42, creatinine 2.96, calcium 7.6, BNP 832.6, and high-sensitivity troponin x2.  Chest x-ray significant for signs of CHF with moderate right-sided pleural effusion.  Patient was given 80 mg of Lasix IV.  TRH called to admit  Review of Systems    Constitutional: Positive for malaise/fatigue. Negative for fever.  HENT: Negative for congestion and nosebleeds.   Eyes: Negative for photophobia and pain.  Respiratory: Positive for shortness of breath and wheezing.   Cardiovascular: Positive for orthopnea. Negative for chest pain.  Gastrointestinal: Negative for abdominal pain, nausea and vomiting.  Genitourinary: Negative for dysuria and hematuria.  Musculoskeletal: Negative for falls and myalgias.  Skin: Negative for rash.  Neurological: Negative for focal weakness and loss of consciousness.  Psychiatric/Behavioral: Negative for substance abuse. The patient has insomnia.     Past Medical History:  Diagnosis Date  . CHF (congestive heart failure) ([place]) 05/28/2018   dx 05/28/18  . Diabetes mellitus without complication ([place])   . Hyperlipidemia   . Hypertension   . Perforating neurotrophic ulcer of foot ([place])     Past Surgical History:  Procedure Laterality Date  . ACHILLES TENDON REPAIR    . CYST EXCISION       reports that he has never smoked. He has never used smokeless tobacco. He reports that he does not drink alcohol and does not use drugs.  No Known Allergies  Family History  Problem Relation Age of Onset  . Diabetes Mellitus II Mother   . Hypertension Father     Prior to Admission medications   Medication Sig Start Date End Date Taking? Authorizing Provider  amLODipine (NORVASC) 10 MG tablet Take 10 mg by mouth daily.    [provider]  aspirin 81 MG tablet Take 81 mg by mouth daily.    [provider]  calcitRIOL (ROCALTROL) 0.25 MCG capsule Take 0.25  mcg by mouth 2 (two) times a week. 10/18/19   [provider]  calcium carbonate (TUMS - DOSED IN MG ELEMENTAL CALCIUM) 500 MG chewable tablet Chew 1 tablet by mouth daily.    [provider]  cefdinir (OMNICEF) 300 MG capsule Take 1 capsule (300 mg total) by mouth every 12 (twelve) hours. 11/06/19   Nita Sells, MD   ferrous sulfate 325 (65 FE) MG tablet Take 325 mg by mouth daily with breakfast.    [provider]  gabapentin (NEURONTIN) 300 MG capsule Take 300 mg by mouth 3 (three) times daily. 10/18/19   [provider]  hydrALAZINE (APRESOLINE) 50 MG tablet Take 1 tablet (50 mg total) by mouth every 8 (eight) hours. Patient not taking: Reported on 10/28/2019 06/04/18   Dessa Phi, DO  meclizine (ANTIVERT) 25 MG tablet Take 25 mg by mouth 3 (three) times daily as needed for dizziness or nausea.  10/18/19   [provider]  metoprolol succinate (TOPROL-XL) 25 MG 24 hr tablet Take 25 mg by mouth daily. 10/18/19   [provider]  senna-docusate (SENOKOT-S) 8.6-50 MG tablet Take 1 tablet by mouth 2 (two) times daily.    [provider]  torsemide (DEMADEX) 20 MG tablet Take 1 tablet (20 mg total) by mouth every other day. 11/06/19   Nita Sells, MD    Physical Exam:  Constitutional: Elderly male who appears to be tired Vitals:   12/23/19 0752 12/23/19 0837 12/23/19 0914 12/23/19 1026  BP: (!) 142/75 (!) 150/76 (!) 142/75   Pulse: (!) 59 66 (!) 57   Resp: 18 18 17    Temp:      TempSrc:      SpO2: 96% 97% 97%   Weight:    90.7 kg  Height:    5\' 11"  (1.803 m)   Eyes: PERRL, lids and conjunctivae normal ENMT: Mucous membranes are moist. Posterior pharynx clear of any exudate or lesions.   Neck: normal, supple, no masses, no thyromegaly.  JVD appreciated Respiratory: Mild bibasilar crackles appreciated.  Patient currently on 2 L nasal cannula oxygen with O2 saturations Cardiovascular: Regular rate and rhythm, no murmurs / rubs / gallops.  At least +1 pitting bilateral lower extremity edema. 2+ pedal pulses. No carotid bruits.  Abdomen: no tenderness, no masses palpated. No hepatosplenomegaly. Bowel sounds positive.  Musculoskeletal: no clubbing / cyanosis. No joint deformity upper and lower extremities. Good ROM, no contractures. Normal muscle tone.   Skin: no rashes, lesions, ulcers. No induration Neurologic: CN 2-12 grossly intact. Sensation intact, DTR normal. Strength 5/5 in all 4.  Psychiatric: Normal judgment and insight.  Lethargic, but oriented x 3. Normal mood.     Labs on Admission: I have personally reviewed following labs and imaging studies  CBC: Recent Labs  Lab 12/23/19 0636  WBC 8.3  NEUTROABS 5.4  HGB 11.2*  HCT 37.5*  MCV 90.6  PLT 245   Basic Metabolic Panel: Recent Labs  Lab 12/23/19 0636  NA 140  K 4.5  CL 110  CO2 21*  GLUCOSE 147*  BUN 42*  CREATININE 2.96*  CALCIUM 7.6*   GFR: Estimated Creatinine Clearance: 26.8 mL/min (A) (by C-G formula based on SCr of 2.96 mg/dL (H)). Liver Function Tests: Recent Labs  Lab 12/23/19 0636  AST 21  ALT 24  ALKPHOS 98  BILITOT 0.9  PROT 7.5  ALBUMIN 3.5   No results for input(s): LIPASE, AMYLASE in the last 168 hours. No results for input(s): AMMONIA in  the last 168 hours. Coagulation Profile: No results for input(s): INR, PROTIME in the last 168 hours. Cardiac Enzymes: No results for input(s): CKTOTAL, CKMB, CKMBINDEX, TROPONINI in the last 168 hours. BNP (last 3 results) No results for input(s): PROBNP in the last 8760 hours. HbA1C: No results for input(s): HGBA1C in the last 72 hours. CBG: No results for input(s): GLUCAP in the last 168 hours. Lipid Profile: No results for input(s): CHOL, HDL, LDLCALC, TRIG, CHOLHDL, LDLDIRECT in the last 72 hours. Thyroid Function Tests: No results for input(s): TSH, T4TOTAL, FREET4, T3FREE, THYROIDAB in the last 72 hours. Anemia Panel: No results for input(s): VITAMINB12, FOLATE, FERRITIN, TIBC, IRON, RETICCTPCT in the last 72 hours. Urine analysis:    Component Value Date/Time   COLORURINE YELLOW 10/30/2019 2300   APPEARANCEUR CLEAR 10/30/2019 2300   LABSPEC 1.014 10/30/2019 2300   PHURINE 5.0 10/30/2019 2300   GLUCOSEU NEGATIVE 10/30/2019 2300   HGBUR NEGATIVE 10/30/2019 2300   BILIRUBINUR  NEGATIVE 10/30/2019 2300   KETONESUR NEGATIVE 10/30/2019 2300   PROTEINUR 100 (A) 10/30/2019 2300   UROBILINOGEN 0.2 06/17/2010 1620   NITRITE NEGATIVE 10/30/2019 2300   LEUKOCYTESUR NEGATIVE 10/30/2019 2300   Sepsis Labs: No results found for this or any previous visit (from the past 240 hour(s)).   Radiological Exams on Admission: DG Chest 2 View  Result Date: 12/23/2019 CLINICAL DATA:  Shortness of breath. EXAM: CHEST - 2 VIEW COMPARISON:  11/05/2019. FINDINGS: Cardiomegaly. Mild pulmonary venous congestion. Bilateral interstitial prominence suggesting CHF. Prominent right base atelectasis/infiltrate and moderate right pleural effusion. No pneumothorax. Degenerative change thoracic spine. IMPRESSION: 1. Cardiomegaly. Mild pulmonary venous congestion. Bilateral interstitial prominence suggesting CHF. 2. Prominent right base atelectasis/infiltrate and moderate right pleural effusion. Electronically Signed   By: Marcello Moores  Register   On: 12/23/2019 07:08    EKG: Independently reviewed.  Normal sinus rhythm at 62 bpm with bifascicular block similar to previous tracing  Assessment/Plan Respiratory failure with hypoxia Diastolic congestive heart failure exacerbation: Acute on chronic.  On chest x-ray showing cardiomegaly with pulmonary vascular congestion and bilateral interstitial prominence suggestive of CHF with her right-sided pleural effusion.  BNP elevated at 832.6.  Initial O2 saturations noted to be around 84% on room improved with 3 L nasal cannula oxygen.  Last EF noted to be 55 to 60% by echocardiogram on 10/29/2019.  At his last discharge his dry weight was noted to be 87 kg, but he presents today at 95.5 kg.  Patient had been given 80 mg of Lasix IV.  -Admit to a telemetry bed -Heart failure orders set  initiated  -Continuous pulse oximetry with nasal cannula oxygen as needed to keep O2 saturations >92% -Strict I&Os and daily weights -Elevate lower extremities -Lasix 40 mg IV  Bid -Reassess in a.m. and adjust diuresis as needed. -Cardiology consulted, will need to follow-up for further recommendations  Chronic kidney disease stage IV: Patient baseline creatinine previously noted to be around 2.7.  Patient presents with creatinine elevated up to 2.96 with BUN 42.  Patient followed in the outpatient setting by Dr. Royce Macadamia of [facility].  Records note a prior history of cardiorenal syndrome -Continue initial -Continue to monitor kidney function with diuresis -Consider formally consulting nephrology if kidney function appears to worsen  Essential hypertension: Home blood pressure medications include amlodipine 10 mg daily, hydralazine 50 mg 3 times daily, isosorbide 30 mg nightly, metoprolol 25 mg twice daily. -Continue current regimen as tolerated   Diabetes mellitus type 2: Last hemoglobin A1c 7.1  on 10/29/2019.  Patient's wife advised against placing patient on a sliding scale due to hypoglycemia. -Hypoglycemic protocol -CBGs before every meal 4 times daily, and consider discontinuing it blood sugars remained stable -Carb modified diet  Anemia of chronic disease: Hemoglobin 11.2 which appears improved from previous.  Patient has been on iron supplementation. -Continue iron supplementation  Hypocalcemia: Acute on chronic.  On admission calcium level noted to be 7.6. -Continue calcium carbonate -Check ionized calcium in a.m.  Vertigo: Chronic.  Patient had been started on meclizine 3 times daily scheduled with some improvement in symptoms.  -Continue scheduled meclizine  DVT prophylaxis: Heparin Code Status: Full Family Communication: Wife updated at bedside Disposition Plan: Possible discharge home once medically stable Consults called: Cardiology Admission status: Inpatient   Norval Morton MD [facility] Pager [phone]   If 7PM-7AM, please contact night-coverage [email or web address] Password [facility]  12/23/2019, 10:48 AM

## 2019-12-23 NOTE — ED Triage Notes (Addendum)
Per pt hx of CHF and for past week he has been having increased Sob. Pt said he cant walk without being out of breath. Pt has no chest pain pt sats were 84 upon arrival. Placed on 3L came up to 95%

## 2019-12-24 LAB — BASIC METABOLIC PANEL
Anion gap: 10 (ref 5–15)
BUN: 42 mg/dL — ABNORMAL HIGH (ref 8–23)
CO2: 25 mmol/L (ref 22–32)
Calcium: 7.9 mg/dL — ABNORMAL LOW (ref 8.9–10.3)
Chloride: 108 mmol/L (ref 98–111)
Creatinine, Ser: 2.91 mg/dL — ABNORMAL HIGH (ref 0.61–1.24)
GFR calc Af Amer: 24 mL/min — ABNORMAL LOW (ref >60)
GFR calc non Af Amer: 21 mL/min — ABNORMAL LOW (ref >60)
Glucose, Bld: 102 mg/dL — ABNORMAL HIGH (ref 70–99)
Potassium: 4.7 mmol/L (ref 3.5–5.1)
Sodium: 143 mmol/L (ref 135–145)

## 2019-12-24 LAB — GLUCOSE, CAPILLARY
Glucose-Capillary: 88 mg/dL (ref 70–99)
Glucose-Capillary: 127 mg/dL — ABNORMAL HIGH (ref 70–99)
Glucose-Capillary: 127 mg/dL — ABNORMAL HIGH (ref 70–99)

## 2019-12-24 MED ORDER — GABAPENTIN 300 MG PO CAPS
300.0000 mg | ORAL_CAPSULE | Freq: Two times a day (BID) | ORAL | Status: DC
  Administered 2019-12-24 – 2019-12-31 (×14): 300 mg via ORAL
  Filled 2019-12-24 (×15): qty 1

## 2019-12-24 MED ORDER — TRAMADOL-ACETAMINOPHEN 37.5-325 MG PO TABS
1.0000 | ORAL_TABLET | Freq: Three times a day (TID) | ORAL | Status: DC | PRN
  Administered 2019-12-25 – 2019-12-30 (×6): 1 via ORAL
  Filled 2019-12-24 (×8): qty 1

## 2019-12-24 NOTE — Progress Notes (Signed)
Progress Note  Patient Name: Melvin Mcclure Date of Encounter: 12/24/2019  [facility] Cardiologist: Kirk Ruths, MD   Subjective   The patient complains of reproducible retrosternal chest pain that started last night and is persistent.  His shortness of breath has slightly improved.  Inpatient Medications    Scheduled Meds: . amLODipine  10 mg Oral Daily  . aspirin EC  81 mg Oral Daily  . calcitRIOL  0.25 mcg Oral Once per day on Mon Wed Fri  . calcium carbonate  2 tablet Oral Daily  . ferrous sulfate  325 mg Oral Q breakfast  . furosemide  80 mg Intravenous BID  . gabapentin  300 mg Oral TID  . heparin  5,000 Units Subcutaneous Q8H  . hydrALAZINE  50 mg Oral TID  . isosorbide mononitrate  30 mg Oral QPM  . meclizine  25 mg Oral TID  . metolazone  2.5 mg Oral Daily  . metoprolol tartrate  25 mg Oral BID  . senna-docusate  1 tablet Oral BID  . sodium chloride flush  3 mL Intravenous Q12H   Continuous Infusions: . sodium chloride     PRN Meds: sodium chloride, acetaminophen, ondansetron (ZOFRAN) IV, sodium chloride flush   Vital Signs    Vitals:   12/23/19 1813 12/23/19 2049 12/24/19 0056 12/24/19 0621  BP: 134/64 (!) 148/70 (!) 153/79 (!) 142/68  Pulse: 65 67 66 64  Resp: 17  18 16   Temp: 97.7 F (36.5 C)  98.7 F (37.1 C) 98.4 F (36.9 C)  TempSrc: Oral  Oral Oral  SpO2: 98%  92% 94%  Weight:    94.9 kg  Height:        Intake/Output Summary (Last 24 hours) at 12/24/2019 1012 Last data filed at 12/24/2019 0500 Gross per 24 hour  Intake 480 ml  Output 2160 ml  Net -1680 ml   Last 3 Weights 12/24/2019 12/23/2019 12/23/2019  Weight (lbs) 209 lb 3.5 oz 210 lb 8.6 oz 200 lb  Weight (kg) 94.9 kg 95.5 kg 90.719 kg     Telemetry    SR - Personally Reviewed  ECG    No new tracing - Personally Reviewed  Physical Exam   GEN: No acute distress.   Neck: No JVD Cardiac: RRR, no murmurs, rubs, or gallops.  Respiratory: Clear to auscultation  bilaterally. LE: 1+ B/L edema; No deformity. Neuro:  Nonfocal  Psych: Normal affect   Labs    High Sensitivity Troponin:   Recent Labs  Lab 12/23/19 0636 12/23/19 1059  TROPONINIHS 10 11     Chemistry Recent Labs  Lab 12/23/19 0636 12/24/19 0320  NA 140 143  K 4.5 4.7  CL 110 108  CO2 21* 25  GLUCOSE 147* 102*  BUN 42* 42*  CREATININE 2.96* 2.91*  CALCIUM 7.6* 7.9*  PROT 7.5  --   ALBUMIN 3.5  --   AST 21  --   ALT 24  --   ALKPHOS 98  --   BILITOT 0.9  --   GFRNONAA 20* 21*  GFRAA 24* 24*  ANIONGAP 9 10    Hematology Recent Labs  Lab 12/23/19 0636  WBC 8.3  RBC 4.14*  HGB 11.2*  HCT 37.5*  MCV 90.6  MCH 27.1  MCHC 29.9*  RDW 17.4*  PLT 277   BNP Recent Labs  Lab 12/23/19 0636  BNP 832.6*     DDimer No results for input(s): DDIMER in the last 168 hours.   Radiology  DG Chest 2 View  Result Date: 12/23/2019 CLINICAL DATA:  Shortness of breath. EXAM: CHEST - 2 VIEW COMPARISON:  11/05/2019. FINDINGS: Cardiomegaly. Mild pulmonary venous congestion. Bilateral interstitial prominence suggesting CHF. Prominent right base atelectasis/infiltrate and moderate right pleural effusion. No pneumothorax. Degenerative change thoracic spine. IMPRESSION: 1. Cardiomegaly. Mild pulmonary venous congestion. Bilateral interstitial prominence suggesting CHF. 2. Prominent right base atelectasis/infiltrate and moderate right pleural effusion. Electronically Signed   By: Marcello Moores  Register   On: 12/23/2019 07:08   Cardiac Studies    Patient Profile     69 y.o. male with a hx of chronic diastolic CHF, hypertension who is being seen today for the evaluation of acute on chronic diastolic CHF at the request of Dr.  Fuller Plan.  Assessment & Plan    1. Acute on chronic diastolic CHF, BNP 750,     -1.7 L overnight, minimal improvement, creatinine stable at 2.9             -I would continue Lasix to 80 mg IV twice daily              -Added metolazone 2.5 mg daily              -He will need work-up for possible cardiac amyloidosis with PYP scan,  2.  New chest pain, atypical retrosternal however never had ischemic work-up his CKD stage IV likely that coronary CTA or cardiac cath, will plan for nuclear stress test. 3.  Hypertension -continue current meds metoprolol and Imdur, amlodipine was added  For questions or updates, please contact [facility] Please consult [email or web address] for contact info under     Signed, Ena Dawley, MD  12/24/2019, 10:12 AM

## 2019-12-24 NOTE — Progress Notes (Signed)
PROGRESS NOTE    Melvin Mcclure  LOV:[id number] DOB: 1950/11/08 DOA: 12/23/2019 PCP: Nolene Ebbs, MD    Brief Narrative:  Melvin Mcclure is a 69 y.o. male with medical history significant of diastolic HF, hypertension, diabetes mellitus type 2, CKD stage IV, and vertigo, presents with complaints of shortness of breath this morning.  Patient had just recently been hospitalized from 6/25-7/4 for CHF exacerbation with community-acquired pneumonia.  Over the last week or so he would be getting more short of breath even walking short distances.  His wife notes that he was put back on oxygen due to his symptoms.  He had reportedly been intermittently wheezing and not sleeping well due to the shortness of breath.  Denied having any significant change in his weight, coughing, or worsening leg swelling.  His wife reported that O2 saturations were as low as the 70s this morning when she checked which prompted her to bring him to the hospital, but he also had appointment for today to follow-up with cardiology.  Patient had last been seen by his nephrologist Dr. Royce Macadamia on 8/4 where he was increased to torsemide 20 mg daily, increased Tums, and increased on calcitriol.  Her records that were available is creatinine appears to have been around 2.4.  Upon admission to the emergency department patient was seen to be afebrile, pulse 57-66, blood pressures 142/75-162/93, and O2 saturations currently maintained on 3 L of oxygen.  Labs significant for hemoglobin 11.2, BUN 42, creatinine 2.96, calcium 7.6, BNP 832.6, and high-sensitivity troponin x2.  Chest x-ray significant for signs of CHF with moderate right-sided pleural effusion.  Patient was given 80 mg of Lasix IV.  TRH called to admit   Assessment & Plan:   Principal Problem:   Acute exacerbation of CHF (congestive heart failure) (HCC) Active Problems:   CKD stage 4 due to type 2 diabetes mellitus (HCC)   Hypocalcemia   Anemia of chronic disease    Vertigo   Acute on chronic respiratory failure with hypoxia (HCC)   Acute hypoxic respiratory failure Acute on chronic diastolic congestive heart failure Diastolic congestive heart failure exacerbation: Acute on chronic.  On chest x-ray showing cardiomegaly with pulmonary vascular congestion and bilateral interstitial prominence suggestive of CHF with her right-sided pleural effusion.  BNP elevated at 832.6.  Initial O2 saturations noted to be around 84% on room improved with 3 L nasal cannula oxygen.  Last EF noted to be 55 to 60% by echocardiogram on 10/29/2019.  At his last discharge his dry weight was noted to be 87 kg, but he presents today at 95.5 kg.  Patient had been given 80 mg of Lasix IV in ED. --Cardiology following, appreciate assistance --net negative 1.9L overnight --wt 95.5>94.9kg (dry 87kg) --Continue furosemide 80 mg IV twice daily --Continue metolazone 2.5 mg p.o. daily --Continue supplemental oxygen, maintain SPO2 greater than 92% currently on 2 L nasal cannula with SPO2 94% --Fluid restriction 1500 mL/day --Strict I's and O's and daily weights  Chest pain, atypical Patient complaining of right-sided chest pain, onset after significant coughing spells over the past 3 days.  Pain seems to be reproducible with palpation over right chest area.  No previous ischemic work-ups in the past. --Cardiology plans nuclear stress test  Essential hypertension BP 142/68 this morning --Continue amlodipine 10 mg daily, hydralazine 50 mg TID, isosorbide 30 mg p.o. qHS, metoprolol 25 mg BID  Chronic kidney disease stage IV Patient baseline creatinine previously noted to be around 2.7.  Patient presents with creatinine  elevated up to 2.96 with BUN 42.  Patient followed in the outpatient setting by Dr. Royce Macadamia of [facility].  Records note a prior history of cardiorenal syndrome --Cr 2.96>2.91 --Avoid nephrotoxins, renally dose all medications --Follow BMP daily  Type 2  diabetes mellitus Hemoglobin A1c 7.1 10/29/2019. Hx of hypoglycemic episodes with insulin sliding scale --monitor CBGs before every meal/at bedtime --Carbohydrate modified diet  Anemia of chronic disease:  Hemoglobin 11.2 which appears improved from previous.  -Continue iron supplementation  Hypocalcemia: Acute on chronic.   On admission calcium level noted to be 7.6.  7.9 this morning --Continue calcium carbonate -ionized calcium level pending  Vertigo: Chronic.   Patient had been started on meclizine 3 times daily scheduled with some improvement in symptoms.  --Continue scheduled meclizine  DVT prophylaxis: Heparin Code Status: Full code Family Communication: No family present at bedside this morning  Disposition Plan:  Status is: Inpatient  Remains inpatient appropriate because:Ongoing diagnostic testing needed not appropriate for outpatient work up, Unsafe d/c plan, IV treatments appropriate due to intensity of illness or inability to take PO and Inpatient level of care appropriate due to severity of illness   Dispo: The patient is from: Home              Anticipated d/c is to: Home              Anticipated d/c date is: 3 days              Patient currently is not medically stable to d/c.   Consultants:   Cardiology  Procedures:   Nuclear med cardiac amyloid  Antimicrobials:   None   Subjective: Patient seen and examined at bedside, resting comfortably.  Continues to complain of intermittent right-sided chest discomfort.  Reports nonproductive cough for the past 3 days.  Chest pain reproducible on palpation right chest area.   Continues with dyspnea especially on exertion and continues on oxygen, now down to 2 L per nasal cannula. Cardiology following, plans nuclear medicine stress test.  No other complaints or concerns at this time.  Denies headache, no visual changes, no fever/chills/night sweats, no nausea/vomiting/diarrhea, no palpitations, no abdominal pain,  no weakness, no fatigue, no paresthesias.  No acute events overnight per nursing staff.  Objective: Vitals:   12/23/19 1813 12/23/19 2049 12/24/19 0056 12/24/19 0621  BP: 134/64 (!) 148/70 (!) 153/79 (!) 142/68  Pulse: 65 67 66 64  Resp: 17  18 16   Temp: 97.7 F (36.5 C)  98.7 F (37.1 C) 98.4 F (36.9 C)  TempSrc: Oral  Oral Oral  SpO2: 98%  92% 94%  Weight:    94.9 kg  Height:        Intake/Output Summary (Last 24 hours) at 12/24/2019 1220 Last data filed at 12/24/2019 0500 Gross per 24 hour  Intake 480 ml  Output 2160 ml  Net -1680 ml   Filed Weights   12/23/19 1026 12/23/19 1511 12/24/19 0621  Weight: 90.7 kg 95.5 kg 94.9 kg    Examination:  General exam: Appears calm and comfortable  Respiratory system: Clear to auscultation. Respiratory effort normal.  On 2 L nasal cannula with SPO2 94%, not on home oxygen Cardiovascular system: S1 & S2 heard, RRR. No JVD, murmurs, rubs, gallops or clicks. No pedal edema. Gastrointestinal system: Abdomen is nondistended, soft and nontender. No organomegaly or masses felt. Normal bowel sounds heard. Central nervous system: Alert and oriented. No focal neurological deficits. Extremities: Symmetric 5 x 5 power.  Skin: No rashes, lesions or ulcers Psychiatry: Judgement and insight appear normal. Mood & affect appropriate.     Data Reviewed: I have personally reviewed following labs and imaging studies  CBC: Recent Labs  Lab 12/23/19 0636  WBC 8.3  NEUTROABS 5.4  HGB 11.2*  HCT 37.5*  MCV 90.6  PLT 009   Basic Metabolic Panel: Recent Labs  Lab 12/23/19 0636 12/24/19 0320  NA 140 143  K 4.5 4.7  CL 110 108  CO2 21* 25  GLUCOSE 147* 102*  BUN 42* 42*  CREATININE 2.96* 2.91*  CALCIUM 7.6* 7.9*   GFR: Estimated Creatinine Clearance: 27.3 mL/min (A) (by C-G formula based on SCr of 2.91 mg/dL (H)). Liver Function Tests: Recent Labs  Lab 12/23/19 0636  AST 21  ALT 24  ALKPHOS 98  BILITOT 0.9  PROT 7.5  ALBUMIN  3.5   No results for input(s): LIPASE, AMYLASE in the last 168 hours. No results for input(s): AMMONIA in the last 168 hours. Coagulation Profile: No results for input(s): INR, PROTIME in the last 168 hours. Cardiac Enzymes: No results for input(s): CKTOTAL, CKMB, CKMBINDEX, TROPONINI in the last 168 hours. BNP (last 3 results) No results for input(s): PROBNP in the last 8760 hours. HbA1C: No results for input(s): HGBA1C in the last 72 hours. CBG: Recent Labs  Lab 12/23/19 2127 12/24/19 0622 12/24/19 1101  GLUCAP 140* 88 127*   Lipid Profile: No results for input(s): CHOL, HDL, LDLCALC, TRIG, CHOLHDL, LDLDIRECT in the last 72 hours. Thyroid Function Tests: No results for input(s): TSH, T4TOTAL, FREET4, T3FREE, THYROIDAB in the last 72 hours. Anemia Panel: No results for input(s): VITAMINB12, FOLATE, FERRITIN, TIBC, IRON, RETICCTPCT in the last 72 hours. Sepsis Labs: No results for input(s): PROCALCITON, LATICACIDVEN in the last 168 hours.  Recent Results (from the past 240 hour(s))  SARS Coronavirus 2 by RT PCR (hospital order, performed in [facility] hospital lab) Nasopharyngeal Nasopharyngeal Swab     Status: None   Collection Time: 12/23/19 10:59 AM   Specimen: Nasopharyngeal Swab  Result Value Ref Range Status   SARS Coronavirus 2 NEGATIVE NEGATIVE Final    Comment: (NOTE) SARS-CoV-2 target nucleic acids are NOT DETECTED.  The SARS-CoV-2 RNA is generally detectable in upper and lower respiratory specimens during the acute phase of infection. The lowest concentration of SARS-CoV-2 viral copies this assay can detect is 250 copies / mL. A negative result does not preclude SARS-CoV-2 infection and should not be used as the sole basis for treatment or other patient management decisions.  A negative result may occur with improper specimen collection / handling, submission of specimen other than nasopharyngeal swab, presence of viral mutation(s) within the areas targeted  by this assay, and inadequate number of viral copies (<250 copies / mL). A negative result must be combined with clinical observations, patient history, and epidemiological information.  Fact Sheet for Patients:   StrictlyIdeas.no  Fact Sheet for Healthcare Providers: BankingDealers.co.za  This test is not yet approved or  cleared by the [place] FDA and has been authorized for detection and/or diagnosis of SARS-CoV-2 by FDA under an Emergency Use Authorization (EUA).  This EUA will remain in effect (meaning this test can be used) for the duration of the COVID-19 declaration under Section 564(b)(1) of the Act, 21 U.S.C. section 360bbb-3(b)(1), unless the authorization is terminated or revoked sooner.  Performed at [facility], [place] [address], [place], [place] [address]  Radiology Studies: DG Chest 2 View  Result Date: 12/23/2019 CLINICAL DATA:  Shortness of breath. EXAM: CHEST - 2 VIEW COMPARISON:  11/05/2019. FINDINGS: Cardiomegaly. Mild pulmonary venous congestion. Bilateral interstitial prominence suggesting CHF. Prominent right base atelectasis/infiltrate and moderate right pleural effusion. No pneumothorax. Degenerative change thoracic spine. IMPRESSION: 1. Cardiomegaly. Mild pulmonary venous congestion. Bilateral interstitial prominence suggesting CHF. 2. Prominent right base atelectasis/infiltrate and moderate right pleural effusion. Electronically Signed   By: [place]   On: 12/23/2019 07:08        Scheduled Meds: . amLODipine  10 mg Oral Daily  . aspirin EC  81 mg Oral Daily  . calcitRIOL  0.25 mcg Oral Once per day on Mon Wed Fri  . calcium carbonate  2 tablet Oral Daily  . ferrous sulfate  325 mg Oral Q breakfast  . furosemide  80 mg Intravenous BID  . gabapentin  300 mg Oral TID  . heparin  5,000 Units Subcutaneous Q8H  . hydrALAZINE  50 mg Oral TID  . isosorbide mononitrate  30 mg  Oral QPM  . meclizine  25 mg Oral TID  . metolazone  2.5 mg Oral Daily  . metoprolol tartrate  25 mg Oral BID  . senna-docusate  1 tablet Oral BID  . sodium chloride flush  3 mL Intravenous Q12H   Continuous Infusions: . sodium chloride       LOS: 1 day    Time spent: 39 minutes spent on chart review, discussion with nursing staff, consultants, updating family and interview/physical exam; more than 50% of that time was spent in counseling and/or coordination of care.    Cailen Mihalik J [place] DO [facility] Available via Epic secure chat 7am-7pm After these hours, please refer to coverage provider listed on [email or web address] 12/24/2019, 12:20 PM

## 2019-12-25 ENCOUNTER — Inpatient Hospital Stay (HOSPITAL_COMMUNITY): Payer: Medicare Other

## 2019-12-25 LAB — GLUCOSE, CAPILLARY
Glucose-Capillary: 124 mg/dL — ABNORMAL HIGH (ref 70–99)
Glucose-Capillary: 101 mg/dL — ABNORMAL HIGH (ref 70–99)
Glucose-Capillary: 118 mg/dL — ABNORMAL HIGH (ref 70–99)
Glucose-Capillary: 108 mg/dL — ABNORMAL HIGH (ref 70–99)

## 2019-12-25 LAB — BASIC METABOLIC PANEL
Anion gap: 12 (ref 5–15)
BUN: 39 mg/dL — ABNORMAL HIGH (ref 8–23)
CO2: 27 mmol/L (ref 22–32)
Calcium: 8 mg/dL — ABNORMAL LOW (ref 8.9–10.3)
Chloride: 102 mmol/L (ref 98–111)
Creatinine, Ser: 2.92 mg/dL — ABNORMAL HIGH (ref 0.61–1.24)
GFR calc Af Amer: 24 mL/min — ABNORMAL LOW (ref >60)
GFR calc non Af Amer: 21 mL/min — ABNORMAL LOW (ref >60)
Glucose, Bld: 118 mg/dL — ABNORMAL HIGH (ref 70–99)
Potassium: 3.8 mmol/L (ref 3.5–5.1)
Sodium: 141 mmol/L (ref 135–145)

## 2019-12-25 LAB — TROPONIN I (HIGH SENSITIVITY)
Troponin I (High Sensitivity): 13 ng/L (ref <18)
Troponin I (High Sensitivity): 12 ng/L (ref <18)

## 2019-12-25 MED ORDER — SENNOSIDES-DOCUSATE SODIUM 8.6-50 MG PO TABS
1.0000 | ORAL_TABLET | Freq: Two times a day (BID) | ORAL | Status: DC | PRN
  Administered 2019-12-30: 1 via ORAL
  Filled 2019-12-25: qty 1

## 2019-12-25 MED ORDER — METOLAZONE 5 MG PO TABS
5.0000 mg | ORAL_TABLET | Freq: Every day | ORAL | Status: DC
  Administered 2019-12-25 – 2019-12-26 (×2): 5 mg via ORAL
  Filled 2019-12-25 (×3): qty 1

## 2019-12-25 MED ORDER — NITROGLYCERIN 0.4 MG SL SUBL: 0.4000 mg | SUBLINGUAL_TABLET | SUBLINGUAL | Status: DC | PRN

## 2019-12-25 MED ORDER — CARVEDILOL 12.5 MG PO TABS
12.5000 mg | ORAL_TABLET | Freq: Two times a day (BID) | ORAL | Status: DC
  Administered 2019-12-25 – 2019-12-31 (×13): 12.5 mg via ORAL
  Filled 2019-12-25 (×13): qty 1

## 2019-12-25 MED ORDER — ISOSORBIDE MONONITRATE ER 60 MG PO TB24
60.0000 mg | ORAL_TABLET | Freq: Every evening | ORAL | Status: DC
  Administered 2019-12-25 – 2019-12-30 (×6): 60 mg via ORAL
  Filled 2019-12-25 (×6): qty 1

## 2019-12-25 NOTE — Progress Notes (Signed)
SATURATION QUALIFICATIONS  Patient Saturations on Room Air at Rest = 89%  Patient Saturations on Room Air while Ambulating = 86%  Patient Saturations on 2 Liters of oxygen while Ambulating = 95%  Pt desaturates while at rest and while ambulating without oxygen.

## 2019-12-25 NOTE — Progress Notes (Signed)
Progress Note  Patient Name: Melvin Mcclure Date of Encounter: 12/25/2019  [facility] Cardiologist: Kirk Ruths, MD   Subjective   The patient complains of reproducible retrosternal chest pain that started last night and is persistent.  His shortness of breath has slightly improved.  Inpatient Medications    Scheduled Meds: . amLODipine  10 mg Oral Daily  . aspirin EC  81 mg Oral Daily  . calcitRIOL  0.25 mcg Oral Once per day on Mon Wed Fri  . calcium carbonate  2 tablet Oral Daily  . ferrous sulfate  325 mg Oral Q breakfast  . furosemide  80 mg Intravenous BID  . gabapentin  300 mg Oral BID  . heparin  5,000 Units Subcutaneous Q8H  . hydrALAZINE  50 mg Oral TID  . isosorbide mononitrate  30 mg Oral QPM  . meclizine  25 mg Oral TID  . metolazone  2.5 mg Oral Daily  . metoprolol tartrate  25 mg Oral BID  . senna-docusate  1 tablet Oral BID  . sodium chloride flush  3 mL Intravenous Q12H   Continuous Infusions: . sodium chloride     PRN Meds: sodium chloride, acetaminophen, nitroGLYCERIN, ondansetron (ZOFRAN) IV, sodium chloride flush, traMADol-acetaminophen   Vital Signs    Vitals:   12/24/19 1808 12/24/19 2300 12/25/19 0422 12/25/19 0500  BP: 140/68 (!) 173/84 (!) 159/81   Pulse: 67 74 67   Resp: 16 16 16    Temp: 97.6 F (36.4 C) 99.1 F (37.3 C) 98.7 F (37.1 C)   TempSrc: Oral Oral Oral   SpO2: 90% 95% 96%   Weight:    94.1 kg  Height:        Intake/Output Summary (Last 24 hours) at 12/25/2019 0839 Last data filed at 12/25/2019 7342 Gross per 24 hour  Intake 1360 ml  Output 1975 ml  Net -615 ml   Last 3 Weights 12/25/2019 12/24/2019 12/23/2019  Weight (lbs) 207 lb 7.3 oz 209 lb 3.5 oz 210 lb 8.6 oz  Weight (kg) 94.1 kg 94.9 kg 95.5 kg     Telemetry    SR - Personally Reviewed  ECG    No new tracing - Personally Reviewed  Physical Exam   GEN: No acute distress.   Neck: No JVD Cardiac: RRR, no murmurs, rubs, or gallops.  Respiratory:  Clear to auscultation bilaterally. LE: 1+ B/L edema; No deformity. Neuro:  Nonfocal  Psych: Normal affect   Labs    High Sensitivity Troponin:   Recent Labs  Lab 12/23/19 0636 12/23/19 1059 12/25/19 0456  TROPONINIHS 10 11 13      Chemistry Recent Labs  Lab 12/23/19 0636 12/24/19 0320 12/25/19 0456  NA 140 143 141  K 4.5 4.7 3.8  CL 110 108 102  CO2 21* 25 27  GLUCOSE 147* 102* 118*  BUN 42* 42* 39*  CREATININE 2.96* 2.91* 2.92*  CALCIUM 7.6* 7.9* 8.0*  PROT 7.5  --   --   ALBUMIN 3.5  --   --   AST 21  --   --   ALT 24  --   --   ALKPHOS 98  --   --   BILITOT 0.9  --   --   GFRNONAA 20* 21* 21*  GFRAA 24* 24* 24*  ANIONGAP 9 10 12     Hematology Recent Labs  Lab 12/23/19 0636  WBC 8.3  RBC 4.14*  HGB 11.2*  HCT 37.5*  MCV 90.6  MCH 27.1  MCHC 29.9*  RDW 17.4*  PLT 277   BNP Recent Labs  Lab 12/23/19 0636  BNP 832.6*     DDimer No results for input(s): DDIMER in the last 168 hours.   Radiology    No results found. Cardiac Studies    Patient Profile     69 y.o. male with a hx of chronic diastolic CHF, hypertension who is being seen today for the evaluation of acute on chronic diastolic CHF at the request of Dr.  Fuller Plan.  Assessment & Plan    1. Acute on chronic diastolic CHF, BNP 989,     -0.6 L overnight, 2.3 L since the admission. minimal improvement, creatinine stable at 2.9             -I would continue Lasix to 80 mg IV twice daily              -increase metolazone to 5 mg daily             -He will need work-up for possible cardiac amyloidosis with PYP scan,  2.  New chest pain, atypical retrosternal however never had ischemic work-up his CKD stage IV likely that coronary CTA or cardiac cath, will plan for nuclear stress test. 3.  Hypertension -switch metoprolol to carvedilol 12.5 mg po bid, increase imdur to 60 mg po daily, continue amlodipine and hydralazine - obtain renal US, consult nephrology  For questions or updates,  please contact [facility] Please consult [email or web address] for contact info under     Signed, Ena Dawley, MD  12/25/2019, 8:39 AM

## 2019-12-25 NOTE — Progress Notes (Signed)
Encountered pt wife in room states  pt having chest pain wife requesting another EKG, writer informed was done this am and was normal finding, but still request another EKG and vitals. md informed

## 2019-12-25 NOTE — Progress Notes (Signed)
PROGRESS NOTE    Melvin Mcclure  KCL:[id number] DOB: 06-09-1950 DOA: 12/23/2019 PCP: Nolene Ebbs, MD    Brief Narrative:  Melvin Mcclure is a 69 y.o. male with medical history significant of diastolic HF, hypertension, diabetes mellitus type 2, CKD stage IV, and vertigo, presents with complaints of shortness of breath this morning.  Patient had just recently been hospitalized from 6/25-7/4 for CHF exacerbation with community-acquired pneumonia.  Over the last week or so he would be getting more short of breath even walking short distances.  His wife notes that he was put back on oxygen due to his symptoms.  He had reportedly been intermittently wheezing and not sleeping well due to the shortness of breath.  Denied having any significant change in his weight, coughing, or worsening leg swelling.  His wife reported that O2 saturations were as low as the 70s this morning when she checked which prompted her to bring him to the hospital, but he also had appointment for today to follow-up with cardiology.  Patient had last been seen by his nephrologist Dr. Royce Macadamia on 8/4 where he was increased to torsemide 20 mg daily, increased Tums, and increased on calcitriol.  Her records that were available is creatinine appears to have been around 2.4.  Upon admission to the emergency department patient was seen to be afebrile, pulse 57-66, blood pressures 142/75-162/93, and O2 saturations currently maintained on 3 L of oxygen.  Labs significant for hemoglobin 11.2, BUN 42, creatinine 2.96, calcium 7.6, BNP 832.6, and high-sensitivity troponin x2.  Chest x-ray significant for signs of CHF with moderate right-sided pleural effusion.  Patient was given 80 mg of Lasix IV.  TR[facility] called to admit   Assessment & Plan:   Principal Problem:   Acute exacerbation of CHF (congestive heart failure) (HCC) Active Problems:   CKD stage 4 due to type 2 diabetes mellitus (HCC)   Hypocalcemia   Anemia of chronic disease    Vertigo   Acute on chronic respiratory failure with hypoxia (HCC)   Acute hypoxic respiratory failure Acute on chronic diastolic congestive heart failure Diastolic congestive heart failure exacerbation: Acute on chronic.  On chest x-ray showing cardiomegaly with pulmonary vascular congestion and bilateral interstitial prominence suggestive of CHF with her right-sided pleural effusion.  BNP elevated at 832.6.  Initial O2 saturations noted to be around 84% on room improved with 3 L nasal cannula oxygen.  Last EF noted to be 55 to 60% by echocardiogram on 10/29/2019.  At his last discharge his dry weight was noted to be 87 kg, but he presents today at 95.5 kg.  Patient had been given 80 mg of Lasix IV in ED. --Cardiology following, appreciate assistance --net negative 395mL past 24h --wt 95.5>94.9>94.1kg (dry 87kg) --Continue furosemide 80 mg IV twice daily --metolazone increased to 5 mg p.o. daily today by cardiology --Continue supplemental oxygen, maintain SPO2 greater than 92% currently on 1 L nasal cannula with SPO2 94% --Fluid restriction 1500 mL/day --Strict I's and O's and daily weights  Chest pain, atypical Patient complaining of right-sided chest pain, onset after significant coughing spells over the past 3 days.  Pain seems to be reproducible with palpation over right chest area.  No previous ischemic work-ups in the past. --Cardiology plans nuclear stress test  Right pleural effusion --Repeat chest x-ray in the a.m. for interval evaluation of pleural effusion --If significant size persists, consider IR for thoracentesis  Essential hypertension BP 159/81 this morning --Continue amlodipine 10 mg daily --hydralazine 50 mg TID --  metoprolol 25 mg BID --isosorbide increased to 60 mg p.o. qHS  Chronic kidney disease stage IV Patient baseline creatinine previously noted to be around 2.7.  Patient presents with creatinine elevated up to 2.96 with BUN 42.  Patient followed in the  outpatient setting by Dr. Royce Macadamia of [facility]  Records note a prior history of cardiorenal syndrome --Cr 2.96>2.91>2.92 --renal U/S pending --Avoid nephrotoxins, renally dose all medications --Follow BMP daily  Type 2 diabetes mellitus Hemoglobin A1c 7.1 10/29/2019. Hx of hypoglycemic episodes with insulin sliding scale --monitor CBGs before every meal/at bedtime --Carbohydrate modified diet  Anemia of chronic disease:  Hemoglobin 11.2 which appears improved from previous.  -Continue iron supplementation  Hypocalcemia: Acute on chronic.   On admission calcium level noted to be 7.6.  7.9 this morning --Continue calcium carbonate --ionized calcium level pending  Vertigo: Chronic.   Patient had been started on meclizine TID with some improvement in symptoms.  --Continue scheduled meclizine  DVT prophylaxis: Heparin Code Status: Full code Family Communication: No family present at bedside this morning  Disposition Plan:  Status is: Inpatient  Remains inpatient appropriate because:Ongoing diagnostic testing needed not appropriate for outpatient work up, Unsafe d/c plan, IV treatments appropriate due to intensity of illness or inability to take PO and Inpatient level of care appropriate due to severity of illness   Dispo: The patient is from: Home              Anticipated d/c is to: Home              Anticipated d/c date is: 2 days              Patient currently is not medically stable to d/c.   Consultants:   Cardiology  Procedures:   Nuclear med cardiac amyloid  Antimicrobials:   None   Subjective: Patient seen and examined at bedside, resting comfortably.  Overnight continues to complain of persistent right chest wall discomfort.  Repeat troponin overnight 13, within normal limits.  Supplemental oxygen now down to 1 L per nasal cannula. No other complaints or concerns at this time.  Denies headache, no visual changes, no fever/chills/night sweats,  no nausea/vomiting/diarrhea, no palpitations, no abdominal pain, no weakness, no fatigue, no paresthesias.  No acute events overnight per nursing staff.  Objective: Vitals:   12/24/19 1808 12/24/19 2300 12/25/19 0422 12/25/19 0500  BP: 140/68 (!) 173/84 (!) 159/81   Pulse: 67 74 67   Resp: 16 16 16    Temp: 97.6 F (36.4 C) 99.1 F (37.3 C) 98.7 F (37.1 C)   TempSrc: Oral Oral Oral   SpO2: 90% 95% 96%   Weight:    94.1 kg  Height:        Intake/Output Summary (Last 24 hours) at 12/25/2019 1202 Last data filed at 12/25/2019 6761 Gross per 24 hour  Intake 1120 ml  Output 1975 ml  Net -855 ml   Filed Weights   12/23/19 1511 12/24/19 0621 12/25/19 0500  Weight: 95.5 kg 94.9 kg 94.1 kg    Examination:  General exam: Appears calm and comfortable  Respiratory system: Clear to auscultation. Respiratory effort normal.  On 1 L nasal cannula with SPO2 94%, not on home oxygen Cardiovascular system: S1 & S2 heard, RRR. No JVD, murmurs, rubs, gallops or clicks. No pedal edema.  Reproducible right-sided chest wall pain with palpation Gastrointestinal system: Abdomen is nondistended, soft and nontender. No organomegaly or masses felt. Normal bowel sounds heard. Central nervous system:  Alert and oriented. No focal neurological deficits. Extremities: Symmetric 5 x 5 power. Skin: No rashes, lesions or ulcers Psychiatry: Judgement and insight appear normal. Mood & affect appropriate.     Data Reviewed: I have personally reviewed following labs and imaging studies  CBC: Recent Labs  Lab 12/23/19 0636  WBC 8.3  NEUTROABS 5.4  HGB 11.2*  HCT 37.5*  MCV 90.6  PLT 245   Basic Metabolic Panel: Recent Labs  Lab 12/23/19 0636 12/24/19 0320 12/25/19 0456  NA 140 143 141  K 4.5 4.7 3.8  CL 110 108 102  CO2 21* 25 27  GLUCOSE 147* 102* 118*  BUN 42* 42* 39*  CREATININE 2.96* 2.91* 2.92*  CALCIUM 7.6* 7.9* 8.0*   GFR: Estimated Creatinine Clearance: 27.1 mL/min (A) (by C-G  formula based on SCr of 2.92 mg/dL (H)). Liver Function Tests: Recent Labs  Lab 12/23/19 0636  AST 21  ALT 24  ALKPHOS 98  BILITOT 0.9  PROT 7.5  ALBUMIN 3.5   No results for input(s): LIPASE, AMYLASE in the last 168 hours. No results for input(s): AMMONIA in the last 168 hours. Coagulation Profile: No results for input(s): INR, PROTIME in the last 168 hours. Cardiac Enzymes: No results for input(s): CKTOTAL, CKMB, CKMBINDEX, TROPONINI in the last 168 hours. BNP (last 3 results) No results for input(s): PROBNP in the last 8760 hours. HbA1C: No results for input(s): HGBA1C in the last 72 hours. CBG: Recent Labs  Lab 12/24/19 1101 12/24/19 1602 12/24/19 2114 12/25/19 0549 12/25/19 1122  GLUCAP 127* 127* 124* 101* 118*   Lipid Profile: No results for input(s): CHOL, HDL, LDLCALC, TRIG, CHOLHDL, LDLDIRECT in the last 72 hours. Thyroid Function Tests: No results for input(s): TSH, T4TOTAL, FREET4, T3FREE, THYROIDAB in the last 72 hours. Anemia Panel: No results for input(s): VITAMINB12, FOLATE, FERRITIN, TIBC, IRON, RETICCTPCT in the last 72 hours. Sepsis Labs: No results for input(s): PROCALCITON, LATICACIDVEN in the last 168 hours.  Recent Results (from the past 240 hour(s))  SARS Coronavirus 2 by RT PCR (hospital order, performed in [facility] hospital lab) Nasopharyngeal Nasopharyngeal Swab     Status: None   Collection Time: 12/23/19 10:59 AM   Specimen: Nasopharyngeal Swab  Result Value Ref Range Status   SARS Coronavirus 2 NEGATIVE NEGATIVE Final    Comment: (NOTE) SARS-CoV-2 target nucleic acids are NOT DETECTED.  The SARS-CoV-2 RNA is generally detectable in upper and lower respiratory specimens during the acute phase of infection. The lowest concentration of SARS-CoV-2 viral copies this assay can detect is 250 copies / mL. A negative result does not preclude SARS-CoV-2 infection and should not be used as the sole basis for treatment or other patient  management decisions.  A negative result may occur with improper specimen collection / handling, submission of specimen other than nasopharyngeal swab, presence of viral mutation(s) within the areas targeted by this assay, and inadequate number of viral copies (<250 copies / mL). A negative result must be combined with clinical observations, patient history, and epidemiological information.  Fact Sheet for Patients:   StrictlyIdeas.no  Fact Sheet for Healthcare Providers: BankingDealers.co.za  This test is not yet approved or  cleared by the [place] FDA and has been authorized for detection and/or diagnosis of SARS-CoV-2 by FDA under an Emergency Use Authorization (EUA).  This EUA will remain in effect (meaning this test can be used) for the duration of the COVID-19 declaration under Section 564(b)(1) of the Act, 21 U.S.C. section 360bbb-3(b)(1), unless the  authorization is terminated or revoked sooner.  Performed at [facility], [place] [address] [place], [place] [address]          Radiology Studies: No results found.      Scheduled Meds: . amLODipine  10 mg Oral Daily  . aspirin EC  81 mg Oral Daily  . calcitRIOL  0.25 mcg Oral Once per day on Mon Wed Fri  . calcium carbonate  2 tablet Oral Daily  . carvedilol  12.5 mg Oral BID WC  . ferrous sulfate  325 mg Oral Q breakfast  . furosemide  80 mg Intravenous BID  . gabapentin  300 mg Oral BID  . heparin  5,000 Units Subcutaneous Q8H  . hydrALAZINE  50 mg Oral TID  . isosorbide mononitrate  60 mg Oral QPM  . meclizine  25 mg Oral TID  . metolazone  5 mg Oral Daily  . senna-docusate  1 tablet Oral BID  . sodium chloride flush  3 mL Intravenous Q12H   Continuous Infusions: . sodium chloride       LOS: 2 days    Time spent: 39 minutes spent on chart review, discussion with nursing staff, consultants, updating family and interview/physical exam; more than  50% of that time was spent in counseling and/or coordination of care.    Ellyssa Zagal J [place] DO [facility] Available via Epic secure chat 7am-7pm After these hours, please refer to coverage provider listed on [email or web address] 12/25/2019, 12:02 PM

## 2019-12-26 ENCOUNTER — Inpatient Hospital Stay (HOSPITAL_COMMUNITY): Payer: Medicare Other

## 2019-12-26 DIAGNOSIS — R0602 Shortness of breath: Secondary | ICD-10-CM

## 2019-12-26 DIAGNOSIS — E1122 Type 2 diabetes mellitus with diabetic chronic kidney disease: Secondary | ICD-10-CM

## 2019-12-26 DIAGNOSIS — J90 Pleural effusion, not elsewhere classified: Secondary | ICD-10-CM

## 2019-12-26 DIAGNOSIS — J9601 Acute respiratory failure with hypoxia: Secondary | ICD-10-CM

## 2019-12-26 DIAGNOSIS — N184 Chronic kidney disease, stage 4 (severe): Secondary | ICD-10-CM

## 2019-12-26 DIAGNOSIS — R0782 Intercostal pain: Secondary | ICD-10-CM

## 2019-12-26 HISTORY — PX: IR THORACENTESIS ASP PLEURAL SPACE W/IMG GUIDE: IMG5380

## 2019-12-26 LAB — BODY FLUID CELL COUNT WITH DIFFERENTIAL
Eos, Fluid: 0 %
Lymphs, Fluid: 78 %
Monocyte-Macrophage-Serous Fluid: 21 % — ABNORMAL LOW (ref 50–90)
Neutrophil Count, Fluid: 1 % (ref 0–25)
Total Nucleated Cell Count, Fluid: 1429 cu mm — ABNORMAL HIGH (ref 0–1000)

## 2019-12-26 LAB — GRAM STAIN

## 2019-12-26 LAB — LACTATE DEHYDROGENASE: LDH: 202 U/L — ABNORMAL HIGH (ref 98–192)

## 2019-12-26 LAB — BASIC METABOLIC PANEL
Anion gap: 15 (ref 5–15)
BUN: 39 mg/dL — ABNORMAL HIGH (ref 8–23)
CO2: 27 mmol/L (ref 22–32)
Calcium: 8.2 mg/dL — ABNORMAL LOW (ref 8.9–10.3)
Chloride: 99 mmol/L (ref 98–111)
Creatinine, Ser: 3.16 mg/dL — ABNORMAL HIGH (ref 0.61–1.24)
GFR calc Af Amer: 22 mL/min — ABNORMAL LOW (ref >60)
GFR calc non Af Amer: 19 mL/min — ABNORMAL LOW (ref >60)
Glucose, Bld: 170 mg/dL — ABNORMAL HIGH (ref 70–99)
Potassium: 4.2 mmol/L (ref 3.5–5.1)
Sodium: 141 mmol/L (ref 135–145)

## 2019-12-26 LAB — GLUCOSE, CAPILLARY
Glucose-Capillary: 124 mg/dL — ABNORMAL HIGH (ref 70–99)
Glucose-Capillary: 154 mg/dL — ABNORMAL HIGH (ref 70–99)
Glucose-Capillary: 158 mg/dL — ABNORMAL HIGH (ref 70–99)
Glucose-Capillary: 197 mg/dL — ABNORMAL HIGH (ref 70–99)
Glucose-Capillary: 202 mg/dL — ABNORMAL HIGH (ref 70–99)

## 2019-12-26 LAB — PROTEIN, PLEURAL OR PERITONEAL FLUID: Total protein, fluid: 3.6 g/dL

## 2019-12-26 LAB — CALCIUM, IONIZED: Calcium, Ionized, Serum: 4.3 mg/dL — ABNORMAL LOW (ref 4.5–5.6)

## 2019-12-26 LAB — GLUCOSE, PLEURAL OR PERITONEAL FLUID: Glucose, Fluid: 166 mg/dL

## 2019-12-26 LAB — PROTEIN, TOTAL: Total Protein: 7.7 g/dL (ref 6.5–8.1)

## 2019-12-26 LAB — LACTATE DEHYDROGENASE, PLEURAL OR PERITONEAL FLUID: LD, Fluid: 100 U/L — ABNORMAL HIGH (ref 3–23)

## 2019-12-26 LAB — IRON AND TIBC
Iron: 42 ug/dL — ABNORMAL LOW (ref 45–182)
Saturation Ratios: 19 % (ref 17.9–39.5)
TIBC: 221 ug/dL — ABNORMAL LOW (ref 250–450)
UIBC: 179 ug/dL

## 2019-12-26 LAB — FERRITIN: Ferritin: 143 ng/mL (ref 24–336)

## 2019-12-26 MED ORDER — TECHNETIUM TC 99M SESTAMIBI - CARDIOLITE
25.4000 | Freq: Once | INTRAVENOUS | Status: AC | PRN
  Administered 2019-12-26: 20.2 via INTRAVENOUS

## 2019-12-26 MED ORDER — LIDOCAINE HCL 1 % IJ SOLN
INTRAMUSCULAR | Status: DC | PRN
  Administered 2019-12-26: 10 mL

## 2019-12-26 MED ORDER — LIDOCAINE HCL 1 % IJ SOLN
INTRAMUSCULAR | Status: AC
  Filled 2019-12-26: qty 20

## 2019-12-26 NOTE — Progress Notes (Signed)
PROGRESS NOTE    Melvin Mcclure  EHM:[id number] DOB: November 19, 1950 DOA: 12/23/2019 PCP: Nolene Ebbs, MD    Brief Narrative:  Melvin Mcclure is a 69 y.o. male with medical history significant of diastolic HF, hypertension, diabetes mellitus type 2, CKD stage IV, and vertigo, presents with complaints of shortness of breath this morning.  Patient had just recently been hospitalized from 6/25-7/4 for CHF exacerbation with community-acquired pneumonia.  Over the last week or so he would be getting more short of breath even walking short distances.  His wife notes that he was put back on oxygen due to his symptoms.  He had reportedly been intermittently wheezing and not sleeping well due to the shortness of breath.  Denied having any significant change in his weight, coughing, or worsening leg swelling.  His wife reported that O2 saturations were as low as the 70s this morning when she checked which prompted her to bring him to the hospital, but he also had appointment for today to follow-up with cardiology.  Patient had last been seen by his nephrologist Dr. Royce Macadamia on 8/4 where he was increased to torsemide 20 mg daily, increased Tums, and increased on calcitriol.  Her records that were available is creatinine appears to have been around 2.4.  Upon admission to the emergency department patient was seen to be afebrile, pulse 57-66, blood pressures 142/75-162/93, and O2 saturations currently maintained on 3 L of oxygen.  Labs significant for hemoglobin 11.2, BUN 42, creatinine 2.96, calcium 7.6, BNP 832.6, and high-sensitivity troponin x2.  Chest x-ray significant for signs of CHF with moderate right-sided pleural effusion.  Patient was given 80 mg of Lasix IV.  TRH called to admit   Assessment & Plan:   Principal Problem:   Acute exacerbation of CHF (congestive heart failure) (HCC) Active Problems:   CKD stage 4 due to type 2 diabetes mellitus (HCC)   Hypocalcemia   Anemia of chronic disease    Vertigo   Acute on chronic respiratory failure with hypoxia (HCC)   Acute hypoxic respiratory failure Acute on chronic diastolic congestive heart failure Diastolic congestive heart failure exacerbation: Acute on chronic.  On chest x-ray showing cardiomegaly with pulmonary vascular congestion and bilateral interstitial prominence suggestive of CHF with her right-sided pleural effusion.  BNP elevated at 832.6.  Initial O2 saturations noted to be around 84% on room improved with 3 L nasal cannula oxygen.  Last EF noted to be 55 to 60% by echocardiogram on 10/29/2019.  At his last discharge his dry weight was noted to be 87 kg, but he presents today at 95.5 kg.  Patient had been given 80 mg of Lasix IV in ED. --Cardiology following, appreciate assistance --net negative 32mL past 24h; net negative 2.1L past 24h --wt 95.5>94.9>94.1kg (dry 87kg) --Continue furosemide 80 mg IV twice daily --metolazone 5 mg p.o. daily  --Continue supplemental oxygen, maintain SPO2 greater than 92% currently on 1 L nasal cannula with SPO2 94% --Fluid restriction 1500 mL/day --Strict I's and O's and daily weights  Chest pain, atypical Patient complaining of right-sided chest pain, onset after significant coughing spells over the past 3 days.  Pain seems to be reproducible with palpation over right chest area.  No previous ischemic work-ups in the past. --Cardiology plans nuclear stress test  Right pleural effusion Patient with moderate right-sided pleural effusion. --IR for consideration of ultrasound-guided thoracentesis  Essential hypertension BP 159/81 this morning --Continue amlodipine 10 mg daily --hydralazine 50 mg TID --metoprolol 25 mg BID --isosorbide increased to  60 mg p.o. qHS  Chronic kidney disease stage IV Patient baseline creatinine previously noted to be around 2.7.  Patient presents with creatinine elevated up to 2.96 with BUN 42.  Patient followed in the outpatient setting by Dr. Royce Macadamia of  [facility].  Records note a prior history of cardiorenal syndrome.  Renal ultrasound with no evidence of hydronephrosis, echogenic renal parenchyma bilaterally consistent with medical renal disease. --Cr 2.96>2.91>2.92>3.16 --renal U/S pending --Avoid nephrotoxins, renally dose all medications --Follow BMP daily  Type 2 diabetes mellitus Hemoglobin A1c 7.1 10/29/2019. Hx of hypoglycemic episodes with insulin sliding scale --monitor CBGs before every meal/at bedtime --Carbohydrate modified diet  Anemia of chronic disease:  Hemoglobin 11.2 which appears improved from previous.  --Continue iron supplementation  Hypocalcemia: Acute on chronic.   On admission calcium level noted to be 7.6.  8.2 this morning --Continue calcium carbonate  Vertigo: Chronic.   Patient had been started on meclizine TID with some improvement in symptoms.  --Continue scheduled meclizine  DVT prophylaxis: Heparin Code Status: Full code Family Communication: No family present at bedside this morning  Disposition Plan:  Status is: Inpatient  Remains inpatient appropriate because:Ongoing diagnostic testing needed not appropriate for outpatient work up, Unsafe d/c plan, IV treatments appropriate due to intensity of illness or inability to take PO and Inpatient level of care appropriate due to severity of illness   Dispo: The patient is from: Home              Anticipated d/c is to: Home              Anticipated d/c date is: 2 days              Patient currently is not medically stable to d/c.   Consultants:   Cardiology  Procedures:   Nuclear med cardiac amyloid  Antimicrobials:   None   Subjective: Patient seen and examined at bedside, resting comfortably.  Continues with intermittent chest discomfort to the right side that is reproducible on palpation.  Spouse present this morning.  Discussed with patient and spouse about fluid collection surrounding right lung and agree to  attempt thoracentesis.  Continues on supplemental oxygen, now titrated down to 0.5 L nasal cannula. Denies headache, no visual changes, no fever/chills/night sweats, no nausea/vomiting/diarrhea, no palpitations, no abdominal pain, no weakness, no fatigue, no paresthesias.  No acute events overnight per nursing staff.  Objective: Vitals:   12/26/19 0021 12/26/19 0550 12/26/19 0831 12/26/19 1200  BP: (!) 147/78 (!) 141/77  132/71  Pulse: 69 74  64  Resp: 16 16  16   Temp: 98.4 F (36.9 C) 98.7 F (37.1 C)  97.7 F (36.5 C)  TempSrc: Oral Oral  Oral  SpO2: 97% 94% 92% 95%  Weight:      Height:        Intake/Output Summary (Last 24 hours) at 12/26/2019 1223 Last data filed at 12/25/2019 2000 Gross per 24 hour  Intake --  Output 80 ml  Net -80 ml   Filed Weights   12/23/19 1511 12/24/19 0621 12/25/19 0500  Weight: 95.5 kg 94.9 kg 94.1 kg    Examination:  General exam: Appears calm and comfortable  Respiratory system: Clear to auscultation. Respiratory effort normal.  On 0.5 L nasal cannula with SPO2 96%, not on home oxygen Cardiovascular system: S1 & S2 heard, RRR. No JVD, murmurs, rubs, gallops or clicks. No pedal edema.  Reproducible right-sided chest wall pain with palpation Gastrointestinal system: Abdomen is nondistended,  soft and nontender. No organomegaly or masses felt. Normal bowel sounds heard. Central nervous system: Alert and oriented. No focal neurological deficits. Extremities: Symmetric 5 x 5 power. Skin: No rashes, lesions or ulcers Psychiatry: Judgement and insight appear normal. Mood & affect appropriate.     Data Reviewed: I have personally reviewed following labs and imaging studies  CBC: Recent Labs  Lab 12/23/19 0636  WBC 8.3  NEUTROABS 5.4  HGB 11.2*  HCT 37.5*  MCV 90.6  PLT 580   Basic Metabolic Panel: Recent Labs  Lab 12/23/19 0636 12/24/19 0320 12/25/19 0456 12/26/19 0829  NA 140 143 141 141  K 4.5 4.7 3.8 4.2  CL 110 108 102 99   CO2 21* 25 27 27   GLUCOSE 147* 102* 118* 170*  BUN 42* 42* 39* 39*  CREATININE 2.96* 2.91* 2.92* 3.16*  CALCIUM 7.6* 7.9* 8.0* 8.2*   GFR: Estimated Creatinine Clearance: 25 mL/min (A) (by C-G formula based on SCr of 3.16 mg/dL (H)). Liver Function Tests: Recent Labs  Lab 12/23/19 0636  AST 21  ALT 24  ALKPHOS 98  BILITOT 0.9  PROT 7.5  ALBUMIN 3.5   No results for input(s): LIPASE, AMYLASE in the last 168 hours. No results for input(s): AMMONIA in the last 168 hours. Coagulation Profile: No results for input(s): INR, PROTIME in the last 168 hours. Cardiac Enzymes: No results for input(s): CKTOTAL, CKMB, CKMBINDEX, TROPONINI in the last 168 hours. BNP (last 3 results) No results for input(s): PROBNP in the last 8760 hours. HbA1C: No results for input(s): HGBA1C in the last 72 hours. CBG: Recent Labs  Lab 12/25/19 1122 12/25/19 1605 12/26/19 0033 12/26/19 0506 12/26/19 1110  GLUCAP 118* 108* 197* 154* 158*   Lipid Profile: No results for input(s): CHOL, HDL, LDLCALC, TRIG, CHOLHDL, LDLDIRECT in the last 72 hours. Thyroid Function Tests: No results for input(s): TSH, T4TOTAL, FREET4, T3FREE, THYROIDAB in the last 72 hours. Anemia Panel: No results for input(s): VITAMINB12, FOLATE, FERRITIN, TIBC, IRON, RETICCTPCT in the last 72 hours. Sepsis Labs: No results for input(s): PROCALCITON, LATICACIDVEN in the last 168 hours.  Recent Results (from the past 240 hour(s))  SARS Coronavirus 2 by RT PCR (hospital order, performed in [facility] hospital lab) Nasopharyngeal Nasopharyngeal Swab     Status: None   Collection Time: 12/23/19 10:59 AM   Specimen: Nasopharyngeal Swab  Result Value Ref Range Status   SARS Coronavirus 2 NEGATIVE NEGATIVE Final    Comment: (NOTE) SARS-CoV-2 target nucleic acids are NOT DETECTED.  The SARS-CoV-2 RNA is generally detectable in upper and lower respiratory specimens during the acute phase of infection. The lowest concentration of  SARS-CoV-2 viral copies this assay can detect is 250 copies / mL. A negative result does not preclude SARS-CoV-2 infection and should not be used as the sole basis for treatment or other patient management decisions.  A negative result may occur with improper specimen collection / handling, submission of specimen other than nasopharyngeal swab, presence of viral mutation(s) within the areas targeted by this assay, and inadequate number of viral copies (<250 copies / mL). A negative result must be combined with clinical observations, patient history, and epidemiological information.  Fact Sheet for Patients:   StrictlyIdeas.no  Fact Sheet for Healthcare Providers: BankingDealers.co.za  This test is not yet approved or  cleared by the [place] FDA and has been authorized for detection and/or diagnosis of SARS-CoV-2 by FDA under an Emergency Use Authorization (EUA).  This EUA will remain in effect (  meaning this test can be used) for the duration of the COVID-19 declaration under Section 564(b)(1) of the Act, 21 U.S.C. section 360bbb-3(b)(1), unless the authorization is terminated or revoked sooner.  Performed at [facility], [place] [address], [place], [place] [address]          Radiology Studies: US RENAL  Result Date: 12/25/2019 CLINICAL DATA:  69 year old male with stage 4 chronic kidney disease EXAM: RENAL / URINARY TRACT ULTRASOUND COMPLETE COMPARISON:  None. FINDINGS: Right Kidney: Renal measurements: 9.2 x 4.6 x 5.2 cm = volume: 114 mL. Echogenic renal parenchyma with increased conspicuity between the cortex and medullary interface. No evidence of hydronephrosis or nephrolithiasis. No cystic or solid masses. Left Kidney: Renal measurements: 10.3 x 4.8 x 4.8 cm = volume: 123 mL. She denies any prior health problems or medication use. She started feeling short of breath about 3 days prior to admission. Associated with chest  pressure in mid chest and nausea. Came to ER and CT angio showed PE. She was started on heparin gtt and supplemental oxygen. BP 176/104, HR 104, SpO2 96% on 2 liters oxygen during my examination of her. She reports improvement in dyspnea and chest pain. Denies syncopal episodes. Bladder: Appears normal for degree of bladder distention. Good visualization of both ureteral jets. Other: Moderate right pleural effusion. IMPRESSION: 1. No evidence of hydronephrosis. 2. Echogenic renal parenchyma bilaterally consistent with medical renal disease. 3. Moderate right pleural effusion. Electronically Signed   By: Jacqulynn Cadet M.D.   On: 12/25/2019 12:40        Scheduled Meds:  amLODipine  10 mg Oral Daily   aspirin EC  81 mg Oral Daily   calcitRIOL  0.25 mcg Oral Once per day on Mon Wed Fri   calcium carbonate  2 tablet Oral Daily   carvedilol  12.5 mg Oral BID WC   ferrous sulfate  325 mg Oral Q breakfast   furosemide  80 mg Intravenous BID   gabapentin  300 mg Oral BID   heparin  5,000 Units Subcutaneous Q8H   hydrALAZINE  50 mg Oral TID   isosorbide mononitrate  60 mg Oral QPM   meclizine  25 mg Oral TID   metolazone  5 mg Oral Daily   sodium chloride flush  3 mL Intravenous Q12H   Continuous Infusions:  sodium chloride       LOS: 3 days    Time spent: 38 minutes spent on chart review, discussion with nursing staff, consultants, updating family and interview/physical exam; more than 50% of that time was spent in counseling and/or coordination of care.    Mara Favero J [place] DO [facility] Available via Epic secure chat 7am-7pm After these hours, please refer to coverage provider listed on [email or web address] 12/26/2019, 12:23 PM

## 2019-12-26 NOTE — Progress Notes (Signed)
Progress Note  Patient Name: Melvin Mcclure Date of Encounter: 12/26/2019  [facility] Cardiologist: Kirk Ruths, MD   Subjective   Patient had little urine output overnight however overall fluid status improving. Still have reproducible chest pain on exam, likely from straining himself.   Inpatient Medications    Scheduled Meds: . amLODipine  10 mg Oral Daily  . aspirin EC  81 mg Oral Daily  . calcitRIOL  0.25 mcg Oral Once per day on Mon Wed Fri  . calcium carbonate  2 tablet Oral Daily  . carvedilol  12.5 mg Oral BID WC  . ferrous sulfate  325 mg Oral Q breakfast  . furosemide  80 mg Intravenous BID  . gabapentin  300 mg Oral BID  . heparin  5,000 Units Subcutaneous Q8H  . hydrALAZINE  50 mg Oral TID  . isosorbide mononitrate  60 mg Oral QPM  . meclizine  25 mg Oral TID  . metolazone  5 mg Oral Daily  . sodium chloride flush  3 mL Intravenous Q12H   Continuous Infusions: . sodium chloride     PRN Meds: sodium chloride, acetaminophen, nitroGLYCERIN, ondansetron (ZOFRAN) IV, senna-docusate, sodium chloride flush, traMADol-acetaminophen   Vital Signs    Vitals:   12/25/19 2022 12/26/19 0021 12/26/19 0550 12/26/19 0831  BP: 136/78 (!) 147/78 (!) 141/77   Pulse: 65 69 74   Resp: 16 16 16    Temp: 98.3 F (36.8 C) 98.4 F (36.9 C) 98.7 F (37.1 C)   TempSrc: Oral Oral Oral   SpO2: 95% 97% 94% 92%  Weight:      Height:        Intake/Output Summary (Last 24 hours) at 12/26/2019 0844 Last data filed at 12/25/2019 2000 Gross per 24 hour  Intake --  Output 80 ml  Net -80 ml   Last 3 Weights 12/25/2019 12/24/2019 12/23/2019  Weight (lbs) 207 lb 7.3 oz 209 lb 3.5 oz 210 lb 8.6 oz  Weight (kg) 94.1 kg 94.9 kg 95.5 kg      Telemetry    NSR, brief 1st degree AV block, HR 70s - Personally Reviewed  ECG     NSR, 66 bpm, RBBB, TWI anteroseptal leads - Personally Reviewed  Physical Exam   GEN: No acute distress.   Neck: No JVD Cardiac: RRR, no murmurs,  rubs, or gallops.  Respiratory: diminished at bases GI: Soft, nontender, non-distended  MS: minimal edema; No deformity. Neuro:  Nonfocal  Psych: Normal affect   Labs    High Sensitivity Troponin:   Recent Labs  Lab 12/23/19 0636 12/23/19 1059 12/25/19 0456 12/25/19 0739  TROPONINIHS 10 11 13 12       Chemistry Recent Labs  Lab 12/23/19 0636 12/24/19 0320 12/25/19 0456  NA 140 143 141  K 4.5 4.7 3.8  CL 110 108 102  CO2 21* 25 27  GLUCOSE 147* 102* 118*  BUN 42* 42* 39*  CREATININE 2.96* 2.91* 2.92*  CALCIUM 7.6* 7.9* 8.0*  PROT 7.5  --   --   ALBUMIN 3.5  --   --   AST 21  --   --   ALT 24  --   --   ALKPHOS 98  --   --   BILITOT 0.9  --   --   GFRNONAA 20* 21* 21*  GFRAA 24* 24* 24*  ANIONGAP 9 10 12      Hematology Recent Labs  Lab 12/23/19 0636  WBC 8.3  RBC 4.14*  HGB 11.2*  HCT 37.5*  MCV 90.6  MCH 27.1  MCHC 29.9*  RDW 17.4*  PLT 277    BNP Recent Labs  Lab 12/23/19 0636  BNP 832.6*     DDimer No results for input(s): DDIMER in the last 168 hours.   Radiology    US RENAL  Result Date: 12/25/2019 CLINICAL DATA:  69 year old male with stage 4 chronic kidney disease EXAM: RENAL / URINARY TRACT ULTRASOUND COMPLETE COMPARISON:  None. FINDINGS: Right Kidney: Renal measurements: 9.2 x 4.6 x 5.2 cm = volume: 114 mL. Echogenic renal parenchyma with increased conspicuity between the cortex and medullary interface. No evidence of hydronephrosis or nephrolithiasis. No cystic or solid masses. Left Kidney: Renal measurements: 10.3 x 4.8 x 4.8 cm = volume: 123 mL. She denies any prior health problems or medication use. She started feeling short of breath about 3 days prior to admission. Associated with chest pressure in mid chest and nausea. Came to ER and CT angio showed PE. She was started on heparin gtt and supplemental oxygen. BP 176/104, HR 104, SpO2 96% on 2 liters oxygen during my examination of her. She reports improvement in dyspnea and chest  pain. Denies syncopal episodes. Bladder: Appears normal for degree of bladder distention. Good visualization of both ureteral jets. Other: Moderate right pleural effusion. IMPRESSION: 1. No evidence of hydronephrosis. 2. Echogenic renal parenchyma bilaterally consistent with medical renal disease. 3. Moderate right pleural effusion. Electronically Signed   By: Jacqulynn Cadet M.D.   On: 12/25/2019 12:40    Cardiac Studies   10/29/19  1. Left ventricular ejection fraction, by estimation, is 55 to 60%. The  left ventricle has normal function. The left ventricle has no regional  wall motion abnormalities. There is moderate left ventricular hypertrophy.  Left ventricular diastolic  parameters are indeterminate.  2. Right ventricular systolic function is normal. The right ventricular  size is normal. Tricuspid regurgitation signal is inadequate for assessing  PA pressure.  3. The mitral valve is grossly normal. Mild mitral valve regurgitation.  4. The aortic valve is tricuspid, mildly calcified. Aortic valve  regurgitation is not visualized.  5. The inferior vena cava is normal in size with greater than 50%  respiratory variability, suggesting right atrial pressure of 3 mmHg.  6. Trivial to small circumferential pericardial effusion.   Patient Profile     69 y.o. male 69 y.o. male with a hx of chronic diastolic CHF, hypertensionwho is being seen today for the evaluation of acute on chronic diastolic CHF.   Assessment & Plan   Acute on chronic CHF - Echo 10/2019 LVEF 55-60%, no regional wall motion, mod LVH, mild MR - BNP 832. CXR ordered - IV lasix 80 mg BID - Urine output overnight -51ml, net -2.1L - weights down 2lbs since yesterday - Plan for OP work-up for possible cardiac amyloidosis with PYP scan - Appears relatively euvolemic on exam. Can likely switch to PO lasix  Atypical chest pain - HS troponin negative - No LHC or cardiac CT for kidney insufficiency - still  having reproducible chest pain on exam - will make npo for possible stress test tomorrow  HTN - metoprolol changed to coreg 12.5mg  BID - Imdur increased to 60 mg daily - amlodipine 10 mg daily - Hydralazine 50 mg TID - Renal US - pressures reasonable   For questions or updates, please contact [facility] Please consult [email or web address] for contact info under        Signed, Amando Chaput Ninfa Meeker, PA-C  12/26/2019, 8:44  AM

## 2019-12-26 NOTE — Evaluation (Signed)
Physical Therapy Evaluation Patient Details Name: Melvin Mcclure MRN: [id number] DOB: 25-Jul-1950 Today's Date: 12/26/2019   History of Present Illness  The pt is a 69 yo male presenting from home with worsening SOB. Following workup, pt found to have acute exacerbation of diastolic CHF. PMH includes: recent hospitalization for CHF exacerbation and CAP (6/25 - 7/4), HTN, DM II, CKD IV, and vertigo.  Clinical Impression  Pt in bed upon arrival of PT, agreeable to evaluation at this time. Prior to admission the pt was independent with all mobility without use of AD or O2 at home. The pt now presents with limitations in functional mobility, endurance, dynamic stability, and increased need for O2 due to above dx, and will continue to benefit from skilled PT to address these deficits. The pt was able to demo good transfers and hallway ambulation without assist or AD, and completed a total of 15 steps with use of alternating pattern and R rail only while maintaining SpO2 92-94% on 1L O2. The pt will continue to benefit from skilled PT acutely to further progress functional stability, endurance, attempt to wean O2, but he will be safe to d/c home with wife when medically cleared.      Follow Up Recommendations No PT follow up;Supervision for mobility/OOB    Equipment Recommendations  None recommended by PT (O2, but pt does not want to go home with O2)    Recommendations for Other Services       Precautions / Restrictions Precautions Precautions: Fall Precaution Comments: low fall, watch SpO2 Restrictions Weight Bearing Restrictions: No      Mobility  Bed Mobility Overal bed mobility: Independent                Transfers Overall transfer level: Needs assistance   Transfers: Sit to/from Stand Sit to Stand: Supervision         General transfer comment: supervision for safety, SpO2 85-89% on RA while completing sit-stand to don pants  Ambulation/Gait Ambulation/Gait assistance:  Supervision Gait Distance (Feet): 150 Feet Assistive device: None Gait Pattern/deviations: Step-through pattern     General Gait Details: Pt with a few instances of stubbing feet on the floor which required 2-3 steps to recover stability but was able to do so without UE support or assist. Pt able to increase step clearance to reduce instances without cues  Stairs Stairs: Yes Stairs assistance: Supervision Stair Management: One rail Right;Forwards;Alternating pattern Number of Stairs: 1 (x5; 2X5) General stair comments: pt initially completed one step only, x5. then 2 at a time. limited due to cord from O2. SpO2 remained 92-94% on 1L  Wheelchair Mobility    Modified Rankin (Stroke Patients Only)       Balance Overall balance assessment: Mild deficits observed, not formally tested                                           Pertinent Vitals/Pain Pain Assessment: 0-10 Faces Pain Scale: Hurts a little bit Pain Location: chest Pain Descriptors / Indicators: Sore Pain Intervention(s): Monitored during session    Home Living Family/patient expects to be discharged to:: Private residence Living Arrangements: Spouse/significant other Available Help at Discharge: Available 24 hours/day Type of Home: House Home Access: Level entry     Home Layout: Two level Home Equipment: None      Prior Function Level of Independence: Independent  Comments: only requires assist when having vertigo, still driving and able to run errands     Hand Dominance   Dominant Hand: Right    Extremity/Trunk Assessment   Upper Extremity Assessment Upper Extremity Assessment: Overall WFL for tasks assessed    Lower Extremity Assessment Lower Extremity Assessment: Overall WFL for tasks assessed    Cervical / Trunk Assessment Cervical / Trunk Assessment: Normal  Communication   Communication: No difficulties  Cognition Arousal/Alertness: Awake/alert Behavior  During Therapy: WFL for tasks assessed/performed Overall Cognitive Status: Impaired/Different from baseline Area of Impairment: Memory;Safety/judgement;Problem solving                     Memory: Decreased short-term memory   Safety/Judgement: Decreased awareness of safety   Problem Solving: Slow processing General Comments: Pt requring repeated answers to the same question multiple times through session, poor safety awareness and planning, requiring cues for safety      General Comments      Exercises     Assessment/Plan    PT Assessment Patient needs continued PT services  PT Problem List Decreased mobility;Decreased safety awareness;Decreased activity tolerance;Cardiopulmonary status limiting activity;Decreased cognition;Decreased balance       PT Treatment Interventions Therapeutic exercise;Gait training;Balance training;Stair training;Functional mobility training;Therapeutic activities;Patient/family education    PT Goals (Current goals can be found in the Care Plan section)  Acute Rehab PT Goals Patient Stated Goal: return home without O2 PT Goal Formulation: With patient Time For Goal Achievement: 01/09/20 Potential to Achieve Goals: Good    Frequency Min 3X/week    AM-PAC PT "6 Clicks" Mobility  Outcome Measure Help needed turning from your back to your side while in a flat bed without using bedrails?: None Help needed moving from lying on your back to sitting on the side of a flat bed without using bedrails?: None Help needed moving to and from a bed to a chair (including a wheelchair)?: None Help needed standing up from a chair using your arms (e.g., wheelchair or bedside chair)?: None Help needed to walk in hospital room?: A Little Help needed climbing 3-5 steps with a railing? : A Little 6 Click Score: 22    End of Session Equipment Utilized During Treatment: Oxygen (1L O2) Activity Tolerance: Patient tolerated treatment well Patient left: in bed  (lab present to draw. pt sitting EOB) Nurse Communication: Mobility status PT Visit Diagnosis: Difficulty in walking, not elsewhere classified (R26.2)    Time: 0981-1914 PT Time Calculation (min) (ACUTE ONLY): 20 min   Charges:   PT Evaluation $PT Eval Moderate Complexity: 1 Mod          Karma Ganja, PT, DPT   Acute Rehabilitation Department Pager #: [phone]   Otho Bellows 12/26/2019, 8:49 AM

## 2019-12-26 NOTE — Procedures (Signed)
PROCEDURE SUMMARY:  Successful US guided right diagnostic and therapeutic thoracentesis. Yielded 700 mL of amber fluid. Pt tolerated procedure well. No immediate complications.  Specimen was sent for labs. CXR ordered.  EBL < 5 mL  Docia Barrier PA-C 12/26/2019 3:45 PM

## 2019-12-27 ENCOUNTER — Encounter (HOSPITAL_COMMUNITY): Payer: Self-pay | Admitting: Internal Medicine

## 2019-12-27 ENCOUNTER — Inpatient Hospital Stay (HOSPITAL_COMMUNITY): Payer: Medicare Other

## 2019-12-27 DIAGNOSIS — I10 Essential (primary) hypertension: Secondary | ICD-10-CM

## 2019-12-27 LAB — BASIC METABOLIC PANEL
Anion gap: 10 (ref 5–15)
BUN: 44 mg/dL — ABNORMAL HIGH (ref 8–23)
CO2: 31 mmol/L (ref 22–32)
Calcium: 7.7 mg/dL — ABNORMAL LOW (ref 8.9–10.3)
Chloride: 98 mmol/L (ref 98–111)
Creatinine, Ser: 3.47 mg/dL — ABNORMAL HIGH (ref 0.61–1.24)
GFR calc Af Amer: 20 mL/min — ABNORMAL LOW (ref >60)
GFR calc non Af Amer: 17 mL/min — ABNORMAL LOW (ref >60)
Glucose, Bld: 121 mg/dL — ABNORMAL HIGH (ref 70–99)
Potassium: 4 mmol/L (ref 3.5–5.1)
Sodium: 139 mmol/L (ref 135–145)

## 2019-12-27 LAB — GLUCOSE, CAPILLARY
Glucose-Capillary: 120 mg/dL — ABNORMAL HIGH (ref 70–99)
Glucose-Capillary: 203 mg/dL — ABNORMAL HIGH (ref 70–99)
Glucose-Capillary: 213 mg/dL — ABNORMAL HIGH (ref 70–99)
Glucose-Capillary: 198 mg/dL — ABNORMAL HIGH (ref 70–99)

## 2019-12-27 NOTE — TOC Initial Note (Signed)
Transition of Care [facility]) - Initial/Assessment Note    Patient Details  Name: Melvin Mcclure MRN: [id number] Date of Birth: 1950/12/13  [facility]) CM/SW Contact:    Marilu Favre, RN Phone Number: 12/27/2019, 2:34 PM  Clinical Narrative:                  Patient from home with wife. Spoke to both at bedside. Discussed PT recommendations with MD. Verbal consult for OP PT. Explained to patient and wife. Entered order. Neurorehab will call to arrange an appointment.  Also discuss home oxygen. MD will ask nursing staff tomorrow morning to do oxygenation saturation ambulation note. If patient needs oxygen at discharge, NCM explained [place] will bring portable tank to patient's room prior to discharge.   Patient requesting a tank that he can put over his shoulder. NCM called Zach with [facility]. Zach asking for home oxygen order to include POC evaluation. Added same to order.   Expected Discharge Plan: Home/Self Care Barriers to Discharge: Continued Medical Work up   Patient Goals and CMS Choice Patient states their goals for this hospitalization and ongoing recovery are:: to go home CMS Medicare.gov Compare Post Acute Care list provided to:: Patient Choice offered to / list presented to : Patient  Expected Discharge Plan and Services Expected Discharge Plan: Home/Self Care   Discharge Planning Services: CM Consult Post Acute Care Choice: Durable Medical Equipment Living arrangements for the past 2 months: Single Family Home                 DME Arranged: Oxygen DME Agency: [employer] Date DME Agency Contacted: 12/27/19 Time DME Agency Contacted: [phone] Representative spoke with at DME Agency: [place]: NA          Prior Living Arrangements/Services Living arrangements for the past 2 months: [place] Lives with:: Spouse Patient language and need for interpreter reviewed:: Yes        Need for Family Participation in Patient Care:  Yes (Comment) Care giver support system in place?: Yes (comment)   Criminal Activity/Legal Involvement Pertinent to Current Situation/Hospitalization: No - Comment as needed  Activities of Daily Living      Permission Sought/Granted   Permission granted to share information with : Yes, Verbal Permission Granted  Share Information with NAME: Melvin Mcclure wife           Emotional Assessment Appearance:: Appears stated age Attitude/Demeanor/Rapport: Engaged Affect (typically observed): Accepting Orientation: : Oriented to Self, Oriented to Place, Oriented to  Time, Oriented to Situation Alcohol / Substance Use: Not Applicable Psych Involvement: No (comment)  Admission diagnosis:  Acute exacerbation of CHF (congestive heart failure) (HCC) [I50.9] Acute respiratory failure with hypoxia (HCC) [J96.01] Acute on chronic congestive heart failure, unspecified heart failure type [facility]) [I50.9] Patient Active Problem List   Diagnosis Date Noted  . Acute exacerbation of CHF (congestive heart failure) ([place]) 12/23/2019  . Hypocalcemia 12/23/2019  . Anemia of chronic disease 12/23/2019  . Vertigo 12/23/2019  . Acute on chronic respiratory failure with hypoxia ([place]) 12/23/2019  . CAP (community acquired pneumonia) 10/28/2019  . Hypertensive cardiomyopathy, with heart failure ([place]) 06/15/2018  . Acute diastolic CHF (congestive heart failure) ([place]) 05/28/2018  . Type 2 diabetes mellitus with stage 3 chronic kidney disease ([place]) 05/28/2018  . CKD stage 4 due to type 2 diabetes mellitus ([place]) 05/28/2018  . CHF (congestive heart failure) ([place]) 05/28/2018   PCP:  Nolene Ebbs, MD Pharmacy:   [facility]  [place] ([place]), [place] - [address] [place] ([place]) [place] [address] Phone: [phone] Fax: [phone]     Social Determinants of Health (SDOH) Interventions    Readmission Risk Interventions No flowsheet data found.

## 2019-12-27 NOTE — Progress Notes (Addendum)
Progress Note  Patient Name: Melvin Mcclure Date of Encounter: 12/27/2019  [facility] Cardiologist: Kirk Ruths, MD   Subjective   Creatinine rising, will hold lasix. Cardiac amyloid imaging negative. S/p thoracentesis 780mL pleural fluid. Pt feels breathing is better however still on supplemental O2. Has chest pain on palpation.   Inpatient Medications    Scheduled Meds:  amLODipine  10 mg Oral Daily   aspirin EC  81 mg Oral Daily   calcitRIOL  0.25 mcg Oral Once per day on Mon Wed Fri   calcium carbonate  2 tablet Oral Daily   carvedilol  12.5 mg Oral BID WC   ferrous sulfate  325 mg Oral Q breakfast   gabapentin  300 mg Oral BID   heparin  5,000 Units Subcutaneous Q8H   hydrALAZINE  50 mg Oral TID   isosorbide mononitrate  60 mg Oral QPM   meclizine  25 mg Oral TID   metolazone  5 mg Oral Daily   sodium chloride flush  3 mL Intravenous Q12H   Continuous Infusions:  sodium chloride     PRN Meds: sodium chloride, acetaminophen, lidocaine, nitroGLYCERIN, ondansetron (ZOFRAN) IV, senna-docusate, sodium chloride flush, traMADol-acetaminophen   Vital Signs    Vitals:   12/26/19 1821 12/26/19 2035 12/27/19 0018 12/27/19 0617  BP: 129/68 133/72 115/72 121/72  Pulse: 65 69 73 75  Resp: 16  16   Temp: 97.7 F (36.5 C)  98.7 F (37.1 C)   TempSrc: Oral  Oral   SpO2: 95%  96% 94%  Weight:      Height:        Intake/Output Summary (Last 24 hours) at 12/27/2019 0730 Last data filed at 12/27/2019 0330 Gross per 24 hour  Intake 240 ml  Output 850 ml  Net -610 ml   Last 3 Weights 12/25/2019 12/24/2019 12/23/2019  Weight (lbs) 207 lb 7.3 oz 209 lb 3.5 oz 210 lb 8.6 oz  Weight (kg) 94.1 kg 94.9 kg 95.5 kg      Telemetry    NSr, HR 70-80, 3 beats ?SVT - Personally Reviewed  ECG    No new - Personally Reviewed  Physical Exam   GEN: No acute distress.   Neck: No JVD Cardiac: RRR, no murmurs, rubs, or gallops.  Respiratory: crackles R base GI: Soft,  nontender, non-distended  MS: No edema; No deformity. Neuro:  Nonfocal  Psych: Normal affect   Labs    High Sensitivity Troponin:   Recent Labs  Lab 12/23/19 0636 12/23/19 1059 12/25/19 0456 12/25/19 0739  TROPONINIHS 10 11 13 12       Chemistry Recent Labs  Lab 12/23/19 0636 12/24/19 0320 12/25/19 0456 12/26/19 0829 12/26/19 1940 12/27/19 0539  NA 140   < > 141 141  --  139  K 4.5   < > 3.8 4.2  --  4.0  CL 110   < > 102 99  --  98  CO2 21*   < > 27 27  --  31  GLUCOSE 147*   < > 118* 170*  --  121*  BUN 42*   < > 39* 39*  --  44*  CREATININE 2.96*   < > 2.92* 3.16*  --  3.47*  CALCIUM 7.6*   < > 8.0* 8.2*  --  7.7*  PROT 7.5  --   --   --  7.7  --   ALBUMIN 3.5  --   --   --   --   --  AST 21  --   --   --   --   --   ALT 24  --   --   --   --   --   ALKPHOS 98  --   --   --   --   --   BILITOT 0.9  --   --   --   --   --   GFRNONAA 20*   < > 21* 19*  --  17*  GFRAA 24*   < > 24* 22*  --  20*  ANIONGAP 9   < > 12 15  --  10   < > = values in this interval not displayed.     Hematology Recent Labs  Lab 12/23/19 0636  WBC 8.3  RBC 4.14*  HGB 11.2*  HCT 37.5*  MCV 90.6  MCH 27.1  MCHC 29.9*  RDW 17.4*  PLT 277    BNP Recent Labs  Lab 12/23/19 0636  BNP 832.6*     DDimer No results for input(s): DDIMER in the last 168 hours.   Radiology    DG Chest 1 View  Result Date: 12/26/2019 CLINICAL DATA:  Status post right-sided thoracentesis EXAM: CHEST  1 VIEW COMPARISON:  12/23/2019 FINDINGS: Interval decrease in right-sided pleural effusion status post thoracentesis. Mild hazy right basilar opacity, significantly improved from prior. Left lung remains clear. No pneumothorax. Stable heart size. IMPRESSION: 1. Decreased right-sided pleural effusion status post thoracentesis. No pneumothorax. 2. Improved aeration of the right lung base. Electronically Signed   By: Davina Poke D.O.   On: 12/26/2019 16:14   NM CARDIAC AMYLOID TUMOR LOC INFLAM SPECT  1 DAY  Result Date: 12/26/2019 CLINICAL DATA:  HEART FAILURE. CONCERN FOR CARDIAC AMYLOIDOSIS. EXAM: NUCLEAR MEDICINE TUMOR LOCALIZATION. PYP CARDIAC AMYLOIDOSIS SCAN WITH SPECT TECHNIQUE: Following intravenous administration of radiopharmaceutical, anterior planar images of the chest were obtained. Regions of interest were placed on the heart and contralateral chest wall for quantitative assessment. Additional SPECT imaging of the chest was obtained. RADIOPHARMACEUTICALS:  8 mCi TECHNETIUM 99 PYROPHOSPHATE FINDINGS: Planar Visual assessment: Anterior planar imaging demonstrates radiotracer uptake within the heart less than uptake within the adjacent ribs (Grade 1). Quantitative assessment : Quantitative assessment of the cardiac uptake compared to the contralateral chest wall is equal to 1.1 (H/CL = 1.1). SPECT assessment: SPECT imaging of the chest demonstrates minimal radiotracer accumulation within the LEFT ventricle. IMPRESSION: Visual and quantitative assessment (grade 1, H/CLL equal 1.1) are NOT suggestive of transthyretin amyloidosis. Electronically Signed   By: Suzy Bouchard M.D.   On: 12/26/2019 16:57   US RENAL  Result Date: 12/25/2019 CLINICAL DATA:  69 year old male with stage 4 chronic kidney disease EXAM: RENAL / URINARY TRACT ULTRASOUND COMPLETE COMPARISON:  None. FINDINGS: Right Kidney: Renal measurements: 9.2 x 4.6 x 5.2 cm = volume: 114 mL. Echogenic renal parenchyma with increased conspicuity between the cortex and medullary interface. No evidence of hydronephrosis or nephrolithiasis. No cystic or solid masses. Left Kidney: Renal measurements: 10.3 x 4.8 x 4.8 cm = volume: 123 mL. She denies any prior health problems or medication use. She started feeling short of breath about 3 days prior to admission. Associated with chest pressure in mid chest and nausea. Came to ER and CT angio showed PE. She was started on heparin gtt and supplemental oxygen. BP 176/104, HR 104, SpO2 96% on 2 liters  oxygen during my examination of her. She reports improvement in dyspnea and chest pain. Denies syncopal  episodes. Bladder: Appears normal for degree of bladder distention. Good visualization of both ureteral jets. Other: Moderate right pleural effusion. IMPRESSION: 1. No evidence of hydronephrosis. 2. Echogenic renal parenchyma bilaterally consistent with medical renal disease. 3. Moderate right pleural effusion. Electronically Signed   By: Jacqulynn Cadet M.D.   On: 12/25/2019 12:40   IR THORACENTESIS ASP PLEURAL SPACE W/IMG GUIDE  Result Date: 12/26/2019 INDICATION: Patient with history of CHF, now with right pleural effusion. Request is made for diagnostic and therapeutic thoracentesis. EXAM: ULTRASOUND GUIDED DIAGNOSTIC AND THERAPEUTIC RIGHT THORACENTESIS MEDICATIONS: 10 mL 1% lidocaine COMPLICATIONS: None immediate. PROCEDURE: An ultrasound guided thoracentesis was thoroughly discussed with the patient and questions answered. The benefits, risks, alternatives and complications were also discussed. The patient understands and wishes to proceed with the procedure. Written consent was obtained. Ultrasound was performed to localize and mark an adequate pocket of fluid in the right chest. The area was then prepped and draped in the normal sterile fashion. 1% Lidocaine was used for local anesthesia. Under ultrasound guidance a 6 Fr Safe-T-Centesis catheter was introduced. Thoracentesis was performed. The catheter was removed and a dressing applied. FINDINGS: A total of approximately 700 mL of amber fluid was removed. Samples were sent to the laboratory as requested by the clinical team. IMPRESSION: Successful ultrasound guided diagnostic and therapeutic right thoracentesis yielding 700 mL of pleural fluid. Read by: Brynda Greathouse PA-C No pneumothorax on follow-up radiograph. Electronically Signed   By: Lucrezia Europe M.D.   On: 12/26/2019 15:47    Cardiac Studies   10/29/19   1. Left ventricular ejection  fraction, by estimation, is 55 to 60%. The  left ventricle has normal function. The left ventricle has no regional  wall motion abnormalities. There is moderate left ventricular hypertrophy.  Left ventricular diastolic  parameters are indeterminate.   2. Right ventricular systolic function is normal. The right ventricular  size is normal. Tricuspid regurgitation signal is inadequate for assessing  PA pressure.   3. The mitral valve is grossly normal. Mild mitral valve regurgitation.   4. The aortic valve is tricuspid, mildly calcified. Aortic valve  regurgitation is not visualized.   5. The inferior vena cava is normal in size with greater than 50%  respiratory variability, suggesting right atrial pressure of 3 mmHg.   6. Trivial to small circumferential pericardial effusion.   Amyloid scan IMPRESSION: Visual and quantitative assessment (grade 1, H/CLL equal 1.1) are NOT suggestive of transthyretin amyloidosis  Patient Profile     69 y.o. male  with a hx of chronic diastolic CHF, hypertension who is being seen today for the evaluation of acute on chronic diastolic CHF.   Assessment & Plan    Acute on chronic CHF - Echo 10/2019 LVEF 55-60%, no regional wall motion, mod LVH, mild MR - BNP 832. CXR ordered - IV lasix 80 mg BID and metolazone 5 mg daily - Urine output overnight low -661ml, net -2.7L - weights 207lbs - s/p thoracentesis 734mL pleural fluid - cardiac amyloidosis with PYP scan negative - euvolemic on exam however still with supplemental O2, would try and wean. Creatinine rising this AM, will hold lasix.    Atypical chest pain - HS troponin negative - No LHC or cardiac CT for kidney insufficiency - still having reproducible chest pain on exam - plan for OP stress test post discharge   HTN - metoprolol changed to coreg 12.5mg  BID - Imdur increased to 60 mg daily - amlodipine 10 mg daily - Hydralazine 50  mg TID - Renal US ordered - pressures stable  For  questions or updates, please contact [place] Please consult [email or web address] for contact info under        Signed, Hasini Peachey Ninfa Meeker, PA-C  12/27/2019, 7:30 AM

## 2019-12-27 NOTE — Progress Notes (Signed)
Renal duplex  has been completed. Refer to [facility] under chart review to view preliminary results.   12/27/2019  11:17 AM Melvin Mcclure, Bonnye Fava

## 2019-12-27 NOTE — Progress Notes (Signed)
Physical Therapy Treatment Patient Details Name: Melvin Mcclure MRN: [id number] DOB: Jun 13, 1950 Today's Date: 12/27/2019    History of Present Illness The pt is a 69 yo male presenting from home with worsening SOB. Following workup, pt found to have acute exacerbation of diastolic CHF. PMH includes: recent hospitalization for CHF exacerbation and CAP (6/25 - 7/4), HTN, DM II, CKD IV, and vertigo.    PT Comments    Continuing work on functional mobility and activity tolerance;  Performed O2 qualifying walk, O2 sats decr to 85% with amb on Room Air -- indicative of the need for supplemental O2; Pt also reporting feeling weak in his legs and knees, and that he is slightly worried his knees may buckle -- provided closer guard; We discussed the need for supplemental O2 at this time, the need for continuing follow up after dc with his Primary Care Physician and Cardiology;   During session, he told me he has been dealing with vertigo for about 2 years, and he did experience some vertigo sitting EOB -- will consider having a colleague with more expertise in vestibular assessment and treatment see him next session; Worth considering Outpt PT follow up for LE weakness, closer look at gait, and also for potential vestibular follow up; He could also benefit from Cardiac Rehab Phase 2; He will discuss with his wife  Follow Up Recommendations  Outpatient PT;Other (comment) (consider Outpt PT versus Cardiopulm Rehab Phase 2)     Equipment Recommendations  Other (comment) (home O2)    Recommendations for Other Services       Precautions / Restrictions Precautions Precautions: Fall Precaution Comments: mild fall risk, watch SpO2 Restrictions Other Position/Activity Restrictions: strict monitor of I/Os    Mobility  Bed Mobility Overal bed mobility: Independent                Transfers Overall transfer level: Needs assistance Equipment used: None Transfers: Sit to/from Stand Sit to Stand:  Supervision         General transfer comment: Supervision for safety; cues to self-monitor for activity tolerance; sat EOB for a while before standing due to pt reporting dizziness (has been experiencing vertigo on and off for 2 years)  Ambulation/Gait Ambulation/Gait assistance: Min guard Gait Distance (Feet): 150 Feet Assistive device: None (and hallway rail) Gait Pattern/deviations: Step-through pattern     General Gait Details: Notably slow pace, and occasional foot clearance difficulty (seems similar to yesterday's session); He reports feeling weak, and that his knees might buckle, so provided closer guard this session; one standing rest break, leaning against the wall   Stairs             Wheelchair Mobility    Modified Rankin (Stroke Patients Only)       Balance Overall balance assessment: Mild deficits observed, not formally tested                                          Cognition Arousal/Alertness: Awake/alert Behavior During Therapy: WFL for tasks assessed/performed Overall Cognitive Status: Within Functional Limits for tasks assessed (for simple mobility tasks)                                 General Comments: Still noting repested questions -- can't help but wonder if also related to decr hearing  Exercises      General Comments General comments (skin integrity, edema, etc.): Decr O2 sats with amb on room air, see other PT note of this date      Pertinent Vitals/Pain Pain Assessment: No/denies pain Pain Intervention(s): Monitored during session    Home Living                      Prior Function            PT Goals (current goals can now be found in the care plan section) Acute Rehab PT Goals Patient Stated Goal: return home without O2 PT Goal Formulation: With patient Time For Goal Achievement: 01/09/20 Potential to Achieve Goals: Good Progress towards PT goals: Progressing toward goals     Frequency    Min 3X/week      PT Plan Discharge plan needs to be updated    Co-evaluation              AM-PAC PT "6 Clicks" Mobility   Outcome Measure  Help needed turning from your back to your side while in a flat bed without using bedrails?: None Help needed moving from lying on your back to sitting on the side of a flat bed without using bedrails?: None Help needed moving to and from a bed to a chair (including a wheelchair)?: None Help needed standing up from a chair using your arms (e.g., wheelchair or bedside chair)?: A Little Help needed to walk in hospital room?: A Little Help needed climbing 3-5 steps with a railing? : A Little 6 Click Score: 21    End of Session Equipment Utilized During Treatment: Oxygen Activity Tolerance: Patient tolerated treatment well Patient left: in bed;with call bell/phone within reach (sitting EOB) Nurse Communication: Mobility status PT Visit Diagnosis: Difficulty in walking, not elsewhere classified (R26.2)     Time: 2707-8675 PT Time Calculation (min) (ACUTE ONLY): 32 min  Charges:  $Gait Training: 23-37 mins                     Roney Marion, PT  Acute Rehabilitation Services Pager [phone] Office [place] 12/27/2019, 2:01 PM

## 2019-12-27 NOTE — Progress Notes (Signed)
PROGRESS NOTE    Melvin Mcclure  ASN:[id number] DOB: 23-Feb-1951 DOA: 12/23/2019 PCP: Nolene Ebbs, MD    Brief Narrative:  Melvin Mcclure is a 69 y.o. male with medical history significant of diastolic HF, hypertension, diabetes mellitus type 2, CKD stage IV, and vertigo, presents with complaints of shortness of breath this morning.  Patient had just recently been hospitalized from 6/25-7/4 for CHF exacerbation with community-acquired pneumonia.  Over the last week or so he would be getting more short of breath even walking short distances.  His wife notes that he was put back on oxygen due to his symptoms.  He had reportedly been intermittently wheezing and not sleeping well due to the shortness of breath.  Denied having any significant change in his weight, coughing, or worsening leg swelling.  His wife reported that O2 saturations were as low as the 70s this morning when she checked which prompted her to bring him to the hospital, but he also had appointment for today to follow-up with cardiology.  Patient had last been seen by his nephrologist Dr. Royce Macadamia on 8/4 where he was increased to torsemide 20 mg daily, increased Tums, and increased on calcitriol.  Her records that were available is creatinine appears to have been around 2.4.  Upon admission to the emergency department patient was seen to be afebrile, pulse 57-66, blood pressures 142/75-162/93, and O2 saturations currently maintained on 3 L of oxygen.  Labs significant for hemoglobin 11.2, BUN 42, creatinine 2.96, calcium 7.6, BNP 832.6, and high-sensitivity troponin x2.  Chest x-ray significant for signs of CHF with moderate right-sided pleural effusion.  Patient was given 80 mg of Lasix IV.  TRH called to admit   Assessment & Plan:   Principal Problem:   Acute exacerbation of CHF (congestive heart failure) (HCC) Active Problems:   CKD stage 4 due to type 2 diabetes mellitus (HCC)   Hypocalcemia   Anemia of chronic disease    Vertigo   Acute on chronic respiratory failure with hypoxia (HCC)   Acute hypoxic respiratory failure Acute on chronic diastolic congestive heart failure Diastolic congestive heart failure exacerbation: Acute on chronic.  On chest x-ray showing cardiomegaly with pulmonary vascular congestion and bilateral interstitial prominence suggestive of CHF with her right-sided pleural effusion.  BNP elevated at 832.6.  Initial O2 saturations noted to be around 84% on room improved with 3 L nasal cannula oxygen.  Last EF noted to be 55 to 60% by echocardiogram on 10/29/2019.  At his last discharge his dry weight was noted to be 87 kg, but he presents today at 95.5 kg.  Patient had been given 80 mg of Lasix IV in ED. --Cardiology following, appreciate assistance --net negative 625mL past 24h; net negative 2.7L past 24h --wt 95.5>94.9>94.1kg (dry 87kg) --Cardiology discontinued furosemide and metolazone today --Continue supplemental oxygen, maintain SPO2 greater than 92% currently on 1 L nasal cannula with SPO2 94% --Fluid restriction 1500 mL/day --Strict I's and O's and daily weights  Chest pain, atypical Patient complaining of right-sided chest pain, onset after significant coughing spells over the past 3 days.  Pain seems to be reproducible with palpation over right chest area.  No previous ischemic work-ups in the past. Cardiology recommends outpatient stress test.  Right pleural effusion Patient with moderate right-sided pleural effusion.  Underwent thoracentesis by IR on 12/26/2019 with 700 mL fluid removed.  Lights criteria consistent with transudate of effusion. --Continue supplemental oxygen, titrate to maintain SPO2 greater than 92%  Essential hypertension BP 121/72 this  morning --Continue amlodipine 10 mg daily --hydralazine 50 mg TID --metoprolol 25 mg BID --isosorbide increased to 60 mg p.o. qHS  Chronic kidney disease stage IV Patient baseline creatinine previously noted to be around  2.7.  Patient presents with creatinine elevated up to 2.96 with BUN 42.  Patient followed in the outpatient setting by Dr. Royce Macadamia of [facility].  Records note a prior history of cardiorenal syndrome.  Renal ultrasound with no evidence of hydronephrosis, echogenic renal parenchyma bilaterally consistent with medical renal disease.  Renal ultrasound consistent with medical renal disease.  Renal artery ultrasound negative for significant stenosis. --Cr 2.96>2.91>2.92>3.16>3.47 --Avoid nephrotoxins, renally dose all medications --Discontinue Lasix and metolazone as above --Follow BMP daily  Type 2 diabetes mellitus Hemoglobin A1c 7.1 10/29/2019. Hx of hypoglycemic episodes with insulin sliding scale --monitor CBGs before every meal/at bedtime --Carbohydrate modified diet  Anemia of chronic disease:  Hemoglobin 11.2 which appears improved from previous.  --Continue oral iron supplementation  Hypocalcemia: Acute on chronic.   On admission calcium level noted to be 7.6.  8.2 this morning --Continue calcium carbonate  Vertigo: Chronic.   Patient had been started on meclizine TID with some improvement in symptoms.  --Continue scheduled meclizine  DVT prophylaxis: Heparin Code Status: Full code Family Communication: No family present at bedside this morning  Disposition Plan:  Status is: Inpatient  Remains inpatient appropriate because:Ongoing diagnostic testing needed not appropriate for outpatient work up, Unsafe d/c plan, IV treatments appropriate due to intensity of illness or inability to take PO and Inpatient level of care appropriate due to severity of illness   Dispo: The patient is from: Home              Anticipated d/c is to: Home              Anticipated d/c date is: 2 days              Patient currently is not medically stable to d/c.   Consultants:   Cardiology -signed off 12/27/2019  Procedures:   Nuclear med cardiac amyloid  Antimicrobials:    None   Subjective: Patient seen and examined at bedside, resting comfortably.  Nuclear med amyloid scan negative.  Creatinine continues to up trend, cardiology discontinued metolazone and IV Lasix today.  Cardiology plans to follow-up outpatient for stress test.  Patient continues to have shortness of breath and intermittent right chest pain.  Noted to have ambulatory desaturations with exertion while working with PT.  No other questions or concerns at this time. Denies headache, no visual changes, no fever/chills/night sweats, no nausea/vomiting/diarrhea, no palpitations, no abdominal pain, no weakness, no fatigue, no paresthesias.  No acute events overnight per nursing staff.  Objective: Vitals:   12/27/19 0018 12/27/19 0617 12/27/19 1010 12/27/19 1205  BP: 115/72 121/72 140/77 126/76  Pulse: 73 75 74 71  Resp: 16   16  Temp: 98.7 F (37.1 C)   (!) 97.5 F (36.4 C)  TempSrc: Oral   Oral  SpO2: 96% 94%  98%  Weight:      Height:        Intake/Output Summary (Last 24 hours) at 12/27/2019 1429 Last data filed at 12/27/2019 1220 Gross per 24 hour  Intake 600 ml  Output 450 ml  Net 150 ml   Filed Weights   12/23/19 1511 12/24/19 0621 12/25/19 0500  Weight: 95.5 kg 94.9 kg 94.1 kg    Examination:  General exam: Appears calm and comfortable  Respiratory system:  Clear to auscultation. Respiratory effort normal.  On 0.5 L nasal cannula with SPO2 96%, not on home oxygen Cardiovascular system: S1 & S2 heard, RRR. No JVD, murmurs, rubs, gallops or clicks. No pedal edema.  Reproducible right-sided chest wall pain with palpation Gastrointestinal system: Abdomen is nondistended, soft and nontender. No organomegaly or masses felt. Normal bowel sounds heard. Central nervous system: Alert and oriented. No focal neurological deficits. Extremities: Symmetric 5 x 5 power. Skin: No rashes, lesions or ulcers Psychiatry: Judgement and insight appear normal. Mood & affect appropriate.      Data Reviewed: I have personally reviewed following labs and imaging studies  CBC: Recent Labs  Lab 12/23/19 0636  WBC 8.3  NEUTROABS 5.4  HGB 11.2*  HCT 37.5*  MCV 90.6  PLT 974   Basic Metabolic Panel: Recent Labs  Lab 12/23/19 0636 12/24/19 0320 12/25/19 0456 12/26/19 0829 12/27/19 0539  NA 140 143 141 141 139  K 4.5 4.7 3.8 4.2 4.0  CL 110 108 102 99 98  CO2 21* 25 27 27 31   GLUCOSE 147* 102* 118* 170* 121*  BUN 42* 42* 39* 39* 44*  CREATININE 2.96* 2.91* 2.92* 3.16* 3.47*  CALCIUM 7.6* 7.9* 8.0* 8.2* 7.7*   GFR: Estimated Creatinine Clearance: 22.8 mL/min (A) (by C-G formula based on SCr of 3.47 mg/dL (H)). Liver Function Tests: Recent Labs  Lab 12/23/19 0636 12/26/19 1940  AST 21  --   ALT 24  --   ALKPHOS 98  --   BILITOT 0.9  --   PROT 7.5 7.7  ALBUMIN 3.5  --    No results for input(s): LIPASE, AMYLASE in the last 168 hours. No results for input(s): AMMONIA in the last 168 hours. Coagulation Profile: No results for input(s): INR, PROTIME in the last 168 hours. Cardiac Enzymes: No results for input(s): CKTOTAL, CKMB, CKMBINDEX, TROPONINI in the last 168 hours. BNP (last 3 results) No results for input(s): PROBNP in the last 8760 hours. HbA1C: No results for input(s): HGBA1C in the last 72 hours. CBG: Recent Labs  Lab 12/26/19 1110 12/26/19 1601 12/26/19 2105 12/27/19 0618 12/27/19 1110  GLUCAP 158* 124* 202* 120* 203*   Lipid Profile: No results for input(s): CHOL, HDL, LDLCALC, TRIG, CHOLHDL, LDLDIRECT in the last 72 hours. Thyroid Function Tests: No results for input(s): TSH, T4TOTAL, FREET4, T3FREE, THYROIDAB in the last 72 hours. Anemia Panel: Recent Labs    12/26/19 1940  FERRITIN 143  TIBC 221*  IRON 42*   Sepsis Labs: No results for input(s): PROCALCITON, LATICACIDVEN in the last 168 hours.  Recent Results (from the past 240 hour(s))  SARS Coronavirus 2 by RT PCR (hospital order, performed in [facility] hospital  lab) Nasopharyngeal Nasopharyngeal Swab     Status: None   Collection Time: 12/23/19 10:59 AM   Specimen: Nasopharyngeal Swab  Result Value Ref Range Status   SARS Coronavirus 2 NEGATIVE NEGATIVE Final    Comment: (NOTE) SARS-CoV-2 target nucleic acids are NOT DETECTED.  The SARS-CoV-2 RNA is generally detectable in upper and lower respiratory specimens during the acute phase of infection. The lowest concentration of SARS-CoV-2 viral copies this assay can detect is 250 copies / mL. A negative result does not preclude SARS-CoV-2 infection and should not be used as the sole basis for treatment or other patient management decisions.  A negative result may occur with improper specimen collection / handling, submission of specimen other than nasopharyngeal swab, presence of viral mutation(s) within the areas targeted by this  assay, and inadequate number of viral copies (<250 copies / mL). A negative result must be combined with clinical observations, patient history, and epidemiological information.  Fact Sheet for Patients:   StrictlyIdeas.no  Fact Sheet for Healthcare Providers: BankingDealers.co.za  This test is not yet approved or  cleared by the [place] FDA and has been authorized for detection and/or diagnosis of SARS-CoV-2 by FDA under an Emergency Use Authorization (EUA).  This EUA will remain in effect (meaning this test can be used) for the duration of the COVID-19 declaration under Section 564(b)(1) of the Act, 21 U.S.C. section 360bbb-3(b)(1), unless the authorization is terminated or revoked sooner.  Performed at [facility], [place] [address] [place], [place] [address]   Gram stain     Status: None   Collection Time: 12/26/19  3:52 PM   Specimen: PATH Cytology Pleural fluid  Result Value Ref Range Status   Specimen Description FLUID PLEURAL  Final   Special Requests NONE  Final   Gram Stain   Final     ABUNDANT WBC PRESENT, PREDOMINANTLY MONONUCLEAR NO ORGANISMS SEEN Performed at [facility], [place] [address], [place], [place] [address]    Report Status 12/26/2019 FINAL  Final  Culture, body fluid-bottle     Status: None (Preliminary result)   Collection Time: 12/26/19  3:52 PM   Specimen: Fluid  Result Value Ref Range Status   Specimen Description FLUID PLEURAL  Final   Special Requests   Final    BOTTLES DRAWN AEROBIC AND ANAEROBIC Blood Culture adequate volume   Culture   Final    NO GROWTH < 24 HOURS Performed at [facility], [place] [address] [place], [place] [address]    Report Status PENDING  Incomplete         Radiology Studies: DG Chest 1 View  Result Date: 12/26/2019 CLINICAL DATA:  Status post right-sided thoracentesis EXAM: CHEST  1 VIEW COMPARISON:  12/23/2019 FINDINGS: Interval decrease in right-sided pleural effusion status post thoracentesis. Mild hazy right basilar opacity, significantly improved from prior. Left lung remains clear. No pneumothorax. Stable heart size. IMPRESSION: 1. Decreased right-sided pleural effusion status post thoracentesis. No pneumothorax. 2. Improved aeration of the right lung base. Electronically Signed   By: Davina Poke D.O.   On: 12/26/2019 16:14   NM CARDIAC AMYLOID TUMOR LOC INFLAM SPECT 1 DAY  Result Date: 12/26/2019 CLINICAL DATA:  HEART FAILURE. CONCERN FOR CARDIAC AMYLOIDOSIS. EXAM: NUCLEAR MEDICINE TUMOR LOCALIZATION. PYP CARDIAC AMYLOIDOSIS SCAN WITH SPECT TECHNIQUE: Following intravenous administration of radiopharmaceutical, anterior planar images of the chest were obtained. Regions of interest were placed on the heart and contralateral chest wall for quantitative assessment. Additional SPECT imaging of the chest was obtained. RADIOPHARMACEUTICALS:  8 mCi TECHNETIUM 99 PYROPHOSPHATE FINDINGS: Planar Visual assessment: Anterior planar imaging demonstrates radiotracer uptake within the heart less than uptake within  the adjacent ribs (Grade 1). Quantitative assessment : Quantitative assessment of the cardiac uptake compared to the contralateral chest wall is equal to 1.1 (H/CL = 1.1). SPECT assessment: SPECT imaging of the chest demonstrates minimal radiotracer accumulation within the LEFT ventricle. IMPRESSION: Visual and quantitative assessment (grade 1, H/CLL equal 1.1) are NOT suggestive of transthyretin amyloidosis. Electronically Signed   By: Suzy Bouchard M.D.   On: 12/26/2019 16:57   VAS US RENAL ARTERY DUPLEX  Result Date: 12/27/2019 ABDOMINAL VISCERAL Indications: Hypertension. High Risk Factors: Hyperlipidemia, Diabetes, coronary artery disease. Other Factors: Stage IV chronic kidney disease. Comparison Study: No  prior. Performing Technologist: Oda Cogan RDMS, RVT  Examination Guidelines: A complete evaluation includes B-mode imaging, spectral Doppler, color Doppler, and power Doppler as needed of all accessible portions of each vessel. Bilateral testing is considered an integral part of a complete examination. Limited examinations for reoccurring indications may be performed as noted.  Duplex Findings: +--------------------+--------+--------+------+--------+ Mesenteric          PSV cm/sEDV cm/sPlaqueComments +--------------------+--------+--------+------+--------+ Aorta Mid              92                          +--------------------+--------+--------+------+--------+ Celiac Artery Origin  269                          +--------------------+--------+--------+------+--------+ SMA Proximal          222                          +--------------------+--------+--------+------+--------+    +------------------+--------+--------+-------+ Right Renal ArteryPSV cm/sEDV cm/sComment +------------------+--------+--------+-------+ Origin              123      16           +------------------+--------+--------+-------+ Proximal            122      17            +------------------+--------+--------+-------+ Mid                  58      13           +------------------+--------+--------+-------+ Distal               59      12           +------------------+--------+--------+-------+ +-----------------+--------+--------+-------+ Left Renal ArteryPSV cm/sEDV cm/sComment +-----------------+--------+--------+-------+ Origin              69      10           +-----------------+--------+--------+-------+ Proximal            72      10           +-----------------+--------+--------+-------+ Mid                 46      9            +-----------------+--------+--------+-------+ Distal              42      10           +-----------------+--------+--------+-------+ +------------+--------+--------+----+-----------+--------+--------+----+ Right KidneyPSV cm/sEDV cm/sRI  Left KidneyPSV cm/sEDV cm/sRI   +------------+--------+--------+----+-----------+--------+--------+----+ Upper Pole  23      6       0.76Upper Pole 27      7       0.74 +------------+--------+--------+----+-----------+--------+--------+----+ Mid         29      6       0.81Mid        30      6       0.81 +------------+--------+--------+----+-----------+--------+--------+----+ Lower Pole  24                  Lower Pole 26      8       0.69 +------------+--------+--------+----+-----------+--------+--------+----+ Hilar       45  8       0.82Hilar      24      7       0.73 +------------+--------+--------+----+-----------+--------+--------+----+ +------------------+-----+------------------+-----+ Right Kidney           Left Kidney             +------------------+-----+------------------+-----+ RAR                    RAR                     +------------------+-----+------------------+-----+ RAR (manual)      1.3  RAR (manual)      0.78  +------------------+-----+------------------+-----+ Cortex                 Cortex                   +------------------+-----+------------------+-----+ Cortex thickness       Corex thickness         +------------------+-----+------------------+-----+ Kidney length (cm)11.40Kidney length (cm)10.07 +------------------+-----+------------------+-----+   Summary: Renal:  Right: No evidence of right renal artery stenosis. Abnormal right        Resistive Index. Left:  No evidence of left renal artery stenosis. Abnormal left        Resisitve Index.  *See table(s) above for measurements and observations.     Preliminary    IR THORACENTESIS ASP PLEURAL SPACE W/IMG GUIDE  Result Date: 12/26/2019 INDICATION: Patient with history of CHF, now with right pleural effusion. Request is made for diagnostic and therapeutic thoracentesis. EXAM: ULTRASOUND GUIDED DIAGNOSTIC AND THERAPEUTIC RIGHT THORACENTESIS MEDICATIONS: 10 mL 1% lidocaine COMPLICATIONS: None immediate. PROCEDURE: An ultrasound guided thoracentesis was thoroughly discussed with the patient and questions answered. The benefits, risks, alternatives and complications were also discussed. The patient understands and wishes to proceed with the procedure. Written consent was obtained. Ultrasound was performed to localize and mark an adequate pocket of fluid in the right chest. The area was then prepped and draped in the normal sterile fashion. 1% Lidocaine was used for local anesthesia. Under ultrasound guidance a 6 Fr Safe-T-Centesis catheter was introduced. Thoracentesis was performed. The catheter was removed and a dressing applied. FINDINGS: A total of approximately 700 mL of amber fluid was removed. Samples were sent to the laboratory as requested by the clinical team. IMPRESSION: Successful ultrasound guided diagnostic and therapeutic right thoracentesis yielding 700 mL of pleural fluid. Read by: Brynda Greathouse PA-C No pneumothorax on follow-up radiograph. Electronically Signed   By: Lucrezia Europe M.D.   On: 12/26/2019 15:47         Scheduled Meds: . amLODipine  10 mg Oral Daily  . aspirin EC  81 mg Oral Daily  . calcitRIOL  0.25 mcg Oral Once per day on Mon Wed Fri  . calcium carbonate  2 tablet Oral Daily  . carvedilol  12.5 mg Oral BID WC  . ferrous sulfate  325 mg Oral Q breakfast  . gabapentin  300 mg Oral BID  . heparin  5,000 Units Subcutaneous Q8H  . hydrALAZINE  50 mg Oral TID  . isosorbide mononitrate  60 mg Oral QPM  . meclizine  25 mg Oral TID  . sodium chloride flush  3 mL Intravenous Q12H   Continuous Infusions: . sodium chloride       LOS: 4 days    Time spent: 36 minutes spent on chart review, discussion with nursing staff, consultants, updating family and interview/physical exam; more than 50%  of that time was spent in counseling and/or coordination of care.    Kayron Hicklin J [place] DO [facility] Available via Epic secure chat 7am-7pm After these hours, please refer to coverage provider listed on [email or web address] 12/27/2019, 2:29 PM

## 2019-12-27 NOTE — Progress Notes (Signed)
Physical Therapy Note  SATURATION QUALIFICATIONS: (This note is used to comply with regulatory documentation for home oxygen)  Patient Saturations on Room Air at Rest = 90%  Patient Saturations on Room Air while Ambulating = 85%  Patient Saturations on 2 Liters of oxygen while Ambulating = 97%%  Please briefly explain why patient needs home oxygen: Patient requires supplemental oxygen to maintain oxygen saturations at acceptable, safe levels with physical activity.  Roney Marion, [place]  Acute Rehabilitation Services Pager [phone] Office [phone]

## 2019-12-28 LAB — URINALYSIS, ROUTINE W REFLEX MICROSCOPIC
Bilirubin Urine: NEGATIVE
Glucose, UA: NEGATIVE mg/dL
Hgb urine dipstick: NEGATIVE
Ketones, ur: NEGATIVE mg/dL
Leukocytes,Ua: NEGATIVE
Nitrite: NEGATIVE
Protein, ur: 30 mg/dL — AB
Specific Gravity, Urine: 1.014 (ref 1.005–1.030)
pH: 5 (ref 5.0–8.0)

## 2019-12-28 LAB — COMPREHENSIVE METABOLIC PANEL
ALT: 17 U/L (ref 0–44)
AST: 23 U/L (ref 15–41)
Albumin: 3.2 g/dL — ABNORMAL LOW (ref 3.5–5.0)
Alkaline Phosphatase: 80 U/L (ref 38–126)
Anion gap: 13 (ref 5–15)
BUN: 56 mg/dL — ABNORMAL HIGH (ref 8–23)
CO2: 28 mmol/L (ref 22–32)
Calcium: 7.3 mg/dL — ABNORMAL LOW (ref 8.9–10.3)
Chloride: 95 mmol/L — ABNORMAL LOW (ref 98–111)
Creatinine, Ser: 3.89 mg/dL — ABNORMAL HIGH (ref 0.61–1.24)
GFR calc Af Amer: 17 mL/min — ABNORMAL LOW (ref >60)
GFR calc non Af Amer: 15 mL/min — ABNORMAL LOW (ref >60)
Glucose, Bld: 268 mg/dL — ABNORMAL HIGH (ref 70–99)
Potassium: 4.2 mmol/L (ref 3.5–5.1)
Sodium: 136 mmol/L (ref 135–145)
Total Bilirubin: 0.6 mg/dL (ref 0.3–1.2)
Total Protein: 7.5 g/dL (ref 6.5–8.1)

## 2019-12-28 LAB — PROTEIN / CREATININE RATIO, URINE
Creatinine, Urine: 180.52 mg/dL
Protein Creatinine Ratio: 0.29 mg/mg{Cre} — ABNORMAL HIGH (ref 0.00–0.15)
Total Protein, Urine: 52 mg/dL

## 2019-12-28 LAB — BASIC METABOLIC PANEL
Anion gap: 10 (ref 5–15)
BUN: 55 mg/dL — ABNORMAL HIGH (ref 8–23)
CO2: 30 mmol/L (ref 22–32)
Calcium: 7.1 mg/dL — ABNORMAL LOW (ref 8.9–10.3)
Chloride: 96 mmol/L — ABNORMAL LOW (ref 98–111)
Creatinine, Ser: 3.83 mg/dL — ABNORMAL HIGH (ref 0.61–1.24)
GFR calc Af Amer: 17 mL/min — ABNORMAL LOW (ref >60)
GFR calc non Af Amer: 15 mL/min — ABNORMAL LOW (ref >60)
Glucose, Bld: 206 mg/dL — ABNORMAL HIGH (ref 70–99)
Potassium: 4 mmol/L (ref 3.5–5.1)
Sodium: 136 mmol/L (ref 135–145)

## 2019-12-28 LAB — SODIUM, URINE, RANDOM: Sodium, Ur: 29 mmol/L

## 2019-12-28 LAB — GLUCOSE, CAPILLARY
Glucose-Capillary: 162 mg/dL — ABNORMAL HIGH (ref 70–99)
Glucose-Capillary: 179 mg/dL — ABNORMAL HIGH (ref 70–99)
Glucose-Capillary: 241 mg/dL — ABNORMAL HIGH (ref 70–99)
Glucose-Capillary: 200 mg/dL — ABNORMAL HIGH (ref 70–99)

## 2019-12-28 LAB — PH, BODY FLUID: pH, Body Fluid: 7.7

## 2019-12-28 LAB — CYTOLOGY - NON PAP

## 2019-12-28 MED ORDER — SODIUM CHLORIDE 0.9 % IV BOLUS
500.0000 mL | Freq: Once | INTRAVENOUS | Status: AC
  Administered 2019-12-28: 500 mL via INTRAVENOUS

## 2019-12-28 NOTE — Progress Notes (Signed)
Progress Note    Melvin Mcclure  XNT:[id number] DOB: February 07, 1951  DOA: 12/23/2019 PCP: Nolene Ebbs, MD    Brief Narrative:     Medical records reviewed and are as summarized below:  Melvin Mcclure is an 69 y.o. male with medical history significant of diastolic HF, hypertension, diabetes mellitus type 2, CKD stage IV, and vertigo, presents with complaints of shortness of breath this morning.  Patient had just recently been hospitalized from 6/25-7/4 for CHF exacerbation with community-acquired pneumonia.  Over the last week or so he would be getting more short of breath even walking short distances.  His wife notes that he was put back on oxygen due to his symptoms.  He had reportedly been intermittently wheezing and not sleeping well due to the shortness of breath  Assessment/Plan:   Principal Problem:   Acute exacerbation of CHF (congestive heart failure) (HCC) Active Problems:   CKD stage 4 due to type 2 diabetes mellitus (HCC)   Hypocalcemia   Anemia of chronic disease   Vertigo   Acute on chronic respiratory failure with hypoxia (HCC)    Acute hypoxic respiratory failure Acute on chronic diastolic congestive heart failure Diastolic congestive heart failure exacerbation: Acute on chronic. On chest x-ray showing cardiomegaly with pulmonary vascular congestion and bilateral interstitial prominence suggestive of CHF with her right-sided pleural effusion. Initial O2 saturations noted to be around 84% on room improved with 3 L nasal cannula oxygen. Last EF noted to be 55 to 60% by echocardiogram on 10/29/2019.At his last discharge his dry weight was noted to be 87 kg, but he presents today at 95.5 kg.Patient had been given 80 mg of Lasix IV in ED. --Cardiology consulted and have signed off --unfortunately no urine was recorded yesterday --Cardiology discontinued furosemide and metolazone 8/24 --Continue supplemental oxygen, maintain SPO2 greater than 92% currently on 1 L  nasal cannula with SPO2 94% --Fluid restriction 1500 mL/day --Strict I's and O's and daily weights: if weight is accurate, he is down from 95kg to 85 kg this hospitalization  Chest pain, atypical Patient complaining of right-sided chest pain, onset after significant coughing spells over the past 3 days.  Pain seems to be reproducible with palpation over right chest area.  No previous ischemic work-ups in the past. Cardiology recommends outpatient stress test.  Right pleural effusion Patient with moderate right-sided pleural effusion.  Underwent thoracentesis by IR on 12/26/2019 with 700 mL fluid removed.  Lights criteria consistent with transudate of effusion. --Continue supplemental oxygen, titrate to maintain SPO2 greater than 92% -home O2 study  Essential hypertension --Continue amlodipine 10 mg daily --hydralazine 50 mg TID --metoprolol 25 mg BID --isosorbide increased to 60 mg p.o. qHS  Acute on Chronic kidney disease stage IV Patient baseline creatinine previously noted to be around 2.7.  -Patient followedin the outpatient setting by Dr. Royce Macadamia of [facility]. -Records note a prior history of cardiorenal syndrome.   -Renal ultrasound with no evidence of hydronephrosis, echogenic renal parenchyma bilaterally consistent with medical renal disease.  Renal ultrasound consistent with medical renal disease.  Renal artery ultrasound negative for significant stenosis. --Discontinued Lasix and metolazone on 8/24 --nephrology consult as Cr continues to trend up-- cardiology defers diuretic management to them -urinalysis ordered  Type 2 diabetes mellitus Hemoglobin A1c 7.1 10/29/2019. Hx of hypoglycemic episodes with insulin sliding scale --monitor CBGs before every meal/at bedtime --Carbohydrate modified diet  Anemia of chronic disease:  Hemoglobin 11.2 which appears improved from previous.  --Continue oral iron supplementation  Hypocalcemia: Acute on chronic.   On admission calcium level noted to be 7.6.  8.2 this morning --Continue calcium carbonate  Vertigo: Chronic.  Patient had been started on meclizine TID with some improvement in symptoms.  --Continue scheduled meclizine   Family Communication/Anticipated D/C date and plan/Code Status   DVT prophylaxis: heparin Code Status: Full Code.  Disposition Plan: Status is: Inpatient  Remains inpatient appropriate because:Inpatient level of care appropriate due to severity of illness   Dispo:  Patient From: Home  Planned Disposition: Home  Expected discharge date: 12/29/19  Medically stable for discharge: No          Medical Consultants:    Cards  renal     Subjective:   Wants to go home  Objective:    Vitals:   12/28/19 0045 12/28/19 0610 12/28/19 0915 12/28/19 1209  BP: 131/68 (!) 143/77 136/80 (!) 161/81  Pulse: 73 74 77 78  Resp: 17 17  16   Temp: 98.4 F (36.9 C) 98.1 F (36.7 C) 98.4 F (36.9 C) 98 F (36.7 C)  TempSrc: Oral Oral Oral Oral  SpO2: 97% 98% 97% 95%  Weight:  85.3 kg    Height:        Intake/Output Summary (Last 24 hours) at 12/28/2019 1335 Last data filed at 12/28/2019 1610 Gross per 24 hour  Intake 483 ml  Output --  Net 483 ml   Filed Weights   12/24/19 0621 12/25/19 0500 12/28/19 0610  Weight: 94.9 kg 94.1 kg 85.3 kg    Exam:  General: Appearance:     Overweight male in no acute distress     Lungs:     respirations unlabored, on [place]  Heart:    Normal heart rate. Normal rhythm. No murmurs, rubs, or gallops.   MS:   All extremities are intact.   Neurologic:   Awake, alert, oriented x 3. No apparent focal neurological           defect.     Data Reviewed:   I have personally reviewed following labs and imaging studies:  Labs: Labs show the following:   Basic Metabolic Panel: Recent Labs  Lab 12/24/19 0320 12/24/19 0320 12/25/19 0456 12/25/19 0456 12/26/19 0829 12/26/19 0829 12/27/19 0539 12/28/19 0932  NA  143  --  141  --  141  --  139 136  K 4.7   < > 3.8   < > 4.2   < > 4.0 4.0  CL 108  --  102  --  99  --  98 96*  CO2 25  --  27  --  27  --  31 30  GLUCOSE 102*  --  118*  --  170*  --  121* 206*  BUN 42*  --  39*  --  39*  --  44* 55*  CREATININE 2.91*  --  2.92*  --  3.16*  --  3.47* 3.83*  CALCIUM 7.9*  --  8.0*  --  8.2*  --  7.7* 7.1*   < > = values in this interval not displayed.   GFR Estimated Creatinine Clearance: 18.5 mL/min (A) (by C-G formula based on SCr of 3.83 mg/dL (H)). Liver Function Tests: Recent Labs  Lab 12/23/19 0636 12/26/19 1940  AST 21  --   ALT 24  --   ALKPHOS 98  --   BILITOT 0.9  --   PROT 7.5 7.7  ALBUMIN 3.5  --    No results for input(s):  LIPASE, AMYLASE in the last 168 hours. No results for input(s): AMMONIA in the last 168 hours. Coagulation profile No results for input(s): INR, PROTIME in the last 168 hours.  CBC: Recent Labs  Lab 12/23/19 0636  WBC 8.3  NEUTROABS 5.4  HGB 11.2*  HCT 37.5*  MCV 90.6  PLT 277   Cardiac Enzymes: No results for input(s): CKTOTAL, CKMB, CKMBINDEX, TROPONINI in the last 168 hours. BNP (last 3 results) No results for input(s): PROBNP in the last 8760 hours. CBG: Recent Labs  Lab 12/27/19 1110 12/27/19 1615 12/27/19 2140 12/28/19 0613 12/28/19 1104  GLUCAP 203* 213* 198* 162* 179*   D-Dimer: No results for input(s): DDIMER in the last 72 hours. Hgb A1c: No results for input(s): HGBA1C in the last 72 hours. Lipid Profile: No results for input(s): CHOL, HDL, LDLCALC, TRIG, CHOLHDL, LDLDIRECT in the last 72 hours. Thyroid function studies: No results for input(s): TSH, T4TOTAL, T3FREE, THYROIDAB in the last 72 hours.  Invalid input(s): FREET3 Anemia work up: Recent Labs    12/26/19 1940  FERRITIN 143  TIBC 221*  IRON 42*   Sepsis Labs: Recent Labs  Lab 12/23/19 0636  WBC 8.3    Microbiology Recent Results (from the past 240 hour(s))  SARS Coronavirus 2 by RT PCR (hospital  order, performed in [facility] hospital lab) Nasopharyngeal Nasopharyngeal Swab     Status: None   Collection Time: 12/23/19 10:59 AM   Specimen: Nasopharyngeal Swab  Result Value Ref Range Status   SARS Coronavirus 2 NEGATIVE NEGATIVE Final    Comment: (NOTE) SARS-CoV-2 target nucleic acids are NOT DETECTED.  The SARS-CoV-2 RNA is generally detectable in upper and lower respiratory specimens during the acute phase of infection. The lowest concentration of SARS-CoV-2 viral copies this assay can detect is 250 copies / mL. A negative result does not preclude SARS-CoV-2 infection and should not be used as the sole basis for treatment or other patient management decisions.  A negative result may occur with improper specimen collection / handling, submission of specimen other than nasopharyngeal swab, presence of viral mutation(s) within the areas targeted by this assay, and inadequate number of viral copies (<250 copies / mL). A negative result must be combined with clinical observations, patient history, and epidemiological information.  Fact Sheet for Patients:   StrictlyIdeas.no  Fact Sheet for Healthcare Providers: BankingDealers.co.za  This test is not yet approved or  cleared by the [place] FDA and has been authorized for detection and/or diagnosis of SARS-CoV-2 by FDA under an Emergency Use Authorization (EUA).  This EUA will remain in effect (meaning this test can be used) for the duration of the COVID-19 declaration under Section 564(b)(1) of the Act, 21 U.S.C. section 360bbb-3(b)(1), unless the authorization is terminated or revoked sooner.  Performed at [facility], [place] [address] [place], [place] [address]   Gram stain     Status: None   Collection Time: 12/26/19  3:52 PM   Specimen: PATH Cytology Pleural fluid  Result Value Ref Range Status   Specimen Description FLUID PLEURAL  Final   Special Requests  NONE  Final   Gram Stain   Final    ABUNDANT WBC PRESENT, PREDOMINANTLY MONONUCLEAR NO ORGANISMS SEEN Performed at [facility], [place] [address] [place], [place] [address]    Report Status 12/26/2019 FINAL  Final  Culture, body fluid-bottle     Status: None (Preliminary result)   Collection Time: 12/26/19  3:52 PM   Specimen:  Fluid  Result Value Ref Range Status   Specimen Description FLUID PLEURAL  Final   Special Requests   Final    BOTTLES DRAWN AEROBIC AND ANAEROBIC Blood Culture adequate volume   Culture   Final    NO GROWTH 2 DAYS Performed at [facility], [address], [place], [place] [address]    Report Status PENDING  Incomplete    Procedures and diagnostic studies:  DG Chest 1 View  Result Date: 12/26/2019 CLINICAL DATA:  Status post right-sided thoracentesis EXAM: CHEST  1 VIEW COMPARISON:  12/23/2019 FINDINGS: Interval decrease in right-sided pleural effusion status post thoracentesis. Mild hazy right basilar opacity, significantly improved from prior. Left lung remains clear. No pneumothorax. Stable heart size. IMPRESSION: 1. Decreased right-sided pleural effusion status post thoracentesis. No pneumothorax. 2. Improved aeration of the right lung base. Electronically Signed   By: Davina Poke D.O.   On: 12/26/2019 16:14   NM CARDIAC AMYLOID TUMOR LOC INFLAM SPECT 1 DAY  Result Date: 12/26/2019 CLINICAL DATA:  HEART FAILURE. CONCERN FOR CARDIAC AMYLOIDOSIS. EXAM: NUCLEAR MEDICINE TUMOR LOCALIZATION. PYP CARDIAC AMYLOIDOSIS SCAN WITH SPECT TECHNIQUE: Following intravenous administration of radiopharmaceutical, anterior planar images of the chest were obtained. Regions of interest were placed on the heart and contralateral chest wall for quantitative assessment. Additional SPECT imaging of the chest was obtained. RADIOPHARMACEUTICALS:  8 mCi TECHNETIUM 99 PYROPHOSPHATE FINDINGS: Planar Visual assessment: Anterior planar imaging demonstrates radiotracer uptake  within the heart less than uptake within the adjacent ribs (Grade 1). Quantitative assessment : Quantitative assessment of the cardiac uptake compared to the contralateral chest wall is equal to 1.1 (H/CL = 1.1). SPECT assessment: SPECT imaging of the chest demonstrates minimal radiotracer accumulation within the LEFT ventricle. IMPRESSION: Visual and quantitative assessment (grade 1, H/CLL equal 1.1) are NOT suggestive of transthyretin amyloidosis. Electronically Signed   By: Suzy Bouchard M.D.   On: 12/26/2019 16:57   VAS US RENAL ARTERY DUPLEX  Result Date: 12/27/2019 ABDOMINAL VISCERAL Indications: Hypertension. High Risk Factors: Hyperlipidemia, Diabetes, coronary artery disease. Other Factors: Stage IV chronic kidney disease. Comparison Study: No prior. Performing Technologist: Oda Cogan RDMS, RVT  Examination Guidelines: A complete evaluation includes B-mode imaging, spectral Doppler, color Doppler, and power Doppler as needed of all accessible portions of each vessel. Bilateral testing is considered an integral part of a complete examination. Limited examinations for reoccurring indications may be performed as noted.  Duplex Findings: +--------------------+--------+--------+------+--------+ Mesenteric          PSV cm/sEDV cm/sPlaqueComments +--------------------+--------+--------+------+--------+ Aorta Mid              92                          +--------------------+--------+--------+------+--------+ Celiac Artery Origin  269                          +--------------------+--------+--------+------+--------+ SMA Proximal          222                          +--------------------+--------+--------+------+--------+    +------------------+--------+--------+-------+ Right Renal ArteryPSV cm/sEDV cm/sComment +------------------+--------+--------+-------+ Origin              123      16           +------------------+--------+--------+-------+ Proximal             122  17           +------------------+--------+--------+-------+ Mid                  58      13           +------------------+--------+--------+-------+ Distal               59      12           +------------------+--------+--------+-------+ +-----------------+--------+--------+-------+ Left Renal ArteryPSV cm/sEDV cm/sComment +-----------------+--------+--------+-------+ Origin              69      10           +-----------------+--------+--------+-------+ Proximal            72      10           +-----------------+--------+--------+-------+ Mid                 46      9            +-----------------+--------+--------+-------+ Distal              42      10           +-----------------+--------+--------+-------+ +------------+--------+--------+----+-----------+--------+--------+----+ Right KidneyPSV cm/sEDV cm/sRI  Left KidneyPSV cm/sEDV cm/sRI   +------------+--------+--------+----+-----------+--------+--------+----+ Upper Pole  23      6       0.76Upper Pole 27      7       0.74 +------------+--------+--------+----+-----------+--------+--------+----+ Mid         29      6       0.81Mid        30      6       0.81 +------------+--------+--------+----+-----------+--------+--------+----+ Lower Pole  24                  Lower Pole 26      8       0.69 +------------+--------+--------+----+-----------+--------+--------+----+ Hilar       45      8       0.82Hilar      24      7       0.73 +------------+--------+--------+----+-----------+--------+--------+----+ +------------------+-----+------------------+-----+ Right Kidney           Left Kidney             +------------------+-----+------------------+-----+ RAR                    RAR                     +------------------+-----+------------------+-----+ RAR (manual)      1.3  RAR (manual)      0.78  +------------------+-----+------------------+-----+ Cortex                  Cortex                  +------------------+-----+------------------+-----+ Cortex thickness       Corex thickness         +------------------+-----+------------------+-----+ Kidney length (cm)11.40Kidney length (cm)10.07 +------------------+-----+------------------+-----+  Summary: Renal:  Right: No evidence of right renal artery stenosis. Abnormal right        Resistive Index. Left:  No evidence of left renal artery stenosis. Abnormal left        Resisitve Index.  *See table(s) above for measurements and observations.  Diagnosing physician: Harold Barban MD  Electronically signed by  Harold Barban MD on 12/27/2019 at 5:40:10 PM.    Final    IR THORACENTESIS ASP PLEURAL SPACE W/IMG GUIDE  Result Date: 12/26/2019 INDICATION: Patient with history of CHF, now with right pleural effusion. Request is made for diagnostic and therapeutic thoracentesis. EXAM: ULTRASOUND GUIDED DIAGNOSTIC AND THERAPEUTIC RIGHT THORACENTESIS MEDICATIONS: 10 mL 1% lidocaine COMPLICATIONS: None immediate. PROCEDURE: An ultrasound guided thoracentesis was thoroughly discussed with the patient and questions answered. The benefits, risks, alternatives and complications were also discussed. The patient understands and wishes to proceed with the procedure. Written consent was obtained. Ultrasound was performed to localize and mark an adequate pocket of fluid in the right chest. The area was then prepped and draped in the normal sterile fashion. 1% Lidocaine was used for local anesthesia. Under ultrasound guidance a 6 Fr Safe-T-Centesis catheter was introduced. Thoracentesis was performed. The catheter was removed and a dressing applied. FINDINGS: A total of approximately 700 mL of amber fluid was removed. Samples were sent to the laboratory as requested by the clinical team. IMPRESSION: Successful ultrasound guided diagnostic and therapeutic right thoracentesis yielding 700 mL of pleural fluid. Read by: Brynda Greathouse PA-C No  pneumothorax on follow-up radiograph. Electronically Signed   By: Lucrezia Europe M.D.   On: 12/26/2019 15:47    Medications:   . amLODipine  10 mg Oral Daily  . aspirin EC  81 mg Oral Daily  . calcitRIOL  0.25 mcg Oral Once per day on Mon Wed Fri  . calcium carbonate  2 tablet Oral Daily  . carvedilol  12.5 mg Oral BID WC  . ferrous sulfate  325 mg Oral Q breakfast  . gabapentin  300 mg Oral BID  . heparin  5,000 Units Subcutaneous Q8H  . hydrALAZINE  50 mg Oral TID  . isosorbide mononitrate  60 mg Oral QPM  . meclizine  25 mg Oral TID  . sodium chloride flush  3 mL Intravenous Q12H   Continuous Infusions: . sodium chloride       LOS: 5 days   Geradine Girt  [facility]   How to contact the [facility] Attending or Consulting provider Start[employer] or covering provider during after hours [place], for this patient?  1. Check the care team in [facility] and look for a) attending/consulting [facility] provider listed and b) the [facility] team listed 2. Log into [email or web address] and use [place]'s universal password to access. If you do not have the password, please contact the hospital operator. 3. Locate the [facility] provider you are looking for under [facility] and page to a number that you can be directly reached. 4. If you still have difficulty reaching the provider, please page the [facility] (Director on Call) for the Hospitalists listed on amion for assistance.  12/28/2019, 1:35 PM

## 2019-12-28 NOTE — Progress Notes (Signed)
SATURATION QUALIFICATIONS: (This note is used to comply with regulatory documentation for home oxygen)  Patient Saturations on Room Air at Rest = 96%  Patient Saturations on Room Air while Ambulating = 90%   

## 2019-12-28 NOTE — Consult Note (Signed)
[facility] Consult Note     Date: 12/28/2019                  Patient Name:  Melvin Mcclure  MRN: [id number]  DOB: 1951-01-30  Age / Sex: 69 y.o., male         PCP: Nolene Ebbs, MD                 Service Requesting Consult: [place].                 Reason for Consult: AKI on CKD            Chief Complaint: SHOB HPI:   Melvin Mcclure is a 69 y.o. male with a pPMH of diastolic HF, hypertension, diabetes mellitus type 2, CKD stage IV,andvertigo, who presented with progressive SHOB and admitted for acute decompensated HFpEF. Patient presented with a weight of 95 Kg and signs and symptoms concerning for hypervolemia including worsening exertional dyspnea and orthopnea. Patient was admitted back in June for CAP with echo showing preserved EF with grade II diastolic dysfunction. Patient was started on IV diuresis since admission with 10 kg weight loss and progressively worse urine output and kidney function.   On exam today, patient states that his [facility] has improved but limited by chest pain that appears to be MSK in nature. He is able to lay flat without shortness of breath.   Past Medical History:  Diagnosis Date  . CHF (congestive heart failure) ([place]) 05/28/2018   dx 05/28/18  . Diabetes mellitus without complication ([place])   . Hyperlipidemia   . Hypertension   . Perforating neurotrophic ulcer of foot ([place])     Past Surgical History:  Procedure Laterality Date  . ACHILLES TENDON REPAIR    . CYST EXCISION    . IR THORACENTESIS ASP PLEURAL SPACE W/IMG GUIDE  12/26/2019    Family History  Problem Relation Age of Onset  . Diabetes Mellitus II Mother   . Hypertension Father    Social History:  reports that he has never smoked. He has never used smokeless tobacco. He reports that he does not drink alcohol and does not use drugs.  Allergies: No Known Allergies  Medications Prior to Admission  Medication Sig Dispense Refill  . amLODipine (NORVASC) 10 MG  tablet Take 10 mg by mouth daily.    Marland Kitchen aspirin 81 MG tablet Take 81 mg by mouth daily.    . calcitRIOL (ROCALTROL) 0.25 MCG capsule Take 0.25 mcg by mouth 3 (three) times a week. Monday, Wednesday, Friday    . calcium carbonate (TUMS - DOSED IN MG ELEMENTAL CALCIUM) 500 MG chewable tablet Chew 2 tablets by mouth daily.     . ferrous sulfate 325 (65 FE) MG tablet Take 325 mg by mouth daily with breakfast.    . gabapentin (NEURONTIN) 300 MG capsule Take 300 mg by mouth 3 (three) times daily.    . hydrALAZINE (APRESOLINE) 50 MG tablet Take 1 tablet (50 mg total) by mouth every 8 (eight) hours. (Patient taking differently: Take 50 mg by mouth 3 (three) times daily. ) 90 tablet 0  . isosorbide mononitrate (IMDUR) 30 MG 24 hr tablet Take 30 mg by mouth every evening.    . meclizine (ANTIVERT) 25 MG tablet Take 25 mg by mouth 3 (three) times daily as needed for dizziness or nausea.     . metoprolol tartrate (LOPRESSOR) 25 MG tablet Take 25 mg by mouth 2 (two) times daily.    Marland Kitchen  senna-docusate (SENOKOT-S) 8.6-50 MG tablet Take 1 tablet by mouth 2 (two) times daily.    Marland Kitchen torsemide (DEMADEX) 20 MG tablet Take 1 tablet (20 mg total) by mouth every other day. (Patient taking differently: Take 20 mg by mouth daily. ) 30 tablet 0    Results for orders placed or performed during the hospital encounter of 12/23/19 (from the past 48 hour(s))  Lactate dehydrogenase (pleural or peritoneal fluid)     Status: Abnormal   Collection Time: 12/26/19  3:52 PM  Result Value Ref Range   LD, Fluid 100 (H) 3 - 23 U/L    Comment: (NOTE) Results should be evaluated in conjunction with serum values    Fluid Type-FLDH CYTO PLEU RIGHT     Comment: Performed at [facility], [place] [address] [place], [place] [address] CORRECTED ON 08/23 AT 4401: PREVIOUSLY REPORTED AS CYTO PLEU   Body fluid cell count with differential     Status: Abnormal   Collection Time: 12/26/19  3:52 PM  Result Value Ref Range   Fluid Type-FCT  CYTO PLEU RIGHT     Comment: CORRECTED ON 08/23 AT 1607: PREVIOUSLY REPORTED AS CYTO PLEU   Color, Fluid HAZY YELLOW   Appearance, Fluid YELLOW CLEAR   Total Nucleated Cell Count, Fluid 1,429 (H) 0 - 1,000 cu mm   Neutrophil Count, Fluid 1 0 - 25 %   Lymphs, Fluid 78 %   Monocyte-Macrophage-Serous Fluid 21 (L) 50 - 90 %   Eos, Fluid 0 %   Other Cells, Fluid MESOTHELIAL CELLS, PIGMENT LADEN MACROPHAGES %    Comment: Performed at [facility], [place] [address] [place], [place] [address]  Gram stain     Status: None   Collection Time: 12/26/19  3:52 PM   Specimen: PATH Cytology Pleural fluid  Result Value Ref Range   Specimen Description FLUID PLEURAL    Special Requests NONE    Gram Stain      ABUNDANT WBC PRESENT, PREDOMINANTLY MONONUCLEAR NO ORGANISMS SEEN Performed at [facility], [place] [address], [place], [place] [address]    Report Status 12/26/2019 FINAL   Protein, pleural or peritoneal fluid     Status: None   Collection Time: 12/26/19  3:52 PM  Result Value Ref Range   Total protein, fluid 3.6 g/dL    Comment: (NOTE) No normal range established for this test Results should be evaluated in conjunction with serum values    Fluid Type-FTP CYTO PLEU RIGHT     Comment: Performed at [place] [address] [place], [place] [address] CORRECTED ON 08/23 AT 2595: PREVIOUSLY REPORTED AS CYTO PLEU   Glucose, pleural or peritoneal fluid     Status: None   Collection Time: 12/26/19  3:52 PM  Result Value Ref Range   Glucose, Fluid 166 mg/dL    Comment: (NOTE) No normal range established for this test Results should be evaluated in conjunction with serum values    Fluid Type-FGLU CYTO PLEU RIGHT     Comment: Performed at [facility] [address], [place], [place] [address] CORRECTED ON 08/23 AT 1607: PREVIOUSLY REPORTED AS CYTO PLEU   PH, Body Fluid     Status: None   Collection Time: 12/26/19  3:52 PM  Result Value Ref Range   pH, Body Fluid  7.7 Not Estab.    Comment: (NOTE) This test was developed and its performance characteristics determined by [facility]. It has not been  cleared or approved by the Food and Drug Administration. The reference interval(s) and other method performance specifications have not been established for this body fluid. The test result must be integrated into the clinical context for interpretation. Performed At: [facility] [place], [place] [id number] Rush Farmer MD GE:[id number]    Source PLEURAL RIGHT     Comment: Performed at [facility], [place] [address] [place], [place] [address]  Culture, body fluid-bottle     Status: None (Preliminary result)   Collection Time: 12/26/19  3:52 PM   Specimen: Fluid  Result Value Ref Range   Specimen Description FLUID PLEURAL    Special Requests      BOTTLES DRAWN AEROBIC AND ANAEROBIC Blood Culture adequate volume   Culture      NO GROWTH 2 DAYS Performed at [facility], [place] [address], [place], [place] [address]    Report Status PENDING   Cytology - Non PAP;     Status: None   Collection Time: 12/26/19  3:52 PM  Result Value Ref Range   CYTOLOGY - NON GYN      CYTOLOGY - NON PAP CASE: [id number] PATIENT: [name] Non-Gynecological Cytology Report     Clinical History: None provided Specimen Submitted:  A. PLEURAL FLUID,RIGHT, THORACENTESIS:   FINAL MICROSCOPIC DIAGNOSIS: - Reactive mesothelial cells present - Numerous lymphoid cells - See comment  SPECIMEN ADEQUACY: Satisfactory for evaluation  DIAGNOSTIC COMMENTS: Dr. Jeannie Done reviewed the case and concurs with the diagnosis.  GROSS: Received is/are: 600cc's of amber colored fluid. (NM:nm) Smears: 0 Concentration Method (Thin Prep): 1 Cell Block: 1 Conventional Additional Studies: 2 Hematology slides labeled [id number]     Final Diagnosis performed by Jaquita Folds, MD.   Electronically signed 12/28/2019 Technical component  performed at [facility]. [facility], [place] [address] [place], [place] [address].  Professional component performed at [facility], [place] [address], [place], [place] [address].  Immunohistochemistry Tec hnical component (if applicable) was performed at [facility]. [address], [place], [place], [place] [address].   IMMUNOHISTOCHEMISTRY DISCLAIMER (if applicable): Some of these immunohistochemical stains may have been developed and the performance characteristics determine by [facility]. Some may not have been cleared or approved by the U.S. Food and Drug Administration. The FDA has determined that such clearance or approval is not necessary. This test is used for clinical purposes. It should not be regarded as investigational or for research. This laboratory is certified under the [place] (CLIA-88) as qualified to perform high complexity clinical laboratory testing.  The controls stained appropriately.   Glucose, capillary     Status: Abnormal   Collection Time: 12/26/19  4:01 PM  Result Value Ref Range   Glucose-Capillary 124 (H) 70 - 99 mg/dL    Comment: Glucose reference range applies only to samples taken after fasting for at least 8 hours.   Comment 1 Notify RN   Ferritin     Status: None   Collection Time: 12/26/19  7:40 PM  Result Value Ref Range   Ferritin 143 24 - 336 ng/mL    Comment: Performed at [facility], [place] [address] [place], [place] [address]  Iron and TIBC     Status: Abnormal   Collection Time: 12/26/19  7:40 PM  Result Value Ref Range   Iron 42 (L) 45 - 182 ug/dL   TIBC 221 (L) 250 - 450 ug/dL  Saturation Ratios 19 17.9 - 39.5 %   UIBC 179 ug/dL    Comment: Performed at [facility], [place] [address], [place],  [address]  Protein, total     Status: None   Collection Time: 12/26/19  7:40 PM  Result Value Ref Range   Total Protein 7.7  6.5 - 8.1 g/dL    Comment: Performed at [facility], [place] [address], [place], [place] [address]  Lactate dehydrogenase     Status: Abnormal   Collection Time: 12/26/19  7:40 PM  Result Value Ref Range   LDH 202 (H) 98 - 192 U/L    Comment: Performed at [facility], [place] [address] [place], [place] [address]  Glucose, capillary     Status: Abnormal   Collection Time: 12/26/19  9:05 PM  Result Value Ref Range   Glucose-Capillary 202 (H) 70 - 99 mg/dL    Comment: Glucose reference range applies only to samples taken after fasting for at least 8 hours.   Comment 1 Notify RN    Comment 2 Document in Chart   Basic metabolic panel     Status: Abnormal   Collection Time: 12/27/19  5:39 AM  Result Value Ref Range   Sodium 139 135 - 145 mmol/L   Potassium 4.0 3.5 - 5.1 mmol/L   Chloride 98 98 - 111 mmol/L   CO2 31 22 - 32 mmol/L   Glucose, Bld 121 (H) 70 - 99 mg/dL    Comment: Glucose reference range applies only to samples taken after fasting for at least 8 hours.   BUN 44 (H) 8 - 23 mg/dL   Creatinine, Ser 3.47 (H) 0.61 - 1.24 mg/dL   Calcium 7.7 (L) 8.9 - 10.3 mg/dL   GFR calc non Af Amer 17 (L) >60 mL/min   GFR calc Af Amer 20 (L) >60 mL/min   Anion gap 10 5 - 15    Comment: Performed at [place] [address], [place], [place] [address]  Glucose, capillary     Status: Abnormal   Collection Time: 12/27/19  6:18 AM  Result Value Ref Range   Glucose-Capillary 120 (H) 70 - 99 mg/dL    Comment: Glucose reference range applies only to samples taken after fasting for at least 8 hours.   Comment 1 Notify RN    Comment 2 Document in Chart   Glucose, capillary     Status: Abnormal   Collection Time: 12/27/19 11:10 AM  Result Value Ref Range   Glucose-Capillary 203 (H) 70 - 99 mg/dL    Comment: Glucose reference range applies only to samples taken after fasting for at least 8 hours.  Glucose, capillary     Status: Abnormal   Collection Time: 12/27/19  4:15 PM   Result Value Ref Range   Glucose-Capillary 213 (H) 70 - 99 mg/dL    Comment: Glucose reference range applies only to samples taken after fasting for at least 8 hours.  Glucose, capillary     Status: Abnormal   Collection Time: 12/27/19  9:40 PM  Result Value Ref Range   Glucose-Capillary 198 (H) 70 - 99 mg/dL    Comment: Glucose reference range applies only to samples taken after fasting for at least 8 hours.  Glucose, capillary     Status: Abnormal   Collection Time: 12/28/19  6:13 AM  Result Value Ref Range   Glucose-Capillary 162 (H) 70 - 99 mg/dL    Comment: Glucose  reference range applies only to samples taken after fasting for at least 8 hours.  Basic metabolic panel     Status: Abnormal   Collection Time: 12/28/19  9:32 AM  Result Value Ref Range   Sodium 136 135 - 145 mmol/L   Potassium 4.0 3.5 - 5.1 mmol/L   Chloride 96 (L) 98 - 111 mmol/L   CO2 30 22 - 32 mmol/L   Glucose, Bld 206 (H) 70 - 99 mg/dL    Comment: Glucose reference range applies only to samples taken after fasting for at least 8 hours.   BUN 55 (H) 8 - 23 mg/dL   Creatinine, Ser 3.83 (H) 0.61 - 1.24 mg/dL   Calcium 7.1 (L) 8.9 - 10.3 mg/dL   GFR calc non Af Amer 15 (L) >60 mL/min   GFR calc Af Amer 17 (L) >60 mL/min   Anion gap 10 5 - 15    Comment: Performed at [facility] [address] [place], [place] [address]  Glucose, capillary     Status: Abnormal   Collection Time: 12/28/19 11:04 AM  Result Value Ref Range   Glucose-Capillary 179 (H) 70 - 99 mg/dL    Comment: Glucose reference range applies only to samples taken after fasting for at least 8 hours.   Comment 1 Notify RN    DG Chest 1 View  Result Date: 12/26/2019 CLINICAL DATA:  Status post right-sided thoracentesis EXAM: CHEST  1 VIEW COMPARISON:  12/23/2019 FINDINGS: Interval decrease in right-sided pleural effusion status post thoracentesis. Mild hazy right basilar opacity, significantly improved from prior. Left lung remains clear.  No pneumothorax. Stable heart size. IMPRESSION: 1. Decreased right-sided pleural effusion status post thoracentesis. No pneumothorax. 2. Improved aeration of the right lung base. Electronically Signed   By: Davina Poke D.O.   On: 12/26/2019 16:14   NM CARDIAC AMYLOID TUMOR LOC INFLAM SPECT 1 DAY  Result Date: 12/26/2019 CLINICAL DATA:  HEART FAILURE. CONCERN FOR CARDIAC AMYLOIDOSIS. EXAM: NUCLEAR MEDICINE TUMOR LOCALIZATION. PYP CARDIAC AMYLOIDOSIS SCAN WITH SPECT TECHNIQUE: Following intravenous administration of radiopharmaceutical, anterior planar images of the chest were obtained. Regions of interest were placed on the heart and contralateral chest wall for quantitative assessment. Additional SPECT imaging of the chest was obtained. RADIOPHARMACEUTICALS:  8 mCi TECHNETIUM 99 PYROPHOSPHATE FINDINGS: Planar Visual assessment: Anterior planar imaging demonstrates radiotracer uptake within the heart less than uptake within the adjacent ribs (Grade 1). Quantitative assessment : Quantitative assessment of the cardiac uptake compared to the contralateral chest wall is equal to 1.1 (H/CL = 1.1). SPECT assessment: SPECT imaging of the chest demonstrates minimal radiotracer accumulation within the LEFT ventricle. IMPRESSION: Visual and quantitative assessment (grade 1, H/CLL equal 1.1) are NOT suggestive of transthyretin amyloidosis. Electronically Signed   By: Suzy Bouchard M.D.   On: 12/26/2019 16:57   VAS US RENAL ARTERY DUPLEX  Result Date: 12/27/2019 ABDOMINAL VISCERAL Indications: Hypertension. High Risk Factors: Hyperlipidemia, Diabetes, coronary artery disease. Other Factors: Stage IV chronic kidney disease. Comparison Study: No prior. Performing Technologist: Oda Cogan RDMS, RVT  Examination Guidelines: A complete evaluation includes B-mode imaging, spectral Doppler, color Doppler, and power Doppler as needed of all accessible portions of each vessel. Bilateral testing is considered an  integral part of a complete examination. Limited examinations for reoccurring indications may be performed as noted.  Duplex Findings: +--------------------+--------+--------+------+--------+ Mesenteric          PSV cm/sEDV cm/sPlaqueComments +--------------------+--------+--------+------+--------+ Aorta Mid  92                          +--------------------+--------+--------+------+--------+ Celiac Artery Origin  269                          +--------------------+--------+--------+------+--------+ SMA Proximal          222                          +--------------------+--------+--------+------+--------+    +------------------+--------+--------+-------+ Right Renal ArteryPSV cm/sEDV cm/sComment +------------------+--------+--------+-------+ Origin              123      16           +------------------+--------+--------+-------+ Proximal            122      17           +------------------+--------+--------+-------+ Mid                  58      13           +------------------+--------+--------+-------+ Distal               59      12           +------------------+--------+--------+-------+ +-----------------+--------+--------+-------+ Left Renal ArteryPSV cm/sEDV cm/sComment +-----------------+--------+--------+-------+ Origin              69      10           +-----------------+--------+--------+-------+ Proximal            72      10           +-----------------+--------+--------+-------+ Mid                 46      9            +-----------------+--------+--------+-------+ Distal              42      10           +-----------------+--------+--------+-------+ +------------+--------+--------+----+-----------+--------+--------+----+ Right KidneyPSV cm/sEDV cm/sRI  Left KidneyPSV cm/sEDV cm/sRI   +------------+--------+--------+----+-----------+--------+--------+----+ Upper Pole  23      6       0.76Upper Pole 27       7       0.74 +------------+--------+--------+----+-----------+--------+--------+----+ Mid         29      6       0.81Mid        30      6       0.81 +------------+--------+--------+----+-----------+--------+--------+----+ Lower Pole  24                  Lower Pole 26      8       0.69 +------------+--------+--------+----+-----------+--------+--------+----+ Hilar       45      8       0.82Hilar      24      7       0.73 +------------+--------+--------+----+-----------+--------+--------+----+ +------------------+-----+------------------+-----+ Right Kidney           Left Kidney             +------------------+-----+------------------+-----+ RAR                    RAR                     +------------------+-----+------------------+-----+  RAR (manual)      1.3  RAR (manual)      0.78  +------------------+-----+------------------+-----+ Cortex                 Cortex                  +------------------+-----+------------------+-----+ Cortex thickness       Corex thickness         +------------------+-----+------------------+-----+ Kidney length (cm)11.40Kidney length (cm)10.07 +------------------+-----+------------------+-----+  Summary: Renal:  Right: No evidence of right renal artery stenosis. Abnormal right        Resistive Index. Left:  No evidence of left renal artery stenosis. Abnormal left        Resisitve Index.  *See table(s) above for measurements and observations.  Diagnosing physician: Harold Barban MD  Electronically signed by Harold Barban MD on 12/27/2019 at 5:40:10 PM.    Final    IR THORACENTESIS ASP PLEURAL SPACE W/IMG GUIDE  Result Date: 12/26/2019 INDICATION: Patient with history of CHF, now with right pleural effusion. Request is made for diagnostic and therapeutic thoracentesis. EXAM: ULTRASOUND GUIDED DIAGNOSTIC AND THERAPEUTIC RIGHT THORACENTESIS MEDICATIONS: 10 mL 1% lidocaine COMPLICATIONS: None immediate. PROCEDURE: An ultrasound  guided thoracentesis was thoroughly discussed with the patient and questions answered. The benefits, risks, alternatives and complications were also discussed. The patient understands and wishes to proceed with the procedure. Written consent was obtained. Ultrasound was performed to localize and mark an adequate pocket of fluid in the right chest. The area was then prepped and draped in the normal sterile fashion. 1% Lidocaine was used for local anesthesia. Under ultrasound guidance a 6 Fr Safe-T-Centesis catheter was introduced. Thoracentesis was performed. The catheter was removed and a dressing applied. FINDINGS: A total of approximately 700 mL of amber fluid was removed. Samples were sent to the laboratory as requested by the clinical team. IMPRESSION: Successful ultrasound guided diagnostic and therapeutic right thoracentesis yielding 700 mL of pleural fluid. Read by: Brynda Greathouse PA-C No pneumothorax on follow-up radiograph. Electronically Signed   By: Lucrezia Europe M.D.   On: 12/26/2019 15:47    ROS - negative except for what is noted on HPI  Blood pressure (!) 161/81, pulse 78, temperature 98 F (36.7 C), temperature source Oral, resp. rate 16, height 5\' 10"  (1.778 m), weight 85.3 kg, SpO2 95 %. Physical Exam Constitutional:      Appearance: He is obese. He is not ill-appearing or diaphoretic.  HENT:     Head: Normocephalic and atraumatic.  Eyes:     Extraocular Movements: Extraocular movements intact.  Cardiovascular:     Rate and Rhythm: Normal rate and regular rhythm.     Pulses: Normal pulses.     Heart sounds: Normal heart sounds.  Pulmonary:     Effort: Pulmonary effort is normal. No respiratory distress.  Abdominal:     General: Bowel sounds are normal. There is no distension.     Tenderness: There is no abdominal tenderness.  Musculoskeletal:        General: Normal range of motion.     Cervical back: Normal range of motion.     Right lower leg: No edema.     Left lower leg:  No edema.  Skin:    General: Skin is dry.  Neurological:     General: No focal deficit present.     Mental Status: He is alert.      Assessment/Plan 1. Acute on Chronic Kidney Disease (Stage IV) - Patient presented wit acute  worsening of kidney function in the setting of CHF exacerbation. He has a baseline of Cr 2.95 (GFR 24) that acutely worsened with diurese to 3.83 (GFR 17). Patient initially had good urine output that slowly tapered off and has lost roughly 10 Kg since admission (95.5>85.3 kg). Renal US was WNL. Patient's worsening kidney function likely due to prolonged diuretic use. Will re-evaluated once urine labs have been drawn.  - Stop diuretics at this time. - Strict I&O  - Avoid nephrotoxic medications - Urine Na pending - UA pending   2. Hypocalcemia - likely secondary to lasix. Will improve off of medication. Can continue calcium carbonate at [facility] time.   3. Right pleural effusion - s/p thoracentesis showing transudative effusion.   4. HTN - patient has been normotensive. Will consider restarting non-nephrotoxic HTN meds if his pressure increases.   5. Type 2 DM: - Management per the primary team.   6. Acute decompensated HFpEF:  - Patient has grade II diastolic dysfunction s/p IV diuresis  - 10 Kg of fluid weight diuresed     Marianna Payment, D.O. [facility], PGY-2 Pager: [phone], Phone: [phone] Date 12/28/2019 Time 3:44 PM

## 2019-12-28 NOTE — Progress Notes (Signed)
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  He reports visual doubling and spinning, near falls, (+) tinnitis, (+) R ear hearing loss (progressive and did not seem to align with timing of when his vertigo started).  He reports the daily meclizine has helped his sx significantly, however, we spoke about how that was treating the symptoms and not the true cause.  I encouraged him to follow up with an ENT who specializes in vertigo when his more pressing health issues stabilize (CHF and kidney).  He is agreeable to follow up with outpatient therapy again and I encouraged him to tell them that the Epley's did not work last time.  He did not want me to assess his vestibular system today as he was just finishing eating (and did not want to get nauseated and vomit).  His RN preformed ambulatory sat testing earlier today, so that was completed as well.  I will have someone stop and check in with him tomorrow who specializes in vertigo prior to d/c to make sure he did not want to be assessed while still here.  Verdene Lennert, PT, DPT  Acute Rehabilitation [phone] pager [phone] office

## 2019-12-29 LAB — BASIC METABOLIC PANEL
Anion gap: 9 (ref 5–15)
BUN: 57 mg/dL — ABNORMAL HIGH (ref 8–23)
CO2: 29 mmol/L (ref 22–32)
Calcium: 7.1 mg/dL — ABNORMAL LOW (ref 8.9–10.3)
Chloride: 97 mmol/L — ABNORMAL LOW (ref 98–111)
Creatinine, Ser: 3.85 mg/dL — ABNORMAL HIGH (ref 0.61–1.24)
GFR calc Af Amer: 17 mL/min — ABNORMAL LOW (ref >60)
GFR calc non Af Amer: 15 mL/min — ABNORMAL LOW (ref >60)
Glucose, Bld: 244 mg/dL — ABNORMAL HIGH (ref 70–99)
Potassium: 4.1 mmol/L (ref 3.5–5.1)
Sodium: 135 mmol/L (ref 135–145)

## 2019-12-29 LAB — GLUCOSE, CAPILLARY
Glucose-Capillary: 157 mg/dL — ABNORMAL HIGH (ref 70–99)
Glucose-Capillary: 230 mg/dL — ABNORMAL HIGH (ref 70–99)
Glucose-Capillary: 182 mg/dL — ABNORMAL HIGH (ref 70–99)
Glucose-Capillary: 200 mg/dL — ABNORMAL HIGH (ref 70–99)

## 2019-12-29 LAB — HEMOGLOBIN A1C
Hgb A1c MFr Bld: 6.6 % — ABNORMAL HIGH (ref 4.8–5.6)
Mean Plasma Glucose: 142.72 mg/dL

## 2019-12-29 LAB — PHOSPHORUS: Phosphorus: 4.9 mg/dL — ABNORMAL HIGH (ref 2.5–4.6)

## 2019-12-29 MED ORDER — DICLOFENAC SODIUM 1 % EX GEL
2.0000 g | Freq: Four times a day (QID) | CUTANEOUS | Status: DC
  Administered 2019-12-29 – 2019-12-31 (×9): 2 g via TOPICAL
  Filled 2019-12-29 (×2): qty 100

## 2019-12-29 MED ORDER — INSULIN ASPART 100 UNIT/ML ~~LOC~~ SOLN
0.0000 [IU] | Freq: Three times a day (TID) | SUBCUTANEOUS | Status: DC
  Administered 2019-12-29: 1 [IU] via SUBCUTANEOUS
  Administered 2019-12-30: 2 [IU] via SUBCUTANEOUS
  Administered 2019-12-30: 1 [IU] via SUBCUTANEOUS
  Administered 2019-12-31: 2 [IU] via SUBCUTANEOUS
  Administered 2019-12-31: 1 [IU] via SUBCUTANEOUS

## 2019-12-29 MED ORDER — INSULIN ASPART 100 UNIT/ML ~~LOC~~ SOLN: 0.0000 [IU] | Freq: Every day | SUBCUTANEOUS | Status: DC

## 2019-12-29 NOTE — Progress Notes (Signed)
Physical Therapy Treatment Patient Details Name: Melvin Mcclure MRN: [id number] DOB: Dec 23, 1950 Today's Date: 12/29/2019    History of Present Illness The pt is a 69 yo male presenting from home with worsening SOB. Following workup, pt found to have acute exacerbation of diastolic CHF. PMH includes: recent hospitalization for CHF exacerbation and CAP (6/25 - 7/4), HTN, DM II, CKD IV, and vertigo.    PT Comments    Patient progressing with ambulation and able to demonstrate ambulation on RA with SpO2 91%.  He did discuss vertigo symptoms from the past, but feels he is not in exacerbation of this right now.  Does feel it is coincidental that hearing is affected as well so reinforced recommendation for ENT evaluation.  Patient not interested in vestibular eval at this time.  Recommend ENT decide if he needs follow up vestibular rehab at this time.    Follow Up Recommendations  No PT follow up     Equipment Recommendations  None recommended by PT    Recommendations for Other Services       Precautions / Restrictions Precautions Precautions: Fall Precaution Comments: mild fall risk, watch SpO2    Mobility  Bed Mobility Overal bed mobility: Independent                Transfers Overall transfer level: Modified independent Equipment used: None             General transfer comment: stood while I left to get O2 tank  Ambulation/Gait Ambulation/Gait assistance: Supervision;Min guard Gait Distance (Feet): 150 Feet (x 2) Assistive device: None Gait Pattern/deviations: Step-through pattern;Decreased dorsiflexion - left;Decreased dorsiflexion - right;Trunk flexed     General Gait Details: ambulated with O2 initially, SpO2 94%, then on RA, SpO2 91%; on RA at rest SpO2 95%.  Noted patient with decreased foot clearance, reports due to slipper socks on in his shoes, one episode of catching his toe with supervision/minguard for recovery   Stairs             Wheelchair  Mobility    Modified Rankin (Stroke Patients Only)       Balance Overall balance assessment: Mild deficits observed, not formally tested                                          Cognition Arousal/Alertness: Awake/alert Behavior During Therapy: WFL for tasks assessed/performed Overall Cognitive Status: Within Functional Limits for tasks assessed                                        Exercises      General Comments General comments (skin integrity, edema, etc.): Discussed his vertigo, reports 3-4 episodes over 2 years last of which was little over a month ago.  States has spinning and double vision lasting about 30 minutes, then time to recover with disorientation lasting longer; sometimes with N&V reports he feels Meclizine helps and he lays on R side to recover as well, feels at times rolling to L side exacerbates.  R hearing is worse than L and he feels it is possibly connected to vertigo.  Highly encouraged ENT evaluation and possible return to vestibular rehab if MD feels it is appropriate.      Pertinent Vitals/Pain Pain Score: 5  Pain Location: chest Pain Descriptors /  Indicators: Sore Pain Intervention(s): Monitored during session    Home Living                      Prior Function            PT Goals (current goals can now be found in the care plan section) Progress towards PT goals: Progressing toward goals    Frequency    Min 3X/week      PT Plan Current plan remains appropriate;Discharge plan needs to be updated    Co-evaluation              AM-PAC PT "6 Clicks" Mobility   Outcome Measure  Help needed turning from your back to your side while in a flat bed without using bedrails?: None Help needed moving from lying on your back to sitting on the side of a flat bed without using bedrails?: None Help needed moving to and from a bed to a chair (including a wheelchair)?: None Help needed standing up from  a chair using your arms (e.g., wheelchair or bedside chair)?: None Help needed to walk in hospital room?: None Help needed climbing 3-5 steps with a railing? : A Little 6 Click Score: 23    End of Session Equipment Utilized During Treatment: Oxygen Activity Tolerance: Patient tolerated treatment well Patient left: in bed   PT Visit Diagnosis: Difficulty in walking, not elsewhere classified (R26.2)     Time: 8616-8372 PT Time Calculation (min) (ACUTE ONLY): 28 min  Charges:  $Gait Training: 8-22 mins $Therapeutic Activity: 8-22 mins                     Magda Kiel, PT Acute Rehabilitation Services Pager:[phone] Office:[phone] 12/29/2019    [name] 12/29/2019, 1:00 PM

## 2019-12-29 NOTE — Progress Notes (Signed)
Progress Note    Melvin Mcclure  KZS:[id number] DOB: 1950/10/23  DOA: 12/23/2019 PCP: Nolene Ebbs, MD    Brief Narrative:     Medical records reviewed and are as summarized below:  Melvin Mcclure is an 69 y.o. male with medical history significant of diastolic HF, hypertension, diabetes mellitus type 2, CKD stage IV, and vertigo, presents with complaints of shortness of breath this morning.  Patient had just recently been hospitalized from 6/25-7/4 for CHF exacerbation with community-acquired pneumonia.  Over the last week or so he would be getting more short of breath even walking short distances.  His wife notes that he was put back on oxygen due to his symptoms.  He had reportedly been intermittently wheezing and not sleeping well due to the shortness of breath  Assessment/Plan:   Principal Problem:   Acute exacerbation of CHF (congestive heart failure) (HCC) Active Problems:   CKD stage 4 due to type 2 diabetes mellitus (HCC)   Hypocalcemia   Anemia of chronic disease   Vertigo   Acute on chronic respiratory failure with hypoxia (HCC)    Acute hypoxic respiratory failure Acute on chronic diastolic congestive heart failure Diastolic congestive heart failure exacerbation: Acute on chronic. On chest x-ray showing cardiomegaly with pulmonary vascular congestion and bilateral interstitial prominence suggestive of CHF with her right-sided pleural effusion. Initial O2 saturations noted to be around 84% on room improved with 3 L nasal cannula oxygen. Last EF noted to be 55 to 60% by echocardiogram on 10/29/2019.At his last discharge his dry weight was noted to be 87 kg, but he presents today at 95.5 kg.Patient had been given 80 mg of Lasix IV in ED. --Cardiology consulted and have signed off --unfortunately no urine was recorded yesterday --Cardiology discontinued furosemide and metolazone 8/24 --Continue supplemental oxygen, maintain SPO2 greater than 92% currently on 1 L  nasal cannula with SPO2 94% --Fluid restriction 1200 mL/day --Strict I's and O's and daily weights: if weight is accurate, he is down from 95kg to 85 kg this hospitalization  Chest pain, atypical Patient complaining of right-sided chest pain, onset after significant coughing spells over the past 3 days.  Pain seems to be reproducible with palpation over right chest area.  No previous ischemic work-ups in the past. Cardiology recommends outpatient stress test. -trial of voltaren gel  Right pleural effusion Patient with moderate right-sided pleural effusion.  Underwent thoracentesis by IR on 12/26/2019 with 700 mL fluid removed.  Lights criteria consistent with transudate of effusion. --Continue supplemental oxygen, titrate to maintain SPO2 greater than 92% -home O2 study  Essential hypertension --Continue amlodipine 10 mg daily --hydralazine 50 mg TID --metoprolol 25 mg BID --isosorbide increased to 60 mg p.o. qHS  Acute on Chronic kidney disease stage IV Patient baseline creatinine previously noted to be around 2.7.  -Patient followedin the outpatient setting by Dr. Royce Macadamia of [facility]. -Records note a prior history of cardiorenal syndrome.   -Renal ultrasound with no evidence of hydronephrosis, echogenic renal parenchyma bilaterally consistent with medical renal disease.  Renal ultrasound consistent with medical renal disease.  Renal artery ultrasound negative for significant stenosis. --Discontinued Lasix and metolazone on 8/24 --nephrology consult appreciated as Cr elevated   Type 2 diabetes mellitus Hemoglobin A1c 7.1 10/29/2019.  -SSI --monitor CBGs before every meal/at bedtime --Carbohydrate modified diet  Anemia of chronic disease:  Hemoglobin 11.2 which appears improved from previous.  --Continue oral iron supplementation  Hypocalcemia: Acute on chronic.  On admission calcium level noted  to be 7.6.  8.2 this morning --Continue calcium  carbonate  Vertigo: Chronic.  Patient had been started on meclizine TID with some improvement in symptoms.  --Continue scheduled meclizine   Family Communication/Anticipated D/C date and plan/Code Status   DVT prophylaxis: heparin Code Status: Full Code.  Disposition Plan: Status is: Inpatient  Remains inpatient appropriate because:Inpatient level of care appropriate due to severity of illness   Dispo:  Patient From: Home  Planned Disposition: Home  Expected discharge date: 12/30/19  Medically stable for discharge: No          Medical Consultants:    Cards  renal     Subjective:   Still having some pain on his right chest wall  Objective:    Vitals:   12/28/19 1209 12/28/19 1800 12/28/19 2300 12/29/19 1240  BP: (!) 161/81 134/68 (!) 142/76 135/72  Pulse: 78 72 74 70  Resp: 16 16 16 16   Temp: 98 F (36.7 C) 97.8 F (36.6 C) 98.8 F (37.1 C) 98.3 F (36.8 C)  TempSrc: Oral Oral Oral Oral  SpO2: 95% 98% 96% 96%  Weight:      Height:        Intake/Output Summary (Last 24 hours) at 12/29/2019 1439 Last data filed at 12/29/2019 8676 Gross per 24 hour  Intake 720 ml  Output 150 ml  Net 570 ml   Filed Weights   12/24/19 0621 12/25/19 0500 12/28/19 0610  Weight: 94.9 kg 94.1 kg 85.3 kg    Exam:  General: Appearance:     Overweight male in no acute distress     Lungs:      respirations unlabored  Heart:    Normal heart rate. Normal rhythm. No murmurs, rubs, or gallops.   MS:   All extremities are intact.   Neurologic:   Awake, alert, oriented x 3. No apparent focal neurological           defect.     Data Reviewed:   I have personally reviewed following labs and imaging studies:  Labs: Labs show the following:   Basic Metabolic Panel: Recent Labs  Lab 12/26/19 0829 12/26/19 0829 12/27/19 0539 12/27/19 0539 12/28/19 0932 12/28/19 0932 12/28/19 1508 12/29/19 0044  NA 141  --  139  --  136  --  136 135  K 4.2   < > 4.0   < >  4.0   < > 4.2 4.1  CL 99  --  98  --  96*  --  95* 97*  CO2 27  --  31  --  30  --  28 29  GLUCOSE 170*  --  121*  --  206*  --  268* 244*  BUN 39*  --  44*  --  55*  --  56* 57*  CREATININE 3.16*  --  3.47*  --  3.83*  --  3.89* 3.85*  CALCIUM 8.2*  --  7.7*  --  7.1*  --  7.3* 7.1*  PHOS  --   --   --   --   --   --   --  4.9*   < > = values in this interval not displayed.   GFR Estimated Creatinine Clearance: 18.4 mL/min (A) (by C-G formula based on SCr of 3.85 mg/dL (H)). Liver Function Tests: Recent Labs  Lab 12/23/19 0636 12/26/19 1940 12/28/19 1508  AST 21  --  23  ALT 24  --  17  ALKPHOS 98  --  80  BILITOT 0.9  --  0.6  PROT 7.5 7.7 7.5  ALBUMIN 3.5  --  3.2*   No results for input(s): LIPASE, AMYLASE in the last 168 hours. No results for input(s): AMMONIA in the last 168 hours. Coagulation profile No results for input(s): INR, PROTIME in the last 168 hours.  CBC: Recent Labs  Lab 12/23/19 0636  WBC 8.3  NEUTROABS 5.4  HGB 11.2*  HCT 37.5*  MCV 90.6  PLT 277   Cardiac Enzymes: No results for input(s): CKTOTAL, CKMB, CKMBINDEX, TROPONINI in the last 168 hours. BNP (last 3 results) No results for input(s): PROBNP in the last 8760 hours. CBG: Recent Labs  Lab 12/28/19 1104 12/28/19 1623 12/28/19 2056 12/29/19 0641 12/29/19 1108  GLUCAP 179* 241* 200* 157* 230*   D-Dimer: No results for input(s): DDIMER in the last 72 hours. Hgb A1c: Recent Labs    12/29/19 1220  HGBA1C 6.6*   Lipid Profile: No results for input(s): CHOL, HDL, LDLCALC, TRIG, CHOLHDL, LDLDIRECT in the last 72 hours. Thyroid function studies: No results for input(s): TSH, T4TOTAL, T3FREE, THYROIDAB in the last 72 hours.  Invalid input(s): FREET3 Anemia work up: Recent Labs    12/26/19 1940  FERRITIN 143  TIBC 221*  IRON 42*   Sepsis Labs: Recent Labs  Lab 12/23/19 0636  WBC 8.3    Microbiology Recent Results (from the past 240 hour(s))  SARS Coronavirus 2 by RT  PCR (hospital order, performed in [facility] hospital lab) Nasopharyngeal Nasopharyngeal Swab     Status: None   Collection Time: 12/23/19 10:59 AM   Specimen: Nasopharyngeal Swab  Result Value Ref Range Status   SARS Coronavirus 2 NEGATIVE NEGATIVE Final    Comment: (NOTE) SARS-CoV-2 target nucleic acids are NOT DETECTED.  The SARS-CoV-2 RNA is generally detectable in upper and lower respiratory specimens during the acute phase of infection. The lowest concentration of SARS-CoV-2 viral copies this assay can detect is 250 copies / mL. A negative result does not preclude SARS-CoV-2 infection and should not be used as the sole basis for treatment or other patient management decisions.  A negative result may occur with improper specimen collection / handling, submission of specimen other than nasopharyngeal swab, presence of viral mutation(s) within the areas targeted by this assay, and inadequate number of viral copies (<250 copies / mL). A negative result must be combined with clinical observations, patient history, and epidemiological information.  Fact Sheet for Patients:   StrictlyIdeas.no  Fact Sheet for Healthcare Providers: BankingDealers.co.za  This test is not yet approved or  cleared by the [place] FDA and has been authorized for detection and/or diagnosis of SARS-CoV-2 by FDA under an Emergency Use Authorization (EUA).  This EUA will remain in effect (meaning this test can be used) for the duration of the COVID-19 declaration under Section 564(b)(1) of the Act, 21 U.S.C. section 360bbb-3(b)(1), unless the authorization is terminated or revoked sooner.  Performed at [facility], [place] [address] [place], [place] [address]   Gram stain     Status: None   Collection Time: 12/26/19  3:52 PM   Specimen: PATH Cytology Pleural fluid  Result Value Ref Range Status   Specimen Description FLUID PLEURAL  Final    Special Requests NONE  Final   Gram Stain   Final    ABUNDANT WBC PRESENT, PREDOMINANTLY MONONUCLEAR NO ORGANISMS SEEN Performed at [facility], [place] [address] [place],  [address]    Report  Status 12/26/2019 FINAL  Final  Culture, body fluid-bottle     Status: None (Preliminary result)   Collection Time: 12/26/19  3:52 PM   Specimen: Fluid  Result Value Ref Range Status   Specimen Description FLUID PLEURAL  Final   Special Requests   Final    BOTTLES DRAWN AEROBIC AND ANAEROBIC Blood Culture adequate volume   Culture   Final    NO GROWTH 3 DAYS Performed at [facility], [address] [place], [place] [address]    Report Status PENDING  Incomplete    Procedures and diagnostic studies:  No results found.  Medications:   . amLODipine  10 mg Oral Daily  . aspirin EC  81 mg Oral Daily  . calcitRIOL  0.25 mcg Oral Once per day on Mon Wed Fri  . calcium carbonate  2 tablet Oral Daily  . carvedilol  12.5 mg Oral BID WC  . diclofenac Sodium  2 g Topical QID  . ferrous sulfate  325 mg Oral Q breakfast  . gabapentin  300 mg Oral BID  . heparin  5,000 Units Subcutaneous Q8H  . hydrALAZINE  50 mg Oral TID  . insulin aspart  0-5 Units Subcutaneous QHS  . insulin aspart  0-6 Units Subcutaneous TID WC  . isosorbide mononitrate  60 mg Oral QPM  . meclizine  25 mg Oral TID  . sodium chloride flush  3 mL Intravenous Q12H   Continuous Infusions: . sodium chloride       LOS: 6 days   Geradine Girt  [facility]   How to contact the [facility] Attending or Consulting provider [place] or covering provider during after hours [place], for this patient?  1. Check the care team in [facility] and look for a) attending/consulting [facility] provider listed and b) the [facility] team listed 2. Log into [email or web address] and use [place]'s universal password to access. If you do not have the password, please contact the hospital operator. 3. Locate the [facility] provider you are looking for under  [facility] and page to a number that you can be directly reached. 4. If you still have difficulty reaching the provider, please page the [facility] (Director on Call) for the Hospitalists listed on amion for assistance.  12/29/2019, 2:39 PM

## 2019-12-29 NOTE — Progress Notes (Signed)
[facility] NEPHROLOGY PROGRESS NOTE  Assessment/ Plan: Pt is a 69 y.o. yo male  with history of hypertension, DM, diastolic CHF, CKD stage IV follows at [place] [facility], anemia on outpatient IV iron and ESA injection, admitted on 12/23/2019 for CHF exacerbation.  The creatinine level was 2.91 on admission which is close to his baseline.  He was treated with IV Lasix bid in addition to metolazone with significant diuresis.  He lost around 10 kg since admission however the creatinine level continued to go up to 3.89 and BUN level 56.  We are consulted to manage AKI on CKD and volume management.  He was on torsemide 20 mg at home.  #Acute kidney injury on CKD stage IV, nonoliguric: Likely due to overdiuresis.  He lost 10 kg in 3 days.  UA with chronic proteinuria probably because of diabetes, no RBCs. Kidney ultrasound without obstruction. Received a 500 cc of NS yesterday with stable BUN/creatinine level.  He has no lower extremity edema.  Plan to hold diuretics for the next 2 days and then resume torsemide 40 mg p.o. daily as outpatient. Strict ins and outs, daily weight monitoring.  Avoid hypotensive episode or nephrotoxic medication.  #CHF exacerbation: Seen by cardiology, treated with IV diuretics.  He was on torsemide 20 mg at home, he will need higher dose of torsemide on discharge.  For now holding diuretics because of AKI.  #Anemia of CKD: Received 1 dose of IV iron as outpatient.  Hemoglobin at goal.  #CKD MBD: Phosphorus level 4.9. He is on calcitriol.  #Hypertension: BP variable.  Volume status discussed as above.  Continue current antihypertensive medication.  #Acute hypoxic respiratory failure: Clinically improved.    He was not wearing oxygen today.  Discussed with the primary team.  Subjective: Seen and examined at bedside.  Sitting on bed comfortable.  Not using oxygen.  Denies nausea vomiting or shortness of breath.  Urine output recorded only 325 cc unknown  if it is exact measurement.  Objective Vital signs in last 24 hours: Vitals:   12/28/19 0915 12/28/19 1209 12/28/19 1800 12/28/19 2300  BP: 136/80 (!) 161/81 134/68 (!) 142/76  Pulse: 77 78 72 74  Resp:  16 16 16   Temp: 98.4 F (36.9 C) 98 F (36.7 C) 97.8 F (36.6 C) 98.8 F (37.1 C)  TempSrc: Oral Oral Oral Oral  SpO2: 97% 95% 98% 96%  Weight:      Height:       Weight change:   Intake/Output Summary (Last 24 hours) at 12/29/2019 1051 Last data filed at 12/29/2019 0258 Gross per 24 hour  Intake 960 ml  Output 325 ml  Net 635 ml       Labs: Basic Metabolic Panel: Recent Labs  Lab 12/28/19 0932 12/28/19 1508 12/29/19 0044  NA 136 136 135  K 4.0 4.2 4.1  CL 96* 95* 97*  CO2 30 28 29   GLUCOSE 206* 268* 244*  BUN 55* 56* 57*  CREATININE 3.83* 3.89* 3.85*  CALCIUM 7.1* 7.3* 7.1*  PHOS  --   --  4.9*   Liver Function Tests: Recent Labs  Lab 12/23/19 0636 12/26/19 1940 12/28/19 1508  AST 21  --  23  ALT 24  --  17  ALKPHOS 98  --  80  BILITOT 0.9  --  0.6  PROT 7.5 7.7 7.5  ALBUMIN 3.5  --  3.2*   No results for input(s): LIPASE, AMYLASE in the last 168 hours. No results for input(s):  AMMONIA in the last 168 hours. CBC: Recent Labs  Lab 12/23/19 0636  WBC 8.3  NEUTROABS 5.4  HGB 11.2*  HCT 37.5*  MCV 90.6  PLT 277   Cardiac Enzymes: No results for input(s): CKTOTAL, CKMB, CKMBINDEX, TROPONINI in the last 168 hours. CBG: Recent Labs  Lab 12/28/19 0613 12/28/19 1104 12/28/19 1623 12/28/19 2056 12/29/19 0641  GLUCAP 162* 179* 241* 200* 157*    Iron Studies:  Recent Labs    12/26/19 1940  IRON 42*  TIBC 221*  FERRITIN 143   Studies/Results: No results found.  Medications: Infusions: . sodium chloride      Scheduled Medications: . amLODipine  10 mg Oral Daily  . aspirin EC  81 mg Oral Daily  . calcitRIOL  0.25 mcg Oral Once per day on Mon Wed Fri  . calcium carbonate  2 tablet Oral Daily  . carvedilol  12.5 mg Oral BID WC   . ferrous sulfate  325 mg Oral Q breakfast  . gabapentin  300 mg Oral BID  . heparin  5,000 Units Subcutaneous Q8H  . hydrALAZINE  50 mg Oral TID  . isosorbide mononitrate  60 mg Oral QPM  . meclizine  25 mg Oral TID  . sodium chloride flush  3 mL Intravenous Q12H    have reviewed scheduled and prn medications.  Physical Exam: General:NAD, comfortable Heart:RRR, s1s2 nl, no rubs Lungs:clear b/l, no crackle Abdomen:soft, Non-tender, non-distended Extremities:No LE edema Neurology: Alert awake and following commands  Harley Mccartney Tanna Furry 12/29/2019,10:51 AM  LOS: 6 days  Pager: [phone]

## 2019-12-30 LAB — RENAL FUNCTION PANEL
Albumin: 3 g/dL — ABNORMAL LOW (ref 3.5–5.0)
Anion gap: 10 (ref 5–15)
BUN: 67 mg/dL — ABNORMAL HIGH (ref 8–23)
CO2: 28 mmol/L (ref 22–32)
Calcium: 7 mg/dL — ABNORMAL LOW (ref 8.9–10.3)
Chloride: 99 mmol/L (ref 98–111)
Creatinine, Ser: 3.99 mg/dL — ABNORMAL HIGH (ref 0.61–1.24)
GFR calc Af Amer: 17 mL/min — ABNORMAL LOW (ref >60)
GFR calc non Af Amer: 14 mL/min — ABNORMAL LOW (ref >60)
Glucose, Bld: 168 mg/dL — ABNORMAL HIGH (ref 70–99)
Phosphorus: 5.6 mg/dL — ABNORMAL HIGH (ref 2.5–4.6)
Potassium: 4.1 mmol/L (ref 3.5–5.1)
Sodium: 137 mmol/L (ref 135–145)

## 2019-12-30 LAB — GLUCOSE, CAPILLARY
Glucose-Capillary: 176 mg/dL — ABNORMAL HIGH (ref 70–99)
Glucose-Capillary: 146 mg/dL — ABNORMAL HIGH (ref 70–99)
Glucose-Capillary: 250 mg/dL — ABNORMAL HIGH (ref 70–99)
Glucose-Capillary: 196 mg/dL — ABNORMAL HIGH (ref 70–99)

## 2019-12-30 NOTE — Progress Notes (Signed)
Progress Note    Melvin Mcclure  CBJ:[id number] DOB: [birth year]  DOA: 12/23/2019 PCP: Nolene Ebbs, MD    Brief Narrative:     Medical records reviewed and are as summarized below:  Melvin Mcclure is an 69 y.o. male with medical history significant of diastolic HF, hypertension, diabetes mellitus type 2, CKD stage IV, and vertigo, presents with complaints of shortness of breath this morning.  Patient had just recently been hospitalized from 6/25-7/4 for CHF exacerbation with community-acquired pneumonia.  Over the last week or so he would be getting more short of breath even walking short distances.  His wife notes that he was put back on oxygen due to his symptoms.  He had reportedly been intermittently wheezing and not sleeping well due to the shortness of breath  Assessment/Plan:   Principal Problem:   Acute exacerbation of CHF (congestive heart failure) (HCC) Active Problems:   CKD stage 4 due to type 2 diabetes mellitus (HCC)   Hypocalcemia   Anemia of chronic disease   Vertigo   Acute on chronic respiratory failure with hypoxia (HCC)    Acute hypoxic respiratory failure Acute on chronic diastolic congestive heart failure Diastolic congestive heart failure exacerbation: Acute on chronic. On chest x-ray showing cardiomegaly with pulmonary vascular congestion and bilateral interstitial prominence suggestive of CHF with her right-sided pleural effusion. Initial O2 saturations noted to be around 84% on room improved with 3 L nasal cannula oxygen. Last EF noted to be 55 to 60% by echocardiogram on 10/29/2019.At his last discharge his dry weight was noted to be 87 kg, but he presents today at 95.5 kg.Patient had been given 80 mg of Lasix IV in ED. --Cardiology consulted and have signed off --Cardiology discontinued furosemide and metolazone 8/24 --Continue supplemental oxygen, maintain SPO2 greater than 92% currently on 1 L nasal cannula with SPO2 94% --Fluid restriction  1200 mL/day --Strict I's and O's and daily weights: if weight is accurate, he is down from 95kg to 85 kg this hospitalization  Chest pain, atypical Patient complaining of right-sided chest pain, onset after significant coughing spells over the past 3 days.  Pain seems to be reproducible with palpation over right chest area.  No previous ischemic work-ups in the past. Cardiology recommends outpatient stress test. -trial of voltaren gel  Right pleural effusion Patient with moderate right-sided pleural effusion.  Underwent thoracentesis by IR on 12/26/2019 with 700 mL fluid removed.  Lights criteria consistent with transudate of effusion. --Continue supplemental oxygen, titrate to maintain SPO2 greater than 92% -home O2 study  Essential hypertension --Continue amlodipine 10 mg daily --hydralazine 50 mg TID --metoprolol 25 mg BID --isosorbide increased to 60 mg p.o. qHS  Acute on Chronic kidney disease stage IV Patient baseline creatinine previously noted to be around 2.7.  -Patient followedin the outpatient setting by Dr. Royce Macadamia of [facility]. -Records note a prior history of cardiorenal syndrome.   -Renal ultrasound with no evidence of hydronephrosis, echogenic renal parenchyma bilaterally consistent with medical renal disease.  Renal ultrasound consistent with medical renal disease.  Renal artery ultrasound negative for significant stenosis. --Discontinued Lasix and metolazone on 8/24 --nephrology consult appreciated as Cr elevated- defer management to them  Type 2 diabetes mellitus Hemoglobin A1c 7.1 10/29/2019.  -SSI --monitor CBGs before every meal/at bedtime --Carbohydrate modified diet  Anemia of chronic disease:  Hemoglobin 11.2 which appears improved from previous.  --Continue oral iron supplementation  Hypocalcemia: Acute on chronic.  On admission calcium level noted to be 7.6.  8.2 this morning --Continue calcium carbonate  Vertigo: Chronic.   Patient had been started on meclizine TID with some improvement in symptoms.  --Continue scheduled meclizine   Family Communication/Anticipated D/C date and plan/Code Status   DVT prophylaxis: heparin Code Status: Full Code.  Disposition Plan: Status is: Inpatient  Remains inpatient appropriate because:Inpatient level of care appropriate due to severity of illness   Dispo:  Patient From: Home  Planned Disposition: Home  Expected discharge date: 12/30/19  Medically stable for discharge: No          Medical Consultants:    Cards  renal     Subjective:   sleeping  Objective:    Vitals:   12/29/19 2300 12/30/19 0602 12/30/19 0808 12/30/19 1213  BP: (!) 119/56 138/74 136/88 137/72  Pulse: 75 71 70 72  Resp: 16 17  17   Temp: 98.7 F (37.1 C) 98.5 F (36.9 C)  97.8 F (36.6 C)  TempSrc: Oral Oral  Oral  SpO2: 98% 96%  97%  Weight:      Height:        Intake/Output Summary (Last 24 hours) at 12/30/2019 1442 Last data filed at 12/30/2019 1408 Gross per 24 hour  Intake 480 ml  Output 400 ml  Net 80 ml   Filed Weights   12/24/19 0621 12/25/19 0500 12/28/19 0610  Weight: 94.9 kg 94.1 kg 85.3 kg    Exam:  Sleeping, NAD  Data Reviewed:   I have personally reviewed following labs and imaging studies:  Labs: Labs show the following:   Basic Metabolic Panel: Recent Labs  Lab 12/27/19 0539 12/27/19 0539 12/28/19 0932 12/28/19 0932 12/28/19 1508 12/28/19 1508 12/29/19 0044 12/30/19 0629  NA 139  --  136  --  136  --  135 137  K 4.0   < > 4.0   < > 4.2   < > 4.1 4.1  CL 98  --  96*  --  95*  --  97* 99  CO2 31  --  30  --  28  --  29 28  GLUCOSE 121*  --  206*  --  268*  --  244* 168*  BUN 44*  --  55*  --  56*  --  57* 67*  CREATININE 3.47*  --  3.83*  --  3.89*  --  3.85* 3.99*  CALCIUM 7.7*  --  7.1*  --  7.3*  --  7.1* 7.0*  PHOS  --   --   --   --   --   --  4.9* 5.6*   < > = values in this interval not displayed.    GFR Estimated Creatinine Clearance: 17.8 mL/min (A) (by C-G formula based on SCr of 3.99 mg/dL (H)). Liver Function Tests: Recent Labs  Lab 12/26/19 1940 12/28/19 1508 12/30/19 0629  AST  --  23  --   ALT  --  17  --   ALKPHOS  --  80  --   BILITOT  --  0.6  --   PROT 7.7 7.5  --   ALBUMIN  --  3.2* 3.0*   No results for input(s): LIPASE, AMYLASE in the last 168 hours. No results for input(s): AMMONIA in the last 168 hours. Coagulation profile No results for input(s): INR, PROTIME in the last 168 hours.  CBC: No results for input(s): WBC, NEUTROABS, HGB, HCT, MCV, PLT in the last 168 hours. Cardiac Enzymes: No results for input(s): CKTOTAL, CKMB, CKMBINDEX,  TROPONINI in the last 168 hours. BNP (last 3 results) No results for input(s): PROBNP in the last 8760 hours. CBG: Recent Labs  Lab 12/29/19 1108 12/29/19 1607 12/29/19 2110 12/30/19 0600 12/30/19 1058  GLUCAP 230* 182* 200* 176* 146*   D-Dimer: No results for input(s): DDIMER in the last 72 hours. Hgb A1c: Recent Labs    12/29/19 1220  HGBA1C 6.6*   Lipid Profile: No results for input(s): CHOL, HDL, LDLCALC, TRIG, CHOLHDL, LDLDIRECT in the last 72 hours. Thyroid function studies: No results for input(s): TSH, T4TOTAL, T3FREE, THYROIDAB in the last 72 hours.  Invalid input(s): FREET3 Anemia work up: No results for input(s): VITAMINB12, FOLATE, FERRITIN, TIBC, IRON, RETICCTPCT in the last 72 hours. Sepsis Labs: No results for input(s): PROCALCITON, WBC, LATICACIDVEN in the last 168 hours.  Microbiology Recent Results (from the past 240 hour(s))  SARS Coronavirus 2 by RT PCR (hospital order, performed in [facility] hospital lab) Nasopharyngeal Nasopharyngeal Swab     Status: None   Collection Time: 12/23/19 10:59 AM   Specimen: Nasopharyngeal Swab  Result Value Ref Range Status   SARS Coronavirus 2 NEGATIVE NEGATIVE Final    Comment: (NOTE) SARS-CoV-2 target nucleic acids are NOT DETECTED.  The  SARS-CoV-2 RNA is generally detectable in upper and lower respiratory specimens during the acute phase of infection. The lowest concentration of SARS-CoV-2 viral copies this assay can detect is 250 copies / mL. A negative result does not preclude SARS-CoV-2 infection and should not be used as the sole basis for treatment or other patient management decisions.  A negative result may occur with improper specimen collection / handling, submission of specimen other than nasopharyngeal swab, presence of viral mutation(s) within the areas targeted by this assay, and inadequate number of viral copies (<250 copies / mL). A negative result must be combined with clinical observations, patient history, and epidemiological information.  Fact Sheet for Patients:   StrictlyIdeas.no  Fact Sheet for Healthcare Providers: BankingDealers.co.za  This test is not yet approved or  cleared by the [place] FDA and has been authorized for detection and/or diagnosis of SARS-CoV-2 by FDA under an Emergency Use Authorization (EUA).  This EUA will remain in effect (meaning this test can be used) for the duration of the COVID-19 declaration under Section 564(b)(1) of the Act, 21 U.S.C. section 360bbb-3(b)(1), unless the authorization is terminated or revoked sooner.  Performed at [facility], [place] [address], [place], [place] [address]   Gram stain     Status: None   Collection Time: 12/26/19  3:52 PM   Specimen: PATH Cytology Pleural fluid  Result Value Ref Range Status   Specimen Description FLUID PLEURAL  Final   Special Requests NONE  Final   Gram Stain   Final    ABUNDANT WBC PRESENT, PREDOMINANTLY MONONUCLEAR NO ORGANISMS SEEN Performed at [facility], [place] [address] [place], [place] [address]    Report Status 12/26/2019 FINAL  Final  Culture, body fluid-bottle     Status: None (Preliminary result)   Collection Time: 12/26/19  3:52  PM   Specimen: Fluid  Result Value Ref Range Status   Specimen Description FLUID PLEURAL  Final   Special Requests   Final    BOTTLES DRAWN AEROBIC AND ANAEROBIC Blood Culture adequate volume   Culture   Final    NO GROWTH 4 DAYS Performed at [facility], [place] [address], [place], [place] [address]    Report Status PENDING  Incomplete    Procedures and diagnostic studies:  No results found.  Medications:   . amLODipine  10 mg Oral Daily  . aspirin EC  81 mg Oral Daily  . calcitRIOL  0.25 mcg Oral Once per day on Mon Wed Fri  . calcium carbonate  2 tablet Oral Daily  . carvedilol  12.5 mg Oral BID WC  . diclofenac Sodium  2 g Topical QID  . ferrous sulfate  325 mg Oral Q breakfast  . gabapentin  300 mg Oral BID  . heparin  5,000 Units Subcutaneous Q8H  . hydrALAZINE  50 mg Oral TID  . insulin aspart  0-5 Units Subcutaneous QHS  . insulin aspart  0-6 Units Subcutaneous TID WC  . isosorbide mononitrate  60 mg Oral QPM  . meclizine  25 mg Oral TID  . sodium chloride flush  3 mL Intravenous Q12H   Continuous Infusions: . sodium chloride       LOS: 7 days   Geradine Girt  [facility]   How to contact the [facility] Attending or Consulting provider [place] or covering provider during after hours [place], for this patient?  1. Check the care team in [facility] and look for a) attending/consulting [facility] provider listed and b) the [facility] team listed 2. Log into [email or web address] and use [place]'s universal password to access. If you do not have the password, please contact the hospital operator. 3. Locate the [facility] provider you are looking for under [facility] and page to a number that you can be directly reached. 4. If you still have difficulty reaching the provider, please page the [facility] (Director on Call) for the Hospitalists listed on amion for assistance.  12/30/2019, 2:42 PM

## 2019-12-30 NOTE — Progress Notes (Addendum)
[facility] NEPHROLOGY PROGRESS NOTE  Assessment/ Plan: Pt is a 69 y.o. yo male  with history of hypertension, DM, diastolic CHF, CKD stage IV follows at [place] [facility], anemia on outpatient IV iron and ESA injection, admitted on 12/23/2019 for CHF exacerbation.  The creatinine level was 2.91 on admission which is close to his baseline.  He was treated with IV Lasix bid in addition to metolazone with significant diuresis.  He lost around 10 kg since admission however the creatinine level continued to go up to 3.89 and BUN level 56.  We are consulted to manage AKI on CKD and volume management.  He was on torsemide 20 mg at home.  #Acute kidney injury on CKD stage IV, nonoliguric: Likely due to overdiuresis.  He lost 10 kg in 3 days.  UA with chronic proteinuria probably because of diabetes, no RBCs. Kidney ultrasound without obstruction. Received a 500 cc of NS on 8/25.  BUN and creatinine level mildly trending up today. The volume status and respiratory status acceptable.  I will continue to hold diuretics.  No more IV fluid because of CHF.  Hopefully the creatinine level will plateau in next 1 to 2 days. He will need higher dose of torsemide on discharge around 40 mg every day.  I have discussed this with the patient and his wife today. Strict ins and outs, daily weight monitoring.  Avoid hypotensive episode or nephrotoxic medication.  #CHF exacerbation: Seen by cardiology, treated with IV diuretics.  He was on torsemide 20 mg at home, he will need higher dose of torsemide on discharge.  For now holding diuretics because of AKI.  #Anemia of CKD: Received 1 dose of IV iron as outpatient.  Hemoglobin at goal.  #CKD MBD: Monitor phosphorus level, recommend renal diet. He is on calcitriol.  #Hypertension: BP variable.  Volume status discussed as above.  Continue current antihypertensive medication.  #Acute hypoxic respiratory failure: Clinically improved.  He was wearing oxygen  this morning, try to wean to room air.  Discussed with the primary team.  Subjective: Seen and examined at bedside.  Lying on bed comfortable.  Denies nausea vomiting chest pain shortness of breath.  Using oxygen.  His wife at bedside.  Objective Vital signs in last 24 hours: Vitals:   12/29/19 1658 12/29/19 2300 12/30/19 0602 12/30/19 0808  BP: 124/65 (!) 119/56 138/74 136/88  Pulse: 73 75 71 70  Resp: 16 16 17    Temp: 98 F (36.7 C) 98.7 F (37.1 C) 98.5 F (36.9 C)   TempSrc: Oral Oral Oral   SpO2: 97% 98% 96%   Weight:      Height:       Weight change:   Intake/Output Summary (Last 24 hours) at 12/30/2019 1151 Last data filed at 12/30/2019 0718 Gross per 24 hour  Intake 480 ml  Output 700 ml  Net -220 ml       Labs: Basic Metabolic Panel: Recent Labs  Lab 12/28/19 1508 12/29/19 0044 12/30/19 0629  NA 136 135 137  K 4.2 4.1 4.1  CL 95* 97* 99  CO2 28 29 28   GLUCOSE 268* 244* 168*  BUN 56* 57* 67*  CREATININE 3.89* 3.85* 3.99*  CALCIUM 7.3* 7.1* 7.0*  PHOS  --  4.9* 5.6*   Liver Function Tests: Recent Labs  Lab 12/26/19 1940 12/28/19 1508 12/30/19 0629  AST  --  23  --   ALT  --  17  --   ALKPHOS  --  80  --  BILITOT  --  0.6  --   PROT 7.7 7.5  --   ALBUMIN  --  3.2* 3.0*   No results for input(s): LIPASE, AMYLASE in the last 168 hours. No results for input(s): AMMONIA in the last 168 hours. CBC: No results for input(s): WBC, NEUTROABS, HGB, HCT, MCV, PLT in the last 168 hours. Cardiac Enzymes: No results for input(s): CKTOTAL, CKMB, CKMBINDEX, TROPONINI in the last 168 hours. CBG: Recent Labs  Lab 12/29/19 1108 12/29/19 1607 12/29/19 2110 12/30/19 0600 12/30/19 1058  GLUCAP 230* 182* 200* 176* 146*    Iron Studies:  No results for input(s): IRON, TIBC, TRANSFERRIN, FERRITIN in the last 72 hours. Studies/Results: No results found.  Medications: Infusions:  sodium chloride      Scheduled Medications:  amLODipine  10 mg  Oral Daily   aspirin EC  81 mg Oral Daily   calcitRIOL  0.25 mcg Oral Once per day on Mon Wed Fri   calcium carbonate  2 tablet Oral Daily   carvedilol  12.5 mg Oral BID WC   diclofenac Sodium  2 g Topical QID   ferrous sulfate  325 mg Oral Q breakfast   gabapentin  300 mg Oral BID   heparin  5,000 Units Subcutaneous Q8H   hydrALAZINE  50 mg Oral TID   insulin aspart  0-5 Units Subcutaneous QHS   insulin aspart  0-6 Units Subcutaneous TID WC   isosorbide mononitrate  60 mg Oral QPM   meclizine  25 mg Oral TID   sodium chloride flush  3 mL Intravenous Q12H    have reviewed scheduled and prn medications.  Physical Exam: General: Not in distress, looks comfortable Heart:RRR, s1s2 nl, no rubs Lungs: Clear bilateral, no wheezing or crackle Abdomen:soft, Non-tender, non-distended Extremities:No lower extremity edema Neurology: Alert awake and following commands  Melvin Mcclure 12/30/2019,11:51 AM  LOS: 7 days  Pager: [phone]

## 2019-12-31 LAB — BASIC METABOLIC PANEL
Anion gap: 12 (ref 5–15)
BUN: 79 mg/dL — ABNORMAL HIGH (ref 8–23)
CO2: 23 mmol/L (ref 22–32)
Calcium: 7.1 mg/dL — ABNORMAL LOW (ref 8.9–10.3)
Chloride: 98 mmol/L (ref 98–111)
Creatinine, Ser: 4.34 mg/dL — ABNORMAL HIGH (ref 0.61–1.24)
GFR calc Af Amer: 15 mL/min — ABNORMAL LOW (ref >60)
GFR calc non Af Amer: 13 mL/min — ABNORMAL LOW (ref >60)
Glucose, Bld: 256 mg/dL — ABNORMAL HIGH (ref 70–99)
Potassium: 4.1 mmol/L (ref 3.5–5.1)
Sodium: 133 mmol/L — ABNORMAL LOW (ref 135–145)

## 2019-12-31 LAB — OCCULT BLOOD X 1 CARD TO LAB, STOOL: Fecal Occult Bld: NEGATIVE

## 2019-12-31 LAB — CULTURE, BODY FLUID W GRAM STAIN -BOTTLE
Culture: NO GROWTH
Special Requests: ADEQUATE

## 2019-12-31 LAB — GLUCOSE, CAPILLARY
Glucose-Capillary: 163 mg/dL — ABNORMAL HIGH (ref 70–99)
Glucose-Capillary: 234 mg/dL — ABNORMAL HIGH (ref 70–99)

## 2019-12-31 MED ORDER — SODIUM CHLORIDE 0.9 % IV BOLUS
500.0000 mL | Freq: Once | INTRAVENOUS | Status: AC
  Administered 2019-12-31: 500 mL via INTRAVENOUS

## 2019-12-31 MED ORDER — ISOSORBIDE MONONITRATE ER 60 MG PO TB24
60.0000 mg | ORAL_TABLET | Freq: Every evening | ORAL | 0 refills | Status: DC
Start: 2019-12-31 — End: 2020-02-16

## 2019-12-31 MED ORDER — GABAPENTIN 300 MG PO CAPS: 300.0000 mg | ORAL_CAPSULE | Freq: Two times a day (BID) | ORAL | 0 refills | Status: DC

## 2019-12-31 MED ORDER — MECLIZINE HCL 25 MG PO TABS: 25.0000 mg | ORAL_TABLET | Freq: Three times a day (TID) | ORAL | 0 refills | Status: AC

## 2019-12-31 MED ORDER — DICLOFENAC SODIUM 1 % EX GEL: 2.0000 g | Freq: Four times a day (QID) | CUTANEOUS | Status: AC

## 2019-12-31 MED ORDER — CARVEDILOL 12.5 MG PO TABS
12.5000 mg | ORAL_TABLET | Freq: Two times a day (BID) | ORAL | 0 refills | Status: DC
Start: 2019-12-31 — End: 2020-02-16

## 2019-12-31 NOTE — Progress Notes (Signed)
Pt given discharge summary and discharged via wife as transportation.

## 2019-12-31 NOTE — Discharge Summary (Signed)
Physician Discharge Summary  Melvin Mcclure NGE:[id number] DOB: 1950-12-19 DOA: 12/23/2019  PCP: Nolene Ebbs, MD  Admit date: 12/23/2019 Discharge date: 12/31/2019  Admitted From: home Discharge disposition: home   Recommendations for Outpatient Follow-Up:   1. BMP early next week-- Dr. Carolin Sicks making arrangements 2. Diuretics on hold for now until decided by renal 3. Patient to weight daily and keep record 4. Consider addition of tradjenta or another agent for BS control if diet does not work   Discharge Diagnosis:   Principal Problem:   Acute exacerbation of CHF (congestive heart failure) ([place]) Active Problems:   CKD stage 4 due to type 2 diabetes mellitus (HCC)   Hypocalcemia   Anemia of chronic disease   Vertigo   Acute on chronic respiratory failure with hypoxia ([place])    Discharge Condition: Improved.  Diet recommendation: Low sodium, heart healthy.  Carbohydrate-modified  Wound care: None.  Code status: Full.   History of Present Illness:  Melvin Mcclure is a 69 y.o. male with medical history significant of diastolic HF, hypertension, diabetes mellitus type 2, CKD stage IV, and vertigo, presents with complaints of shortness of breath this morning.  Patient had just recently been hospitalized from 6/25-7/4 for CHF exacerbation with community-acquired pneumonia.  Over the last week or so he would be getting more short of breath even walking short distances.  His wife notes that he was put back on oxygen due to his symptoms.  He had reportedly been intermittently wheezing and not sleeping well due to the shortness of breath.  Denied having any significant change in his weight, coughing, or worsening leg swelling.  His wife reported that O2 saturations were as low as the 70s this morning when she checked which prompted her to bring him to the hospital, but he also had appointment for today to follow-up with cardiology.  Patient had last been seen by his  nephrologist Dr. Royce Macadamia on 8/4 where he was increased to torsemide 20 mg daily, increased Tums, and increased on calcitriol.  Her records that were available is creatinine appears to have been around 2.4.   Hospital Course by Problem:   Acute hypoxic respiratory failure Acute on chronic diastolic congestive heart failure Diastolic congestive heart failure exacerbation: Acute on chronic. On chest x-ray showing cardiomegaly with pulmonary vascular congestion and bilateral interstitial prominence suggestive of CHF with her right-sided pleural effusion. Initial O2 saturations noted to be around 84% on room improved with 3 L nasal cannula oxygen. Last EF noted to be 55 to 60% by echocardiogram on 10/29/2019.At his last discharge his dry weight was noted to be 87 kg, but he presents today at 95.5 kg.Patient had been given 80 mg of Lasix IV in ED. --Cardiology consulted and have signed off- see recs below --Fluid restriction 1200 mL/day  Chest pain, atypical Patient complaining of right-sided chest pain, onset after significant coughing spells over the past 3 days. Pain seems to be reproducible with palpation over right chest area. No previous ischemic work-ups in the past. Cardiology recommends outpatient stress test. -trial of voltaren gel helped  Right pleural effusion Patient with moderate right-sided pleural effusion.Underwent thoracentesis by IR on 12/26/2019 with 700 mL fluid removed. Lights criteria consistent with transudate of effusion. --Continue supplemental oxygen, titrate to maintain SPO2 greater than 92% -home O2 study  Essential hypertension --Continue amlodipine 10 mg daily --hydralazine 50 mg TID --coreg --isosorbide increased to 60 mg p.o. qHS per cards: Medication Recommendations:  diuretics per primary team and/or  nephrology given renal function. No change to prior amlodipine, aspirin, hydralazine dosing. Keep current carvedilol dose, replaces prior metoprolol.  Imdur dose has been increased this admission, keep higher dose Other recommendations (labs, testing, etc):  We will see him in the clinic and evaluate the need for an outpatient stress test at that time.  Acute on Chronic kidney disease stage IV Patient baseline creatinine previously noted to be around 2.7.  -Patient followedin the outpatient setting by Dr. Royce [facility]adamia of [facility]. -Records note a prior history of cardiorenal syndrome.  -Renal ultrasound with no evidence of hydronephrosis, echogenic renal parenchyma bilaterally consistent with medical renal disease.Renal ultrasound consistent with medical renal disease. Renal artery ultrasound negative for significant stenosis. --Discontinued Lasix and metolazone on 8/24 --nephrology consult appreciated: Likely due to overdiuresis. He lost 10 kg in 3 days.  UA with chronic proteinuria probably because of diabetes, no RBCs. Kidney ultrasound without obstruction. Received a 500 cc of NS on 8/25.  He has increasing urine output however BUN and creatinine level continue to go up.  Clinically he is asymptomatic.  Walking in the room and not using any oxygen supplement.  He still looks dry on physical exam. Today is his grandson's 45th birthday therefore very eager to go home.  He is all packed up to go home. He agreed for another 500 cc of NS today.  We will plan to check lab early next week and follow-up at [place] [facility].  Continue to hold diuretics until then.   He has no uremic features.  Type 2 diabetes mellitus Hemoglobin A1c 7.1 10/29/2019.  -carb mod diet -? tradjenta- will defer to PCP  Anemia of chronic disease:  --Continueoraliron supplementation outpatient-- IV Fe given x 1 by renal outpatient  Hypocalcemia: Acute on chronic.  --Continue calcium carbonate  Vertigo: Chronic.  Patient had been started on meclizine TID with some improvement in symptoms.  --Continue scheduled meclizine -outpatient  vestibular rehab    Medical Consultants:   Cards renal   Discharge Exam:   Vitals:   12/31/19 0607 12/31/19 1234  BP: 135/74 134/72  Pulse: 72 70  Resp: 16 16  Temp: 98.4 F (36.9 C) 97.8 F (36.6 C)  SpO2: 96% 91%   Vitals:   12/30/19 1758 12/31/19 0009 12/31/19 0607 12/31/19 1234  BP: (!) 147/71 133/80 135/74 134/72  Pulse: 79 75 72 70  Resp: 18 16 16 16   Temp: 98.5 F (36.9 C) 98.3 F (36.8 C) 98.4 F (36.9 C) 97.8 F (36.6 C)  TempSrc: Oral Oral Oral Oral  SpO2: 99% 91% 96% 91%  Weight:      Height:        General exam: Appears calm and comfortable.    The results of significant diagnostics from this hospitalization (including imaging, microbiology, ancillary and laboratory) are listed below for reference.     Procedures and Diagnostic Studies:   DG Chest 2 View  Result Date: 12/23/2019 CLINICAL DATA:  Shortness of breath. EXAM: CHEST - 2 VIEW COMPARISON:  11/05/2019. FINDINGS: Cardiomegaly. Mild pulmonary venous congestion. Bilateral interstitial prominence suggesting CHF. Prominent right base atelectasis/infiltrate and moderate right pleural effusion. No pneumothorax. Degenerative change thoracic spine. IMPRESSION: 1. Cardiomegaly. Mild pulmonary venous congestion. Bilateral interstitial prominence suggesting CHF. 2. Prominent right base atelectasis/infiltrate and moderate right pleural effusion. Electronically Signed   By: Marcello Moores  Register   On: 12/23/2019 07:08     Labs:   Basic Metabolic Panel: Recent Labs  Lab 12/28/19 0932 12/28/19 0932 12/28/19 1508  12/28/19 1508 12/29/19 0044 12/29/19 0044 12/30/19 0629 12/31/19 0222  NA 136  --  136  --  135  --  137 133*  K 4.0   < > 4.2   < > 4.1   < > 4.1 4.1  CL 96*  --  95*  --  97*  --  99 98  CO2 30  --  28  --  29  --  28 23  GLUCOSE 206*  --  268*  --  244*  --  168* 256*  BUN 55*  --  56*  --  57*  --  67* 79*  CREATININE 3.83*  --  3.89*  --  3.85*  --  3.99* 4.34*  CALCIUM 7.1*  --   7.3*  --  7.1*  --  7.0* 7.1*  PHOS  --   --   --   --  4.9*  --  5.6*  --    < > = values in this interval not displayed.   GFR Estimated Creatinine Clearance: 16.4 mL/min (A) (by C-G formula based on SCr of 4.34 mg/dL (H)). Liver Function Tests: Recent Labs  Lab 12/26/19 1940 12/28/19 1508 12/30/19 0629  AST  --  23  --   ALT  --  17  --   ALKPHOS  --  80  --   BILITOT  --  0.6  --   PROT 7.7 7.5  --   ALBUMIN  --  3.2* 3.0*   No results for input(s): LIPASE, AMYLASE in the last 168 hours. No results for input(s): AMMONIA in the last 168 hours. Coagulation profile No results for input(s): INR, PROTIME in the last 168 hours.  CBC: No results for input(s): WBC, NEUTROABS, HGB, HCT, MCV, PLT in the last 168 hours. Cardiac Enzymes: No results for input(s): CKTOTAL, CKMB, CKMBINDEX, TROPONINI in the last 168 hours. BNP: Invalid input(s): POCBNP CBG: Recent Labs  Lab 12/30/19 1058 12/30/19 1612 12/30/19 2158 12/31/19 0606 12/31/19 1048  GLUCAP 146* 250* 196* 163* 234*   D-Dimer No results for input(s): DDIMER in the last 72 hours. Hgb A1c Recent Labs    12/29/19 1220  HGBA1C 6.6*   Lipid Profile No results for input(s): CHOL, HDL, LDLCALC, TRIG, CHOLHDL, LDLDIRECT in the last 72 hours. Thyroid function studies No results for input(s): TSH, T4TOTAL, T3FREE, THYROIDAB in the last 72 hours.  Invalid input(s): FREET3 Anemia work up No results for input(s): VITAMINB12, FOLATE, FERRITIN, TIBC, IRON, RETICCTPCT in the last 72 hours. Microbiology Recent Results (from the past 240 hour(s))  SARS Coronavirus 2 by RT PCR (hospital order, performed in [facility] hospital lab) Nasopharyngeal Nasopharyngeal Swab     Status: None   Collection Time: 12/23/19 10:59 AM   Specimen: Nasopharyngeal Swab  Result Value Ref Range Status   SARS Coronavirus 2 NEGATIVE NEGATIVE Final    Comment: (NOTE) SARS-CoV-2 target nucleic acids are NOT DETECTED.  The SARS-CoV-2 RNA is  generally detectable in upper and lower respiratory specimens during the acute phase of infection. The lowest concentration of SARS-CoV-2 viral copies this assay can detect is 250 copies / mL. A negative result does not preclude SARS-CoV-2 infection and should not be used as the sole basis for treatment or other patient management decisions.  A negative result may occur with improper specimen collection / handling, submission of specimen other than nasopharyngeal swab, presence of viral mutation(s) within the areas targeted by this assay, and inadequate number of viral copies (<250  copies / mL). A negative result must be combined with clinical observations, patient history, and epidemiological information.  Fact Sheet for Patients:   StrictlyIdeas.no  Fact Sheet for Healthcare Providers: BankingDealers.co.za  This test is not yet approved or  cleared by the [place] FDA and has been authorized for detection and/or diagnosis of SARS-CoV-2 by FDA under an Emergency Use Authorization (EUA).  This EUA will remain in effect (meaning this test can be used) for the duration of the COVID-19 declaration under Section 564(b)(1) of the Act, 21 U.S.C. section 360bbb-3(b)(1), unless the authorization is terminated or revoked sooner.  Performed at [facility], [place] [address], [place], [place] [address]   Gram stain     Status: None   Collection Time: 12/26/19  3:52 PM   Specimen: PATH Cytology Pleural fluid  Result Value Ref Range Status   Specimen Description FLUID PLEURAL  Final   Special Requests NONE  Final   Gram Stain   Final    ABUNDANT WBC PRESENT, PREDOMINANTLY MONONUCLEAR NO ORGANISMS SEEN Performed at [facility], [place] [address] [place], [place] [address]    Report Status 12/26/2019 FINAL  Final  Culture, body fluid-bottle     Status: None   Collection Time: 12/26/19  3:52 PM   Specimen: Fluid  Result Value Ref  Range Status   Specimen Description FLUID PLEURAL  Final   Special Requests   Final    BOTTLES DRAWN AEROBIC AND ANAEROBIC Blood Culture adequate volume   Culture   Final    NO GROWTH 5 DAYS Performed at [facility], [place] [address], [place], [place] [address]    Report Status 12/31/2019 FINAL  Final     Discharge Instructions:   Discharge Instructions    ([place]) Call MD:  Anytime you have any of the following symptoms: 1) 3 pound weight gain in 24 hours or 5 pounds in 1 week 2) shortness of breath, with or without a dry hacking cough 3) swelling in the hands, feet or stomach 4) if you have to sleep on extra pillows at night in order to breathe.   Complete by: As directed    Ambulatory referral to Physical Therapy   Complete by: As directed    Iontophoresis - 4 mg/ml of dexamethasone: No   T.E.N.S. Unit Evaluation and Dispense as Indicated: No   Diet - low sodium heart healthy   Complete by: As directed    Diet Carb Modified   Complete by: As directed    Discharge instructions   Complete by: As directed    check renal panel next week and follow-up at [place] [facility].  Continue to hold diuretics until then.   Increase activity slowly   Complete by: As directed      Allergies as of 12/31/2019   No Known Allergies     Medication List    STOP taking these medications   metoprolol tartrate 25 MG tablet Commonly known as: LOPRESSOR   torsemide 20 MG tablet Commonly known as: DEMADEX     TAKE these medications   amLODipine 10 MG tablet Commonly known as: NORVASC Take 10 mg by mouth daily.   aspirin 81 MG tablet Take 81 mg by mouth daily.   calcitRIOL 0.25 MCG capsule Commonly known as: ROCALTROL Take 0.25 mcg by mouth 3 (three) times a week. Monday, Wednesday, Friday   calcium carbonate 500 MG chewable tablet Commonly known as: TUMS - dosed in mg  elemental calcium Chew 2 tablets by mouth daily.   carvedilol 12.5 MG tablet Commonly  known as: COREG Take 1 tablet (12.5 mg total) by mouth 2 (two) times daily with a meal.   diclofenac Sodium 1 % Gel Commonly known as: VOLTAREN Apply 2 g topically 4 (four) times daily.   ferrous sulfate 325 (65 FE) MG tablet Take 325 mg by mouth daily with breakfast.   gabapentin 300 MG capsule Commonly known as: NEURONTIN Take 1 capsule (300 mg total) by mouth 2 (two) times daily. What changed: when to take this   hydrALAZINE 50 MG tablet Commonly known as: APRESOLINE Take 1 tablet (50 mg total) by mouth every 8 (eight) hours. What changed: when to take this   isosorbide mononitrate 60 MG 24 hr tablet Commonly known as: IMDUR Take 1 tablet (60 mg total) by mouth every evening. What changed:   medication strength  how much to take   meclizine 25 MG tablet Commonly known as: ANTIVERT Take 1 tablet (25 mg total) by mouth 3 (three) times daily. What changed:   when to take this  reasons to take this   senna-docusate 8.6-50 MG tablet Commonly known as: Senokot-S Take 1 tablet by mouth 2 (two) times daily.            Durable Medical Equipment  (From admission, onward)         Start     Ordered   12/27/19 1443  For home use only DME oxygen  Once       Comments: POC Evaluation  Question Answer Comment  Length of Need Lifetime   Mode or (Route) Nasal cannula   Liters per Minute 2   Frequency Continuous (stationary and portable oxygen unit needed)   Oxygen delivery system Gas      12/27/19 1443          Follow-up Information    Deberah Pelton, NP Follow up on 01/18/2020.   Specialty: Cardiology Why: @ 11:45AM Contact information: [address] [place] [address] [place] Follow up.   Specialty: Rehabilitation Why: They will call to arrange an appointment  Contact information: [place]  [place]       Nolene Ebbs, MD Follow up in 1 week(s).   Specialty: Internal Medicine Contact information: [place] [address] [phone]        Lelon Perla, MD .   Specialty: Cardiology Contact information: [address] [place] [address] [phone]        Rosita Fire, MD Follow up.   Specialties: Nephrology, Internal Medicine Contact information: [address] [place] [address] [phone]                Time coordinating discharge: 35 min  Signed:  Geradine Girt DO  [facility] 12/31/2019, 1:20 PM

## 2019-12-31 NOTE — Progress Notes (Addendum)
[facility] NEPHROLOGY PROGRESS NOTE  Assessment/ Plan: Pt is a 69 y.o. yo male  with history of hypertension, DM, diastolic CHF, CKD stage IV follows at [place] [facility], anemia on outpatient IV iron and ESA injection, admitted on 12/23/2019 for CHF exacerbation.  The creatinine level was 2.91 on admission which is close to his baseline.  He was treated with IV Lasix bid in addition to metolazone with significant diuresis.  He lost around 10 kg since admission however the creatinine level continued to go up to 3.89 and BUN level 56.  We are consulted to manage AKI on CKD and volume management.  He was on torsemide 20 mg at home.  #Acute kidney injury on CKD stage IV, nonoliguric: Likely due to overdiuresis.  He lost 10 kg in 3 days.  UA with chronic proteinuria probably because of diabetes, no RBCs. Kidney ultrasound without obstruction. Received a 500 cc of NS on 8/25.  He has increasing urine output however BUN and creatinine level continue to go up.  Clinically he is asymptomatic.  Walking in the room and not using any oxygen supplement.  He still looks dry on physical exam. Today is his grandson's 43th birthday therefore very eager to go home.  He is all packed up to go home. He agreed for another 500 cc of NS today.  We will plan to check lab early next week and follow-up at [place] [facility].  Continue to hold diuretics until then.   He has no uremic features. I have discussed above plan with the patient, his wife, nurse and primary team Dr. Eliseo Squires.  #CHF exacerbation: Seen by cardiology, treated with IV diuretics.  He was on torsemide 20 mg at home.  Holding diuretics for now.  #Anemia of CKD: Received 1 dose of IV iron as outpatient.  Hemoglobin at goal.  #CKD MBD: Monitor phosphorus level, recommend renal diet. He is on calcitriol.  #Hypertension: BP variable.  Volume status discussed as above.  Continue current antihypertensive medication.  #Acute hypoxic  respiratory failure: Improved.  On room air.  Since patient wants to go home today we will let him go today, continue to hold diuretics with plan to follow-up closely as outpatient. I reviewed this with patient's wife who is a Marine scientist.  Subjective: Seen and examined at bedside.  He is frustrated that the kidneys are not improving.  He feels good and all packed up to go home.  Increasing urine output.  Walking in the room.  His wife at bedside.  Objective Vital signs in last 24 hours: Vitals:   12/30/19 1702 12/30/19 1758 12/31/19 0009 12/31/19 0607  BP: 132/62 (!) 147/71 133/80 135/74  Pulse: 77 79 75 72  Resp: 17 18 16 16   Temp: 98.9 F (37.2 C) 98.5 F (36.9 C) 98.3 F (36.8 C) 98.4 F (36.9 C)  TempSrc: Oral Oral Oral Oral  SpO2: 95% 99% 91% 96%  Weight:      Height:       Weight change:   Intake/Output Summary (Last 24 hours) at 12/31/2019 1151 Last data filed at 12/31/2019 3428 Gross per 24 hour  Intake 480 ml  Output 1150 ml  Net -670 ml       Labs: Basic Metabolic Panel: Recent Labs  Lab 12/29/19 0044 12/30/19 0629 12/31/19 0222  NA 135 137 133*  K 4.1 4.1 4.1  CL 97* 99 98  CO2 29 28 23   GLUCOSE 244* 168* 256*  BUN 57* 67* 79*  CREATININE  3.85* 3.99* 4.34*  CALCIUM 7.1* 7.0* 7.1*  PHOS 4.9* 5.6*  --    Liver Function Tests: Recent Labs  Lab 12/26/19 1940 12/28/19 1508 12/30/19 0629  AST  --  23  --   ALT  --  17  --   ALKPHOS  --  80  --   BILITOT  --  0.6  --   PROT 7.7 7.5  --   ALBUMIN  --  3.2* 3.0*   No results for input(s): LIPASE, AMYLASE in the last 168 hours. No results for input(s): AMMONIA in the last 168 hours. CBC: No results for input(s): WBC, NEUTROABS, HGB, HCT, MCV, PLT in the last 168 hours. Cardiac Enzymes: No results for input(s): CKTOTAL, CKMB, CKMBINDEX, TROPONINI in the last 168 hours. CBG: Recent Labs  Lab 12/30/19 1058 12/30/19 1612 12/30/19 2158 12/31/19 0606 12/31/19 1048  GLUCAP 146* 250* 196* 163* 234*     Iron Studies:  No results for input(s): IRON, TIBC, TRANSFERRIN, FERRITIN in the last 72 hours. Studies/Results: No results found.  Medications: Infusions: . sodium chloride    . sodium chloride      Scheduled Medications: . amLODipine  10 mg Oral Daily  . aspirin EC  81 mg Oral Daily  . calcitRIOL  0.25 mcg Oral Once per day on Mon Wed Fri  . calcium carbonate  2 tablet Oral Daily  . carvedilol  12.5 mg Oral BID WC  . diclofenac Sodium  2 g Topical QID  . ferrous sulfate  325 mg Oral Q breakfast  . gabapentin  300 mg Oral BID  . heparin  5,000 Units Subcutaneous Q8H  . hydrALAZINE  50 mg Oral TID  . insulin aspart  0-5 Units Subcutaneous QHS  . insulin aspart  0-6 Units Subcutaneous TID WC  . isosorbide mononitrate  60 mg Oral QPM  . meclizine  25 mg Oral TID  . sodium chloride flush  3 mL Intravenous Q12H    have reviewed scheduled and prn medications.  Physical Exam: General: Ambulating in room, not in distress, comfortable Heart:RRR, s1s2 nl, no rubs Lungs: Clear bilateral, no wheezing or crackle Abdomen:soft, Non-tender, non-distended Extremities: No LE edema Neurology: Alert awake and following commands  Elexius Minar Tanna Furry 12/31/2019,11:51 AM  LOS: 8 days  Pager: [phone]

## 2020-01-13 ENCOUNTER — Encounter: Payer: Self-pay | Admitting: Nephrology

## 2020-01-17 ENCOUNTER — Other Ambulatory Visit: Payer: Self-pay

## 2020-01-17 ENCOUNTER — Emergency Department (HOSPITAL_COMMUNITY): Payer: Medicare Other

## 2020-01-17 ENCOUNTER — Encounter (HOSPITAL_COMMUNITY): Payer: Self-pay | Admitting: Emergency Medicine

## 2020-01-17 ENCOUNTER — Encounter: Payer: Self-pay | Admitting: Internal Medicine

## 2020-01-17 ENCOUNTER — Telehealth: Payer: Self-pay | Admitting: Cardiology

## 2020-01-17 ENCOUNTER — Ambulatory Visit (INDEPENDENT_AMBULATORY_CARE_PROVIDER_SITE_OTHER): Payer: Medicare Other | Admitting: Internal Medicine

## 2020-01-17 ENCOUNTER — Inpatient Hospital Stay (HOSPITAL_COMMUNITY)
Admission: EM | Admit: 2020-01-17 | Discharge: 2020-01-23 | DRG: 291 | Disposition: A | Discharge status: Home / Self Care | Payer: Medicare Other | Attending: Internal Medicine | Admitting: Internal Medicine

## 2020-01-17 VITALS — BP 160/70 | HR 64 | Ht 70.0 in | Wt 210.0 lb

## 2020-01-17 DIAGNOSIS — J90 Pleural effusion, not elsewhere classified: Secondary | ICD-10-CM | POA: Diagnosis present

## 2020-01-17 DIAGNOSIS — I5031 Acute diastolic (congestive) heart failure: Secondary | ICD-10-CM

## 2020-01-17 DIAGNOSIS — J918 Pleural effusion in other conditions classified elsewhere: Secondary | ICD-10-CM | POA: Diagnosis present

## 2020-01-17 DIAGNOSIS — I10 Essential (primary) hypertension: Secondary | ICD-10-CM | POA: Diagnosis present

## 2020-01-17 DIAGNOSIS — I110 Hypertensive heart disease with heart failure: Secondary | ICD-10-CM | POA: Diagnosis present

## 2020-01-17 DIAGNOSIS — E119 Type 2 diabetes mellitus without complications: Secondary | ICD-10-CM

## 2020-01-17 DIAGNOSIS — N185 Chronic kidney disease, stage 5: Secondary | ICD-10-CM | POA: Diagnosis present

## 2020-01-17 DIAGNOSIS — E1122 Type 2 diabetes mellitus with diabetic chronic kidney disease: Secondary | ICD-10-CM | POA: Diagnosis not present

## 2020-01-17 DIAGNOSIS — I130 Hypertensive heart and chronic kidney disease with heart failure and stage 1 through stage 4 chronic kidney disease, or unspecified chronic kidney disease: Principal | ICD-10-CM | POA: Diagnosis present

## 2020-01-17 DIAGNOSIS — I5032 Chronic diastolic (congestive) heart failure: Secondary | ICD-10-CM | POA: Diagnosis present

## 2020-01-17 DIAGNOSIS — I509 Heart failure, unspecified: Secondary | ICD-10-CM | POA: Diagnosis not present

## 2020-01-17 DIAGNOSIS — J9601 Acute respiratory failure with hypoxia: Secondary | ICD-10-CM | POA: Diagnosis not present

## 2020-01-17 DIAGNOSIS — Z833 Family history of diabetes mellitus: Secondary | ICD-10-CM

## 2020-01-17 DIAGNOSIS — E785 Hyperlipidemia, unspecified: Secondary | ICD-10-CM | POA: Diagnosis present

## 2020-01-17 DIAGNOSIS — Z8249 Family history of ischemic heart disease and other diseases of the circulatory system: Secondary | ICD-10-CM

## 2020-01-17 DIAGNOSIS — Z7982 Long term (current) use of aspirin: Secondary | ICD-10-CM

## 2020-01-17 DIAGNOSIS — Z794 Long term (current) use of insulin: Secondary | ICD-10-CM

## 2020-01-17 DIAGNOSIS — N184 Chronic kidney disease, stage 4 (severe): Secondary | ICD-10-CM | POA: Diagnosis present

## 2020-01-17 DIAGNOSIS — E46 Unspecified protein-calorie malnutrition: Secondary | ICD-10-CM | POA: Diagnosis present

## 2020-01-17 DIAGNOSIS — N179 Acute kidney failure, unspecified: Secondary | ICD-10-CM | POA: Diagnosis present

## 2020-01-17 DIAGNOSIS — I5033 Acute on chronic diastolic (congestive) heart failure: Secondary | ICD-10-CM | POA: Diagnosis present

## 2020-01-17 DIAGNOSIS — R0902 Hypoxemia: Secondary | ICD-10-CM | POA: Diagnosis not present

## 2020-01-17 DIAGNOSIS — N2581 Secondary hyperparathyroidism of renal origin: Secondary | ICD-10-CM | POA: Diagnosis present

## 2020-01-17 DIAGNOSIS — D631 Anemia in chronic kidney disease: Secondary | ICD-10-CM | POA: Diagnosis present

## 2020-01-17 DIAGNOSIS — R188 Other ascites: Secondary | ICD-10-CM

## 2020-01-17 DIAGNOSIS — E8889 Other specified metabolic disorders: Secondary | ICD-10-CM | POA: Diagnosis present

## 2020-01-17 DIAGNOSIS — Z20822 Contact with and (suspected) exposure to covid-19: Secondary | ICD-10-CM | POA: Diagnosis present

## 2020-01-17 DIAGNOSIS — Z79899 Other long term (current) drug therapy: Secondary | ICD-10-CM

## 2020-01-17 DIAGNOSIS — I43 Cardiomyopathy in diseases classified elsewhere: Secondary | ICD-10-CM

## 2020-01-17 LAB — CBC
HCT: 37.1 % — ABNORMAL LOW (ref 39.0–52.0)
Hemoglobin: 11.1 g/dL — ABNORMAL LOW (ref 13.0–17.0)
MCH: 27.1 pg (ref 26.0–34.0)
MCHC: 29.9 g/dL — ABNORMAL LOW (ref 30.0–36.0)
MCV: 90.7 fL (ref 80.0–100.0)
Platelets: 271 10*3/uL (ref 150–400)
RBC: 4.09 MIL/uL — ABNORMAL LOW (ref 4.22–5.81)
RDW: 16.9 % — ABNORMAL HIGH (ref 11.5–15.5)
WBC: 7 10*3/uL (ref 4.0–10.5)
nRBC: 0 % (ref 0.0–0.2)

## 2020-01-17 LAB — BASIC METABOLIC PANEL
Anion gap: 10 (ref 5–15)
BUN: 65 mg/dL — ABNORMAL HIGH (ref 8–23)
CO2: 23 mmol/L (ref 22–32)
Calcium: 7.1 mg/dL — ABNORMAL LOW (ref 8.9–10.3)
Chloride: 109 mmol/L (ref 98–111)
Creatinine, Ser: 3.64 mg/dL — ABNORMAL HIGH (ref 0.61–1.24)
GFR calc Af Amer: 18 mL/min — ABNORMAL LOW (ref >60)
GFR calc non Af Amer: 16 mL/min — ABNORMAL LOW (ref >60)
Glucose, Bld: 156 mg/dL — ABNORMAL HIGH (ref 70–99)
Potassium: 4.3 mmol/L (ref 3.5–5.1)
Sodium: 142 mmol/L (ref 135–145)

## 2020-01-17 LAB — CBG MONITORING, ED: Glucose-Capillary: 173 mg/dL — ABNORMAL HIGH (ref 70–99)

## 2020-01-17 LAB — TROPONIN I (HIGH SENSITIVITY)
Troponin I (High Sensitivity): 10 ng/L (ref <18)
Troponin I (High Sensitivity): 11 ng/L (ref <18)

## 2020-01-17 LAB — RESP PANEL BY RT PCR (RSV, FLU A&B, COVID)
Influenza A by PCR: NEGATIVE
Influenza B by PCR: NEGATIVE
Respiratory Syncytial Virus by PCR: NEGATIVE
SARS Coronavirus 2 by RT PCR: NEGATIVE

## 2020-01-17 MED ORDER — SODIUM CHLORIDE 0.9 % IV SOLN: 250.0000 mL | INTRAVENOUS | Status: DC | PRN

## 2020-01-17 MED ORDER — CALCIUM CARBONATE ANTACID 500 MG PO CHEW
2.0000 | CHEWABLE_TABLET | Freq: Every day | ORAL | Status: DC
  Administered 2020-01-18 – 2020-01-22 (×5): 400 mg via ORAL
  Filled 2020-01-17 (×7): qty 2

## 2020-01-17 MED ORDER — INSULIN ASPART 100 UNIT/ML ~~LOC~~ SOLN: 0.0000 [IU] | Freq: Every day | SUBCUTANEOUS | Status: DC

## 2020-01-17 MED ORDER — TORSEMIDE 20 MG PO TABS
20.0000 mg | ORAL_TABLET | Freq: Every day | ORAL | Status: DC
  Administered 2020-01-18: 20 mg via ORAL
  Filled 2020-01-17: qty 1

## 2020-01-17 MED ORDER — ACETAMINOPHEN 325 MG PO TABS
650.0000 mg | ORAL_TABLET | ORAL | Status: DC | PRN
  Administered 2020-01-20 – 2020-01-23 (×5): 650 mg via ORAL
  Filled 2020-01-17 (×6): qty 2

## 2020-01-17 MED ORDER — SODIUM CHLORIDE 0.9% FLUSH: 3.0000 mL | INTRAVENOUS | Status: DC | PRN

## 2020-01-17 MED ORDER — SENNOSIDES-DOCUSATE SODIUM 8.6-50 MG PO TABS
1.0000 | ORAL_TABLET | Freq: Two times a day (BID) | ORAL | Status: DC | PRN
  Administered 2020-01-18: 1 via ORAL
  Filled 2020-01-17 (×2): qty 1

## 2020-01-17 MED ORDER — ASPIRIN 81 MG PO CHEW
81.0000 mg | CHEWABLE_TABLET | Freq: Every day | ORAL | Status: DC
  Administered 2020-01-18 – 2020-01-23 (×6): 81 mg via ORAL
  Filled 2020-01-17 (×6): qty 1

## 2020-01-17 MED ORDER — CARVEDILOL 12.5 MG PO TABS
12.5000 mg | ORAL_TABLET | Freq: Two times a day (BID) | ORAL | Status: DC
  Administered 2020-01-18 – 2020-01-23 (×11): 12.5 mg via ORAL
  Filled 2020-01-17 (×11): qty 1

## 2020-01-17 MED ORDER — GABAPENTIN 300 MG PO CAPS
300.0000 mg | ORAL_CAPSULE | Freq: Two times a day (BID) | ORAL | Status: DC
  Administered 2020-01-18 – 2020-01-23 (×12): 300 mg via ORAL
  Filled 2020-01-17 (×12): qty 1

## 2020-01-17 MED ORDER — HYDRALAZINE HCL 50 MG PO TABS
50.0000 mg | ORAL_TABLET | Freq: Three times a day (TID) | ORAL | Status: DC
  Administered 2020-01-18 – 2020-01-23 (×17): 50 mg via ORAL
  Filled 2020-01-17: qty 2
  Filled 2020-01-17 (×10): qty 1
  Filled 2020-01-17: qty 2
  Filled 2020-01-17 (×5): qty 1

## 2020-01-17 MED ORDER — AMLODIPINE BESYLATE 10 MG PO TABS
10.0000 mg | ORAL_TABLET | Freq: Every day | ORAL | Status: DC
  Administered 2020-01-18 – 2020-01-23 (×6): 10 mg via ORAL
  Filled 2020-01-17 (×2): qty 1
  Filled 2020-01-17: qty 2
  Filled 2020-01-17 (×3): qty 1

## 2020-01-17 MED ORDER — ISOSORBIDE MONONITRATE ER 60 MG PO TB24
60.0000 mg | ORAL_TABLET | Freq: Every evening | ORAL | Status: DC
  Administered 2020-01-18 – 2020-01-22 (×6): 60 mg via ORAL
  Filled 2020-01-17: qty 2
  Filled 2020-01-17 (×5): qty 1

## 2020-01-17 MED ORDER — FUROSEMIDE 10 MG/ML IJ SOLN
40.0000 mg | Freq: Once | INTRAMUSCULAR | Status: AC
  Administered 2020-01-17: 40 mg via INTRAVENOUS
  Filled 2020-01-17: qty 4

## 2020-01-17 MED ORDER — CALCITRIOL 0.25 MCG PO CAPS
0.2500 ug | ORAL_CAPSULE | ORAL | Status: DC
  Administered 2020-01-18 – 2020-01-23 (×3): 0.25 ug via ORAL
  Filled 2020-01-17 (×3): qty 1

## 2020-01-17 MED ORDER — ONDANSETRON HCL 4 MG/2ML IJ SOLN: 4.0000 mg | Freq: Four times a day (QID) | INTRAMUSCULAR | Status: DC | PRN

## 2020-01-17 MED ORDER — HEPARIN SODIUM (PORCINE) 5000 UNIT/ML IJ SOLN
5000.0000 [IU] | Freq: Three times a day (TID) | INTRAMUSCULAR | Status: DC
  Administered 2020-01-17 – 2020-01-22 (×16): 5000 [IU] via SUBCUTANEOUS
  Filled 2020-01-17 (×17): qty 1

## 2020-01-17 MED ORDER — SODIUM CHLORIDE 0.9% FLUSH
3.0000 mL | Freq: Two times a day (BID) | INTRAVENOUS | Status: DC
  Administered 2020-01-17 – 2020-01-22 (×10): 3 mL via INTRAVENOUS

## 2020-01-17 MED ORDER — INSULIN ASPART 100 UNIT/ML ~~LOC~~ SOLN
0.0000 [IU] | Freq: Three times a day (TID) | SUBCUTANEOUS | Status: DC
  Administered 2020-01-18: 2 [IU] via SUBCUTANEOUS
  Administered 2020-01-20: 3 [IU] via SUBCUTANEOUS
  Administered 2020-01-20: 5 [IU] via SUBCUTANEOUS
  Administered 2020-01-21: 3 [IU] via SUBCUTANEOUS
  Administered 2020-01-21 – 2020-01-22 (×3): 5 [IU] via SUBCUTANEOUS

## 2020-01-17 MED ORDER — FERROUS SULFATE 325 (65 FE) MG PO TABS
325.0000 mg | ORAL_TABLET | Freq: Every day | ORAL | Status: DC
  Administered 2020-01-18 – 2020-01-23 (×6): 325 mg via ORAL
  Filled 2020-01-17 (×6): qty 1

## 2020-01-17 MED ORDER — MECLIZINE HCL 25 MG PO TABS
25.0000 mg | ORAL_TABLET | Freq: Three times a day (TID) | ORAL | Status: DC
  Administered 2020-01-18 – 2020-01-23 (×17): 25 mg via ORAL
  Filled 2020-01-17 (×18): qty 1

## 2020-01-17 NOTE — ED Provider Notes (Signed)
[facility] EMERGENCY DEPARTMENT Provider Note   CSN: [id number] Arrival date & time: 01/17/20  1622     History Chief Complaint  Patient presents with  . Shortness of Breath    Melvin Mcclure is a 69 y.o. male.  HPI   This patient is a 69 year old male, he has a known history of anemia, chronic kidney disease with a creatinine just under 4, he is a diabetic with hypertension.  He also is known to have some congestive heart failure, review of the medical record shows his most recent echocardiogram in June 2021 with an ejection fraction of 55 to 60%, moderate left ventricular hypertrophy.  He had been admitted to the hospital in August 2021 approximately 3 weeks ago during which time he was diuresed, he had a right-sided pleural effusion which required thoracentesis, he had some renal insufficiency for which the diuretics were stopped but recently started again because of collection of fluid.  The patient presents today from the cardiology office after being seen and noted to be hypoxic dyspneic and to have dullness to percussion in his right hemithorax concerning for recurrent effusion.  The patient does endorse increasing shortness of breath, his spouse who is an additional historian at the bedside states that last night his oxygen levels dipped into the 70% range.  No fevers, no swelling of the legs, no dysuria or diarrhea.  Past Medical History:  Diagnosis Date  . Anemia in chronic kidney disease (CKD)   . CHF (congestive heart failure) ([place]) 05/28/2018   dx 05/28/18  . Diabetes mellitus without complication ([place])   . Hyperlipidemia   . Hypertension   . Iron deficiency anemia   . Perforating neurotrophic ulcer of foot [facility])     Patient Active Problem List   Diagnosis Date Noted  . Acute exacerbation of CHF (congestive heart failure) ([place]) 12/23/2019  . Hypocalcemia 12/23/2019  . Anemia of chronic disease 12/23/2019  . Vertigo 12/23/2019  . Acute on chronic  respiratory failure with hypoxia ([place]) 12/23/2019  . CAP (community acquired pneumonia) 10/28/2019  . Hypertensive cardiomyopathy, with heart failure ([place]) 06/15/2018  . Acute diastolic CHF (congestive heart failure) ([place]) 05/28/2018  . Type 2 diabetes mellitus with stage 3 chronic kidney disease ([place]) 05/28/2018  . CKD stage 4 due to type 2 diabetes mellitus ([place]) 05/28/2018  . CHF (congestive heart failure) ([place]) 05/28/2018    Past Surgical History:  Procedure Laterality Date  . ACHILLES TENDON REPAIR    . CYST EXCISION    . IR THORACENTESIS ASP PLEURAL SPACE W/IMG GUIDE  12/26/2019       Family History  Problem Relation Age of Onset  . Diabetes Mellitus II Mother   . Hypertension Father     Social History   Tobacco Use  . Smoking status: Never Smoker  . Smokeless tobacco: Never Used  Vaping Use  . Vaping Use: Never used  Substance Use Topics  . Alcohol use: No    Alcohol/week: 0.0 standard drinks  . Drug use: No    Home Medications Prior to Admission medications   Medication Sig Start Date End Date Taking? Authorizing Provider  amLODipine (NORVASC) 10 MG tablet Take 10 mg by mouth daily.    [provider]  aspirin 81 MG tablet Take 81 mg by mouth daily.    [provider]  calcitRIOL (ROCALTROL) 0.25 MCG capsule Take 0.25 mcg by mouth 3 (three) times a week. Monday, Wednesday, Friday 10/18/19   [provider]  calcium carbonate (TUMS - DOSED IN MG ELEMENTAL CALCIUM) 500 MG chewable tablet Chew 2 tablets by mouth daily.     [provider]  carvedilol (COREG) 12.5 MG tablet Take 1 tablet (12.5 mg total) by mouth 2 (two) times daily with a meal. 12/31/19   Eulogio Bear U, DO  diclofenac Sodium (VOLTAREN) 1 % GEL Apply 2 g topically 4 (four) times daily. 12/31/19   Geradine Girt, DO  ferrous sulfate 325 (65 FE) MG tablet Take 325 mg by mouth daily with breakfast.    [provider]  gabapentin (NEURONTIN) 300 MG capsule Take  1 capsule (300 mg total) by mouth 2 (two) times daily. 12/31/19   Geradine Girt, DO  hydrALAZINE (APRESOLINE) 50 MG tablet Take 1 tablet (50 mg total) by mouth every 8 (eight) hours. Patient taking differently: Take 50 mg by mouth 3 (three) times daily.  06/04/18   Dessa Phi, DO  isosorbide mononitrate (IMDUR) 60 MG 24 hr tablet Take 1 tablet (60 mg total) by mouth every evening. 12/31/19   Geradine Girt, DO  meclizine (ANTIVERT) 25 MG tablet Take 1 tablet (25 mg total) by mouth 3 (three) times daily. 12/31/19   Geradine Girt, DO  senna-docusate (SENOKOT-S) 8.6-50 MG tablet Take 1 tablet by mouth 2 (two) times daily.    [provider]  torsemide (DEMADEX) 20 MG tablet Take 20 mg by mouth daily. 01/01/20   [provider]    Allergies    Patient has no known allergies.  Review of Systems   Review of Systems  All other systems reviewed and are negative.   Physical Exam Updated Vital Signs BP 137/74 (BP Location: Left Arm)   Pulse 63   Temp 98.2 F (36.8 C) (Oral)   Resp 18   Ht 1.778 m (5\' 10" )   Wt 90.7 kg   SpO2 (!) 88%   BMI 28.70 kg/m   Physical Exam Vitals and nursing note reviewed.  Constitutional:      General: He is in acute distress.     Appearance: He is well-developed.  HENT:     Head: Normocephalic and atraumatic.     Mouth/Throat:     Pharynx: No oropharyngeal exudate.  Eyes:     General: No scleral icterus.       Right eye: No discharge.        Left eye: No discharge.     Conjunctiva/sclera: Conjunctivae normal.     Pupils: Pupils are equal, round, and reactive to light.  Neck:     Thyroid: No thyromegaly.     Vascular: No JVD.  Cardiovascular:     Rate and Rhythm: Normal rate and regular rhythm.     Heart sounds: Normal heart sounds. No murmur heard.  No friction rub. No gallop.   Pulmonary:     Effort: Tachypnea and respiratory distress present.     Breath sounds: No wheezing or rales.     Comments: Decreased breath sounds  on the right side, tachypnea present, speaks in shortened sentences, no wheezing or rales Abdominal:     General: Bowel sounds are normal. There is no distension.     Palpations: Abdomen is soft. There is no mass.     Tenderness: There is no abdominal tenderness.  Musculoskeletal:        General: No tenderness. Normal range of motion.     Cervical back: Normal range of motion and neck supple.  Lymphadenopathy:  Cervical: No cervical adenopathy.  Skin:    General: Skin is warm and dry.     Findings: No erythema or rash.  Neurological:     Mental Status: He is alert.     Coordination: Coordination normal.  Psychiatric:        Behavior: Behavior normal.     ED Results / Procedures / Treatments   Labs (all labs ordered are listed, but only abnormal results are displayed) Labs Reviewed  BASIC METABOLIC PANEL - Abnormal; Notable for the following components:      Result Value   Glucose, Bld 156 (*)    BUN 65 (*)    Creatinine, Ser 3.64 (*)    Calcium 7.1 (*)    GFR calc non Af Amer 16 (*)    GFR calc Af Amer 18 (*)    All other components within normal limits  CBC - Abnormal; Notable for the following components:   RBC 4.09 (*)    Hemoglobin 11.1 (*)    HCT 37.1 (*)    MCHC 29.9 (*)    RDW 16.9 (*)    All other components within normal limits  TROPONIN I (HIGH SENSITIVITY)  TROPONIN I (HIGH SENSITIVITY)    EKG EKG Interpretation  Date/Time:  Tuesday January 17 2020 19:20:05 EDT Ventricular Rate:  60 PR Interval:    QRS Duration: 140 QT Interval:  514 QTC Calculation: 514 R Axis:   -58 Text Interpretation: Sinus rhythm RBBB and LAFB Abnormal T, consider ischemia, lateral leads since last tracing no significant change Confirmed by Noemi Chapel [phone]) on 01/17/2020 7:39:49 PM   Radiology DG Chest 2 View  Result Date: 01/17/2020 CLINICAL DATA:  Shortness of breath EXAM: CHEST - 2 VIEW COMPARISON:  December 26, 2019 FINDINGS: The heart size and mediastinal  contours are unchanged with mild cardiomegaly. A moderate right pleural effusion is seen. There is patchy airspace opacity at the right lung base which could be due to atelectasis or layering effusion. The left lung is clear. No acute osseous abnormality. IMPRESSION: Moderate right pleural effusion with adjacent patchy airspace opacity which could be atelectasis/layering effusion. Electronically Signed   By: Prudencio Pair M.D.   On: 01/17/2020 17:13    Procedures .Critical Care Performed by: Noemi Chapel, MD Authorized by: Noemi Chapel, MD   Critical care provider statement:    Critical care time (minutes):  35   Critical care time was exclusive of:  Separately billable procedures and treating other patients and teaching time   Critical care was necessary to treat or prevent imminent or life-threatening deterioration of the following conditions:  Respiratory failure   Critical care was time spent personally by me on the following activities:  Blood draw for specimens, development of treatment plan with patient or surrogate, discussions with consultants, evaluation of patient's response to treatment, examination of patient, obtaining history from patient or surrogate, ordering and performing treatments and interventions, ordering and review of laboratory studies, ordering and review of radiographic studies, pulse oximetry, re-evaluation of patient's condition and review of old charts   (including critical care time)  Medications Ordered in ED Medications - No data to display  ED Course  I have reviewed the triage vital signs and the nursing notes.  Pertinent labs & imaging results that were available during my care of the patient were reviewed by me and considered in my medical decision making (see chart for details).    MDM Rules/Calculators/A&P  Laboratory work-up is remarkable for a creatinine of 3.6 which seems close to baseline.  CBC shows chronic anemia,  hemoglobin of 11.1, no leukocytosis, normal platelets.  I personally viewed and interpreted his chest x-ray which is a 2 view PA and lateral.  There does appear to be a moderate sized right-sided pleural effusion with some atelectasis, left lung appears clear, mediastinum appears unremarkable accounting for the effusion.  No skin or soft tissue significant abnormalities and no subdiaphragmatic air.  Impression right-sided pleural effusion.  The patient's oxygenation is low, he is requiring 2 L by nasal cannula, he will likely need to be readmitted to the hospital, diuresed and have thoracentesis.  Will discuss with the hospitalist for admission.  The patient is agreeable  The patient is critically ill with acute hypoxic respiratory failure secondary to an effusion.  Question whether this is related to congestive heart failure given his recent echocardiogram  D/w hospitalist Dr. Alcario Drought who will come to admit to the hospital.  Final Clinical Impression(s) / ED Diagnoses Final diagnoses:  Acute respiratory failure with hypoxia ([place])  Pleural effusion, right     Noemi Chapel, MD 01/17/20 2015

## 2020-01-17 NOTE — Telephone Encounter (Signed)
Follow Up:    Returning Melvin Mcclure's call from this morning.

## 2020-01-17 NOTE — Telephone Encounter (Signed)
Added to office schedule today - sent to ER for recurrent pleural effusion and hypoxia.  Dr Lemmie Evens

## 2020-01-17 NOTE — Progress Notes (Signed)
OFFICE NOTE  Chief Complaint:  Shortness of breath, hypoxia  Primary Care Physician: Nolene Ebbs, MD  HPI:  Melvin Mcclure is a 69 y.o. male with a past medial history significant for chronic kidney disease, diastolic congestive heart failure, type 2 diabetes, hypertension and dyslipidemia who is a patient of Dr. Stanford Breed.  He was recently admitted to the hospital in August with overload concerning for diastolic congestive heart failure.  He was also noted to have a right-sided pleural effusion.  He underwent a thoracentesis and was notably hypoxic with oxygen saturations in the low 80% range.  He was diuresed aggressively and his initial weight was 95 kg but his dry weight was thought to be 87 kg.  After aggressive diuresis, he developed acute kidney injury and was seen by nephrology.  They recommended holding off of his diuretics to allow renal recovery and he already has had some improvement in his creatinine.  He was seen by his nephrologist Dr. Royce Macadamia a few days ago and his diuretics were resumed initially at 40 mg and then reduce to 20 mg for maintenance of torsemide noting an improvement in his creatinine.  Today presents with progressive worsening dyspnea and hypoxia.  His wife is nurse and noted that his oxygen saturations had been in the 80s.  In the office today he was 80% on room air.  He was quickly placed on supplemental oxygen which came up to 94% on 2 L nasal cannula.  Blood pressure is elevated 160/70.  He reported recent progressive shortness of breath and chest discomfort.  He was not requiring oxygen at home and did not have an oxygen requirement at discharge.  PMHx:  Past Medical History:  Diagnosis Date  . Anemia in chronic kidney disease (CKD)   . CHF (congestive heart failure) ([place]) 05/28/2018   dx 05/28/18  . Diabetes mellitus without complication ([place])   . Hyperlipidemia   . Hypertension   . Iron deficiency anemia   . Perforating neurotrophic ulcer of foot ([place])      Past Surgical History:  Procedure Laterality Date  . ACHILLES TENDON REPAIR    . CYST EXCISION    . IR THORACENTESIS ASP PLEURAL SPACE W/IMG GUIDE  12/26/2019    FAMHx:  Family History  Problem Relation Age of Onset  . Diabetes Mellitus II Mother   . Hypertension Father     SOCHx:   reports that he has never smoked. He has never used smokeless tobacco. He reports that he does not drink alcohol and does not use drugs.  ALLERGIES:  No Known Allergies  ROS: Pertinent items noted in HPI and remainder of comprehensive ROS otherwise negative.  HOME MEDS: Current Outpatient Medications on File Prior to Visit  Medication Sig Dispense Refill  . amLODipine (NORVASC) 10 MG tablet Take 10 mg by mouth daily.    Marland Kitchen aspirin 81 MG tablet Take 81 mg by mouth daily.    . calcitRIOL (ROCALTROL) 0.25 MCG capsule Take 0.25 mcg by mouth 3 (three) times a week. Monday, Wednesday, Friday    . calcium carbonate (TUMS - DOSED IN MG ELEMENTAL CALCIUM) 500 MG chewable tablet Chew 2 tablets by mouth daily.     . carvedilol (COREG) 12.5 MG tablet Take 1 tablet (12.5 mg total) by mouth 2 (two) times daily with a meal. 60 tablet 0  . diclofenac Sodium (VOLTAREN) 1 % GEL Apply 2 g topically 4 (four) times daily.    . ferrous sulfate 325 (65 FE) MG tablet  Take 325 mg by mouth daily with breakfast.    . gabapentin (NEURONTIN) 300 MG capsule Take 1 capsule (300 mg total) by mouth 2 (two) times daily. 60 capsule 0  . hydrALAZINE (APRESOLINE) 50 MG tablet Take 1 tablet (50 mg total) by mouth every 8 (eight) hours. (Patient taking differently: Take 50 mg by mouth 3 (three) times daily. ) 90 tablet 0  . isosorbide mononitrate (IMDUR) 60 MG 24 hr tablet Take 1 tablet (60 mg total) by mouth every evening. 30 tablet 0  . meclizine (ANTIVERT) 25 MG tablet Take 1 tablet (25 mg total) by mouth 3 (three) times daily. 30 tablet 0  . senna-docusate (SENOKOT-S) 8.6-50 MG tablet Take 1 tablet by mouth 2 (two) times daily.      Marland Kitchen torsemide (DEMADEX) 20 MG tablet Take 20 mg by mouth daily.     No current facility-administered medications on file prior to visit.    LABS/IMAGING: No results found for this or any previous visit (from the past 48 hour(s)). No results found.  LIPID PANEL: No results found for: CHOL, TRIG, HDL, CHOLHDL, VLDL, LDLCALC, LDLDIRECT   WEIGHTS: Wt Readings from Last 3 Encounters:  01/17/20 200 lb (90.7 kg)  01/17/20 210 lb (95.3 kg)  12/28/19 188 lb 1.6 oz (85.3 kg)    VITALS: BP (!) 160/70   Pulse 64   Ht 5\' 10"  (1.778 m)   Wt 210 lb (95.3 kg)   SpO2 94% Comment: on 2L  BMI 30.13 kg/m   EXAM: General appearance: alert and mild distress Neck: JVD - a few cm above sternal notch, no carotid bruit and thyroid not enlarged, symmetric, no tenderness/mass/nodules Lungs: diminished breath sounds RLL and dullness to percussion RLL Heart: regular rate and rhythm Abdomen: soft, non-tender; bowel sounds normal; no masses,  no organomegaly Extremities: extremities normal, atraumatic, no cyanosis or edema Pulses: 2+ and symmetric Skin: Skin color, texture, turgor normal. No rashes or lesions Neurologic: Mental status: Fatigued appearing Psych: Pleasant  EKG: Normal sinus rhythm at 64, bifascicular block, minimal voltage criteria for LVH with lateral T wave changes- personally reviewed  ASSESSMENT: 1. Acute hypoxic respiratory failure 2. Acute on chronic diastolic congestive heart failure 3. Recurrent right pleural effusion suspected 4. CKD 3-4  PLAN: 1.   Mr. Borowski has acute hypoxic respiratory failure with oxygen saturation in the 80s today including significant fatigue and progressive worsening dyspnea.  He was off of diuretics for 2 weeks according to his wife because of acute kidney injury after his recent hospitalization however appears to have reaccumulated fluid despite recently restarting diuretics at the recommendation of his nephrologist.  On exam he has dullness to  percussion about a third of the way up of the right lung suspicious for recurrent right pleural effusion.  He did have thoracentesis about 1 month ago almost during his hospitalization.  Because of the significant hypoxia, I think he will need oxygen, repeat chest x-ray and lab work, all of which is best accomplished in the hospital.  We have directed him to the emergency department and notify them of his arrival.  Pixie Casino, MD, [facility], [place] Director of the [place] of the [employer] Board of Clinical Lipidology Attending Cardiologist  Direct Dial: [phone]  Fax: [phone]  Website:  [email or web address] 01/17/2020, 4:45 PM

## 2020-01-17 NOTE — ED Triage Notes (Signed)
Pt. Stated, Ive had SOB for a week. No pain.

## 2020-01-17 NOTE — Telephone Encounter (Signed)
Called and spoke with pt's wife. (also see mychart messages) She reports that for the last 2 days pt has had SOB and he is currently sating at 89%. This morning he was sating in the lower 80s. (pt's wife is a Marine scientist and she had him do some incentive spirometry.) she states that he was offered home O2 when D/c'd from the hospital but he passed the walking test.  She also reports he has had labs from a few weeks ago and they showed an acute kidney injury.  She reports that he needs to be seen sooner than his upcoming appt due to her husbands condition. Able to schedule pt on DOD slot with Dr.Hilty today at 2:45pm Wife verbalized understanding with no other questions at this time.

## 2020-01-17 NOTE — Patient Instructions (Signed)
Dr. Debara Pickett advises that you go to [facility] ED for low oxygen levels (80% on room air, up to 95% on 2L oxygen), heart failure

## 2020-01-17 NOTE — Telephone Encounter (Signed)
Was a stat call but could not get in contact with triage

## 2020-01-17 NOTE — ED Notes (Signed)
Initial spo2 85% increase to 96% on 2LPM

## 2020-01-17 NOTE — Telephone Encounter (Addendum)
Only one nurse in Triage at this time. LVM2CB at 8:42am, also sent mychart message

## 2020-01-17 NOTE — H&P (Signed)
History and Physical    Melvin Mcclure HFW:[id number] DOB: 26-Mar-1951 DOA: 01/17/2020  PCP: Nolene Ebbs, MD  Patient coming from: Home  I have personally briefly reviewed patient's old medical records in [place]  Chief Complaint: SOB  HPI: Melvin Mcclure is a 69 y.o. male with medical history significant of CKD 4, dCHF, HTN, DM2.  Recent admit to hospital in August for CHF and R pleural effusion.  IV diuretics caused AKI with creat up to 4.3 at time of discharge so diuretics held at DC.  Also had thoracentesis during that admit, effusion was transudative.  Creat started improving some after discharge and started back on torsemide a couple of days ago, initially 40 now 20mg  PO daily.  Went to cardiologist office today with SOB.  Noted to have recurrent pleural effusion on exam and hypoxic to 80s on RA, improved to mid 90s on just 2L via [place].   ED Course: CXR confirms recurrent moderate R pleural effusion.  Satting mid 90s on 2L via [place].  Creat 3.6.  Hospitalist asked to admit.   Review of Systems: As per HPI, otherwise all review of systems negative.  Past Medical History:  Diagnosis Date  . Anemia in chronic kidney disease (CKD)   . CHF (congestive heart failure) ([place]) 05/28/2018   dx 05/28/18  . Diabetes mellitus without complication ([place])   . Hyperlipidemia   . Hypertension   . Iron deficiency anemia   . Perforating neurotrophic ulcer of foot ([place])     Past Surgical History:  Procedure Laterality Date  . ACHILLES TENDON REPAIR    . CYST EXCISION    . IR THORACENTESIS ASP PLEURAL SPACE W/IMG GUIDE  12/26/2019     reports that he has never smoked. He has never used smokeless tobacco. He reports that he does not drink alcohol and does not use drugs.  No Known Allergies  Family History  Problem Relation Age of Onset  . Diabetes Mellitus II Mother   . Hypertension Father      Prior to Admission medications   Medication Sig Start Date End Date Taking?  Authorizing Provider  amLODipine (NORVASC) 10 MG tablet Take 10 mg by mouth daily.    [provider]  aspirin 81 MG tablet Take 81 mg by mouth daily.    [provider]  calcitRIOL (ROCALTROL) 0.25 MCG capsule Take 0.25 mcg by mouth 3 (three) times a week. Monday, Wednesday, Friday 10/18/19   [provider]  calcium carbonate (TUMS - DOSED IN MG ELEMENTAL CALCIUM) 500 MG chewable tablet Chew 2 tablets by mouth daily.     [provider]  carvedilol (COREG) 12.5 MG tablet Take 1 tablet (12.5 mg total) by mouth 2 (two) times daily with a meal. 12/31/19   Eulogio Bear U, DO  diclofenac Sodium (VOLTAREN) 1 % GEL Apply 2 g topically 4 (four) times daily. 12/31/19   Geradine Girt, DO  ferrous sulfate 325 (65 FE) MG tablet Take 325 mg by mouth daily with breakfast.    [provider]  gabapentin (NEURONTIN) 300 MG capsule Take 1 capsule (300 mg total) by mouth 2 (two) times daily. 12/31/19   Geradine Girt, DO  hydrALAZINE (APRESOLINE) 50 MG tablet Take 1 tablet (50 mg total) by mouth every 8 (eight) hours. Patient taking differently: Take 50 mg by mouth 3 (three) times daily.  06/04/18   Dessa Phi, DO  isosorbide mononitrate (IMDUR) 60 MG 24 hr tablet Take 1 tablet (60 mg  total) by mouth every evening. 12/31/19   Geradine Girt, DO  meclizine (ANTIVERT) 25 MG tablet Take 1 tablet (25 mg total) by mouth 3 (three) times daily. 12/31/19   Geradine Girt, DO  senna-docusate (SENOKOT-S) 8.6-50 MG tablet Take 1 tablet by mouth 2 (two) times daily.    [provider]  torsemide (DEMADEX) 20 MG tablet Take 20 mg by mouth daily. 01/01/20   [provider]    Physical Exam: Vitals:   01/17/20 1626 01/17/20 1643 01/17/20 1930 01/17/20 1945  BP: 137/74  132/67   Pulse: 63   60  Resp: 18  20   Temp: 98.2 F (36.8 C)     TempSrc: Oral     SpO2: (!) 88%  97%   Weight:  90.7 kg    Height:  5\' 10"  (1.778 m)      Constitutional: NAD, calm,  comfortable, sleepy Eyes: PERRL, lids and conjunctivae normal ENMT: Mucous membranes are moist. Posterior pharynx clear of any exudate or lesions.Normal dentition.  Neck: normal, supple, no masses, no thyromegaly Respiratory: diminished breath sounds on R, faint expiratory wheezes on L.  Mildly increased WOB with accessory muscle use noted. Cardiovascular: Regular rate and rhythm, no murmurs / rubs / gallops. 1+ BLE edema. 2+ pedal pulses. No carotid bruits.  Abdomen: no tenderness, no masses palpated. No hepatosplenomegaly. Bowel sounds positive.  Musculoskeletal: no clubbing / cyanosis. No joint deformity upper and lower extremities. Good ROM, no contractures. Normal muscle tone.  Skin: no rashes, lesions, ulcers. No induration Neurologic: CN 2-12 grossly intact. Sensation intact, DTR normal. Strength 5/5 in all 4.  Psychiatric: Normal judgment and insight. Alert and oriented x 3. Normal mood.    Labs on Admission: I have personally reviewed following labs and imaging studies  CBC: Recent Labs  Lab 01/17/20 1718  WBC 7.0  HGB 11.1*  HCT 37.1*  MCV 90.7  PLT 778   Basic Metabolic Panel: Recent Labs  Lab 01/17/20 1718  NA 142  K 4.3  CL 109  CO2 23  GLUCOSE 156*  BUN 65*  CREATININE 3.64*  CALCIUM 7.1*   GFR: Estimated Creatinine Clearance: 21.4 mL/min (A) (by C-G formula based on SCr of 3.64 mg/dL (H)). Liver Function Tests: No results for input(s): AST, ALT, ALKPHOS, BILITOT, PROT, ALBUMIN in the last 168 hours. No results for input(s): LIPASE, AMYLASE in the last 168 hours. No results for input(s): AMMONIA in the last 168 hours. Coagulation Profile: No results for input(s): INR, PROTIME in the last 168 hours. Cardiac Enzymes: No results for input(s): CKTOTAL, CKMB, CKMBINDEX, TROPONINI in the last 168 hours. BNP (last 3 results) No results for input(s): PROBNP in the last 8760 hours. HbA1C: No results for input(s): HGBA1C in the last 72 hours. CBG: No results  for input(s): GLUCAP in the last 168 hours. Lipid Profile: No results for input(s): CHOL, HDL, LDLCALC, TRIG, CHOLHDL, LDLDIRECT in the last 72 hours. Thyroid Function Tests: No results for input(s): TSH, T4TOTAL, FREET4, T3FREE, THYROIDAB in the last 72 hours. Anemia Panel: No results for input(s): VITAMINB12, FOLATE, FERRITIN, TIBC, IRON, RETICCTPCT in the last 72 hours. Urine analysis:    Component Value Date/Time   COLORURINE YELLOW 12/28/2019 1850   APPEARANCEUR CLEAR 12/28/2019 1850   LABSPEC 1.014 12/28/2019 1850   PHURINE 5.0 12/28/2019 1850   GLUCOSEU NEGATIVE 12/28/2019 1850   HGBUR NEGATIVE 12/28/2019 1850   BILIRUBINUR NEGATIVE 12/28/2019 [place] 12/28/2019 1850   PROTEINUR 30 (A)  12/28/2019 1850   UROBILINOGEN 0.2 06/17/2010 1620   NITRITE NEGATIVE 12/28/2019 1850   LEUKOCYTESUR NEGATIVE 12/28/2019 1850    Radiological Exams on Admission: DG Chest 2 View  Result Date: 01/17/2020 CLINICAL DATA:  Shortness of breath EXAM: CHEST - 2 VIEW COMPARISON:  December 26, 2019 FINDINGS: The heart size and mediastinal contours are unchanged with mild cardiomegaly. A moderate right pleural effusion is seen. There is patchy airspace opacity at the right lung base which could be due to atelectasis or layering effusion. The left lung is clear. No acute osseous abnormality. IMPRESSION: Moderate right pleural effusion with adjacent patchy airspace opacity which could be atelectasis/layering effusion. Electronically Signed   By: Prudencio Pair M.D.   On: 01/17/2020 17:13    EKG: Independently reviewed.  Assessment/Plan Principal Problem:   Acute respiratory failure with hypoxia (HCC) Active Problems:   Acute diastolic CHF (congestive heart failure) (HCC)   DM2 (diabetes mellitus, type 2) ([place])   CKD stage 4 due to type 2 diabetes mellitus ([place])   Hypertensive cardiomyopathy, with heart failure (HCC)   Recurrent right pleural effusion   HTN (hypertension)    1. Acute  respiratory failure with hypoxia - 1. Due to [facility] and recurrent R pleural effusion 2. 2L via [place] 3. Cont pulse ox 4. Treatment of each cause as below 2. Recurrent r pleural effusion - 1. Suspected to be due to dCHF 1. Lab work on fluid confirmed this to be transudate last admission.  This would be more c/w CHF than other causes. 2. Repeat thoracentesis by IR in AM 3. Acute on chronic diastolic CHF - 1. CHF pathway 2. Getting 40mg  IV lasix x1 now 3. Tele monitor 4. Cont home meds for the moment 5. Holding off on further lasix or med changes until cardiology and probably nephrology can weigh in on the patient's situation (CHF and renal function) in the morning. 6. Message sent to P. Trent for cards eval in AM 4. CKD 4 - 1. Creat today of 3.6 is better than the 4.3 he was discharged with, but not as good as his 2.7 baseline. 2. Probably will want nephro consult in AM 5. HTN - 1. Cont home BP meds for now 6. DM2 - 1. Mod scale SSI AC/HS  DVT prophylaxis: Heparin [place] Code Status: Full Family Communication: Wife at bedside Disposition Plan: Home after CHF stabilized, breathing improved, and kidney function stable Consults called: Message sent to P. Abagail Kitchens for cards consult in AM Admission status: Place in [place], [place] Hospitalists  How to contact the [facility] Attending or Consulting provider [place] or covering provider during after hours [place], for this patient?  1. Check the care team in [facility] and look for a) attending/consulting [facility] provider listed and b) the [facility] team listed 2. Log into [email or web address]  Amion Physician Scheduling and messaging for groups and whole hospitals  On call and physician scheduling software for group practices, residents, hospitalists and other medical providers for call, clinic, rotation and shift schedules. On[employer] is a hospital-wide system for scheduling doctors and paging doctors on call. EasyPlot is for scientific plotting and  data analysis.  [email or web address]  and use [place]'s universal password to access. If you do not have the password, please contact the hospital operator.  3. Locate the [facility] provider you are looking for under [facility] and page to a number that you can be directly reached. 4. If you still have  difficulty reaching the provider, please page the [facility] (Director on Call) for the Hospitalists listed on amion for assistance.  01/17/2020, 9:22 PM

## 2020-01-17 NOTE — Telephone Encounter (Signed)
Pt c/o Shortness Of Breath: STAT if SOB developed within the last 24 hours or pt is noticeably SOB on the phone  1. Are you currently SOB (can you hear that pt is SOB on the phone)? Yes   2. How long have you been experiencing SOB? Yesterday  3. Are you SOB when sitting or when up moving around? Both   4. Are you currently experiencing any other symptoms? Wheezing

## 2020-01-17 NOTE — ED Notes (Signed)
Patient transported to X-ray 

## 2020-01-18 ENCOUNTER — Observation Stay (HOSPITAL_COMMUNITY): Payer: Medicare Other

## 2020-01-18 ENCOUNTER — Encounter (HOSPITAL_COMMUNITY): Payer: Self-pay | Admitting: Internal Medicine

## 2020-01-18 ENCOUNTER — Ambulatory Visit: Payer: Medicare Other | Admitting: General Practice

## 2020-01-18 DIAGNOSIS — J9601 Acute respiratory failure with hypoxia: Secondary | ICD-10-CM | POA: Diagnosis present

## 2020-01-18 DIAGNOSIS — E8889 Other specified metabolic disorders: Secondary | ICD-10-CM | POA: Diagnosis present

## 2020-01-18 DIAGNOSIS — I5020 Unspecified systolic (congestive) heart failure: Secondary | ICD-10-CM | POA: Diagnosis not present

## 2020-01-18 DIAGNOSIS — E785 Hyperlipidemia, unspecified: Secondary | ICD-10-CM | POA: Diagnosis present

## 2020-01-18 DIAGNOSIS — Z20822 Contact with and (suspected) exposure to covid-19: Secondary | ICD-10-CM | POA: Diagnosis present

## 2020-01-18 DIAGNOSIS — D631 Anemia in chronic kidney disease: Secondary | ICD-10-CM | POA: Diagnosis present

## 2020-01-18 DIAGNOSIS — E1100 Type 2 diabetes mellitus with hyperosmolarity without nonketotic hyperglycemic-hyperosmolar coma (NKHHC): Secondary | ICD-10-CM | POA: Diagnosis not present

## 2020-01-18 DIAGNOSIS — N2581 Secondary hyperparathyroidism of renal origin: Secondary | ICD-10-CM | POA: Diagnosis present

## 2020-01-18 DIAGNOSIS — Z833 Family history of diabetes mellitus: Secondary | ICD-10-CM | POA: Diagnosis not present

## 2020-01-18 DIAGNOSIS — Z79899 Other long term (current) drug therapy: Secondary | ICD-10-CM | POA: Diagnosis not present

## 2020-01-18 DIAGNOSIS — I509 Heart failure, unspecified: Secondary | ICD-10-CM | POA: Diagnosis present

## 2020-01-18 DIAGNOSIS — N179 Acute kidney failure, unspecified: Secondary | ICD-10-CM | POA: Diagnosis present

## 2020-01-18 DIAGNOSIS — I5031 Acute diastolic (congestive) heart failure: Secondary | ICD-10-CM | POA: Diagnosis not present

## 2020-01-18 DIAGNOSIS — N184 Chronic kidney disease, stage 4 (severe): Secondary | ICD-10-CM | POA: Diagnosis present

## 2020-01-18 DIAGNOSIS — I5033 Acute on chronic diastolic (congestive) heart failure: Secondary | ICD-10-CM | POA: Diagnosis present

## 2020-01-18 DIAGNOSIS — Z7982 Long term (current) use of aspirin: Secondary | ICD-10-CM | POA: Diagnosis not present

## 2020-01-18 DIAGNOSIS — I130 Hypertensive heart and chronic kidney disease with heart failure and stage 1 through stage 4 chronic kidney disease, or unspecified chronic kidney disease: Secondary | ICD-10-CM | POA: Diagnosis present

## 2020-01-18 DIAGNOSIS — R188 Other ascites: Secondary | ICD-10-CM | POA: Diagnosis present

## 2020-01-18 DIAGNOSIS — E1122 Type 2 diabetes mellitus with diabetic chronic kidney disease: Secondary | ICD-10-CM

## 2020-01-18 DIAGNOSIS — E46 Unspecified protein-calorie malnutrition: Secondary | ICD-10-CM | POA: Diagnosis present

## 2020-01-18 DIAGNOSIS — J918 Pleural effusion in other conditions classified elsewhere: Secondary | ICD-10-CM | POA: Diagnosis present

## 2020-01-18 DIAGNOSIS — Z8249 Family history of ischemic heart disease and other diseases of the circulatory system: Secondary | ICD-10-CM | POA: Diagnosis not present

## 2020-01-18 HISTORY — PX: IR THORACENTESIS ASP PLEURAL SPACE W/IMG GUIDE: IMG5380

## 2020-01-18 LAB — BASIC METABOLIC PANEL
Anion gap: 8 (ref 5–15)
BUN: 68 mg/dL — ABNORMAL HIGH (ref 8–23)
CO2: 25 mmol/L (ref 22–32)
Calcium: 6.8 mg/dL — ABNORMAL LOW (ref 8.9–10.3)
Chloride: 107 mmol/L (ref 98–111)
Creatinine, Ser: 3.49 mg/dL — ABNORMAL HIGH (ref 0.61–1.24)
GFR calc Af Amer: 19 mL/min — ABNORMAL LOW (ref >60)
GFR calc non Af Amer: 17 mL/min — ABNORMAL LOW (ref >60)
Glucose, Bld: 224 mg/dL — ABNORMAL HIGH (ref 70–99)
Potassium: 4.4 mmol/L (ref 3.5–5.1)
Sodium: 140 mmol/L (ref 135–145)

## 2020-01-18 LAB — GLUCOSE, CAPILLARY
Glucose-Capillary: 114 mg/dL — ABNORMAL HIGH (ref 70–99)
Glucose-Capillary: 170 mg/dL — ABNORMAL HIGH (ref 70–99)

## 2020-01-18 LAB — CBG MONITORING, ED
Glucose-Capillary: 125 mg/dL — ABNORMAL HIGH (ref 70–99)
Glucose-Capillary: 67 mg/dL — ABNORMAL LOW (ref 70–99)
Glucose-Capillary: 88 mg/dL (ref 70–99)

## 2020-01-18 MED ORDER — FUROSEMIDE 10 MG/ML IJ SOLN
80.0000 mg | Freq: Two times a day (BID) | INTRAMUSCULAR | Status: AC
  Administered 2020-01-18 – 2020-01-21 (×8): 80 mg via INTRAVENOUS
  Filled 2020-01-18 (×8): qty 8

## 2020-01-18 MED ORDER — LIDOCAINE HCL (PF) 1 % IJ SOLN
INTRAMUSCULAR | Status: DC | PRN
  Administered 2020-01-18: 10 mL

## 2020-01-18 MED ORDER — METOLAZONE 5 MG PO TABS
5.0000 mg | ORAL_TABLET | ORAL | Status: AC
  Administered 2020-01-18: 5 mg via ORAL
  Filled 2020-01-18: qty 1

## 2020-01-18 MED ORDER — LIDOCAINE HCL 1 % IJ SOLN
INTRAMUSCULAR | Status: AC
  Filled 2020-01-18: qty 20

## 2020-01-18 NOTE — ED Notes (Signed)
Pt with good pleth at 88-91, moved to 4L.

## 2020-01-18 NOTE — Progress Notes (Signed)
[facility]                                                                              Patient Demographics  Melvin Mcclure, is a 69 y.o. male, DOB - 1951-03-31, LKG:[id number]  Admit date - 01/17/2020   Admitting Physician Etta Quill, DO  Outpatient Primary MD for the patient is Nolene Ebbs, MD  Outpatient specialists:   LOS - 0  days   Medical records reviewed and are as summarized below:    Chief Complaint  Patient presents with  . Shortness of Breath       Brief summary   Patient is a 69 year old male with history of CKD stage IV, diastolic CHF, hypertension, diabetes mellitus type 2.  Patient recently admitted last month for CHF, right pleural effusion.  He was placed on IV diuretics caused AKI with creatinine up to 4.3 at the time of discharge.  Also had thoracentesis, effusion was transudative.  Patient went to his cardiologist office on the day of admission for shortness of breath, noted to have recurrent pleural effusion on exam and hypoxia to 80s on room air, improved to mid 90s on 2 L via nasal cannula in ED.   Assessment & Plan    Principal Problem:   Acute respiratory failure with hypoxia (HCC) due to underlying acute on chronic diastolic CHF, recurrent right-sided pleural effusion -Continue O2, wean as tolerated.  Patient is on torsemide 20 mg daily at home. -Appears to have anasarca with abdominal bloating, possible ascites, follow abdominal ultrasound -Consulted cardiology, also requested consult with nephrology, discussed with Dr. Royce Macadamia  Active Problems: Recurrent right-sided pleural effusion -IR consulted, status post right-sided thoracentesis, 1.75 L removed     DM2 (diabetes mellitus, type 2) (HCC) -CBG low 67, continue sliding scale insulin    CKD stage 4 due to type 2 diabetes mellitus ([place]) -4.34 on discharge 8/28, diuretics were held - nephrology has been consulted, patient follows Dr. Royce Macadamia outpatient  Essential  hypertension -BP currently stable  Code Status: Full CODE STATUS DVT Prophylaxis: Heparin subcu Family Communication: Discussed all imaging results, lab results, explained to the patient and patient's wife at the bedside   Disposition Plan:     Status is: Observation  The patient will require care spanning > 2 midnights and should be moved to inpatient because: Inpatient level of care appropriate due to severity of illness  Dispo: The patient is from: Home              Anticipated d/c is to: Home              Anticipated d/c date is: 2 days              Patient currently is not medically stable to d/c.      Time Spent in minutes   25 minutes  Procedures:  None  Consultants:   Nephrology, Dr. Royce Macadamia Cardiology, Dr. Marlou Porch  Antimicrobials:   Anti-infectives (From admission, onward)   None          Medications  Scheduled Meds: . amLODipine  10 mg Oral Daily  . aspirin  81  mg Oral Daily  . calcitRIOL  0.25 mcg Oral Once per day on Mon Wed Fri  . calcium carbonate  2 tablet Oral Daily  . carvedilol  12.5 mg Oral BID WC  . ferrous sulfate  325 mg Oral Q breakfast  . furosemide  80 mg Intravenous BID  . gabapentin  300 mg Oral BID  . heparin  5,000 Units Subcutaneous Q8H  . hydrALAZINE  50 mg Oral TID  . insulin aspart  0-15 Units Subcutaneous TID WC  . insulin aspart  0-5 Units Subcutaneous QHS  . isosorbide mononitrate  60 mg Oral QPM  . meclizine  25 mg Oral TID  . sodium chloride flush  3 mL Intravenous Q12H  . torsemide  20 mg Oral Daily   Continuous Infusions: . sodium chloride     PRN Meds:.sodium chloride, acetaminophen, lidocaine (PF), ondansetron (ZOFRAN) IV, senna-docusate, sodium chloride flush      Subjective:   Melvin Mcclure was seen and examined today.  Appears to have abdominal bloating, shortness of breath, no acute chest pain at this time.  Patient denies dizziness, new weakness, numbess, tingling. No acute events overnight.     Objective:   Vitals:   01/18/20 0700 01/18/20 0830 01/18/20 1115 01/18/20 1213  BP: 139/74 (!) 156/78 129/69   Pulse: 62 65 (!) 58   Resp:   17   Temp:    98 F (36.7 C)  TempSrc:    Oral  SpO2: 94% 92% 97%   Weight:      Height:       No intake or output data in the 24 hours ending 01/18/20 1236   Wt Readings from Last 3 Encounters:  01/17/20 90.7 kg  01/17/20 95.3 kg  12/28/19 85.3 kg     Exam  General: Alert and oriented x 3, NAD  Cardiovascular: S1 S2 auscultated, no murmurs, RRR, JVD +  Respiratory: Decreased breath sound at the bases with bibasilar Rales  Gastrointestinal: Soft, nontender, +distended, + bowel sounds  Ext: 1+ pedal edema bilaterally  Neuro: no new deficits  Musculoskeletal: No digital cyanosis, clubbing  Skin: No rashes  Psych: Normal affect and demeanor, alert and oriented x3    Data Reviewed:  I have personally reviewed following labs and imaging studies  Micro Results Recent Results (from the past 240 hour(s))  Resp Panel by RT PCR (RSV, Flu A&B, Covid) - Nasopharyngeal Swab     Status: None   Collection Time: 01/17/20  9:19 PM   Specimen: Nasopharyngeal Swab  Result Value Ref Range Status   SARS Coronavirus 2 by RT PCR NEGATIVE NEGATIVE Final    Comment: (NOTE) SARS-CoV-2 target nucleic acids are NOT DETECTED.  The SARS-CoV-2 RNA is generally detectable in upper respiratoy specimens during the acute phase of infection. The lowest concentration of SARS-CoV-2 viral copies this assay can detect is 131 copies/mL. A negative result does not preclude SARS-Cov-2 infection and should not be used as the sole basis for treatment or other patient management decisions. A negative result may occur with  improper specimen collection/handling, submission of specimen other than nasopharyngeal swab, presence of viral mutation(s) within the areas targeted by this assay, and inadequate number of viral copies (<131 copies/mL). A negative  result must be combined with clinical observations, patient history, and epidemiological information. The expected result is Negative.  Fact Sheet for Patients:  PinkCheek.be  Fact Sheet for Healthcare Providers:  GravelBags.it  This test is no t yet approved or cleared by the  [place] Architectural [employer]ologist and  has been authorized for detection and/or diagnosis of SARS-CoV-2 by FDA under an Print production planner (EUA). This EUA will remain  in effect (meaning this test can be used) for the duration of the COVID-19 declaration under Section 564(b)(1) of the Act, 21 U.S.C. section 360bbb-3(b)(1), unless the authorization is terminated or revoked sooner.     Influenza A by PCR NEGATIVE NEGATIVE Final   Influenza B by PCR NEGATIVE NEGATIVE Final    Comment: (NOTE) The Xpert Xpress SARS-CoV-2/FLU/RSV assay is intended as an aid in  the diagnosis of influenza from Nasopharyngeal swab specimens and  should not be used as a sole basis for treatment. Nasal washings and  aspirates are unacceptable for Xpert Xpress SARS-CoV-2/FLU/RSV  testing.  Fact Sheet for Patients: PinkCheek.be  Fact Sheet for Healthcare Providers: GravelBags.it  This test is not yet approved or cleared by the [place] FDA and  has been authorized for detection and/or diagnosis of SARS-CoV-2 by  FDA under an Emergency Use Authorization (EUA). This EUA will remain  in effect (meaning this test can be used) for the duration of the  Covid-19 declaration under Section 564(b)(1) of the Act, 21  U.S.C. section 360bbb-3(b)(1), unless the authorization is  terminated or revoked.    Respiratory Syncytial Virus by PCR NEGATIVE NEGATIVE Final    Comment: (NOTE) Fact Sheet for Patients: PinkCheek.be  Fact Sheet for Healthcare  Providers: GravelBags.it  This test is not yet approved or cleared by the [place] FDA and  has been authorized for detection and/or diagnosis of SARS-CoV-2 by  FDA under an Emergency Use Authorization (EUA). This EUA will remain  in effect (meaning this test can be used) for the duration of the  COVID-19 declaration under Section 564(b)(1) of the Act, 21 U.S.C.  section 360bbb-3(b)(1), unless the authorization is terminated or  revoked. Performed at [facility], [place] [address], [place],  [address]     Radiology Reports DG Chest 1 View  Result Date: 01/18/2020 CLINICAL DATA:  Status post thoracentesis EXAM: CHEST  1 VIEW COMPARISON:  January 17, 2020 FINDINGS: No pneumothorax. Right pleural effusion nearly completely resolved after thoracentesis. There is slight bibasilar atelectasis. Lungs elsewhere clear. Heart is mildly enlarged with pulmonary vascularity normal. No adenopathy. No bone lesions. IMPRESSION: No pneumothorax. Nearly complete resolution of right-sided pleural effusion. Bibasilar atelectasis. Stable cardiac prominence. Electronically Signed   By: Lowella Grip III M.D.   On: 01/18/2020 10:36   DG Chest 1 View  Result Date: 12/26/2019 CLINICAL DATA:  Status post right-sided thoracentesis EXAM: CHEST  1 VIEW COMPARISON:  12/23/2019 FINDINGS: Interval decrease in right-sided pleural effusion status post thoracentesis. Mild hazy right basilar opacity, significantly improved from prior. Left lung remains clear. No pneumothorax. Stable heart size. IMPRESSION: 1. Decreased right-sided pleural effusion status post thoracentesis. No pneumothorax. 2. Improved aeration of the right lung base. Electronically Signed   By: Davina Poke D.O.   On: 12/26/2019 16:14   DG Chest 2 View  Result Date: 01/17/2020 CLINICAL DATA:  Shortness of breath EXAM: CHEST - 2 VIEW COMPARISON:  December 26, 2019 FINDINGS: The heart size and mediastinal  contours are unchanged with mild cardiomegaly. A moderate right pleural effusion is seen. There is patchy airspace opacity at the right lung base which could be due to atelectasis or layering effusion. The left lung is clear. No acute osseous abnormality. IMPRESSION: Moderate right pleural effusion with adjacent patchy airspace opacity which could be atelectasis/layering effusion.  Electronically Signed   By: Prudencio Pair M.D.   On: 01/17/2020 17:13   DG Chest 2 View  Result Date: 12/23/2019 CLINICAL DATA:  Shortness of breath. EXAM: CHEST - 2 VIEW COMPARISON:  11/05/2019. FINDINGS: Cardiomegaly. Mild pulmonary venous congestion. Bilateral interstitial prominence suggesting CHF. Prominent right base atelectasis/infiltrate and moderate right pleural effusion. No pneumothorax. Degenerative change thoracic spine. IMPRESSION: 1. Cardiomegaly. Mild pulmonary venous congestion. Bilateral interstitial prominence suggesting CHF. 2. Prominent right base atelectasis/infiltrate and moderate right pleural effusion. Electronically Signed   By: Marcello Moores  Register   On: 12/23/2019 07:08   NM CARDIAC AMYLOID TUMOR LOC INFLAM SPECT 1 DAY  Result Date: 12/26/2019 CLINICAL DATA:  HEART FAILURE. CONCERN FOR CARDIAC AMYLOIDOSIS. EXAM: NUCLEAR MEDICINE TUMOR LOCALIZATION. PYP CARDIAC AMYLOIDOSIS SCAN WITH SPECT TECHNIQUE: Following intravenous administration of radiopharmaceutical, anterior planar images of the chest were obtained. Regions of interest were placed on the heart and contralateral chest wall for quantitative assessment. Additional SPECT imaging of the chest was obtained. RADIOPHARMACEUTICALS:  8 mCi TECHNETIUM 99 PYROPHOSPHATE FINDINGS: Planar Visual assessment: Anterior planar imaging demonstrates radiotracer uptake within the heart less than uptake within the adjacent ribs (Grade 1). Quantitative assessment : Quantitative assessment of the cardiac uptake compared to the contralateral chest wall is equal to 1.1 (H/CL =  1.1). SPECT assessment: SPECT imaging of the chest demonstrates minimal radiotracer accumulation within the LEFT ventricle. IMPRESSION: Visual and quantitative assessment (grade 1, H/CLL equal 1.1) are NOT suggestive of transthyretin amyloidosis. Electronically Signed   By: Suzy Bouchard M.D.   On: 12/26/2019 16:57   US RENAL  Addendum Date: 12/28/2019   ADDENDUM REPORT: 12/28/2019 11:47 ADDENDUM: Findings should read as follows: Right Kidney: Renal measurements: 9.2 x 4.6 x 5.2 cm = volume: 114 mL. Echogenic renal parenchyma with increased conspicuity between the cortex and medullary interface. No evidence of hydronephrosis or nephrolithiasis. No cystic or solid masses. Left Kidney: Renal measurements: 10.3 x 4.8 x 4.8 cm = volume: 123 mL. Echogenic renal parenchyma with increased conspicuity between the cortex and medullary interface. No evidence of hydronephrosis or nephrolithiasis. No cystic or solid masses. Electronically Signed   By: Jacqulynn Cadet M.D.   On: 12/28/2019 11:47   Result Date: 12/28/2019 CLINICAL DATA:  69 year old male with stage 4 chronic kidney disease EXAM: RENAL / URINARY TRACT ULTRASOUND COMPLETE COMPARISON:  None. FINDINGS: Right Kidney: Renal measurements: 9.2 x 4.6 x 5.2 cm = volume: 114 mL. Echogenic renal parenchyma with increased conspicuity between the cortex and medullary interface. No evidence of hydronephrosis or nephrolithiasis. No cystic or solid masses. Left Kidney: Renal measurements: 10.3 x 4.8 x 4.8 cm = volume: 123 mL. She denies any prior health problems or medication use. She started feeling short of breath about 3 days prior to admission. Associated with chest pressure in mid chest and nausea. Came to ER and CT angio showed PE. She was started on heparin gtt and supplemental oxygen. BP 176/104, HR 104, SpO2 96% on 2 liters oxygen during my examination of her. She reports improvement in dyspnea and chest pain. Denies syncopal episodes. Bladder: Appears normal  for degree of bladder distention. Good visualization of both ureteral jets. Other: Moderate right pleural effusion. IMPRESSION: 1. No evidence of hydronephrosis. 2. Echogenic renal parenchyma bilaterally consistent with medical renal disease. 3. Moderate right pleural effusion. Electronically Signed: By: Jacqulynn Cadet M.D. On: 12/25/2019 12:40   VAS US RENAL ARTERY DUPLEX  Result Date: 12/27/2019 ABDOMINAL VISCERAL Indications: Hypertension. High Risk Factors: Hyperlipidemia, Diabetes, coronary artery  disease. Other Factors: Stage IV chronic kidney disease. Comparison Study: No prior. Performing Technologist: Oda Cogan RDMS, RVT  Examination Guidelines: A complete evaluation includes B-mode imaging, spectral Doppler, color Doppler, and power Doppler as needed of all accessible portions of each vessel. Bilateral testing is considered an integral part of a complete examination. Limited examinations for reoccurring indications may be performed as noted.  Duplex Findings: +--------------------+--------+--------+------+--------+ Mesenteric          PSV cm/sEDV cm/sPlaqueComments +--------------------+--------+--------+------+--------+ Aorta Mid              92                          +--------------------+--------+--------+------+--------+ Celiac Artery Origin  269                          +--------------------+--------+--------+------+--------+ SMA Proximal          222                          +--------------------+--------+--------+------+--------+    +------------------+--------+--------+-------+ Right Renal ArteryPSV cm/sEDV cm/sComment +------------------+--------+--------+-------+ Origin              123      16           +------------------+--------+--------+-------+ Proximal            122      17           +------------------+--------+--------+-------+ Mid                  58      13           +------------------+--------+--------+-------+ Distal                59      12           +------------------+--------+--------+-------+ +-----------------+--------+--------+-------+ Left Renal ArteryPSV cm/sEDV cm/sComment +-----------------+--------+--------+-------+ Origin              69      10           +-----------------+--------+--------+-------+ Proximal            72      10           +-----------------+--------+--------+-------+ Mid                 46      9            +-----------------+--------+--------+-------+ Distal              42      10           +-----------------+--------+--------+-------+ +------------+--------+--------+----+-----------+--------+--------+----+ Right KidneyPSV cm/sEDV cm/sRI  Left KidneyPSV cm/sEDV cm/sRI   +------------+--------+--------+----+-----------+--------+--------+----+ Upper Pole  23      6       0.76Upper Pole 27      7       0.74 +------------+--------+--------+----+-----------+--------+--------+----+ Mid         29      6       0.81Mid        30      6       0.81 +------------+--------+--------+----+-----------+--------+--------+----+ Lower Pole  24                  Lower Pole 26      8       0.69 +------------+--------+--------+----+-----------+--------+--------+----+  Hilar       45      8       0.82Hilar      24      7       0.73 +------------+--------+--------+----+-----------+--------+--------+----+ +------------------+-----+------------------+-----+ Right Kidney           Left Kidney             +------------------+-----+------------------+-----+ RAR                    RAR                     +------------------+-----+------------------+-----+ RAR (manual)      1.3  RAR (manual)      0.78  +------------------+-----+------------------+-----+ Cortex                 Cortex                  +------------------+-----+------------------+-----+ Cortex thickness       Corex thickness          +------------------+-----+------------------+-----+ Kidney length (cm)11.40Kidney length (cm)10.07 +------------------+-----+------------------+-----+  Summary: Renal:  Right: No evidence of right renal artery stenosis. Abnormal right        Resistive Index. Left:  No evidence of left renal artery stenosis. Abnormal left        Resisitve Index.  *See table(s) above for measurements and observations.  Diagnosing physician: Harold Barban MD  Electronically signed by Harold Barban MD on 12/27/2019 at 5:40:10 PM.    Final    IR THORACENTESIS ASP PLEURAL SPACE W/IMG GUIDE  Result Date: 12/26/2019 INDICATION: Patient with history of CHF, now with right pleural effusion. Request is made for diagnostic and therapeutic thoracentesis. EXAM: ULTRASOUND GUIDED DIAGNOSTIC AND THERAPEUTIC RIGHT THORACENTESIS MEDICATIONS: 10 mL 1% lidocaine COMPLICATIONS: None immediate. PROCEDURE: An ultrasound guided thoracentesis was thoroughly discussed with the patient and questions answered. The benefits, risks, alternatives and complications were also discussed. The patient understands and wishes to proceed with the procedure. Written consent was obtained. Ultrasound was performed to localize and mark an adequate pocket of fluid in the right chest. The area was then prepped and draped in the normal sterile fashion. 1% Lidocaine was used for local anesthesia. Under ultrasound guidance a 6 Fr Safe-T-Centesis catheter was introduced. Thoracentesis was performed. The catheter was removed and a dressing applied. FINDINGS: A total of approximately 700 mL of amber fluid was removed. Samples were sent to the laboratory as requested by the clinical team. IMPRESSION: Successful ultrasound guided diagnostic and therapeutic right thoracentesis yielding 700 mL of pleural fluid. Read by: Brynda Greathouse PA-C No pneumothorax on follow-up radiograph. Electronically Signed   By: Lucrezia Europe M.D.   On: 12/26/2019 15:47    Lab Data:  CBC: Recent  Labs  Lab 01/17/20 1718  WBC 7.0  HGB 11.1*  HCT 37.1*  MCV 90.7  PLT 454   Basic Metabolic Panel: Recent Labs  Lab 01/17/20 1718 01/18/20 0430  NA 142 140  K 4.3 4.4  CL 109 107  CO2 23 25  GLUCOSE 156* 224*  BUN 65* 68*  CREATININE 3.64* 3.49*  CALCIUM 7.1* 6.8*   GFR: Estimated Creatinine Clearance: 22.3 mL/min (A) (by C-G formula based on SCr of 3.49 mg/dL (H)). Liver Function Tests: No results for input(s): AST, ALT, ALKPHOS, BILITOT, PROT, ALBUMIN in the last 168 hours. No results for input(s): LIPASE, AMYLASE in the last 168 hours. No results for input(s): AMMONIA in the last  168 hours. Coagulation Profile: No results for input(s): INR, PROTIME in the last 168 hours. Cardiac Enzymes: No results for input(s): CKTOTAL, CKMB, CKMBINDEX, TROPONINI in the last 168 hours. BNP (last 3 results) No results for input(s): PROBNP in the last 8760 hours. HbA1C: No results for input(s): HGBA1C in the last 72 hours. CBG: Recent Labs  Lab 01/17/20 2118 01/18/20 0822 01/18/20 1211  GLUCAP 173* 125* 67*   Lipid Profile: No results for input(s): CHOL, HDL, LDLCALC, TRIG, CHOLHDL, LDLDIRECT in the last 72 hours. Thyroid Function Tests: No results for input(s): TSH, T4TOTAL, FREET4, T3FREE, THYROIDAB in the last 72 hours. Anemia Panel: No results for input(s): VITAMINB12, FOLATE, FERRITIN, TIBC, IRON, RETICCTPCT in the last 72 hours. Urine analysis:    Component Value Date/Time   COLORURINE YELLOW 12/28/2019 1850   APPEARANCEUR CLEAR 12/28/2019 1850   LABSPEC 1.014 12/28/2019 1850   PHURINE 5.0 12/28/2019 1850   GLUCOSEU NEGATIVE 12/28/2019 1850   HGBUR NEGATIVE 12/28/2019 1850   BILIRUBINUR NEGATIVE 12/28/2019 1850   KETONESUR NEGATIVE 12/28/2019 1850   PROTEINUR 30 (A) 12/28/2019 1850   UROBILINOGEN 0.2 06/17/2010 1620   NITRITE NEGATIVE 12/28/2019 1850   LEUKOCYTESUR NEGATIVE 12/28/2019 1850     Carmichael Burdette M.D. [facility] 01/18/2020, 12:36  PM   Call night coverage person covering after 7pm

## 2020-01-18 NOTE — Consult Note (Addendum)
Cardiology Consultation:   Patient ID: Melvin Mcclure; [id number]; 07-06-1950   Admit date: 01/17/2020 Date of Consult: 01/18/2020  Primary Care Provider: Nolene Ebbs, MD Primary Cardiologist: Kirk Ruths, MD 06/04/2018 in-hospital Primary Electrophysiologist:  None   Patient Profile:   Melvin Mcclure is a 69 y.o. male with a hx of CHF, DM, HTN, HLD, anemia, CKD, D-CHF 08/021, negative amyloid scan, who is being seen today for the evaluation of CHF at the request of Dr Tana Coast.  History of Present Illness:   Mr. Carmer was admitted in July and in August for CHF.  He was followed by cardiology while he was there.    Dr. Harrell Gave reviewed his echo and felt that his RA pressure was 3, RV size and function normal, TR too low to determine RVSP, E/e' less than 15 suggesting that LVEDP not severely elevated.  There is concerned that his volume overload may be mostly due to CKD and some diastolic heart failure. During 12/2019 hospital stay, he got 700 cc out by thoracentesis (transudative).   He was also having chest wall tenderness, but troponins were negative and EF was normal so no ischemic eval indicated.  Not on home O2 at discharge.  DC 12/31/2019  After discharge, he followed up with his nephrologist, Dr. Royce Macadamia.  Diuretics were initially held, but torsemide restarted at 40 mg daily, later decreased to 20 mg daily.  He came to the office 9/14 with worsening shortness of breath and hypoxia.  O2 saturation was 80% on room air.  His weight was up some, the patient thinks it is up about 4 pounds.  He does not put on fluid in his legs, but says he has been getting more short of breath, and his abdomen is also full of fluid.  He cannot lie down.  He cannot go to the mailbox or do anything significant without getting short of breath.  He cannot sleep because of the shortness of breath.  In the ER, he had pleural effusions and once again had thoracentesis of 1.75 L.  After the procedure, he  is breathing a little bit better, but still short of breath on oxygen with any exertion, even conversation.   Past Medical History:  Diagnosis Date  . Anemia in chronic kidney disease (CKD)   . CHF (congestive heart failure) ([place]) 05/28/2018   dx 05/28/18  . Diabetes mellitus without complication ([place])   . Hyperlipidemia   . Hypertension   . Iron deficiency anemia   . Perforating neurotrophic ulcer of foot ([place])     Past Surgical History:  Procedure Laterality Date  . ACHILLES TENDON REPAIR    . CYST EXCISION    . IR THORACENTESIS ASP PLEURAL SPACE W/IMG GUIDE  12/26/2019     Prior to Admission medications   Medication Sig Start Date End Date Taking? Authorizing Provider  amLODipine (NORVASC) 10 MG tablet Take 10 mg by mouth daily.   Yes [provider]  aspirin 81 MG tablet Take 81 mg by mouth daily.   Yes [provider]  calcitRIOL (ROCALTROL) 0.25 MCG capsule Take 0.25 mcg by mouth 3 (three) times a week. Monday, Wednesday, Friday 10/18/19  Yes [provider]  calcium carbonate (TUMS - DOSED IN MG ELEMENTAL CALCIUM) 500 MG chewable tablet Chew 2 tablets by mouth daily.    Yes [provider]  carvedilol (COREG) 12.5 MG tablet Take 1 tablet (12.5 mg total) by mouth 2 (two) times daily with a meal. 12/31/19  Yes Eulogio Bear  U, DO  diclofenac Sodium (VOLTAREN) 1 % GEL Apply 2 g topically 4 (four) times daily. Patient taking differently: Apply 2 g topically 4 (four) times daily as needed (pain).  12/31/19  Yes Vann, Jessica U, DO  ferrous sulfate 325 (65 FE) MG tablet Take 325 mg by mouth daily with breakfast.   Yes [provider]  gabapentin (NEURONTIN) 300 MG capsule Take 1 capsule (300 mg total) by mouth 2 (two) times daily. 12/31/19  Yes Eulogio Bear U, DO  hydrALAZINE (APRESOLINE) 50 MG tablet Take 1 tablet (50 mg total) by mouth every 8 (eight) hours. Patient taking differently: Take 50 mg by mouth 3 (three) times daily.  06/04/18   Yes Dessa Phi, DO  isosorbide mononitrate (IMDUR) 60 MG 24 hr tablet Take 1 tablet (60 mg total) by mouth every evening. 12/31/19  Yes Vann, Jessica U, DO  meclizine (ANTIVERT) 25 MG tablet Take 1 tablet (25 mg total) by mouth 3 (three) times daily. 12/31/19  Yes Vann, Jessica U, DO  senna-docusate (SENOKOT-S) 8.6-50 MG tablet Take 1 tablet by mouth 2 (two) times daily as needed for mild constipation.    Yes [provider]  torsemide (DEMADEX) 20 MG tablet Take 20 mg by mouth daily. 01/01/20  Yes [provider]    Inpatient Medications: Scheduled Meds: . amLODipine  10 mg Oral Daily  . aspirin  81 mg Oral Daily  . calcitRIOL  0.25 mcg Oral Once per day on Mon Wed Fri  . calcium carbonate  2 tablet Oral Daily  . carvedilol  12.5 mg Oral BID WC  . ferrous sulfate  325 mg Oral Q breakfast  . gabapentin  300 mg Oral BID  . heparin  5,000 Units Subcutaneous Q8H  . hydrALAZINE  50 mg Oral TID  . insulin aspart  0-15 Units Subcutaneous TID WC  . insulin aspart  0-5 Units Subcutaneous QHS  . isosorbide mononitrate  60 mg Oral QPM  . lidocaine      . meclizine  25 mg Oral TID  . sodium chloride flush  3 mL Intravenous Q12H  . torsemide  20 mg Oral Daily   Continuous Infusions: . sodium chloride     PRN Meds: sodium chloride, acetaminophen, lidocaine (PF), ondansetron (ZOFRAN) IV, senna-docusate, sodium chloride flush  Allergies:   No Known Allergies  Social History:   Social History   Socioeconomic History  . Marital status: Married    Spouse name: Not on file  . Number of children: Not on file  . Years of education: Not on file  . Highest education level: Not on file  Occupational History  . Not on file  Tobacco Use  . Smoking status: Never Smoker  . Smokeless tobacco: Never Used  Vaping Use  . Vaping Use: Never used  Substance and Sexual Activity  . Alcohol use: No    Alcohol/week: 0.0 standard drinks  . Drug use: No  . Sexual activity: Not on file   Other Topics Concern  . Not on file  Social History Narrative  . Not on file   Social Determinants of Health   Financial Resource Strain:   . Difficulty of Paying Living Expenses: Not on file  Food Insecurity:   . Worried About Charity fundraiser in the Last Year: Not on file  . Ran Out of Food in the Last Year: Not on file  Transportation Needs:   . Lack of Transportation (Medical): Not on file  . Lack of  Transportation (Non-Medical): Not on file  Physical Activity:   . Days of Exercise per Week: Not on file  . Minutes of Exercise per Session: Not on file  Stress:   . Feeling of Stress : Not on file  Social Connections:   . Frequency of Communication with Friends and Family: Not on file  . Frequency of Social Gatherings with Friends and Family: Not on file  . Attends Religious Services: Not on file  . Active Member of Clubs or Organizations: Not on file  . Attends Archivist Meetings: Not on file  . Marital Status: Not on file  Intimate Partner Violence:   . Fear of Current or Ex-Partner: Not on file  . Emotionally Abused: Not on file  . Physically Abused: Not on file  . Sexually Abused: Not on file    Family History:   Family History  Problem Relation Age of Onset  . Diabetes Mellitus II Mother   . Hypertension Father    Family Status:  Family Status  Relation Name Status  . Mother  Deceased  . Father  Alive    ROS:  Please see the history of present illness.  All other ROS reviewed and negative.     Physical Exam/Data:   Vitals:   01/18/20 0615 01/18/20 0645 01/18/20 0700 01/18/20 0830  BP: (!) 142/79 (!) 141/74 139/74 (!) 156/78  Pulse: 63 64 62 65  Resp: 18     Temp:      TempSrc:      SpO2: 92% 92% 94% 92%  Weight:      Height:       No intake or output data in the 24 hours ending 01/18/20 1057  Last 3 Weights 01/17/2020 01/17/2020 12/28/2019  Weight (lbs) 200 lb 210 lb 188 lb 1.6 oz  Weight (kg) 90.719 kg 95.255 kg 85.322 kg       Body mass index is 28.7 kg/m.   General:  Well nourished, well developed, male in no acute distress HEENT: normal Lymph: no adenopathy Neck: JVD -11 cm Endocrine:  No thryomegaly Vascular: No carotid bruits; 4/4 extremity pulses 2+  Cardiac:  normal S1, S2; RRR; no murmur Lungs: Rales bases bilaterally, no wheezing, rhonchi Abd: Distended, firm, nontender, no hepatomegaly  Ext: no edema Musculoskeletal:  No deformities, BUE and BLE strength normal and equal Skin: warm and dry  Neuro:  CNs 2-12 intact, no focal abnormalities noted Psych:  Normal affect   EKG:  The EKG was personally reviewed and demonstrates: Sinus rhythm, heart rate 60, RBBB, LAFB are old, no change from 12/25/2019 Telemetry:  Telemetry was personally reviewed and demonstrates: Sinus rhythm   CV studies:   ECHO: 10/29/2019 1. Left ventricular ejection fraction, by estimation, is 55 to 60%. The  left ventricle has normal function. The left ventricle has no regional  wall motion abnormalities. There is moderate left ventricular hypertrophy.  Left ventricular diastolic  parameters are indeterminate.  2. Right ventricular systolic function is normal. The right ventricular  size is normal. Tricuspid regurgitation signal is inadequate for assessing  PA pressure.  3. The mitral valve is grossly normal. Mild mitral valve regurgitation.  4. The aortic valve is tricuspid, mildly calcified. Aortic valve  regurgitation is not visualized.  5. The inferior vena cava is normal in size with greater than 50%  respiratory variability, suggesting right atrial pressure of 3 mmHg.  6. Trivial to small circumferential pericardial effusion.   RENAL DOPPLERS: 12/27/2019 Right: No evidence of right  renal artery stenosis. Abnormal right     Resistive Index.  Left: No evidence of left renal artery stenosis. Abnormal left     Resisitve Index.    Laboratory Data:   Chemistry Recent Labs  Lab 01/17/20 1718  01/18/20 0430  NA 142 140  K 4.3 4.4  CL 109 107  CO2 23 25  GLUCOSE 156* 224*  BUN 65* 68*  CREATININE 3.64* 3.49*  CALCIUM 7.1* 6.8*  GFRNONAA 16* 17*  GFRAA 18* 19*  ANIONGAP 10 8    Lab Results  Component Value Date   ALT 17 12/28/2019   AST 23 12/28/2019   ALKPHOS 80 12/28/2019   BILITOT 0.6 12/28/2019   Hematology Recent Labs  Lab 01/17/20 1718  WBC 7.0  RBC 4.09*  HGB 11.1*  HCT 37.1*  MCV 90.7  MCH 27.1  MCHC 29.9*  RDW 16.9*  PLT 271   Cardiac Enzymes High Sensitivity Troponin:   Recent Labs  Lab 12/23/19 1059 12/25/19 0456 12/25/19 0739 01/17/20 1718 01/17/20 1935  TROPONINIHS 11 13 12 10 11       BNPNo results for input(s): BNP, PROBNP in the last 168 hours.  DDimer No results for input(s): DDIMER in the last 168 hours. TSH:  Lab Results  Component Value Date   TSH 2.399 05/28/2018   Lipids:No results found for: CHOL, HDL, LDLCALC, LDLDIRECT, TRIG, CHOLHDL HgbA1c: Lab Results  Component Value Date   HGBA1C 6.6 (H) 12/29/2019   Magnesium:  Magnesium  Date Value Ref Range Status  05/28/2018 2.3 1.7 - 2.4 mg/dL Final    Comment:    Performed at [facility], [place] [address], [place], [place] [address]     Radiology/Studies:  DG Chest 1 View  Result Date: 01/18/2020 CLINICAL DATA:  Status post thoracentesis EXAM: CHEST  1 VIEW COMPARISON:  January 17, 2020 FINDINGS: No pneumothorax. Right pleural effusion nearly completely resolved after thoracentesis. There is slight bibasilar atelectasis. Lungs elsewhere clear. Heart is mildly enlarged with pulmonary vascularity normal. No adenopathy. No bone lesions. IMPRESSION: No pneumothorax. Nearly complete resolution of right-sided pleural effusion. Bibasilar atelectasis. Stable cardiac prominence. Electronically Signed   By: Lowella Grip III M.D.   On: 01/18/2020 10:36   DG Chest 2 View  Result Date: 01/17/2020 CLINICAL DATA:  Shortness of breath EXAM: CHEST - 2 VIEW COMPARISON:   December 26, 2019 FINDINGS: The heart size and mediastinal contours are unchanged with mild cardiomegaly. A moderate right pleural effusion is seen. There is patchy airspace opacity at the right lung base which could be due to atelectasis or layering effusion. The left lung is clear. No acute osseous abnormality. IMPRESSION: Moderate right pleural effusion with adjacent patchy airspace opacity which could be atelectasis/layering effusion. Electronically Signed   By: Prudencio Pair M.D.   On: 01/17/2020 17:13    Assessment and Plan:   1.  Acute on chronic diastolic CHF -Consider nephrology consult to help manage diuresis -He is currently on his home dose of torsemide 20 mg daily. -Abdominal exam is concerning for ascites, will get abdominal ultrasound -S/p removal of 1.75 L by thoracentesis per IR -Patient does not understand why he continues to accumulate fluid, this is his third admission in 3 months  2.  Hypertension: -On his home blood pressure medications of amlodipine 10 mg daily, Coreg 12.5 mg twice daily, hydralazine 50 mg daily to 60 mg a day and Demadex -Blood pressure is above target, but does not seem high enough to cause his  CHF exacerbation.  3.  CKD IV -4.34 at discharge on 8/28, followed by Nephrology. -Diuretics were held and then restarted by nephrology. -Although creatinine has been in the high twos for a while, he did not go over 3 until his August admission.  Otherwise, per IM Principal Problem:   Acute respiratory failure with hypoxia ([place]) Active Problems:   Acute diastolic CHF (congestive heart failure) ([facility])   DM2 (diabetes mellitus, type 2) ([place])   CKD stage 4 due to type 2 diabetes mellitus ([place])   Hypertensive cardiomyopathy, with heart failure ([facility])   Recurrent right pleural effusion   HTN (hypertension)     For questions or updates, please contact [facility] Please consult [email or web address] for contact info under Cardiology/STEMI.   Signed, Rosaria Ferries, PA-C  01/18/2020 10:57 AM  Personally seen and examined. Agree with above.   Still appears fluid overloaded.  In bed, tired.  Noticeable increased work of breathing.  Satting well.  Wheeze heard at end expiration.  1.5 L taken off thoracentesis.  Dr. Royce Macadamia with nephrology sees him as an outpatient.  His wife, RN 12 years.  Understands tricky balance with acute kidney injury.  I think overall, he needs more diuretic.  I will go ahead and write for Lasix 80 mg IV twice daily.  Carefully monitor his basic metabolic profile.  I think would be helpful to have nephrology on board as well.  Challenging case with reaccumulation with diastolic heart failure and underlying chronic kidney disease.  Candee Furbish, MD

## 2020-01-18 NOTE — Procedures (Signed)
PROCEDURE SUMMARY:  Successful image-guided right thoracentesis. Yielded 1.75 liters of hazy amber fluid. Patient tolerated procedure well. No immediate complications. EBL = 0 mL.  Specimen was not sent for labs. CXR ordered.  Please see imaging section of Epic for full dictation.   Claris Pong Hailynn Slovacek PA-C 01/18/2020 10:27 AM

## 2020-01-18 NOTE — ED Notes (Signed)
Off floor to IR.

## 2020-01-18 NOTE — ED Notes (Signed)
Back from IR

## 2020-01-18 NOTE — ED Notes (Signed)
Off floor to ultrasound.

## 2020-01-18 NOTE — Consult Note (Signed)
[facility] Renal Consultation Note  Requesting MD: Estill Cotta MD Indication for Consultation:  CKD   Chief complaint: shortness of breath  HPI:  Melvin Mcclure is a 69 y.o. male with a history of diabetes, hypertension, CKD stage III/IV, and chronic diastolic CHF who presented to the hospital with shortness of breath.  Feels like he carries his fluid in his lungs and his abdomen - his belly feels tight.  He was found to be hypoxic - sats 80's at recent appt.  He had a thoracentesis today.  He and his wife are worried about his kidney function.  Note that he was recently hospitalized with hypoxia and acute on chronic CHF.  He was aggressively diuresed and ultimately had AKI that admission.  Nephrology was consulted at that time and creatinine was 2.91 on admission and peak of 4.34 on 8/28 (the date of discharge).  At that time per charting he advocated to be able to go home that day and his diuretics were held on discharge.  I saw him later that week on 9/2 and restarted his torsemide initially at 40 mg daily then decreased to 20 mg daily - his prior regimen.  Note that his wife coordinates his care and medications.  He is conversant but is not well versed in his medications.  He required a thoracentesis last admission.  Recent admission with pneumonia this summer as well.  Other imaging includes nuclear medicine scan not consistent with amyloid.  His more distant history is notable for evaluation in the ER for AMS in 03/2017 where he was found to be cocaine positive.   We have discussed his kidney failure and high risk of progression at his past appointments.  I again discussed with him today about the risk of progression and anticipated eventual need for dialysis and he states that he would want dialysis.  He is also interested in pursuing a transplant and with his recent decline in kidney function a referral is appropriate once more clinically stable though age may be limiting factor  at many centers if he does not have a living donor.  We discussed this.  Recent labs: 01/05/20 - Cr 3.67, BUN 85, eGFR 18, Hb 10.6 01/03/20 - Cr 3.88, BUN 88, eGFR 17, K 4.5, bicarb 26, calcium 7.3, alb 3.9, phos 5.1 12/31/19 - Cr 4.34, BUN 79 (on discharge); Hb 11.2 on 12/23/19; A1c 6.6 this admit 12/05/19 - Cr 2.46, BUN 41, eGFR 30, K 4.5, bicarb 21, Ca 7.6, alb 3.9, phos 4.9, Hb 10.8, PTH 101; up/cr ratio 1766 mg/g 10/28/19 - 11/06/19 hospitalization. On 10/28/19 Cr 1.76 on admit to [facility]; Cr appears 2.7 - 2.9 on labs provided to me and was Cr 2.94 on discharge. Hb 10.1 on 11/05/19   Creatinine, Ser  Date/Time Value Ref Range Status  01/18/2020 04:30 AM 3.49 (H) 0.61 - 1.24 mg/dL Final  01/17/2020 05:18 PM 3.64 (H) 0.61 - 1.24 mg/dL Final  12/31/2019 02:22 AM 4.34 (H) 0.61 - 1.24 mg/dL Final  12/30/2019 06:29 AM 3.99 (H) 0.61 - 1.24 mg/dL Final  12/29/2019 12:44 AM 3.85 (H) 0.61 - 1.24 mg/dL Final  12/28/2019 03:08 PM 3.89 (H) 0.61 - 1.24 mg/dL Final  12/28/2019 09:32 AM 3.83 (H) 0.61 - 1.24 mg/dL Final  12/27/2019 05:39 AM 3.47 (H) 0.61 - 1.24 mg/dL Final  12/26/2019 08:29 AM 3.16 (H) 0.61 - 1.24 mg/dL Final  12/25/2019 04:56 AM 2.92 (H) 0.61 - 1.24 mg/dL Final  12/24/2019 03:20 AM 2.91 (H) 0.61 - 1.24  mg/dL Final  12/23/2019 06:36 AM 2.96 (H) 0.61 - 1.24 mg/dL Final  11/06/2019 05:39 AM 2.94 (H) 0.61 - 1.24 mg/dL Final  11/05/2019 12:33 PM 2.95 (H) 0.61 - 1.24 mg/dL Final  11/04/2019 04:50 AM 2.83 (H) 0.61 - 1.24 mg/dL Final  11/03/2019 05:31 AM 2.83 (H) 0.61 - 1.24 mg/dL Final  11/02/2019 05:38 AM 2.98 (H) 0.61 - 1.24 mg/dL Final  11/01/2019 05:19 AM 2.89 (H) 0.61 - 1.24 mg/dL Final  10/31/2019 07:51 AM 2.71 (H) 0.61 - 1.24 mg/dL Final  10/29/2019 07:58 AM 2.97 (H) 0.61 - 1.24 mg/dL Final  10/28/2019 03:27 PM 2.76 (H) 0.61 - 1.24 mg/dL Final  12/15/2018 06:10 PM 2.28 (H) 0.61 - 1.24 mg/dL Final  06/15/2018 10:50 AM 2.14 (H) 0.76 - 1.27 mg/dL Final  06/04/2018 05:51 AM 2.53 (H) 0.61 -  1.24 mg/dL Final  06/03/2018 05:02 AM 2.88 (H) 0.61 - 1.24 mg/dL Final  06/02/2018 07:45 AM 2.72 (H) 0.61 - 1.24 mg/dL Final  06/01/2018 04:11 AM 2.73 (H) 0.61 - 1.24 mg/dL Final  05/31/2018 04:47 AM 2.53 (H) 0.61 - 1.24 mg/dL Final  05/30/2018 05:33 AM 2.56 (H) 0.61 - 1.24 mg/dL Final  05/29/2018 04:39 AM 2.46 (H) 0.61 - 1.24 mg/dL Final  05/28/2018 02:11 PM 2.29 (H) 0.61 - 1.24 mg/dL Final  05/20/2018 02:57 PM 2.22 (H) 0.61 - 1.24 mg/dL Final  03/07/2017 01:10 PM 1.50 (H) 0.61 - 1.24 mg/dL Final  03/07/2017 12:56 PM 1.62 (H) 0.61 - 1.24 mg/dL Final  06/17/2010 04:52 PM 1.5 0.40 - 1.50 mg/dL Final  03/04/2010 10:50 AM 1.74 (H) 0.40 - 1.50 mg/dL Final     PMHx:   Past Medical History:  Diagnosis Date   Anemia in chronic kidney disease (CKD)    CHF (congestive heart failure) ([place]) 05/28/2018   dx 05/28/18   Diabetes mellitus without complication (HCC)    Hyperlipidemia    Hypertension    Iron deficiency anemia    Perforating neurotrophic ulcer of foot ([place])     Past Surgical History:  Procedure Laterality Date   ACHILLES TENDON REPAIR     CYST EXCISION     IR THORACENTESIS ASP PLEURAL SPACE W/IMG GUIDE  12/26/2019    Family Hx:  Family History  Problem Relation Age of Onset   Diabetes Mellitus II Mother    Hypertension Father     Social History:  reports that he has never smoked. He has never used smokeless tobacco. He reports that he does not drink alcohol and does not use drugs.  Allergies: No Known Allergies  Medications: Prior to Admission medications   Medication Sig Start Date End Date Taking? Authorizing Provider  amLODipine (NORVASC) 10 MG tablet Take 10 mg by mouth daily.   Yes [provider]  aspirin 81 MG tablet Take 81 mg by mouth daily.   Yes [provider]  calcitRIOL (ROCALTROL) 0.25 MCG capsule Take 0.25 mcg by mouth 3 (three) times a week. Monday, Wednesday, Friday 10/18/19  Yes [provider]  calcium  carbonate (TUMS - DOSED IN MG ELEMENTAL CALCIUM) 500 MG chewable tablet Chew 2 tablets by mouth daily.    Yes [provider]  carvedilol (COREG) 12.5 MG tablet Take 1 tablet (12.5 mg total) by mouth 2 (two) times daily with a meal. 12/31/19  Yes Vann, Jessica U, DO  diclofenac Sodium (VOLTAREN) 1 % GEL Apply 2 g topically 4 (four) times daily. Patient taking differently: Apply 2 g topically 4 (four) times daily as needed (pain).  12/31/19  Yes Vann, Jessica U, DO  ferrous sulfate 325 (65 FE) MG tablet Take 325 mg by mouth daily with breakfast.   Yes [provider]  gabapentin (NEURONTIN) 300 MG capsule Take 1 capsule (300 mg total) by mouth 2 (two) times daily. 12/31/19  Yes Eulogio Bear U, DO  hydrALAZINE (APRESOLINE) 50 MG tablet Take 1 tablet (50 mg total) by mouth every 8 (eight) hours. Patient taking differently: Take 50 mg by mouth 3 (three) times daily.  06/04/18  Yes Dessa Phi, DO  isosorbide mononitrate (IMDUR) 60 MG 24 hr tablet Take 1 tablet (60 mg total) by mouth every evening. 12/31/19  Yes Vann, Jessica U, DO  meclizine (ANTIVERT) 25 MG tablet Take 1 tablet (25 mg total) by mouth 3 (three) times daily. 12/31/19  Yes Vann, Jessica U, DO  senna-docusate (SENOKOT-S) 8.6-50 MG tablet Take 1 tablet by mouth 2 (two) times daily as needed for mild constipation.    Yes [provider]  torsemide (DEMADEX) 20 MG tablet Take 20 mg by mouth daily. 01/01/20  Yes [provider]    I have reviewed the patient's current and reported prior to admission medications.  Labs:  BMP Latest Ref Rng & Units 01/18/2020 01/17/2020 12/31/2019  Glucose 70 - 99 mg/dL 224(H) 156(H) 256(H)  BUN 8 - 23 mg/dL 68(H) 65(H) 79(H)  Creatinine 0.61 - 1.24 mg/dL 3.49(H) 3.64(H) 4.34(H)  BUN/Creat Ratio 10 - 24 - - -  Sodium 135 - 145 mmol/L 140 142 133(L)  Potassium 3.5 - 5.1 mmol/L 4.4 4.3 4.1  Chloride 98 - 111 mmol/L 107 109 98  CO2 22 - 32 mmol/L _0 Calcium 8.9 - 10.3  mg/dL 6.8(L) 7.1(L) 7.1(L)    Urinalysis    Component Value Date/Time   COLORURINE YELLOW 12/28/2019 1850   APPEARANCEUR CLEAR 12/28/2019 1850   LABSPEC 1.014 12/28/2019 1850   PHURINE 5.0 12/28/2019 1850   GLUCOSEU NEGATIVE 12/28/2019 1850   HGBUR NEGATIVE 12/28/2019 1850   BILIRUBINUR NEGATIVE 12/28/2019 1850   KETONESUR NEGATIVE 12/28/2019 1850   PROTEINUR 30 (A) 12/28/2019 1850   UROBILINOGEN 0.2 06/17/2010 1620   NITRITE NEGATIVE 12/28/2019 1850   LEUKOCYTESUR NEGATIVE 12/28/2019 1850     ROS:  Pertinent items noted in HPI and remainder of comprehensive ROS otherwise negative.  Physical Exam: Vitals:   01/18/20 1300 01/18/20 1421  BP: 122/69 132/71  Pulse: (!) 59 62  Resp: 14 19  Temp:  98 F (36.7 C)  SpO2: 92% 92%     General: Adult seated in no acute distress at rest HEENT: Normocephalic atraumatic Eyes: Extraocular movements intact sclera anicteric Neck: supple trachea midline Heart: S1S2 no rub RRR Lungs: crackles left base and reduced breath sounds right base; on supp oxygen and unlabored at rest Abdomen: distended and nontender Extremities: he has no edema; no cyanosis or clubbing Skin:  No rash on extremities exposed Neuro: conversant and follows commands; alert and oriented x 3  Assessment/Plan:   # AKI - Secondary to pre-renal insults with diuresis in setting of acute hypoxic resp failure due to both acute CHF and recovering from PNA - This may also simply represent progression of his CKD    # Acute hypoxic resp failure  - s/p thoracentesis  - diruesis as below   # Acute on Chronic diastolic CHF - lasix 80 mg IV BID for now  - Metolazone 5 mg once now - We will assess for when appropriate to attempt transition to PO  and torsemide 40 mg BID with metolazone PRN may be a good option - strict ins and outs   # CKD stage IV - Secondary to diabetic nephropathy as well as microvascular disease from HTN. Note also ongoing pre-renal insults with  some prior NSAID use. Serologic work-up negative to date and no signs of plasma cell dyscrasia.  His insight into his kidney disease is impaired despite multiple prior discussions - with likely recent progression with multiple AKI events  - Does want HD when he progresses to that point and we have discussed high risk of progression at past visits  # HTN with CKD - Continue current regimen with diuretics as above  # Secondary hyperparathyroidism and metabolic bone disease - calcitriol 0.25 mcg three times a week  - Have recommended tums two 500 mg tablets nightly   # Anemia secondary to CKD - no acute indication for PRBC's  Claudia Desanctis 01/18/2020, 5:34 PM

## 2020-01-19 ENCOUNTER — Ambulatory Visit: Payer: Medicare Other | Admitting: General Practice

## 2020-01-19 LAB — BASIC METABOLIC PANEL
Anion gap: 14 (ref 5–15)
BUN: 64 mg/dL — ABNORMAL HIGH (ref 8–23)
CO2: 22 mmol/L (ref 22–32)
Calcium: 7.3 mg/dL — ABNORMAL LOW (ref 8.9–10.3)
Chloride: 105 mmol/L (ref 98–111)
Creatinine, Ser: 3.6 mg/dL — ABNORMAL HIGH (ref 0.61–1.24)
GFR calc Af Amer: 19 mL/min — ABNORMAL LOW (ref >60)
GFR calc non Af Amer: 16 mL/min — ABNORMAL LOW (ref >60)
Glucose, Bld: 158 mg/dL — ABNORMAL HIGH (ref 70–99)
Potassium: 4.2 mmol/L (ref 3.5–5.1)
Sodium: 141 mmol/L (ref 135–145)

## 2020-01-19 LAB — GLUCOSE, CAPILLARY
Glucose-Capillary: 109 mg/dL — ABNORMAL HIGH (ref 70–99)
Glucose-Capillary: 135 mg/dL — ABNORMAL HIGH (ref 70–99)
Glucose-Capillary: 145 mg/dL — ABNORMAL HIGH (ref 70–99)
Glucose-Capillary: 154 mg/dL — ABNORMAL HIGH (ref 70–99)

## 2020-01-19 MED ORDER — CALCIUM GLUCONATE-NACL 1-0.675 GM/50ML-% IV SOLN
1.0000 g | Freq: Once | INTRAVENOUS | Status: AC
  Administered 2020-01-19: 1000 mg via INTRAVENOUS
  Filled 2020-01-19: qty 50

## 2020-01-19 MED ORDER — IPRATROPIUM-ALBUTEROL 0.5-2.5 (3) MG/3ML IN SOLN
3.0000 mL | Freq: Four times a day (QID) | RESPIRATORY_TRACT | Status: DC
  Administered 2020-01-19: 3 mL via RESPIRATORY_TRACT
  Filled 2020-01-19: qty 3

## 2020-01-19 MED ORDER — ALBUTEROL SULFATE (2.5 MG/3ML) 0.083% IN NEBU
2.5000 mg | INHALATION_SOLUTION | RESPIRATORY_TRACT | Status: DC | PRN
  Administered 2020-01-19: 2.5 mg via RESPIRATORY_TRACT
  Filled 2020-01-19: qty 3

## 2020-01-19 MED ORDER — IPRATROPIUM-ALBUTEROL 0.5-2.5 (3) MG/3ML IN SOLN
3.0000 mL | Freq: Three times a day (TID) | RESPIRATORY_TRACT | Status: DC
  Administered 2020-01-19 – 2020-01-21 (×5): 3 mL via RESPIRATORY_TRACT
  Filled 2020-01-19 (×5): qty 3

## 2020-01-19 NOTE — Progress Notes (Signed)
[facility] Progress Note  Name: Melvin Mcclure MRN: [id number] DOB: 1951/03/11  Chief Complaint:  Shortness of breath   Subjective:  He had 2.5 liters UOP over 9/15 charted.  Got metolazone x 1 dose on 9/15.  He feels better today.  Spoke with the patient and his wife for 30 minutes at bedside.   Review of systems:  Short of breath but better  Denies n/v Denies chest pain  --------- Background on consult:  Melvin Mcclure is a 69 y.o. male with a history of diabetes, hypertension, CKD stage III/IV, and chronic diastolic CHF who presented to the hospital with shortness of breath.  Feels like he carries his fluid in his lungs and his abdomen - his belly feels tight.  He was found to be hypoxic - sats 80's at recent appt.  He had a thoracentesis today.  He and his wife are worried about his kidney function.  Note that he was recently hospitalized with hypoxia and acute on chronic CHF.  He was aggressively diuresed and ultimately had AKI that admission.  Nephrology was consulted at that time and creatinine was 2.91 on admission and peak of 4.34 on 8/28 (the date of discharge).  At that time per charting he advocated to be able to go home that day and his diuretics were held on discharge.  I saw him later that week on 9/2 and restarted his torsemide initially at 40 mg daily then decreased to 20 mg daily - his prior regimen.  Note that his wife coordinates his care and medications.  He is conversant but is not well versed in his medications.  He required a thoracentesis last admission.  Recent admission with pneumonia this summer as well.  Other imaging includes nuclear medicine scan not consistent with amyloid.  His more distant history is notable for evaluation in the ER for AMS in 03/2017 where he was found to be cocaine positive.   We have discussed his kidney failure and high risk of progression at his past appointments.  I again discussed with him today about the risk of  progression and anticipated eventual need for dialysis and he states that he would want dialysis.  He is also interested in pursuing a transplant and with his recent decline in kidney function a referral is appropriate once more clinically stable though age may be limiting factor at many centers if he does not have a living donor.  We discussed this.  Recent labs: 01/05/20 - Cr 3.67, BUN 85, eGFR 18, Hb 10.6 01/03/20 - Cr 3.88, BUN 88, eGFR 17, K 4.5, bicarb 26, calcium 7.3, alb 3.9, phos 5.1 12/31/19 - Cr 4.34, BUN 79 (on discharge); Hb 11.2 on 12/23/19; A1c 6.6 this admit 12/05/19 - Cr 2.46, BUN 41, eGFR 30, K 4.5, bicarb 21, Ca 7.6, alb 3.9, phos 4.9, Hb 10.8, PTH 101; up/cr ratio 1766 mg/g 10/28/19 - 11/06/19 hospitalization. On 10/28/19 Cr 1.76 on admit to [facility]; Cr appears 2.7 - 2.9 on labs provided to me and was Cr 2.94 on discharge. Hb 10.1 on 11/05/19   Intake/Output Summary (Last 24 hours) at 01/19/2020 1417 Last data filed at 01/19/2020 1021 Gross per 24 hour  Intake 643 ml  Output 2525 ml  Net -1882 ml    Vitals:  Vitals:   01/19/20 0715 01/19/20 0720 01/19/20 0907 01/19/20 1137  BP:    133/70  Pulse:    68  Resp:    17  Temp:    97.8 F (36.6 C)  TempSrc:    Oral  SpO2: (!) 74% 94% 96% 95%  Weight:      Height:         Physical Exam:  General adult male in bed in no acute distress HEENT normocephalic atraumatic extraocular movements intact sclera anicteric Neck supple trachea midline Lungs basilar crackles and reduced sounds right base Heart regular rate and rhythm no rubs  Abdomen softer nontender distended Extremities no edema  Psych normal mood and affect Neuro - alert and oriented x 3 provides hx and follows commands   Medications reviewed   Labs:  BMP Latest Ref Rng & Units 01/19/2020 01/18/2020 01/17/2020  Glucose 70 - 99 mg/dL 158(H) 224(H) 156(H)  BUN 8 - 23 mg/dL 64(H) 68(H) 65(H)  Creatinine 0.61 - 1.24 mg/dL 3.60(H) 3.49(H) 3.64(H)  BUN/Creat Ratio 10 - 24 -  - -  Sodium 135 - 145 mmol/L 141 140 142  Potassium 3.5 - 5.1 mmol/L 4.2 4.4 4.3  Chloride 98 - 111 mmol/L 105 107 109  CO2 22 - 32 mmol/L $RemoveB'22 25 23  '[id number]$ Calcium 8.9 - 10.3 mg/dL 7.3(L) 6.8(L) 7.1(L)     Assessment/Plan:   # AKI - Secondary to pre-renal insults with diuresis in setting of acute hypoxic resp failure due to both acute CHF and recovering from PNA - This new higher apparent baseline may also simply represent progression of his CKD    # Acute hypoxic resp failure  - s/p thoracentesis  - diruesis as below   # Acute on Chronic diastolic CHF - Continue lasix 80 mg IV BID for now. S/p metolazone once; reassess redose on 9/17 - We will assess for when appropriate to attempt transition to PO and torsemide 40 mg BID with metolazone PRN may be a good option - strict ins and outs   # CKD stage IV - Secondary to diabetic nephropathy as well as microvascular disease from HTN. Note also ongoing pre-renal insults with some prior NSAID use. Serologic work-up negative to date and no signs of plasma cell dyscrasia.  His insight into his kidney disease has been impaired despite multiple prior discussions and this is improving  - with likely recent progression with multiple AKI events  - Does want HD when he progresses to that point and we have discussed high risk of progression at past visits.  We have discussed that given CKD progression we can discuss transplant referral further at his outpatient follow-up with me     # HTN with CKD - Continue current regimen with diuretics as above  # Secondary hyperparathyroidism and metabolic bone disease - calcitriol 0.25 mcg three times a week  - two 500 mg tablets nightly  - calcium gluconate once IV now  # Anemia secondary to CKD - no acute indication for PRBC's  Claudia Desanctis, MD 01/19/2020 3:02 PM

## 2020-01-19 NOTE — Progress Notes (Addendum)
Progress Note  Patient Name: Melvin Mcclure Date of Encounter: 01/19/2020  [facility] Cardiologist: Kirk Ruths, MD   Subjective   Breathing improving, almost back to baseline. No chest pain. Urinating well. Had conversation with him for 25-30 minutes.   Inpatient Medications    Scheduled Meds: . amLODipine  10 mg Oral Daily  . aspirin  81 mg Oral Daily  . calcitRIOL  0.25 mcg Oral Once per day on Mon Wed Fri  . calcium carbonate  2 tablet Oral Daily  . carvedilol  12.5 mg Oral BID WC  . ferrous sulfate  325 mg Oral Q breakfast  . furosemide  80 mg Intravenous BID  . gabapentin  300 mg Oral BID  . heparin  5,000 Units Subcutaneous Q8H  . hydrALAZINE  50 mg Oral TID  . insulin aspart  0-15 Units Subcutaneous TID WC  . insulin aspart  0-5 Units Subcutaneous QHS  . isosorbide mononitrate  60 mg Oral QPM  . meclizine  25 mg Oral TID  . sodium chloride flush  3 mL Intravenous Q12H   Continuous Infusions: . sodium chloride     PRN Meds: sodium chloride, acetaminophen, albuterol, lidocaine (PF), ondansetron (ZOFRAN) IV, senna-docusate, sodium chloride flush   Vital Signs    Vitals:   01/18/20 1752 01/18/20 2325 01/19/20 0548 01/19/20 0605  BP: (!) 116/59 130/67 132/70   Pulse: 62 67 70   Resp: 16 16 17    Temp: 98.2 F (36.8 C) 98.1 F (36.7 C) 98.9 F (37.2 C)   TempSrc: Oral Oral Oral   SpO2: 99% 97% 95%   Weight:    91.1 kg  Height:        Intake/Output Summary (Last 24 hours) at 01/19/2020 0759 Last data filed at 01/19/2020 0557 Gross per 24 hour  Intake 390 ml  Output 2075 ml  Net -1685 ml   Last 3 Weights 01/19/2020 01/18/2020 01/17/2020  Weight (lbs) 200 lb 14.4 oz 205 lb 200 lb  Weight (kg) 91.128 kg 92.987 kg 90.719 kg      Telemetry    NSR - Personally Reviewed  ECG    N/A  Physical Exam   GEN: No acute distress.   Neck: No JVD Cardiac: RRR, no murmurs, rubs, or gallops.  Respiratory: Clear to auscultation bilaterally. GI: Soft,  nontender, + distended  MS: No edema; No deformity. Neuro:  Nonfocal  Psych: Normal affect   Labs    High Sensitivity Troponin:   Recent Labs  Lab 12/23/19 1059 12/25/19 0456 12/25/19 0739 01/17/20 1718 01/17/20 1935  TROPONINIHS 11 13 12 10 11       Chemistry Recent Labs  Lab 01/17/20 1718 01/18/20 0430  NA 142 140  K 4.3 4.4  CL 109 107  CO2 23 25  GLUCOSE 156* 224*  BUN 65* 68*  CREATININE 3.64* 3.49*  CALCIUM 7.1* 6.8*  GFRNONAA 16* 17*  GFRAA 18* 19*  ANIONGAP 10 8     Hematology Recent Labs  Lab 01/17/20 1718  WBC 7.0  RBC 4.09*  HGB 11.1*  HCT 37.1*  MCV 90.7  MCH 27.1  MCHC 29.9*  RDW 16.9*  PLT 271     Radiology    DG Chest 1 View  Result Date: 01/18/2020 CLINICAL DATA:  Status post thoracentesis EXAM: CHEST  1 VIEW COMPARISON:  January 17, 2020 FINDINGS: No pneumothorax. Right pleural effusion nearly completely resolved after thoracentesis. There is slight bibasilar atelectasis. Lungs elsewhere clear. Heart is mildly enlarged with pulmonary  vascularity normal. No adenopathy. No bone lesions. IMPRESSION: No pneumothorax. Nearly complete resolution of right-sided pleural effusion. Bibasilar atelectasis. Stable cardiac prominence. Electronically Signed   By: Lowella Grip III M.D.   On: 01/18/2020 10:36   DG Chest 2 View  Result Date: 01/17/2020 CLINICAL DATA:  Shortness of breath EXAM: CHEST - 2 VIEW COMPARISON:  December 26, 2019 FINDINGS: The heart size and mediastinal contours are unchanged with mild cardiomegaly. A moderate right pleural effusion is seen. There is patchy airspace opacity at the right lung base which could be due to atelectasis or layering effusion. The left lung is clear. No acute osseous abnormality. IMPRESSION: Moderate right pleural effusion with adjacent patchy airspace opacity which could be atelectasis/layering effusion. Electronically Signed   By: Prudencio Pair M.D.   On: 01/17/2020 17:13   [place] ASCITES (ABDOMEN  LIMITED)  Result Date: 01/18/2020 CLINICAL DATA:  Abdominal distension EXAM: LIMITED ABDOMEN ULTRASOUND FOR ASCITES TECHNIQUE: Limited ultrasound survey for ascites was performed in all four abdominal quadrants. COMPARISON:  None. FINDINGS: Mild ascites is noted primarily within the right pericolic gutter. IMPRESSION: Mild ascites primarily on the right. Electronically Signed   By: Inez Catalina M.D.   On: 01/18/2020 13:51   IR THORACENTESIS ASP PLEURAL SPACE W/IMG GUIDE  Result Date: 01/18/2020 INDICATION: Patient history of HF, dyspnea, and recurrent right pleural effusion. Request is made for therapeutic right thoracentesis. EXAM: ULTRASOUND GUIDED THERAPEUTIC RIGHT THORACENTESIS MEDICATIONS: 10 mL 1% lidocaine COMPLICATIONS: None immediate. PROCEDURE: An ultrasound guided thoracentesis was thoroughly discussed with the patient and questions answered. The benefits, risks, alternatives and complications were also discussed. The patient understands and wishes to proceed with the procedure. Written consent was obtained. Ultrasound was performed to localize and Kimberli Winne an adequate pocket of fluid in the right chest. The area was then prepped and draped in the normal sterile fashion. 1% Lidocaine was used for local anesthesia. Under ultrasound guidance a 6 Fr Safe-T-Centesis catheter was introduced. Thoracentesis was performed. The catheter was removed and a dressing applied. FINDINGS: A total of approximately 1.75 L of hazy amber fluid was removed. IMPRESSION: Successful ultrasound guided right thoracentesis yielding 1.75 L of pleural fluid. Read by: Earley Abide, PA-C Electronically Signed   By: Ruthann Cancer MD   On: 01/18/2020 10:50    Cardiac Studies   None this admission   Patient Profile     69 y.o. male with a hx of CHF, DM, HTN, HLD, anemia, CKD, D-CHF 08/021, negative amyloid scan, who is being seen for the evaluation of CHF.   Assessment & Plan    1. Acute on chronic diastolic CHF - S/p  right thoracentesis by IR with 1.75L fluids removal - Abdominal US with mild ascites - Patient diuresed -2.1L with improved symptoms - Pending renal function check this morning - Continue IV lasix>> appreciate nephrology guidance  2. Acute on CKD IV - Scr improving with diuresis, pending lab this morning - per nephrology - He is willing to consider dialysis in future  3. HTN - BP normalized on current medications   Otherwise per primary team    For questions or updates, please contact [place] Please consult [email or web address] for contact info under        SignedLeanor Kail, PA  01/19/2020, 7:59 AM    Personally seen and examined. Agree with above.  Acute on chronic diastolic heart failure Acute AKI on top of chronic kidney disease stage IV Essential hypertension  -Breathing better but still not at baseline. -  Labs reviewed, creatinine was 4.34 on 8/28, 3.49 yesterday morning. -Appreciate nephrology's assistance, note reviewed.  Continue with high-dose IV Lasix.  Still volume overloaded. -Troponin normal. -Blood pressure improved with diuresis.  Weight unchanged.  Continue to monitor.  Candee Furbish, MD

## 2020-01-19 NOTE — Progress Notes (Addendum)
[facility]                                                                              Patient Demographics  Melvin Mcclure, is a 69 y.o. male, DOB - [birth year], GYI:[id number]  Admit date - 01/17/2020   Admitting Physician Benjerman Molinelli Krystal Eaton, MD  Outpatient Primary MD for the patient is Nolene Ebbs, MD  Outpatient specialists:   LOS - 1  days   Medical records reviewed and are as summarized below:    Chief Complaint  Patient presents with  . Shortness of Breath       Brief summary   Patient is a 69 year old male with history of CKD stage IV, diastolic CHF, hypertension, diabetes mellitus type 2.  Patient recently admitted last month for CHF, right pleural effusion.  He was placed on IV diuretics caused AKI with creatinine up to 4.3 at the time of discharge.  Also had thoracentesis, effusion was transudative.  Patient went to his cardiologist office on the day of admission for shortness of breath, noted to have recurrent pleural effusion on exam and hypoxia to 80s on room air, improved to mid 90s on 2 L via nasal cannula in ED.   Assessment & Plan    Principal Problem:   Acute respiratory failure with hypoxia (HCC) due to underlying acute on chronic diastolic CHF, recurrent right-sided pleural effusion -Breathing is improving but not still at baseline, -Status post right-sided thoracentesis by IR, 1.7 L removed -Abdominal ultrasound with mild ascites -Nephrology and cardiology following, continue IV Lasix for diuresis, currently negative balance of 1.8 L  Active Problems: Recurrent right-sided pleural effusion -IR consulted, status post right-sided thoracentesis, 1.75 L removed     DM2 (diabetes mellitus, type 2) (HCC) -CBGs stable today, continue sliding scale insulin   Acute on CKD stage 4 due to type 2 diabetes mellitus ([place]) -4.34 on discharge 8/28, diuretics were held - nephrology has been consulted, patient follows Dr. Royce Macadamia  outpatient  Essential hypertension -BP stable Code Status: Full CODE STATUS DVT Prophylaxis: Heparin subcu Family Communication: Discussed all imaging results, lab results, explained to the patient and patient's wife at the bedside on 9/15   Disposition Plan: Inpatient telemetry Inpatient level of care appropriate due to severity of illness  Dispo: The patient is from: Home              Anticipated d/c is to: Home              Anticipated d/c date is: 2 days              Patient currently is not medically stable to d/c.  Still volume overloaded, on IV diuresis      Time Spent in minutes   25 minutes  Procedures:  None  Consultants:   Nephrology, Dr. Royce Macadamia Cardiology, Dr. Marlou Porch  Antimicrobials:   Anti-infectives (From admission, onward)   None         Medications  Scheduled Meds: . amLODipine  10 mg Oral Daily  . aspirin  81 mg Oral Daily  . calcitRIOL  0.25 mcg Oral Once per day on Mon Wed  Fri  . calcium carbonate  2 tablet Oral Daily  . carvedilol  12.5 mg Oral BID WC  . ferrous sulfate  325 mg Oral Q breakfast  . furosemide  80 mg Intravenous BID  . gabapentin  300 mg Oral BID  . heparin  5,000 Units Subcutaneous Q8H  . hydrALAZINE  50 mg Oral TID  . insulin aspart  0-15 Units Subcutaneous TID WC  . insulin aspart  0-5 Units Subcutaneous QHS  . ipratropium-albuterol  3 mL Nebulization TID  . isosorbide mononitrate  60 mg Oral QPM  . meclizine  25 mg Oral TID  . sodium chloride flush  3 mL Intravenous Q12H   Continuous Infusions: . sodium chloride     PRN Meds:.sodium chloride, acetaminophen, albuterol, lidocaine (PF), ondansetron (ZOFRAN) IV, senna-docusate, sodium chloride flush      Subjective:   Melvin Mcclure was seen and examined today.  Shortness of breath is improving, still not at baseline.  No chest pain.  Afebrile.  Patient denies dizziness, new weakness, numbess, tingling. No acute events overnight.    Objective:   Vitals:    01/19/20 0715 01/19/20 0720 01/19/20 0907 01/19/20 1137  BP:    133/70  Pulse:    68  Resp:    17  Temp:    97.8 F (36.6 C)  TempSrc:    Oral  SpO2: (!) 74% 94% 96% 95%  Weight:      Height:        Intake/Output Summary (Last 24 hours) at 01/19/2020 1316 Last data filed at 01/19/2020 1021 Gross per 24 hour  Intake 643 ml  Output 2525 ml  Net -1882 ml     Wt Readings from Last 3 Encounters:  01/19/20 91.1 kg  01/17/20 95.3 kg  12/28/19 85.3 kg   Physical Exam  General: Alert and oriented x 3, NAD  Cardiovascular: S1 S2 clear,  RRR  Respiratory: CTAB, no wheezing, rales or rhonchi  Gastrointestinal: Soft, nontender, nondistended, NBS  Ext: 1+  pedal edema bilaterally  Neuro: no new deficits  Musculoskeletal: No cyanosis, clubbing  Skin: No rashes  Psych: Normal affect and demeanor, alert and oriented x3    Data Reviewed:  I have personally reviewed following labs and imaging studies  Micro Results Recent Results (from the past 240 hour(s))  Resp Panel by RT PCR (RSV, Flu A&B, Covid) - Nasopharyngeal Swab     Status: None   Collection Time: 01/17/20  9:19 PM   Specimen: Nasopharyngeal Swab  Result Value Ref Range Status   SARS Coronavirus 2 by RT PCR NEGATIVE NEGATIVE Final    Comment: (NOTE) SARS-CoV-2 target nucleic acids are NOT DETECTED.  The SARS-CoV-2 RNA is generally detectable in upper respiratoy specimens during the acute phase of infection. The lowest concentration of SARS-CoV-2 viral copies this assay can detect is 131 copies/mL. A negative result does not preclude SARS-Cov-2 infection and should not be used as the sole basis for treatment or other patient management decisions. A negative result may occur with  improper specimen collection/handling, submission of specimen other than nasopharyngeal swab, presence of viral mutation(s) within the areas targeted by this assay, and inadequate number of viral copies (<131 copies/mL). A negative  result must be combined with clinical observations, patient history, and epidemiological information. The expected result is Negative.  Fact Sheet for Patients:  PinkCheek.be  Fact Sheet for Healthcare Providers:  GravelBags.it  This test is no t yet approved or cleared by the [place] and  has been authorized for detection and/or diagnosis of SARS-CoV-2 by FDA under an Emergency Use Authorization (EUA). This EUA will remain  in effect (meaning this test can be used) for the duration of the COVID-19 declaration under Section 564(b)(1) of the Act, 21 U.S.C. section 360bbb-3(b)(1), unless the authorization is terminated or revoked sooner.     Influenza A by PCR NEGATIVE NEGATIVE Final   Influenza B by PCR NEGATIVE NEGATIVE Final    Comment: (NOTE) The Xpert Xpress SARS-CoV-2/FLU/RSV assay is intended as an aid in  the diagnosis of influenza from Nasopharyngeal swab specimens and  should not be used as a sole basis for treatment. Nasal washings and  aspirates are unacceptable for Xpert Xpress SARS-CoV-2/FLU/RSV  testing.  Fact Sheet for Patients: PinkCheek.be  Fact Sheet for Healthcare Providers: GravelBags.it  This test is not yet approved or cleared by the [place] FDA and  has been authorized for detection and/or diagnosis of SARS-CoV-2 by  FDA under an Emergency Use Authorization (EUA). This EUA will remain  in effect (meaning this test can be used) for the duration of the  Covid-19 declaration under Section 564(b)(1) of the Act, 21  U.S.C. section 360bbb-3(b)(1), unless the authorization is  terminated or revoked.    Respiratory Syncytial Virus by PCR NEGATIVE NEGATIVE Final    Comment: (NOTE) Fact Sheet for Patients: PinkCheek.be  Fact Sheet for Healthcare  Providers: GravelBags.it  This test is not yet approved or cleared by the [place] FDA and  has been authorized for detection and/or diagnosis of SARS-CoV-2 by  FDA under an Emergency Use Authorization (EUA). This EUA will remain  in effect (meaning this test can be used) for the duration of the  COVID-19 declaration under Section 564(b)(1) of the Act, 21 U.S.C.  section 360bbb-3(b)(1), unless the authorization is terminated or  revoked. Performed at [facility], [place] [address], [place], [place] [address]     Radiology Reports DG Chest 1 View  Result Date: 01/18/2020 CLINICAL DATA:  Status post thoracentesis EXAM: CHEST  1 VIEW COMPARISON:  January 17, 2020 FINDINGS: No pneumothorax. Right pleural effusion nearly completely resolved after thoracentesis. There is slight bibasilar atelectasis. Lungs elsewhere clear. Heart is mildly enlarged with pulmonary vascularity normal. No adenopathy. No bone lesions. IMPRESSION: No pneumothorax. Nearly complete resolution of right-sided pleural effusion. Bibasilar atelectasis. Stable cardiac prominence. Electronically Signed   By: Lowella Grip III M.D.   On: 01/18/2020 10:36   DG Chest 1 View  Result Date: 12/26/2019 CLINICAL DATA:  Status post right-sided thoracentesis EXAM: CHEST  1 VIEW COMPARISON:  12/23/2019 FINDINGS: Interval decrease in right-sided pleural effusion status post thoracentesis. Mild hazy right basilar opacity, significantly improved from prior. Left lung remains clear. No pneumothorax. Stable heart size. IMPRESSION: 1. Decreased right-sided pleural effusion status post thoracentesis. No pneumothorax. 2. Improved aeration of the right lung base. Electronically Signed   By: Davina Poke D.O.   On: 12/26/2019 16:14   DG Chest 2 View  Result Date: 01/17/2020 CLINICAL DATA:  Shortness of breath EXAM: CHEST - 2 VIEW COMPARISON:  December 26, 2019 FINDINGS: The heart size and mediastinal  contours are unchanged with mild cardiomegaly. A moderate right pleural effusion is seen. There is patchy airspace opacity at the right lung base which could be due to atelectasis or layering effusion. The left lung is clear. No acute osseous abnormality. IMPRESSION: Moderate right pleural effusion with adjacent patchy airspace opacity which could be atelectasis/layering effusion. Electronically Signed   By:  Prudencio Pair M.D.   On: 01/17/2020 17:13   DG Chest 2 View  Result Date: 12/23/2019 CLINICAL DATA:  Shortness of breath. EXAM: CHEST - 2 VIEW COMPARISON:  11/05/2019. FINDINGS: Cardiomegaly. Mild pulmonary venous congestion. Bilateral interstitial prominence suggesting CHF. Prominent right base atelectasis/infiltrate and moderate right pleural effusion. No pneumothorax. Degenerative change thoracic spine. IMPRESSION: 1. Cardiomegaly. Mild pulmonary venous congestion. Bilateral interstitial prominence suggesting CHF. 2. Prominent right base atelectasis/infiltrate and moderate right pleural effusion. Electronically Signed   By: Marcello Moores  Register   On: 12/23/2019 07:08   NM CARDIAC AMYLOID TUMOR LOC INFLAM SPECT 1 DAY  Result Date: 12/26/2019 CLINICAL DATA:  HEART FAILURE. CONCERN FOR CARDIAC AMYLOIDOSIS. EXAM: NUCLEAR MEDICINE TUMOR LOCALIZATION. PYP CARDIAC AMYLOIDOSIS SCAN WITH SPECT TECHNIQUE: Following intravenous administration of radiopharmaceutical, anterior planar images of the chest were obtained. Regions of interest were placed on the heart and contralateral chest wall for quantitative assessment. Additional SPECT imaging of the chest was obtained. RADIOPHARMACEUTICALS:  8 mCi TECHNETIUM 99 PYROPHOSPHATE FINDINGS: Planar Visual assessment: Anterior planar imaging demonstrates radiotracer uptake within the heart less than uptake within the adjacent ribs (Grade 1). Quantitative assessment : Quantitative assessment of the cardiac uptake compared to the contralateral chest wall is equal to 1.1 (H/CL =  1.1). SPECT assessment: SPECT imaging of the chest demonstrates minimal radiotracer accumulation within the LEFT ventricle. IMPRESSION: Visual and quantitative assessment (grade 1, H/CLL equal 1.1) are NOT suggestive of transthyretin amyloidosis. Electronically Signed   By: Suzy Bouchard M.D.   On: 12/26/2019 16:57   US RENAL  Addendum Date: 12/28/2019   ADDENDUM REPORT: 12/28/2019 11:47 ADDENDUM: Findings should read as follows: Right Kidney: Renal measurements: 9.2 x 4.6 x 5.2 cm = volume: 114 mL. Echogenic renal parenchyma with increased conspicuity between the cortex and medullary interface. No evidence of hydronephrosis or nephrolithiasis. No cystic or solid masses. Left Kidney: Renal measurements: 10.3 x 4.8 x 4.8 cm = volume: 123 mL. Echogenic renal parenchyma with increased conspicuity between the cortex and medullary interface. No evidence of hydronephrosis or nephrolithiasis. No cystic or solid masses. Electronically Signed   By: Jacqulynn Cadet M.D.   On: 12/28/2019 11:47   Result Date: 12/28/2019 CLINICAL DATA:  69 year old male with stage 4 chronic kidney disease EXAM: RENAL / URINARY TRACT ULTRASOUND COMPLETE COMPARISON:  None. FINDINGS: Right Kidney: Renal measurements: 9.2 x 4.6 x 5.2 cm = volume: 114 mL. Echogenic renal parenchyma with increased conspicuity between the cortex and medullary interface. No evidence of hydronephrosis or nephrolithiasis. No cystic or solid masses. Left Kidney: Renal measurements: 10.3 x 4.8 x 4.8 cm = volume: 123 mL. She denies any prior health problems or medication use. She started feeling short of breath about 3 days prior to admission. Associated with chest pressure in mid chest and nausea. Came to ER and CT angio showed PE. She was started on heparin gtt and supplemental oxygen. BP 176/104, HR 104, SpO2 96% on 2 liters oxygen during my examination of her. She reports improvement in dyspnea and chest pain. Denies syncopal episodes. Bladder: Appears normal  for degree of bladder distention. Good visualization of both ureteral jets. Other: Moderate right pleural effusion. IMPRESSION: 1. No evidence of hydronephrosis. 2. Echogenic renal parenchyma bilaterally consistent with medical renal disease. 3. Moderate right pleural effusion. Electronically Signed: By: Jacqulynn Cadet M.D. On: 12/25/2019 12:40   [place] ASCITES (ABDOMEN LIMITED)  Result Date: 01/18/2020 CLINICAL DATA:  Abdominal distension EXAM: LIMITED ABDOMEN ULTRASOUND FOR ASCITES TECHNIQUE: Limited ultrasound survey for ascites  was performed in all four abdominal quadrants. COMPARISON:  None. FINDINGS: Mild ascites is noted primarily within the right pericolic gutter. IMPRESSION: Mild ascites primarily on the right. Electronically Signed   By: Inez Catalina M.D.   On: 01/18/2020 13:51   VAS US RENAL ARTERY DUPLEX  Result Date: 12/27/2019 ABDOMINAL VISCERAL Indications: Hypertension. High Risk Factors: Hyperlipidemia, Diabetes, coronary artery disease. Other Factors: Stage IV chronic kidney disease. Comparison Study: No prior. Performing Technologist: Oda Cogan RDMS, RVT  Examination Guidelines: A complete evaluation includes B-mode imaging, spectral Doppler, color Doppler, and power Doppler as needed of all accessible portions of each vessel. Bilateral testing is considered an integral part of a complete examination. Limited examinations for reoccurring indications may be performed as noted.  Duplex Findings: +--------------------+--------+--------+------+--------+ Mesenteric          PSV cm/sEDV cm/sPlaqueComments +--------------------+--------+--------+------+--------+ Aorta Mid              92                          +--------------------+--------+--------+------+--------+ Celiac Artery Origin  269                          +--------------------+--------+--------+------+--------+ SMA Proximal          222                           +--------------------+--------+--------+------+--------+    +------------------+--------+--------+-------+ Right Renal ArteryPSV cm/sEDV cm/sComment +------------------+--------+--------+-------+ Origin              123      16           +------------------+--------+--------+-------+ Proximal            122      17           +------------------+--------+--------+-------+ Mid                  58      13           +------------------+--------+--------+-------+ Distal               59      12           +------------------+--------+--------+-------+ +-----------------+--------+--------+-------+ Left Renal ArteryPSV cm/sEDV cm/sComment +-----------------+--------+--------+-------+ Origin              69      10           +-----------------+--------+--------+-------+ Proximal            72      10           +-----------------+--------+--------+-------+ Mid                 46      9            +-----------------+--------+--------+-------+ Distal              42      10           +-----------------+--------+--------+-------+ +------------+--------+--------+----+-----------+--------+--------+----+ Right KidneyPSV cm/sEDV cm/sRI  Left KidneyPSV cm/sEDV cm/sRI   +------------+--------+--------+----+-----------+--------+--------+----+ Upper Pole  23      6       0.76Upper Pole 27      7       0.74 +------------+--------+--------+----+-----------+--------+--------+----+ Mid         29      6  0.81Mid        30      6       0.81 +------------+--------+--------+----+-----------+--------+--------+----+ Lower Pole  24                  Lower Pole 26      8       0.69 +------------+--------+--------+----+-----------+--------+--------+----+ Hilar       45      8       0.82Hilar      24      7       0.73 +------------+--------+--------+----+-----------+--------+--------+----+ +------------------+-----+------------------+-----+ Right  Kidney           Left Kidney             +------------------+-----+------------------+-----+ RAR                    RAR                     +------------------+-----+------------------+-----+ RAR (manual)      1.3  RAR (manual)      0.78  +------------------+-----+------------------+-----+ Cortex                 Cortex                  +------------------+-----+------------------+-----+ Cortex thickness       Corex thickness         +------------------+-----+------------------+-----+ Kidney length (cm)11.40Kidney length (cm)10.07 +------------------+-----+------------------+-----+  Summary: Renal:  Right: No evidence of right renal artery stenosis. Abnormal right        Resistive Index. Left:  No evidence of left renal artery stenosis. Abnormal left        Resisitve Index.  *See table(s) above for measurements and observations.  Diagnosing physician: Harold Barban MD  Electronically signed by Harold Barban MD on 12/27/2019 at 5:40:10 PM.    Final    IR THORACENTESIS ASP PLEURAL SPACE W/IMG GUIDE  Result Date: 01/18/2020 INDICATION: Patient history of HF, dyspnea, and recurrent right pleural effusion. Request is made for therapeutic right thoracentesis. EXAM: ULTRASOUND GUIDED THERAPEUTIC RIGHT THORACENTESIS MEDICATIONS: 10 mL 1% lidocaine COMPLICATIONS: None immediate. PROCEDURE: An ultrasound guided thoracentesis was thoroughly discussed with the patient and questions answered. The benefits, risks, alternatives and complications were also discussed. The patient understands and wishes to proceed with the procedure. Written consent was obtained. Ultrasound was performed to localize and mark an adequate pocket of fluid in the right chest. The area was then prepped and draped in the normal sterile fashion. 1% Lidocaine was used for local anesthesia. Under ultrasound guidance a 6 Fr Safe-T-Centesis catheter was introduced. Thoracentesis was performed. The catheter was removed and a  dressing applied. FINDINGS: A total of approximately 1.75 L of hazy amber fluid was removed. IMPRESSION: Successful ultrasound guided right thoracentesis yielding 1.75 L of pleural fluid. Read by: Earley Abide, PA-C Electronically Signed   By: Ruthann Cancer MD   On: 01/18/2020 10:50   IR THORACENTESIS ASP PLEURAL SPACE W/IMG GUIDE  Result Date: 12/26/2019 INDICATION: Patient with history of CHF, now with right pleural effusion. Request is made for diagnostic and therapeutic thoracentesis. EXAM: ULTRASOUND GUIDED DIAGNOSTIC AND THERAPEUTIC RIGHT THORACENTESIS MEDICATIONS: 10 mL 1% lidocaine COMPLICATIONS: None immediate. PROCEDURE: An ultrasound guided thoracentesis was thoroughly discussed with the patient and questions answered. The benefits, risks, alternatives and complications were also discussed. The patient understands and wishes to proceed with the procedure. Written consent was obtained. Ultrasound was performed to localize  and mark an adequate pocket of fluid in the right chest. The area was then prepped and draped in the normal sterile fashion. 1% Lidocaine was used for local anesthesia. Under ultrasound guidance a 6 Fr Safe-T-Centesis catheter was introduced. Thoracentesis was performed. The catheter was removed and a dressing applied. FINDINGS: A total of approximately 700 mL of amber fluid was removed. Samples were sent to the laboratory as requested by the clinical team. IMPRESSION: Successful ultrasound guided diagnostic and therapeutic right thoracentesis yielding 700 mL of pleural fluid. Read by: Brynda Greathouse PA-C No pneumothorax on follow-up radiograph. Electronically Signed   By: Lucrezia Europe M.D.   On: 12/26/2019 15:47    Lab Data:  CBC: Recent Labs  Lab 01/17/20 1718  WBC 7.0  HGB 11.1*  HCT 37.1*  MCV 90.7  PLT 009   Basic Metabolic Panel: Recent Labs  Lab 01/17/20 1718 01/18/20 0430 01/19/20 0934  NA 142 140 141  K 4.3 4.4 4.2  CL 109 107 105  CO2 23 25 22    GLUCOSE 156* 224* 158*  BUN 65* 68* 64*  CREATININE 3.64* 3.49* 3.60*  CALCIUM 7.1* 6.8* 7.3*   GFR: Estimated Creatinine Clearance: 21.7 mL/min (A) (by C-G formula based on SCr of 3.6 mg/dL (H)). Liver Function Tests: No results for input(s): AST, ALT, ALKPHOS, BILITOT, PROT, ALBUMIN in the last 168 hours. No results for input(s): LIPASE, AMYLASE in the last 168 hours. No results for input(s): AMMONIA in the last 168 hours. Coagulation Profile: No results for input(s): INR, PROTIME in the last 168 hours. Cardiac Enzymes: No results for input(s): CKTOTAL, CKMB, CKMBINDEX, TROPONINI in the last 168 hours. BNP (last 3 results) No results for input(s): PROBNP in the last 8760 hours. HbA1C: No results for input(s): HGBA1C in the last 72 hours. CBG: Recent Labs  Lab 01/18/20 1211 01/18/20 1340 01/18/20 1614 01/18/20 2121 01/19/20 0603  GLUCAP 67* 88 114* 170* 109*   Lipid Profile: No results for input(s): CHOL, HDL, LDLCALC, TRIG, CHOLHDL, LDLDIRECT in the last 72 hours. Thyroid Function Tests: No results for input(s): TSH, T4TOTAL, FREET4, T3FREE, THYROIDAB in the last 72 hours. Anemia Panel: No results for input(s): VITAMINB12, FOLATE, FERRITIN, TIBC, IRON, RETICCTPCT in the last 72 hours. Urine analysis:    Component Value Date/Time   COLORURINE YELLOW 12/28/2019 1850   APPEARANCEUR CLEAR 12/28/2019 1850   LABSPEC 1.014 12/28/2019 1850   PHURINE 5.0 12/28/2019 1850   GLUCOSEU NEGATIVE 12/28/2019 1850   HGBUR NEGATIVE 12/28/2019 1850   BILIRUBINUR NEGATIVE 12/28/2019 1850   KETONESUR NEGATIVE 12/28/2019 1850   PROTEINUR 30 (A) 12/28/2019 1850   UROBILINOGEN 0.2 06/17/2010 1620   NITRITE NEGATIVE 12/28/2019 1850   LEUKOCYTESUR NEGATIVE 12/28/2019 1850     Elizer Bostic M.D. [facility] 01/19/2020, 1:16 PM   Call night coverage person covering after 7pm

## 2020-01-20 LAB — GLUCOSE, CAPILLARY
Glucose-Capillary: 159 mg/dL — ABNORMAL HIGH (ref 70–99)
Glucose-Capillary: 99 mg/dL (ref 70–99)
Glucose-Capillary: 220 mg/dL — ABNORMAL HIGH (ref 70–99)
Glucose-Capillary: 110 mg/dL — ABNORMAL HIGH (ref 70–99)

## 2020-01-20 LAB — PHOSPHORUS: Phosphorus: 6.2 mg/dL — ABNORMAL HIGH (ref 2.5–4.6)

## 2020-01-20 LAB — BASIC METABOLIC PANEL
Anion gap: 8 (ref 5–15)
BUN: 65 mg/dL — ABNORMAL HIGH (ref 8–23)
CO2: 27 mmol/L (ref 22–32)
Calcium: 7.3 mg/dL — ABNORMAL LOW (ref 8.9–10.3)
Chloride: 101 mmol/L (ref 98–111)
Creatinine, Ser: 3.49 mg/dL — ABNORMAL HIGH (ref 0.61–1.24)
GFR calc Af Amer: 19 mL/min — ABNORMAL LOW (ref >60)
GFR calc non Af Amer: 17 mL/min — ABNORMAL LOW (ref >60)
Glucose, Bld: 174 mg/dL — ABNORMAL HIGH (ref 70–99)
Potassium: 4 mmol/L (ref 3.5–5.1)
Sodium: 136 mmol/L (ref 135–145)

## 2020-01-20 MED ORDER — CALCIUM GLUCONATE-NACL 1-0.675 GM/50ML-% IV SOLN
1.0000 g | Freq: Once | INTRAVENOUS | Status: AC
  Administered 2020-01-20: 1000 mg via INTRAVENOUS
  Filled 2020-01-20: qty 50

## 2020-01-20 MED ORDER — METOLAZONE 5 MG PO TABS
5.0000 mg | ORAL_TABLET | Freq: Once | ORAL | Status: AC
  Administered 2020-01-20: 5 mg via ORAL
  Filled 2020-01-20: qty 1

## 2020-01-20 NOTE — Progress Notes (Addendum)
Progress Note  Patient Name: Melvin Mcclure Date of Encounter: 01/20/2020  [facility] Cardiologist: Kirk Ruths, MD   Subjective   No chest pain, breathing improving, close to baseline.   Inpatient Medications    Scheduled Meds: . amLODipine  10 mg Oral Daily  . aspirin  81 mg Oral Daily  . calcitRIOL  0.25 mcg Oral Once per day on Mon Wed Fri  . calcium carbonate  2 tablet Oral Daily  . carvedilol  12.5 mg Oral BID WC  . ferrous sulfate  325 mg Oral Q breakfast  . furosemide  80 mg Intravenous BID  . gabapentin  300 mg Oral BID  . heparin  5,000 Units Subcutaneous Q8H  . hydrALAZINE  50 mg Oral TID  . insulin aspart  0-15 Units Subcutaneous TID WC  . insulin aspart  0-5 Units Subcutaneous QHS  . ipratropium-albuterol  3 mL Nebulization TID  . isosorbide mononitrate  60 mg Oral QPM  . meclizine  25 mg Oral TID  . sodium chloride flush  3 mL Intravenous Q12H   Continuous Infusions: . sodium chloride     PRN Meds: sodium chloride, acetaminophen, albuterol, lidocaine (PF), ondansetron (ZOFRAN) IV, senna-docusate, sodium chloride flush   Vital Signs    Vitals:   01/19/20 2200 01/19/20 2334 01/20/20 0553 01/20/20 0850  BP: 128/61 134/70 136/68   Pulse: 67 69 67   Resp: 16 17 17 18   Temp: 98.9 F (37.2 C) 98.4 F (36.9 C) 98.4 F (36.9 C)   TempSrc: Oral Oral Oral   SpO2: 95% 94% 98%   Weight:   89.4 kg   Height:        Intake/Output Summary (Last 24 hours) at 01/20/2020 0949 Last data filed at 01/20/2020 0945 Gross per 24 hour  Intake 533 ml  Output 3100 ml  Net -2567 ml   Last 3 Weights 01/20/2020 01/19/2020 01/18/2020  Weight (lbs) 197 lb 1.6 oz 200 lb 14.4 oz 205 lb  Weight (kg) 89.404 kg 91.128 kg 92.987 kg      Telemetry    Sinus rhythm- Personally Reviewed  ECG    N/A  Physical Exam   GEN: No acute distress.   Neck: No JVD Cardiac: RRR, no murmurs, rubs, or gallops.  Respiratory: Clear to auscultation bilaterally. GI: Soft,  nontender, distended  MS: No edema; No deformity. Neuro:  Nonfocal  Psych: Normal affect   Labs    High Sensitivity Troponin:   Recent Labs  Lab 12/23/19 1059 12/25/19 0456 12/25/19 0739 01/17/20 1718 01/17/20 1935  TROPONINIHS 11 13 12 10 11       Chemistry Recent Labs  Lab 01/18/20 0430 01/19/20 0934 01/20/20 0536  NA 140 141 136  K 4.4 4.2 4.0  CL 107 105 101  CO2 25 22 27   GLUCOSE 224* 158* 174*  BUN 68* 64* 65*  CREATININE 3.49* 3.60* 3.49*  CALCIUM 6.8* 7.3* 7.3*  GFRNONAA 17* 16* 17*  GFRAA 19* 19* 19*  ANIONGAP 8 14 8      Hematology Recent Labs  Lab 01/17/20 1718  WBC 7.0  RBC 4.09*  HGB 11.1*  HCT 37.1*  MCV 90.7  MCH 27.1  MCHC 29.9*  RDW 16.9*  PLT 271    Radiology    DG Chest 1 View  Result Date: 01/18/2020 CLINICAL DATA:  Status post thoracentesis EXAM: CHEST  1 VIEW COMPARISON:  January 17, 2020 FINDINGS: No pneumothorax. Right pleural effusion nearly completely resolved after thoracentesis. There is slight bibasilar  atelectasis. Lungs elsewhere clear. Heart is mildly enlarged with pulmonary vascularity normal. No adenopathy. No bone lesions. IMPRESSION: No pneumothorax. Nearly complete resolution of right-sided pleural effusion. Bibasilar atelectasis. Stable cardiac prominence. Electronically Signed   By: Lowella Grip III M.D.   On: 01/18/2020 10:36   [place] ASCITES (ABDOMEN LIMITED)  Result Date: 01/18/2020 CLINICAL DATA:  Abdominal distension EXAM: LIMITED ABDOMEN ULTRASOUND FOR ASCITES TECHNIQUE: Limited ultrasound survey for ascites was performed in all four abdominal quadrants. COMPARISON:  None. FINDINGS: Mild ascites is noted primarily within the right pericolic gutter. IMPRESSION: Mild ascites primarily on the right. Electronically Signed   By: Inez Catalina M.D.   On: 01/18/2020 13:51   IR THORACENTESIS ASP PLEURAL SPACE W/IMG GUIDE  Result Date: 01/18/2020 INDICATION: Patient history of HF, dyspnea, and recurrent right pleural  effusion. Request is made for therapeutic right thoracentesis. EXAM: ULTRASOUND GUIDED THERAPEUTIC RIGHT THORACENTESIS MEDICATIONS: 10 mL 1% lidocaine COMPLICATIONS: None immediate. PROCEDURE: An ultrasound guided thoracentesis was thoroughly discussed with the patient and questions answered. The benefits, risks, alternatives and complications were also discussed. The patient understands and wishes to proceed with the procedure. Written consent was obtained. Ultrasound was performed to localize and Tyress Loden an adequate pocket of fluid in the right chest. The area was then prepped and draped in the normal sterile fashion. 1% Lidocaine was used for local anesthesia. Under ultrasound guidance a 6 Fr Safe-T-Centesis catheter was introduced. Thoracentesis was performed. The catheter was removed and a dressing applied. FINDINGS: A total of approximately 1.75 L of hazy amber fluid was removed. IMPRESSION: Successful ultrasound guided right thoracentesis yielding 1.75 L of pleural fluid. Read by: Earley Abide, PA-C Electronically Signed   By: Ruthann Cancer MD   On: 01/18/2020 10:50    Cardiac Studies   N/A  Patient Profile     69 y.o. male with a hx of CHF, DM, HTN, HLD, anemia, CKD, D-CHF 08/021,negative amyloid scan, who is being seen for the evaluation ofCHF.   Assessment & Plan    1. Acute on chronic diastolic CHF - S/p right thoracentesis by IR with 1.75L fluids removal - Abdominal US with mild ascites - Patient diuresed -4.4L with improved symptoms, breathing close to baseline. However requiring oxygen. Consider need to oxygen as outpatient. [place] down to 197lb from 200lb.  - Continue IV lasix>> appreciate nephrology guidance  2. Acute on CKD IV - Scr improving with diuresis - per nephrology - He is willing to consider dialysis in future  3. HTN - BP normalized on current medications   For questions or updates, please contact [place] Please consult [email or web address] for contact info  under        SignedLeanor Kail, PA  01/20/2020, 9:49 AM    Personally seen and examined. Agree with above.   69 year old male here with acute on chronic diastolic heart failure with chronic kidney disease stage IV at baseline progressing.  Continue with IV Lasix 80 mg twice a day.  Good improve diuresis yesterday with addition of 5 mg of metolazone.  I think it makes sense to utilize this once again today. Appreciate Dr. Luis Abed assistance with nephrology.  Candee Furbish, MD

## 2020-01-20 NOTE — Progress Notes (Signed)
[facility]                                                                              Patient Demographics  Melvin Mcclure, is a 69 y.o. male, DOB - February 27, 1951, FWY:[id number]  Admit date - 01/17/2020   Admitting Physician Rosalva Neary Krystal Eaton, MD  Outpatient Primary MD for the patient is Nolene Ebbs, MD  Outpatient specialists:   LOS - 2  days   Medical records reviewed and are as summarized below:    Chief Complaint  Patient presents with  . Shortness of Breath       Brief summary   Patient is a 69 year old male with history of CKD stage IV, diastolic CHF, hypertension, diabetes mellitus type 2.  Patient recently admitted last month for CHF, right pleural effusion.  He was placed on IV diuretics caused AKI with creatinine up to 4.3 at the time of discharge.  Also had thoracentesis, effusion was transudative.  Patient went to his cardiologist office on the day of admission for shortness of breath, noted to have recurrent pleural effusion on exam and hypoxia to 80s on room air, improved to mid 90s on 2 L via nasal cannula in ED.   Assessment & Plan    Principal Problem:   Acute respiratory failure with hypoxia (HCC) due to underlying acute on chronic diastolic CHF, recurrent right-sided pleural effusion -Breathing is improving but not still at baseline, -Status post right-sided thoracentesis by IR, 1.7 L removed -Abdominal ultrasound with mild ascites -Cardiology, nephrology following, on IV Lasix 80 mg twice a day, received 5 mg of metolazone on 9/16, negative balance of 4.7 L, weight down from 205-> 197lbs  Active Problems: Recurrent right-sided pleural effusion -IR consulted, status post right-sided thoracentesis, 1.75 L removed, transudative     DM2 (diabetes mellitus, type 2) ([place]) -CBGs remained stable, continue SSI   Acute on CKD stage 4 due to type 2 diabetes mellitus [facility]) -nephrology has been consulted, patient follows Dr. Royce Macadamia  outpatient -Creatinine 3.6 on admission, improving to 3.4, on diuresis, follow closely  Essential hypertension -BP stable Code Status: Full CODE STATUS DVT Prophylaxis: Heparin subcu Family Communication: Discussed all imaging results, lab results, explained to the patient    Disposition Plan: Inpatient telemetry Inpatient level of care appropriate due to severity of illness  Dispo: The patient is from: Home              Anticipated d/c is to: Home              Anticipated d/c date is: 2 days              Patient currently is not medically stable to d/c.  Still volume overloaded, on IV diuresis, plan per cardiology, nephrology   Time Spent in minutes   25 minutes  Procedures:  None  Consultants:   Nephrology, Dr. Royce Macadamia Cardiology, Dr. Marlou Porch  Antimicrobials:   Anti-infectives (From admission, onward)   None         Medications  Scheduled Meds: . amLODipine  10 mg Oral Daily  . aspirin  81 mg Oral Daily  . calcitRIOL  0.25 mcg Oral Once per day on Mon Wed Fri  . calcium carbonate  2 tablet Oral Daily  . carvedilol  12.5 mg Oral BID WC  . ferrous sulfate  325 mg Oral Q breakfast  . furosemide  80 mg Intravenous BID  . gabapentin  300 mg Oral BID  . heparin  5,000 Units Subcutaneous Q8H  . hydrALAZINE  50 mg Oral TID  . insulin aspart  0-15 Units Subcutaneous TID WC  . insulin aspart  0-5 Units Subcutaneous QHS  . ipratropium-albuterol  3 mL Nebulization TID  . isosorbide mononitrate  60 mg Oral QPM  . meclizine  25 mg Oral TID  . sodium chloride flush  3 mL Intravenous Q12H   Continuous Infusions: . sodium chloride     PRN Meds:.sodium chloride, acetaminophen, albuterol, lidocaine (PF), ondansetron (ZOFRAN) IV, senna-docusate, sodium chloride flush      Subjective:   Melvin Mcclure was seen and examined today.  No chest pain, shortness of breath improving, no dizziness or lightheadedness.  No nausea vomiting or diarrhea.  No acute events overnight.    Objective:   Vitals:   01/19/20 2334 01/20/20 0553 01/20/20 0850 01/20/20 1251  BP: 134/70 136/68  127/67  Pulse: 69 67  69  Resp: 17 17 18 18   Temp: 98.4 F (36.9 C) 98.4 F (36.9 C)  99.1 F (37.3 C)  TempSrc: Oral Oral  Oral  SpO2: 94% 98%  93%  Weight:  89.4 kg    Height:        Intake/Output Summary (Last 24 hours) at 01/20/2020 1432 Last data filed at 01/20/2020 1305 Gross per 24 hour  Intake 530 ml  Output 3350 ml  Net -2820 ml     Wt Readings from Last 3 Encounters:  01/20/20 89.4 kg  01/17/20 95.3 kg  12/28/19 85.3 kg   Physical Exam  General: Alert and oriented x 3, NAD  Cardiovascular: S1 S2 clear, RRR. No pedal edema b/l  Respiratory: CTAB, no wheezing, rales or rhonchi  Gastrointestinal: Soft, nontender, nondistended, NBS  Ext: no pedal edema bilaterally  Neuro: no new deficits  Musculoskeletal: No cyanosis, clubbing  Skin: No rashes  Psych: Normal affect and demeanor, alert and oriented x3      Data Reviewed:  I have personally reviewed following labs and imaging studies  Micro Results Recent Results (from the past 240 hour(s))  Resp Panel by RT PCR (RSV, Flu A&B, Covid) - Nasopharyngeal Swab     Status: None   Collection Time: 01/17/20  9:19 PM   Specimen: Nasopharyngeal Swab  Result Value Ref Range Status   SARS Coronavirus 2 by RT PCR NEGATIVE NEGATIVE Final    Comment: (NOTE) SARS-CoV-2 target nucleic acids are NOT DETECTED.  The SARS-CoV-2 RNA is generally detectable in upper respiratoy specimens during the acute phase of infection. The lowest concentration of SARS-CoV-2 viral copies this assay can detect is 131 copies/mL. A negative result does not preclude SARS-Cov-2 infection and should not be used as the sole basis for treatment or other patient management decisions. A negative result may occur with  improper specimen collection/handling, submission of specimen other than nasopharyngeal swab, presence of viral  mutation(s) within the areas targeted by this assay, and inadequate number of viral copies (<131 copies/mL). A negative result must be combined with clinical observations, patient history, and epidemiological information. The expected result is Negative.  Fact Sheet for Patients:  PinkCheek.be  Fact Sheet for Healthcare Providers:  GravelBags.it  This test  is no t yet approved or cleared by the [place] and  has been authorized for detection and/or diagnosis of SARS-CoV-2 by FDA under an Emergency Use Authorization (EUA). This EUA will remain  in effect (meaning this test can be used) for the duration of the COVID-19 declaration under Section 564(b)(1) of the Act, 21 U.S.C. section 360bbb-3(b)(1), unless the authorization is terminated or revoked sooner.     Influenza A by PCR NEGATIVE NEGATIVE Final   Influenza B by PCR NEGATIVE NEGATIVE Final    Comment: (NOTE) The Xpert Xpress SARS-CoV-2/FLU/RSV assay is intended as an aid in  the diagnosis of influenza from Nasopharyngeal swab specimens and  should not be used as a sole basis for treatment. Nasal washings and  aspirates are unacceptable for Xpert Xpress SARS-CoV-2/FLU/RSV  testing.  Fact Sheet for Patients: PinkCheek.be  Fact Sheet for Healthcare Providers: GravelBags.it  This test is not yet approved or cleared by the [place] FDA and  has been authorized for detection and/or diagnosis of SARS-CoV-2 by  FDA under an Emergency Use Authorization (EUA). This EUA will remain  in effect (meaning this test can be used) for the duration of the  Covid-19 declaration under Section 564(b)(1) of the Act, 21  U.S.C. section 360bbb-3(b)(1), unless the authorization is  terminated or revoked.    Respiratory Syncytial Virus by PCR NEGATIVE NEGATIVE Final    Comment: (NOTE) Fact Sheet for  Patients: PinkCheek.be  Fact Sheet for Healthcare Providers: GravelBags.it  This test is not yet approved or cleared by the [place] FDA and  has been authorized for detection and/or diagnosis of SARS-CoV-2 by  FDA under an Emergency Use Authorization (EUA). This EUA will remain  in effect (meaning this test can be used) for the duration of the  COVID-19 declaration under Section 564(b)(1) of the Act, 21 U.S.C.  section 360bbb-3(b)(1), unless the authorization is terminated or  revoked. Performed at [facility], [place] [address], [place], [place] [address]     Radiology Reports DG Chest 1 View  Result Date: 01/18/2020 CLINICAL DATA:  Status post thoracentesis EXAM: CHEST  1 VIEW COMPARISON:  January 17, 2020 FINDINGS: No pneumothorax. Right pleural effusion nearly completely resolved after thoracentesis. There is slight bibasilar atelectasis. Lungs elsewhere clear. Heart is mildly enlarged with pulmonary vascularity normal. No adenopathy. No bone lesions. IMPRESSION: No pneumothorax. Nearly complete resolution of right-sided pleural effusion. Bibasilar atelectasis. Stable cardiac prominence. Electronically Signed   By: Lowella Grip III M.D.   On: 01/18/2020 10:36   DG Chest 1 View  Result Date: 12/26/2019 CLINICAL DATA:  Status post right-sided thoracentesis EXAM: CHEST  1 VIEW COMPARISON:  12/23/2019 FINDINGS: Interval decrease in right-sided pleural effusion status post thoracentesis. Mild hazy right basilar opacity, significantly improved from prior. Left lung remains clear. No pneumothorax. Stable heart size. IMPRESSION: 1. Decreased right-sided pleural effusion status post thoracentesis. No pneumothorax. 2. Improved aeration of the right lung base. Electronically Signed   By: Davina Poke D.O.   On: 12/26/2019 16:14   DG Chest 2 View  Result Date: 01/17/2020 CLINICAL DATA:  Shortness of breath EXAM: CHEST  - 2 VIEW COMPARISON:  December 26, 2019 FINDINGS: The heart size and mediastinal contours are unchanged with mild cardiomegaly. A moderate right pleural effusion is seen. There is patchy airspace opacity at the right lung base which could be due to atelectasis or layering effusion. The left lung is clear. No acute osseous abnormality. IMPRESSION: Moderate right pleural effusion with  adjacent patchy airspace opacity which could be atelectasis/layering effusion. Electronically Signed   By: Prudencio Pair M.D.   On: 01/17/2020 17:13   DG Chest 2 View  Result Date: 12/23/2019 CLINICAL DATA:  Shortness of breath. EXAM: CHEST - 2 VIEW COMPARISON:  11/05/2019. FINDINGS: Cardiomegaly. Mild pulmonary venous congestion. Bilateral interstitial prominence suggesting CHF. Prominent right base atelectasis/infiltrate and moderate right pleural effusion. No pneumothorax. Degenerative change thoracic spine. IMPRESSION: 1. Cardiomegaly. Mild pulmonary venous congestion. Bilateral interstitial prominence suggesting CHF. 2. Prominent right base atelectasis/infiltrate and moderate right pleural effusion. Electronically Signed   By: Marcello Moores  Register   On: 12/23/2019 07:08   NM CARDIAC AMYLOID TUMOR LOC INFLAM SPECT 1 DAY  Result Date: 12/26/2019 CLINICAL DATA:  HEART FAILURE. CONCERN FOR CARDIAC AMYLOIDOSIS. EXAM: NUCLEAR MEDICINE TUMOR LOCALIZATION. PYP CARDIAC AMYLOIDOSIS SCAN WITH SPECT TECHNIQUE: Following intravenous administration of radiopharmaceutical, anterior planar images of the chest were obtained. Regions of interest were placed on the heart and contralateral chest wall for quantitative assessment. Additional SPECT imaging of the chest was obtained. RADIOPHARMACEUTICALS:  8 mCi TECHNETIUM 99 PYROPHOSPHATE FINDINGS: Planar Visual assessment: Anterior planar imaging demonstrates radiotracer uptake within the heart less than uptake within the adjacent ribs (Grade 1). Quantitative assessment : Quantitative assessment of the  cardiac uptake compared to the contralateral chest wall is equal to 1.1 (H/CL = 1.1). SPECT assessment: SPECT imaging of the chest demonstrates minimal radiotracer accumulation within the LEFT ventricle. IMPRESSION: Visual and quantitative assessment (grade 1, H/CLL equal 1.1) are NOT suggestive of transthyretin amyloidosis. Electronically Signed   By: Suzy Bouchard M.D.   On: 12/26/2019 16:57   US RENAL  Addendum Date: 12/28/2019   ADDENDUM REPORT: 12/28/2019 11:47 ADDENDUM: Findings should read as follows: Right Kidney: Renal measurements: 9.2 x 4.6 x 5.2 cm = volume: 114 mL. Echogenic renal parenchyma with increased conspicuity between the cortex and medullary interface. No evidence of hydronephrosis or nephrolithiasis. No cystic or solid masses. Left Kidney: Renal measurements: 10.3 x 4.8 x 4.8 cm = volume: 123 mL. Echogenic renal parenchyma with increased conspicuity between the cortex and medullary interface. No evidence of hydronephrosis or nephrolithiasis. No cystic or solid masses. Electronically Signed   By: Jacqulynn Cadet M.D.   On: 12/28/2019 11:47   Result Date: 12/28/2019 CLINICAL DATA:  69 year old male with stage 4 chronic kidney disease EXAM: RENAL / URINARY TRACT ULTRASOUND COMPLETE COMPARISON:  None. FINDINGS: Right Kidney: Renal measurements: 9.2 x 4.6 x 5.2 cm = volume: 114 mL. Echogenic renal parenchyma with increased conspicuity between the cortex and medullary interface. No evidence of hydronephrosis or nephrolithiasis. No cystic or solid masses. Left Kidney: Renal measurements: 10.3 x 4.8 x 4.8 cm = volume: 123 mL. She denies any prior health problems or medication use. She started feeling short of breath about 3 days prior to admission. Associated with chest pressure in mid chest and nausea. Came to ER and CT angio showed PE. She was started on heparin gtt and supplemental oxygen. BP 176/104, HR 104, SpO2 96% on 2 liters oxygen during my examination of her. She reports  improvement in dyspnea and chest pain. Denies syncopal episodes. Bladder: Appears normal for degree of bladder distention. Good visualization of both ureteral jets. Other: Moderate right pleural effusion. IMPRESSION: 1. No evidence of hydronephrosis. 2. Echogenic renal parenchyma bilaterally consistent with medical renal disease. 3. Moderate right pleural effusion. Electronically Signed: By: Jacqulynn Cadet M.D. On: 12/25/2019 12:40   [place] ASCITES (ABDOMEN LIMITED)  Result Date: 01/18/2020 CLINICAL DATA:  Abdominal distension EXAM: LIMITED ABDOMEN ULTRASOUND FOR ASCITES TECHNIQUE: Limited ultrasound survey for ascites was performed in all four abdominal quadrants. COMPARISON:  None. FINDINGS: Mild ascites is noted primarily within the right pericolic gutter. IMPRESSION: Mild ascites primarily on the right. Electronically Signed   By: Inez Catalina M.D.   On: 01/18/2020 13:51   VAS US RENAL ARTERY DUPLEX  Result Date: 12/27/2019 ABDOMINAL VISCERAL Indications: Hypertension. High Risk Factors: Hyperlipidemia, Diabetes, coronary artery disease. Other Factors: Stage IV chronic kidney disease. Comparison Study: No prior. Performing Technologist: Oda Cogan RDMS, RVT  Examination Guidelines: A complete evaluation includes B-mode imaging, spectral Doppler, color Doppler, and power Doppler as needed of all accessible portions of each vessel. Bilateral testing is considered an integral part of a complete examination. Limited examinations for reoccurring indications may be performed as noted.  Duplex Findings: +--------------------+--------+--------+------+--------+ Mesenteric          PSV cm/sEDV cm/sPlaqueComments +--------------------+--------+--------+------+--------+ Aorta Mid              92                          +--------------------+--------+--------+------+--------+ Celiac Artery Origin  269                          +--------------------+--------+--------+------+--------+ SMA  Proximal          222                          +--------------------+--------+--------+------+--------+    +------------------+--------+--------+-------+ Right Renal ArteryPSV cm/sEDV cm/sComment +------------------+--------+--------+-------+ Origin              123      16           +------------------+--------+--------+-------+ Proximal            122      17           +------------------+--------+--------+-------+ Mid                  58      13           +------------------+--------+--------+-------+ Distal               59      12           +------------------+--------+--------+-------+ +-----------------+--------+--------+-------+ Left Renal ArteryPSV cm/sEDV cm/sComment +-----------------+--------+--------+-------+ Origin              69      10           +-----------------+--------+--------+-------+ Proximal            72      10           +-----------------+--------+--------+-------+ Mid                 46      9            +-----------------+--------+--------+-------+ Distal              42      10           +-----------------+--------+--------+-------+ +------------+--------+--------+----+-----------+--------+--------+----+ Right KidneyPSV cm/sEDV cm/sRI  Left KidneyPSV cm/sEDV cm/sRI   +------------+--------+--------+----+-----------+--------+--------+----+ Upper Pole  23      6       0.76Upper Pole 27      7       0.74 +------------+--------+--------+----+-----------+--------+--------+----+ Mid  29      6       0.81Mid        30      6       0.81 +------------+--------+--------+----+-----------+--------+--------+----+ Lower Pole  24                  Lower Pole 26      8       0.69 +------------+--------+--------+----+-----------+--------+--------+----+ Hilar       45      8       0.82Hilar      24      7       0.73 +------------+--------+--------+----+-----------+--------+--------+----+  +------------------+-----+------------------+-----+ Right Kidney           Left Kidney             +------------------+-----+------------------+-----+ RAR                    RAR                     +------------------+-----+------------------+-----+ RAR (manual)      1.3  RAR (manual)      0.78  +------------------+-----+------------------+-----+ Cortex                 Cortex                  +------------------+-----+------------------+-----+ Cortex thickness       Corex thickness         +------------------+-----+------------------+-----+ Kidney length (cm)11.40Kidney length (cm)10.07 +------------------+-----+------------------+-----+  Summary: Renal:  Right: No evidence of right renal artery stenosis. Abnormal right        Resistive Index. Left:  No evidence of left renal artery stenosis. Abnormal left        Resisitve Index.  *See table(s) above for measurements and observations.  Diagnosing physician: Harold Barban MD  Electronically signed by Harold Barban MD on 12/27/2019 at 5:40:10 PM.    Final    IR THORACENTESIS ASP PLEURAL SPACE W/IMG GUIDE  Result Date: 01/18/2020 INDICATION: Patient history of HF, dyspnea, and recurrent right pleural effusion. Request is made for therapeutic right thoracentesis. EXAM: ULTRASOUND GUIDED THERAPEUTIC RIGHT THORACENTESIS MEDICATIONS: 10 mL 1% lidocaine COMPLICATIONS: None immediate. PROCEDURE: An ultrasound guided thoracentesis was thoroughly discussed with the patient and questions answered. The benefits, risks, alternatives and complications were also discussed. The patient understands and wishes to proceed with the procedure. Written consent was obtained. Ultrasound was performed to localize and mark an adequate pocket of fluid in the right chest. The area was then prepped and draped in the normal sterile fashion. 1% Lidocaine was used for local anesthesia. Under ultrasound guidance a 6 Fr Safe-T-Centesis catheter was introduced.  Thoracentesis was performed. The catheter was removed and a dressing applied. FINDINGS: A total of approximately 1.75 L of hazy amber fluid was removed. IMPRESSION: Successful ultrasound guided right thoracentesis yielding 1.75 L of pleural fluid. Read by: Earley Abide, PA-C Electronically Signed   By: Ruthann Cancer MD   On: 01/18/2020 10:50   IR THORACENTESIS ASP PLEURAL SPACE W/IMG GUIDE  Result Date: 12/26/2019 INDICATION: Patient with history of CHF, now with right pleural effusion. Request is made for diagnostic and therapeutic thoracentesis. EXAM: ULTRASOUND GUIDED DIAGNOSTIC AND THERAPEUTIC RIGHT THORACENTESIS MEDICATIONS: 10 mL 1% lidocaine COMPLICATIONS: None immediate. PROCEDURE: An ultrasound guided thoracentesis was thoroughly discussed with the patient and questions answered. The benefits, risks, alternatives and complications were also discussed. The patient understands and wishes to  proceed with the procedure. Written consent was obtained. Ultrasound was performed to localize and mark an adequate pocket of fluid in the right chest. The area was then prepped and draped in the normal sterile fashion. 1% Lidocaine was used for local anesthesia. Under ultrasound guidance a 6 Fr Safe-T-Centesis catheter was introduced. Thoracentesis was performed. The catheter was removed and a dressing applied. FINDINGS: A total of approximately 700 mL of amber fluid was removed. Samples were sent to the laboratory as requested by the clinical team. IMPRESSION: Successful ultrasound guided diagnostic and therapeutic right thoracentesis yielding 700 mL of pleural fluid. Read by: Brynda Greathouse PA-C No pneumothorax on follow-up radiograph. Electronically Signed   By: Lucrezia Europe M.D.   On: 12/26/2019 15:47    Lab Data:  CBC: Recent Labs  Lab 01/17/20 1718  WBC 7.0  HGB 11.1*  HCT 37.1*  MCV 90.7  PLT 433   Basic Metabolic Panel: Recent Labs  Lab 01/17/20 1718 01/18/20 0430 01/19/20 0934  01/20/20 0536  NA 142 140 141 136  K 4.3 4.4 4.2 4.0  CL 109 107 105 101  CO2 23 25 22 27   GLUCOSE 156* 224* 158* 174*  BUN 65* 68* 64* 65*  CREATININE 3.64* 3.49* 3.60* 3.49*  CALCIUM 7.1* 6.8* 7.3* 7.3*  PHOS  --   --   --  6.2*   GFR: Estimated Creatinine Clearance: 22.2 mL/min (A) (by C-G formula based on SCr of 3.49 mg/dL (H)). Liver Function Tests: No results for input(s): AST, ALT, ALKPHOS, BILITOT, PROT, ALBUMIN in the last 168 hours. No results for input(s): LIPASE, AMYLASE in the last 168 hours. No results for input(s): AMMONIA in the last 168 hours. Coagulation Profile: No results for input(s): INR, PROTIME in the last 168 hours. Cardiac Enzymes: No results for input(s): CKTOTAL, CKMB, CKMBINDEX, TROPONINI in the last 168 hours. BNP (last 3 results) No results for input(s): PROBNP in the last 8760 hours. HbA1C: No results for input(s): HGBA1C in the last 72 hours. CBG: Recent Labs  Lab 01/19/20 1130 01/19/20 1602 01/19/20 2141 01/20/20 0626 01/20/20 1112  GLUCAP 135* 145* 154* 159* 99   Lipid Profile: No results for input(s): CHOL, HDL, LDLCALC, TRIG, CHOLHDL, LDLDIRECT in the last 72 hours. Thyroid Function Tests: No results for input(s): TSH, T4TOTAL, FREET4, T3FREE, THYROIDAB in the last 72 hours. Anemia Panel: No results for input(s): VITAMINB12, FOLATE, FERRITIN, TIBC, IRON, RETICCTPCT in the last 72 hours. Urine analysis:    Component Value Date/Time   COLORURINE YELLOW 12/28/2019 1850   APPEARANCEUR CLEAR 12/28/2019 1850   LABSPEC 1.014 12/28/2019 1850   PHURINE 5.0 12/28/2019 1850   GLUCOSEU NEGATIVE 12/28/2019 1850   HGBUR NEGATIVE 12/28/2019 1850   BILIRUBINUR NEGATIVE 12/28/2019 1850   KETONESUR NEGATIVE 12/28/2019 1850   PROTEINUR 30 (A) 12/28/2019 1850   UROBILINOGEN 0.2 06/17/2010 1620   NITRITE NEGATIVE 12/28/2019 1850   LEUKOCYTESUR NEGATIVE 12/28/2019 1850     Raphaella Larkin M.D. [facility] 01/20/2020, 2:32 PM   Call  night coverage person covering after 7pm

## 2020-01-20 NOTE — Progress Notes (Signed)
[facility] Progress Note  Name: Quartez Lagos MRN: [id number] DOB: 06/25/1950  Chief Complaint:  Shortness of breath   Subjective:  He had 2.3 liters UOP over 9/16 charted.  He feels better today from a breathing standpoint but is tired.     Review of systems:    Short of breath but better  Denies n/v Denies chest pain   --------- Background on consult:  Masaki Rothbauer is a 69 y.o. male with a history of diabetes, hypertension, CKD stage III/IV, and chronic diastolic CHF who presented to the hospital with shortness of breath.  Feels like he carries his fluid in his lungs and his abdomen - his belly feels tight.  He was found to be hypoxic - sats 80's at recent appt.  He had a thoracentesis today.  He and his wife are worried about his kidney function.  Note that he was recently hospitalized with hypoxia and acute on chronic CHF.  He was aggressively diuresed and ultimately had AKI that admission.  Nephrology was consulted at that time and creatinine was 2.91 on admission and peak of 4.34 on 8/28 (the date of discharge).  At that time per charting he advocated to be able to go home that day and his diuretics were held on discharge.  I saw him later that week on 9/2 and restarted his torsemide initially at 40 mg daily then decreased to 20 mg daily - his prior regimen.  Note that his wife coordinates his care and medications.  He is conversant but is not well versed in his medications.  He required a thoracentesis last admission.  Recent admission with pneumonia this summer as well.  Other imaging includes nuclear medicine scan not consistent with amyloid.  His more distant history is notable for evaluation in the ER for AMS in 03/2017 where he was found to be cocaine positive.   We have discussed his kidney failure and high risk of progression at his past appointments.  I again discussed with him today about the risk of progression and anticipated eventual need for dialysis and  he states that he would want dialysis.  He is also interested in pursuing a transplant and with his recent decline in kidney function a referral is appropriate once more clinically stable though age may be limiting factor at many centers if he does not have a living donor.  We discussed this.  Recent labs: 01/05/20 - Cr 3.67, BUN 85, eGFR 18, Hb 10.6 01/03/20 - Cr 3.88, BUN 88, eGFR 17, K 4.5, bicarb 26, calcium 7.3, alb 3.9, phos 5.1 12/31/19 - Cr 4.34, BUN 79 (on discharge); Hb 11.2 on 12/23/19; A1c 6.6 this admit 12/05/19 - Cr 2.46, BUN 41, eGFR 30, K 4.5, bicarb 21, Ca 7.6, alb 3.9, phos 4.9, Hb 10.8, PTH 101; up/cr ratio 1766 mg/g 10/28/19 - 11/06/19 hospitalization. On 10/28/19 Cr 1.76 on admit to [facility]; Cr appears 2.7 - 2.9 on labs provided to me and was Cr 2.94 on discharge. Hb 10.1 on 11/05/19   Intake/Output Summary (Last 24 hours) at 01/20/2020 1603 Last data filed at 01/20/2020 1305 Gross per 24 hour  Intake 530 ml  Output 3350 ml  Net -2820 ml    Vitals:  Vitals:   01/20/20 0553 01/20/20 0850 01/20/20 1251 01/20/20 1452  BP: 136/68  127/67   Pulse: 67  69   Resp: $Remo'17 18 18 18  'pfAfh$ Temp: 98.4 F (36.9 C)  99.1 F (37.3 C)   TempSrc: Oral  Oral  SpO2: 98%  93%   Weight: 89.4 kg     Height:         Physical Exam:  General adult male in bed in no acute distress  HEENT normocephalic atraumatic extraocular movements intact sclera anicteric Neck supple trachea midline Lungs basilar crackles and reduced sounds right base Heart regular rate and rhythm no rubs  Abdomen softer nontender distended Extremities no edema  Psych normal mood and affect Neuro - alert and oriented x 3 provides hx and follows commands   Medications reviewed   Labs:  BMP Latest Ref Rng & Units 01/20/2020 01/19/2020 01/18/2020  Glucose 70 - 99 mg/dL 174(H) 158(H) 224(H)  BUN 8 - 23 mg/dL 65(H) 64(H) 68(H)  Creatinine 0.61 - 1.24 mg/dL 3.49(H) 3.60(H) 3.49(H)  BUN/Creat Ratio 10 - 24 - - -  Sodium 135 - 145  mmol/L 136 141 140  Potassium 3.5 - 5.1 mmol/L 4.0 4.2 4.4  Chloride 98 - 111 mmol/L 101 105 107  CO2 22 - 32 mmol/L $RemoveB'27 22 25  'dCFWNYgI$ Calcium 8.9 - 10.3 mg/dL 7.3(L) 7.3(L) 6.8(L)     Assessment/Plan:   # AKI - Secondary to pre-renal insults with diuresis in setting of acute hypoxic resp failure due to both acute CHF and recovering from PNA - This new higher apparent baseline may also simply represent progression of his CKD    # Acute hypoxic resp failure  - s/p thoracentesis  - diruesis as below  - on supplemental oxygen   # Acute on Chronic diastolic CHF - Redose metolazone 5 mg PO once now before next lasix this evening.  Continue lasix 80 mg IV BID for now. (S/p metolazone once on 9/15)  - Hopeful for transition to PO in the next 1-2 days.  We will assess for when appropriate to attempt transition to PO; torsemide 40 mg BID with metolazone 5 mg twice a week PRN may be a good option - strict ins and outs   # CKD stage IV - Secondary to diabetic nephropathy as well as microvascular disease from HTN. Note also ongoing pre-renal insults with some prior NSAID use. Serologic work-up negative to date and no signs of plasma cell dyscrasia.  His insight into his kidney disease has been impaired despite multiple prior discussions and this is improving  - with likely recent progression with multiple AKI events  - Does want HD when he progresses to that point and we have discussed high risk of progression at past visits.  We have discussed that given CKD progression we can discuss transplant referral further at his outpatient follow-up with me     # HTN with CKD - Continue current regimen with diuretics as above  # Secondary hyperparathyroidism and metabolic bone disease - calcitriol 0.25 mcg three times a week  - two 500 mg tablets nightly  - calcium gluconate once IV now - renal panel in AM will assess albumin   # Anemia secondary to CKD - no acute indication for PRBC's  #  Recurrent right sided pleural effusion  - s/p thoracentesis - diuretics as above - cytology sent on previous thoracentesis with reactive mesothelial cells present, Numerous lymphoid cells   Claudia Desanctis, MD 01/20/2020 4:39 PM

## 2020-01-20 NOTE — Progress Notes (Signed)
SATURATION QUALIFICATIONS: (This note is used to comply with regulatory documentation for home oxygen)  Patient Saturations on Room Air at Rest = 91%  Patient Saturations on Room Air while Ambulating = 86%  Patient Saturations on 2 Liters of oxygen while Ambulating = 91%  Please briefly explain why patient needs home oxygen: Upon exertion patient desats and requires oxygen to maintain a certain level of oxygen saturation.

## 2020-01-21 DIAGNOSIS — I5031 Acute diastolic (congestive) heart failure: Secondary | ICD-10-CM

## 2020-01-21 LAB — RENAL FUNCTION PANEL
Albumin: 3.3 g/dL — ABNORMAL LOW (ref 3.5–5.0)
Anion gap: 13 (ref 5–15)
BUN: 66 mg/dL — ABNORMAL HIGH (ref 8–23)
CO2: 27 mmol/L (ref 22–32)
Calcium: 8 mg/dL — ABNORMAL LOW (ref 8.9–10.3)
Chloride: 99 mmol/L (ref 98–111)
Creatinine, Ser: 3.53 mg/dL — ABNORMAL HIGH (ref 0.61–1.24)
GFR calc Af Amer: 19 mL/min — ABNORMAL LOW (ref >60)
GFR calc non Af Amer: 17 mL/min — ABNORMAL LOW (ref >60)
Glucose, Bld: 231 mg/dL — ABNORMAL HIGH (ref 70–99)
Phosphorus: 6.1 mg/dL — ABNORMAL HIGH (ref 2.5–4.6)
Potassium: 4.5 mmol/L (ref 3.5–5.1)
Sodium: 139 mmol/L (ref 135–145)

## 2020-01-21 LAB — GLUCOSE, CAPILLARY
Glucose-Capillary: 202 mg/dL — ABNORMAL HIGH (ref 70–99)
Glucose-Capillary: 164 mg/dL — ABNORMAL HIGH (ref 70–99)
Glucose-Capillary: 117 mg/dL — ABNORMAL HIGH (ref 70–99)
Glucose-Capillary: 155 mg/dL — ABNORMAL HIGH (ref 70–99)

## 2020-01-21 MED ORDER — IPRATROPIUM-ALBUTEROL 0.5-2.5 (3) MG/3ML IN SOLN
3.0000 mL | Freq: Two times a day (BID) | RESPIRATORY_TRACT | Status: DC
  Administered 2020-01-22 – 2020-01-23 (×3): 3 mL via RESPIRATORY_TRACT
  Filled 2020-01-21 (×4): qty 3

## 2020-01-21 MED ORDER — TORSEMIDE 20 MG PO TABS
40.0000 mg | ORAL_TABLET | Freq: Every day | ORAL | Status: DC
  Administered 2020-01-22: 40 mg via ORAL
  Filled 2020-01-21: qty 2

## 2020-01-21 NOTE — Progress Notes (Signed)
[facility] Progress Note  Name: Melvin Mcclure MRN: [id number] DOB: 26-Sep-1950  Chief Complaint:  Shortness of breath   Subjective:  UOP 2.9L yesterday.   He feels better today from a breathing standpoint but is tired.      Review of systems:    Short of breath but better  Denies n/v Denies chest pain   --------- Background on consult:  Melvin Mcclure is a 69 y.o. male with a history of diabetes, hypertension, CKD stage III/IV, and chronic diastolic CHF who presented to the hospital with shortness of breath.  Feels like he carries his fluid in his lungs and his abdomen - his belly feels tight.  He was found to be hypoxic - sats 80's at recent appt.  He had a thoracentesis today.  He and his wife are worried about his kidney function.  Note that he was recently hospitalized with hypoxia and acute on chronic CHF.  He was aggressively diuresed and ultimately had AKI that admission.  Nephrology was consulted at that time and creatinine was 2.91 on admission and peak of 4.34 on 8/28 (the date of discharge).  At that time per charting he advocated to be able to go home that day and his diuretics were held on discharge.  I saw him later that week on 9/2 and restarted his torsemide initially at 40 mg daily then decreased to 20 mg daily - his prior regimen.  Note that his wife coordinates his care and medications.  He is conversant but is not well versed in his medications.  He required a thoracentesis last admission.  Recent admission with pneumonia this summer as well.  Other imaging includes nuclear medicine scan not consistent with amyloid.  His more distant history is notable for evaluation in the ER for AMS in 03/2017 where he was found to be cocaine positive.   We have discussed his kidney failure and high risk of progression at his past appointments.  I again discussed with him today about the risk of progression and anticipated eventual need for dialysis and he states that he  would want dialysis.  He is also interested in pursuing a transplant and with his recent decline in kidney function a referral is appropriate once more clinically stable though age may be limiting factor at many centers if he does not have a living donor.  We discussed this.  Recent labs: 01/05/20 - Cr 3.67, BUN 85, eGFR 18, Hb 10.6 01/03/20 - Cr 3.88, BUN 88, eGFR 17, K 4.5, bicarb 26, calcium 7.3, alb 3.9, phos 5.1 12/31/19 - Cr 4.34, BUN 79 (on discharge); Hb 11.2 on 12/23/19; A1c 6.6 this admit 12/05/19 - Cr 2.46, BUN 41, eGFR 30, K 4.5, bicarb 21, Ca 7.6, alb 3.9, phos 4.9, Hb 10.8, PTH 101; up/cr ratio 1766 mg/g 10/28/19 - 11/06/19 hospitalization. On 10/28/19 Cr 1.76 on admit to [facility]; Cr appears 2.7 - 2.9 on labs provided to me and was Cr 2.94 on discharge. Hb 10.1 on 11/05/19   Intake/Output Summary (Last 24 hours) at 01/21/2020 1024 Last data filed at 01/21/2020 0300 Gross per 24 hour  Intake 373.69 ml  Output 2150 ml  Net -1776.31 ml    Vitals:  Vitals:   01/21/20 0044 01/21/20 0646 01/21/20 0731 01/21/20 0900  BP: (!) 148/71 138/74  (!) 147/75  Pulse: 79 69 69   Resp: _0 Temp: 99.1 F (37.3 C) 98.6 F (37 C)    TempSrc: Oral Oral  SpO2: (!) 87% 97% 96%   Weight:  86.4 kg    Height:         Physical Exam:  General adult male in bed in no acute distress  HEENT normocephalic atraumatic extraocular movements intact sclera anicteric Neck supple trachea midline Lungs basilar crackles and reduced sounds right base Heart regular rate and rhythm no rubs  Abdomen softer nontender distended Extremities no edema  Psych normal mood and affect Neuro - alert and oriented x 3 provides hx and follows commands   Medications reviewed   Labs:  BMP Latest Ref Rng & Units 01/21/2020 01/20/2020 01/19/2020  Glucose 70 - 99 mg/dL 231(H) 174(H) 158(H)  BUN 8 - 23 mg/dL 66(H) 65(H) 64(H)  Creatinine 0.61 - 1.24 mg/dL 3.53(H) 3.49(H) 3.60(H)  BUN/Creat Ratio 10 - 24 - - -  Sodium 135 -  145 mmol/L 139 136 141  Potassium 3.5 - 5.1 mmol/L 4.5 4.0 4.2  Chloride 98 - 111 mmol/L 99 101 105  CO2 22 - 32 mmol/L _0 Calcium 8.9 - 10.3 mg/dL 8.0(L) 7.3(L) 7.3(L)     Assessment/Plan:   # AKI - Secondary to pre-renal insults with diuresis in setting of acute hypoxic resp failure due to both acute CHF and recovering from PNA - This new higher apparent baseline may also simply represent progression of his CKD    # Acute hypoxic resp failure  - s/p thoracentesis  - diruesis as below  - on supplemental oxygen   # Acute on Chronic diastolic CHF -  Continue lasix 80 mg IV BID for today and start torsemide 40 BID tomorrow AM with plans for metolazone 5 mg twice a week PRN - strict ins and outs   # CKD stage IV - Secondary to diabetic nephropathy as well as microvascular disease from HTN. Note also ongoing pre-renal insults with some prior NSAID use. Serologic work-up negative to date and no signs of plasma cell dyscrasia.  His insight into his kidney disease has been impaired despite multiple prior discussions.  His kidney function has stabilized currently with Cr ~3.5 - with likely recent progression with multiple AKI events  - Does want HD when he progresses to that point and we have discussed high risk of progression at past visits.  We have discussed that given CKD progression we can discuss transplant referral further at his outpatient follow-up with me     # HTN with CKD - Continue current regimen with diuretics as above  # Secondary hyperparathyroidism and metabolic bone disease - calcitriol 0.25 mcg three times a week  - low phos diet - phos 6.1.  #Malnutrition: albumin 3.3 - encourage protein intake  # Anemia secondary to CKD - no acute indication for PRBC's  # Recurrent right sided pleural effusion  - s/p thoracentesis - diuretics as above - cytology sent on previous thoracentesis with reactive mesothelial cells present, Numerous lymphoid cells    Dispo:  I think he's getting close to discharge but would like to trial off O2 prior to discharge -- if he can't be weaned we should check another CXR.  Justin Mend, MD 01/21/2020 10:24 AM

## 2020-01-21 NOTE — Progress Notes (Signed)
PROGRESS NOTE    Melvin Mcclure  EHU:[id number] DOB: 03/02/1951 DOA: 01/17/2020 PCP: Nolene Ebbs, MD    Brief Narrative:  Melvin Mcclure is a 69 year old male with past medical history notable for CKD stage IV, diastolic congestive heart failure, essential hypertension, type 2 diabetes mellitus with recurrent hospitalizations who was sent in from his cardiologist office for shortness of breath after being found to have recurrent right pleural effusion with associated hypoxia.  In the ED, SPO2 was noted to be in the 80s on room air, and in the mid 90s on 2 L nasal cannula.  Nephrology and cardiology were consulted.  TRH consulted for admission.  9/17: Patient continues with shortness of breath, improved but still requiring supplemental oxygen.  Continues receive furosemide 80 mg IV twice daily with occasional metolazone doses, received 5 mg on 9/15 and 5 mg 9/17.   Assessment & Plan:   Principal Problem:   Acute respiratory failure with hypoxia (HCC) Active Problems:   Acute diastolic CHF (congestive heart failure) (HCC)   DM2 (diabetes mellitus, type 2) (HCC)   CKD stage 4 due to type 2 diabetes mellitus (HCC)   Hypertensive cardiomyopathy, with heart failure (HCC)   Recurrent right pleural effusion   HTN (hypertension)   Acute hypoxic respiratory failure, present on admission Recurrent right pleural effusion Chronic diastolic congestive heart failure, decompensated Patient presenting from cardiology office with shortness of breath and found to be hypoxic with SPO2 in the low 80s on room air.  Patient was noted to have a recurrent right pleural effusion and underwent thoracentesis on 01/18/2020 with 1.75 L removed; pleural fluid studies consistent with transudate of effusion.  Volume overload is complicated by his diastolic heart dysfunction as well as his severe kidney disease. --Nephrology and cardiology following, appreciate assistance --net negative 2.4L past 24h and net negative  6L since admission --wt 90.7>>89.4kg --Continue furosemide 80 mg IV BID, with metolazone prn per Cards/Neph --Strict I's and O's and daily weights --Continue monitor renal function closely while on IV diuresis  Acute renal failure on CKD stage IV --Nephrology following as above Bridge assistance, follows outpatient with Dr. Royce Macadamia --Cr 3.6>3.49>3.53 --Continue monitor urinary output closely --Avoid nephrotoxins, renally dose all medications --Follow BMP daily  Type 2 diabetes mellitus Hemoglobin A1c 6.6 on 12/29/2019, well controlled.  Diet controlled at home. --Insulin sliding scale for coverage while inpatient --CBGs qAC/HS  Essential hypertension BP 148/71 this morning.  --Amlodipine 10 mg p.o. daily --Carvedilol 12.5 mg p.o. twice daily --Hydralazine 50 mg p.o. every 8 hours --Aspirin 81 mg p.o. daily, not on statin at home   DVT prophylaxis: Heparin Code Status: Full code Family Communication: Discussed with patient extensively at bedside  Disposition Plan:  Status is: Inpatient  Remains inpatient appropriate because:Unsafe d/c plan, IV treatments appropriate due to intensity of illness or inability to take PO and Inpatient level of care appropriate due to severity of illness   Dispo: The patient is from: Home              Anticipated d/c is to: Home              Anticipated d/c date is: 2 days              Patient currently is not medically stable to d/c.    Consultants:   Cardiology  Nephrology  Procedures:   IR thoracentesis 01/18/2020  Antimicrobials:   None   Subjective: Patient seen and examined at bedside, resting comfortably.  Feels very  weak and fatigued.  Reports good urine output.  Shortness of breath improved but not back at baseline and still requiring supplemental oxygen.  No other complaints or concerns at this time.  Denies headache, no dizziness, no chest pain, palpitations, no abdominal pain, no fever/chills/night sweats, no  nausea/vomiting/diarrhea.  No acute events overnight per nursing staff.  Objective: Vitals:   01/21/20 0044 01/21/20 0646 01/21/20 0731 01/21/20 0900  BP: (!) 148/71 138/74  (!) 147/75  Pulse: 79 69 69   Resp: 16 16 16    Temp: 99.1 F (37.3 C) 98.6 F (37 C)    TempSrc: Oral Oral    SpO2: (!) 87% 97% 96%   Weight:  86.4 kg    Height:        Intake/Output Summary (Last 24 hours) at 01/21/2020 0928 Last data filed at 01/21/2020 0300 Gross per 24 hour  Intake 573.69 ml  Output 2950 ml  Net -2376.31 ml   Filed Weights   01/19/20 0605 01/20/20 0553 01/21/20 0646  Weight: 91.1 kg 89.4 kg 86.4 kg    Examination:  General exam: Appears calm and comfortable  Respiratory system: Decreased breath sounds bilateral bases with mild crackles, no wheezing, normal respiratory effort without accessory muscle use.  On 3 L nasal cannula with SPO2 93% Cardiovascular system: S1 & S2 heard, RRR. No JVD, murmurs, rubs, gallops or clicks. No pedal edema. Gastrointestinal system: Abdomen is nondistended, soft and nontender. No organomegaly or masses felt. Normal bowel sounds heard. Central nervous system: Alert and oriented. No focal neurological deficits. Extremities: Symmetric 5 x 5 power. Skin: No rashes, lesions or ulcers Psychiatry: Judgement and insight appear poor.  Depressed mood & flat affect.     Data Reviewed: I have personally reviewed following labs and imaging studies  CBC: Recent Labs  Lab 01/17/20 1718  WBC 7.0  HGB 11.1*  HCT 37.1*  MCV 90.7  PLT 466   Basic Metabolic Panel: Recent Labs  Lab 01/17/20 1718 01/18/20 0430 01/19/20 0934 01/20/20 0536 01/21/20 0326  NA 142 140 141 136 139  K 4.3 4.4 4.2 4.0 4.5  CL 109 107 105 101 99  CO2 23 25 22 27 27   GLUCOSE 156* 224* 158* 174* 231*  BUN 65* 68* 64* 65* 66*  CREATININE 3.64* 3.49* 3.60* 3.49* 3.53*  CALCIUM 7.1* 6.8* 7.3* 7.3* 8.0*  PHOS  --   --   --  6.2* 6.1*   GFR: Estimated Creatinine Clearance: 20.1  mL/min (A) (by C-G formula based on SCr of 3.53 mg/dL (H)). Liver Function Tests: Recent Labs  Lab 01/21/20 0326  ALBUMIN 3.3*   No results for input(s): LIPASE, AMYLASE in the last 168 hours. No results for input(s): AMMONIA in the last 168 hours. Coagulation Profile: No results for input(s): INR, PROTIME in the last 168 hours. Cardiac Enzymes: No results for input(s): CKTOTAL, CKMB, CKMBINDEX, TROPONINI in the last 168 hours. BNP (last 3 results) No results for input(s): PROBNP in the last 8760 hours. HbA1C: No results for input(s): HGBA1C in the last 72 hours. CBG: Recent Labs  Lab 01/20/20 0626 01/20/20 1112 01/20/20 1600 01/20/20 2202 01/21/20 0646  GLUCAP 159* 99 220* 110* 202*   Lipid Profile: No results for input(s): CHOL, HDL, LDLCALC, TRIG, CHOLHDL, LDLDIRECT in the last 72 hours. Thyroid Function Tests: No results for input(s): TSH, T4TOTAL, FREET4, T3FREE, THYROIDAB in the last 72 hours. Anemia Panel: No results for input(s): VITAMINB12, FOLATE, FERRITIN, TIBC, IRON, RETICCTPCT in the last 72 hours. Sepsis  Labs: No results for input(s): PROCALCITON, LATICACIDVEN in the last 168 hours.  Recent Results (from the past 240 hour(s))  Resp Panel by RT PCR (RSV, Flu A&B, Covid) - Nasopharyngeal Swab     Status: None   Collection Time: 01/17/20  9:19 PM   Specimen: Nasopharyngeal Swab  Result Value Ref Range Status   SARS Coronavirus 2 by RT PCR NEGATIVE NEGATIVE Final    Comment: (NOTE) SARS-CoV-2 target nucleic acids are NOT DETECTED.  The SARS-CoV-2 RNA is generally detectable in upper respiratoy specimens during the acute phase of infection. The lowest concentration of SARS-CoV-2 viral copies this assay can detect is 131 copies/mL. A negative result does not preclude SARS-Cov-2 infection and should not be used as the sole basis for treatment or other patient management decisions. A negative result may occur with  improper specimen collection/handling,  submission of specimen other than nasopharyngeal swab, presence of viral mutation(s) within the areas targeted by this assay, and inadequate number of viral copies (<131 copies/mL). A negative result must be combined with clinical observations, patient history, and epidemiological information. The expected result is Negative.  Fact Sheet for Patients:  PinkCheek.be  Fact Sheet for Healthcare Providers:  GravelBags.it  This test is no t yet approved or cleared by the [place] FDA and  has been authorized for detection and/or diagnosis of SARS-CoV-2 by FDA under an Emergency Use Authorization (EUA). This EUA will remain  in effect (meaning this test can be used) for the duration of the COVID-19 declaration under Section 564(b)(1) of the Act, 21 U.S.C. section 360bbb-3(b)(1), unless the authorization is terminated or revoked sooner.     Influenza A by PCR NEGATIVE NEGATIVE Final   Influenza B by PCR NEGATIVE NEGATIVE Final    Comment: (NOTE) The Xpert Xpress SARS-CoV-2/FLU/RSV assay is intended as an aid in  the diagnosis of influenza from Nasopharyngeal swab specimens and  should not be used as a sole basis for treatment. Nasal washings and  aspirates are unacceptable for Xpert Xpress SARS-CoV-2/FLU/RSV  testing.  Fact Sheet for Patients: PinkCheek.be  Fact Sheet for Healthcare Providers: GravelBags.it  This test is not yet approved or cleared by the [place] FDA and  has been authorized for detection and/or diagnosis of SARS-CoV-2 by  FDA under an Emergency Use Authorization (EUA). This EUA will remain  in effect (meaning this test can be used) for the duration of the  Covid-19 declaration under Section 564(b)(1) of the Act, 21  U.S.C. section 360bbb-3(b)(1), unless the authorization is  terminated or revoked.    Respiratory Syncytial Virus by PCR  NEGATIVE NEGATIVE Final    Comment: (NOTE) Fact Sheet for Patients: PinkCheek.be  Fact Sheet for Healthcare Providers: GravelBags.it  This test is not yet approved or cleared by the [place] FDA and  has been authorized for detection and/or diagnosis of SARS-CoV-2 by  FDA under an Emergency Use Authorization (EUA). This EUA will remain  in effect (meaning this test can be used) for the duration of the  COVID-19 declaration under Section 564(b)(1) of the Act, 21 U.S.C.  section 360bbb-3(b)(1), unless the authorization is terminated or  revoked. Performed at [facility], [place] [address], [place], [place] [address]          Radiology Studies: No results found.      Scheduled Meds: . amLODipine  10 mg Oral Daily  . aspirin  81 mg Oral Daily  . calcitRIOL  0.25 mcg Oral Once per day on Mon  Wed Fri  . calcium carbonate  2 tablet Oral Daily  . carvedilol  12.5 mg Oral BID WC  . ferrous sulfate  325 mg Oral Q breakfast  . furosemide  80 mg Intravenous BID  . gabapentin  300 mg Oral BID  . heparin  5,000 Units Subcutaneous Q8H  . hydrALAZINE  50 mg Oral TID  . insulin aspart  0-15 Units Subcutaneous TID WC  . insulin aspart  0-5 Units Subcutaneous QHS  . ipratropium-albuterol  3 mL Nebulization TID  . isosorbide mononitrate  60 mg Oral QPM  . meclizine  25 mg Oral TID  . sodium chloride flush  3 mL Intravenous Q12H   Continuous Infusions: . sodium chloride       LOS: 3 days    Time spent: 36 minutes spent on chart review, discussion with nursing staff, consultants, updating family and interview/physical exam; more than 50% of that time was spent in counseling and/or coordination of care.    Ettie Krontz J [place] DO [facility] Available via Epic secure chat 7am-7pm After these hours, please refer to coverage provider listed on [email or web address] 01/21/2020, 9:28 AM

## 2020-01-21 NOTE — Progress Notes (Signed)
Progress Note  Patient Name: Melvin Mcclure Date of Encounter: 01/21/2020  [facility] Cardiologist: Kirk Ruths, MD   Subjective   Sleepy, no complaints  Inpatient Medications    Scheduled Meds: . amLODipine  10 mg Oral Daily  . aspirin  81 mg Oral Daily  . calcitRIOL  0.25 mcg Oral Once per day on Mon Wed Fri  . calcium carbonate  2 tablet Oral Daily  . carvedilol  12.5 mg Oral BID WC  . ferrous sulfate  325 mg Oral Q breakfast  . furosemide  80 mg Intravenous BID  . gabapentin  300 mg Oral BID  . heparin  5,000 Units Subcutaneous Q8H  . hydrALAZINE  50 mg Oral TID  . insulin aspart  0-15 Units Subcutaneous TID WC  . insulin aspart  0-5 Units Subcutaneous QHS  . ipratropium-albuterol  3 mL Nebulization TID  . isosorbide mononitrate  60 mg Oral QPM  . meclizine  25 mg Oral TID  . sodium chloride flush  3 mL Intravenous Q12H   Continuous Infusions: . sodium chloride     PRN Meds: sodium chloride, acetaminophen, albuterol, lidocaine (PF), ondansetron (ZOFRAN) IV, senna-docusate, sodium chloride flush   Vital Signs    Vitals:   01/21/20 0044 01/21/20 0646 01/21/20 0731 01/21/20 0900  BP: (!) 148/71 138/74  (!) 147/75  Pulse: 79 69 69   Resp: 16 16 16    Temp: 99.1 F (37.3 C) 98.6 F (37 C)    TempSrc: Oral Oral    SpO2: (!) 87% 97% 96%   Weight:  86.4 kg    Height:        Intake/Output Summary (Last 24 hours) at 01/21/2020 0924 Last data filed at 01/21/2020 0300 Gross per 24 hour  Intake 573.69 ml  Output 2950 ml  Net -2376.31 ml   Last 3 Weights 01/21/2020 01/20/2020 01/19/2020  Weight (lbs) 190 lb 7.6 oz 197 lb 1.6 oz 200 lb 14.4 oz  Weight (kg) 86.4 kg 89.404 kg 91.128 kg      Telemetry    Sinus rhythm- Personally Reviewed  ECG    No new- Personally Reviewed  Physical Exam   GEN: No acute distress.   Neck: No JVD Cardiac: RRR, no murmurs, rubs, or gallops.  Respiratory: Clear to auscultation bilaterally. GI: Soft, nontender,  non-distended  MS: No edema; No deformity. Neuro:  Nonfocal  Psych: Normal affect   Labs    High Sensitivity Troponin:   Recent Labs  Lab 12/23/19 1059 12/25/19 0456 12/25/19 0739 01/17/20 1718 01/17/20 1935  TROPONINIHS 11 13 12 10 11       Chemistry Recent Labs  Lab 01/19/20 0934 01/20/20 0536 01/21/20 0326  NA 141 136 139  K 4.2 4.0 4.5  CL 105 101 99  CO2 22 27 27   GLUCOSE 158* 174* 231*  BUN 64* 65* 66*  CREATININE 3.60* 3.49* 3.53*  CALCIUM 7.3* 7.3* 8.0*  ALBUMIN  --   --  3.3*  GFRNONAA 16* 17* 17*  GFRAA 19* 19* 19*  ANIONGAP 14 8 13      Hematology Recent Labs  Lab 01/17/20 1718  WBC 7.0  RBC 4.09*  HGB 11.1*  HCT 37.1*  MCV 90.7  MCH 27.1  MCHC 29.9*  RDW 16.9*  PLT 271    BNPNo results for input(s): BNP, PROBNP in the last 168 hours.   DDimer No results for input(s): DDIMER in the last 168 hours.   Radiology    No results found.  Cardiac  Studies   EF 60%  Patient Profile     69 y.o. male with acute kidney injury, acute on chronic diastolic heart failure  Assessment & Plan    Acute kidney injury/chronic kidney disease stage IV -Appreciate nephrology's assistance.  Dr. Reeves Dam note reviewed once again.  She redosed metolazone 5 mg yesterday evening.  Continue with IV Lasix 80 mg twice daily.  IV.  Chronic diastolic heart failure -Treatment as above.  Right-sided pleural effusion -Thoracentesis in ER on admission.      For questions or updates, please contact [place] Please consult [email or web address] for contact info under        Signed, Candee Furbish, MD  01/21/2020, 9:24 AM

## 2020-01-22 LAB — RENAL FUNCTION PANEL
Albumin: 3.1 g/dL — ABNORMAL LOW (ref 3.5–5.0)
Anion gap: 11 (ref 5–15)
BUN: 72 mg/dL — ABNORMAL HIGH (ref 8–23)
CO2: 30 mmol/L (ref 22–32)
Calcium: 7.7 mg/dL — ABNORMAL LOW (ref 8.9–10.3)
Chloride: 96 mmol/L — ABNORMAL LOW (ref 98–111)
Creatinine, Ser: 3.53 mg/dL — ABNORMAL HIGH (ref 0.61–1.24)
GFR calc Af Amer: 19 mL/min — ABNORMAL LOW (ref >60)
GFR calc non Af Amer: 17 mL/min — ABNORMAL LOW (ref >60)
Glucose, Bld: 197 mg/dL — ABNORMAL HIGH (ref 70–99)
Phosphorus: 6.5 mg/dL — ABNORMAL HIGH (ref 2.5–4.6)
Potassium: 4.3 mmol/L (ref 3.5–5.1)
Sodium: 137 mmol/L (ref 135–145)

## 2020-01-22 LAB — GLUCOSE, CAPILLARY
Glucose-Capillary: 220 mg/dL — ABNORMAL HIGH (ref 70–99)
Glucose-Capillary: 97 mg/dL (ref 70–99)
Glucose-Capillary: 239 mg/dL — ABNORMAL HIGH (ref 70–99)
Glucose-Capillary: 147 mg/dL — ABNORMAL HIGH (ref 70–99)

## 2020-01-22 MED ORDER — TORSEMIDE 20 MG PO TABS
40.0000 mg | ORAL_TABLET | Freq: Two times a day (BID) | ORAL | Status: DC
  Administered 2020-01-22 – 2020-01-23 (×2): 40 mg via ORAL
  Filled 2020-01-22 (×2): qty 2

## 2020-01-22 NOTE — Progress Notes (Signed)
Physical Therapy Treatment Patient Details Name: Melvin Mcclure MRN: [id number] DOB: 27-Apr-1951 Today's Date: 01/22/2020    History of Present Illness Patient is a 69 y/o male who was sent from his Cardiology office due to SOB and hypoxia. CXR- right pleural effusion. Admitted with acute respiratory failure with hypoxia due to CHF and pleural effusion. s/p thoracentesis 9/15. PMH includes HTN, DM, CKD, vertigo, recent hospitalization for CHF and CAP (6/25-7/4).    PT Comments    Patient presents with generalized weakness, dyspnea on exertion, impaired balance and decreased activity tolerance s/p above. Pt lives at home with wife who is an Therapist, sports and reports being independent for ADLs/IADLs PTA. Today, pt requires Min guard-Min A for transfers and gait training with pt reaching to hold onto rail for support at times. Also noted to have some staggering requiring 2 standing rest breaks. Sp02 dropped to 85% on RA during ambulation; able to maintain Sp02 >93% on 3L/min 02 [place]. Will need supplemental 02 for home. LIkely would benefit from using RW vs SPC for balance and safety. Discussed with pt. Will follow.    Follow Up Recommendations  No PT follow up     Equipment Recommendations  Other (comment) (likely needs RW vs SPC)    Recommendations for Other Services       Precautions / Restrictions Precautions Precautions: Fall Precaution Comments: watch 02 Restrictions Weight Bearing Restrictions: No    Mobility  Bed Mobility Overal bed mobility: Modified Independent             General bed mobility comments: Use of rail.  Transfers Overall transfer level: Needs assistance Equipment used: None Transfers: Sit to/from Stand Sit to Stand: Min guard         General transfer comment: Min guard for safety. Stood from [employer], from toilet x1.  Ambulation/Gait Ambulation/Gait assistance: Min assist;Min guard Gait Distance (Feet): 100 Feet Assistive device: None (rail at times) Gait  Pattern/deviations: Step-through pattern;Decreased dorsiflexion - left;Decreased dorsiflexion - right;Trunk flexed;Staggering left;Staggering right Gait velocity: decreased Gait velocity interpretation: <1.31 ft/sec, indicative of household ambulator General Gait Details: SLow, unsteady gait with staggering in borh directions reaching for rail for support; Sp02 dropped to 85% on RA, able to maintain 02 >93% on 3L/min 02 [place]. 2 standing rest breaks. KNee instability noted.   Stairs             Wheelchair Mobility    Modified Rankin (Stroke Patients Only)       Balance Overall balance assessment: Needs assistance Sitting-balance support: Feet supported;No upper extremity supported Sitting balance-Leahy Scale: Good Sitting balance - Comments: supervision for safety   Standing balance support: During functional activity Standing balance-Leahy Scale: Fair Standing balance comment: Close Min guard-Min A for dynamic balance due to unsteadiness. Hx of vertigo                            Cognition Arousal/Alertness: Awake/alert Behavior During Therapy: WFL for tasks assessed/performed Overall Cognitive Status: Within Functional Limits for tasks assessed                                 General Comments: Repeating questions at times, likely related to difficulty hearing.      Exercises      General Comments General comments (skin integrity, edema, etc.): Pt still dealing with vertigo      Pertinent Vitals/Pain Pain Assessment: No/denies  pain    Home Living Family/patient expects to be discharged to:: Private residence Living Arrangements: Spouse/significant other Available Help at Discharge: Available 24 hours/day Type of Home: House Home Access: Level entry   Home Layout: Two level Home Equipment: None      Prior Function Level of Independence: Independent      Comments: Independent with ADLs/IADLs, drives.   PT Goals (current goals can  now be found in the care plan section) Acute Rehab PT Goals Patient Stated Goal: return home and get stronger PT Goal Formulation: With patient Time For Goal Achievement: 02/05/20 Potential to Achieve Goals: Good    Frequency    Min 3X/week      PT Plan Current plan remains appropriate    Co-evaluation              AM-PAC PT "6 Clicks" Mobility   Outcome Measure  Help needed turning from your back to your side while in a flat bed without using bedrails?: None Help needed moving from lying on your back to sitting on the side of a flat bed without using bedrails?: None Help needed moving to and from a bed to a chair (including a wheelchair)?: A Little Help needed standing up from a chair using your arms (e.g., wheelchair or bedside chair)?: A Little Help needed to walk in hospital room?: A Little Help needed climbing 3-5 steps with a railing? : A Little 6 Click Score: 20    End of Session Equipment Utilized During Treatment: Oxygen;Gait belt Activity Tolerance: Treatment limited secondary to medical complications (Comment) (drop in Sp02) Patient left: in bed;with call bell/phone within reach;with bed alarm set Nurse Communication: Mobility status;Other (comment) PT Visit Diagnosis: Difficulty in walking, not elsewhere classified (R26.2);Unsteadiness on feet (R26.81);Muscle weakness (generalized) (M62.81)     Time: 2518-9842 PT Time Calculation (min) (ACUTE ONLY): 31 min  Charges:  $Gait Training: 8-22 mins $Therapeutic Exercise: 8-22 mins                     Marisa Severin, PT, DPT Acute Rehabilitation Services Pager [phone] Office [place] 01/22/2020, 1:06 PM

## 2020-01-22 NOTE — Progress Notes (Signed)
SATURATION QUALIFICATIONS: (This note is used to comply with regulatory documentation for home oxygen)  Patient Saturations on Room Air at Rest = 88%  Patient Saturations on Room Air while Ambulating = 85%  Patient Saturations on 3L Liters of oxygen while Ambulating = 93%  Please briefly explain why patient needs home oxygen: Needs oxygen to maintain saturations in the 90's.

## 2020-01-22 NOTE — Progress Notes (Signed)
Progress Note  Patient Name: Nole Robey Date of Encounter: 01/22/2020  [facility] Cardiologist: Kirk Ruths, MD   Subjective   A little tired. No SOB  Inpatient Medications    Scheduled Meds: . amLODipine  10 mg Oral Daily  . aspirin  81 mg Oral Daily  . calcitRIOL  0.25 mcg Oral Once per day on Mon Wed Fri  . calcium carbonate  2 tablet Oral Daily  . carvedilol  12.5 mg Oral BID WC  . ferrous sulfate  325 mg Oral Q breakfast  . gabapentin  300 mg Oral BID  . heparin  5,000 Units Subcutaneous Q8H  . hydrALAZINE  50 mg Oral TID  . insulin aspart  0-15 Units Subcutaneous TID WC  . insulin aspart  0-5 Units Subcutaneous QHS  . ipratropium-albuterol  3 mL Nebulization BID  . isosorbide mononitrate  60 mg Oral QPM  . meclizine  25 mg Oral TID  . sodium chloride flush  3 mL Intravenous Q12H  . torsemide  40 mg Oral Daily   Continuous Infusions: . sodium chloride     PRN Meds: sodium chloride, acetaminophen, albuterol, lidocaine (PF), ondansetron (ZOFRAN) IV, senna-docusate, sodium chloride flush   Vital Signs    Vitals:   01/22/20 0500 01/22/20 0515 01/22/20 0755 01/22/20 0825  BP:  135/71  132/67  Pulse:  70    Resp:  16 16   Temp:  98.2 F (36.8 C)    TempSrc:  Oral    SpO2:  93% 95%   Weight: 86.2 kg     Height:        Intake/Output Summary (Last 24 hours) at 01/22/2020 0914 Last data filed at 01/22/2020 6834 Gross per 24 hour  Intake 120 ml  Output 1675 ml  Net -1555 ml   Last 3 Weights 01/22/2020 01/21/2020 01/20/2020  Weight (lbs) 190 lb 0.6 oz 190 lb 7.6 oz 197 lb 1.6 oz  Weight (kg) 86.2 kg 86.4 kg 89.404 kg      Telemetry    Sinus rhythm- Personally Reviewed  ECG    No new- Personally Reviewed  Physical Exam   GEN: Well nourished, well developed, in no acute distress  HEENT: normal  Neck: no JVD, carotid bruits, or masses Cardiac: RRR; no murmurs, rubs, or gallops,no edema  Respiratory:  clear to auscultation bilaterally,  normal work of breathing GI: soft, nontender, nondistended, + BS MS: no deformity or atrophy  Skin: warm and dry, no rash Neuro:  Alert and Oriented x 3, Strength and sensation are intact Psych: euthymic mood, full affect   Labs    High Sensitivity Troponin:   Recent Labs  Lab 12/23/19 1059 12/25/19 0456 12/25/19 0739 01/17/20 1718 01/17/20 1935  TROPONINIHS 11 13 12 10 11       Chemistry Recent Labs  Lab 01/20/20 0536 01/21/20 0326 01/22/20 0025  NA 136 139 137  K 4.0 4.5 4.3  CL 101 99 96*  CO2 27 27 30   GLUCOSE 174* 231* 197*  BUN 65* 66* 72*  CREATININE 3.49* 3.53* 3.53*  CALCIUM 7.3* 8.0* 7.7*  ALBUMIN  --  3.3* 3.1*  GFRNONAA 17* 17* 17*  GFRAA 19* 19* 19*  ANIONGAP 8 13 11      Hematology Recent Labs  Lab 01/17/20 1718  WBC 7.0  RBC 4.09*  HGB 11.1*  HCT 37.1*  MCV 90.7  MCH 27.1  MCHC 29.9*  RDW 16.9*  PLT 271    BNPNo results for input(s): BNP, PROBNP  in the last 168 hours.   DDimer No results for input(s): DDIMER in the last 168 hours.   Radiology    No results found.  Cardiac Studies   EF 60%  Patient Profile     69 y.o. male with acute kidney injury, acute on chronic diastolic heart failure  Assessment & Plan    Acute kidney injury/chronic kidney disease stage IV -Appreciate nephrology's assistance. Now on torsemide 40 QD. Salt and fluid restriction   Chronic diastolic heart failure -Treatment as above.. BP tx  Right-sided pleural effusion -Thoracentesis in ER on admission.   For questions or updates, please contact [place] Please consult [email or web address] for contact info under        Signed, Candee Furbish, MD  01/22/2020, 9:14 AM

## 2020-01-22 NOTE — Progress Notes (Signed)
Home oxygen delivered to room

## 2020-01-22 NOTE — Progress Notes (Addendum)
[facility] Progress Note  Name: Bartow Zylstra MRN: [id number] DOB: 1950-05-18  Chief Complaint:  Shortness of breath   Subjective:  UOP 1.6L yesterday.   He feels better today from a breathing standpoint but is tired.    Would like to go home, will f/u closely with Dr. Royce Macadamia.    Review of systems:    Short of breath but better  Denies n/v Denies chest pain   --------- Background on consult:  Melvin Mcclure is a 69 y.o. male with a history of diabetes, hypertension, CKD stage III/IV, and chronic diastolic CHF who presented to the hospital with shortness of breath.  Feels like he carries his fluid in his lungs and his abdomen - his belly feels tight.  He was found to be hypoxic - sats 80's at recent appt.  He had a thoracentesis today.  He and his wife are worried about his kidney function.  Note that he was recently hospitalized with hypoxia and acute on chronic CHF.  He was aggressively diuresed and ultimately had AKI that admission.  Nephrology was consulted at that time and creatinine was 2.91 on admission and peak of 4.34 on 8/28 (the date of discharge).  At that time per charting he advocated to be able to go home that day and his diuretics were held on discharge.  I saw him later that week on 9/2 and restarted his torsemide initially at 40 mg daily then decreased to 20 mg daily - his prior regimen.  Note that his wife coordinates his care and medications.  He is conversant but is not well versed in his medications.  He required a thoracentesis last admission.  Recent admission with pneumonia this summer as well.  Other imaging includes nuclear medicine scan not consistent with amyloid.  His more distant history is notable for evaluation in the ER for AMS in 03/2017 where he was found to be cocaine positive.   We have discussed his kidney failure and high risk of progression at his past appointments.  I again discussed with him today about the risk of progression and  anticipated eventual need for dialysis and he states that he would want dialysis.  He is also interested in pursuing a transplant and with his recent decline in kidney function a referral is appropriate once more clinically stable though age may be limiting factor at many centers if he does not have a living donor.  We discussed this.  Recent labs: 01/05/20 - Cr 3.67, BUN 85, eGFR 18, Hb 10.6 01/03/20 - Cr 3.88, BUN 88, eGFR 17, K 4.5, bicarb 26, calcium 7.3, alb 3.9, phos 5.1 12/31/19 - Cr 4.34, BUN 79 (on discharge); Hb 11.2 on 12/23/19; A1c 6.6 this admit 12/05/19 - Cr 2.46, BUN 41, eGFR 30, K 4.5, bicarb 21, Ca 7.6, alb 3.9, phos 4.9, Hb 10.8, PTH 101; up/cr ratio 1766 mg/g 10/28/19 - 11/06/19 hospitalization. On 10/28/19 Cr 1.76 on admit to [facility]; Cr appears 2.7 - 2.9 on labs provided to me and was Cr 2.94 on discharge. Hb 10.1 on 11/05/19   Intake/Output Summary (Last 24 hours) at 01/22/2020 0748 Last data filed at 01/22/2020 0400 Gross per 24 hour  Intake --  Output 1675 ml  Net -1675 ml    Vitals:  Vitals:   01/21/20 1800 01/22/20 0050 01/22/20 0500 01/22/20 0515  BP: 136/68 (!) 119/58  135/71  Pulse: 67 68  70  Resp: _0 Temp: 98.8 F (37.1 C) (!) 97.4 F (  36.3 C)  98.2 F (36.8 C)  TempSrc: Oral Oral  Oral  SpO2: 97% 95%  93%  Weight:   86.2 kg   Height:         Physical Exam:  General adult male in bed in no acute distress  HEENT normocephalic atraumatic extraocular movements intact sclera anicteric Neck supple trachea midline Lungs basilar crackles and reduced sounds right base Heart regular rate and rhythm no rubs  Abdomen softer nontender distended Extremities no edema  Psych normal mood and affect Neuro - alert and oriented x 3 provides hx and follows commands   Medications reviewed   Labs:  BMP Latest Ref Rng & Units 01/22/2020 01/21/2020 01/20/2020  Glucose 70 - 99 mg/dL 197(H) 231(H) 174(H)  BUN 8 - 23 mg/dL 72(H) 66(H) 65(H)  Creatinine 0.61 - 1.24 mg/dL  3.53(H) 3.53(H) 3.49(H)  BUN/Creat Ratio 10 - 24 - - -  Sodium 135 - 145 mmol/L 137 139 136  Potassium 3.5 - 5.1 mmol/L 4.3 4.5 4.0  Chloride 98 - 111 mmol/L 96(L) 99 101  CO2 22 - 32 mmol/L _0 Calcium 8.9 - 10.3 mg/dL 7.7(L) 8.0(L) 7.3(L)     Assessment/Plan:   # AKI - Secondary to pre-renal insults with diuresis in setting of acute hypoxic resp failure due to both acute CHF and recovering from PNA - This new higher apparent baseline may also simply represent progression of his CKD    # Acute hypoxic resp failure  - s/p thoracentesis  - diruesis as below  - this AM ok on RA per chart - PT about to ambulate him to assess O2 needs.  # Acute on Chronic diastolic CHF -   start torsemide 40 BID with plans for metolazone 5 mg twice a week PRN on d/c.  - strict ins and outs, needs daily weights outpt - discussed.  # CKD stage IV - Secondary to diabetic nephropathy as well as microvascular disease from HTN. Note also ongoing pre-renal insults with some prior NSAID use. Serologic work-up negative to date and no signs of plasma cell dyscrasia.  His insight into his kidney disease has been impaired despite multiple prior discussions.  His kidney function has stabilized currently with Cr ~3.5 - with likely recent progression with multiple AKI events  - Does want HD when he progresses to that point and Dr. Royce Macadamia hasdiscussed high risk of progression at past visits.  We have discussed that given CKD progression we can discuss transplant referral further at his outpatient follow-up with her  # HTN with CKD - Continue current regimen with diuretics as above  # Secondary hyperparathyroidism and metabolic bone disease - calcitriol 0.25 mcg three times a week  - low phos diet - phos 6.1.  #Malnutrition: albumin 3.3 - encourage protein intake  # Anemia secondary to CKD - no acute indication for PRBC's  # Recurrent right sided pleural effusion  - s/p thoracentesis - diuretics  as above - cytology sent on previous thoracentesis with reactive mesothelial cells present, Numerous lymphoid cells   Dispo: I think D/C with close f/u at [place] is appropriate at this point.  I'll make sure he has f/u with Dr. Royce Macadamia in 1-2 weeks.  I'll sign off as he likely is discharging - call if we can be of further assistance.  Justin Mend, MD 01/22/2020 7:48 AM

## 2020-01-22 NOTE — TOC Initial Note (Signed)
Transition of Care [facility]) - Initial/Assessment Note    Patient Details  Name: Melvin Mcclure MRN: [id number] Date of Birth: 1951-02-06  [facility]) CM/SW Contact:    Carles Collet, RN Phone Number: 01/22/2020, 12:12 PM  Clinical Narrative:                  Spoke w patient and wife to set up home oxygen. Wife interested in over the shoulder POC.  Modified order to include this. Referral made to [facility] for delivery of tank for transport home to room, and set up of home concentrator. Anticipate delivery of tank to room later today. TOC will continue to follow for PT recs.    Expected Discharge Plan:  (TBD) Barriers to Discharge: Continued Medical Work up   Patient Goals and CMS Choice Patient states their goals for this hospitalization and ongoing recovery are:: to go home      Expected Discharge Plan and Services Expected Discharge Plan:  (TBD)                         DME Arranged: Oxygen DME Agency: [employer] Date DME Agency Contacted: 01/22/20 Time DME Agency Contacted: 1212 Representative spoke with at DME Agency: Caryl Pina            Prior Living Arrangements/Services   Lives with:: Spouse                   Activities of Daily Living Home Assistive Devices/Equipment: None ADL Screening (condition at time of admission) Patient's cognitive ability adequate to safely complete daily activities?: Yes Is the patient deaf or have difficulty hearing?: No Does the patient have difficulty seeing, even when wearing glasses/contacts?: No Does the patient have difficulty concentrating, remembering, or making decisions?: Yes Patient able to express need for assistance with ADLs?: Yes Does the patient have difficulty dressing or bathing?: No Independently performs ADLs?: Yes (appropriate for developmental age) Does the patient have difficulty walking or climbing stairs?: No Weakness of Legs: Both Weakness of Arms/Hands: None  Permission  Sought/Granted                  Emotional Assessment              Admission diagnosis:  CHF (congestive heart failure) ([place]) [I50.9] Pleural effusion [J90] Pleural effusion, right [J90] Acute respiratory failure with hypoxia (H[facility]) [J96.01] Recurrent right pleural effusion [J90] Patient Active Problem List   Diagnosis Date Noted  . Recurrent right pleural effusion 01/17/2020  . HTN (hypertension) 01/17/2020  . Acute exacerbation of CHF (congestive heart failure) ([place]) 12/23/2019  . Hypocalcemia 12/23/2019  . Anemia of chronic disease 12/23/2019  . Vertigo 12/23/2019  . Acute respiratory failure with hypoxia ([place]) 12/23/2019  . CAP (community acquired pneumonia) 10/28/2019  . Hypertensive cardiomyopathy, with heart failure ([place]) 06/15/2018  . Acute diastolic CHF (congestive heart failure) ([place]) 05/28/2018  . DM2 (diabetes mellitus, type 2) ([place]) 05/28/2018  . CKD stage 4 due to type 2 diabetes mellitus ([place]) 05/28/2018  . CHF (congestive heart failure) ([place]) 05/28/2018   PCP:  Nolene Ebbs, MD Pharmacy:   [facility] [address]), [place] - [address] [place] [address] Phone: [phone] Fax: [phone]     Social Determinants of Health (SDOH) Interventions    Readmission Risk Interventions No flowsheet data found.

## 2020-01-22 NOTE — Progress Notes (Signed)
PROGRESS NOTE    Melvin Mcclure  GLO:[id number] DOB: 07/07/1950 DOA: 01/17/2020 PCP: Nolene Ebbs, MD    Brief Narrative:  Melvin Mcclure is a 69 year old male with past medical history notable for CKD stage IV, diastolic congestive heart failure, essential hypertension, type 2 diabetes mellitus with recurrent hospitalizations who was sent in from his cardiologist office for shortness of breath after being found to have recurrent right pleural effusion with associated hypoxia.  In the ED, SPO2 was noted to be in the 80s on room air, and in the mid 90s on 2 L nasal cannula.  Nephrology and cardiology were consulted.  TRH consulted for admission.  9/17: Patient continues with shortness of breath, improved but still requiring supplemental oxygen.  Continues receive furosemide 80 mg IV twice daily with occasional metolazone doses, received 5 mg on 9/15 and 5 mg 9/17.  9/18: Continues with weakness/fatigue. Net negative 1.7L past 24h with net negative 7.7L since admission.  Continues on 3 L nasal cannula.  Creatinine stable 3.53 but BUN increased to 72.  Transition from IV furosemide to torsemide today; with plans of as needed metolazone twice weekly.  Will obtain ambulatory oxygenation on room air today.   Assessment & Plan:   Principal Problem:   Acute respiratory failure with hypoxia (HCC) Active Problems:   Acute diastolic CHF (congestive heart failure) (HCC)   DM2 (diabetes mellitus, type 2) (HCC)   CKD stage 4 due to type 2 diabetes mellitus (HCC)   Hypertensive cardiomyopathy, with heart failure (HCC)   Recurrent right pleural effusion   HTN (hypertension)   Acute hypoxic respiratory failure, present on admission Recurrent right pleural effusion Chronic diastolic congestive heart failure, decompensated Patient presenting from cardiology office with shortness of breath and found to be hypoxic with SPO2 in the low 80s on room air.  Patient was noted to have a recurrent right pleural  effusion and underwent thoracentesis on 01/18/2020 with 1.75 L removed; pleural fluid studies consistent with transudate of effusion.  Volume overload is complicated by his diastolic heart dysfunction as well as his severe kidney disease. --Nephrology and cardiology following, appreciate assistance --net negative 1.7L past 24h and net negative 7.7L since admission --wt 90.7>>86.2kg --Furosemide transitioned to torsemide today, with prn metolazone per Cards/Neph --Ambulatory saturation on room air today --Strict I's and O's and daily weights --Continue monitor renal function closely while on IV diuresis  Acute renal failure on CKD stage IV --Nephrology following as above Bridge assistance, follows outpatient with Dr. Royce Macadamia --Cr 3.6>3.49>3.53>3.53 --Continue monitor urinary output closely --Avoid nephrotoxins, renally dose all medications --Follow BMP daily  Type 2 diabetes mellitus Hemoglobin A1c 6.6 on 12/29/2019, well controlled.  Diet controlled at home. --Insulin sliding scale for coverage while inpatient --CBGs qAC/HS  Essential hypertension BP 135/71 this morning.  --Amlodipine 10 mg p.o. daily --Carvedilol 12.5 mg p.o. twice daily --Hydralazine 50 mg p.o. every 8 hours --Aspirin 81 mg p.o. daily, not on statin at home   DVT prophylaxis: Heparin Code Status: Full code Family Communication: Discussed with patient extensively at bedside  Disposition Plan:  Status is: Inpatient  Remains inpatient appropriate because:Unsafe d/c plan, IV treatments appropriate due to intensity of illness or inability to take PO and Inpatient level of care appropriate due to severity of illness   Dispo: The patient is from: Home              Anticipated d/c is to: Home  Anticipated d/c date is: 2 days              Patient currently is not medically stable to d/c.   Consultants:   Cardiology  Nephrology  Procedures:   IR thoracentesis 01/18/2020  Antimicrobials:    None   Subjective: Patient seen and examined at bedside, resting comfortably.  Continues with significant weakness and fatigue.  Although shortness of breath improved continues on supplemental oxygen at 3 L nasal cannula.  Transition from IV furosemide to p.o. torsemide today.  No other complaints or concerns at this time.  Denies headache, no dizziness, no chest pain, palpitations, no abdominal pain, no fever/chills/night sweats, no nausea/vomiting/diarrhea.  No acute events overnight per nursing staff.  Objective: Vitals:   01/22/20 0500 01/22/20 0515 01/22/20 0755 01/22/20 0825  BP:  135/71  132/67  Pulse:  70    Resp:  16 16   Temp:  98.2 F (36.8 C)    TempSrc:  Oral    SpO2:  93% 95%   Weight: 86.2 kg     Height:        Intake/Output Summary (Last 24 hours) at 01/22/2020 1044 Last data filed at 01/22/2020 0852 Gross per 24 hour  Intake 120 ml  Output 1275 ml  Net -1155 ml   Filed Weights   01/20/20 0553 01/21/20 0646 01/22/20 0500  Weight: 89.4 kg 86.4 kg 86.2 kg    Examination:  General exam: Appears calm and comfortable, chronically ill in appearance Respiratory system: Decreased breath sounds bilateral bases with mild crackles, no wheezing, normal respiratory effort without accessory muscle use.  On 3 L nasal cannula with SPO2 93% Cardiovascular system: S1 & S2 heard, RRR. No JVD, murmurs, rubs, gallops or clicks. No pedal edema. Gastrointestinal system: Abdomen is nondistended, soft and nontender. No organomegaly or masses felt. Normal bowel sounds heard. Central nervous system: Alert and oriented. No focal neurological deficits. Extremities: Symmetric 5 x 5 power. Skin: No rashes, lesions or ulcers Psychiatry: Judgement and insight appear poor.  Depressed mood & flat affect.     Data Reviewed: I have personally reviewed following labs and imaging studies  CBC: Recent Labs  Lab 01/17/20 1718  WBC 7.0  HGB 11.1*  HCT 37.1*  MCV 90.7  PLT 702   Basic  Metabolic Panel: Recent Labs  Lab 01/18/20 0430 01/19/20 0934 01/20/20 0536 01/21/20 0326 01/22/20 0025  NA 140 141 136 139 137  K 4.4 4.2 4.0 4.5 4.3  CL 107 105 101 99 96*  CO2 25 22 27 27 30   GLUCOSE 224* 158* 174* 231* 197*  BUN 68* 64* 65* 66* 72*  CREATININE 3.49* 3.60* 3.49* 3.53* 3.53*  CALCIUM 6.8* 7.3* 7.3* 8.0* 7.7*  PHOS  --   --  6.2* 6.1* 6.5*   GFR: Estimated Creatinine Clearance: 20.1 mL/min (A) (by C-G formula based on SCr of 3.53 mg/dL (H)). Liver Function Tests: Recent Labs  Lab 01/21/20 0326 01/22/20 0025  ALBUMIN 3.3* 3.1*   No results for input(s): LIPASE, AMYLASE in the last 168 hours. No results for input(s): AMMONIA in the last 168 hours. Coagulation Profile: No results for input(s): INR, PROTIME in the last 168 hours. Cardiac Enzymes: No results for input(s): CKTOTAL, CKMB, CKMBINDEX, TROPONINI in the last 168 hours. BNP (last 3 results) No results for input(s): PROBNP in the last 8760 hours. HbA1C: No results for input(s): HGBA1C in the last 72 hours. CBG: Recent Labs  Lab 01/21/20 0646 01/21/20 1110 01/21/20 1605 01/21/20  2104 01/22/20 0631  GLUCAP 202* 164* 117* 155* 220*   Lipid Profile: No results for input(s): CHOL, HDL, LDLCALC, TRIG, CHOLHDL, LDLDIRECT in the last 72 hours. Thyroid Function Tests: No results for input(s): TSH, T4TOTAL, FREET4, T3FREE, THYROIDAB in the last 72 hours. Anemia Panel: No results for input(s): VITAMINB12, FOLATE, FERRITIN, TIBC, IRON, RETICCTPCT in the last 72 hours. Sepsis Labs: No results for input(s): PROCALCITON, LATICACIDVEN in the last 168 hours.  Recent Results (from the past 240 hour(s))  Resp Panel by RT PCR (RSV, Flu A&B, Covid) - Nasopharyngeal Swab     Status: None   Collection Time: 01/17/20  9:19 PM   Specimen: Nasopharyngeal Swab  Result Value Ref Range Status   SARS Coronavirus 2 by RT PCR NEGATIVE NEGATIVE Final    Comment: (NOTE) SARS-CoV-2 target nucleic acids are NOT  DETECTED.  The SARS-CoV-2 RNA is generally detectable in upper respiratoy specimens during the acute phase of infection. The lowest concentration of SARS-CoV-2 viral copies this assay can detect is 131 copies/mL. A negative result does not preclude SARS-Cov-2 infection and should not be used as the sole basis for treatment or other patient management decisions. A negative result may occur with  improper specimen collection/handling, submission of specimen other than nasopharyngeal swab, presence of viral mutation(s) within the areas targeted by this assay, and inadequate number of viral copies (<131 copies/mL). A negative result must be combined with clinical observations, patient history, and epidemiological information. The expected result is Negative.  Fact Sheet for Patients:  PinkCheek.be  Fact Sheet for Healthcare Providers:  GravelBags.it  This test is no t yet approved or cleared by the [place] FDA and  has been authorized for detection and/or diagnosis of SARS-CoV-2 by FDA under an Emergency Use Authorization (EUA). This EUA will remain  in effect (meaning this test can be used) for the duration of the COVID-19 declaration under Section 564(b)(1) of the Act, 21 U.S.C. section 360bbb-3(b)(1), unless the authorization is terminated or revoked sooner.     Influenza A by PCR NEGATIVE NEGATIVE Final   Influenza B by PCR NEGATIVE NEGATIVE Final    Comment: (NOTE) The Xpert Xpress SARS-CoV-2/FLU/RSV assay is intended as an aid in  the diagnosis of influenza from Nasopharyngeal swab specimens and  should not be used as a sole basis for treatment. Nasal washings and  aspirates are unacceptable for Xpert Xpress SARS-CoV-2/FLU/RSV  testing.  Fact Sheet for Patients: PinkCheek.be  Fact Sheet for Healthcare Providers: GravelBags.it  This test is not yet  approved or cleared by the [place] FDA and  has been authorized for detection and/or diagnosis of SARS-CoV-2 by  FDA under an Emergency Use Authorization (EUA). This EUA will remain  in effect (meaning this test can be used) for the duration of the  Covid-19 declaration under Section 564(b)(1) of the Act, 21  U.S.C. section 360bbb-3(b)(1), unless the authorization is  terminated or revoked.    Respiratory Syncytial Virus by PCR NEGATIVE NEGATIVE Final    Comment: (NOTE) Fact Sheet for Patients: PinkCheek.be  Fact Sheet for Healthcare Providers: GravelBags.it  This test is not yet approved or cleared by the [place] FDA and  has been authorized for detection and/or diagnosis of SARS-CoV-2 by  FDA under an Emergency Use Authorization (EUA). This EUA will remain  in effect (meaning this test can be used) for the duration of the  COVID-19 declaration under Section 564(b)(1) of the Act, 21 U.S.C.  section 360bbb-3(b)(1), unless the authorization is terminated  or  revoked. Performed at [facility], [place] [address] [place], [place] [address]          Radiology Studies: No results found.      Scheduled Meds: . amLODipine  10 mg Oral Daily  . aspirin  81 mg Oral Daily  . calcitRIOL  0.25 mcg Oral Once per day on Mon Wed Fri  . calcium carbonate  2 tablet Oral Daily  . carvedilol  12.5 mg Oral BID WC  . ferrous sulfate  325 mg Oral Q breakfast  . gabapentin  300 mg Oral BID  . heparin  5,000 Units Subcutaneous Q8H  . hydrALAZINE  50 mg Oral TID  . insulin aspart  0-15 Units Subcutaneous TID WC  . insulin aspart  0-5 Units Subcutaneous QHS  . ipratropium-albuterol  3 mL Nebulization BID  . isosorbide mononitrate  60 mg Oral QPM  . meclizine  25 mg Oral TID  . sodium chloride flush  3 mL Intravenous Q12H  . torsemide  40 mg Oral Daily   Continuous Infusions: . sodium chloride       LOS: 4 days      Time spent: 37 minutes spent on chart review, discussion with nursing staff, consultants, updating family and interview/physical exam; more than 50% of that time was spent in counseling and/or coordination of care.    Collan Schoenfeld J [place] DO [facility] Available via Epic secure chat 7am-7pm After these hours, please refer to coverage provider listed on [email or web address] 01/22/2020, 10:44 AM

## 2020-01-23 LAB — RENAL FUNCTION PANEL
Albumin: 3.1 g/dL — ABNORMAL LOW (ref 3.5–5.0)
Anion gap: 11 (ref 5–15)
BUN: 76 mg/dL — ABNORMAL HIGH (ref 8–23)
CO2: 30 mmol/L (ref 22–32)
Calcium: 7.4 mg/dL — ABNORMAL LOW (ref 8.9–10.3)
Chloride: 96 mmol/L — ABNORMAL LOW (ref 98–111)
Creatinine, Ser: 4.01 mg/dL — ABNORMAL HIGH (ref 0.61–1.24)
GFR calc Af Amer: 16 mL/min — ABNORMAL LOW (ref >60)
GFR calc non Af Amer: 14 mL/min — ABNORMAL LOW (ref >60)
Glucose, Bld: 218 mg/dL — ABNORMAL HIGH (ref 70–99)
Phosphorus: 6.4 mg/dL — ABNORMAL HIGH (ref 2.5–4.6)
Potassium: 4.3 mmol/L (ref 3.5–5.1)
Sodium: 137 mmol/L (ref 135–145)

## 2020-01-23 LAB — GLUCOSE, CAPILLARY: Glucose-Capillary: 179 mg/dL — ABNORMAL HIGH (ref 70–99)

## 2020-01-23 MED ORDER — METOLAZONE 5 MG PO TABS: 5.0000 mg | ORAL_TABLET | ORAL | 0 refills | Status: DC | PRN

## 2020-01-23 MED ORDER — TORSEMIDE 20 MG PO TABS: 40.0000 mg | ORAL_TABLET | Freq: Two times a day (BID) | ORAL | 0 refills | Status: DC

## 2020-01-23 NOTE — Progress Notes (Signed)
Melvin Mcclure to be D/C'd Home per MD order.  Discussed with the patient and all questions fully answered.   VSS, Skin clean, dry and intact without evidence of skin break down, no evidence of skin tears noted. IV catheter discontinued intact. Site without signs and symptoms of complications. Dressing and pressure applied.   An After Visit Summary was printed and given to the patient.    D/C education completed with patient/family including follow up instructions, medication list, d/c activities limitations if indicated, with other d/c instructions as indicated by MD - patient able to verbalize understanding, all questions fully answered.    Patient instructed to return to ED, call 911, or call MD for any changes in condition.    Patient escorted via [place], and D/C home via Car.

## 2020-01-23 NOTE — Progress Notes (Addendum)
Nurse paged Dr. Marlowe Sax. Upon entering patient room, oxygen tubing was tied around his neck. Pt looked flushed. He was standing on the edge of the bed short of breath. He did not know where he was and what date it was. He was disoriented.  He pulled out all of his lines and the cardiac monitor along with his clothing. I sat patient on the edge of the bed. Untangled his tubing so he can get adequate oxygen. EKG performed on patient and included reading that stated **Bifascicular block**. Provider also made aware. CBG taken, and vitals taken. Oxygen saturation during intervention was 99% on 3L of oxygen. After a minute, pt began to be more coherent. He became alert and oriented x4. He stated "I thought I was having a bad dream."

## 2020-01-23 NOTE — Progress Notes (Signed)
Progress Note  Patient Name: Melvin Mcclure Date of Encounter: 01/23/2020  [facility] Cardiologist: Kirk Ruths, MD   Subjective   Pt denies CP or dyspnea; oriented  Inpatient Medications    Scheduled Meds:  amLODipine  10 mg Oral Daily   aspirin  81 mg Oral Daily   calcitRIOL  0.25 mcg Oral Once per day on Mon Wed Fri   calcium carbonate  2 tablet Oral Daily   carvedilol  12.5 mg Oral BID WC   ferrous sulfate  325 mg Oral Q breakfast   gabapentin  300 mg Oral BID   heparin  5,000 Units Subcutaneous Q8H   hydrALAZINE  50 mg Oral TID   insulin aspart  0-15 Units Subcutaneous TID WC   insulin aspart  0-5 Units Subcutaneous QHS   ipratropium-albuterol  3 mL Nebulization BID   isosorbide mononitrate  60 mg Oral QPM   meclizine  25 mg Oral TID   sodium chloride flush  3 mL Intravenous Q12H   torsemide  40 mg Oral BID   Continuous Infusions:  sodium chloride     PRN Meds: sodium chloride, acetaminophen, albuterol, lidocaine (PF), ondansetron (ZOFRAN) IV, senna-docusate, sodium chloride flush   Vital Signs    Vitals:   01/22/20 1132 01/22/20 1646 01/23/20 0023 01/23/20 0607  BP: 122/65 138/69 (!) 128/56 (!) 141/77  Pulse: 70 69 70 69  Resp: 16 16 17 18   Temp: 98.2 F (36.8 C) 98.5 F (36.9 C) 98 F (36.7 C) 98.5 F (36.9 C)  TempSrc: Oral Oral Oral Oral  SpO2: 98% 100% 98% 99%  Weight:    89.1 kg  Height:        Intake/Output Summary (Last 24 hours) at 01/23/2020 0830 Last data filed at 01/23/2020 0017 Gross per 24 hour  Intake 365 ml  Output 1050 ml  Net -685 ml   Last 3 Weights 01/23/2020 01/22/2020 01/21/2020  Weight (lbs) 196 lb 6.4 oz 190 lb 0.6 oz 190 lb 7.6 oz  Weight (kg) 89.086 kg 86.2 kg 86.4 kg      Telemetry    Sinus - Personally Reviewed  Physical Exam   GEN: No acute distress.   Neck: No JVD Cardiac: RRR, no murmurs, rubs, or gallops.  Respiratory: Clear to auscultation bilaterally. GI: Soft, nontender,  non-distended  MS: No edema Neuro:  Nonfocal  Psych: Normal affect   Labs    High Sensitivity Troponin:   Recent Labs  Lab 12/25/19 0456 12/25/19 0739 01/17/20 1718 01/17/20 1935  TROPONINIHS 13 12 10 11       Chemistry Recent Labs  Lab 01/21/20 0326 01/22/20 0025 01/23/20 0229  NA 139 137 137  K 4.5 4.3 4.3  CL 99 96* 96*  CO2 27 30 30   GLUCOSE 231* 197* 218*  BUN 66* 72* 76*  CREATININE 3.53* 3.53* 4.01*  CALCIUM 8.0* 7.7* 7.4*  ALBUMIN 3.3* 3.1* 3.1*  GFRNONAA 17* 17* 14*  GFRAA 19* 19* 16*  ANIONGAP 13 11 11      Hematology Recent Labs  Lab 01/17/20 1718  WBC 7.0  RBC 4.09*  HGB 11.1*  HCT 37.1*  MCV 90.7  MCH 27.1  MCHC 29.9*  RDW 16.9*  PLT 271     Patient Profile     69 y.o. male with acute on chronic stage IV kidney disease and chronic diastolic congestive heart failure admitted with volume excess.  Most recent echocardiogram June 2021 showed normal LV function, moderate left ventricular hypertrophy, mild mitral regurgitation;  trivial to small pericardial effusion.  PYP scan August 2021 not suggestive of amyloidosis.  Assessment & Plan    1 chronic diastolic congestive heart failure-patient does not appear to be volume overloaded on examination.  He feels much better after diuresis and thoracentesis.  Predominant issue appears to be worsening renal function.  Continue diuretics at present dose as outlined by nephrology.  2 acute on chronic stage IV kidney disease-creatinine slightly increased today.  Continue diuretics at present dose.  He will need close follow-up with nephrology following discharge.  3 HTN-patient's blood pressure is controlled.  Continue present medications.  Patient can be discharged from a cardiac standpoint.  We will arrange follow-up with APP in 4 to 6 weeks.  As outlined above predominant issue appears to be worsening renal function.  Needs close follow-up with nephrology.  Continue present cardiac medications.  Please  call with questions.  For questions or updates, please contact [place] Please consult [email or web address] for contact info under        Signed, Kirk Ruths, MD  01/23/2020, 8:30 AM

## 2020-01-23 NOTE — TOC Progression Note (Signed)
Transition of Care [facility]) - Progression Note    Patient Details  Name: Melvin Mcclure MRN: [id number] Date of Birth: 10-18-1950  [facility] of [facility]) CM/SW Contact  Sukhman Kocher, Edson Snowball, RN Phone Number: 01/23/2020, 10:13 AM  Clinical Narrative:      Caryl Pina with [employer] delivered portable POC to room yesterday. Expected Discharge Plan:  (TBD) Barriers to Discharge: Continued Medical Work up  Expected Discharge Plan and Services Expected Discharge Plan:  (TBD)         Expected Discharge Date: 01/23/20               DME Arranged: Oxygen DME Agency: [facility] Date DME Agency Contacted: 01/22/20 Time DME Agency Contacted: 1212 Representative spoke with at DME Agency: [facility] Determinants of Health ([place]) Interventions    Readmission Risk Interventions No flowsheet data found.

## 2020-01-23 NOTE — Discharge Instructions (Signed)
Preventing Heart Failure Heart failure is a condition in which the heart has trouble pumping blood because it has become weak or stiff. This may mean that the heart cannot pump enough blood out to the body or that the heart does not fill up with enough blood. Either of those problems can lead to symptoms such as tiredness (fatigue), trouble breathing, and swelling throughout the body. This is a common medical condition that affects not only the heart, but the entire body. Making certain nutrition and lifestyle changes can help you prevent heart failure and avoid serious health problems. How can this condition affect me? Heart failure can cause very serious problems that may get worse over time, such as:  Extreme fatigue during normal physical activities.  Shortness of breath or trouble breathing.  Swelling in your abdomen, legs, ankles, feet, or neck.  Fluid buildup throughout the body.  Weight gain.  Cough.  Frequent urination. What can increase my risk? The risk of heart failure increases as a person ages. The following factors may also make you more likely to develop this condition:  Being overweight.  Being male.  Smoking or chewing tobacco.  Abusing alcohol or drugs.  Having taken medicines that can damage the heart, such as chemotherapy drugs.  Having any of these medical conditions: ? Diabetes. ? Abnormal heart rhythms. ? Thyroid problems. ? Low blood counts (anemia). What actions can I take to prevent heart failure?     Nutrition  If you are overweight or obese, reduce how many calories you eat each day so that you lose weight. Work with your health care provider or a diet and nutrition specialist (dietitian) to determine how many calories you need each day.  Eat foods that are low in salt (sodium). Avoid adding extra salt to foods. This can help keep your blood pressure in a normal range.  Eat a well-balanced diet that includes a lot of: ? Fresh fruits and  vegetables. ? Whole grains. ? Lean meats. ? Beans. ? Fat-free or low-fat dairy products.  Avoid foods that contain a lot of: ? Trans fats. ? Saturated fats. ? Sugar. ? Cholesterol. Alcohol  Do not drink alcohol if: ? Your health care provider tells you not to drink. ? You are pregnant, may be pregnant, or are planning to become pregnant.  If you drink alcohol: ? Limit how much you use to:  0-1 drink a day for women.  0-2 drinks a day for men. ? Be aware of how much alcohol is in your drink. In the [place] one drink equals one 12 oz bottle of beer (355 mL), one 5 oz glass of wine (148 mL), or one 1 oz glass of hard liquor (44 mL). Lifestyle  Do not use any products that contain nicotine or tobacco, such as cigarettes, e-cigarettes, and chewing tobacco. If you need help quitting, ask your health care provider.  Exercise for at least 150 minutes each week, or as much as told by your health care provider. ? Do moderate-intensity exercise, such as brisk walking, bicycling, or water aerobics. ? Ask your health care provider which activities are safe for you.  Try to get 7-9 hours of sleep each night. To help with sleep: ? Keep your bedroom cool and dark. ? Do not eat a heavy meal during the hour before you go to bed. ? Do not drink alcohol or caffeinated drinks before bed. ? Avoid screen time before bedtime. This means avoiding television, computers, tablets, and mobile phones.  Find ways to relax and manage stress. These may include: ? Breathing exercises. ? Meditation. ? Yoga. ? Listening to music. General instructions  See a health care provider regularly for screening and wellness checks. Work with your health care provider to manage your: ? Blood pressure. ? Cholesterol levels. ? Blood sugar (glucose) levels. ? Weight and BMI.  If you have diabetes, manage your condition and follow your treatment plan as instructed. Where to find more information  National Heart,  Lung, and Blood Institute: https://wilson-eaton.com/  Centers for Disease Control and Prevention: http://www.wolf.info/  National Institute on Aging: http://kim-miller.com/  American Heart Association: www.heart.org Contact a health care provider if:  You have rapid weight gain.  You have increasing shortness of breath that is unusual for you.  You tire easily or you are unable to participate in your usual activities.  You cough more than normal, especially with physical activity.  You have any swelling or more swelling in areas such as your hands, feet, ankles, or abdomen. Get help right away if you have:  Trouble breathing.  Pain or discomfort in your chest.  An episode of fainting. These symptoms may represent a serious problem that is an emergency. Do not wait to see if the symptoms will go away. Get medical help right away. Call your local emergency services (911 in the [place] Do not drive yourself to the hospital. Summary  Heart failure can cause very serious problems over time.  Heart failure can be prevented by making changes to your diet and your lifestyle.  It is important to eat a healthy diet, manage your weight, exercise regularly, manage stress, avoid drugs and alcohol, and keep your cholesterol and blood pressure under control. This information is not intended to replace advice given to you by your health care provider. Make sure you discuss any questions you have with your health care provider. Document Revised: 11/19/2018 Document Reviewed: 11/19/2018 Elsevier Patient Education  [place]. Chronic Kidney Disease, Adult Chronic kidney disease (CKD) occurs when the kidneys become damaged slowly over a long period of time. The kidneys are a pair of organs that do many important jobs in the body, including:  Removing waste and extra fluid from the blood to make urine.  Making hormones that maintain the amount of fluid in tissues and blood vessels.  Maintaining the right amount  of fluids and chemicals in the body. A small amount of kidney damage may not cause problems, but a large amount of damage may make it hard or impossible for the kidneys to work the way they should. If steps are not taken to slow down kidney damage or to stop it from getting worse, the kidneys may stop working permanently (end-stage renal disease or ESRD). Most of the time, CKD does not go away, but it can often be controlled. People who have CKD are usually able to live normal lives. What are the causes? The most common causes of this condition are diabetes and high blood pressure (hypertension). Other causes include:  Heart and blood vessel (cardiovascular) disease.  Kidney diseases, such as: ? Glomerulonephritis. ? Interstitial nephritis. ? Polycystic kidney disease. ? Renal vascular disease.  Diseases that affect the immune system.  Genetic diseases.  Medicines that damage the kidneys, such as anti-inflammatory medicines.  Being around or being in contact with poisonous (toxic) substances.  A kidney or urinary infection that occurs again and again (recurs).  Vasculitis. This is swelling or inflammation of the blood vessels.  A problem with  urine flow that may be caused by: ? Cancer. ? Having kidney stones more than one time. ? An enlarged prostate, in males. What increases the risk? You are more likely to develop this condition if you:  Are older than age 65.  Are male.  Are African-American, Hispanic, Asian, [place], or American [place].  Are a current or former smoker.  Are obese.  Have a family history of kidney disease or failure.  Often take medicines that are damaging to the kidneys. What are the signs or symptoms? Symptoms of this condition include:  Swelling (edema) of the face, legs, ankles, or feet.  Tiredness (lethargy) and having less energy.  Nausea or vomiting.  Confusion or trouble concentrating.  Problems with urination, such  as: ? Painful or burning feeling during urination. ? Decreased urine production. ? Frequent urination, especially at night. ? Bloody urine.  Muscle twitches and cramps, especially in the legs.  Shortness of breath.  Weakness.  Loss of appetite.  Metallic taste in the mouth.  Trouble sleeping.  Dry, itchy skin.  A low blood count (anemia).  Pale lining of the eyelids and surface of the eye (conjunctiva). Symptoms develop slowly and may not be obvious until the kidney damage becomes severe. It is possible to have kidney disease for years without having any symptoms. How is this diagnosed? This condition may be diagnosed based on:  Blood tests.  Urine tests.  Imaging tests, such as an ultrasound or CT scan.  A test in which a sample of tissue is removed from the kidneys to be examined under a microscope (kidney biopsy). These test results will help your health care provider determine how serious the CKD is. How is this treated? There is no cure for most cases of this condition, but treatment usually relieves symptoms and prevents or slows the progression of the disease. Treatment may include:  Making diet changes, which may require you to avoid alcohol, salty foods (sodium), and foods that are high in potassium, calcium, and protein.  Medicines: ? To lower blood pressure. ? To control blood glucose. ? To relieve anemia. ? To relieve swelling. ? To protect your bones. ? To improve the balance of electrolytes in your blood.  Removing toxic waste from the body through types of dialysis, if the kidneys can no longer do their job (kidney failure).  Managing any other conditions that are causing your CKD or making it worse. Follow these instructions at home: Medicines  Take over-the-counter and prescription medicines only as told by your health care provider. The dose of some medicines that you take may need to be adjusted.  Do not take any new medicines unless approved  by your health care provider. Many medicines can worsen your kidney damage.  Do not take any vitamin and mineral supplements unless approved by your health care provider. Many nutritional supplements can worsen your kidney damage. General instructions  Follow your prescribed diet as told by your health care provider.  Do not use any products that contain nicotine or tobacco, such as cigarettes and e-cigarettes. If you need help quitting, ask your health care provider.  Monitor and track your blood pressure at home. Report changes in your blood pressure as told by your health care provider.  If you are being treated for diabetes, monitor and track your blood sugar (blood glucose) levels as told by your health care provider.  Maintain a healthy weight. If you need help with this, ask your health care provider.  Start  or continue an exercise plan. Exercise at least 30 minutes a day, 5 days a week.  Keep your immunizations up to date as told by your health care provider.  Keep all follow-up visits as told by your health care provider. This is important. Where to find more information  American Association of Kidney Patients: BombTimer.gl  National Kidney Foundation: www.kidney.[place]: https://mathis.com/  [facility]: www.lifeoptions.org and www.kidneyschool.org Contact a health care provider if:  Your symptoms get worse.  You develop new symptoms. Get help right away if:  You develop symptoms of ESRD, which include: ? Headaches. ? Numbness in the hands or feet. ? Easy bruising. ? Frequent hiccups. ? Chest pain. ? Shortness of breath. ? Lack of menstruation, in women.  You have a fever.  You have decreased urine production.  You have pain or bleeding when you urinate. Summary  Chronic kidney disease (CKD) occurs when the kidneys become damaged slowly over a long period of time.  The most common causes of this condition are  diabetes and high blood pressure (hypertension).  There is no cure for most cases of this condition, but treatment usually relieves symptoms and prevents or slows the progression of the disease. Treatment may include a combination of medicines and lifestyle changes. This information is not intended to replace advice given to you by your health care provider. Make sure you discuss any questions you have with your health care provider. Document Revised: 04/03/2017 Document Reviewed: 05/29/2016 Elsevier Patient Education  2020 [employer].

## 2020-01-23 NOTE — Discharge Summary (Signed)
Physician Discharge Summary  Melvin Mcclure ZOX:[id number] DOB: Jan 05, 1951 DOA: 01/17/2020  PCP: Nolene Ebbs, MD  Admit date: 01/17/2020 Discharge date: 01/23/2020  Admitted From: Home Disposition: Home  Recommendations for Outpatient Follow-up:  1. Follow up with PCP in 1-2 weeks 2. Follow-up with nephrology, Dr. Royce Macadamia as scheduled 3. Follow-up with cardiology as scheduled 4. Discharged on increased torsemide 40 mg p.o. twice daily with metolazone to use twice weekly as needed for increased shortness of breath, edema, or weight gain 5. Discharged on supplemental oxygen 3 L per nasal cannula 6. Please obtain BMP in one week 7. Please follow up on the following pending results:  Home Health: No Equipment/Devices: Oxygen, 3 L per nasal cannula  Discharge Condition: Stable CODE STATUS: Full code Diet recommendation: Renal/low-salt diet  History of present illness:  Melvin Mcclure is a 69 year old male with past medical history notable for CKD stage IV, diastolic congestive heart failure, essential hypertension, type 2 diabetes mellitus with recurrent hospitalizations who was sent in from his cardiologist office for shortness of breath after being found to have recurrent right pleural effusion with associated hypoxia.  In the ED, SPO2 was noted to be in the 80s on room air, and in the mid 90s on 2 L nasal cannula.  Nephrology and cardiology were consulted.  TRH consulted for admission.  Hospital course:  Acute hypoxic respiratory failure, present on admission Recurrent right pleural effusion Chronic diastolic congestive heart failure, decompensated Patient presenting from cardiology office with shortness of breath and found to be hypoxic with SPO2 in the low 80s on room air.  Patient was noted to have a recurrent right pleural effusion and underwent thoracentesis on 01/18/2020 with 1.75 L removed; pleural fluid studies consistent with transudate of effusion.  Volume overload is  complicated by his diastolic heart dysfunction as well as his severe kidney disease.  Nephrology and cardiology were consulted and followed during hospital course.  Patient was started on aggressive IV diuresis with furosemide with good output and was net negative 8.4 L during the hospitalization.  Patient was then transitioned to torsemide at a higher dose of 40 mg p.o. twice daily; which he will continue outpatient in addition to metolazone 5 mg p.o. twice weekly as needed for increased shortness of breath, weight gain, edema.  Patient will need home supplemental oxygen at 3 L per nasal cannula with exertion.  Will need close outpatient follow-up with both cardiology and nephrology following discharge.  Ultimately, patient will likely need hemodialysis in the near future given his recurrent hospitalizations and his difficulty managing his overall volume with diuretics given his severe renal insufficiency.  Acute renal failure on CKD stage IV Nephrology following as above Bridge assistance, follows outpatient with Dr. Royce Macadamia.  Increased home torsemide to 40 mg p.o. twice daily with occasional twice weekly metolazone as above.  Given his recurrent hospitalizations as above with difficulty managing his volume overload with his renal insufficiency, likely will need HD in the near future.  Outpatient follow-up with Dr. Royce Macadamia.  Type 2 diabetes mellitus Hemoglobin A1c 6.6 on 12/29/2019, well controlled.  Diet controlled at home.  Essential hypertension Continue home amlodipine 10 mg p.o. daily, Carvedilol 12.5 mg p.o. twice daily, Hydralazine 50 mg p.o. every 8 hours.  Continue aspirin.  Not on statin at home.  Follow-up with cardiology.  Discharge Diagnoses:  Principal Problem:   Acute respiratory failure with hypoxia (HCC) Active Problems:   Acute diastolic CHF (congestive heart failure) (HCC)   DM2 (diabetes mellitus, type 2) (  [place])   CKD stage 4 due to type 2 diabetes mellitus ([place])   Hypertensive  cardiomyopathy, with heart failure (HCC)   Recurrent right pleural effusion   HTN (hypertension)    Discharge Instructions  Discharge Instructions    (HEART FAILURE PATIENTS) Call MD:  Anytime you have any of the following symptoms: 1) 3 pound weight gain in 24 hours or 5 pounds in 1 week 2) shortness of breath, with or without a dry hacking cough 3) swelling in the hands, feet or stomach 4) if you have to sleep on extra pillows at night in order to breathe.   Complete by: As directed    Call MD for:  difficulty breathing, headache or visual disturbances   Complete by: As directed    Call MD for:  extreme fatigue   Complete by: As directed    Call MD for:  persistant dizziness or light-headedness   Complete by: As directed    Call MD for:  persistant nausea and vomiting   Complete by: As directed    Call MD for:  severe uncontrolled pain   Complete by: As directed    Call MD for:  temperature >100.4   Complete by: As directed    Diet - low sodium heart healthy   Complete by: As directed    Increase activity slowly   Complete by: As directed      Allergies as of 01/23/2020   No Known Allergies     Medication List    TAKE these medications   amLODipine 10 MG tablet Commonly known as: NORVASC Take 10 mg by mouth daily.   aspirin 81 MG tablet Take 81 mg by mouth daily.   calcitRIOL 0.25 MCG capsule Commonly known as: ROCALTROL Take 0.25 mcg by mouth 3 (three) times a week. Monday, Wednesday, Friday   calcium carbonate 500 MG chewable tablet Commonly known as: TUMS - dosed in mg elemental calcium Chew 2 tablets by mouth daily.   carvedilol 12.5 MG tablet Commonly known as: COREG Take 1 tablet (12.5 mg total) by mouth 2 (two) times daily with a meal.   diclofenac Sodium 1 % Gel Commonly known as: VOLTAREN Apply 2 g topically 4 (four) times daily. What changed:   when to take this  reasons to take this   ferrous sulfate 325 (65 FE) MG tablet Take 325 mg by  mouth daily with breakfast.   gabapentin 300 MG capsule Commonly known as: NEURONTIN Take 1 capsule (300 mg total) by mouth 2 (two) times daily.   hydrALAZINE 50 MG tablet Commonly known as: APRESOLINE Take 1 tablet (50 mg total) by mouth every 8 (eight) hours. What changed: when to take this   isosorbide mononitrate 60 MG 24 hr tablet Commonly known as: IMDUR Take 1 tablet (60 mg total) by mouth every evening.   meclizine 25 MG tablet Commonly known as: ANTIVERT Take 1 tablet (25 mg total) by mouth 3 (three) times daily.   metolazone 5 MG tablet Commonly known as: ZAROXOLYN Take 1 tablet (5 mg total) by mouth as needed (Twice weekly as needed for shortness of breath, swelling, weight gain).   senna-docusate 8.6-50 MG tablet Commonly known as: Senokot-S Take 1 tablet by mouth 2 (two) times daily as needed for mild constipation.   torsemide 20 MG tablet Commonly known as: DEMADEX Take 2 tablets (40 mg total) by mouth 2 (two) times daily. What changed:   how much to take  when to take this  Durable Medical Equipment  (From admission, onward)         Start     Ordered   01/22/20 1207  For home use only DME oxygen  Once       Comments: Please provide POC  Question Answer Comment  Length of Need Lifetime   Mode or (Route) Nasal cannula   Liters per Minute 3   Frequency Continuous (stationary and portable oxygen unit needed)   Oxygen conserving device Yes   Oxygen delivery system Gas      01/22/20 1207          Follow-up [place] Follow up.   Why: Home Oxygen Contact information: [place] [address] [phone]        Nolene Ebbs, MD. Schedule an appointment as soon as possible for a visit in 1 week(s).   Specialty: Internal Medicine Contact information: [place] [address] [phone]        Lelon Perla, MD. Schedule an appointment as soon as possible for a  visit in 2 day(s).   Specialty: Cardiology Contact information: [address] [place] [address] [phone]        Claudia Desanctis, MD. Schedule an appointment as soon as possible for a visit in 1 week(s).   Specialty: Nephrology Contact information: [place] [address] [phone]              No Known Allergies  Consultations:  Cardiology  Nephrology   Procedures/Studies: DG Chest 1 View  Result Date: 01/18/2020 CLINICAL DATA:  Status post thoracentesis EXAM: CHEST  1 VIEW COMPARISON:  January 17, 2020 FINDINGS: No pneumothorax. Right pleural effusion nearly completely resolved after thoracentesis. There is slight bibasilar atelectasis. Lungs elsewhere clear. Heart is mildly enlarged with pulmonary vascularity normal. No adenopathy. No bone lesions. IMPRESSION: No pneumothorax. Nearly complete resolution of right-sided pleural effusion. Bibasilar atelectasis. Stable cardiac prominence. Electronically Signed   By: Lowella Grip III M.D.   On: 01/18/2020 10:36   DG Chest 1 View  Result Date: 12/26/2019 CLINICAL DATA:  Status post right-sided thoracentesis EXAM: CHEST  1 VIEW COMPARISON:  12/23/2019 FINDINGS: Interval decrease in right-sided pleural effusion status post thoracentesis. Mild hazy right basilar opacity, significantly improved from prior. Left lung remains clear. No pneumothorax. Stable heart size. IMPRESSION: 1. Decreased right-sided pleural effusion status post thoracentesis. No pneumothorax. 2. Improved aeration of the right lung base. Electronically Signed   By: Davina Poke D.O.   On: 12/26/2019 16:14   DG Chest 2 View  Result Date: 01/17/2020 CLINICAL DATA:  Shortness of breath EXAM: CHEST - 2 VIEW COMPARISON:  December 26, 2019 FINDINGS: The heart size and mediastinal contours are unchanged with mild cardiomegaly. A moderate right pleural effusion is seen. There is patchy airspace opacity at the right lung base which  could be due to atelectasis or layering effusion. The left lung is clear. No acute osseous abnormality. IMPRESSION: Moderate right pleural effusion with adjacent patchy airspace opacity which could be atelectasis/layering effusion. Electronically Signed   By: Prudencio Pair M.D.   On: 01/17/2020 17:13   NM CARDIAC AMYLOID TUMOR LOC INFLAM SPECT 1 DAY  Result Date: 12/26/2019 CLINICAL DATA:  HEART FAILURE. CONCERN FOR CARDIAC AMYLOIDOSIS. EXAM: NUCLEAR MEDICINE TUMOR LOCALIZATION. PYP CARDIAC AMYLOIDOSIS SCAN WITH SPECT TECHNIQUE: Following intravenous administration of radiopharmaceutical, anterior planar images of the chest were obtained. Regions of interest were placed on the heart  and contralateral chest wall for quantitative assessment. Additional SPECT imaging of the chest was obtained. RADIOPHARMACEUTICALS:  8 mCi TECHNETIUM 99 PYROPHOSPHATE FINDINGS: Planar Visual assessment: Anterior planar imaging demonstrates radiotracer uptake within the heart less than uptake within the adjacent ribs (Grade 1). Quantitative assessment : Quantitative assessment of the cardiac uptake compared to the contralateral chest wall is equal to 1.1 (H/CL = 1.1). SPECT assessment: SPECT imaging of the chest demonstrates minimal radiotracer accumulation within the LEFT ventricle. IMPRESSION: Visual and quantitative assessment (grade 1, H/CLL equal 1.1) are NOT suggestive of transthyretin amyloidosis. Electronically Signed   By: Suzy Bouchard M.D.   On: 12/26/2019 16:57   US RENAL  Addendum Date: 12/28/2019   ADDENDUM REPORT: 12/28/2019 11:47 ADDENDUM: Findings should read as follows: Right Kidney: Renal measurements: 9.2 x 4.6 x 5.2 cm = volume: 114 mL. Echogenic renal parenchyma with increased conspicuity between the cortex and medullary interface. No evidence of hydronephrosis or nephrolithiasis. No cystic or solid masses. Left Kidney: Renal measurements: 10.3 x 4.8 x 4.8 cm = volume: 123 mL. Echogenic renal parenchyma  with increased conspicuity between the cortex and medullary interface. No evidence of hydronephrosis or nephrolithiasis. No cystic or solid masses. Electronically Signed   By: Jacqulynn Cadet M.D.   On: 12/28/2019 11:47   Result Date: 12/28/2019 CLINICAL DATA:  69 year old male with stage 4 chronic kidney disease EXAM: RENAL / URINARY TRACT ULTRASOUND COMPLETE COMPARISON:  None. FINDINGS: Right Kidney: Renal measurements: 9.2 x 4.6 x 5.2 cm = volume: 114 mL. Echogenic renal parenchyma with increased conspicuity between the cortex and medullary interface. No evidence of hydronephrosis or nephrolithiasis. No cystic or solid masses. Left Kidney: Renal measurements: 10.3 x 4.8 x 4.8 cm = volume: 123 mL. She denies any prior health problems or medication use. She started feeling short of breath about 3 days prior to admission. Associated with chest pressure in mid chest and nausea. Came to ER and CT angio showed PE. She was started on heparin gtt and supplemental oxygen. BP 176/104, HR 104, SpO2 96% on 2 liters oxygen during my examination of her. She reports improvement in dyspnea and chest pain. Denies syncopal episodes. Bladder: Appears normal for degree of bladder distention. Good visualization of both ureteral jets. Other: Moderate right pleural effusion. IMPRESSION: 1. No evidence of hydronephrosis. 2. Echogenic renal parenchyma bilaterally consistent with medical renal disease. 3. Moderate right pleural effusion. Electronically Signed: By: Jacqulynn Cadet M.D. On: 12/25/2019 12:40   [place] ASCITES (ABDOMEN LIMITED)  Result Date: 01/18/2020 CLINICAL DATA:  Abdominal distension EXAM: LIMITED ABDOMEN ULTRASOUND FOR ASCITES TECHNIQUE: Limited ultrasound survey for ascites was performed in all four abdominal quadrants. COMPARISON:  None. FINDINGS: Mild ascites is noted primarily within the right pericolic gutter. IMPRESSION: Mild ascites primarily on the right. Electronically Signed   By: Inez Catalina M.D.   On:  01/18/2020 13:51   VAS US RENAL ARTERY DUPLEX  Result Date: 12/27/2019 ABDOMINAL VISCERAL Indications: Hypertension. High Risk Factors: Hyperlipidemia, Diabetes, coronary artery disease. Other Factors: Stage IV chronic kidney disease. Comparison Study: No prior. Performing Technologist: Oda Cogan RDMS, RVT  Examination Guidelines: A complete evaluation includes B-mode imaging, spectral Doppler, color Doppler, and power Doppler as needed of all accessible portions of each vessel. Bilateral testing is considered an integral part of a complete examination. Limited examinations for reoccurring indications may be performed as noted.  Duplex Findings: +--------------------+--------+--------+------+--------+ Mesenteric          PSV cm/sEDV cm/sPlaqueComments +--------------------+--------+--------+------+--------+ Aorta Mid  92                          +--------------------+--------+--------+------+--------+ Celiac Artery Origin  269                          +--------------------+--------+--------+------+--------+ SMA Proximal          222                          +--------------------+--------+--------+------+--------+    +------------------+--------+--------+-------+ Right Renal ArteryPSV cm/sEDV cm/sComment +------------------+--------+--------+-------+ Origin              123      16           +------------------+--------+--------+-------+ Proximal            122      17           +------------------+--------+--------+-------+ Mid                  58      13           +------------------+--------+--------+-------+ Distal               59      12           +------------------+--------+--------+-------+ +-----------------+--------+--------+-------+ Left Renal ArteryPSV cm/sEDV cm/sComment +-----------------+--------+--------+-------+ Origin              69      10           +-----------------+--------+--------+-------+ Proximal             72      10           +-----------------+--------+--------+-------+ Mid                 46      9            +-----------------+--------+--------+-------+ Distal              42      10           +-----------------+--------+--------+-------+ +------------+--------+--------+----+-----------+--------+--------+----+ Right KidneyPSV cm/sEDV cm/sRI  Left KidneyPSV cm/sEDV cm/sRI   +------------+--------+--------+----+-----------+--------+--------+----+ Upper Pole  23      6       0.76Upper Pole 27      7       0.74 +------------+--------+--------+----+-----------+--------+--------+----+ Mid         29      6       0.81Mid        30      6       0.81 +------------+--------+--------+----+-----------+--------+--------+----+ Lower Pole  24                  Lower Pole 26      8       0.69 +------------+--------+--------+----+-----------+--------+--------+----+ Hilar       45      8       0.82Hilar      24      7       0.73 +------------+--------+--------+----+-----------+--------+--------+----+ +------------------+-----+------------------+-----+ Right Kidney           Left Kidney             +------------------+-----+------------------+-----+ RAR                    RAR                     +------------------+-----+------------------+-----+  RAR (manual)      1.3  RAR (manual)      0.78  +------------------+-----+------------------+-----+ Cortex                 Cortex                  +------------------+-----+------------------+-----+ Cortex thickness       Corex thickness         +------------------+-----+------------------+-----+ Kidney length (cm)11.40Kidney length (cm)10.07 +------------------+-----+------------------+-----+  Summary: Renal:  Right: No evidence of right renal artery stenosis. Abnormal right        Resistive Index. Left:  No evidence of left renal artery stenosis. Abnormal left        Resisitve Index.  *See table(s) above  for measurements and observations.  Diagnosing physician: Harold Barban MD  Electronically signed by Harold Barban MD on 12/27/2019 at 5:40:10 PM.    Final    IR THORACENTESIS ASP PLEURAL SPACE W/IMG GUIDE  Result Date: 01/18/2020 INDICATION: Patient history of HF, dyspnea, and recurrent right pleural effusion. Request is made for therapeutic right thoracentesis. EXAM: ULTRASOUND GUIDED THERAPEUTIC RIGHT THORACENTESIS MEDICATIONS: 10 mL 1% lidocaine COMPLICATIONS: None immediate. PROCEDURE: An ultrasound guided thoracentesis was thoroughly discussed with the patient and questions answered. The benefits, risks, alternatives and complications were also discussed. The patient understands and wishes to proceed with the procedure. Written consent was obtained. Ultrasound was performed to localize and mark an adequate pocket of fluid in the right chest. The area was then prepped and draped in the normal sterile fashion. 1% Lidocaine was used for local anesthesia. Under ultrasound guidance a 6 Fr Safe-T-Centesis catheter was introduced. Thoracentesis was performed. The catheter was removed and a dressing applied. FINDINGS: A total of approximately 1.75 L of hazy amber fluid was removed. IMPRESSION: Successful ultrasound guided right thoracentesis yielding 1.75 L of pleural fluid. Read by: Earley Abide, PA-C Electronically Signed   By: Ruthann Cancer MD   On: 01/18/2020 10:50   IR THORACENTESIS ASP PLEURAL SPACE W/IMG GUIDE  Result Date: 12/26/2019 INDICATION: Patient with history of CHF, now with right pleural effusion. Request is made for diagnostic and therapeutic thoracentesis. EXAM: ULTRASOUND GUIDED DIAGNOSTIC AND THERAPEUTIC RIGHT THORACENTESIS MEDICATIONS: 10 mL 1% lidocaine COMPLICATIONS: None immediate. PROCEDURE: An ultrasound guided thoracentesis was thoroughly discussed with the patient and questions answered. The benefits, risks, alternatives and complications were also discussed. The patient  understands and wishes to proceed with the procedure. Written consent was obtained. Ultrasound was performed to localize and mark an adequate pocket of fluid in the right chest. The area was then prepped and draped in the normal sterile fashion. 1% Lidocaine was used for local anesthesia. Under ultrasound guidance a 6 Fr Safe-T-Centesis catheter was introduced. Thoracentesis was performed. The catheter was removed and a dressing applied. FINDINGS: A total of approximately 700 mL of amber fluid was removed. Samples were sent to the laboratory as requested by the clinical team. IMPRESSION: Successful ultrasound guided diagnostic and therapeutic right thoracentesis yielding 700 mL of pleural fluid. Read by: Brynda Greathouse PA-C No pneumothorax on follow-up radiograph. Electronically Signed   By: Lucrezia Europe M.D.   On: 12/26/2019 15:47      Subjective: Patient seen and examined at bedside, resting comfortably.  Ready for discharge home this morning.  Awaiting oxygen to be set up at home.  No other complaints at this time.  Denies headache, no dizziness, no chest pain, no palpitation, no shortness of breath, no abdominal pain.  Discharge Exam: Vitals:  01/23/20 0023 01/23/20 0607  BP: (!) 128/56 (!) 141/77  Pulse: 70 69  Resp: 17 18  Temp: 98 F (36.7 C) 98.5 F (36.9 C)  SpO2: 98% 99%   Vitals:   01/22/20 1132 01/22/20 1646 01/23/20 0023 01/23/20 0607  BP: 122/65 138/69 (!) 128/56 (!) 141/77  Pulse: 70 69 70 69  Resp: 16 16 17 18   Temp: 98.2 F (36.8 C) 98.5 F (36.9 C) 98 F (36.7 C) 98.5 F (36.9 C)  TempSrc: Oral Oral Oral Oral  SpO2: 98% 100% 98% 99%  Weight:    89.1 kg  Height:        General: Pt is alert, awake, not in acute distress Cardiovascular: RRR, S1/S2 +, no rubs, no gallops Respiratory: Breath sounds slightly decreased bilateral bases, no wheezing/crackles, normal respiratory effort, on 3 L nasal cannula with SPO2 96% Abdominal: Soft, NT, ND, bowel sounds  + Extremities: no edema, no cyanosis    The results of significant diagnostics from this hospitalization (including imaging, microbiology, ancillary and laboratory) are listed below for reference.     Microbiology: Recent Results (from the past 240 hour(s))  Resp Panel by RT PCR (RSV, Flu A&B, Covid) - Nasopharyngeal Swab     Status: None   Collection Time: 01/17/20  9:19 PM   Specimen: Nasopharyngeal Swab  Result Value Ref Range Status   SARS Coronavirus 2 by RT PCR NEGATIVE NEGATIVE Final    Comment: (NOTE) SARS-CoV-2 target nucleic acids are NOT DETECTED.  The SARS-CoV-2 RNA is generally detectable in upper respiratoy specimens during the acute phase of infection. The lowest concentration of SARS-CoV-2 viral copies this assay can detect is 131 copies/mL. A negative result does not preclude SARS-Cov-2 infection and should not be used as the sole basis for treatment or other patient management decisions. A negative result may occur with  improper specimen collection/handling, submission of specimen other than nasopharyngeal swab, presence of viral mutation(s) within the areas targeted by this assay, and inadequate number of viral copies (<131 copies/mL). A negative result must be combined with clinical observations, patient history, and epidemiological information. The expected result is Negative.  Fact Sheet for Patients:  PinkCheek.be  Fact Sheet for Healthcare Providers:  GravelBags.it  This test is no t yet approved or cleared by the [place] FDA and  has been authorized for detection and/or diagnosis of SARS-CoV-2 by FDA under an Emergency Use Authorization (EUA). This EUA will remain  in effect (meaning this test can be used) for the duration of the COVID-19 declaration under Section 564(b)(1) of the Act, 21 U.S.C. section 360bbb-3(b)(1), unless the authorization is terminated or revoked sooner.      Influenza A by PCR NEGATIVE NEGATIVE Final   Influenza B by PCR NEGATIVE NEGATIVE Final    Comment: (NOTE) The Xpert Xpress SARS-CoV-2/FLU/RSV assay is intended as an aid in  the diagnosis of influenza from Nasopharyngeal swab specimens and  should not be used as a sole basis for treatment. Nasal washings and  aspirates are unacceptable for Xpert Xpress SARS-CoV-2/FLU/RSV  testing.  Fact Sheet for Patients: PinkCheek.be  Fact Sheet for Healthcare Providers: GravelBags.it  This test is not yet approved or cleared by the [place] FDA and  has been authorized for detection and/or diagnosis of SARS-CoV-2 by  FDA under an Emergency Use Authorization (EUA). This EUA will remain  in effect (meaning this test can be used) for the duration of the  Covid-19 declaration under Section 564(b)(1) of the Act, 21  U.S.C. section 360bbb-3(b)(1), unless the authorization is  terminated or revoked.    Respiratory Syncytial Virus by PCR NEGATIVE NEGATIVE Final    Comment: (NOTE) Fact Sheet for Patients: PinkCheek.be  Fact Sheet for Healthcare Providers: GravelBags.it  This test is not yet approved or cleared by the [place] FDA and  has been authorized for detection and/or diagnosis of SARS-CoV-2 by  FDA under an Emergency Use Authorization (EUA). This EUA will remain  in effect (meaning this test can be used) for the duration of the  COVID-19 declaration under Section 564(b)(1) of the Act, 21 U.S.C.  section 360bbb-3(b)(1), unless the authorization is terminated or  revoked. Performed at [facility], [place] [address], [place], [place] [address]      Labs: BNP (last 3 results) Recent Labs    10/28/19 1527 12/23/19 0636  BNP 745.1* 790.2*   Basic Metabolic Panel: Recent Labs  Lab 01/19/20 0934 01/20/20 0536 01/21/20 0326 01/22/20 0025 01/23/20 0229  NA  141 136 139 137 137  K 4.2 4.0 4.5 4.3 4.3  CL 105 101 99 96* 96*  CO2 22 27 27 30 30   GLUCOSE 158* 174* 231* 197* 218*  BUN 64* 65* 66* 72* 76*  CREATININE 3.60* 3.49* 3.53* 3.53* 4.01*  CALCIUM 7.3* 7.3* 8.0* 7.7* 7.4*  PHOS  --  6.2* 6.1* 6.5* 6.4*   Liver Function Tests: Recent Labs  Lab 01/21/20 0326 01/22/20 0025 01/23/20 0229  ALBUMIN 3.3* 3.1* 3.1*   No results for input(s): LIPASE, AMYLASE in the last 168 hours. No results for input(s): AMMONIA in the last 168 hours. CBC: Recent Labs  Lab 01/17/20 1718  WBC 7.0  HGB 11.1*  HCT 37.1*  MCV 90.7  PLT 271   Cardiac Enzymes: No results for input(s): CKTOTAL, CKMB, CKMBINDEX, TROPONINI in the last 168 hours. BNP: Invalid input(s): POCBNP CBG: Recent Labs  Lab 01/22/20 0631 01/22/20 1127 01/22/20 1635 01/22/20 2114 01/23/20 0556  GLUCAP 220* 97 239* 147* 179*   D-Dimer No results for input(s): DDIMER in the last 72 hours. Hgb A1c No results for input(s): HGBA1C in the last 72 hours. Lipid Profile No results for input(s): CHOL, HDL, LDLCALC, TRIG, CHOLHDL, LDLDIRECT in the last 72 hours. Thyroid function studies No results for input(s): TSH, T4TOTAL, T3FREE, THYROIDAB in the last 72 hours.  Invalid input(s): FREET3 Anemia work up No results for input(s): VITAMINB12, FOLATE, FERRITIN, TIBC, IRON, RETICCTPCT in the last 72 hours. Urinalysis    Component Value Date/Time   COLORURINE YELLOW 12/28/2019 1850   APPEARANCEUR CLEAR 12/28/2019 1850   LABSPEC 1.014 12/28/2019 1850   PHURINE 5.0 12/28/2019 1850   GLUCOSEU NEGATIVE 12/28/2019 1850   HGBUR NEGATIVE 12/28/2019 1850   BILIRUBINUR NEGATIVE 12/28/2019 1850   KETONESUR NEGATIVE 12/28/2019 1850   PROTEINUR 30 (A) 12/28/2019 1850   UROBILINOGEN 0.2 06/17/2010 1620   NITRITE NEGATIVE 12/28/2019 1850   LEUKOCYTESUR NEGATIVE 12/28/2019 1850   Sepsis Labs Invalid input(s): PROCALCITONIN,  WBC,  LACTICIDVEN Microbiology Recent Results (from the past  240 hour(s))  Resp Panel by RT PCR (RSV, Flu A&B, Covid) - Nasopharyngeal Swab     Status: None   Collection Time: 01/17/20  9:19 PM   Specimen: Nasopharyngeal Swab  Result Value Ref Range Status   SARS Coronavirus 2 by RT PCR NEGATIVE NEGATIVE Final    Comment: (NOTE) SARS-CoV-2 target nucleic acids are NOT DETECTED.  The SARS-CoV-2 RNA is generally detectable in upper respiratoy specimens during the acute phase of infection.  The lowest concentration of SARS-CoV-2 viral copies this assay can detect is 131 copies/mL. A negative result does not preclude SARS-Cov-2 infection and should not be used as the sole basis for treatment or other patient management decisions. A negative result may occur with  improper specimen collection/handling, submission of specimen other than nasopharyngeal swab, presence of viral mutation(s) within the areas targeted by this assay, and inadequate number of viral copies (<131 copies/mL). A negative result must be combined with clinical observations, patient history, and epidemiological information. The expected result is Negative.  Fact Sheet for Patients:  PinkCheek.be  Fact Sheet for Healthcare Providers:  GravelBags.it  This test is no t yet approved or cleared by the [place] FDA and  has been authorized for detection and/or diagnosis of SARS-CoV-2 by FDA under an Emergency Use Authorization (EUA). This EUA will remain  in effect (meaning this test can be used) for the duration of the COVID-19 declaration under Section 564(b)(1) of the Act, 21 U.S.C. section 360bbb-3(b)(1), unless the authorization is terminated or revoked sooner.     Influenza A by PCR NEGATIVE NEGATIVE Final   Influenza B by PCR NEGATIVE NEGATIVE Final    Comment: (NOTE) The Xpert Xpress SARS-CoV-2/FLU/RSV assay is intended as an aid in  the diagnosis of influenza from Nasopharyngeal swab specimens and  should not  be used as a sole basis for treatment. Nasal washings and  aspirates are unacceptable for Xpert Xpress SARS-CoV-2/FLU/RSV  testing.  Fact Sheet for Patients: PinkCheek.be  Fact Sheet for Healthcare Providers: GravelBags.it  This test is not yet approved or cleared by the [place] FDA and  has been authorized for detection and/or diagnosis of SARS-CoV-2 by  FDA under an Emergency Use Authorization (EUA). This EUA will remain  in effect (meaning this test can be used) for the duration of the  Covid-19 declaration under Section 564(b)(1) of the Act, 21  U.S.C. section 360bbb-3(b)(1), unless the authorization is  terminated or revoked.    Respiratory Syncytial Virus by PCR NEGATIVE NEGATIVE Final    Comment: (NOTE) Fact Sheet for Patients: PinkCheek.be  Fact Sheet for Healthcare Providers: GravelBags.it  This test is not yet approved or cleared by the [place] FDA and  has been authorized for detection and/or diagnosis of SARS-CoV-2 by  FDA under an Emergency Use Authorization (EUA). This EUA will remain  in effect (meaning this test can be used) for the duration of the  COVID-19 declaration under Section 564(b)(1) of the Act, 21 U.S.C.  section 360bbb-3(b)(1), unless the authorization is terminated or  revoked. Performed at [facility], [place] [address] [place], [place] [address]      Time coordinating discharge: Over 30 minutes  SIGNED:   Siana Panameno J [place] DO  [facility] 01/23/2020, 8:17 AM

## 2020-01-23 NOTE — Care Management Important Message (Signed)
Important Message  Patient Details  Name: Melvin Mcclure MRN: [id number] Date of Birth: [birth year]   Medicare Important Message Given:  Yes - Important Message mailed due to current National Emergency  Verbal consent obtained due to current National Emergency  Relationship to patient: Spouse/Significant Other Contact Name: Camdan Burdi Call Date: 01/23/20  Time: 9249 Phone: [phone] Outcome: Spoke with contact Important Message mailed to: Other (must enter comment) (declined copy of IM)    Brayah Urquilla P Jeromey Kruer 01/23/2020, 9:50 AM

## 2020-02-16 ENCOUNTER — Other Ambulatory Visit: Payer: Self-pay | Admitting: Cardiology

## 2020-02-16 MED ORDER — ISOSORBIDE MONONITRATE ER 60 MG PO TB24
60.0000 mg | ORAL_TABLET | Freq: Every evening | ORAL | 3 refills | Status: DC
Start: 2020-02-16 — End: 2021-03-05

## 2020-02-16 MED ORDER — CARVEDILOL 12.5 MG PO TABS
12.5000 mg | ORAL_TABLET | Freq: Two times a day (BID) | ORAL | 3 refills | Status: DC
Start: 2020-02-16 — End: 2021-01-15

## 2020-02-16 NOTE — Telephone Encounter (Signed)
Informed pt's wife that the requests for refills for the pt's coreg and isosorbide were sent to his local pharmacy.

## 2020-02-16 NOTE — Telephone Encounter (Signed)
*  STAT* If patient is at the pharmacy, call can be transferred to refill team.   1. Which medications need to be refilled? (please list name of each medication and dose if known) a new prescription for  Isosorbide- this was written while pt was in the hospital  2. Which pharmacy/location (including street and city if local pharmacy) is medication to be sent to? [facility] [address] [address] [place]  3. Do they need a 30 day or 90 day supply? 90 days and refills

## 2020-02-16 NOTE — Telephone Encounter (Signed)
*  STAT* If patient is at the pharmacy, call can be transferred to refill team.   1. Which medications need to be refilled? (please list name of each medication and dose if known)  carvedilol (COREG) 12.5 MG tablet  2. Which pharmacy/location (including street and city if local pharmacy) is medication to be sent to? [place] ([place] [place] [address]  3. Do they need a 30 day or 90 day supply? 90 day supply  Pt is completely out of medication

## 2020-02-25 NOTE — Progress Notes (Deleted)
Cardiology Office Note   Date:  02/25/2020   ID:  Melvin Mcclure, DOB 19-Dec-1950, MRN [id number]  PCP:  Melvin Ebbs, MD  Cardiologist:  Kirk Ruths, MD EP: None  No chief complaint on file.     History of Present Illness: Melvin Mcclure is a 69 y.o. male with a PMH of chronic diastolic CHF, HTN, HLD, DM type 2, anemia, and CKD stage IV who presents for post-hospital follow-up.   He was last evaluated by cardiology during an admission to the hospital from 01/17/20-01/23/20 for acute hypoxic respiratory failure 2/2 acute on chronic diastolic CHF and recurrent right pleural effusion. His hospital course was complicated by acute on chronic renal insufficiency stage IV, for which nephrology followed to assist with diuresis. He was diuresed with IV lasix and transitioned to torsemide 40mg  BID with prn metolazone up to 2x weekly at discharge. Weight on the day of discharge was 89.1kg. His last echocardiogram 10/2019 showed EF 55-60%, moderate LVH, indeterminate LV diastolic function, no RWMA, and mild MR.  His last ischemic evaluation was a NST 07/2018 which showed a diaphragmatic attenuation defect but no ischemia. He also had a work-up for possible cardiac amyloid which was negative 12/2019.   He presents today for post-hospital follow-up.   1. Chronic diastolic CHF: Weight *** today, *** from 196lbs at discharge.  - Continue torsemide 40mg  BID with prn metolazone - Continue carvedilol - Continue to limit salt intake and monitor daily weights  2. HTN: BP *** today - Continue amlodipine, carvedilol, and hydralazine  3. HLD: LDL *** - Continue dietary modifications to lower choesterol  4. DM type 2: A1C 6.6 12/2019 - diet controlled - Continue dietary modifications to lower blood sugar  5. CKD stage IV: Cr 4.01 01/23/20 - Will repeat BMET today - Continue to limit nephrotoxic agents     Past Medical History:  Diagnosis Date  . Anemia in chronic kidney disease (CKD)   . CHF  (congestive heart failure) ([place]) 05/28/2018   dx 05/28/18  . Diabetes mellitus without complication ([place])   . Hyperlipidemia   . Hypertension   . Iron deficiency anemia   . Perforating neurotrophic ulcer of foot ([place])     Past Surgical History:  Procedure Laterality Date  . ACHILLES TENDON REPAIR    . CYST EXCISION    . IR THORACENTESIS ASP PLEURAL SPACE W/IMG GUIDE  12/26/2019  . IR THORACENTESIS ASP PLEURAL SPACE W/IMG GUIDE  01/18/2020     Current Outpatient Medications  Medication Sig Dispense Refill  . amLODipine (NORVASC) 10 MG tablet Take 10 mg by mouth daily.    Marland Kitchen aspirin 81 MG tablet Take 81 mg by mouth daily.    . calcitRIOL (ROCALTROL) 0.25 MCG capsule Take 0.25 mcg by mouth 3 (three) times a week. Monday, Wednesday, Friday    . calcium carbonate (TUMS - DOSED IN MG ELEMENTAL CALCIUM) 500 MG chewable tablet Chew 2 tablets by mouth daily.     . carvedilol (COREG) 12.5 MG tablet Take 1 tablet (12.5 mg total) by mouth 2 (two) times daily with a meal. 180 tablet 3  . diclofenac Sodium (VOLTAREN) 1 % GEL Apply 2 g topically 4 (four) times daily. (Patient taking differently: Apply 2 g topically 4 (four) times daily as needed (pain). )    . ferrous sulfate 325 (65 FE) MG tablet Take 325 mg by mouth daily with breakfast.    . gabapentin (NEURONTIN) 300 MG capsule Take 1 capsule (300 mg total) by  mouth 2 (two) times daily. 60 capsule 0  . hydrALAZINE (APRESOLINE) 50 MG tablet Take 1 tablet (50 mg total) by mouth every 8 (eight) hours. (Patient taking differently: Take 50 mg by mouth 3 (three) times daily. ) 90 tablet 0  . isosorbide mononitrate (IMDUR) 60 MG 24 hr tablet Take 1 tablet (60 mg total) by mouth every evening. 90 tablet 3  . meclizine (ANTIVERT) 25 MG tablet Take 1 tablet (25 mg total) by mouth 3 (three) times daily. 30 tablet 0  . metolazone (ZAROXOLYN) 5 MG tablet Take 1 tablet (5 mg total) by mouth as needed (Twice weekly as needed for shortness of breath, swelling,  weight gain). 10 tablet 0  . senna-docusate (SENOKOT-S) 8.6-50 MG tablet Take 1 tablet by mouth 2 (two) times daily as needed for mild constipation.     . torsemide (DEMADEX) 20 MG tablet Take 2 tablets (40 mg total) by mouth 2 (two) times daily. 360 tablet 0   No current facility-administered medications for this visit.    Allergies:   Patient has no known allergies.    Social History:  The patient  reports that he has never smoked. He has never used smokeless tobacco. He reports that he does not drink alcohol and does not use drugs.   Family History:  The patient's ***family history includes Diabetes Mellitus II in his mother; Hypertension in his father.    ROS:  Please see the history of present illness.   Otherwise, review of systems are positive for {NONE DEFAULTED:18576::"none"}.   All other systems are reviewed and negative.    PHYSICAL EXAM: VS:  There were no vitals taken for this visit. , BMI There is no height or weight on file to calculate BMI. GEN: Well nourished, well developed, in no acute distress HEENT: normal Neck: no JVD, carotid bruits, or masses Cardiac: ***RRR; no murmurs, rubs, or gallops,no edema  Respiratory:  clear to auscultation bilaterally, normal work of breathing GI: soft, nontender, nondistended, + BS MS: no deformity or atrophy Skin: warm and dry, no rash Neuro:  Strength and sensation are intact Psych: euthymic mood, full affect   EKG:  EKG {ACTION; IS/IS YIF:02774128} ordered today. The ekg ordered today demonstrates ***   Recent Labs: 12/23/2019: B Natriuretic Peptide 832.6 12/28/2019: ALT 17 01/17/2020: Hemoglobin 11.1; Platelets 271 01/23/2020: BUN 76; Creatinine, Ser 4.01; Potassium 4.3; Sodium 137    Lipid Panel No results found for: CHOL, TRIG, HDL, CHOLHDL, VLDL, LDLCALC, LDLDIRECT    Wt Readings from Last 3 Encounters:  01/23/20 196 lb 6.4 oz (89.1 kg)  01/17/20 210 lb (95.3 kg)  12/28/19 188 lb 1.6 oz (85.3 kg)      Other  studies Reviewed: Additional studies/ records that were reviewed today include:   Echocardiogram 10/2019: 1. Left ventricular ejection fraction, by estimation, is 55 to 60%. The  left ventricle has normal function. The left ventricle has no regional  wall motion abnormalities. There is moderate left ventricular hypertrophy.  Left ventricular diastolic  parameters are indeterminate.  2. Right ventricular systolic function is normal. The right ventricular  size is normal. Tricuspid regurgitation signal is inadequate for assessing  PA pressure.  3. The mitral valve is grossly normal. Mild mitral valve regurgitation.  4. The aortic valve is tricuspid, mildly calcified. Aortic valve  regurgitation is not visualized.  5. The inferior vena cava is normal in size with greater than 50%  respiratory variability, suggesting right atrial pressure of 3 mmHg.  6. Trivial to  small circumferential pericardial effusion.   NST 07/2018:  The left ventricular ejection fraction is normal (55-65%).  Nuclear stress EF: 57%. No wall motion abnormalities  There was no ST segment deviation noted during stress.  Defect 1: There is a medium defect of mild severity present in the basal inferior location. This appears secondary to diaphragmatic attenuation artifact  This is a low risk study. No significant evidence of ischemia identified.  Cardiac amyloidosis scan 12/2019: IMPRESSION: Visual and quantitative assessment (grade 1, H/CLL equal 1.1) are NOT suggestive of transthyretin amyloidosis.  ASSESSMENT AND PLAN:  1.  ***   Current medicines are reviewed at length with the patient today.  The patient {ACTIONS; HAS/DOES NOT HAVE:19233} concerns regarding medicines.  The following changes have been made:  {PLAN; NO CHANGE:13088:s}  Labs/ tests ordered today include: *** No orders of the defined types were placed in this encounter.    Disposition:   FU with *** in {gen number 5-45:625638} {Days  to years:10300}  Signed, Abigail Butts, PA-C  02/25/2020 11:50 PM

## 2020-02-27 ENCOUNTER — Ambulatory Visit: Payer: Medicare Other | Admitting: Medical

## 2020-02-27 DIAGNOSIS — E119 Type 2 diabetes mellitus without complications: Secondary | ICD-10-CM

## 2020-02-27 DIAGNOSIS — E785 Hyperlipidemia, unspecified: Secondary | ICD-10-CM

## 2020-02-27 DIAGNOSIS — I5032 Chronic diastolic (congestive) heart failure: Secondary | ICD-10-CM

## 2020-02-27 DIAGNOSIS — I10 Essential (primary) hypertension: Secondary | ICD-10-CM

## 2020-02-27 DIAGNOSIS — E1122 Type 2 diabetes mellitus with diabetic chronic kidney disease: Secondary | ICD-10-CM

## 2020-02-29 NOTE — Progress Notes (Signed)
Virtual Visit via Telephone Note   This visit type was conducted due to national recommendations for restrictions regarding the COVID-19 Pandemic (e.g. social distancing) in an effort to limit this patient's exposure and mitigate transmission in our community.  Due to his co-morbid illnesses, this patient is at least at moderate risk for complications without adequate follow up.  This format is felt to be most appropriate for this patient at this time.  The patient did not have access to video technology/had technical difficulties with video requiring transitioning to audio format only (telephone).  All issues noted in this document were discussed and addressed.  No physical exam could be performed with this format.  Please refer to the patient's chart for his  consent to telehealth for [facility].  Evaluation Performed:  Follow-up visit  This visit type was conducted due to national recommendations for restrictions regarding the COVID-19 Pandemic (e.g. social distancing).  This format is felt to be most appropriate for this patient at this time.  All issues noted in this document were discussed and addressed.  No physical exam was performed (except for noted visual exam findings with Video Visits).  Please refer to the patient's chart (MyChart message for video visits and phone note for telephone visits) for the patient's consent to telehealth for [place]  Date:  03/01/2020   ID:  Melvin Mcclure, DOB 09/17/1950, MRN [id number]  Patient Location:  [place] [address]   Provider location:     [address] Office [phone] Fax [phone]   PCP:  Nolene Ebbs, MD  Cardiologist:  Kirk Ruths, MD  Electrophysiologist:  None   Chief Complaint: Follow-up for chronic diastolic CHF  History of Present Illness:    Melvin Mcclure is a 69 y.o. male who presents via audio/video  conferencing for a telehealth visit today.  Patient verified DOB and address.  Mr. Melvin Mcclure has a PMH of chronic stage IV kidney disease, chronic diastolic CHF, essential hypertension.  Echocardiogram 6/21 showed normal LV function, moderate left ventricular hypertrophy, mild mitral regurgitation, trivial to small pericardial effusion.  Underwent PYP scan 8/21 which was not suggestive of amyloidosis.  He was admitted to the hospital 01/17/2020 until 01/23/2020 with acute on chronic diastolic CHF, acute hypoxic respiratory failure, and recurrent right pleural effusion.  He received IV diuresis and 8.4 l were diuresed.  He was transitioned to torsemide and metolazone to be taken twice weekly as needed.  He was receiving supplemental home O2 to 3 L via nasal cannula.  He was instructed to follow-up with nephrology and cardiology.  It was felt that he would ultimately need HD in the near future.  He is seen virtually today in follow-up and states he is feeling somewhat better today.  He has been having spells of vertigo which he manages with meclizine.  He indicated that yesterday he was very dizzy and unable to do much activity.  He states that overall he is doing much better on torsemide and he does not need to take any metolazone.  His weight has been stable and his blood pressure has remained in the 130s over 70s.  He hopes that he will be able to get back to playing tennis.  He has begun to maintain his oxygen.  Today he is using 2 L of oxygen.  He states at times he does not wear it but has been wearing it at night and  was sleeping consistently.  He met with nephrology yesterday who indicated they did not feel he would need HD at this time.  I will have him continue to increase his physical activity as tolerated, follow a low-sodium heart healthy diet, and follow-up in 3 months.  Today he denies chest pain, increased shortness of breath, lower extremity edema, fatigue, palpitations, melena, hematuria, hemoptysis,  diaphoresis, weakness, presyncope, syncope, orthopnea, and PND.   The patient does not symptoms concerning for COVID-19 infection (fever, chills, cough, or new SHORTNESS OF BREATH).    Prior CV studies:   The following studies were reviewed today:  EKG 01/23/2020 Normal sinus rhythm left atrial enlargement right bundle branch block left anterior fascicular block 70 bpm  Echocardiogram 10/29/2019 IMPRESSIONS    1. Left ventricular ejection fraction, by estimation, is 55 to 60%. The  left ventricle has normal function. The left ventricle has no regional  wall motion abnormalities. There is moderate left ventricular hypertrophy.  Left ventricular diastolic  parameters are indeterminate.  2. Right ventricular systolic function is normal. The right ventricular  size is normal. Tricuspid regurgitation signal is inadequate for assessing  PA pressure.  3. The mitral valve is grossly normal. Mild mitral valve regurgitation.  4. The aortic valve is tricuspid, mildly calcified. Aortic valve  regurgitation is not visualized.  5. The inferior vena cava is normal in size with greater than 50%  respiratory variability, suggesting right atrial pressure of 3 mmHg.  6. Trivial to small circumferential pericardial effusion.  Nuclear stress test 07/07/2018  The left ventricular ejection fraction is normal (55-65%).  Nuclear stress EF: 57%. No wall motion abnormalities  There was no ST segment deviation noted during stress.  Defect 1: There is a medium defect of mild severity present in the basal inferior location. This appears secondary to diaphragmatic attenuation artifact  This is a low risk study. No significant evidence of ischemia identified.   Past Medical History:  Diagnosis Date  . Anemia in chronic kidney disease (CKD)   . CHF (congestive heart failure) ([place]) 05/28/2018   dx 05/28/18  . Diabetes mellitus without complication ([place])   . Hyperlipidemia   . Hypertension   .  Iron deficiency anemia   . Perforating neurotrophic ulcer of foot ([place])    Past Surgical History:  Procedure Laterality Date  . ACHILLES TENDON REPAIR    . CYST EXCISION    . IR THORACENTESIS ASP PLEURAL SPACE W/IMG GUIDE  12/26/2019  . IR THORACENTESIS ASP PLEURAL SPACE W/IMG GUIDE  01/18/2020     Current Meds  Medication Sig  . amLODipine (NORVASC) 10 MG tablet Take 10 mg by mouth daily.  Marland Kitchen aspirin 81 MG tablet Take 81 mg by mouth daily.  . calcitRIOL (ROCALTROL) 0.25 MCG capsule Take 0.25 mcg by mouth 3 (three) times a week. Pt takes 1 tablet daily  . calcium carbonate (TUMS - DOSED IN MG ELEMENTAL CALCIUM) 500 MG chewable tablet Chew 2 tablets by mouth daily.   . carvedilol (COREG) 12.5 MG tablet Take 1 tablet (12.5 mg total) by mouth 2 (two) times daily with a meal.  . diclofenac Sodium (VOLTAREN) 1 % GEL Apply 2 g topically 4 (four) times daily. (Patient taking differently: Apply 2 g topically 4 (four) times daily as needed (pain). )  . ferrous sulfate 325 (65 FE) MG tablet Take 325 mg by mouth daily with breakfast.  . gabapentin (NEURONTIN) 300 MG capsule Take 1 capsule (300 mg total) by mouth 2 (two) times daily.  Marland Kitchen  hydrALAZINE (APRESOLINE) 50 MG tablet Take 1 tablet (50 mg total) by mouth every 8 (eight) hours. (Patient taking differently: Take 50 mg by mouth 3 (three) times daily. Pt takes 1 tablet daily)  . isosorbide mononitrate (IMDUR) 60 MG 24 hr tablet Take 1 tablet (60 mg total) by mouth every evening.  . meclizine (ANTIVERT) 25 MG tablet Take 1 tablet (25 mg total) by mouth 3 (three) times daily.  . metolazone (ZAROXOLYN) 5 MG tablet Take 1 tablet (5 mg total) by mouth as needed (Twice weekly as needed for shortness of breath, swelling, weight gain).  Marland Kitchen senna-docusate (SENOKOT-S) 8.6-50 MG tablet Take 1 tablet by mouth 2 (two) times daily as needed for mild constipation.   . torsemide (DEMADEX) 20 MG tablet Take 2 tablets (40 mg total) by mouth 2 (two) times daily.      Allergies:   Patient has no known allergies.   Social History   Tobacco Use  . Smoking status: Never Smoker  . Smokeless tobacco: Never Used  Vaping Use  . Vaping Use: Never used  Substance Use Topics  . Alcohol use: No    Alcohol/week: 0.0 standard drinks  . Drug use: No     Family Hx: The patient's family history includes Diabetes Mellitus II in his mother; Hypertension in his father.  ROS:   Please see the history of present illness.     All other systems reviewed and are negative.   Labs/Other Tests and Data Reviewed:    Recent Labs: 12/23/2019: B Natriuretic Peptide 832.6 12/28/2019: ALT 17 01/17/2020: Hemoglobin 11.1; Platelets 271 01/23/2020: BUN 76; Creatinine, Ser 4.01; Potassium 4.3; Sodium 137   Recent Lipid Panel No results found for: CHOL, TRIG, HDL, CHOLHDL, LDLCALC, LDLDIRECT  Wt Readings from Last 3 Encounters:  03/01/20 199 lb (90.3 kg)  01/23/20 196 lb 6.4 oz (89.1 kg)  01/17/20 210 lb (95.3 kg)     Exam:    Vital Signs:  BP 130/70   Ht _0  (1.778 m)   Wt 199 lb (90.3 kg)   BMI 28.55 kg/m    Well nourished, well developed male in no  acute distress.   ASSESSMENT & PLAN:    1.   Chronic diastolic CHF-reports he is euvolemic today.  Weight today 199 pounds.  Discharge weight 196.4 pounds.  Previously felt that his worsening renal function was major contributor to his fluid volume excess. Continue carvedilol, hydralazine, metolazone, torsemide, Heart healthy low-sodium diet-salty 6 given Daily weights-reviewed instructions for metolazone. Increase physical activity as tolerated  Essential hypertension-BP today 130/70.  Well-controlled at home. Continue amlodipine, Imdur, carvedilol, hydralazine, torsemide, Heart healthy low-sodium diet-salty 6 given Increase physical activity as tolerated  Acute on chronic stage IV CKD-creatinine 4.01 on 01/23/2020.  During last hospitalization it was felt that he would need to be transitioned to HD in  the near future. Follows with nephrology.  Disposition: Follow-up with Dr. Stanford Breed or me in 3 months.  COVID-19 Education: The signs and symptoms of COVID-19 were discussed with the patient and how to seek care for testing (follow up with PCP or arrange E-visit).  The importance of social distancing was discussed today.  Patient Risk:   After full review of this patients clinical status, I feel that they are at least moderate risk at this time.  Time:   Today, I have spent 14 minutes with the patient with telehealth technology discussing medications, physical activity, CHF.  I spent greater than 20 minutes reviewing the patient's previous  cardiac notes, cardiac testing, and past medical history.   Medication Adjustments/Labs and Tests Ordered: Current medicines are reviewed at length with the patient today.  Concerns regarding medicines are outlined above.   Tests Ordered: No orders of the defined types were placed in this encounter.  Medication Changes: No orders of the defined types were placed in this encounter.   Disposition:  in 3 month(s)  Signed, Jossie Ng. Elizer Bostic NP-C    12/07/2018 11:58 AM    [address] Office [phone] Fax [phone]

## 2020-03-01 ENCOUNTER — Encounter: Payer: Self-pay | Admitting: General Practice

## 2020-03-01 ENCOUNTER — Telehealth: Payer: Self-pay

## 2020-03-01 ENCOUNTER — Telehealth (INDEPENDENT_AMBULATORY_CARE_PROVIDER_SITE_OTHER): Payer: Medicare Other | Admitting: General Practice

## 2020-03-01 VITALS — BP 130/70 | Ht 70.0 in | Wt 199.0 lb

## 2020-03-01 DIAGNOSIS — E1122 Type 2 diabetes mellitus with diabetic chronic kidney disease: Secondary | ICD-10-CM

## 2020-03-01 DIAGNOSIS — I5031 Acute diastolic (congestive) heart failure: Secondary | ICD-10-CM

## 2020-03-01 DIAGNOSIS — N184 Chronic kidney disease, stage 4 (severe): Secondary | ICD-10-CM

## 2020-03-01 DIAGNOSIS — I10 Essential (primary) hypertension: Secondary | ICD-10-CM

## 2020-03-01 NOTE — Telephone Encounter (Signed)
Patient said he missed call - has virtual appt at 845. Was in chart looking for any notes/reasons to call prior to appointment.

## 2020-03-01 NOTE — Patient Instructions (Addendum)
Medication Instructions:  The current medical regimen is effective;  continue present plan and medications as directed. Please refer to the Current Medication list given to you today.  *If you need a refill on your cardiac medications before your next appointment, please call your pharmacy*  Lab Work:   Testing/Procedures:  NONE    NONE  Special Instructions TAKE AND LOG YOUR DAILY WEIGHTS; PLEASE CALL IF YOUR WT >3lbs/DAY OR 5lbs/WEEK [phone]  OUR FAX NUMBER IS [phone] TO HAVE LABS FAXED  PLEASE READ AND FOLLOW SALTY 6-ATTACHED  PLEASE INCREASE PHYSICAL ACTIVITY AS TOLERATED  Follow-Up: Your next appointment:  3 month(s) VIRTUAL with Kirk Ruths, MD 06-01-2020 @ 9AM  At [facility], you and your health needs are our priority.  As part of our continuing mission to provide you with exceptional heart care, we have created designated Provider Care Teams.  These Care Teams include your primary Cardiologist (physician) and Advanced Practice Providers (APPs -  Physician Assistants and Nurse Practitioners) who all work together to provide you with the care you need, when you need it.

## 2020-04-12 ENCOUNTER — Ambulatory Visit (INDEPENDENT_AMBULATORY_CARE_PROVIDER_SITE_OTHER): Payer: Medicare Other | Admitting: Internal Medicine

## 2020-04-12 ENCOUNTER — Other Ambulatory Visit: Payer: Self-pay

## 2020-04-12 ENCOUNTER — Ambulatory Visit (INDEPENDENT_AMBULATORY_CARE_PROVIDER_SITE_OTHER): Payer: Medicare Other

## 2020-04-12 ENCOUNTER — Encounter: Payer: Self-pay | Admitting: Internal Medicine

## 2020-04-12 DIAGNOSIS — J90 Pleural effusion, not elsewhere classified: Secondary | ICD-10-CM | POA: Diagnosis not present

## 2020-04-12 NOTE — Patient Instructions (Addendum)
Please remember to go to the x-ray department  for your tests - we will call you with the results when they are available  And set up another thoracentesis if indicated    Follow will be arrange p thoracentesis

## 2020-04-12 NOTE — Progress Notes (Signed)
Melvin Mcclure, male    DOB: January 19, 1951    MRN: [id number]   Brief patient profile:  46 yobm played HS [employer] and worked as Engineer, structural with new onset sob fall 2019 dx chf /cri and placed on 02 p last admit   Admit date: 01/17/2020 Discharge date: 01/23/2020  Admitted From: Home Disposition: Home  Recommendations for Outpatient Follow-up:  1. Follow up with PCP in 1-2 weeks 2. Follow-up with nephrology, Dr. Royce Macadamia as scheduled 3. Follow-up with cardiology as scheduled 4. Discharged on increased torsemide 40 mg p.o. twice daily with metolazone to use twice weekly as needed for increased shortness of breath, edema, or weight gain 5. Discharged on supplemental oxygen 3 L per nasal cannula 6. Please obtain BMP in one week 7. Please follow up on the following pending results:  Home Health: No Equipment/Devices: Oxygen, 3 L per nasal cannula  Discharge Condition: Stable CODE STATUS: Full code Diet recommendation: Renal/low-salt diet  History of present illness:  Melvin Mcclure a 69 year old male with past medical history notable for CKD stage IV, diastolic congestive heart failure, essential hypertension, type 2 diabetes mellitus with recurrent hospitalizations who was sent in from his cardiologist office for shortness of breath after being found to have recurrent right pleural effusion with associated hypoxia. In the ED, SPO2 was noted to be in the 80s on room air, and in the mid 90s on 2 L nasal cannula. Nephrology and cardiology were consulted. TRH consulted for admission.  Hospital course:  Acute hypoxic respiratory failure, present on admission Recurrent right pleural effusion Chronic diastolic congestive heart failure, decompensated Patient presenting from cardiology office with shortness of breath and found to be hypoxic with SPO2 in the low 80s on room air. Patient was noted to have a recurrent right pleural effusion and underwent thoracentesis on 01/18/2020  with 1.75 L removed . Volume overload is complicated by his diastolic heart dysfunction as well as his severe kidney disease.  Nephrology and cardiology were consulted and followed during hospital course.  Patient was started on aggressive IV diuresis with furosemide with good output and was net negative 8.4 L during the hospitalization.  Patient was then transitioned to torsemide at a higher dose of 40 mg p.o. twice daily; which he will continue outpatient in addition to metolazone 5 mg p.o. twice weekly as needed for increased shortness of breath, weight gain, edema.  Patient will need home supplemental oxygen at 3 L per nasal cannula with exertion.  Will need close outpatient follow-up with both cardiology and nephrology following discharge.  Ultimately, patient will likely need hemodialysis in the near future given his recurrent hospitalizations and his difficulty managing his overall volume with diuretics given his severe renal insufficiency.  Acute renal failure on CKD stage IV Nephrology following as above Bridge assistance, follows outpatient with Dr. Royce Macadamia.  Increased home torsemide to 40 mg p.o. twice daily with occasional twice weekly metolazone as above.  Given his recurrent hospitalizations as above with difficulty managing his volume overload with his renal insufficiency, likely will need HD in the near future.  Outpatient follow-up with Dr. Royce Macadamia.  Type 2 diabetes mellitus Hemoglobin A1c 6.6 on 12/29/2019, well controlled. Diet controlled at home.  Essential hypertension Continue home amlodipine 10 mg p.o. daily, Carvedilol 12.5 mg p.o. twice daily, Hydralazine 50 mg p.o. every 8 hours.  Continue aspirin.  Not on statin at home.  Follow-up with cardiology.  Discharge Diagnoses:  Principal Problem:   Acute respiratory failure with hypoxia (HCC) Active  Problems:   Acute diastolic CHF (congestive heart failure) (HCC)   DM2 (diabetes mellitus, type 2) ([place])   CKD stage 4 due to  type 2 diabetes mellitus ([place])   Hypertensive cardiomyopathy, with heart failure (HCC)   Recurrent right pleural effusion   HTN (hypertension)      History of Present Illness  04/12/2020  Pulmonary/ 1st office eval/Charnise Lovan  Chief Complaint  Patient presents with  . Consult    Shortness of breath at rest at around bedtime  Dyspnea:  Room to room  Cough: none  Sleep: bed is flat wedge x about 30 degrees  SABA use:  02 back on 2-4 lpm p 12/4 started needing 02 and zeroxyln x 2 and better on day of ov  No obvious day to day or daytime variability or assoc excess/ purulent sputum or mucus plugs or hemoptysis or cp or chest tightness, subjective wheeze or overt sinus or hb symptoms.   Sleeping now without nocturnal  or early am exacerbation  of respiratory  c/o's or need for noct saba. Also denies any obvious fluctuation of symptoms with weather or environmental changes or other aggravating or alleviating factors except as outlined above   No unusual exposure hx or h/o childhood pna/ asthma or knowledge of premature birth.  Current Allergies, Complete Past Medical History, Past Surgical History, Family History, and Social History were reviewed in [employer] record.  ROS  The following are not active complaints unless bolded Hoarseness, sore throat, dysphagia, dental problems, itching, sneezing,  nasal congestion or discharge of excess mucus or purulent secretions, ear ache,   fever, chills, sweats, unintended wt loss or wt gain, classically pleuritic or exertional cp,  orthopnea pnd or arm/hand swelling  or leg swelling, presyncope, palpitations, abdominal swelling pain, anorexia, nausea, vomiting, diarrhea  or change in bowel habits or change in bladder habits, change in stools or change in urine, dysuria, hematuria,  rash, arthralgias, visual complaints, headache, numbness, weakness or ataxia or problems with walking or coordination,  change in mood or  Memory. Vertigo            Past Medical History:  Diagnosis Date  . Anemia in chronic kidney disease (CKD)   . CHF (congestive heart failure) ([place]) 05/28/2018   dx 05/28/18  . Diabetes mellitus without complication ([place])   . Hyperlipidemia   . Hypertension   . Iron deficiency anemia   . Perforating neurotrophic ulcer of foot ([place])     Outpatient Medications Prior to Visit  Medication Sig Dispense Refill  . amLODipine (NORVASC) 10 MG tablet Take 10 mg by mouth daily.    Marland Kitchen aspirin 81 MG tablet Take 81 mg by mouth daily.    . calcitRIOL (ROCALTROL) 0.25 MCG capsule Take 0.25 mcg by mouth daily. Pt takes 1 tablet daily    . calcium carbonate (TUMS - DOSED IN MG ELEMENTAL CALCIUM) 500 MG chewable tablet Chew 2 tablets by mouth daily.     . carvedilol (COREG) 12.5 MG tablet Take 1 tablet (12.5 mg total) by mouth 2 (two) times daily with a meal. 180 tablet 3  . diclofenac Sodium (VOLTAREN) 1 % GEL Apply 2 g topically 4 (four) times daily. (Patient taking differently: Apply 2 g topically 4 (four) times daily as needed (pain).)    . ferrous sulfate 325 (65 FE) MG tablet Take 325 mg by mouth daily with breakfast.    . gabapentin (NEURONTIN) 300 MG capsule Take 1 capsule (300 mg total) by mouth  2 (two) times daily. 60 capsule 0  . hydrALAZINE (APRESOLINE) 50 MG tablet Take 1 tablet (50 mg total) by mouth every 8 (eight) hours. (Patient taking differently: Take 50 mg by mouth 3 (three) times daily. Pt takes 1 tablet daily) 90 tablet 0  . isosorbide mononitrate (IMDUR) 60 MG 24 hr tablet Take 1 tablet (60 mg total) by mouth every evening. 90 tablet 3  . meclizine (ANTIVERT) 25 MG tablet Take 1 tablet (25 mg total) by mouth 3 (three) times daily. 30 tablet 0  . metolazone (ZAROXOLYN) 5 MG tablet Take 1 tablet (5 mg total) by mouth as needed (Twice weekly as needed for shortness of breath, swelling, weight gain). 10 tablet 0  . senna-docusate (SENOKOT-S) 8.6-50 MG tablet Take 1 tablet by mouth 2 (two) times daily as  needed for mild constipation.     . torsemide (DEMADEX) 20 MG tablet Take 2 tablets (40 mg total) by mouth 2 (two) times daily. 360 tablet 0      Objective:     BP 112/62 (BP Location: Right Arm, Cuff Size: Normal)   Pulse 60   Temp 98 F (36.7 C) (Other (Comment)) Comment (Src): wrist  Ht 5\' 10"  (1.778 m)   Wt 205 lb 9.6 oz (93.3 kg)   SpO2 94% Comment: room air  BMI 29.50 kg/m   SpO2: 94 % (room air)   Wc/ bound elderly somber  bm nad    HEENT : pt wearing mask not removed for exam due to covid -19 concerns.    NECK :  without JVD/Nodes/TM/ nl carotid upstrokes bilaterally   LUNGS: no acc muscle use,  Nl contour chest with dullness and decreased bs in R base with a few insp crackles as well bilaterally s wheeze and  without cough on insp or exp maneuvers   CV:  RRR  no s3 or murmur or increase in P2, and no edema   ABD:  Mod distended but soft and nontender with nl inspiratory excursion in the supine position. No bruits or organomegaly appreciated, bowel sounds nl  MS:    ext warm without deformities, calf tenderness, cyanosis or clubbing No obvious joint restrictions   SKIN: warm and dry without lesions    NEURO:  alert, approp, nl sensorium with  no motor or cerebellar deficits apparent.    CXR PA and Lateral:   04/12/2020 :    I personally reviewed images and  impression as follows:   Very similar to cxr on 01/17/20 prior to last t centesis with atx and effusion           Assessment   No problem-specific Assessment & Plan notes found for this encounter.     Christinia Gully, MD 04/12/2020

## 2020-04-14 ENCOUNTER — Other Ambulatory Visit (HOSPITAL_COMMUNITY)
Admission: RE | Admit: 2020-04-14 | Discharge: 2020-04-14 | Disposition: A | Discharge status: Home / Self Care | Payer: Medicare Other | Source: Ambulatory Visit | Attending: Internal Medicine | Admitting: Internal Medicine

## 2020-04-14 ENCOUNTER — Other Ambulatory Visit: Payer: Self-pay

## 2020-04-15 LAB — SARS CORONAVIRUS 2 (TAT 6-24 HRS): SARS Coronavirus 2: NEGATIVE

## 2020-04-16 ENCOUNTER — Ambulatory Visit (HOSPITAL_COMMUNITY)
Admission: RE | Admit: 2020-04-16 | Discharge: 2020-04-16 | Disposition: A | Discharge status: Home / Self Care | Payer: Medicare Other | Source: Ambulatory Visit | Attending: Internal Medicine | Admitting: Internal Medicine

## 2020-04-16 ENCOUNTER — Ambulatory Visit (HOSPITAL_COMMUNITY)
Admission: RE | Admit: 2020-04-16 | Discharge: 2020-04-16 | Disposition: A | Discharge status: Home / Self Care | Payer: Medicare Other | Source: Ambulatory Visit | Attending: Radiology | Admitting: Radiology

## 2020-04-16 ENCOUNTER — Other Ambulatory Visit: Payer: Self-pay

## 2020-04-16 DIAGNOSIS — N184 Chronic kidney disease, stage 4 (severe): Secondary | ICD-10-CM | POA: Insufficient documentation

## 2020-04-16 DIAGNOSIS — Z20822 Contact with and (suspected) exposure to covid-19: Secondary | ICD-10-CM | POA: Insufficient documentation

## 2020-04-16 DIAGNOSIS — Z9889 Other specified postprocedural states: Secondary | ICD-10-CM

## 2020-04-16 DIAGNOSIS — J90 Pleural effusion, not elsewhere classified: Secondary | ICD-10-CM | POA: Insufficient documentation

## 2020-04-16 DIAGNOSIS — I5032 Chronic diastolic (congestive) heart failure: Secondary | ICD-10-CM | POA: Insufficient documentation

## 2020-04-16 HISTORY — PX: THORACENTESIS: SHX235

## 2020-04-16 LAB — BODY FLUID CELL COUNT WITH DIFFERENTIAL
Lymphs, Fluid: 80 %
Monocyte-Macrophage-Serous Fluid: 19 % — ABNORMAL LOW (ref 50–90)
Neutrophil Count, Fluid: 1 % (ref 0–25)
Total Nucleated Cell Count, Fluid: 867 cu mm (ref 0–1000)

## 2020-04-16 LAB — ALBUMIN, PLEURAL OR PERITONEAL FLUID: Albumin, Fluid: 1.8 g/dL

## 2020-04-16 LAB — LACTATE DEHYDROGENASE, PLEURAL OR PERITONEAL FLUID: LD, Fluid: 69 U/L — ABNORMAL HIGH (ref 3–23)

## 2020-04-16 LAB — PROTEIN, PLEURAL OR PERITONEAL FLUID: Total protein, fluid: <3 g/dL

## 2020-04-16 LAB — GLUCOSE, PLEURAL OR PERITONEAL FLUID: Glucose, Fluid: 170 mg/dL

## 2020-04-16 MED ORDER — LIDOCAINE HCL 1 % IJ SOLN
INTRAMUSCULAR | Status: AC
  Filled 2020-04-16: qty 20

## 2020-04-16 NOTE — Procedures (Signed)
Ultrasound-guided diagnostic and therapeutic right thoracentesis performed yielding  2 liters of hazy, amber fluid. No immediate complications. Follow-up chest x-ray pending. A portion of the fluid was sent to the lab for preordered studies. EBL < 1 cc.

## 2020-04-17 ENCOUNTER — Inpatient Hospital Stay (HOSPITAL_COMMUNITY)
Admission: EM | Admit: 2020-04-17 | Discharge: 2020-04-21 | DRG: 193 | Disposition: A | Discharge status: Home / Self Care | Payer: Medicare Other | Attending: Internal Medicine | Admitting: Internal Medicine

## 2020-04-17 ENCOUNTER — Encounter (HOSPITAL_COMMUNITY): Payer: Self-pay | Admitting: Emergency Medicine

## 2020-04-17 ENCOUNTER — Emergency Department (HOSPITAL_COMMUNITY): Payer: Medicare Other

## 2020-04-17 ENCOUNTER — Encounter: Payer: Self-pay | Admitting: *Deleted

## 2020-04-17 ENCOUNTER — Telehealth: Payer: Self-pay | Admitting: Internal Medicine

## 2020-04-17 DIAGNOSIS — N184 Chronic kidney disease, stage 4 (severe): Secondary | ICD-10-CM | POA: Diagnosis present

## 2020-04-17 DIAGNOSIS — R0902 Hypoxemia: Secondary | ICD-10-CM | POA: Diagnosis not present

## 2020-04-17 DIAGNOSIS — I5032 Chronic diastolic (congestive) heart failure: Secondary | ICD-10-CM | POA: Diagnosis present

## 2020-04-17 DIAGNOSIS — Z87891 Personal history of nicotine dependence: Secondary | ICD-10-CM | POA: Diagnosis not present

## 2020-04-17 DIAGNOSIS — R0602 Shortness of breath: Secondary | ICD-10-CM

## 2020-04-17 DIAGNOSIS — J189 Pneumonia, unspecified organism: Principal | ICD-10-CM | POA: Diagnosis present

## 2020-04-17 DIAGNOSIS — N2581 Secondary hyperparathyroidism of renal origin: Secondary | ICD-10-CM | POA: Diagnosis present

## 2020-04-17 DIAGNOSIS — I130 Hypertensive heart and chronic kidney disease with heart failure and stage 1 through stage 4 chronic kidney disease, or unspecified chronic kidney disease: Secondary | ICD-10-CM | POA: Diagnosis present

## 2020-04-17 DIAGNOSIS — Z20822 Contact with and (suspected) exposure to covid-19: Secondary | ICD-10-CM | POA: Diagnosis present

## 2020-04-17 DIAGNOSIS — E1100 Type 2 diabetes mellitus with hyperosmolarity without nonketotic hyperglycemic-hyperosmolar coma (NKHHC): Secondary | ICD-10-CM | POA: Diagnosis not present

## 2020-04-17 DIAGNOSIS — N185 Chronic kidney disease, stage 5: Secondary | ICD-10-CM | POA: Diagnosis present

## 2020-04-17 DIAGNOSIS — E785 Hyperlipidemia, unspecified: Secondary | ICD-10-CM | POA: Diagnosis present

## 2020-04-17 DIAGNOSIS — G4700 Insomnia, unspecified: Secondary | ICD-10-CM | POA: Diagnosis not present

## 2020-04-17 DIAGNOSIS — D509 Iron deficiency anemia, unspecified: Secondary | ICD-10-CM | POA: Diagnosis present

## 2020-04-17 DIAGNOSIS — I43 Cardiomyopathy in diseases classified elsewhere: Secondary | ICD-10-CM | POA: Diagnosis present

## 2020-04-17 DIAGNOSIS — R410 Disorientation, unspecified: Secondary | ICD-10-CM | POA: Diagnosis not present

## 2020-04-17 DIAGNOSIS — Z8249 Family history of ischemic heart disease and other diseases of the circulatory system: Secondary | ICD-10-CM | POA: Diagnosis not present

## 2020-04-17 DIAGNOSIS — Z79899 Other long term (current) drug therapy: Secondary | ICD-10-CM

## 2020-04-17 DIAGNOSIS — D631 Anemia in chronic kidney disease: Secondary | ICD-10-CM | POA: Diagnosis present

## 2020-04-17 DIAGNOSIS — I452 Bifascicular block: Secondary | ICD-10-CM | POA: Diagnosis present

## 2020-04-17 DIAGNOSIS — Z833 Family history of diabetes mellitus: Secondary | ICD-10-CM | POA: Diagnosis not present

## 2020-04-17 DIAGNOSIS — R188 Other ascites: Secondary | ICD-10-CM

## 2020-04-17 DIAGNOSIS — Z9981 Dependence on supplemental oxygen: Secondary | ICD-10-CM

## 2020-04-17 DIAGNOSIS — I110 Hypertensive heart disease with heart failure: Secondary | ICD-10-CM | POA: Diagnosis not present

## 2020-04-17 DIAGNOSIS — Z7982 Long term (current) use of aspirin: Secondary | ICD-10-CM

## 2020-04-17 DIAGNOSIS — E1122 Type 2 diabetes mellitus with diabetic chronic kidney disease: Secondary | ICD-10-CM | POA: Diagnosis present

## 2020-04-17 DIAGNOSIS — Z09 Encounter for follow-up examination after completed treatment for conditions other than malignant neoplasm: Secondary | ICD-10-CM

## 2020-04-17 DIAGNOSIS — E119 Type 2 diabetes mellitus without complications: Secondary | ICD-10-CM

## 2020-04-17 DIAGNOSIS — J9621 Acute and chronic respiratory failure with hypoxia: Secondary | ICD-10-CM | POA: Diagnosis present

## 2020-04-17 DIAGNOSIS — N179 Acute kidney failure, unspecified: Secondary | ICD-10-CM | POA: Diagnosis present

## 2020-04-17 DIAGNOSIS — I10 Essential (primary) hypertension: Secondary | ICD-10-CM | POA: Diagnosis present

## 2020-04-17 DIAGNOSIS — I5033 Acute on chronic diastolic (congestive) heart failure: Secondary | ICD-10-CM | POA: Diagnosis present

## 2020-04-17 DIAGNOSIS — J9611 Chronic respiratory failure with hypoxia: Secondary | ICD-10-CM | POA: Diagnosis present

## 2020-04-17 DIAGNOSIS — J90 Pleural effusion, not elsewhere classified: Secondary | ICD-10-CM | POA: Diagnosis present

## 2020-04-17 LAB — BASIC METABOLIC PANEL
Anion gap: 17 — ABNORMAL HIGH (ref 5–15)
BUN: 103 mg/dL — ABNORMAL HIGH (ref 8–23)
CO2: 28 mmol/L (ref 22–32)
Calcium: 7.8 mg/dL — ABNORMAL LOW (ref 8.9–10.3)
Chloride: 95 mmol/L — ABNORMAL LOW (ref 98–111)
Creatinine, Ser: 4.18 mg/dL — ABNORMAL HIGH (ref 0.61–1.24)
GFR, Estimated: 15 mL/min — ABNORMAL LOW (ref >60)
Glucose, Bld: 186 mg/dL — ABNORMAL HIGH (ref 70–99)
Potassium: 3.7 mmol/L (ref 3.5–5.1)
Sodium: 140 mmol/L (ref 135–145)

## 2020-04-17 LAB — TROPONIN I (HIGH SENSITIVITY)
Troponin I (High Sensitivity): 11 ng/L (ref <18)
Troponin I (High Sensitivity): 11 ng/L (ref <18)

## 2020-04-17 LAB — RESP PANEL BY RT-PCR (FLU A&B, COVID) ARPGX2
Influenza A by PCR: NEGATIVE
Influenza B by PCR: NEGATIVE
SARS Coronavirus 2 by RT PCR: NEGATIVE

## 2020-04-17 LAB — CBC
HCT: 36.4 % — ABNORMAL LOW (ref 39.0–52.0)
Hemoglobin: 11.1 g/dL — ABNORMAL LOW (ref 13.0–17.0)
MCH: 26.3 pg (ref 26.0–34.0)
MCHC: 30.5 g/dL (ref 30.0–36.0)
MCV: 86.3 fL (ref 80.0–100.0)
Platelets: 255 10*3/uL (ref 150–400)
RBC: 4.22 MIL/uL (ref 4.22–5.81)
RDW: 15.4 % (ref 11.5–15.5)
WBC: 8.2 10*3/uL (ref 4.0–10.5)
nRBC: 0 % (ref 0.0–0.2)

## 2020-04-17 LAB — AMYLASE, PLEURAL OR PERITONEAL FLUID: Amylase, Fluid: 39 U/L

## 2020-04-17 LAB — BRAIN NATRIURETIC PEPTIDE: B Natriuretic Peptide: 480.6 pg/mL — ABNORMAL HIGH (ref 0.0–100.0)

## 2020-04-17 LAB — CBG MONITORING, ED: Glucose-Capillary: 212 mg/dL — ABNORMAL HIGH (ref 70–99)

## 2020-04-17 MED ORDER — SODIUM CHLORIDE 0.9 % IV SOLN
500.0000 mg | INTRAVENOUS | Status: DC
  Administered 2020-04-18 – 2020-04-20 (×4): 500 mg via INTRAVENOUS
  Filled 2020-04-17 (×7): qty 500

## 2020-04-17 MED ORDER — FUROSEMIDE 10 MG/ML IJ SOLN
120.0000 mg | Freq: Once | INTRAVENOUS | Status: AC
  Administered 2020-04-17: 18:00:00 120 mg via INTRAVENOUS
  Filled 2020-04-17: qty 2

## 2020-04-17 MED ORDER — SODIUM CHLORIDE 0.9 % IV SOLN
2.0000 g | INTRAVENOUS | Status: DC
  Administered 2020-04-17 – 2020-04-20 (×4): 2 g via INTRAVENOUS
  Filled 2020-04-17 (×2): qty 20
  Filled 2020-04-17: qty 2
  Filled 2020-04-17: qty 20
  Filled 2020-04-17: qty 2
  Filled 2020-04-17: qty 20

## 2020-04-17 MED ORDER — HEPARIN SODIUM (PORCINE) 5000 UNIT/ML IJ SOLN
5000.0000 [IU] | Freq: Three times a day (TID) | INTRAMUSCULAR | Status: DC
  Administered 2020-04-17 – 2020-04-21 (×10): 5000 [IU] via SUBCUTANEOUS
  Filled 2020-04-17 (×11): qty 1

## 2020-04-17 MED ORDER — ONDANSETRON HCL 4 MG PO TABS: 4.0000 mg | ORAL_TABLET | Freq: Four times a day (QID) | ORAL | Status: DC | PRN

## 2020-04-17 MED ORDER — ACETAMINOPHEN 650 MG RE SUPP: 650.0000 mg | Freq: Four times a day (QID) | RECTAL | Status: DC | PRN

## 2020-04-17 MED ORDER — ISOSORBIDE MONONITRATE ER 60 MG PO TB24
60.0000 mg | ORAL_TABLET | Freq: Every evening | ORAL | Status: DC
  Administered 2020-04-18 – 2020-04-20 (×4): 60 mg via ORAL
  Filled 2020-04-17 (×3): qty 1
  Filled 2020-04-17: qty 2

## 2020-04-17 MED ORDER — ASPIRIN EC 81 MG PO TBEC
81.0000 mg | DELAYED_RELEASE_TABLET | Freq: Every day | ORAL | Status: DC
  Administered 2020-04-18 – 2020-04-21 (×4): 81 mg via ORAL
  Filled 2020-04-17 (×4): qty 1

## 2020-04-17 MED ORDER — CALCIUM CARBONATE ANTACID 500 MG PO CHEW
2.0000 | CHEWABLE_TABLET | Freq: Every day | ORAL | Status: DC
  Administered 2020-04-18 – 2020-04-20 (×3): 400 mg via ORAL
  Filled 2020-04-17 (×3): qty 2

## 2020-04-17 MED ORDER — CARVEDILOL 12.5 MG PO TABS
12.5000 mg | ORAL_TABLET | Freq: Two times a day (BID) | ORAL | Status: DC
  Administered 2020-04-18 – 2020-04-21 (×7): 12.5 mg via ORAL
  Filled 2020-04-17 (×7): qty 1

## 2020-04-17 MED ORDER — INSULIN ASPART 100 UNIT/ML ~~LOC~~ SOLN
0.0000 [IU] | Freq: Three times a day (TID) | SUBCUTANEOUS | Status: DC
  Administered 2020-04-18: 13:00:00 2 [IU] via SUBCUTANEOUS
  Administered 2020-04-19: 09:00:00 1 [IU] via SUBCUTANEOUS
  Administered 2020-04-19: 13:00:00 2 [IU] via SUBCUTANEOUS
  Administered 2020-04-20 – 2020-04-21 (×3): 1 [IU] via SUBCUTANEOUS

## 2020-04-17 MED ORDER — HYDRALAZINE HCL 50 MG PO TABS
50.0000 mg | ORAL_TABLET | Freq: Three times a day (TID) | ORAL | Status: DC
  Administered 2020-04-18 – 2020-04-21 (×11): 50 mg via ORAL
  Filled 2020-04-17 (×11): qty 1

## 2020-04-17 MED ORDER — GABAPENTIN 300 MG PO CAPS
300.0000 mg | ORAL_CAPSULE | Freq: Two times a day (BID) | ORAL | Status: DC
  Administered 2020-04-18 – 2020-04-21 (×7): 300 mg via ORAL
  Filled 2020-04-17 (×7): qty 1

## 2020-04-17 MED ORDER — ONDANSETRON HCL 4 MG/2ML IJ SOLN: 4.0000 mg | Freq: Four times a day (QID) | INTRAMUSCULAR | Status: DC | PRN

## 2020-04-17 MED ORDER — ACETAMINOPHEN 325 MG PO TABS
650.0000 mg | ORAL_TABLET | Freq: Four times a day (QID) | ORAL | Status: DC | PRN
  Administered 2020-04-18 – 2020-04-20 (×2): 650 mg via ORAL
  Filled 2020-04-17 (×2): qty 2

## 2020-04-17 MED ORDER — SENNOSIDES-DOCUSATE SODIUM 8.6-50 MG PO TABS: 1.0000 | ORAL_TABLET | Freq: Every evening | ORAL | Status: DC | PRN

## 2020-04-17 MED ORDER — AMLODIPINE BESYLATE 10 MG PO TABS
10.0000 mg | ORAL_TABLET | Freq: Every day | ORAL | Status: DC
  Administered 2020-04-18 – 2020-04-21 (×4): 10 mg via ORAL
  Filled 2020-04-17 (×4): qty 1

## 2020-04-17 MED ORDER — FUROSEMIDE 10 MG/ML IJ SOLN
120.0000 mg | Freq: Two times a day (BID) | INTRAVENOUS | Status: DC
  Administered 2020-04-18 – 2020-04-19 (×3): 120 mg via INTRAVENOUS
  Filled 2020-04-17: qty 12
  Filled 2020-04-17: qty 10
  Filled 2020-04-17 (×2): qty 12

## 2020-04-17 MED ORDER — CALCITRIOL 0.25 MCG PO CAPS
0.2500 ug | ORAL_CAPSULE | Freq: Every day | ORAL | Status: DC
  Administered 2020-04-18 – 2020-04-21 (×4): 0.25 ug via ORAL
  Filled 2020-04-17 (×5): qty 1

## 2020-04-17 NOTE — Telephone Encounter (Signed)
Patient had a thoracentesis yesterday at [place] wife states that he started wheezing and coughing last night, productive cough with light yellow/white sputum. She would like any recommendations as she is trying to keep him out of the hospital.   Dr. Melvyn Novas please advise

## 2020-04-17 NOTE — ED Notes (Signed)
Pt given [place] sandwich,apple sauce, and cup of water.

## 2020-04-17 NOTE — Telephone Encounter (Signed)
Pt's wife [name] returned call & can be reached at [phone]

## 2020-04-17 NOTE — ED Notes (Signed)
MD at bedside. Pt resting in bed

## 2020-04-17 NOTE — Telephone Encounter (Signed)
Pt's wife, [name] is returning triage's call & can be reached at [phone]

## 2020-04-17 NOTE — ED Triage Notes (Signed)
Pt reports he had 2L of fluid drawn off yesterday, worsening sob today with O2 sat in high 70s at home, sat 81% on room air in triage.

## 2020-04-17 NOTE — Telephone Encounter (Signed)
Sorry to hear that - it's probably from the lung re expanding but I can't be sure over the phone and since he does not have a h/o wheezing nor has been shown how to use inhalers it's much better just to go to ER and let them give him a breathing treatment there and a cxr to make sure he's going to be ok

## 2020-04-17 NOTE — H&P (Signed)
History and Physical    Melvin Mcclure JXB:[id number] DOB: August 24, 1950 DOA: 04/17/2020  PCP: Nolene Ebbs, MD   Patient coming from: Home   Chief Complaint: Increased SOB, cough   HPI: Melvin Mcclure is a 69 y.o. male with medical history significant for CKD IV, HFpEF, type 2 diabetes mellitus, and hypertension, now presenting to the emergency department for evaluation of increased shortness of breath and cough.  Patient underwent right thoracentesis yesterday with 2 L of fluid removed, has had increased cough and shortness of breath since then, continuing to worsen overnight and throughout the day today.  Cough has been productive of thick sputum.  Patient did not have a fever at home and denies any chest pain.  He denies any leg swelling, notes that he typically accumulates fluid in his abdomen but has not noticed any abdominal swelling either.  His weight is down 5 to 10 pounds over the past couple weeks.  He has home oxygen that he uses on an as-needed basis only. He has increased fatigue and lethargy in the past few days without focal numbness or weakness.   ED Course: Upon arrival to the ED, patient is found to be afebrile, saturating 81% on room air, mildly tachypneic, and with stable blood pressure.  EKG features sinus rhythm with RBBB, LAFB, and LVH with repolarization abnormality.  Chest x-ray is notable for cardiomegaly, vascular congestion, probable mild pulmonary edema, hazy opacity throughout the right thorax which is suspicious for pleural effusion, and also increased density at the right base which could reflect pneumonia or atelectasis.  Chemistry panel features a glucose of 186, BUN 103, and creatinine 4.18.  CBC with slight normocytic anemia.  Troponin is normal x2.  BNP elevated 481.  Patient was started on supplemental oxygen in the emergency department, nephrology was contacted by the ED physician, and 120 mg IV Lasix was administered.  Review of Systems:  All other systems  reviewed and apart from HPI, are negative.  Past Medical History:  Diagnosis Date  . Anemia in chronic kidney disease (CKD)   . CHF (congestive heart failure) ([place]) 05/28/2018   dx 05/28/18  . Diabetes mellitus without complication ([place])   . Hyperlipidemia   . Hypertension   . Iron deficiency anemia   . Perforating neurotrophic ulcer of foot ([place])     Past Surgical History:  Procedure Laterality Date  . ACHILLES TENDON REPAIR    . CYST EXCISION    . IR THORACENTESIS ASP PLEURAL SPACE W/IMG GUIDE  12/26/2019  . IR THORACENTESIS ASP PLEURAL SPACE W/IMG GUIDE  01/18/2020    Social History:   reports that he has quit smoking. He has never used smokeless tobacco. He reports that he does not drink alcohol and does not use drugs.  No Known Allergies  Family History  Problem Relation Age of Onset  . Diabetes Mellitus II Mother   . Hypertension Father      Prior to Admission medications   Medication Sig Start Date End Date Taking? Authorizing Provider  amLODipine (NORVASC) 10 MG tablet Take 10 mg by mouth daily.   Yes [provider]  aspirin 81 MG tablet Take 81 mg by mouth daily.   Yes [provider]  calcitRIOL (ROCALTROL) 0.25 MCG capsule Take 0.25 mcg by mouth daily. Pt takes 1 tablet daily 10/18/19  Yes [provider]  calcium carbonate (TUMS - DOSED IN MG ELEMENTAL CALCIUM) 500 MG chewable tablet Chew 2 tablets by mouth daily.    Yes [provider]  carvedilol (COREG) 12.5 MG tablet Take 1 tablet (12.5 mg total) by mouth 2 (two) times daily with a meal. 02/16/20  Yes Crenshaw, Denice Bors, MD  diclofenac Sodium (VOLTAREN) 1 % GEL Apply 2 g topically 4 (four) times daily. Patient taking differently: Apply 2 g topically 4 (four) times daily as needed (pain). 12/31/19  Yes Vann, Jessica U, DO  ferrous sulfate 325 (65 FE) MG tablet Take 325 mg by mouth daily with breakfast.   Yes [provider]  gabapentin (NEURONTIN) 300 MG capsule Take  1 capsule (300 mg total) by mouth 2 (two) times daily. 12/31/19  Yes Eulogio Bear U, DO  hydrALAZINE (APRESOLINE) 50 MG tablet Take 1 tablet (50 mg total) by mouth every 8 (eight) hours. Patient taking differently: Take 50 mg by mouth 3 (three) times daily. Pt takes 1 tablet daily 06/04/18  Yes Dessa Phi, DO  hydrOXYzine (ATARAX/VISTARIL) 25 MG tablet Take 25 mg by mouth 2 (two) times daily as needed for anxiety. 04/13/20  Yes [provider]  isosorbide mononitrate (IMDUR) 60 MG 24 hr tablet Take 1 tablet (60 mg total) by mouth every evening. 02/16/20  Yes Lelon Perla, MD  meclizine (ANTIVERT) 25 MG tablet Take 1 tablet (25 mg total) by mouth 3 (three) times daily. 12/31/19  Yes Geradine Girt, DO  metolazone (ZAROXOLYN) 5 MG tablet Take 1 tablet (5 mg total) by mouth as needed (Twice weekly as needed for shortness of breath, swelling, weight gain). 01/23/20  Yes [place] Eric J, DO  senna-docusate (SENOKOT-S) 8.6-50 MG tablet Take 1 tablet by mouth 2 (two) times daily as needed for mild constipation.    Yes [provider]  torsemide (DEMADEX) 20 MG tablet Take 2 tablets (40 mg total) by mouth 2 (two) times daily. 01/23/20 04/22/20 Yes [place] Eric J, DO    Physical Exam: Vitals:   04/17/20 1930 04/17/20 1945 04/17/20 2015 04/17/20 2045  BP: 137/76 138/77 138/85 116/90  Pulse: 64 64 66 67  Resp: 16 19 17  (!) 22  Temp:      SpO2: 98% 97% 96% 95%  Weight:      Height:        Constitutional: NAD, calm  Eyes: PERTLA, lids and conjunctivae normal ENMT: Mucous membranes are moist. Posterior pharynx clear of any exudate or lesions.   Neck: normal, supple, no masses, no thyromegaly Respiratory: Diminished on right, dyspnea with speech. No pallor or cyanosis.  Cardiovascular: S1 & S2 heard, regular rate and rhythm. No extremity edema.  Abdomen: No distension, no tenderness, soft. Bowel sounds active.  Musculoskeletal: no clubbing / cyanosis. No joint deformity upper  and lower extremities.   Skin: no significant rashes, lesions, ulcers. Warm, dry, well-perfused. Neurologic: CN 2-12 grossly intact. Sensation intact. Moving all extremities.  Psychiatric: Drowsy, oriented to person, place, and situation. Very pleasant and cooperative.    Labs and Imaging on Admission: I have personally reviewed following labs and imaging studies  CBC: Recent Labs  Lab 04/17/20 1453  WBC 8.2  HGB 11.1*  HCT 36.4*  MCV 86.3  PLT 213   Basic Metabolic Panel: Recent Labs  Lab 04/17/20 1453  NA 140  K 3.7  CL 95*  CO2 28  GLUCOSE 186*  BUN 103*  CREATININE 4.18*  CALCIUM 7.8*   GFR: Estimated Creatinine Clearance: 17 mL/min (A) (by C-G formula based on SCr of 4.18 mg/dL (H)). Liver Function Tests: No results for input(s): AST, ALT, ALKPHOS, BILITOT, PROT, ALBUMIN  in the last 168 hours. No results for input(s): LIPASE, AMYLASE in the last 168 hours. No results for input(s): AMMONIA in the last 168 hours. Coagulation Profile: No results for input(s): INR, PROTIME in the last 168 hours. Cardiac Enzymes: No results for input(s): CKTOTAL, CKMB, CKMBINDEX, TROPONINI in the last 168 hours. BNP (last 3 results) No results for input(s): PROBNP in the last 8760 hours. HbA1C: No results for input(s): HGBA1C in the last 72 hours. CBG: No results for input(s): GLUCAP in the last 168 hours. Lipid Profile: No results for input(s): CHOL, HDL, LDLCALC, TRIG, CHOLHDL, LDLDIRECT in the last 72 hours. Thyroid Function Tests: No results for input(s): TSH, T4TOTAL, FREET4, T3FREE, THYROIDAB in the last 72 hours. Anemia Panel: No results for input(s): VITAMINB12, FOLATE, FERRITIN, TIBC, IRON, RETICCTPCT in the last 72 hours. Urine analysis:    Component Value Date/Time   COLORURINE YELLOW 12/28/2019 1850   APPEARANCEUR CLEAR 12/28/2019 1850   LABSPEC 1.014 12/28/2019 1850   PHURINE 5.0 12/28/2019 1850   GLUCOSEU NEGATIVE 12/28/2019 1850   HGBUR NEGATIVE 12/28/2019  1850   BILIRUBINUR NEGATIVE 12/28/2019 1850   KETONESUR NEGATIVE 12/28/2019 1850   PROTEINUR 30 (A) 12/28/2019 1850   UROBILINOGEN 0.2 06/17/2010 1620   NITRITE NEGATIVE 12/28/2019 1850   LEUKOCYTESUR NEGATIVE 12/28/2019 1850   Sepsis Labs: @LABRCNTIP (procalcitonin:4,lacticidven:4) ) Recent Results (from the past 240 hour(s))  SARS CORONAVIRUS 2 (TAT 6-24 HRS) Nasopharyngeal Nasopharyngeal Swab     Status: None   Collection Time: 04/14/20  1:58 PM   Specimen: Nasopharyngeal Swab  Result Value Ref Range Status   SARS Coronavirus 2 NEGATIVE NEGATIVE Final    Comment: (NOTE) SARS-CoV-2 target nucleic acids are NOT DETECTED.  The SARS-CoV-2 RNA is generally detectable in upper and lower respiratory specimens during the acute phase of infection. Negative results do not preclude SARS-CoV-2 infection, do not rule out co-infections with other pathogens, and should not be used as the sole basis for treatment or other patient management decisions. Negative results must be combined with clinical observations, patient history, and epidemiological information. The expected result is Negative.  Fact Sheet for Patients: SugarRoll.be  Fact Sheet for Healthcare Providers: https://www.woods-mathews.com/  This test is not yet approved or cleared by the [place] FDA and  has been authorized for detection and/or diagnosis of SARS-CoV-2 by FDA under an Emergency Use Authorization (EUA). This EUA will remain  in effect (meaning this test can be used) for the duration of the COVID-19 declaration under Se ction 564(b)(1) of the Act, 21 U.S.C. section 360bbb-3(b)(1), unless the authorization is terminated or revoked sooner.  Performed at [facility], [place] [address], [place], [place] [address]   Resp Panel by RT-PCR (Flu A&B, Covid) Nasopharyngeal Swab     Status: None   Collection Time: 04/17/20  4:48 PM   Specimen: Nasopharyngeal Swab;  Nasopharyngeal(NP) swabs in vial transport medium  Result Value Ref Range Status   SARS Coronavirus 2 by RT PCR NEGATIVE NEGATIVE Final    Comment: (NOTE) SARS-CoV-2 target nucleic acids are NOT DETECTED.  The SARS-CoV-2 RNA is generally detectable in upper respiratory specimens during the acute phase of infection. The lowest concentration of SARS-CoV-2 viral copies this assay can detect is 138 copies/mL. A negative result does not preclude SARS-Cov-2 infection and should not be used as the sole basis for treatment or other patient management decisions. A negative result may occur with  improper specimen collection/handling, submission of specimen other than nasopharyngeal swab, presence of viral mutation(s)  within the areas targeted by this assay, and inadequate number of viral copies(<138 copies/mL). A negative result must be combined with clinical observations, patient history, and epidemiological information. The expected result is Negative.  Fact Sheet for Patients:  EntrepreneurPulse.com.au  Fact Sheet for Healthcare Providers:  IncredibleEmployment.be  This test is no t yet approved or cleared by the [place] FDA and  has been authorized for detection and/or diagnosis of SARS-CoV-2 by FDA under an Emergency Use Authorization (EUA). This EUA will remain  in effect (meaning this test can be used) for the duration of the COVID-19 declaration under Section 564(b)(1) of the Act, 21 U.S.C.section 360bbb-3(b)(1), unless the authorization is terminated  or revoked sooner.       Influenza A by PCR NEGATIVE NEGATIVE Final   Influenza B by PCR NEGATIVE NEGATIVE Final    Comment: (NOTE) The Xpert Xpress SARS-CoV-2/FLU/RSV plus assay is intended as an aid in the diagnosis of influenza from Nasopharyngeal swab specimens and should not be used as a sole basis for treatment. Nasal washings and aspirates are unacceptable for Xpert Xpress  SARS-CoV-2/FLU/RSV testing.  Fact Sheet for Patients: EntrepreneurPulse.com.au  Fact Sheet for Healthcare Providers: IncredibleEmployment.be  This test is not yet approved or cleared by the [place] FDA and has been authorized for detection and/or diagnosis of SARS-CoV-2 by FDA under an Emergency Use Authorization (EUA). This EUA will remain in effect (meaning this test can be used) for the duration of the COVID-19 declaration under Section 564(b)(1) of the Act, 21 U.S.C. section 360bbb-3(b)(1), unless the authorization is terminated or revoked.  Performed at [facility], [place] [address] [place], [place] [address]      Radiological Exams on Admission: DG Chest 1 View  Result Date: 04/16/2020 CLINICAL DATA:  Status post right thoracentesis EXAM: CHEST  1 VIEW COMPARISON:  04/12/2020 FINDINGS: Significant interval decrease in volume of right pleural effusion, now small and layering. No pneumothorax. No new airspace opacity. Unchanged mild cardiomegaly. IMPRESSION: Significant interval decrease in volume of right pleural effusion, now small and layering. No pneumothorax. No new airspace opacity. Electronically Signed   By: Eddie Candle M.D.   On: 04/16/2020 16:33   DG Chest 2 View  Result Date: 04/17/2020 CLINICAL DATA:  Worsening shortness of breath EXAM: CHEST - 2 VIEW COMPARISON:  01/17/2020, 04/16/2020 FINDINGS: Diffuse hazy opacity throughout the right thorax, suspect component of layering pleural effusion. Cardiomegaly with vascular congestion and probable mild pulmonary edema. Possible small left effusion. Dense airspace disease at the right base. No pneumothorax. Curvilinear wirelike opacity over the left clavicular area, possible external artifact, correlate with physical exam. IMPRESSION: Cardiomegaly with vascular congestion probable mild pulmonary edema. Hazy opacity throughout the right thorax, suspect component of layering  pleural effusion. Dense airspace disease at the right base, atelectasis versus pneumonia, worsened aeration at right base since 04/16/2020 comparison. Electronically Signed   By: Donavan Foil M.D.   On: 04/17/2020 15:18   US THORACENTESIS ASP PLEURAL SPACE W/IMG GUIDE  Result Date: 04/16/2020 INDICATION: Patient with history of hypertension, diabetes, CHF, chronic kidney disease, dyspnea, recurrent right pleural effusion; request received for diagnostic and therapeutic right thoracentesis. EXAM: ULTRASOUND GUIDED DIAGNOSTIC AND THERAPEUTIC RIGHT THORACENTESIS MEDICATIONS: 1% lidocaine to skin and subcutaneous tissue COMPLICATIONS: None immediate. PROCEDURE: An ultrasound guided thoracentesis was thoroughly discussed with the patient and questions answered. The benefits, risks, alternatives and complications were also discussed. The patient understands and wishes to proceed with the procedure. Written consent was obtained. Ultrasound was  performed to localize and mark an adequate pocket of fluid in the right chest. The area was then prepped and draped in the normal sterile fashion. 1% Lidocaine was used for local anesthesia. Under ultrasound guidance a 6 Fr Safe-T-Centesis catheter was introduced. Thoracentesis was performed. The catheter was removed and a dressing applied. FINDINGS: A total of approximately 2 liters of hazy,amber fluid was removed. Samples were sent to the laboratory as requested by the clinical team. IMPRESSION: Successful ultrasound guided diagnostic and therapeutic right thoracentesis yielding 2 liters of pleural fluid. Read by: Rowe Robert, PA-C Electronically Signed   By: Aletta Edouard M.D.   On: 04/16/2020 16:14    EKG: Independently reviewed. SR, RBBB, LAFB, LVH with repolarization abnormality.   Assessment/Plan  1. Acute hypoxic respiratory failure; acute on chronic diastolic CHF; recurrent right pleural effusion; ?CAP  - Patient with CKD IV, HFpEF, and recurrent right  pleural effusion s/p outpatient thoracentesis on 12/13 with 2 liters off, now presenting with increased cough and SOB, sat 81% on rm air while at rest in ED  - Pleural fluid from yesterday appeared to be transudative, will check serum protein and LDH  - There is question of pneumonia at right base on CXR; no fever or leukocytosis on admission but he has new productive cough  - EF was preserved on echo in June  - Nephrology was contacted by ED physician and Lasix 120 mg IV was recommended  - Continue diuresis with Lasix 120 mg IV q12h, start Rocephin and azithromycin, check/trend procalcitonin, check strep pneumo and legionella antigens, monitor weight and I/Os, consider repeating thoracentesis this admission   2. CKD IV  - BUN is 103 and SCr 4.18 on admission, up from 76 and 4.0 in September 2021  - He is hypervolemic on admission with normal potassium, normal BP, and normal bicarbontate  - Monitor renal function and electrolytes while diuresing   3. Hypertension  - BP at goal, continue Norvasc, Coreg, and hydralazine    4. Type II DM  - A1c was 6.6% in August 2021  - Currently diet-controlled at home  - Serum glucose 186 in ED  - Monitor CBGs and use low-intensity SSI if needed     DVT prophylaxis: sq heparin  Code Status: Full  Family Communication: Wife updated at bedside Disposition Plan:  Patient is from: Home  Anticipated d/c is to: TBD Anticipated d/c date is: 04/21/20 Patient currently: Pending improvement in respiratory status  Consults called: None  Admission status: Inpatient     Vianne Bulls, MD [facility]  04/17/2020, 9:49 PM

## 2020-04-17 NOTE — Telephone Encounter (Signed)
Didn't see DPR on in FYI tab so called patient. He gave consent to call back wife to discuss r. Wert's recommendations. I did share the recommendations with patient, and then patient told me to call his wife. There is a DPR from 2018 stating we can share information with his wife.   ATC wife unable to reach left message to call office back

## 2020-04-17 NOTE — ED Provider Notes (Signed)
[facility] EMERGENCY DEPARTMENT Provider Note   CSN: [id number] Arrival date & time: 04/17/20  1442     History Chief Complaint  Patient presents with  . Post-op Problem  . Shortness of Breath    Melvin Mcclure is a 69 y.o. male with PMHx CHD, Diabetes, HTN, HLD, CKD stage IV who presents to the ED today with complaint of worsening shortness of breath s/p thoracentesis to right side yesterday.  Reports that for the past 1.5 weeks patient has been having worsening shortness of breath.  He was admitted in September and required thoracentesis at that point was discharged home with oxygen as needed.  Wife states that he has not required oxygen and she felt 1.5 weeks ago where she placed him on 2 to 4 L as needed.  She reports that his shortness of breath seem to be getting worse and so she followed up with her nephrologist who recommended giving patient metolazone 5 mg last Saturday on 12/04.  Saw nephrology last week on the eighth and was given an additional dose.  During that visit he was noted to have diminished breath sounds at the bases of the right lung and was to pulmonology for further evaluation.  At pulmonologist patient had an x-ray done which did show pleural effusion on the right side requiring thoracentesis. He had a thoracentesis yesterday with 2L pulled out however began having worsening symptoms at home. Wife reports that pt began coughing last night which is new causing her concern. She called pulmonology today and was advised to come to the ED for further evaluation. Pt has had a total of 4 doses of 5 mg Metalozone in the past 1.5 weeks without improvement as well as his regular 40 mg Torsemide BID. Wife denies fevers or chills.   The history is provided by the patient, the spouse and medical records.       Past Medical History:  Diagnosis Date  . Anemia in chronic kidney disease (CKD)   . CHF (congestive heart failure) ([place]) 05/28/2018   dx 05/28/18  .  Diabetes mellitus without complication ([place])   . Hyperlipidemia   . Hypertension   . Iron deficiency anemia   . Perforating neurotrophic ulcer of foot [facility])     Patient Active Problem List   Diagnosis Date Noted  . Recurrent right pleural effusion 01/17/2020  . HTN (hypertension) 01/17/2020  . Acute exacerbation of CHF (congestive heart failure) ([place]) 12/23/2019  . Hypocalcemia 12/23/2019  . Anemia of chronic disease 12/23/2019  . Vertigo 12/23/2019  . Acute respiratory failure with hypoxia ([place]) 12/23/2019  . CAP (community acquired pneumonia) 10/28/2019  . Hypertensive cardiomyopathy, with heart failure ([place]) 06/15/2018  . Acute diastolic CHF (congestive heart failure) ([place]) 05/28/2018  . DM2 (diabetes mellitus, type 2) ([place]) 05/28/2018  . CKD stage 4 due to type 2 diabetes mellitus ([place]) 05/28/2018  . CHF (congestive heart failure) ([place]) 05/28/2018    Past Surgical History:  Procedure Laterality Date  . ACHILLES TENDON REPAIR    . CYST EXCISION    . IR THORACENTESIS ASP PLEURAL SPACE W/IMG GUIDE  12/26/2019  . IR THORACENTESIS ASP PLEURAL SPACE W/IMG GUIDE  01/18/2020       Family History  Problem Relation Age of Onset  . Diabetes Mellitus II Mother   . Hypertension Father     Social History   Tobacco Use  . Smoking status: Former Research scientist (life sciences)  . Smokeless tobacco: Never Used  . Tobacco comment: smoked for only  1.5 years in the 70's  Vaping Use  . Vaping Use: Never used  Substance Use Topics  . Alcohol use: No    Alcohol/week: 0.0 standard drinks  . Drug use: No    Home Medications Prior to Admission medications   Medication Sig Start Date End Date Taking? Authorizing Provider  amLODipine (NORVASC) 10 MG tablet Take 10 mg by mouth daily.    [provider]  aspirin 81 MG tablet Take 81 mg by mouth daily.    [provider]  calcitRIOL (ROCALTROL) 0.25 MCG capsule Take 0.25 mcg by mouth daily. Pt takes 1 tablet daily 10/18/19   [provider]  calcium carbonate (TUMS - DOSED IN MG ELEMENTAL CALCIUM) 500 MG chewable tablet Chew 2 tablets by mouth daily.     [provider]  carvedilol (COREG) 12.5 MG tablet Take 1 tablet (12.5 mg total) by mouth 2 (two) times daily with a meal. 02/16/20   Crenshaw, Denice Bors, MD  diclofenac Sodium (VOLTAREN) 1 % GEL Apply 2 g topically 4 (four) times daily. Patient taking differently: Apply 2 g topically 4 (four) times daily as needed (pain). 12/31/19   Geradine Girt, DO  ferrous sulfate 325 (65 FE) MG tablet Take 325 mg by mouth daily with breakfast.    [provider]  gabapentin (NEURONTIN) 300 MG capsule Take 1 capsule (300 mg total) by mouth 2 (two) times daily. 12/31/19   Geradine Girt, DO  hydrALAZINE (APRESOLINE) 50 MG tablet Take 1 tablet (50 mg total) by mouth every 8 (eight) hours. Patient taking differently: Take 50 mg by mouth 3 (three) times daily. Pt takes 1 tablet daily 06/04/18   Dessa Phi, DO  isosorbide mononitrate (IMDUR) 60 MG 24 hr tablet Take 1 tablet (60 mg total) by mouth every evening. 02/16/20   Lelon Perla, MD  meclizine (ANTIVERT) 25 MG tablet Take 1 tablet (25 mg total) by mouth 3 (three) times daily. 12/31/19   Geradine Girt, DO  metolazone (ZAROXOLYN) 5 MG tablet Take 1 tablet (5 mg total) by mouth as needed (Twice weekly as needed for shortness of breath, swelling, weight gain). 01/23/20   [place] Eric J, DO  senna-docusate (SENOKOT-S) 8.6-50 MG tablet Take 1 tablet by mouth 2 (two) times daily as needed for mild constipation.     [provider]  torsemide (DEMADEX) 20 MG tablet Take 2 tablets (40 mg total) by mouth 2 (two) times daily. 01/23/20 04/22/20  [place] Eric J, DO    Allergies    Patient has no known allergies.  Review of Systems   Review of Systems  Constitutional: Negative for chills and fever.  Respiratory: Positive for cough, shortness of breath and wheezing.   All other systems reviewed and are  negative.   Physical Exam Updated Vital Signs BP (!) 148/78   Pulse 69   Temp 98.2 F (36.8 C)   Resp (!) 22   SpO2 97%   Physical Exam Vitals and nursing note reviewed.  Constitutional:      Appearance: He is not ill-appearing or diaphoretic.  HENT:     Head: Normocephalic and atraumatic.  Eyes:     Conjunctiva/sclera: Conjunctivae normal.  Cardiovascular:     Rate and Rhythm: Normal rate and regular rhythm.  Pulmonary:     Effort: Pulmonary effort is normal.     Breath sounds: Decreased breath sounds present.  Chest:     Chest wall: No tenderness.  Abdominal:  Palpations: Abdomen is soft.     Tenderness: There is no abdominal tenderness. There is no guarding or rebound.  Musculoskeletal:     Cervical back: Neck supple.     Right lower leg: No edema.     Left lower leg: No edema.  Skin:    General: Skin is warm and dry.  Neurological:     Mental Status: He is alert.     ED Results / Procedures / Treatments   Labs (all labs ordered are listed, but only abnormal results are displayed) Labs Reviewed  BASIC METABOLIC PANEL - Abnormal; Notable for the following components:      Result Value   Chloride 95 (*)    Glucose, Bld 186 (*)    BUN 103 (*)    Creatinine, Ser 4.18 (*)    Calcium 7.8 (*)    GFR, Estimated 15 (*)    Anion gap 17 (*)    All other components within normal limits  CBC - Abnormal; Notable for the following components:   Hemoglobin 11.1 (*)    HCT 36.4 (*)    All other components within normal limits  BRAIN NATRIURETIC PEPTIDE - Abnormal; Notable for the following components:   B Natriuretic Peptide 480.6 (*)    All other components within normal limits  RESP PANEL BY RT-PCR (FLU A&B, COVID) ARPGX2  TROPONIN I (HIGH SENSITIVITY)  TROPONIN I (HIGH SENSITIVITY)    EKG None  Radiology DG Chest 1 View  Result Date: 04/16/2020 CLINICAL DATA:  Status post right thoracentesis EXAM: CHEST  1 VIEW COMPARISON:  04/12/2020 FINDINGS:  Significant interval decrease in volume of right pleural effusion, now small and layering. No pneumothorax. No new airspace opacity. Unchanged mild cardiomegaly. IMPRESSION: Significant interval decrease in volume of right pleural effusion, now small and layering. No pneumothorax. No new airspace opacity. Electronically Signed   By: Eddie Candle M.D.   On: 04/16/2020 16:33   DG Chest 2 View  Result Date: 04/17/2020 CLINICAL DATA:  Worsening shortness of breath EXAM: CHEST - 2 VIEW COMPARISON:  01/17/2020, 04/16/2020 FINDINGS: Diffuse hazy opacity throughout the right thorax, suspect component of layering pleural effusion. Cardiomegaly with vascular congestion and probable mild pulmonary edema. Possible small left effusion. Dense airspace disease at the right base. No pneumothorax. Curvilinear wirelike opacity over the left clavicular area, possible external artifact, correlate with physical exam. IMPRESSION: Cardiomegaly with vascular congestion probable mild pulmonary edema. Hazy opacity throughout the right thorax, suspect component of layering pleural effusion. Dense airspace disease at the right base, atelectasis versus pneumonia, worsened aeration at right base since 04/16/2020 comparison. Electronically Signed   By: Donavan Foil M.D.   On: 04/17/2020 15:18   US THORACENTESIS ASP PLEURAL SPACE W/IMG GUIDE  Result Date: 04/16/2020 INDICATION: Patient with history of hypertension, diabetes, CHF, chronic kidney disease, dyspnea, recurrent right pleural effusion; request received for diagnostic and therapeutic right thoracentesis. EXAM: ULTRASOUND GUIDED DIAGNOSTIC AND THERAPEUTIC RIGHT THORACENTESIS MEDICATIONS: 1% lidocaine to skin and subcutaneous tissue COMPLICATIONS: None immediate. PROCEDURE: An ultrasound guided thoracentesis was thoroughly discussed with the patient and questions answered. The benefits, risks, alternatives and complications were also discussed. The patient understands and wishes  to proceed with the procedure. Written consent was obtained. Ultrasound was performed to localize and mark an adequate pocket of fluid in the right chest. The area was then prepped and draped in the normal sterile fashion. 1% Lidocaine was used for local anesthesia. Under ultrasound guidance a 6 Fr Safe-T-Centesis catheter was introduced.  Thoracentesis was performed. The catheter was removed and a dressing applied. FINDINGS: A total of approximately 2 liters of hazy,amber fluid was removed. Samples were sent to the laboratory as requested by the clinical team. IMPRESSION: Successful ultrasound guided diagnostic and therapeutic right thoracentesis yielding 2 liters of pleural fluid. Read by: Rowe Robert, PA-C Electronically Signed   By: Aletta Edouard M.D.   On: 04/16/2020 16:14    Procedures Procedures (including critical care time)  Medications Ordered in ED Medications  furosemide (LASIX) 120 mg in dextrose 5 % 50 mL IVPB (120 mg Intravenous New Bag/Given 04/17/20 1807)    ED Course  I have reviewed the triage vital signs and the nursing notes.  Pertinent labs & imaging results that were available during my care of the patient were reviewed by me and considered in my medical decision making (see chart for details).    MDM Rules/Calculators/A&P                          69 year old male presents to the ED today with worsening shortness of breath secondary to thoracentesis on the right side that he had performed yesterday.  For the past 1.5 weeks patient has been on 2 to 4 L as needed per wife.  He presents to the ED today without oxygen on and 81%.  Placed on 4 L and satting 100%.  On exam patient has diminished breath sounds bilaterally.  An x-ray was obtained while patient was in the waiting which does show worsening pleural effusion secondary to the post thoracentesis x-ray done yesterday.  There is question for underlying pneumonia as well.  The patient denies fevers or chills however did  start having a cough yesterday after the procedure.  We will plan to add on labs prior to starting antibiotics.  Will wait on BMP prior to providing IV diuresis.  Patient will need to be admitted due to worsening symptoms secondary to thoracentesis done yesterday.   CBC without leukocytosis.  Hemoglobin stable at 11.1. BMP with a creatinine 4.18 which is around patient's baseline.  BUN is elevated at 103.  Will consult nephrology to discuss IV diuresis management.  Patient is not a dialysis patient at this time and do not want to worsen his kidneys anymore. Troponin 11  Discussed case with nephrologist Dr. Joelyn Oms who recommends 120 mg IV Lasix and admission to hospitalist. Will be happy to consult as needed if he does not respond to lasix or his kidney function worsens.   Discussed case with [facility] Dr. Myna Hidalgo who will evaluate patient for admission.   This note was prepared using Dragon voice recognition software and may include unintentional dictation errors due to the inherent limitations of voice recognition software.  Final Clinical Impression(s) / ED Diagnoses Final diagnoses:  Shortness of breath  Pleural effusion  Hypoxia    Rx / DC Orders ED Discharge Orders    None       Eustaquio Maize, PA-C 04/17/20 1840    Lucrezia Starch, MD 04/17/20 2355

## 2020-04-17 NOTE — ED Notes (Signed)
Pt placed on 4L O2, sats improved to 92%.

## 2020-04-17 NOTE — Telephone Encounter (Signed)
Called and spoke to [place]. Informed her of the recs per MW. She verbalized understanding. Nothing further needed at this time. Will close encounter.

## 2020-04-17 NOTE — Telephone Encounter (Signed)
Lmtcb for pt's wife, [name].

## 2020-04-18 ENCOUNTER — Inpatient Hospital Stay (HOSPITAL_COMMUNITY): Payer: Medicare Other

## 2020-04-18 ENCOUNTER — Encounter (HOSPITAL_COMMUNITY): Payer: Self-pay | Admitting: Family Medicine

## 2020-04-18 DIAGNOSIS — I110 Hypertensive heart disease with heart failure: Secondary | ICD-10-CM

## 2020-04-18 DIAGNOSIS — E1100 Type 2 diabetes mellitus with hyperosmolarity without nonketotic hyperglycemic-hyperosmolar coma (NKHHC): Secondary | ICD-10-CM

## 2020-04-18 DIAGNOSIS — I43 Cardiomyopathy in diseases classified elsewhere: Secondary | ICD-10-CM

## 2020-04-18 DIAGNOSIS — R0902 Hypoxemia: Secondary | ICD-10-CM

## 2020-04-18 DIAGNOSIS — J189 Pneumonia, unspecified organism: Principal | ICD-10-CM

## 2020-04-18 DIAGNOSIS — J9621 Acute and chronic respiratory failure with hypoxia: Secondary | ICD-10-CM

## 2020-04-18 LAB — CBC
HCT: 35 % — ABNORMAL LOW (ref 39.0–52.0)
Hemoglobin: 10.8 g/dL — ABNORMAL LOW (ref 13.0–17.0)
MCH: 26.7 pg (ref 26.0–34.0)
MCHC: 30.9 g/dL (ref 30.0–36.0)
MCV: 86.4 fL (ref 80.0–100.0)
Platelets: 239 10*3/uL (ref 150–400)
RBC: 4.05 MIL/uL — ABNORMAL LOW (ref 4.22–5.81)
RDW: 15.4 % (ref 11.5–15.5)
WBC: 7.5 10*3/uL (ref 4.0–10.5)
nRBC: 0 % (ref 0.0–0.2)

## 2020-04-18 LAB — BASIC METABOLIC PANEL
Anion gap: 12 (ref 5–15)
BUN: 106 mg/dL — ABNORMAL HIGH (ref 8–23)
CO2: 32 mmol/L (ref 22–32)
Calcium: 7.6 mg/dL — ABNORMAL LOW (ref 8.9–10.3)
Chloride: 95 mmol/L — ABNORMAL LOW (ref 98–111)
Creatinine, Ser: 4.14 mg/dL — ABNORMAL HIGH (ref 0.61–1.24)
GFR, Estimated: 15 mL/min — ABNORMAL LOW (ref >60)
Glucose, Bld: 162 mg/dL — ABNORMAL HIGH (ref 70–99)
Potassium: 3.7 mmol/L (ref 3.5–5.1)
Sodium: 139 mmol/L (ref 135–145)

## 2020-04-18 LAB — HEMOGLOBIN A1C
Hgb A1c MFr Bld: 7.3 % — ABNORMAL HIGH (ref 4.8–5.6)
Mean Plasma Glucose: 162.81 mg/dL

## 2020-04-18 LAB — GLUCOSE, CAPILLARY
Glucose-Capillary: 116 mg/dL — ABNORMAL HIGH (ref 70–99)
Glucose-Capillary: 205 mg/dL — ABNORMAL HIGH (ref 70–99)
Glucose-Capillary: 99 mg/dL (ref 70–99)
Glucose-Capillary: 209 mg/dL — ABNORMAL HIGH (ref 70–99)

## 2020-04-18 LAB — HEPATIC FUNCTION PANEL
ALT: 12 U/L (ref 0–44)
AST: 11 U/L — ABNORMAL LOW (ref 15–41)
Albumin: 3 g/dL — ABNORMAL LOW (ref 3.5–5.0)
Alkaline Phosphatase: 84 U/L (ref 38–126)
Bilirubin, Direct: <0.1 mg/dL (ref 0.0–0.2)
Total Bilirubin: 0.8 mg/dL (ref 0.3–1.2)
Total Protein: 7.3 g/dL (ref 6.5–8.1)

## 2020-04-18 LAB — PROCALCITONIN
Procalcitonin: <0.1 ng/mL
Procalcitonin: <0.1 ng/mL

## 2020-04-18 LAB — CYTOLOGY - NON PAP

## 2020-04-18 LAB — LACTATE DEHYDROGENASE: LDH: 213 U/L — ABNORMAL HIGH (ref 98–192)

## 2020-04-18 LAB — STREP PNEUMONIAE URINARY ANTIGEN: Strep Pneumo Urinary Antigen: NEGATIVE

## 2020-04-18 MED ORDER — ALBUTEROL SULFATE (2.5 MG/3ML) 0.083% IN NEBU
2.5000 mg | INHALATION_SOLUTION | Freq: Four times a day (QID) | RESPIRATORY_TRACT | Status: DC | PRN
  Administered 2020-04-18: 06:00:00 2.5 mg via RESPIRATORY_TRACT
  Filled 2020-04-18: qty 3

## 2020-04-18 MED ORDER — SODIUM CHLORIDE 0.9 % IV SOLN
INTRAVENOUS | Status: DC | PRN
  Administered 2020-04-18: 21:00:00 250 mL via INTRAVENOUS
  Administered 2020-04-19: 22:00:00 200 mL via INTRAVENOUS
  Administered 2020-04-20: 21:00:00 250 mL via INTRAVENOUS

## 2020-04-18 NOTE — Consult Note (Signed)
Reason for Consult: Acute kidney injury on chronic kidney disease stage IV Referring Physician: Hosie Poisson MD [facility])  HPI:  69 year old African-American man with past medical history significant for hypertension, congestive heart failure (diastolic dysfunction with EF 55 to 60%), dyslipidemia and chronic kidney disease stage IV (baseline creatinine appears to be 3.5-4.0).  He underwent right-sided thoracentesis 2 days ago with 2 L fluid drainage and thereafter developed acute onset of shortness of breath with cough and wheezing.  He denies any fever, chills or chest pain and reports thick yellow sputum.  He denies any pedal edema or increasing abdominal girth.  He has been adherent with his torsemide 40 mg twice a day and his wife helps direct his as needed use of metolazone based on his weight/oxygen requirements.  Chest x-ray on admission showed cardiomegaly, vascular congestion with mild pulmonary edema and a hazy opacity throughout the right thorax suspicious for pleural effusion.  There was a focal area of increased density in the right base suggestive of pneumonia.  After getting a 120 mg Lasix bolus in the ER, he was started on furosemide 120 mg IV twice daily.  He reports mild improvement of his shortness of breath.  He denies any preceding nausea, vomiting or diarrhea.  He denies any unusual dietary indiscretion or NSAID use.  Past Medical History:  Diagnosis Date  . Anemia in chronic kidney disease (CKD)   . CHF (congestive heart failure) ([place]) 05/28/2018   dx 05/28/18  . Diabetes mellitus without complication ([place])   . Hyperlipidemia   . Hypertension   . Iron deficiency anemia   . Perforating neurotrophic ulcer of foot ([place])     Past Surgical History:  Procedure Laterality Date  . ACHILLES TENDON REPAIR    . CYST EXCISION    . IR THORACENTESIS ASP PLEURAL SPACE W/IMG GUIDE  12/26/2019  . IR THORACENTESIS ASP PLEURAL SPACE W/IMG GUIDE  01/18/2020  . THORACENTESIS  04/16/2020     Family History  Problem Relation Age of Onset  . Diabetes Mellitus II Mother   . Hypertension Father     Social History:  reports that he has quit smoking. He has never used smokeless tobacco. He reports that he does not drink alcohol and does not use drugs.  Allergies: No Known Allergies  Medications:  Scheduled: . amLODipine  10 mg Oral Daily  . aspirin EC  81 mg Oral Daily  . calcitRIOL  0.25 mcg Oral Daily  . calcium carbonate  2 tablet Oral Daily  . carvedilol  12.5 mg Oral BID WC  . gabapentin  300 mg Oral BID  . heparin  5,000 Units Subcutaneous Q8H  . hydrALAZINE  50 mg Oral TID  . insulin aspart  0-6 Units Subcutaneous TID WC  . isosorbide mononitrate  60 mg Oral QPM    BMP Latest Ref Rng & Units 04/18/2020 04/17/2020 01/23/2020  Glucose 70 - 99 mg/dL 162(H) 186(H) 218(H)  BUN 8 - 23 mg/dL 106(H) 103(H) 76(H)  Creatinine 0.61 - 1.24 mg/dL 4.14(H) 4.18(H) 4.01(H)  BUN/Creat Ratio 10 - 24 - - -  Sodium 135 - 145 mmol/L 139 140 137  Potassium 3.5 - 5.1 mmol/L 3.7 3.7 4.3  Chloride 98 - 111 mmol/L 95(L) 95(L) 96(L)  CO2 22 - 32 mmol/L 32 28 30  Calcium 8.9 - 10.3 mg/dL 7.6(L) 7.8(L) 7.4(L)   CBC Latest Ref Rng & Units 04/18/2020 04/17/2020 01/17/2020  WBC 4.0 - 10.5 K/uL 7.5 8.2 7.0  Hemoglobin 13.0 - 17.0  g/dL 10.8(L) 11.1(L) 11.1(L)  Hematocrit 39.0 - 52.0 % 35.0(L) 36.4(L) 37.1(L)  Platelets 150 - 400 K/uL 239 255 271     DG Chest 1 View  Result Date: 04/16/2020 CLINICAL DATA:  Status post right thoracentesis EXAM: CHEST  1 VIEW COMPARISON:  04/12/2020 FINDINGS: Significant interval decrease in volume of right pleural effusion, now small and layering. No pneumothorax. No new airspace opacity. Unchanged mild cardiomegaly. IMPRESSION: Significant interval decrease in volume of right pleural effusion, now small and layering. No pneumothorax. No new airspace opacity. Electronically Signed   By: Eddie Candle M.D.   On: 04/16/2020 16:33   DG Chest 2  View  Result Date: 04/17/2020 CLINICAL DATA:  Worsening shortness of breath EXAM: CHEST - 2 VIEW COMPARISON:  01/17/2020, 04/16/2020 FINDINGS: Diffuse hazy opacity throughout the right thorax, suspect component of layering pleural effusion. Cardiomegaly with vascular congestion and probable mild pulmonary edema. Possible small left effusion. Dense airspace disease at the right base. No pneumothorax. Curvilinear wirelike opacity over the left clavicular area, possible external artifact, correlate with physical exam. IMPRESSION: Cardiomegaly with vascular congestion probable mild pulmonary edema. Hazy opacity throughout the right thorax, suspect component of layering pleural effusion. Dense airspace disease at the right base, atelectasis versus pneumonia, worsened aeration at right base since 04/16/2020 comparison. Electronically Signed   By: Donavan Foil M.D.   On: 04/17/2020 15:18   CT CHEST WO CONTRAST  Result Date: 04/18/2020 CLINICAL DATA:  Pneumonia.  Question abscess or empyema. EXAM: CT CHEST WITHOUT CONTRAST TECHNIQUE: Multidetector CT imaging of the chest was performed following the standard protocol without IV contrast. COMPARISON:  Chest radiography yesterday.  Chest CT 11/01/2019 FINDINGS: Cardiovascular: Mild cardiomegaly. No pericardial effusion. Coronary artery calcification is present. Aortic atherosclerotic calcification is present. Mediastinum/Nodes: No mediastinal mass. No paratracheal lymphadenopathy. Adenopathy is likely present in the right hilar region, presumed reactive to the right lower lobe inflammatory disease. Mass lesion not excluded based on this exam. Lungs/Pleura: Left lung shows dependent atelectasis or infiltrate in the left lower lobe. Left upper lobe is clear. Right chest shows a moderate size effusion layering dependently. No evidence of loculation. There is infiltrate/consolidation/collapse affecting the right lower lobe. Some dependent infiltrate/volume loss present in  the right upper lobe. Moderate hazy pneumonia in the right middle lobe. Upper Abdomen: Negative Musculoskeletal: Ordinary thoracic degenerative changes. IMPRESSION: 1. Right lower lobe infiltrate/consolidation/collapse. Some dependent infiltrate/volume loss in the right upper lobe. Moderate hazy pneumonia in the right middle lobe. Moderate size right effusion layering dependently without evidence of loculation. Findings may be due 2 pneumonia, but the possibility of tumor in the right inferior hilar region is not excluded by this exam. 2. Dependent atelectasis or infiltrate in the left lower lobe. 3. Aortic atherosclerosis. Coronary artery calcification. Aortic Atherosclerosis (ICD10-I70.0). Electronically Signed   By: Nelson Chimes M.D.   On: 04/18/2020 12:51   US THORACENTESIS ASP PLEURAL SPACE W/IMG GUIDE  Result Date: 04/16/2020 INDICATION: Patient with history of hypertension, diabetes, CHF, chronic kidney disease, dyspnea, recurrent right pleural effusion; request received for diagnostic and therapeutic right thoracentesis. EXAM: ULTRASOUND GUIDED DIAGNOSTIC AND THERAPEUTIC RIGHT THORACENTESIS MEDICATIONS: 1% lidocaine to skin and subcutaneous tissue COMPLICATIONS: None immediate. PROCEDURE: An ultrasound guided thoracentesis was thoroughly discussed with the patient and questions answered. The benefits, risks, alternatives and complications were also discussed. The patient understands and wishes to proceed with the procedure. Written consent was obtained. Ultrasound was performed to localize and mark an adequate pocket of fluid in the right chest.  The area was then prepped and draped in the normal sterile fashion. 1% Lidocaine was used for local anesthesia. Under ultrasound guidance a 6 Fr Safe-T-Centesis catheter was introduced. Thoracentesis was performed. The catheter was removed and a dressing applied. FINDINGS: A total of approximately 2 liters of hazy,amber fluid was removed. Samples were sent to  the laboratory as requested by the clinical team. IMPRESSION: Successful ultrasound guided diagnostic and therapeutic right thoracentesis yielding 2 liters of pleural fluid. Read by: Rowe Robert, PA-C Electronically Signed   By: Aletta Edouard M.D.   On: 04/16/2020 16:14    Review of Systems  Constitutional: Positive for fatigue. Negative for appetite change, chills and fever.  HENT: Negative for nosebleeds, sinus pressure, sinus pain, sore throat and trouble swallowing.   Eyes: Negative for pain, redness and itching.  Respiratory: Positive for cough, shortness of breath and wheezing. Negative for apnea and chest tightness.   Cardiovascular: Negative for chest pain and leg swelling.  Gastrointestinal: Negative for abdominal pain, constipation, diarrhea, nausea and vomiting.  Genitourinary: Negative for difficulty urinating, flank pain, frequency, hematuria and urgency.  Musculoskeletal: Negative for back pain, joint swelling and neck pain.  Skin: Negative for pallor and rash.  Neurological: Negative for dizziness, speech difficulty, weakness, numbness and headaches.  Psychiatric/Behavioral: Negative for confusion.   Blood pressure (!) 144/72, pulse 67, temperature 97.9 F (36.6 C), temperature source Oral, resp. rate 15, height 5\' 10"  (1.778 m), weight 87.3 kg, SpO2 96 %. Physical Exam Vitals and nursing note reviewed.  Constitutional:      General: He is not in acute distress.    Appearance: He is well-developed and normal weight. He is ill-appearing.  HENT:     Head: Normocephalic and atraumatic.     Mouth/Throat:     Mouth: Mucous membranes are moist.  Eyes:     Extraocular Movements: Extraocular movements intact.     Pupils: Pupils are equal, round, and reactive to light.  Neck:     Vascular: JVD present.  Cardiovascular:     Rate and Rhythm: Normal rate and regular rhythm.     Heart sounds: No murmur heard.   Pulmonary:     Effort: Pulmonary effort is normal.     Breath  sounds: Examination of the right-middle field reveals rhonchi and rales. Examination of the right-lower field reveals rhonchi and rales. Examination of the left-lower field reveals rales. Rhonchi and rales present.  Abdominal:     General: Bowel sounds are normal.     Palpations: Abdomen is soft. There is no mass.     Tenderness: There is no abdominal tenderness.  Musculoskeletal:     Cervical back: Normal range of motion.     Right lower leg: No edema.     Left lower leg: No edema.  Skin:    General: Skin is warm and dry.  Neurological:     General: No focal deficit present.     Mental Status: He is alert and oriented to person, place, and time.     Assessment/Plan: 1.  Acute kidney injury on chronic kidney disease stage IV: This appears to be most likely hemodynamically mediated acute kidney injury in the setting of what plausibly may have been reexpansion pulmonary edema following thoracentesis.  Additional perturbation of RAS axis likely from diuretic use adding to her creatinine.  Based on his current exam, he does have bilateral rales and I will continue him on furosemide 120 mg IV twice daily for the next 24 hours and  reassess clinical status/labs to decide on additional adjustment of diuretic therapy.  He has worsening azotemia but without any uremic symptoms at this time. Avoid nephrotoxic medications including NSAIDs and iodinated intravenous contrast exposure unless the latter is absolutely indicated.  Preferred narcotic agents for pain control are hydromorphone, fentanyl, and methadone. Morphine should not be used. Avoid Baclofen and avoid oral sodium phosphate and magnesium citrate based laxatives / bowel preps. Continue strict Input and Output monitoring. Will monitor the patient closely with you and intervene or adjust therapy as indicated by changes in clinical status/labs. 2.  Acute hypoxic respiratory failure: This appears to be consistent with reexpansion pulmonary edema/acute  exacerbation of diastolic heart failure and recurrent pleural right pleural effusion.  Attempts at diuresis at this time and CT scan of the chest from today shows moderate hazy pneumonia in the right middle lobe with a right effusion layering without loculation.  Will reevaluate with ongoing antibiotics for CAP coverage along with diuresis. 3.  Hypertension: Blood pressures currently within acceptable range, will continue him on his outpatient regimen including amlodipine, carvedilol and hydralazine which we undertake diuresis. 4.  Anemia of chronic kidney disease: Without overt loss, will continue to monitor for indications for ESA. 5.  Secondary hyperparathyroidism: I will continue to monitor his calcium/phosphorus trends on labs and resume calcitriol for PTH control.   Earland Reish K. 04/18/2020, 3:11 PM

## 2020-04-18 NOTE — ED Notes (Signed)
Paged hospitalist for RN, Fredrich Birks

## 2020-04-18 NOTE — ED Notes (Signed)
Tele  Breakfast Ordered 

## 2020-04-18 NOTE — ED Notes (Signed)
Pt reporting increased wheezing md notified.

## 2020-04-18 NOTE — Progress Notes (Signed)
PROGRESS NOTE    Melvin Mcclure  KWI:[id number] DOB: June 15, 1950 DOA: 04/17/2020 PCP: Nolene Ebbs, MD    Chief Complaint  Patient presents with  . Post-op Problem  . Shortness of Breath    Brief Narrative:  69 year old gentleman prior history of stage IV CKD, heart failure with preserved EF, type 2 diabetes mellitus and hypertension presents with worsening shortness of breath and productive cough.  Patient underwent right thoracentesis on 04/17/2019 2 L of fluid was removed despite that patient continues to have worsening shortness of breath and presented to the ED.  On arrival to ED he was hypoxic and tachypneic.  Chest x-ray showed notable for vascular congestion and mild pulmonary edema, hazy opacity throughout the right thorax suspicious for layering pleural effusion, increased density at the right base, suspicious for pneumonia versus atelectasis.  Patient was admitted to [facility] for acute respiratory failure with hypoxia requiring up to 2 L of nasal cannula oxygen.  Nephrology consulted, and he was started on high dose diuretics.  Assessment & Plan:   Principal Problem:   Acute on chronic respiratory failure with hypoxia (HCC) Active Problems:   Acute on chronic diastolic CHF (congestive heart failure) (HCC)   DM2 (diabetes mellitus, type 2) (HCC)   CKD stage 4 due to type 2 diabetes mellitus (HCC)   Hypertensive cardiomyopathy, with heart failure (HCC)   CAP (community acquired pneumonia)   Pleural effusion   HTN (hypertension)  Acute hypoxic respiratory failure requiring 2 lit/mi nasal cannula oxygen to keep sats greater than 90%. Probably secondary to a combination of pulmonary edema, from acute on chronic diastolic heart failure, worsening stage IV CKD , right pleural effusion, possible community-acquired pneumonia,  Patient underwent thoracentesis as outpatient on 04/16/2020. Transudate .  He was started on IV Rocephin and Zithromax for possible community-acquired  pneumonia. He was also started on IV lasix 120 mg BID for pulm edema. CT chest ordered for further evaluation.  Nephrology consulted .  Urine fore strep pneumo antigen is negative. Pro calcitonin is less than 0.10    Stage 4 CKD:  Baseline creatinine is between 3.5 and 4.    Hypertension:  Optimal BP parameters.   Anemia of chronic disease probably from CRF.  Hemoglobin of 10.8 this am.    Type 2 DM;  CBG (last 3)  Recent Labs    04/17/20 2339 04/18/20 0734  GLUCAP 212* 116*   Resume SSI. Hemoglobin A1c is 7.3.    DVT prophylaxis: Heparin Code Status:  full code  family Communication: None at bedside  disposition:   Status is: Inpatient  Remains inpatient appropriate because:Ongoing diagnostic testing needed not appropriate for outpatient work up, IV treatments appropriate due to intensity of illness or inability to take PO and Inpatient level of care appropriate due to severity of illness   Dispo: The patient is from: Home              Anticipated d/c is to: Pending              Anticipated d/c date is: 3 days              Patient currently is not medically stable to d/c.       Consultants:   Nephrology   Procedures: Underwent ultrasound thoracentesis on 04/16/20   Antimicrobials: IV Rocephin and Zithromax for pneumonia   Subjective: Continues to remain short of breath  Objective: Vitals:   04/18/20 0545 04/18/20 0600 04/18/20 0617 04/18/20 0736  BP: (!) 147/74 Marland Kitchen)  147/74  (!) 147/71  Pulse: 72 67  70  Resp: 17   20  Temp:   98.5 F (36.9 C) 98.1 F (36.7 C)  TempSrc:   Oral Oral  SpO2: 99% 98%  98%  Weight:      Height:        Intake/Output Summary (Last 24 hours) at 04/18/2020 0917 Last data filed at 04/18/2020 0537 Gross per 24 hour  Intake --  Output 1100 ml  Net -1100 ml   Filed Weights   04/17/20 1617  Weight: 87.3 kg    Examination:  General exam: Appears calm and comfortable  Respiratory system:  Tachypnea,  diminished air entry at bases more on the right, on 2 lit of [place] oxygen.  Cardiovascular system: S1 & S2 heard, RRR. No JVD, murmurs,  Gastrointestinal system: Abdomen is nondistended, soft and nontender.  Normal bowel sounds heard. Central nervous system: Alert and oriented. No focal neurological deficits. Extremities: Symmetric 5 x 5 power. Skin: No rashes, lesions or ulcers Psychiatry:  Mood & affect appropriate.     Data Reviewed: I have personally reviewed following labs and imaging studies  CBC: Recent Labs  Lab 04/17/20 1453 04/18/20 0225  WBC 8.2 7.5  HGB 11.1* 10.8*  HCT 36.4* 35.0*  MCV 86.3 86.4  PLT 255 163    Basic Metabolic Panel: Recent Labs  Lab 04/17/20 1453 04/18/20 0225  NA 140 139  K 3.7 3.7  CL 95* 95*  CO2 28 32  GLUCOSE 186* 162*  BUN 103* 106*  CREATININE 4.18* 4.14*  CALCIUM 7.8* 7.6*    GFR: Estimated Creatinine Clearance: 17.1 mL/min (A) (by C-G formula based on SCr of 4.14 mg/dL (H)).  Liver Function Tests: Recent Labs  Lab 04/17/20 2245  AST 11*  ALT 12  ALKPHOS 84  BILITOT 0.8  PROT 7.3  ALBUMIN 3.0*    CBG: Recent Labs  Lab 04/17/20 2339 04/18/20 0734  GLUCAP 212* 116*     Recent Results (from the past 240 hour(s))  SARS CORONAVIRUS 2 (TAT 6-24 HRS) Nasopharyngeal Nasopharyngeal Swab     Status: None   Collection Time: 04/14/20  1:58 PM   Specimen: Nasopharyngeal Swab  Result Value Ref Range Status   SARS Coronavirus 2 NEGATIVE NEGATIVE Final    Comment: (NOTE) SARS-CoV-2 target nucleic acids are NOT DETECTED.  The SARS-CoV-2 RNA is generally detectable in upper and lower respiratory specimens during the acute phase of infection. Negative results do not preclude SARS-CoV-2 infection, do not rule out co-infections with other pathogens, and should not be used as the sole basis for treatment or other patient management decisions. Negative results must be combined with clinical observations, patient history, and  epidemiological information. The expected result is Negative.  Fact Sheet for Patients: SugarRoll.be  Fact Sheet for Healthcare Providers: https://www.woods-mathews.com/  This test is not yet approved or cleared by the [place] FDA and  has been authorized for detection and/or diagnosis of SARS-CoV-2 by FDA under an Emergency Use Authorization (EUA). This EUA will remain  in effect (meaning this test can be used) for the duration of the COVID-19 declaration under Se ction 564(b)(1) of the Act, 21 U.S.C. section 360bbb-3(b)(1), unless the authorization is terminated or revoked sooner.  Performed at [facility], [place] [address], [place], [place] [address]   Resp Panel by RT-PCR (Flu A&B, Covid) Nasopharyngeal Swab     Status: None   Collection Time: 04/17/20  4:48 PM   Specimen: Nasopharyngeal Swab;  Nasopharyngeal(NP) swabs in vial transport medium  Result Value Ref Range Status   SARS Coronavirus 2 by RT PCR NEGATIVE NEGATIVE Final    Comment: (NOTE) SARS-CoV-2 target nucleic acids are NOT DETECTED.  The SARS-CoV-2 RNA is generally detectable in upper respiratory specimens during the acute phase of infection. The lowest concentration of SARS-CoV-2 viral copies this assay can detect is 138 copies/mL. A negative result does not preclude SARS-Cov-2 infection and should not be used as the sole basis for treatment or other patient management decisions. A negative result may occur with  improper specimen collection/handling, submission of specimen other than nasopharyngeal swab, presence of viral mutation(s) within the areas targeted by this assay, and inadequate number of viral copies(<138 copies/mL). A negative result must be combined with clinical observations, patient history, and epidemiological information. The expected result is Negative.  Fact Sheet for Patients:  EntrepreneurPulse.com.au  Fact Sheet for  Healthcare Providers:  IncredibleEmployment.be  This test is no t yet approved or cleared by the [place] FDA and  has been authorized for detection and/or diagnosis of SARS-CoV-2 by FDA under an Emergency Use Authorization (EUA). This EUA will remain  in effect (meaning this test can be used) for the duration of the COVID-19 declaration under Section 564(b)(1) of the Act, 21 U.S.C.section 360bbb-3(b)(1), unless the authorization is terminated  or revoked sooner.       Influenza A by PCR NEGATIVE NEGATIVE Final   Influenza B by PCR NEGATIVE NEGATIVE Final    Comment: (NOTE) The Xpert Xpress SARS-CoV-2/FLU/RSV plus assay is intended as an aid in the diagnosis of influenza from Nasopharyngeal swab specimens and should not be used as a sole basis for treatment. Nasal washings and aspirates are unacceptable for Xpert Xpress SARS-CoV-2/FLU/RSV testing.  Fact Sheet for Patients: EntrepreneurPulse.com.au  Fact Sheet for Healthcare Providers: IncredibleEmployment.be  This test is not yet approved or cleared by the [place] FDA and has been authorized for detection and/or diagnosis of SARS-CoV-2 by FDA under an Emergency Use Authorization (EUA). This EUA will remain in effect (meaning this test can be used) for the duration of the COVID-19 declaration under Section 564(b)(1) of the Act, 21 U.S.C. section 360bbb-3(b)(1), unless the authorization is terminated or revoked.  Performed at [facility], [place] [address], [place], [place] [address]          Radiology Studies: DG Chest 1 View  Result Date: 04/16/2020 CLINICAL DATA:  Status post right thoracentesis EXAM: CHEST  1 VIEW COMPARISON:  04/12/2020 FINDINGS: Significant interval decrease in volume of right pleural effusion, now small and layering. No pneumothorax. No new airspace opacity. Unchanged mild cardiomegaly. IMPRESSION: Significant interval decrease  in volume of right pleural effusion, now small and layering. No pneumothorax. No new airspace opacity. Electronically Signed   By: Eddie Candle M.D.   On: 04/16/2020 16:33   DG Chest 2 View  Result Date: 04/17/2020 CLINICAL DATA:  Worsening shortness of breath EXAM: CHEST - 2 VIEW COMPARISON:  01/17/2020, 04/16/2020 FINDINGS: Diffuse hazy opacity throughout the right thorax, suspect component of layering pleural effusion. Cardiomegaly with vascular congestion and probable mild pulmonary edema. Possible small left effusion. Dense airspace disease at the right base. No pneumothorax. Curvilinear wirelike opacity over the left clavicular area, possible external artifact, correlate with physical exam. IMPRESSION: Cardiomegaly with vascular congestion probable mild pulmonary edema. Hazy opacity throughout the right thorax, suspect component of layering pleural effusion. Dense airspace disease at the right base, atelectasis versus pneumonia, worsened aeration at right  base since 04/16/2020 comparison. Electronically Signed   By: Donavan Foil M.D.   On: 04/17/2020 15:18   US THORACENTESIS ASP PLEURAL SPACE W/IMG GUIDE  Result Date: 04/16/2020 INDICATION: Patient with history of hypertension, diabetes, CHF, chronic kidney disease, dyspnea, recurrent right pleural effusion; request received for diagnostic and therapeutic right thoracentesis. EXAM: ULTRASOUND GUIDED DIAGNOSTIC AND THERAPEUTIC RIGHT THORACENTESIS MEDICATIONS: 1% lidocaine to skin and subcutaneous tissue COMPLICATIONS: None immediate. PROCEDURE: An ultrasound guided thoracentesis was thoroughly discussed with the patient and questions answered. The benefits, risks, alternatives and complications were also discussed. The patient understands and wishes to proceed with the procedure. Written consent was obtained. Ultrasound was performed to localize and mark an adequate pocket of fluid in the right chest. The area was then prepped and draped in the  normal sterile fashion. 1% Lidocaine was used for local anesthesia. Under ultrasound guidance a 6 Fr Safe-T-Centesis catheter was introduced. Thoracentesis was performed. The catheter was removed and a dressing applied. FINDINGS: A total of approximately 2 liters of hazy,amber fluid was removed. Samples were sent to the laboratory as requested by the clinical team. IMPRESSION: Successful ultrasound guided diagnostic and therapeutic right thoracentesis yielding 2 liters of pleural fluid. Read by: Rowe Robert, PA-C Electronically Signed   By: Aletta Edouard M.D.   On: 04/16/2020 16:14        Scheduled Meds: . amLODipine  10 mg Oral Daily  . aspirin EC  81 mg Oral Daily  . calcitRIOL  0.25 mcg Oral Daily  . calcium carbonate  2 tablet Oral Daily  . carvedilol  12.5 mg Oral BID WC  . gabapentin  300 mg Oral BID  . heparin  5,000 Units Subcutaneous Q8H  . hydrALAZINE  50 mg Oral TID  . insulin aspart  0-6 Units Subcutaneous TID WC  . isosorbide mononitrate  60 mg Oral QPM   Continuous Infusions: . azithromycin Stopped (04/18/20 0119)  . cefTRIAXone (ROCEPHIN)  IV Stopped (04/18/20 0005)  . furosemide 120 mg (04/18/20 0832)     LOS: 1 day        Hosie Poisson, MD [facility]   To contact the attending provider between 7A-7P or the covering provider during after hours 7P-7A, please log into the web site [email or web address] and access using universal [place] password for that web site. If you do not have the password, please call the hospital operator.  04/18/2020, 9:17 AM

## 2020-04-18 NOTE — Progress Notes (Signed)
Inpatient Diabetes Program Recommendations  AACE/ADA: New Consensus Statement on Inpatient Glycemic Control (2015)  Target Ranges:  Prepandial:   less than 140 mg/dL      Peak postprandial:   less than 180 mg/dL (1-2 hours)      Critically ill patients:  140 - 180 mg/dL   Lab Results  Component Value Date   GLUCAP 205 (H) 04/18/2020   HGBA1C 7.3 (H) 04/18/2020    Review of Glycemic Control Results for RENDELL, THIVIERGE (MRN [id number]) as of 04/18/2020 14:27  Ref. Range 04/17/2020 23:39 04/18/2020 07:34 04/18/2020 11:40  Glucose-Capillary Latest Ref Range: 70 - 99 mg/dL 212 (H) 116 (H) 205 (H)  Results for ROMANI, WILBON (MRN [id number]) as of 04/18/2020 14:27  Ref. Range 04/18/2020 02:35  Hemoglobin A1C Latest Ref Range: 4.8 - 5.6 % 7.3 (H)   Diabetes history:  DM2 Outpatient Diabetes medications:  none Current orders for Inpatient glycemic control:  Novolog 0-6 units TID  Note: Spoke with Mr. Wortmann and wife at bedside at his bedside.  Reviewed patient's current A1c of 7.3%. Explained what a A1c is and what it measures. Also reviewed goal A1c with patient, importance of good glucose control @ home, and blood sugar goals.  He states he used to be on Lantus and Metformin several years ago and was able to come off of medications.  He is currently not taking any medications.  Congratulated him on A1c and diet.  He states he does not drink anything with sugar and follows a strict diabetic diet.  Would recommend monitoring CBG's at home once a day.  Please order a Glucometer at discharge (order # [id number]).  Might consider Tradjenta 5 mg if wanting to discharge on oral diabetes medication.    Will continue to follow while inpatient.  Thank you, Reche Dixon, RN, BSN Diabetes Coordinator Inpatient Diabetes Program [phone] (team pager from 8a-5p)

## 2020-04-18 NOTE — ED Notes (Signed)
Pt refuses insulin

## 2020-04-19 LAB — BASIC METABOLIC PANEL
Anion gap: 13 (ref 5–15)
BUN: 101 mg/dL — ABNORMAL HIGH (ref 8–23)
CO2: 32 mmol/L (ref 22–32)
Calcium: 7.8 mg/dL — ABNORMAL LOW (ref 8.9–10.3)
Chloride: 95 mmol/L — ABNORMAL LOW (ref 98–111)
Creatinine, Ser: 4.14 mg/dL — ABNORMAL HIGH (ref 0.61–1.24)
GFR, Estimated: 15 mL/min — ABNORMAL LOW (ref >60)
Glucose, Bld: 192 mg/dL — ABNORMAL HIGH (ref 70–99)
Potassium: 4 mmol/L (ref 3.5–5.1)
Sodium: 140 mmol/L (ref 135–145)

## 2020-04-19 LAB — CBC
HCT: 32.8 % — ABNORMAL LOW (ref 39.0–52.0)
Hemoglobin: 10.7 g/dL — ABNORMAL LOW (ref 13.0–17.0)
MCH: 27.5 pg (ref 26.0–34.0)
MCHC: 32.6 g/dL (ref 30.0–36.0)
MCV: 84.3 fL (ref 80.0–100.0)
Platelets: 274 10*3/uL (ref 150–400)
RBC: 3.89 MIL/uL — ABNORMAL LOW (ref 4.22–5.81)
RDW: 15.5 % (ref 11.5–15.5)
WBC: 7.6 10*3/uL (ref 4.0–10.5)
nRBC: 0 % (ref 0.0–0.2)

## 2020-04-19 LAB — LEGIONELLA PNEUMOPHILA SEROGP 1 UR AG: L. pneumophila Serogp 1 Ur Ag: NEGATIVE

## 2020-04-19 LAB — GLUCOSE, CAPILLARY
Glucose-Capillary: 192 mg/dL — ABNORMAL HIGH (ref 70–99)
Glucose-Capillary: 239 mg/dL — ABNORMAL HIGH (ref 70–99)
Glucose-Capillary: 108 mg/dL — ABNORMAL HIGH (ref 70–99)
Glucose-Capillary: 215 mg/dL — ABNORMAL HIGH (ref 70–99)

## 2020-04-19 LAB — PROCALCITONIN: Procalcitonin: <0.1 ng/mL

## 2020-04-19 MED ORDER — FUROSEMIDE 10 MG/ML IJ SOLN
80.0000 mg | Freq: Two times a day (BID) | INTRAMUSCULAR | Status: DC
  Administered 2020-04-19 – 2020-04-20 (×2): 80 mg via INTRAVENOUS
  Filled 2020-04-19 (×2): qty 8

## 2020-04-19 MED ORDER — MELATONIN 3 MG PO TABS
3.0000 mg | ORAL_TABLET | Freq: Every day | ORAL | Status: DC
  Administered 2020-04-19 – 2020-04-20 (×2): 3 mg via ORAL
  Filled 2020-04-19 (×2): qty 1

## 2020-04-19 NOTE — Progress Notes (Signed)
RN received call from [facility] stating pt had 6 beat run of Vtach. Pt asymptomatic, talking on his cell phone with wife at bedside. BP 134/70 HR 69. Will continue to monitor.

## 2020-04-19 NOTE — Progress Notes (Signed)
PROGRESS NOTE    Melvin Mcclure  DQQ:[id number] DOB: [birth year] DOA: 04/17/2020 PCP: Nolene Ebbs, MD    Chief Complaint  Patient presents with  . Post-op Problem  . Shortness of Breath    Brief Narrative:  69 year old gentleman prior history of stage IV CKD, heart failure with preserved EF, type 2 diabetes mellitus and hypertension presents with worsening shortness of breath and productive cough.  Patient underwent right thoracentesis on 04/17/2019 2 L of fluid was removed despite that patient continues to have worsening shortness of breath and presented to the ED.  On arrival to ED he was hypoxic and tachypneic.  Chest x-ray showed notable for vascular congestion and mild pulmonary edema, hazy opacity throughout the right thorax suspicious for layering pleural effusion, increased density at the right base, suspicious for pneumonia versus atelectasis.  Patient was admitted to [facility] for acute respiratory failure with hypoxia requiring up to 2 L of nasal cannula oxygen.  Nephrology consulted, and he was started on high dose diuretics. CT chest with out contrast showed Right lower lobe infiltrate/consolidation/collapse. Some dependent infiltrate/volume loss in the right upper lobe. Moderate hazy pneumonia in the right middle lobe. Moderate size right effusion layering dependently without evidence of loculation. Findings may be due 2 pneumonia, but the possibility of tumor in the right inferior hilar region is not excluded by this exam. Recommend outpatient follow up with Dr Melvyn Novas with pulmonology in 2 to 4 weeks to evaluate for resolution of the pneumonia and evaluation of tumor.   Assessment & Plan:   Principal Problem:   Acute on chronic respiratory failure with hypoxia (HCC) Active Problems:   Acute on chronic diastolic CHF (congestive heart failure) (HCC)   DM2 (diabetes mellitus, type 2) (HCC)   CKD stage 4 due to type 2 diabetes mellitus (HCC)   Hypertensive cardiomyopathy, with heart  failure (HCC)   CAP (community acquired pneumonia)   Pleural effusion   HTN (hypertension)  Acute hypoxic respiratory failure requiring 2 lit/mi nasal cannula oxygen to keep sats greater than 90%. Probably secondary to a combination of pulmonary edema, from acute on chronic diastolic heart failure, worsening stage IV CKD , right pleural effusion, possible community-acquired pneumonia,  Patient underwent thoracentesis as outpatient on 04/16/2020. Transudate . Cytology showed no malignant cells. He was started on IV Rocephin and Zithromax for possible community-acquired pneumonia. Recommend to continue with IV antibiotics for another 24 to 48 hours and wean him off the oxygen in the next 1 to 2 days. He was also started on IV lasix 120 mg BID for pulm edema, 2.7 L diuresis yesterday, creatinine stable at 4.1 and nephrology recommended decreasing the IV Lasix to 80 mg twice daily. CT of the chest without contrast shows pneumonia in the right middle lobe, right lower lobe infiltrate/consolidation/collapse with moderate right sided effusion without evidence of loculation. Recommend outpatient follow-up with Dr. Leonides Schanz with pulmonology in 2 to 4 weeks to evaluate for repeat imaging and resolution of the pneumonia Urine fore strep pneumo antigen is negative. Pro calcitonin is less than 0.10    Stage 4 CKD:  Baseline creatinine is between 3.5 and 4. Stable around 4.1   Hypertension: Blood pressure parameters are well controlled  Anemia of chronic disease probably from CRF.  Hemoglobin of 10.8 this am.    Type 2 DM;  CBG (last 3)  Recent Labs    04/18/20 2124 04/19/20 0819 04/19/20 1216  GLUCAP 209* 192* 239*   Resume SSI. Hemoglobin A1c is 7.3. No changes  in medications at this time.    DVT prophylaxis: Heparin Code Status:  full code  family Communication: Family at bedside  disposition:   Status is: Inpatient  Remains inpatient appropriate because:Ongoing diagnostic testing  needed not appropriate for outpatient work up, IV treatments appropriate due to intensity of illness or inability to take PO and Inpatient level of care appropriate due to severity of illness   Dispo: The patient is from: Home              Anticipated d/c is to: Pending              Anticipated d/c date is: 3 days              Patient currently is not medically stable to d/c.       Consultants:   Nephrology   Procedures: Underwent ultrasound thoracentesis on 04/16/20   Antimicrobials: IV Rocephin and Zithromax for pneumonia   Subjective: Patient reports breathing is improved and wants to know when he can go home  Objective: Vitals:   04/18/20 1706 04/18/20 2040 04/19/20 0326 04/19/20 1019  BP: 140/72 139/73 (!) 143/75 131/69  Pulse: 69 65 71 70  Resp: 18 17 17 16   Temp: 98.1 F (36.7 C) 97.9 F (36.6 C) 98.1 F (36.7 C) 98.5 F (36.9 C)  TempSrc: Oral Oral Oral Oral  SpO2: 94% 95% 97% 98%  Weight:   89.1 kg   Height:        Intake/Output Summary (Last 24 hours) at 04/19/2020 1222 Last data filed at 04/19/2020 0900 Gross per 24 hour  Intake 1307.96 ml  Output 1425 ml  Net -117.04 ml   Filed Weights   04/17/20 1617 04/19/20 0326  Weight: 87.3 kg 89.1 kg    Examination:  General exam: Alert and cooperative., On 2 L of nasal cannula oxygen Respiratory system: Diminished air entry more on the right with some rails, no tachypnea on 3 L of nasal cannula oxygen Cardiovascular system: S1-S2 heard, regular rate rhythm, no JVD, no pedal edema Gastrointestinal system: Abdomen is soft, nontender, nondistended, bowel sounds normal Central nervous system: Alert and oriented, grossly nonfocal Extremities: No pedal edema, cyanosis Skin: No rashes seen Psychiatry: Mood is appropriate    Data Reviewed: I have personally reviewed following labs and imaging studies  CBC: Recent Labs  Lab 04/17/20 1453 04/18/20 0225 04/19/20 0139  WBC 8.2 7.5 7.6  HGB 11.1*  10.8* 10.7*  HCT 36.4* 35.0* 32.8*  MCV 86.3 86.4 84.3  PLT 255 239 062    Basic Metabolic Panel: Recent Labs  Lab 04/17/20 1453 04/18/20 0225 04/19/20 0139  NA 140 139 140  K 3.7 3.7 4.0  CL 95* 95* 95*  CO2 28 32 32  GLUCOSE 186* 162* 192*  BUN 103* 106* 101*  CREATININE 4.18* 4.14* 4.14*  CALCIUM 7.8* 7.6* 7.8*    GFR: Estimated Creatinine Clearance: 18.6 mL/min (A) (by C-G formula based on SCr of 4.14 mg/dL (H)).  Liver Function Tests: Recent Labs  Lab 04/17/20 2245  AST 11*  ALT 12  ALKPHOS 84  BILITOT 0.8  PROT 7.3  ALBUMIN 3.0*    CBG: Recent Labs  Lab 04/18/20 1140 04/18/20 1659 04/18/20 2124 04/19/20 0819 04/19/20 1216  GLUCAP 205* 99 209* 192* 239*     Recent Results (from the past 240 hour(s))  SARS CORONAVIRUS 2 (TAT 6-24 HRS) Nasopharyngeal Nasopharyngeal Swab     Status: None   Collection Time: 04/14/20  1:58 PM  Specimen: Nasopharyngeal Swab  Result Value Ref Range Status   SARS Coronavirus 2 NEGATIVE NEGATIVE Final    Comment: (NOTE) SARS-CoV-2 target nucleic acids are NOT DETECTED.  The SARS-CoV-2 RNA is generally detectable in upper and lower respiratory specimens during the acute phase of infection. Negative results do not preclude SARS-CoV-2 infection, do not rule out co-infections with other pathogens, and should not be used as the sole basis for treatment or other patient management decisions. Negative results must be combined with clinical observations, patient history, and epidemiological information. The expected result is Negative.  Fact Sheet for Patients: SugarRoll.be  Fact Sheet for Healthcare Providers: https://www.woods-mathews.com/  This test is not yet approved or cleared by the [place] FDA and  has been authorized for detection and/or diagnosis of SARS-CoV-2 by FDA under an Emergency Use Authorization (EUA). This EUA will remain  in effect (meaning this test can  be used) for the duration of the COVID-19 declaration under Se ction 564(b)(1) of the Act, 21 U.S.C. section 360bbb-3(b)(1), unless the authorization is terminated or revoked sooner.  Performed at [facility], [place] [address] [place], [place] [address]   Resp Panel by RT-PCR (Flu A&B, Covid) Nasopharyngeal Swab     Status: None   Collection Time: 04/17/20  4:48 PM   Specimen: Nasopharyngeal Swab; Nasopharyngeal(NP) swabs in vial transport medium  Result Value Ref Range Status   SARS Coronavirus 2 by RT PCR NEGATIVE NEGATIVE Final    Comment: (NOTE) SARS-CoV-2 target nucleic acids are NOT DETECTED.  The SARS-CoV-2 RNA is generally detectable in upper respiratory specimens during the acute phase of infection. The lowest concentration of SARS-CoV-2 viral copies this assay can detect is 138 copies/mL. A negative result does not preclude SARS-Cov-2 infection and should not be used as the sole basis for treatment or other patient management decisions. A negative result may occur with  improper specimen collection/handling, submission of specimen other than nasopharyngeal swab, presence of viral mutation(s) within the areas targeted by this assay, and inadequate number of viral copies(<138 copies/mL). A negative result must be combined with clinical observations, patient history, and epidemiological information. The expected result is Negative.  Fact Sheet for Patients:  EntrepreneurPulse.com.au  Fact Sheet for Healthcare Providers:  IncredibleEmployment.be  This test is no t yet approved or cleared by the [place] FDA and  has been authorized for detection and/or diagnosis of SARS-CoV-2 by FDA under an Emergency Use Authorization (EUA). This EUA will remain  in effect (meaning this test can be used) for the duration of the COVID-19 declaration under Section 564(b)(1) of the Act, 21 U.S.C.section 360bbb-3(b)(1), unless the authorization  is terminated  or revoked sooner.       Influenza A by PCR NEGATIVE NEGATIVE Final   Influenza B by PCR NEGATIVE NEGATIVE Final    Comment: (NOTE) The Xpert Xpress SARS-CoV-2/FLU/RSV plus assay is intended as an aid in the diagnosis of influenza from Nasopharyngeal swab specimens and should not be used as a sole basis for treatment. Nasal washings and aspirates are unacceptable for Xpert Xpress SARS-CoV-2/FLU/RSV testing.  Fact Sheet for Patients: EntrepreneurPulse.com.au  Fact Sheet for Healthcare Providers: IncredibleEmployment.be  This test is not yet approved or cleared by the [place] FDA and has been authorized for detection and/or diagnosis of SARS-CoV-2 by FDA under an Emergency Use Authorization (EUA). This EUA will remain in effect (meaning this test can be used) for the duration of the COVID-19 declaration under Section 564(b)(1) of the Act, 21 U.S.C.  section 360bbb-3(b)(1), unless the authorization is terminated or revoked.  Performed at [facility], [place] [address], [place], [place] [address]          Radiology Studies: DG Chest 2 View  Result Date: 04/17/2020 CLINICAL DATA:  Worsening shortness of breath EXAM: CHEST - 2 VIEW COMPARISON:  01/17/2020, 04/16/2020 FINDINGS: Diffuse hazy opacity throughout the right thorax, suspect component of layering pleural effusion. Cardiomegaly with vascular congestion and probable mild pulmonary edema. Possible small left effusion. Dense airspace disease at the right base. No pneumothorax. Curvilinear wirelike opacity over the left clavicular area, possible external artifact, correlate with physical exam. IMPRESSION: Cardiomegaly with vascular congestion probable mild pulmonary edema. Hazy opacity throughout the right thorax, suspect component of layering pleural effusion. Dense airspace disease at the right base, atelectasis versus pneumonia, worsened aeration at right base since  04/16/2020 comparison. Electronically Signed   By: Donavan Foil M.D.   On: 04/17/2020 15:18   CT CHEST WO CONTRAST  Result Date: 04/18/2020 CLINICAL DATA:  Pneumonia.  Question abscess or empyema. EXAM: CT CHEST WITHOUT CONTRAST TECHNIQUE: Multidetector CT imaging of the chest was performed following the standard protocol without IV contrast. COMPARISON:  Chest radiography yesterday.  Chest CT 11/01/2019 FINDINGS: Cardiovascular: Mild cardiomegaly. No pericardial effusion. Coronary artery calcification is present. Aortic atherosclerotic calcification is present. Mediastinum/Nodes: No mediastinal mass. No paratracheal lymphadenopathy. Adenopathy is likely present in the right hilar region, presumed reactive to the right lower lobe inflammatory disease. Mass lesion not excluded based on this exam. Lungs/Pleura: Left lung shows dependent atelectasis or infiltrate in the left lower lobe. Left upper lobe is clear. Right chest shows a moderate size effusion layering dependently. No evidence of loculation. There is infiltrate/consolidation/collapse affecting the right lower lobe. Some dependent infiltrate/volume loss present in the right upper lobe. Moderate hazy pneumonia in the right middle lobe. Upper Abdomen: Negative Musculoskeletal: Ordinary thoracic degenerative changes. IMPRESSION: 1. Right lower lobe infiltrate/consolidation/collapse. Some dependent infiltrate/volume loss in the right upper lobe. Moderate hazy pneumonia in the right middle lobe. Moderate size right effusion layering dependently without evidence of loculation. Findings may be due 2 pneumonia, but the possibility of tumor in the right inferior hilar region is not excluded by this exam. 2. Dependent atelectasis or infiltrate in the left lower lobe. 3. Aortic atherosclerosis. Coronary artery calcification. Aortic Atherosclerosis (ICD10-I70.0). Electronically Signed   By: Nelson Chimes M.D.   On: 04/18/2020 12:51        Scheduled Meds: .  amLODipine  10 mg Oral Daily  . aspirin EC  81 mg Oral Daily  . calcitRIOL  0.25 mcg Oral Daily  . calcium carbonate  2 tablet Oral Daily  . carvedilol  12.5 mg Oral BID WC  . furosemide  80 mg Intravenous Q12H  . gabapentin  300 mg Oral BID  . heparin  5,000 Units Subcutaneous Q8H  . hydrALAZINE  50 mg Oral TID  . insulin aspart  0-6 Units Subcutaneous TID WC  . isosorbide mononitrate  60 mg Oral QPM  . melatonin  3 mg Oral QHS   Continuous Infusions: . sodium chloride 10 mL/hr at 04/19/20 0459  . azithromycin 500 mg (04/18/20 2235)  . cefTRIAXone (ROCEPHIN)  IV 2 g (04/18/20 2125)     LOS: 2 days        Hosie Poisson, MD [facility]   To contact the attending provider between 7A-7P or the covering provider during after hours 7P-7A, please log into the web site [email or web address] and  access using universal [place] password for that web site. If you do not have the password, please call the hospital operator.  04/19/2020, 12:22 PM

## 2020-04-19 NOTE — Progress Notes (Signed)
Patient ID: Melvin Mcclure, male   DOB: [birth year], 69 y.o.   MRN: [id number] [facility] Progress Note   Assessment/ Plan:   1.  Acute kidney injury on chronic kidney disease stage IV: Appears hemodynamically mediated acute kidney injury in the setting of CHF exacerbation/reexpansion pulmonary edema following thoracentesis.    Overnight, renal function essentially unchanged with net negative fluid balance-good response to diuretic.  I will decrease IV furosemide to 80 mg twice daily and monitor input/output along with clinical status and recheck labs tomorrow morning to help direct further management. 2.  Acute hypoxic respiratory failure: This appears to be consistent with reexpansion pulmonary edema/acute exacerbation of diastolic heart failure and recurrent pleural right pleural effusion.  He appears to be slightly improving from a clinical status, continue weaning off of oxygen as we undertake diuresis (so far net -2.4 L).  CT scan yesterday showed is a pneumonia in RML along with nonloculated pleural effusion and some pulmonary edema.  Possibility raised of tumor in right inferior hilar region-I suspect this will be followed up as an outpatient with repeat imaging after resolution of the acute process. 3.  Hypertension: Blood pressures currently within acceptable range, will continue him on his outpatient regimen including amlodipine, carvedilol and hydralazine which we undertake diuresis. 4.  Anemia of chronic kidney disease: Without overt loss, will continue to monitor for indications for ESA. 5.  Secondary hyperparathyroidism: I will continue to monitor his calcium/phosphorus trends on labs and resume calcitriol for PTH control. 6.  Insomnia: We will prescribe for melatonin tonight.  Subjective:   Desaturation with movement noted overnight.  He reports sleeping poorly overnight and currently feels tired/groggy and somewhat disoriented.  His wife at bedside corroborates this.    Objective:   BP 131/69 (BP Location: Left Arm)   Pulse 70   Temp 98.5 F (36.9 C) (Oral)   Resp 16   Ht 5\' 10"  (1.778 m)   Wt 89.1 kg   SpO2 98%   BMI 28.18 kg/m   Intake/Output Summary (Last 24 hours) at 04/19/2020 1038 Last data filed at 04/19/2020 0900 Gross per 24 hour  Intake 1307.96 ml  Output 2125 ml  Net -817.04 ml   Weight change: 1.769 kg  Physical Exam: Gen: Appears to be somnolent resting in bed, intermittently awake. CVS: Pulse regular rhythm, normal rate, S1 and S2 normal Resp: Fine rales right base otherwise clear to auscultation.  No wheeze/rhonchi Abd: Soft, slightly distended, nontender.  Bowel sounds normal Ext: No lower extremity edema  Imaging: DG Chest 2 View  Result Date: 04/17/2020 CLINICAL DATA:  Worsening shortness of breath EXAM: CHEST - 2 VIEW COMPARISON:  01/17/2020, 04/16/2020 FINDINGS: Diffuse hazy opacity throughout the right thorax, suspect component of layering pleural effusion. Cardiomegaly with vascular congestion and probable mild pulmonary edema. Possible small left effusion. Dense airspace disease at the right base. No pneumothorax. Curvilinear wirelike opacity over the left clavicular area, possible external artifact, correlate with physical exam. IMPRESSION: Cardiomegaly with vascular congestion probable mild pulmonary edema. Hazy opacity throughout the right thorax, suspect component of layering pleural effusion. Dense airspace disease at the right base, atelectasis versus pneumonia, worsened aeration at right base since 04/16/2020 comparison. Electronically Signed   By: Donavan Foil M.D.   On: 04/17/2020 15:18   CT CHEST WO CONTRAST  Result Date: 04/18/2020 CLINICAL DATA:  Pneumonia.  Question abscess or empyema. EXAM: CT CHEST WITHOUT CONTRAST TECHNIQUE: Multidetector CT imaging of the chest was performed following the standard protocol without  IV contrast. COMPARISON:  Chest radiography yesterday.  Chest CT 11/01/2019 FINDINGS:  Cardiovascular: Mild cardiomegaly. No pericardial effusion. Coronary artery calcification is present. Aortic atherosclerotic calcification is present. Mediastinum/Nodes: No mediastinal mass. No paratracheal lymphadenopathy. Adenopathy is likely present in the right hilar region, presumed reactive to the right lower lobe inflammatory disease. Mass lesion not excluded based on this exam. Lungs/Pleura: Left lung shows dependent atelectasis or infiltrate in the left lower lobe. Left upper lobe is clear. Right chest shows a moderate size effusion layering dependently. No evidence of loculation. There is infiltrate/consolidation/collapse affecting the right lower lobe. Some dependent infiltrate/volume loss present in the right upper lobe. Moderate hazy pneumonia in the right middle lobe. Upper Abdomen: Negative Musculoskeletal: Ordinary thoracic degenerative changes. IMPRESSION: 1. Right lower lobe infiltrate/consolidation/collapse. Some dependent infiltrate/volume loss in the right upper lobe. Moderate hazy pneumonia in the right middle lobe. Moderate size right effusion layering dependently without evidence of loculation. Findings may be due 2 pneumonia, but the possibility of tumor in the right inferior hilar region is not excluded by this exam. 2. Dependent atelectasis or infiltrate in the left lower lobe. 3. Aortic atherosclerosis. Coronary artery calcification. Aortic Atherosclerosis (ICD10-I70.0). Electronically Signed   By: Nelson Chimes M.D.   On: 04/18/2020 12:51    Labs: BMET Recent Labs  Lab 04/17/20 1453 04/18/20 0225 04/19/20 0139  NA 140 139 140  K 3.7 3.7 4.0  CL 95* 95* 95*  CO2 28 32 32  GLUCOSE 186* 162* 192*  BUN 103* 106* 101*  CREATININE 4.18* 4.14* 4.14*  CALCIUM 7.8* 7.6* 7.8*   CBC Recent Labs  Lab 04/17/20 1453 04/18/20 0225 04/19/20 0139  WBC 8.2 7.5 7.6  HGB 11.1* 10.8* 10.7*  HCT 36.4* 35.0* 32.8*  MCV 86.3 86.4 84.3  PLT 255 239 274    Medications:    .  amLODipine  10 mg Oral Daily  . aspirin EC  81 mg Oral Daily  . calcitRIOL  0.25 mcg Oral Daily  . calcium carbonate  2 tablet Oral Daily  . carvedilol  12.5 mg Oral BID WC  . furosemide  80 mg Intravenous Q12H  . gabapentin  300 mg Oral BID  . heparin  5,000 Units Subcutaneous Q8H  . hydrALAZINE  50 mg Oral TID  . insulin aspart  0-6 Units Subcutaneous TID WC  . isosorbide mononitrate  60 mg Oral QPM   Elmarie Shiley, MD 04/19/2020, 10:38 AM

## 2020-04-20 ENCOUNTER — Inpatient Hospital Stay (HOSPITAL_COMMUNITY): Payer: Medicare Other

## 2020-04-20 LAB — CBC
HCT: 36.8 % — ABNORMAL LOW (ref 39.0–52.0)
Hemoglobin: 11.3 g/dL — ABNORMAL LOW (ref 13.0–17.0)
MCH: 26.5 pg (ref 26.0–34.0)
MCHC: 30.7 g/dL (ref 30.0–36.0)
MCV: 86.4 fL (ref 80.0–100.0)
Platelets: 266 10*3/uL (ref 150–400)
RBC: 4.26 MIL/uL (ref 4.22–5.81)
RDW: 15.4 % (ref 11.5–15.5)
WBC: 8 10*3/uL (ref 4.0–10.5)
nRBC: 0 % (ref 0.0–0.2)

## 2020-04-20 LAB — RENAL FUNCTION PANEL
Albumin: 3.1 g/dL — ABNORMAL LOW (ref 3.5–5.0)
Anion gap: 16 — ABNORMAL HIGH (ref 5–15)
BUN: 98 mg/dL — ABNORMAL HIGH (ref 8–23)
CO2: 33 mmol/L — ABNORMAL HIGH (ref 22–32)
Calcium: 7.9 mg/dL — ABNORMAL LOW (ref 8.9–10.3)
Chloride: 93 mmol/L — ABNORMAL LOW (ref 98–111)
Creatinine, Ser: 3.92 mg/dL — ABNORMAL HIGH (ref 0.61–1.24)
GFR, Estimated: 16 mL/min — ABNORMAL LOW (ref >60)
Glucose, Bld: 160 mg/dL — ABNORMAL HIGH (ref 70–99)
Phosphorus: 6.3 mg/dL — ABNORMAL HIGH (ref 2.5–4.6)
Potassium: 3.9 mmol/L (ref 3.5–5.1)
Sodium: 142 mmol/L (ref 135–145)

## 2020-04-20 LAB — GLUCOSE, CAPILLARY
Glucose-Capillary: 153 mg/dL — ABNORMAL HIGH (ref 70–99)
Glucose-Capillary: 133 mg/dL — ABNORMAL HIGH (ref 70–99)
Glucose-Capillary: 196 mg/dL — ABNORMAL HIGH (ref 70–99)
Glucose-Capillary: 229 mg/dL — ABNORMAL HIGH (ref 70–99)

## 2020-04-20 MED ORDER — CALCIUM ACETATE (PHOS BINDER) 667 MG PO CAPS
667.0000 mg | ORAL_CAPSULE | Freq: Three times a day (TID) | ORAL | Status: DC
  Administered 2020-04-20 – 2020-04-21 (×4): 667 mg via ORAL
  Filled 2020-04-20 (×4): qty 1

## 2020-04-20 MED ORDER — SENNOSIDES-DOCUSATE SODIUM 8.6-50 MG PO TABS
2.0000 | ORAL_TABLET | Freq: Two times a day (BID) | ORAL | Status: DC
  Administered 2020-04-20: 21:00:00 2 via ORAL
  Filled 2020-04-20 (×2): qty 2

## 2020-04-20 MED ORDER — FUROSEMIDE 10 MG/ML IJ SOLN: 40.0000 mg | Freq: Two times a day (BID) | INTRAMUSCULAR | Status: DC

## 2020-04-20 MED ORDER — BISACODYL 5 MG PO TBEC
5.0000 mg | DELAYED_RELEASE_TABLET | Freq: Every day | ORAL | Status: DC | PRN
  Administered 2020-04-20: 17:00:00 5 mg via ORAL
  Filled 2020-04-20: qty 1

## 2020-04-20 MED ORDER — TORSEMIDE 20 MG PO TABS
40.0000 mg | ORAL_TABLET | Freq: Two times a day (BID) | ORAL | Status: DC
  Administered 2020-04-20 – 2020-04-21 (×2): 40 mg via ORAL
  Filled 2020-04-20 (×2): qty 2

## 2020-04-20 NOTE — Care Management Important Message (Signed)
Important Message  Patient Details  Name: Melvin Mcclure MRN: [id number] Date of Birth: April 20, 1951   Medicare Important Message Given:  Yes     Shelda Altes 04/20/2020, 10:16 AM

## 2020-04-20 NOTE — Progress Notes (Signed)
PROGRESS NOTE    Melvin Mcclure  VEL:[id number] DOB: 1950-09-22 DOA: 04/17/2020 PCP: Nolene Ebbs, MD    Chief Complaint  Patient presents with  . Post-op Problem  . Shortness of Breath    Brief Narrative:  69 year old gentleman prior history of stage IV CKD, heart failure with preserved EF, type 2 diabetes mellitus and hypertension presents with worsening shortness of breath and productive cough.  Patient underwent right thoracentesis on 04/17/2019 2 L of fluid was removed despite that patient continues to have worsening shortness of breath and presented to the ED.  On arrival to ED he was hypoxic and tachypneic.  Chest x-ray showed notable for vascular congestion and mild pulmonary edema, hazy opacity throughout the right thorax suspicious for layering pleural effusion, increased density at the right base, suspicious for pneumonia versus atelectasis.  Patient was admitted to [facility] for acute respiratory failure with hypoxia requiring up to 2 L of nasal cannula oxygen.  Nephrology consulted, and he was started on high dose diuretics. CT chest with out contrast showed Right lower lobe infiltrate/consolidation/collapse. Some dependent infiltrate/volume loss in the right upper lobe. Moderate hazy pneumonia in the right middle lobe. Moderate size right effusion layering dependently without evidence of loculation. Findings may be due 2 pneumonia, but the possibility of tumor in the right inferior hilar region is not excluded by this exam. Recommend outpatient follow up with Dr Melvyn Novas with pulmonology in 2 to 4 weeks to evaluate for resolution of the pneumonia and evaluation of tumor.  Patient seen and examined and wean his oxygen down to 1 L  nasal cannula.  Patient denies any chest pain or worsening shortness of breath  Assessment & Plan:   Principal Problem:   Acute on chronic respiratory failure with hypoxia (HCC) Active Problems:   Acute on chronic diastolic CHF (congestive heart failure)  (HCC)   DM2 (diabetes mellitus, type 2) (HCC)   CKD stage 4 due to type 2 diabetes mellitus ([place])   Hypertensive cardiomyopathy, with heart failure (HCC)   CAP (community acquired pneumonia)   Pleural effusion   HTN (hypertension)  Acute hypoxic respiratory failure requiring 2 lit/mi nasal cannula oxygen to keep sats greater than 90%. Probably secondary to a combination of pulmonary edema, from acute on chronic diastolic heart failure, worsening stage IV CKD , right pleural effusion, possible community-acquired pneumonia,  Patient underwent thoracentesis as outpatient on 04/16/2020. Transudate . Cytology showed no malignant cells. He was started on IV Rocephin and Zithromax for possible community-acquired pneumonia. Recommend to continue with IV Rocephin and Zithromax for 24 more hours and transition to oral antibiotics tomorrow.  We have weaned his oxygen down to 1 L today and plan to wean him off oxygen by tomorrow He was also started on IV lasix 120 mg BID for pulm edema, transition to oral torsemide today. CT of the chest without contrast shows pneumonia in the right middle lobe, right lower lobe infiltrate/consolidation/collapse with moderate right sided effusion without evidence of loculation. Recommend outpatient follow-up with Dr. Leonides Schanz with pulmonology in 2 to 4 weeks to evaluate for repeat imaging and resolution of the pneumonia Urine fore strep pneumo antigen is negative. Pro calcitonin is less than 0.10    Stage 4 CKD:  Baseline creatinine is between 3.5 and 4. Stable around 4.1   Hypertension: Blood pressure are optimal  Anemia of chronic disease probably from CRF.  Hemoglobin of 10.8 this am.    Type 2 DM;  CBG (last 3)  Recent Labs  04/19/20 2037 04/20/20 0736 04/20/20 1130  GLUCAP 215* 153* 133*   Resume SSI. Hemoglobin A1c is 7.3. No changes in medications at this time.    DVT prophylaxis: Heparin Code Status:  full code  family Communication: Family at  bedside  disposition:   Status is: Inpatient  Remains inpatient appropriate because:Ongoing diagnostic testing needed not appropriate for outpatient work up, IV treatments appropriate due to intensity of illness or inability to take PO and Inpatient level of care appropriate due to severity of illness   Dispo: The patient is from: Home              Anticipated d/c is to: Pending              Anticipated d/c date is: 3 days              Patient currently is not medically stable to d/c.       Consultants:   Nephrology   Procedures: Underwent ultrasound thoracentesis on 04/16/20   Antimicrobials: IV Rocephin and Zithromax for pneumonia   Subjective: No new complaints at this time  Objective: Vitals:   04/19/20 2035 04/19/20 2141 04/20/20 0457 04/20/20 0853  BP: (!) 148/74 136/76 131/78 124/64  Pulse: 70 66 66 64  Resp: 16 16 15 18   Temp: 97.7 F (36.5 C) 97.8 F (36.6 C) 97.9 F (36.6 C) 97.9 F (36.6 C)  TempSrc: Oral Oral Oral Oral  SpO2: 93% 97% 96% 96%  Weight:   90.7 kg   Height:        Intake/Output Summary (Last 24 hours) at 04/20/2020 1446 Last data filed at 04/20/2020 1400 Gross per 24 hour  Intake 1452.09 ml  Output 2025 ml  Net -572.91 ml   Filed Weights   04/17/20 1617 04/19/20 0326 04/20/20 0457  Weight: 87.3 kg 89.1 kg 90.7 kg    Examination:  General exam: Alert and comfortable on 1 L of nasal cannula oxygen Respiratory system: Air entry fair bilateral, no tachypnea. Cardiovascular system: S1-S2 heard, regular rate rhythm, no JVD, no pedal edema Gastrointestinal system: Abdomen is soft, nontender, bowel sounds normal Central nervous system: Alert and oriented, grossly nonfocal Extremities: No pedal edema or cyanosis Skin: No rashes seen Psychiatry: Mood is appropriate    Data Reviewed: I have personally reviewed following labs and imaging studies  CBC: Recent Labs  Lab 04/17/20 1453 04/18/20 0225 04/19/20 0139 04/20/20 0225   WBC 8.2 7.5 7.6 8.0  HGB 11.1* 10.8* 10.7* 11.3*  HCT 36.4* 35.0* 32.8* 36.8*  MCV 86.3 86.4 84.3 86.4  PLT 255 239 274 474    Basic Metabolic Panel: Recent Labs  Lab 04/17/20 1453 04/18/20 0225 04/19/20 0139 04/20/20 0225  NA 140 139 140 142  K 3.7 3.7 4.0 3.9  CL 95* 95* 95* 93*  CO2 28 32 32 33*  GLUCOSE 186* 162* 192* 160*  BUN 103* 106* 101* 98*  CREATININE 4.18* 4.14* 4.14* 3.92*  CALCIUM 7.8* 7.6* 7.8* 7.9*  PHOS  --   --   --  6.3*    GFR: Estimated Creatinine Clearance: 19.9 mL/min (A) (by C-G formula based on SCr of 3.92 mg/dL (H)).  Liver Function Tests: Recent Labs  Lab 04/17/20 2245 04/20/20 0225  AST 11*  --   ALT 12  --   ALKPHOS 84  --   BILITOT 0.8  --   PROT 7.3  --   ALBUMIN 3.0* 3.1*    CBG: Recent Labs  Lab 04/19/20 1216  04/19/20 1702 04/19/20 2037 04/20/20 0736 04/20/20 1130  GLUCAP 239* 108* 215* 153* 133*     Recent Results (from the past 240 hour(s))  SARS CORONAVIRUS 2 (TAT 6-24 HRS) Nasopharyngeal Nasopharyngeal Swab     Status: None   Collection Time: 04/14/20  1:58 PM   Specimen: Nasopharyngeal Swab  Result Value Ref Range Status   SARS Coronavirus 2 NEGATIVE NEGATIVE Final    Comment: (NOTE) SARS-CoV-2 target nucleic acids are NOT DETECTED.  The SARS-CoV-2 RNA is generally detectable in upper and lower respiratory specimens during the acute phase of infection. Negative results do not preclude SARS-CoV-2 infection, do not rule out co-infections with other pathogens, and should not be used as the sole basis for treatment or other patient management decisions. Negative results must be combined with clinical observations, patient history, and epidemiological information. The expected result is Negative.  Fact Sheet for Patients: SugarRoll.be  Fact Sheet for Healthcare Providers: https://www.woods-mathews.com/  This test is not yet approved or cleared by the [place]  FDA and  has been authorized for detection and/or diagnosis of SARS-CoV-2 by FDA under an Emergency Use Authorization (EUA). This EUA will remain  in effect (meaning this test can be used) for the duration of the COVID-19 declaration under Se ction 564(b)(1) of the Act, 21 U.S.C. section 360bbb-3(b)(1), unless the authorization is terminated or revoked sooner.  Performed at [facility], [place] [address], [place], [place] [address]   Resp Panel by RT-PCR (Flu A&B, Covid) Nasopharyngeal Swab     Status: None   Collection Time: 04/17/20  4:48 PM   Specimen: Nasopharyngeal Swab; Nasopharyngeal(NP) swabs in vial transport medium  Result Value Ref Range Status   SARS Coronavirus 2 by RT PCR NEGATIVE NEGATIVE Final    Comment: (NOTE) SARS-CoV-2 target nucleic acids are NOT DETECTED.  The SARS-CoV-2 RNA is generally detectable in upper respiratory specimens during the acute phase of infection. The lowest concentration of SARS-CoV-2 viral copies this assay can detect is 138 copies/mL. A negative result does not preclude SARS-Cov-2 infection and should not be used as the sole basis for treatment or other patient management decisions. A negative result may occur with  improper specimen collection/handling, submission of specimen other than nasopharyngeal swab, presence of viral mutation(s) within the areas targeted by this assay, and inadequate number of viral copies(<138 copies/mL). A negative result must be combined with clinical observations, patient history, and epidemiological information. The expected result is Negative.  Fact Sheet for Patients:  EntrepreneurPulse.com.au  Fact Sheet for Healthcare Providers:  IncredibleEmployment.be  This test is no t yet approved or cleared by the [place] FDA and  has been authorized for detection and/or diagnosis of SARS-CoV-2 by FDA under an Emergency Use Authorization (EUA). This EUA will remain   in effect (meaning this test can be used) for the duration of the COVID-19 declaration under Section 564(b)(1) of the Act, 21 U.S.C.section 360bbb-3(b)(1), unless the authorization is terminated  or revoked sooner.       Influenza A by PCR NEGATIVE NEGATIVE Final   Influenza B by PCR NEGATIVE NEGATIVE Final    Comment: (NOTE) The Xpert Xpress SARS-CoV-2/FLU/RSV plus assay is intended as an aid in the diagnosis of influenza from Nasopharyngeal swab specimens and should not be used as a sole basis for treatment. Nasal washings and aspirates are unacceptable for Xpert Xpress SARS-CoV-2/FLU/RSV testing.  Fact Sheet for Patients: EntrepreneurPulse.com.au  Fact Sheet for Healthcare Providers: IncredibleEmployment.be  This test is not yet approved  or cleared by the [place] and has been authorized for detection and/or diagnosis of SARS-CoV-2 by FDA under an Emergency Use Authorization (EUA). This EUA will remain in effect (meaning this test can be used) for the duration of the COVID-19 declaration under Section 564(b)(1) of the Act, 21 U.S.C. section 360bbb-3(b)(1), unless the authorization is terminated or revoked.  Performed at [facility], [place] [address], [place], [place] [address]          Radiology Studies: No results found.      Scheduled Meds: . amLODipine  10 mg Oral Daily  . aspirin EC  81 mg Oral Daily  . calcitRIOL  0.25 mcg Oral Daily  . calcium acetate  667 mg Oral TID WC  . carvedilol  12.5 mg Oral BID WC  . gabapentin  300 mg Oral BID  . heparin  5,000 Units Subcutaneous Q8H  . hydrALAZINE  50 mg Oral TID  . insulin aspart  0-6 Units Subcutaneous TID WC  . isosorbide mononitrate  60 mg Oral QPM  . melatonin  3 mg Oral QHS  . torsemide  40 mg Oral BID   Continuous Infusions: . sodium chloride Stopped (04/20/20 0205)  . azithromycin Stopped (04/19/20 2250)  . cefTRIAXone (ROCEPHIN)  IV Stopped  (04/19/20 2353)     LOS: 3 days        Hosie Poisson, MD [facility]   To contact the attending provider between 7A-7P or the covering provider during after hours 7P-7A, please log into the web site [email or web address] and access using universal [place] password for that web site. If you do not have the password, please call the hospital operator.  04/20/2020, 2:46 PM

## 2020-04-20 NOTE — TOC Initial Note (Signed)
Transition of Care [facility]) - Initial/Assessment Note    Patient Details  Name: Melvin Mcclure MRN: [id number] Date of Birth: July 10, 1950  [facility] CM/SW Contact:    Bethena Roys, RN Phone Number: 04/20/2020, 7:55 PM  Clinical Narrative:  Risk for readmission assessment completed. Prior to arrival patient was from home with spouse that is a Marine scientist. Pt states he was independent prior to arrival. He has durable medical equipment oxygen in the home from [place]. Patient discussed that he needs a new portable oxygen concentrator the [employer]ogen- Case Manager discussed that he may need to call the company and the Respiratory therapist can reassess to see if he is appropriate for [employer]. Wife asked that patient have physical therapy evaluate the patient prior to transition home since he has been in the bed while hospitalized. PT orders placed in Epic. Case Manager discussed home needs and patient states wife is his support and feels like he will not need [place] at this time. Patient has a primary care provider and he makes it to appointments without any issues. Patient gets medications without any problems and takes them as prescribed. Case Manager will continue to follow for additional transition of care needs.                Expected Discharge Plan: Home/Self Care Barriers to Discharge: Continued Medical Work up   Patient Goals and CMS Choice Patient states their goals for this hospitalization and ongoing recovery are:: to return home once stable   Choice offered to / list presented to : NA  Expected Discharge Plan and Services Expected Discharge Plan: Home/Self Care In-house Referral: NA Discharge Planning Services: CM Consult Post Acute Care Choice: NA Living arrangements for the past 2 months: Single Family Home                           HH Arranged: NA          Prior Living Arrangements/Services Living arrangements for the past 2 months: Single  Family Home Lives with:: Spouse Patient language and need for interpreter reviewed:: Yes Do you feel safe going back to the place where you live?: Yes      Need for Family Participation in Patient Care: Yes (Comment) Care giver support system in place?: Yes (comment) Current home services: DME (Has 02 via [place]) Criminal Activity/Legal Involvement Pertinent to Current Situation/Hospitalization: No - Comment as needed  Activities of Daily Living Home Assistive Devices/Equipment: None ADL Screening (condition at time of admission) Patient's cognitive ability adequate to safely complete daily activities?: Yes Is the patient deaf or have difficulty hearing?: No Does the patient have difficulty seeing, even when wearing glasses/contacts?: No Does the patient have difficulty concentrating, remembering, or making decisions?: No Patient able to express need for assistance with ADLs?: Yes Does the patient have difficulty dressing or bathing?: No Independently performs ADLs?: Yes (appropriate for developmental age) Does the patient have difficulty walking or climbing stairs?: Yes Weakness of Legs: Both Weakness of Arms/Hands: None  Permission Sought/Granted Permission sought to share information with : Family Estate agent                Emotional Assessment Appearance:: Appears stated age Attitude/Demeanor/Rapport: Engaged Affect (typically observed): Appropriate Orientation: : Oriented to Place,Oriented to Situation,Oriented to  Time,Oriented to Self Alcohol / Substance Use: Not Applicable Psych Involvement: No (comment)  Admission diagnosis:  Shortness of breath [R06.02] Pleural effusion [J90]  Hypoxia [R09.02] Acute on chronic respiratory failure with hypoxia (HCC) [J96.21] Patient Active Problem List   Diagnosis Date Noted  . Acute on chronic respiratory failure with hypoxia ([place]) 04/17/2020  . Pleural effusion 01/17/2020  . HTN  (hypertension) 01/17/2020  . Acute exacerbation of CHF (congestive heart failure) ([place]) 12/23/2019  . Hypocalcemia 12/23/2019  . Anemia of chronic disease 12/23/2019  . Vertigo 12/23/2019  . Acute respiratory failure with hypoxia ([place]) 12/23/2019  . CAP (community acquired pneumonia) 10/28/2019  . Hypertensive cardiomyopathy, with heart failure ([place]) 06/15/2018  . Acute on chronic diastolic CHF (congestive heart failure) ([place]) 05/28/2018  . DM2 (diabetes mellitus, type 2) ([place]) 05/28/2018  . CKD stage 4 due to type 2 diabetes mellitus ([place]) 05/28/2018  . CHF (congestive heart failure) ([place]) 05/28/2018   PCP:  Nolene Ebbs, MD Pharmacy:   [facility] [address] ([address]), [place] - [address] [place] ([place]) [place] [address] Phone: [phone] Fax: [phone]     Readmission Risk Interventions Readmission Risk Prevention Plan 04/20/2020  Transportation Screening Complete  Medication Review ([place]) Complete  PCP or Specialist appointment within 3-5 days of discharge Complete  HRI or [place] Complete  SW Recovery Care/Counseling Consult Complete  [place] Not Applicable  Some recent data might be hidden

## 2020-04-20 NOTE — Progress Notes (Signed)
Patient ID: Melvin Mcclure, male   DOB: 1950-07-29, 69 y.o.   MRN: [id number] [name] Progress Note   Assessment/ Plan:   1.  Acute kidney injury on chronic kidney disease stage IV: Appears hemodynamically mediated acute kidney injury in the setting of CHF exacerbation/reexpansion pulmonary edema following thoracentesis.  Again net negative over the past 24 hours however, weight paradoxically up.  Renal function slightly better with downtrending BUN and creatinine.  I will switch him from intravenous furosemide to oral torsemide with the goal to possibly discharge home tomorrow if stable from a respiratory standpoint.  Discussed with Melvin Mcclure will likely need scheduled metolazone twice a week for volume management.  (Given improvement of his respiratory status-which likely was compounded by reexpansion pulmonary edema, will defer this for now). 2.  Acute hypoxic respiratory failure: This appears to be consistent with reexpansion pulmonary edema/acute exacerbation of diastolic heart failure and recurrent pleural right pleural effusion along with pneumonia.  Improving with volume unloading and treatment of pneumonia. 3.  Hypertension: Blood pressures currently within acceptable range his current medication/ongoing diuresis. 4.  Anemia of chronic kidney disease: Without overt loss, will continue to monitor for indications for ESA. 5.  Secondary hyperparathyroidism: Elevated phosphorus noted-restart calcium acetate and continue calcitriol for PTH control. 6.  Insomnia: Responded well to melatonin last night. Subjective:   Reports that he is feeling better and slept soundly overnight.  Reports breathing status is better.   Objective:   BP 124/64 (BP Location: Right Arm)   Pulse 64   Temp 97.9 F (36.6 C) (Oral)   Resp 18   Ht 5\' 10"  (1.778 m)   Wt 90.7 kg   SpO2 96%   BMI 28.69 kg/m   Intake/Output Summary (Last 24 hours) at 04/20/2020 0959 Last data filed at 04/20/2020  0324 Gross per 24 hour  Intake 1252.09 ml  Output 1875 ml  Net -622.91 ml   Weight change: 1.614 kg  Physical Exam: Gen: Sleeping comfortably in bed, easy to awaken and engage in conversation CVS: Pulse regular rhythm, normal rate, S1 and S2 normal Resp: Fine rales right base otherwise clear to auscultation.  No wheeze/rhonchi Abd: Soft, slightly distended, nontender.  Bowel sounds normal Ext: No lower extremity edema  Imaging: CT CHEST WO CONTRAST  Result Date: 04/18/2020 CLINICAL DATA:  Pneumonia.  Question abscess or empyema. EXAM: CT CHEST WITHOUT CONTRAST TECHNIQUE: Multidetector CT imaging of the chest was performed following the standard protocol without IV contrast. COMPARISON:  Chest radiography yesterday.  Chest CT 11/01/2019 FINDINGS: Cardiovascular: Mild cardiomegaly. No pericardial effusion. Coronary artery calcification is present. Aortic atherosclerotic calcification is present. Mediastinum/Nodes: No mediastinal mass. No paratracheal lymphadenopathy. Adenopathy is likely present in the right hilar region, presumed reactive to the right lower lobe inflammatory disease. Mass lesion not excluded based on this exam. Lungs/Pleura: Left lung shows dependent atelectasis or infiltrate in the left lower lobe. Left upper lobe is clear. Right chest shows a moderate size effusion layering dependently. No evidence of loculation. There is infiltrate/consolidation/collapse affecting the right lower lobe. Some dependent infiltrate/volume loss present in the right upper lobe. Moderate hazy pneumonia in the right middle lobe. Upper Abdomen: Negative Musculoskeletal: Ordinary thoracic degenerative changes. IMPRESSION: 1. Right lower lobe infiltrate/consolidation/collapse. Some dependent infiltrate/volume loss in the right upper lobe. Moderate hazy pneumonia in the right middle lobe. Moderate size right effusion layering dependently without evidence of loculation. Findings may be due 2 pneumonia, but  the possibility of tumor in the right inferior hilar  region is not excluded by this exam. 2. Dependent atelectasis or infiltrate in the left lower lobe. 3. Aortic atherosclerosis. Coronary artery calcification. Aortic Atherosclerosis (ICD10-I70.0). Electronically Signed   By: Melvin Mcclure M.D.   On: 04/18/2020 12:51    Labs: BMET Recent Labs  Lab 04/17/20 1453 04/18/20 0225 04/19/20 0139 04/20/20 0225  NA 140 139 140 142  K 3.7 3.7 4.0 3.9  CL 95* 95* 95* 93*  CO2 28 32 32 33*  GLUCOSE 186* 162* 192* 160*  BUN 103* 106* 101* 98*  CREATININE 4.18* 4.14* 4.14* 3.92*  CALCIUM 7.8* 7.6* 7.8* 7.9*  PHOS  --   --   --  6.3*   CBC Recent Labs  Lab 04/17/20 1453 04/18/20 0225 04/19/20 0139 04/20/20 0225  WBC 8.2 7.5 7.6 8.0  HGB 11.1* 10.8* 10.7* 11.3*  HCT 36.4* 35.0* 32.8* 36.8*  MCV 86.3 86.4 84.3 86.4  PLT 255 239 274 266    Medications:    . amLODipine  10 mg Oral Daily  . aspirin EC  81 mg Oral Daily  . calcitRIOL  0.25 mcg Oral Daily  . calcium carbonate  2 tablet Oral Daily  . carvedilol  12.5 mg Oral BID WC  . furosemide  40 mg Intravenous Q12H  . gabapentin  300 mg Oral BID  . heparin  5,000 Units Subcutaneous Q8H  . hydrALAZINE  50 mg Oral TID  . insulin aspart  0-6 Units Subcutaneous TID WC  . isosorbide mononitrate  60 mg Oral QPM  . melatonin  3 mg Oral QHS   Melvin Shiley, MD 04/20/2020, 9:59 AM

## 2020-04-21 ENCOUNTER — Inpatient Hospital Stay (HOSPITAL_COMMUNITY): Payer: Medicare Other

## 2020-04-21 LAB — RENAL FUNCTION PANEL
Albumin: 3.3 g/dL — ABNORMAL LOW (ref 3.5–5.0)
Anion gap: 15 (ref 5–15)
BUN: 100 mg/dL — ABNORMAL HIGH (ref 8–23)
CO2: 34 mmol/L — ABNORMAL HIGH (ref 22–32)
Calcium: 8.3 mg/dL — ABNORMAL LOW (ref 8.9–10.3)
Chloride: 91 mmol/L — ABNORMAL LOW (ref 98–111)
Creatinine, Ser: 3.79 mg/dL — ABNORMAL HIGH (ref 0.61–1.24)
GFR, Estimated: 16 mL/min — ABNORMAL LOW (ref >60)
Glucose, Bld: 125 mg/dL — ABNORMAL HIGH (ref 70–99)
Phosphorus: 6.2 mg/dL — ABNORMAL HIGH (ref 2.5–4.6)
Potassium: 4.1 mmol/L (ref 3.5–5.1)
Sodium: 140 mmol/L (ref 135–145)

## 2020-04-21 LAB — CBC
HCT: 34.7 % — ABNORMAL LOW (ref 39.0–52.0)
Hemoglobin: 10.7 g/dL — ABNORMAL LOW (ref 13.0–17.0)
MCH: 26.7 pg (ref 26.0–34.0)
MCHC: 30.8 g/dL (ref 30.0–36.0)
MCV: 86.5 fL (ref 80.0–100.0)
Platelets: 252 10*3/uL (ref 150–400)
RBC: 4.01 MIL/uL — ABNORMAL LOW (ref 4.22–5.81)
RDW: 15.1 % (ref 11.5–15.5)
WBC: 8.1 10*3/uL (ref 4.0–10.5)
nRBC: 0 % (ref 0.0–0.2)

## 2020-04-21 LAB — GLUCOSE, CAPILLARY
Glucose-Capillary: 121 mg/dL — ABNORMAL HIGH (ref 70–99)
Glucose-Capillary: 175 mg/dL — ABNORMAL HIGH (ref 70–99)

## 2020-04-21 MED ORDER — AMOXICILLIN-POT CLAVULANATE 500-125 MG PO TABS: 1.0000 | ORAL_TABLET | Freq: Every day | ORAL | 0 refills | Status: DC

## 2020-04-21 MED ORDER — BLOOD GLUCOSE MONITOR KIT: PACK | 0 refills | Status: AC

## 2020-04-21 MED ORDER — CALCIUM ACETATE (PHOS BINDER) 667 MG PO CAPS: 667.0000 mg | ORAL_CAPSULE | Freq: Three times a day (TID) | ORAL | 1 refills | Status: DC

## 2020-04-21 NOTE — TOC Transition Note (Addendum)
Transition of Care [facility]) - CM/SW Discharge Note   Patient Details  Name: Melvin Mcclure MRN: [id number] Date of Birth: [birth year]  Transition of Care [facility]) CM/SW Contact:  Zenon Mayo, RN Phone Number: 04/21/2020, 3:41 PM   Clinical Narrative:    Per previous NCM note-Risk for readmission assessment completed. Prior to arrival patient was from home with spouse that is a Marine scientist. Pt states he was independent prior to arrival. He has durable medical equipment oxygen in the home from [employer]. Patient discussed that he needs a new portable oxygen concentrator the [employer]- Case Manager discussed that he may need to call the company and the Respiratory therapist can reassess to see if he is appropriate for [employer]. Wife asked that patient have physical therapy evaluate the patient prior to transition home since he has been in the bed while hospitalized. PT orders placed in Epic. Case Manager discussed home needs and patient states wife is his support and feels like he will not need [place] at this time. Patient has a primary care provider and he makes it to appointments without any issues. Patient gets medications without any problems and takes them as prescribed. Case Manager will continue to follow for additional transition of care needs.     12/18- per Staff RN , patient was wanting a glucometer , NCM informed RN to get script from MD for the glucometer for patient to get thru his insurance, per the diabetic coordinator.              Final next level of care: Home/Self Care Barriers to Discharge: No Barriers Identified   Patient Goals and CMS Choice Patient states their goals for this hospitalization and ongoing recovery are:: to return home once stable   Choice offered to / list presented to : NA  Discharge Placement                       Discharge Plan and Services In-house Referral: NA Discharge Planning Services: CM Consult Post Acute Care Choice: NA                     HH Arranged: NA          Social Determinants of Health (SDOH) Interventions     Readmission Risk Interventions Readmission Risk Prevention Plan 04/20/2020  Transportation Screening Complete  Medication Review Press photographer) Complete  PCP or Specialist appointment within 3-5 days of discharge Complete  HRI or [place] Complete  SW Recovery Care/Counseling Consult Complete  [place] Not Applicable  Some recent data might be hidden

## 2020-04-21 NOTE — Evaluation (Signed)
Physical Therapy Evaluation Patient Details Name: Melvin Mcclure MRN: [id number] DOB: May 08, 1950 Today's Date: 04/21/2020   History of Present Illness  69 y.o. male with medical history significant for CKD IV, HFpEF, type 2 diabetes mellitus, and hypertension, presented to the emergency department for evaluation of increased shortness of breath and cough.  Patient underwent right thoracentesis 12/13 with 2 L of fluid removed, has continued increased cough and shortness of breath.  Clinical Impression  Patients Sao2 remained constant on 2l of nco2. He ambulated 63' with Sao2 never dropping below 94%. He did require guarding for stability but had no loss of balance. Patient will likely not require follow up PT at home. He would benefit from further skilled therapy while admitted.     Follow Up Recommendations Supervision - Intermittent;No PT follow up    Equipment Recommendations  None recommended by PT    Recommendations for Other Services       Precautions / Restrictions Precautions Precautions: Fall Restrictions Weight Bearing Restrictions: No Other Position/Activity Restrictions: O2 sats      Mobility  Bed Mobility Overal bed mobility: Modified Independent             General bed mobility comments: Patient found sitting edge of the bed. He went back to prone after treatment without assist    Transfers Overall transfer level: Needs assistance Equipment used: None Transfers: Sit to/from Stand Sit to Stand: Supervision Stand pivot transfers: Min guard       General transfer comment: supervision for balance, line, and leads. On 2L of Nco2  Ambulation/Gait Ambulation/Gait assistance: Min guard Gait Distance (Feet): 75 Feet Assistive device: None Gait Pattern/deviations: Staggering left;Staggering right Gait velocity: decreased Gait velocity interpretation: <1.31 ft/sec, indicative of household ambulator General Gait Details: Patient staggered to the left and  right a few times while ambualting. he did not require therapy assist to correct. He was on 2L of Nco2 while ambualting. Sao2 remained at 95%. After ambaultion his Sao2 was up to 96%. During ambualtion at 1 point it dropeped to 94% othersie it stayed consitent.  Stairs            Wheelchair Mobility    Modified Rankin (Stroke Patients Only)       Balance Overall balance assessment: Needs assistance Sitting-balance support: No upper extremity supported Sitting balance-Leahy Scale: Fair     Standing balance support: No upper extremity supported Standing balance-Leahy Scale: Poor Standing balance comment: 3 LOB noted.  Able to recover without assist.                             Pertinent Vitals/Pain Pain Assessment: No/denies pain    Home Living Family/patient expects to be discharged to:: Private residence Living Arrangements: Spouse/significant other Available Help at Discharge: Available 24 hours/day Type of Home: House Home Access: Level entry     Home Layout: Two level Home Equipment: None Additional Comments: level entry is from the back of the home.    Prior Function Level of Independence: Independent         Comments: Independent with ADLs/IADLs, drives.     Hand Dominance   Dominant Hand: Right    Extremity/Trunk Assessment   Upper Extremity Assessment Upper Extremity Assessment: Defer to OT evaluation    Lower Extremity Assessment Lower Extremity Assessment: Overall WFL for tasks assessed    Cervical / Trunk Assessment Cervical / Trunk Assessment: Kyphotic  Communication   Communication: HOH  Cognition Arousal/Alertness:  Awake/alert Behavior During Therapy: Flat affect Overall Cognitive Status: Within Functional Limits for tasks assessed                                 General Comments: Patient found sitting edge of the bed. Cognition appears improved form previous encournters      General Comments General  comments (skin integrity, edema, etc.): 2L odf NCO2    Exercises     Assessment/Plan    PT Assessment Patient needs continued PT services  PT Problem List Decreased strength;Decreased range of motion;Decreased activity tolerance;Decreased mobility;Decreased balance;Decreased safety awareness       PT Treatment Interventions DME instruction;Gait training;Stair training;Functional mobility training;Therapeutic activities;Therapeutic exercise;Patient/family education    PT Goals (Current goals can be found in the Care Plan section)  Acute Rehab PT Goals Patient Stated Goal: Hoping to go home without needing oxygen PT Goal Formulation: With patient Time For Goal Achievement:  (w+1) Potential to Achieve Goals: Good    Frequency Min 3X/week   Barriers to discharge        Co-evaluation               AM-PAC PT "6 Clicks" Mobility  Outcome Measure Help needed turning from your back to your side while in a flat bed without using bedrails?: None Help needed moving from lying on your back to sitting on the side of a flat bed without using bedrails?: None Help needed moving to and from a bed to a chair (including a wheelchair)?: A Little Help needed standing up from a chair using your arms (e.g., wheelchair or bedside chair)?: A Little Help needed to walk in hospital room?: A Little Help needed climbing 3-5 steps with a railing? : A Little 6 Click Score: 20    End of Session Equipment Utilized During Treatment: Gait belt Activity Tolerance: Patient tolerated treatment well Patient left: in bed;with call bell/phone within reach;with nursing/sitter in room Nurse Communication: Mobility status PT Visit Diagnosis: Unsteadiness on feet (R26.81);Other abnormalities of gait and mobility (R26.89)    Time: 1355-1415 PT Time Calculation (min) (ACUTE ONLY): 20 min   Charges:   PT Evaluation $PT Eval Moderate Complexity: 1 Mod           Carney Living PT DPT  04/21/2020,  3:25 PM

## 2020-04-21 NOTE — Plan of Care (Signed)
Completed prior to discharge. Education given to both patient and wife [name]

## 2020-04-21 NOTE — Evaluation (Signed)
Occupational Therapy Evaluation Patient Details Name: Melvin Mcclure MRN: [id number] DOB: [birth year] Today's Date: 04/21/2020    History of Present Illness 69 y.o. male with medical history significant for CKD IV, HFpEF, type 2 diabetes mellitus, and hypertension, presented to the emergency department for evaluation of increased shortness of breath and cough.  Patient underwent right thoracentesis 12/13 with 2 L of fluid removed, has continued increased cough and shortness of breath.   Clinical Impression   Patient admitted for the above diagnosis.  PTA he did have home O2, but did not use it much.  He states he was independent with ADL, mobility, home management, meds and continues to drive.  His wife is a Marine scientist, and is able to assist as needed.  Deficits are listed below.  Currently he needs up to supervision and setup for bed mobility, upper and lower body ADL, and up to [facility] for functional mobility with loss of balance noted.  O2 sats on RA upon entering 80%.  O2 did not improve much with pursed lip breathing, up to 84%.  1 L O2 via [place] placed and patient up to 94%.  O2 left on.  OT will continue to follow in the acute setting, but he is hoping to go home today.      Follow Up Recommendations  No OT follow up    Equipment Recommendations  Tub/shower seat    Recommendations for Other Services       Precautions / Restrictions Precautions Precautions: Fall Restrictions Weight Bearing Restrictions: No Other Position/Activity Restrictions: O2 sats      Mobility Bed Mobility Overal bed mobility: Modified Independent                  Transfers Overall transfer level: Needs assistance   Transfers: Sit to/from Stand;Stand Pivot Transfers Sit to Stand: Supervision Stand pivot transfers: Min guard       General transfer comment: LOB noted    Balance Overall balance assessment: Needs assistance Sitting-balance support: No upper extremity supported Sitting  balance-Leahy Scale: Fair     Standing balance support: No upper extremity supported Standing balance-Leahy Scale: Poor Standing balance comment: 3 LOB noted.  Able to recover without assist.                           ADL either performed or assessed with clinical judgement   ADL Overall ADL's : Needs assistance/impaired Eating/Feeding: Independent;Sitting   Grooming: Wash/dry hands;Wash/dry face;Standing;Supervision/safety           Upper Body Dressing : Set up;Sitting   Lower Body Dressing: Supervision/safety;Sit to/from stand   Toilet Transfer: Nature conservation officer;Ambulation   Toileting- Clothing Manipulation and Hygiene: Supervision/safety       Functional mobility during ADLs: Min guard General ADL Comments: No AD     Vision Patient Visual Report: No change from baseline       Perception     Praxis      Pertinent Vitals/Pain Pain Assessment: No/denies pain     Hand Dominance Right   Extremity/Trunk Assessment Upper Extremity Assessment Upper Extremity Assessment: Generalized weakness   Lower Extremity Assessment Lower Extremity Assessment: Defer to PT evaluation   Cervical / Trunk Assessment Cervical / Trunk Assessment: Kyphotic   Communication Communication Communication: HOH   Cognition Arousal/Alertness: Lethargic Behavior During Therapy: Flat affect Overall Cognitive Status: Within Functional Limits for tasks assessed  General Comments: Patient states he has not slept well and is not himself.   General Comments       Exercises     Shoulder Instructions      Home Living Family/patient expects to be discharged to:: Private residence Living Arrangements: Spouse/significant other Available Help at Discharge: Available 24 hours/day Type of Home: House Home Access: Level entry     Home Layout: Two level Alternate Level Stairs-Number of Steps: 15 with a landing Alternate  Level Stairs-Rails: Right Bathroom Shower/Tub: Teacher, early years/pre: Standard     Home Equipment: None   Additional Comments: level entry is from the back of the home.      Prior Functioning/Environment Level of Independence: Independent        Comments: Independent with ADLs/IADLs, drives.        OT Problem List: Decreased strength;Decreased activity tolerance;Impaired balance (sitting and/or standing)      OT Treatment/Interventions: Self-care/ADL training;Therapeutic exercise;Balance training;Therapeutic activities    OT Goals(Current goals can be found in the care plan section) Acute Rehab OT Goals Patient Stated Goal: Hoping to go home without needing oxygen OT Goal Formulation: With patient Time For Goal Achievement: 05/05/20 Potential to Achieve Goals: Good  OT Frequency: Min 2X/week   Barriers to D/C:    none noted       Co-evaluation              AM-PAC OT "6 Clicks" Daily Activity     Outcome Measure Help from another person eating meals?: None Help from another person taking care of personal grooming?: A Little Help from another person toileting, which includes using toliet, bedpan, or urinal?: A Little Help from another person bathing (including washing, rinsing, drying)?: A Little Help from another person to put on and taking off regular upper body clothing?: A Little Help from another person to put on and taking off regular lower body clothing?: A Little 6 Click Score: 19   End of Session Equipment Utilized During Treatment: Gait belt Nurse Communication: Mobility status  Activity Tolerance: Patient limited by fatigue Patient left: in bed;with call bell/phone within reach;with family/visitor present  OT Visit Diagnosis: Unsteadiness on feet (R26.81)                Time: 4128-2081 OT Time Calculation (min): 22 min Charges:  OT General Charges $OT Visit: 1 Visit OT Evaluation $OT Eval Moderate Complexity: 1  Mod  04/21/2020  Rich, OTR/L  Acute Rehabilitation Services  Office:  [phone]   Metta Clines 04/21/2020, 11:57 AM

## 2020-04-21 NOTE — Progress Notes (Signed)
SATURATION QUALIFICATIONS: (This note is used to comply with regulatory documentation for home oxygen)  Patient Saturations on Room Air at Rest = 89-90%  Patient Saturations on Room Air while Ambulating = 84-85%  Patient Saturations on 2 Liters of oxygen while Ambulating = 96-97%  Please briefly explain why patient needs home oxygen: patient has home oxygen as needed. Documentation for reevaluation only.

## 2020-04-21 NOTE — Progress Notes (Signed)
Patient ID: Melvin Mcclure, male   DOB: [birth year], 69 y.o.   MRN: [id number] [facility] Progress Note   Assessment/ Plan:   1.  Acute kidney injury on chronic kidney disease stage IV: Likely hemodynamically mediated in the setting of CHF exacerbation/reexpansion pulmonary edema following thoracentesis.  Continues to respond well to diuretics following transition to oral torsemide yesterday evening; remains net negative from a fluid standpoint and now weaned off of oxygen.  Would recommend discharging him home on his usual outpatient dose of torsemide 40 mg twice daily with rescue metolazone use as previously directed by Dr. Royce Macadamia.  He is stable enough to discharge home from a renal standpoint. 2.  Acute hypoxic respiratory failure: This appears to be consistent with reexpansion pulmonary edema/acute exacerbation of diastolic heart failure and recurrent pleural right pleural effusion along with pneumonia.  Clinically improved status post diuresis and ongoing antibiotic therapy for pneumonia. 3.  Hypertension: Blood pressures currently within acceptable range his current medication/ongoing diuresis. 4.  Anemia of chronic kidney disease: Without overt loss, will continue to monitor for indications for ESA. 5.  Secondary hyperparathyroidism: Elevated phosphorus noted-restart calcium acetate and continue calcitriol for PTH control.  Subjective:   Reports continued improvement with his breathing and slept well overnight; looking forward to ambulate this morning to verify that he is stable enough to discharge.   Objective:   BP (!) 145/78 (BP Location: Right Arm)   Pulse 66   Temp 98 F (36.7 C) (Oral)   Resp 18   Ht 5\' 10"  (1.778 m)   Wt 90.2 kg   SpO2 97%   BMI 28.52 kg/m   Intake/Output Summary (Last 24 hours) at 04/21/2020 1019 Last data filed at 04/21/2020 0641 Gross per 24 hour  Intake 687.13 ml  Output 1800 ml  Net -1112.87 ml   Weight change: -0.525 kg  Physical  Exam: Gen: Resting comfortably in bed, awake and alert CVS: Pulse regular rhythm, normal rate, S1 and S2 normal Resp: Coarse breath sounds right base but without any rhonchi. Abd: Soft, slightly distended, nontender.  Bowel sounds normal Ext: No lower extremity edema  Imaging: US Abdomen Limited  Result Date: 04/20/2020 CLINICAL DATA:  Evaluation for ascites. EXAM: ULTRASOUND ABDOMEN LIMITED of all 4 abdominal quadrants COMPARISON:  None. FINDINGS: No free intraperitoneal fluid noted within the visualized 4 abdominal quadrants. IMPRESSION: No abdominal ascites. Electronically Signed   By: Iven Finn M.D.   On: 04/20/2020 18:21    Labs: BMET Recent Labs  Lab 04/17/20 1453 04/18/20 0225 04/19/20 0139 04/20/20 0225 04/21/20 0811  NA 140 139 140 142 140  K 3.7 3.7 4.0 3.9 4.1  CL 95* 95* 95* 93* 91*  CO2 28 32 32 33* 34*  GLUCOSE 186* 162* 192* 160* 125*  BUN 103* 106* 101* 98* 100*  CREATININE 4.18* 4.14* 4.14* 3.92* 3.79*  CALCIUM 7.8* 7.6* 7.8* 7.9* 8.3*  PHOS  --   --   --  6.3* 6.2*   CBC Recent Labs  Lab 04/18/20 0225 04/19/20 0139 04/20/20 0225 04/21/20 0207  WBC 7.5 7.6 8.0 8.1  HGB 10.8* 10.7* 11.3* 10.7*  HCT 35.0* 32.8* 36.8* 34.7*  MCV 86.4 84.3 86.4 86.5  PLT 239 274 266 252    Medications:    . amLODipine  10 mg Oral Daily  . aspirin EC  81 mg Oral Daily  . calcitRIOL  0.25 mcg Oral Daily  . calcium acetate  667 mg Oral TID WC  . carvedilol  12.5 mg Oral BID WC  . gabapentin  300 mg Oral BID  . heparin  5,000 Units Subcutaneous Q8H  . hydrALAZINE  50 mg Oral TID  . insulin aspart  0-6 Units Subcutaneous TID WC  . isosorbide mononitrate  60 mg Oral QPM  . melatonin  3 mg Oral QHS  . senna-docusate  2 tablet Oral BID  . torsemide  40 mg Oral BID   Elmarie Shiley, MD 04/21/2020, 10:19 AM

## 2020-04-21 NOTE — Discharge Summary (Signed)
Physician Discharge Summary  Melvin Mcclure WUJ:[id number] DOB: 1951-01-27 DOA: 04/17/2020  PCP: Nolene Ebbs, MD  Admit date: 04/17/2020 Discharge date: 04/21/2020  Admitted From: Home.  Disposition: Home.  Recommendations for Outpatient Follow-up:  1. Follow up with PCP in 1-2 weeks 2. Please obtain BMP/CBC in one week Please follow up with Dr Melvyn Novas in 2 to 4 weeks, and repeat imaging of the chest with CT to evaluate for resolution of the pneumonia and to rule out lung mass.   Discharge Condition:guarded.  CODE STATUS: FULL CODE.  Diet recommendation: renal diet.   Brief/Interim Summary: 69 year old gentleman prior history of stage IV CKD, heart failure with preserved EF, type 2 diabetes mellitus and hypertension presents with worsening shortness of breath and productive cough.  Patient underwent right thoracentesis on 04/17/2019 2 L of fluid was removed despite that patient continues to have worsening shortness of breath and presented to the ED.  On arrival to ED he was hypoxic and tachypneic.  Chest x-ray showed notable for vascular congestion and mild pulmonary edema, hazy opacity throughout the right thorax suspicious for layering pleural effusion, increased density at the right base, suspicious for pneumonia versus atelectasis.  Patient was admitted to [facility] for acute respiratory failure with hypoxia requiring up to 2 L of nasal cannula oxygen.  Nephrology consulted, and he was started on high dose diuretics. CT chest with out contrast showed Right lower lobe infiltrate/consolidation/collapse. Some dependent infiltrate/volume loss in the right upper lobe. Moderate hazy pneumonia in the right middle lobe. Moderate size right effusion layering dependently without evidence of loculation. Findings may be due 2 pneumonia, but the possibility of tumor in the right inferior hilar region is not excluded by this exam. Recommend outpatient follow up with Dr Melvyn Novas with pulmonology in 2 to 4 weeks to  evaluate for resolution of the pneumonia and evaluation of tumor.    Discharge Diagnoses:  Principal Problem:   Acute on chronic respiratory failure with hypoxia (HCC) Active Problems:   Acute on chronic diastolic CHF (congestive heart failure) (HCC)   DM2 (diabetes mellitus, type 2) (HCC)   CKD stage 4 due to type 2 diabetes mellitus ([place])   Hypertensive cardiomyopathy, with heart failure ([place])   CAP (community acquired pneumonia)   Pleural effusion   HTN (hypertension)   Acute hypoxic respiratory failure requiring 2 lit/mi nasal cannula oxygen to keep sats greater than 90%. Probably secondary to a combination of pulmonary edema, from acute on chronic diastolic heart failure, worsening stage IV CKD , right pleural effusion, possible community-acquired pneumonia,  Patient underwent thoracentesis as outpatient on 04/16/2020. Transudate . Cytology showed no malignant cells. He was started on IV Rocephin and Zithromax for possible community-acquired pneumonia. Transitioned to oral antibiotics on discharge.  We have weaned his oxygen down to 1 L today and plan to wean him off oxygen by tomorrow He was also started on IV lasix 120 mg BID for pulm edema, transition to oral torsemide today. CT of the chest without contrast shows pneumonia in the right middle lobe, right lower lobe infiltrate/consolidation/collapse with moderate right sided effusion without evidence of loculation. Recommend outpatient follow-up with Dr. Melvyn Novas with pulmonology in 2 to 4 weeks to evaluate for repeat imaging and resolution of the pneumonia Urine fore strep pneumo antigen is negative. Pro calcitonin is less than 0.10    Stage 4 CKD:  Baseline creatinine is between 3.5 and 4. Stable around 4.1   Hypertension: Blood pressure are optimal  Anemia of chronic disease probably from  CRF.  Hemoglobin of 10.8 this am.    Type 2 DM;  resume home meds on discharge.  Resume SSI. Hemoglobin A1c is 7.3. No  changes in medications at this time.    Discharge Instructions  Discharge Instructions    Diet - low sodium heart healthy   Complete by: As directed    Discharge instructions   Complete by: As directed    Please follow up with pulmonology and get repeat imaging in 4 to 6 weeks to evaluate for resolution of the effusion, consolidation.  Please follow up with PCP in one week.     Allergies as of 04/21/2020   No Known Allergies     Medication List    STOP taking these medications   calcium carbonate 500 MG chewable tablet Commonly known as: TUMS - dosed in mg elemental calcium     TAKE these medications   amLODipine 10 MG tablet Commonly known as: NORVASC Take 10 mg by mouth daily.   amoxicillin-clavulanate 500-125 MG tablet Commonly known as: Augmentin Take 1 tablet (500 mg total) by mouth daily.   aspirin 81 MG tablet Take 81 mg by mouth daily.   calcitRIOL 0.25 MCG capsule Commonly known as: ROCALTROL Take 0.25 mcg by mouth daily. Pt takes 1 tablet daily   calcium acetate 667 MG capsule Commonly known as: PHOSLO Take 1 capsule (667 mg total) by mouth 3 (three) times daily with meals.   carvedilol 12.5 MG tablet Commonly known as: COREG Take 1 tablet (12.5 mg total) by mouth 2 (two) times daily with a meal.   diclofenac Sodium 1 % Gel Commonly known as: VOLTAREN Apply 2 g topically 4 (four) times daily. What changed:   when to take this  reasons to take this   ferrous sulfate 325 (65 FE) MG tablet Take 325 mg by mouth daily with breakfast.   gabapentin 300 MG capsule Commonly known as: NEURONTIN Take 1 capsule (300 mg total) by mouth 2 (two) times daily.   hydrALAZINE 50 MG tablet Commonly known as: APRESOLINE Take 1 tablet (50 mg total) by mouth every 8 (eight) hours. What changed:   when to take this  additional instructions   hydrOXYzine 25 MG tablet Commonly known as: ATARAX/VISTARIL Take 25 mg by mouth 2 (two) times daily as needed for  anxiety.   isosorbide mononitrate 60 MG 24 hr tablet Commonly known as: IMDUR Take 1 tablet (60 mg total) by mouth every evening.   meclizine 25 MG tablet Commonly known as: ANTIVERT Take 1 tablet (25 mg total) by mouth 3 (three) times daily.   metolazone 5 MG tablet Commonly known as: ZAROXOLYN Take 1 tablet (5 mg total) by mouth as needed (Twice weekly as needed for shortness of breath, swelling, weight gain).   senna-docusate 8.6-50 MG tablet Commonly known as: Senokot-S Take 1 tablet by mouth 2 (two) times daily as needed for mild constipation.   torsemide 20 MG tablet Commonly known as: DEMADEX Take 2 tablets (40 mg total) by mouth 2 (two) times daily.       Follow-up Information    Nolene Ebbs, MD. Schedule an appointment as soon as possible for a visit in 1 week(s).   Specialty: Internal Medicine Contact information: [place] [address] [phone]        Lelon Perla, MD .   Specialty: Cardiology Contact information: [address] [place] [address] [phone]        Tanda Rockers, MD.  Schedule an appointment as soon as possible for a visit in 2 week(s).   Specialty: Pulmonary Disease Contact information: [place] [address] [phone]              No Known Allergies  Consultations: Nephrology   Procedures/Studies: DG Chest 1 View  Result Date: 04/16/2020 CLINICAL DATA:  Status post right thoracentesis EXAM: CHEST  1 VIEW COMPARISON:  04/12/2020 FINDINGS: Significant interval decrease in volume of right pleural effusion, now small and layering. No pneumothorax. No new airspace opacity. Unchanged mild cardiomegaly. IMPRESSION: Significant interval decrease in volume of right pleural effusion, now small and layering. No pneumothorax. No new airspace opacity. Electronically Signed   By: Eddie Candle M.D.   On: 04/16/2020 16:33   DG Chest 2 View  Result Date:  04/17/2020 CLINICAL DATA:  Worsening shortness of breath EXAM: CHEST - 2 VIEW COMPARISON:  01/17/2020, 04/16/2020 FINDINGS: Diffuse hazy opacity throughout the right thorax, suspect component of layering pleural effusion. Cardiomegaly with vascular congestion and probable mild pulmonary edema. Possible small left effusion. Dense airspace disease at the right base. No pneumothorax. Curvilinear wirelike opacity over the left clavicular area, possible external artifact, correlate with physical exam. IMPRESSION: Cardiomegaly with vascular congestion probable mild pulmonary edema. Hazy opacity throughout the right thorax, suspect component of layering pleural effusion. Dense airspace disease at the right base, atelectasis versus pneumonia, worsened aeration at right base since 04/16/2020 comparison. Electronically Signed   By: Donavan Foil M.D.   On: 04/17/2020 15:18   DG Chest 2 View  Result Date: 04/13/2020 CLINICAL DATA:  Recurrent right pleural effusion. EXAM: CHEST - 2 VIEW COMPARISON:  01/18/2020. FINDINGS: A small to moderate right-sided pleural effusion is suspected, but not visualized posteriorly on the lateral view. Suspected to be anterior. Midline trachea. Mild cardiomegaly. Significantly worsened right base aeration with airspace disease. Clear left lung. No overt congestive failure. IMPRESSION: Worsening right-sided aeration, likely due to a combination of airspace disease and possibly anteriorly loculated pleural fluid. Consider further delineation with (ideally contrast enhanced) chest CT. Cardiomegaly without congestive failure. Electronically Signed   By: Abigail Miyamoto M.D.   On: 04/13/2020 14:51   CT CHEST WO CONTRAST  Result Date: 04/18/2020 CLINICAL DATA:  Pneumonia.  Question abscess or empyema. EXAM: CT CHEST WITHOUT CONTRAST TECHNIQUE: Multidetector CT imaging of the chest was performed following the standard protocol without IV contrast. COMPARISON:  Chest radiography yesterday.  Chest  CT 11/01/2019 FINDINGS: Cardiovascular: Mild cardiomegaly. No pericardial effusion. Coronary artery calcification is present. Aortic atherosclerotic calcification is present. Mediastinum/Nodes: No mediastinal mass. No paratracheal lymphadenopathy. Adenopathy is likely present in the right hilar region, presumed reactive to the right lower lobe inflammatory disease. Mass lesion not excluded based on this exam. Lungs/Pleura: Left lung shows dependent atelectasis or infiltrate in the left lower lobe. Left upper lobe is clear. Right chest shows a moderate size effusion layering dependently. No evidence of loculation. There is infiltrate/consolidation/collapse affecting the right lower lobe. Some dependent infiltrate/volume loss present in the right upper lobe. Moderate hazy pneumonia in the right middle lobe. Upper Abdomen: Negative Musculoskeletal: Ordinary thoracic degenerative changes. IMPRESSION: 1. Right lower lobe infiltrate/consolidation/collapse. Some dependent infiltrate/volume loss in the right upper lobe. Moderate hazy pneumonia in the right middle lobe. Moderate size right effusion layering dependently without evidence of loculation. Findings may be due 2 pneumonia, but the possibility of tumor in the right inferior hilar region is not excluded by this exam. 2. Dependent atelectasis or infiltrate  in the left lower lobe. 3. Aortic atherosclerosis. Coronary artery calcification. Aortic Atherosclerosis (ICD10-I70.0). Electronically Signed   By: Nelson Chimes M.D.   On: 04/18/2020 12:51   US Abdomen Limited  Result Date: 04/20/2020 CLINICAL DATA:  Evaluation for ascites. EXAM: ULTRASOUND ABDOMEN LIMITED of all 4 abdominal quadrants COMPARISON:  None. FINDINGS: No free intraperitoneal fluid noted within the visualized 4 abdominal quadrants. IMPRESSION: No abdominal ascites. Electronically Signed   By: Iven Finn M.D.   On: 04/20/2020 18:21   DG CHEST PORT 1 VIEW  Result Date: 04/21/2020 CLINICAL  DATA:  Follow-up right pleural effusion EXAM: PORTABLE CHEST 1 VIEW COMPARISON:  April 17, 2020 FINDINGS: Right-sided pleural effusion with underlying opacity remains, similar to mildly improved in the interval. No other interval changes. IMPRESSION: The right-sided pleural effusion with underlying opacity is similar to mildly improved in the interval. No other changes. Electronically Signed   By: Dorise Bullion III M.D   On: 04/21/2020 12:55   US THORACENTESIS ASP PLEURAL SPACE W/IMG GUIDE  Result Date: 04/16/2020 INDICATION: Patient with history of hypertension, diabetes, CHF, chronic kidney disease, dyspnea, recurrent right pleural effusion; request received for diagnostic and therapeutic right thoracentesis. EXAM: ULTRASOUND GUIDED DIAGNOSTIC AND THERAPEUTIC RIGHT THORACENTESIS MEDICATIONS: 1% lidocaine to skin and subcutaneous tissue COMPLICATIONS: None immediate. PROCEDURE: An ultrasound guided thoracentesis was thoroughly discussed with the patient and questions answered. The benefits, risks, alternatives and complications were also discussed. The patient understands and wishes to proceed with the procedure. Written consent was obtained. Ultrasound was performed to localize and mark an adequate pocket of fluid in the right chest. The area was then prepped and draped in the normal sterile fashion. 1% Lidocaine was used for local anesthesia. Under ultrasound guidance a 6 Fr Safe-T-Centesis catheter was introduced. Thoracentesis was performed. The catheter was removed and a dressing applied. FINDINGS: A total of approximately 2 liters of hazy,amber fluid was removed. Samples were sent to the laboratory as requested by the clinical team. IMPRESSION: Successful ultrasound guided diagnostic and therapeutic right thoracentesis yielding 2 liters of pleural fluid. Read by: Rowe Robert, PA-C Electronically Signed   By: Aletta Edouard M.D.   On: 04/16/2020 16:14   Subjective: No new complaints.    Discharge Exam: Vitals:   04/21/20 0615 04/21/20 0950  BP: (!) 142/73 (!) 145/78  Pulse: 66   Resp: 16 18  Temp: 98.1 F (36.7 C) 98 F (36.7 C)  SpO2: 96% 97%   Vitals:   04/20/20 1653 04/20/20 2001 04/21/20 0615 04/21/20 0950  BP: 140/70 (!) 142/70 (!) 142/73 (!) 145/78  Pulse: 71 65 66   Resp: 16 18 16 18   Temp:  98.1 F (36.7 C) 98.1 F (36.7 C) 98 F (36.7 C)  TempSrc:  Oral Oral Oral  SpO2: 94% 97% 96% 97%  Weight:   90.2 kg   Height:        General: Pt is alert, awake, not in acute distress Cardiovascular: RRR, S1/S2 +, no rubs, no gallops Respiratory: CTA bilaterally, no wheezing, no rhonchi Abdominal: Soft, NT, ND, bowel sounds + Extremities: no edema, no cyanosis    The results of significant diagnostics from this hospitalization (including imaging, microbiology, ancillary and laboratory) are listed below for reference.     Microbiology: Recent Results (from the past 240 hour(s))  SARS CORONAVIRUS 2 (TAT 6-24 HRS) Nasopharyngeal Nasopharyngeal Swab     Status: None   Collection Time: 04/14/20  1:58 PM   Specimen: Nasopharyngeal Swab  Result Value Ref  Range Status   SARS Coronavirus 2 NEGATIVE NEGATIVE Final    Comment: (NOTE) SARS-CoV-2 target nucleic acids are NOT DETECTED.  The SARS-CoV-2 RNA is generally detectable in upper and lower respiratory specimens during the acute phase of infection. Negative results do not preclude SARS-CoV-2 infection, do not rule out co-infections with other pathogens, and should not be used as the sole basis for treatment or other patient management decisions. Negative results must be combined with clinical observations, patient history, and epidemiological information. The expected result is Negative.  Fact Sheet for Patients: SugarRoll.be  Fact Sheet for Healthcare Providers: https://www.woods-mathews.com/  This test is not yet approved or cleared by the [place]  FDA and  has been authorized for detection and/or diagnosis of SARS-CoV-2 by FDA under an Emergency Use Authorization (EUA). This EUA will remain  in effect (meaning this test can be used) for the duration of the COVID-19 declaration under Se ction 564(b)(1) of the Act, 21 U.S.C. section 360bbb-3(b)(1), unless the authorization is terminated or revoked sooner.  Performed at [facility], [place] [address], [place], [place] [address]   Resp Panel by RT-PCR (Flu A&B, Covid) Nasopharyngeal Swab     Status: None   Collection Time: 04/17/20  4:48 PM   Specimen: Nasopharyngeal Swab; Nasopharyngeal(NP) swabs in vial transport medium  Result Value Ref Range Status   SARS Coronavirus 2 by RT PCR NEGATIVE NEGATIVE Final    Comment: (NOTE) SARS-CoV-2 target nucleic acids are NOT DETECTED.  The SARS-CoV-2 RNA is generally detectable in upper respiratory specimens during the acute phase of infection. The lowest concentration of SARS-CoV-2 viral copies this assay can detect is 138 copies/mL. A negative result does not preclude SARS-Cov-2 infection and should not be used as the sole basis for treatment or other patient management decisions. A negative result may occur with  improper specimen collection/handling, submission of specimen other than nasopharyngeal swab, presence of viral mutation(s) within the areas targeted by this assay, and inadequate number of viral copies(<138 copies/mL). A negative result must be combined with clinical observations, patient history, and epidemiological information. The expected result is Negative.  Fact Sheet for Patients:  EntrepreneurPulse.com.au  Fact Sheet for Healthcare Providers:  IncredibleEmployment.be  This test is no t yet approved or cleared by the [place] FDA and  has been authorized for detection and/or diagnosis of SARS-CoV-2 by FDA under an Emergency Use Authorization (EUA). This EUA will remain   in effect (meaning this test can be used) for the duration of the COVID-19 declaration under Section 564(b)(1) of the Act, 21 U.S.C.section 360bbb-3(b)(1), unless the authorization is terminated  or revoked sooner.       Influenza A by PCR NEGATIVE NEGATIVE Final   Influenza B by PCR NEGATIVE NEGATIVE Final    Comment: (NOTE) The Xpert Xpress SARS-CoV-2/FLU/RSV plus assay is intended as an aid in the diagnosis of influenza from Nasopharyngeal swab specimens and should not be used as a sole basis for treatment. Nasal washings and aspirates are unacceptable for Xpert Xpress SARS-CoV-2/FLU/RSV testing.  Fact Sheet for Patients: EntrepreneurPulse.com.au  Fact Sheet for Healthcare Providers: IncredibleEmployment.be  This test is not yet approved or cleared by the [place] FDA and has been authorized for detection and/or diagnosis of SARS-CoV-2 by FDA under an Emergency Use Authorization (EUA). This EUA will remain in effect (meaning this test can be used) for the duration of the COVID-19 declaration under Section 564(b)(1) of the Act, 21 U.S.C. section 360bbb-3(b)(1), unless the authorization is terminated  or revoked.  Performed at [facility], [place] [address], [place], [place] [address]      Labs: BNP (last 3 results) Recent Labs    10/28/19 1527 12/23/19 0636 04/17/20 1657  BNP 745.1* 832.6* 716.9*   Basic Metabolic Panel: Recent Labs  Lab 04/17/20 1453 04/18/20 0225 04/19/20 0139 04/20/20 0225 04/21/20 0811  NA 140 139 140 142 140  K 3.7 3.7 4.0 3.9 4.1  CL 95* 95* 95* 93* 91*  CO2 28 32 32 33* 34*  GLUCOSE 186* 162* 192* 160* 125*  BUN 103* 106* 101* 98* 100*  CREATININE 4.18* 4.14* 4.14* 3.92* 3.79*  CALCIUM 7.8* 7.6* 7.8* 7.9* 8.3*  PHOS  --   --   --  6.3* 6.2*   Liver Function Tests: Recent Labs  Lab 04/17/20 2245 04/20/20 0225 04/21/20 0811  AST 11*  --   --   ALT 12  --   --   ALKPHOS 84  --    --   BILITOT 0.8  --   --   PROT 7.3  --   --   ALBUMIN 3.0* 3.1* 3.3*   No results for input(s): LIPASE, AMYLASE in the last 168 hours. No results for input(s): AMMONIA in the last 168 hours. CBC: Recent Labs  Lab 04/17/20 1453 04/18/20 0225 04/19/20 0139 04/20/20 0225 04/21/20 0207  WBC 8.2 7.5 7.6 8.0 8.1  HGB 11.1* 10.8* 10.7* 11.3* 10.7*  HCT 36.4* 35.0* 32.8* 36.8* 34.7*  MCV 86.3 86.4 84.3 86.4 86.5  PLT 255 239 274 266 252   Cardiac Enzymes: No results for input(s): CKTOTAL, CKMB, CKMBINDEX, TROPONINI in the last 168 hours. BNP: Invalid input(s): POCBNP CBG: Recent Labs  Lab 04/20/20 1130 04/20/20 1610 04/20/20 2138 04/21/20 0744 04/21/20 1146  GLUCAP 133* 196* 229* 121* 175*   D-Dimer No results for input(s): DDIMER in the last 72 hours. Hgb A1c No results for input(s): HGBA1C in the last 72 hours. Lipid Profile No results for input(s): CHOL, HDL, LDLCALC, TRIG, CHOLHDL, LDLDIRECT in the last 72 hours. Thyroid function studies No results for input(s): TSH, T4TOTAL, T3FREE, THYROIDAB in the last 72 hours.  Invalid input(s): FREET3 Anemia work up No results for input(s): VITAMINB12, FOLATE, FERRITIN, TIBC, IRON, RETICCTPCT in the last 72 hours. Urinalysis    Component Value Date/Time   COLORURINE YELLOW 12/28/2019 1850   APPEARANCEUR CLEAR 12/28/2019 1850   LABSPEC 1.014 12/28/2019 1850   PHURINE 5.0 12/28/2019 1850   GLUCOSEU NEGATIVE 12/28/2019 1850   HGBUR NEGATIVE 12/28/2019 1850   BILIRUBINUR NEGATIVE 12/28/2019 1850   KETONESUR NEGATIVE 12/28/2019 1850   PROTEINUR 30 (A) 12/28/2019 1850   UROBILINOGEN 0.2 06/17/2010 1620   NITRITE NEGATIVE 12/28/2019 1850   LEUKOCYTESUR NEGATIVE 12/28/2019 1850   Sepsis Labs Invalid input(s): PROCALCITONIN,  WBC,  LACTICIDVEN Microbiology Recent Results (from the past 240 hour(s))  SARS CORONAVIRUS 2 (TAT 6-24 HRS) Nasopharyngeal Nasopharyngeal Swab     Status: None   Collection Time: 04/14/20  1:58 PM    Specimen: Nasopharyngeal Swab  Result Value Ref Range Status   SARS Coronavirus 2 NEGATIVE NEGATIVE Final    Comment: (NOTE) SARS-CoV-2 target nucleic acids are NOT DETECTED.  The SARS-CoV-2 RNA is generally detectable in upper and lower respiratory specimens during the acute phase of infection. Negative results do not preclude SARS-CoV-2 infection, do not rule out co-infections with other pathogens, and should not be used as the sole basis for treatment or other patient management decisions. Negative results must  be combined with clinical observations, patient history, and epidemiological information. The expected result is Negative.  Fact Sheet for Patients: SugarRoll.be  Fact Sheet for Healthcare Providers: https://www.woods-mathews.com/  This test is not yet approved or cleared by the [place] FDA and  has been authorized for detection and/or diagnosis of SARS-CoV-2 by FDA under an Emergency Use Authorization (EUA). This EUA will remain  in effect (meaning this test can be used) for the duration of the COVID-19 declaration under Se ction 564(b)(1) of the Act, 21 U.S.C. section 360bbb-3(b)(1), unless the authorization is terminated or revoked sooner.  Performed at [facility], [place] [address] [place], [place] [address]   Resp Panel by RT-PCR (Flu A&B, Covid) Nasopharyngeal Swab     Status: None   Collection Time: 04/17/20  4:48 PM   Specimen: Nasopharyngeal Swab; Nasopharyngeal(NP) swabs in vial transport medium  Result Value Ref Range Status   SARS Coronavirus 2 by RT PCR NEGATIVE NEGATIVE Final    Comment: (NOTE) SARS-CoV-2 target nucleic acids are NOT DETECTED.  The SARS-CoV-2 RNA is generally detectable in upper respiratory specimens during the acute phase of infection. The lowest concentration of SARS-CoV-2 viral copies this assay can detect is 138 copies/mL. A negative result does not preclude  SARS-Cov-2 infection and should not be used as the sole basis for treatment or other patient management decisions. A negative result may occur with  improper specimen collection/handling, submission of specimen other than nasopharyngeal swab, presence of viral mutation(s) within the areas targeted by this assay, and inadequate number of viral copies(<138 copies/mL). A negative result must be combined with clinical observations, patient history, and epidemiological information. The expected result is Negative.  Fact Sheet for Patients:  EntrepreneurPulse.com.au  Fact Sheet for Healthcare Providers:  IncredibleEmployment.be  This test is no t yet approved or cleared by the [place] FDA and  has been authorized for detection and/or diagnosis of SARS-CoV-2 by FDA under an Emergency Use Authorization (EUA). This EUA will remain  in effect (meaning this test can be used) for the duration of the COVID-19 declaration under Section 564(b)(1) of the Act, 21 U.S.C.section 360bbb-3(b)(1), unless the authorization is terminated  or revoked sooner.       Influenza A by PCR NEGATIVE NEGATIVE Final   Influenza B by PCR NEGATIVE NEGATIVE Final    Comment: (NOTE) The Xpert Xpress SARS-CoV-2/FLU/RSV plus assay is intended as an aid in the diagnosis of influenza from Nasopharyngeal swab specimens and should not be used as a sole basis for treatment. Nasal washings and aspirates are unacceptable for Xpert Xpress SARS-CoV-2/FLU/RSV testing.  Fact Sheet for Patients: EntrepreneurPulse.com.au  Fact Sheet for Healthcare Providers: IncredibleEmployment.be  This test is not yet approved or cleared by the [place] FDA and has been authorized for detection and/or diagnosis of SARS-CoV-2 by FDA under an Emergency Use Authorization (EUA). This EUA will remain in effect (meaning this test can be used) for the duration of  the COVID-19 declaration under Section 564(b)(1) of the Act, 21 U.S.C. section 360bbb-3(b)(1), unless the authorization is terminated or revoked.  Performed at [facility], [place] [address], [place], [place] [address]      Time coordinating discharge: 36 minutes.   SIGNED:   Hosie Poisson, MD  [facility]

## 2020-04-30 ENCOUNTER — Ambulatory Visit (INDEPENDENT_AMBULATORY_CARE_PROVIDER_SITE_OTHER): Payer: Medicare Other

## 2020-04-30 ENCOUNTER — Other Ambulatory Visit: Payer: Self-pay

## 2020-04-30 ENCOUNTER — Encounter: Payer: Self-pay | Admitting: Pulmonary Disease

## 2020-04-30 ENCOUNTER — Ambulatory Visit (INDEPENDENT_AMBULATORY_CARE_PROVIDER_SITE_OTHER): Payer: Medicare Other | Admitting: Pulmonary Disease

## 2020-04-30 VITALS — BP 118/60 | HR 67 | Temp 97.3°F | Ht 70.0 in | Wt 199.8 lb

## 2020-04-30 DIAGNOSIS — R918 Other nonspecific abnormal finding of lung field: Secondary | ICD-10-CM

## 2020-04-30 DIAGNOSIS — N184 Chronic kidney disease, stage 4 (severe): Secondary | ICD-10-CM

## 2020-04-30 DIAGNOSIS — I5020 Unspecified systolic (congestive) heart failure: Secondary | ICD-10-CM

## 2020-04-30 DIAGNOSIS — J9611 Chronic respiratory failure with hypoxia: Secondary | ICD-10-CM | POA: Diagnosis not present

## 2020-04-30 DIAGNOSIS — E1122 Type 2 diabetes mellitus with diabetic chronic kidney disease: Secondary | ICD-10-CM

## 2020-04-30 DIAGNOSIS — J90 Pleural effusion, not elsewhere classified: Secondary | ICD-10-CM

## 2020-04-30 NOTE — Assessment & Plan Note (Signed)
°  Plan: Continue follow-up with [facility] Dr. Royce Macadamia

## 2020-04-30 NOTE — Patient Instructions (Addendum)
You were seen today by Lauraine Rinne, NP  for:   1. Abnormal findings on diagnostic imaging of lung 2. Pleural effusion  - DG Chest 2 View; Future - CT Chest Wo Contrast; Future  3. Chronic respiratory failure with hypoxia (HCC)  Walk today in office  We will order overnight oximetry test to check oxygen levels at night  Continue oxygen therapy as prescribed  >>>maintain oxygen saturations greater than 88 percent  >>>if unable to maintain oxygen saturations please contact the office  >>>do not smoke with oxygen  >>>can use nasal saline gel or nasal saline rinses to moisturize nose if oxygen causes dryness  4. CKD stage 4 due to type 2 diabetes mellitus ([place])  Continue medications as managed by nephrology  Keep follow-up with nephrology  5. Systolic congestive heart failure, unspecified HF chronicity ([place])  Schedule a hospital follow-up for an in person visit with cardiology  Would recommend that January/2022 televisit be changed to an in person   We recommend today:  Orders Placed This Encounter  Procedures  . DG Chest 2 View    Standing Status:   Future    Number of Occurrences:   1    Standing Expiration Date:   08/29/2020    Order Specific Question:   Reason for Exam (SYMPTOM  OR DIAGNOSIS REQUIRED)    Answer:   PNU    Order Specific Question:   Preferred imaging location?    Answer:   Internal    Order Specific Question:   Radiology Contrast Protocol - do NOT remove file path    Answer:   [email or web address]  . CT Chest Wo Contrast    Standing Status:   Future    Standing Expiration Date:   04/30/2021    Order Specific Question:   Preferred imaging location?    Answer:   [facility]   Orders Placed This Encounter  Procedures  . DG Chest 2 View  . CT Chest Wo Contrast   No orders of the defined types were placed in this encounter.   Follow Up:    Return in about 4 weeks (around 05/28/2020), or if  symptoms worsen or fail to improve, for Follow up with Dr. Melvyn Novas.   Notification of test results are managed in the following manner: If there are  any recommendations or changes to the  plan of care discussed in office today,  we will contact you and let you know what they are. If you do not hear from [place], then your results are normal and you can view them through your  MyChart account , or a letter will be sent to you. Thank you again for trusting [place] with your care  - Thank you, [facility]    It is flu season:   >>> Best ways to protect herself from the flu: Receive the yearly flu vaccine, practice good hand hygiene washing with soap and also using hand sanitizer when available, eat a nutritious meals, get adequate rest, hydrate appropriately       Please contact the office if your symptoms worsen or you have concerns that you are not improving.   Thank you for choosing [facility] for your healthcare, and for allowing [place] to partner with you on your healthcare journey. I am thankful to be able to provide care to you today.   Wyn Quaker FNP-C

## 2020-04-30 NOTE — Assessment & Plan Note (Signed)
Plan: Chest x-ray today Repeat CT imaging in 4 to 6 weeks

## 2020-04-30 NOTE — Assessment & Plan Note (Signed)
Plan: Continue oxygen therapy Maintain oxygen saturations above 88% Overnight oximetry ordered

## 2020-04-30 NOTE — Assessment & Plan Note (Signed)
Plan: Schedule follow-up with cardiology team Continue diuretics Continue 1500 cc fluid restriction

## 2020-04-30 NOTE — Progress Notes (Signed)
_0  ID: Melvin Mcclure, male    DOB: 1950/10/26, 69 y.o.   MRN: [id number]  Chief Complaint  Patient presents with  . Hospitalization Follow-up    Good and bad days since discharge, Some SOB at times    Referring provider: Nolene Ebbs, MD  HPI:  69 year old male former smoker followed in our office for recurrent right-sided pleural effusion and chronic respiratory failure  PMH: Type 2 diabetes, chronic kidney disease stage IV, CHF, hypertension Smoker/ Smoking History: Former Smoker  Maintenance: None Pt of: Dr. Melvyn Novas  04/30/2020  - Visit   69 year old male former smoker followed in our office for recurrent pleural effusion.  Patient presenting today as a hospital follow-up.  Patient was last seen in the hospital on 04/17/2020.  Discharged on 04/21/2020.  Excerpt of that discharge summary is listed below:   Admit date: 04/17/2020 Discharge date: 04/21/2020  Admitted From: Home.  Disposition: Home.  Recommendations for Outpatient Follow-up:  1. Follow up with PCP in 1-2 weeks 2. Please obtain BMP/CBC in one week Please follow up with Dr Melvyn Novas in 2 to 4 weeks, and repeat imaging of the chest with CT to evaluate for resolution of the pneumonia and to rule out lung mass.   Discharge Condition:guarded.  CODE STATUS: FULL CODE.  Diet recommendation: renal diet.   Brief/Interim Summary: 69 year old gentleman prior history of stage IV CKD, heart failure with preserved EF, type 2 diabetes mellitus and hypertension presents with worsening shortness of breath and productive cough. Patient underwent right thoracentesis on 04/17/2019 2 L of fluid was removed despite that patient continues to have worsening shortness of breath and presented to the ED. On arrival to ED he was hypoxic and tachypneic. Chest x-ray showed notable for vascular congestion and mild pulmonary edema, hazy opacity throughout the right thorax suspicious for layering pleural effusion, increased density at  the right base, suspicious for pneumonia versus atelectasis. Patient was admitted to [facility] for acute respiratory failure with hypoxia requiring up to 2 L of nasal cannula oxygen. Nephrology consulted, and he was started on high dose diuretics.CT chest with out contrast showed Right lower lobe infiltrate/consolidation/collapse. Some dependent infiltrate/volume loss in the right upper lobe. Moderate hazy pneumonia in the right middle lobe. Moderate size right effusion layering dependently without evidence of loculation. Findings may be due 2 pneumonia, but the possibility of tumor in the right inferior hilar region is not excluded by this exam. Recommend outpatient follow up with Dr Melvyn Novas with pulmonology in 2 to 4 weeks to evaluate for resolution of the pneumonia and evaluation of tumor.   Discharge Diagnoses:  Principal Problem:   Acute on chronic respiratory failure with hypoxia (HCC) Active Problems:   Acute on chronic diastolic CHF (congestive heart failure) (HCC)   DM2 (diabetes mellitus, type 2) (HCC)   CKD stage 4 due to type 2 diabetes mellitus ([place])   Hypertensive cardiomyopathy, with heart failure ([place])   CAP (community acquired pneumonia)   Pleural effusion   HTN (hypertension)   Acute hypoxic respiratory failure requiring 2 lit/mi nasal cannula oxygen to keep sats greater than 90%. Probably secondary to a combination of pulmonary edema, from acute on chronic diastolic heart failure, worsening stage IV CKD , right pleural effusion, possible community-acquired pneumonia,  Patient underwent thoracentesis as outpatient on 04/16/2020. Transudate . Cytology showed no malignant cells. He was started on IV Rocephin and Zithromax for possible community-acquired pneumonia. Transitioned to oral antibiotics on discharge. We have weaned his oxygen down to 1 L today  and plan to wean him off oxygen by tomorrow He was also started on IV lasix 120 mg BID for pulm edema,transition to oral  torsemide today. CT of the chest without contrast shows pneumonia in the right middle lobe, right lower lobe infiltrate/consolidation/collapse with moderate right sided effusion without evidence of loculation. Recommend outpatient follow-up with Dr. Melvyn Novas with pulmonology in 2 to 4 weeks to evaluate for repeat imaging and resolution of the pneumonia Urine fore strep pneumo antigen is negative. Pro calcitonin is less than 0.10    Stage 4 CKD:  Baseline creatinine is between 3.5 and 4. Stable around 4.1   Hypertension:Blood pressure are optimal  Anemia of chronic disease probably from CRF.  Hemoglobin of 10.8 this am.    Type 2 DM;  resume home meds on discharge.  Resume SSI. Hemoglobin A1c is 7.3. No changes in medications at this time.  Patient is also followed by cardiology.  Last seen telephonically by cardiology NP on 03/01/2020.  Last seen in person on 01/17/2020 by Dr. Debara Pickett.  See assessment and plan from that office visit listed below:  ASSESSMENT: 3. Acute hypoxic respiratory failure 4. Acute on chronic diastolic congestive heart failure 5. Recurrent right pleural effusion suspected 6. CKD 3-4  PLAN: 1.   Mr. Halbig has acute hypoxic respiratory failure with oxygen saturation in the 80s today including significant fatigue and progressive worsening dyspnea.  He was off of diuretics for 2 weeks according to his wife because of acute kidney injury after his recent hospitalization however appears to have reaccumulated fluid despite recently restarting diuretics at the recommendation of his nephrologist.  On exam he has dullness to percussion about a third of the way up of the right lung suspicious for recurrent right pleural effusion.  He did have thoracentesis about 1 month ago almost during his hospitalization.  Because of the significant hypoxia, I think he will need oxygen, repeat chest x-ray and lab work, all of which is best accomplished in the hospital.  We have  directed him to the emergency department and notify them of his arrival.  Patient presenting to her office today reporting is having good and bad days since being discharged.  He has occasional dyspnea.  Sometimes this requires him putting on 2 L of O2.  Weights are stable at 199 today.  Patient reports that he is eating better now.  He feels his appetite is getting back to normal but is not yet at his baseline.  He reports adherence to following a 1500 cc liquid restriction managed by cardiology and nephrology.  He is established with Dr. Harrie Jeans with [facility] for nephrology.  He is also seen by Dr. Debara Pickett with cardiology.  He denies any recent fevers.  Patient reports that he has been weighing himself regularly.  He never really has lower extremity edema.  Typically he carries edema around his waistline.  Questionaires / Pulmonary Flowsheets:   ACT:  No flowsheet data found.  MMRC: No flowsheet data found.  Epworth:  No flowsheet data found.  Tests:   04/18/2020-CT chest without contrast-right lower lobe infiltrate/consolidation/collapse, some dependent infiltrate/volume loss in the right upper lobe, moderate hazy pneumonia in the right middle lobe, moderate sized right effusion layering dependently without evidence of loculation, findings may be due to pneumonia but possibly tumor in the right inferior hilar is not excluded in this exam, dependent atelectasis or infiltrate in the right lower lobe, aortic arthrosclerosis  04/16/2020-thoracentesis-right thoracentesis-2 L removed   01/18/2020-IR thoracentesis-right-sided thoracentesis-1.75  L removed  12/26/2019-right thoracentesis-700 cc of pleural fluid removed  10/29/2019-echocardiogram-LV ejection fraction 55 to 60%, moderate LV hypertrophy, right ventricular systolic function is normal  FENO:  No results found for: NITRICOXIDE  PFT: No flowsheet data found.  WALK:  No flowsheet data found.  Imaging: DG Chest 1  View  Result Date: 04/16/2020 CLINICAL DATA:  Status post right thoracentesis EXAM: CHEST  1 VIEW COMPARISON:  04/12/2020 FINDINGS: Significant interval decrease in volume of right pleural effusion, now small and layering. No pneumothorax. No new airspace opacity. Unchanged mild cardiomegaly. IMPRESSION: Significant interval decrease in volume of right pleural effusion, now small and layering. No pneumothorax. No new airspace opacity. Electronically Signed   By: Eddie Candle M.D.   On: 04/16/2020 16:33   DG Chest 2 View  Result Date: 04/30/2020 CLINICAL DATA:  69 year old male with right pleural effusion and pneumonia earlier this month. EXAM: CHEST - 2 VIEW COMPARISON:  Chest radiographs 04/21/2020, CT 04/18/2020, and earlier. FINDINGS: Regressed but not resolved abnormal opacity at the medial right lung base. No convincing residual pleural fluid on the lateral. But dense right middle and/or lower lobe opacity is suspected. Linear atelectasis along the right minor fissure. Stable cardiac size and mediastinal contours. No pneumothorax or pulmonary edema. Visualized tracheal air column is within normal limits. No acute osseous abnormality identified. Negative visible bowel gas pattern. IMPRESSION: Right pleural effusion seems resolved since the CT on 04/18/2020, but dense opacity remains at the medial right lung base. Repeat Chest CT with IV contrast (or alternatively bronchoscopy) is recommended to exclude a hilar mass. Electronically Signed   By: Genevie Ann M.D.   On: 04/30/2020 10:32   DG Chest 2 View  Result Date: 04/17/2020 CLINICAL DATA:  Worsening shortness of breath EXAM: CHEST - 2 VIEW COMPARISON:  01/17/2020, 04/16/2020 FINDINGS: Diffuse hazy opacity throughout the right thorax, suspect component of layering pleural effusion. Cardiomegaly with vascular congestion and probable mild pulmonary edema. Possible small left effusion. Dense airspace disease at the right base. No pneumothorax. Curvilinear  wirelike opacity over the left clavicular area, possible external artifact, correlate with physical exam. IMPRESSION: Cardiomegaly with vascular congestion probable mild pulmonary edema. Hazy opacity throughout the right thorax, suspect component of layering pleural effusion. Dense airspace disease at the right base, atelectasis versus pneumonia, worsened aeration at right base since 04/16/2020 comparison. Electronically Signed   By: Donavan Foil M.D.   On: 04/17/2020 15:18   DG Chest 2 View  Result Date: 04/13/2020 CLINICAL DATA:  Recurrent right pleural effusion. EXAM: CHEST - 2 VIEW COMPARISON:  01/18/2020. FINDINGS: A small to moderate right-sided pleural effusion is suspected, but not visualized posteriorly on the lateral view. Suspected to be anterior. Midline trachea. Mild cardiomegaly. Significantly worsened right base aeration with airspace disease. Clear left lung. No overt congestive failure. IMPRESSION: Worsening right-sided aeration, likely due to a combination of airspace disease and possibly anteriorly loculated pleural fluid. Consider further delineation with (ideally contrast enhanced) chest CT. Cardiomegaly without congestive failure. Electronically Signed   By: Abigail Miyamoto M.D.   On: 04/13/2020 14:51   CT CHEST WO CONTRAST  Result Date: 04/18/2020 CLINICAL DATA:  Pneumonia.  Question abscess or empyema. EXAM: CT CHEST WITHOUT CONTRAST TECHNIQUE: Multidetector CT imaging of the chest was performed following the standard protocol without IV contrast. COMPARISON:  Chest radiography yesterday.  Chest CT 11/01/2019 FINDINGS: Cardiovascular: Mild cardiomegaly. No pericardial effusion. Coronary artery calcification is present. Aortic atherosclerotic calcification is present. Mediastinum/Nodes: No mediastinal mass. No paratracheal  lymphadenopathy. Adenopathy is likely present in the right hilar region, presumed reactive to the right lower lobe inflammatory disease. Mass lesion not excluded  based on this exam. Lungs/Pleura: Left lung shows dependent atelectasis or infiltrate in the left lower lobe. Left upper lobe is clear. Right chest shows a moderate size effusion layering dependently. No evidence of loculation. There is infiltrate/consolidation/collapse affecting the right lower lobe. Some dependent infiltrate/volume loss present in the right upper lobe. Moderate hazy pneumonia in the right middle lobe. Upper Abdomen: Negative Musculoskeletal: Ordinary thoracic degenerative changes. IMPRESSION: 1. Right lower lobe infiltrate/consolidation/collapse. Some dependent infiltrate/volume loss in the right upper lobe. Moderate hazy pneumonia in the right middle lobe. Moderate size right effusion layering dependently without evidence of loculation. Findings may be due 2 pneumonia, but the possibility of tumor in the right inferior hilar region is not excluded by this exam. 2. Dependent atelectasis or infiltrate in the left lower lobe. 3. Aortic atherosclerosis. Coronary artery calcification. Aortic Atherosclerosis (ICD10-I70.0). Electronically Signed   By: Nelson Chimes M.D.   On: 04/18/2020 12:51   US Abdomen Limited  Result Date: 04/20/2020 CLINICAL DATA:  Evaluation for ascites. EXAM: ULTRASOUND ABDOMEN LIMITED of all 4 abdominal quadrants COMPARISON:  None. FINDINGS: No free intraperitoneal fluid noted within the visualized 4 abdominal quadrants. IMPRESSION: No abdominal ascites. Electronically Signed   By: Iven Finn M.D.   On: 04/20/2020 18:21   DG CHEST PORT 1 VIEW  Result Date: 04/21/2020 CLINICAL DATA:  Follow-up right pleural effusion EXAM: PORTABLE CHEST 1 VIEW COMPARISON:  April 17, 2020 FINDINGS: Right-sided pleural effusion with underlying opacity remains, similar to mildly improved in the interval. No other interval changes. IMPRESSION: The right-sided pleural effusion with underlying opacity is similar to mildly improved in the interval. No other changes. Electronically  Signed   By: Dorise Bullion III M.D   On: 04/21/2020 12:55   US THORACENTESIS ASP PLEURAL SPACE W/IMG GUIDE  Result Date: 04/16/2020 INDICATION: Patient with history of hypertension, diabetes, CHF, chronic kidney disease, dyspnea, recurrent right pleural effusion; request received for diagnostic and therapeutic right thoracentesis. EXAM: ULTRASOUND GUIDED DIAGNOSTIC AND THERAPEUTIC RIGHT THORACENTESIS MEDICATIONS: 1% lidocaine to skin and subcutaneous tissue COMPLICATIONS: None immediate. PROCEDURE: An ultrasound guided thoracentesis was thoroughly discussed with the patient and questions answered. The benefits, risks, alternatives and complications were also discussed. The patient understands and wishes to proceed with the procedure. Written consent was obtained. Ultrasound was performed to localize and mark an adequate pocket of fluid in the right chest. The area was then prepped and draped in the normal sterile fashion. 1% Lidocaine was used for local anesthesia. Under ultrasound guidance a 6 Fr Safe-T-Centesis catheter was introduced. Thoracentesis was performed. The catheter was removed and a dressing applied. FINDINGS: A total of approximately 2 liters of hazy,amber fluid was removed. Samples were sent to the laboratory as requested by the clinical team. IMPRESSION: Successful ultrasound guided diagnostic and therapeutic right thoracentesis yielding 2 liters of pleural fluid. Read by: Rowe Robert, PA-C Electronically Signed   By: Aletta Edouard M.D.   On: 04/16/2020 16:14    Lab Results:  CBC    Component Value Date/Time   WBC 8.1 04/21/2020 0207   RBC 4.01 (L) 04/21/2020 0207   HGB 10.7 (L) 04/21/2020 0207   HCT 34.7 (L) 04/21/2020 0207   PLT 252 04/21/2020 0207   MCV 86.5 04/21/2020 0207   MCH 26.7 04/21/2020 0207   MCHC 30.8 04/21/2020 0207   RDW 15.1 04/21/2020 0207  LYMPHSABS 1.7 12/23/2019 0636   MONOABS 0.8 12/23/2019 0636   EOSABS 0.3 12/23/2019 0636   BASOSABS 0.1  12/23/2019 0636    BMET    Component Value Date/Time   NA 140 04/21/2020 0811   NA 141 06/15/2018 1050   K 4.1 04/21/2020 0811   CL 91 (L) 04/21/2020 0811   CO2 34 (H) 04/21/2020 0811   GLUCOSE 125 (H) 04/21/2020 0811   BUN 100 (H) 04/21/2020 0811   BUN 24 06/15/2018 1050   CREATININE 3.79 (H) 04/21/2020 0811   CALCIUM 8.3 (L) 04/21/2020 0811   GFRNONAA 16 (L) 04/21/2020 0811   GFRAA 16 (L) 01/23/2020 0229    BNP    Component Value Date/Time   BNP 480.6 (H) 04/17/2020 1657    ProBNP No results found for: PROBNP  Specialty Problems      Pulmonary Problems   CAP (community acquired pneumonia)   Acute respiratory failure with hypoxia (HCC)   Pleural effusion    R  thoracentesis on 12/26/2019  X 700 cc  Exudative with wbc 1429 and 78% L/ neg cytology  -  R  thoracentesis on 01/18/2020 with 1.75 L removed  -  R T centesis x   2 liters >>>  Exudate  With nl glucase  wbc 867   80% Lymphs >  Cyt neg/ flow cytometry >>>      Chronic respiratory failure with hypoxia (HCC)      No Known Allergies  Immunization History  Administered Date(s) Administered  . Influenza-Unspecified 03/06/2015  . Moderna Sars-Covid-2 Vaccination 06/16/2019, 07/19/2019  . Pneumococcal Polysaccharide-23 06/02/2018    Past Medical History:  Diagnosis Date  . Anemia in chronic kidney disease (CKD)   . CHF (congestive heart failure) ([place]) 05/28/2018   dx 05/28/18  . Diabetes mellitus without complication ([place])   . Hyperlipidemia   . Hypertension   . Iron deficiency anemia   . Perforating neurotrophic ulcer of foot ([place])     Tobacco History: Social History   Tobacco Use  Smoking Status Former Smoker  Smokeless Tobacco Never Used  Tobacco Comment   smoked for only 1.5 years in the 70's   Counseling given: Yes Comment: smoked for only 1.5 years in the 70's   Continue to not smoke  Outpatient Encounter Medications as of 04/30/2020  Medication Sig  . amLODipine (NORVASC) 10 MG  tablet Take 10 mg by mouth daily.  Marland Kitchen aspirin 81 MG tablet Take 81 mg by mouth daily.  . blood glucose meter kit and supplies KIT Dispense based on patient and insurance preference. Use up to four times daily as directed. (FOR ICD-9 250.00, 250.01).  . calcitRIOL (ROCALTROL) 0.25 MCG capsule Take 0.25 mcg by mouth daily. Pt takes 1 tablet daily  . calcium acetate (PHOSLO) 667 MG capsule Take 1 capsule (667 mg total) by mouth 3 (three) times daily with meals.  . carvedilol (COREG) 12.5 MG tablet Take 1 tablet (12.5 mg total) by mouth 2 (two) times daily with a meal.  . ferrous sulfate 325 (65 FE) MG tablet Take 325 mg by mouth daily with breakfast.  . gabapentin (NEURONTIN) 300 MG capsule Take 1 capsule (300 mg total) by mouth 2 (two) times daily.  . hydrALAZINE (APRESOLINE) 50 MG tablet Take 1 tablet (50 mg total) by mouth every 8 (eight) hours. (Patient taking differently: Take 50 mg by mouth 3 (three) times daily. Pt takes 1 tablet daily)  . isosorbide mononitrate (IMDUR) 60 MG 24 hr tablet Take 1  tablet (60 mg total) by mouth every evening.  . meclizine (ANTIVERT) 25 MG tablet Take 1 tablet (25 mg total) by mouth 3 (three) times daily.  Marland Kitchen senna-docusate (SENOKOT-S) 8.6-50 MG tablet Take 1 tablet by mouth 2 (two) times daily as needed for mild constipation.   . [DISCONTINUED] amoxicillin-clavulanate (AUGMENTIN) 500-125 MG tablet Take 1 tablet (500 mg total) by mouth daily.  . diclofenac Sodium (VOLTAREN) 1 % GEL Apply 2 g topically 4 (four) times daily. (Patient not taking: Reported on 04/30/2020)  . hydrOXYzine (ATARAX/VISTARIL) 25 MG tablet Take 25 mg by mouth 2 (two) times daily as needed for anxiety. (Patient not taking: Reported on 04/30/2020)  . metolazone (ZAROXOLYN) 5 MG tablet Take 1 tablet (5 mg total) by mouth as needed (Twice weekly as needed for shortness of breath, swelling, weight gain). (Patient not taking: Reported on 04/30/2020)  . torsemide (DEMADEX) 20 MG tablet Take 2 tablets  (40 mg total) by mouth 2 (two) times daily.   No facility-administered encounter medications on file as of 04/30/2020.     Review of Systems  Review of Systems  Constitutional: Positive for fatigue. Negative for activity change, chills, fever and unexpected weight change.  HENT: Negative for postnasal drip, rhinorrhea, sinus pressure, sinus pain and sore throat.   Eyes: Negative.   Respiratory: Positive for shortness of breath. Negative for cough and wheezing.   Cardiovascular: Negative for chest pain and palpitations.  Gastrointestinal: Negative for constipation, diarrhea, nausea and vomiting.  Endocrine: Negative.   Genitourinary: Negative.   Musculoskeletal: Negative.   Skin: Negative.   Neurological: Negative for dizziness and headaches.  Psychiatric/Behavioral: Negative.  Negative for dysphoric mood. The patient is not nervous/anxious.   All other systems reviewed and are negative.    Physical Exam  BP 118/60 (BP Location: Left Arm, Cuff Size: Normal)   Pulse 67   Temp (!) 97.3 F (36.3 C) (Oral)   Ht 5' 10" (1.778 m)   Wt 199 lb 12.8 oz (90.6 kg)   SpO2 92%   BMI 28.67 kg/m   Wt Readings from Last 5 Encounters:  04/30/20 199 lb 12.8 oz (90.6 kg)  04/21/20 198 lb 12.8 oz (90.2 kg)  04/12/20 205 lb 9.6 oz (93.3 kg)  03/01/20 199 lb (90.3 kg)  01/23/20 196 lb 6.4 oz (89.1 kg)    BMI Readings from Last 5 Encounters:  04/30/20 28.67 kg/m  04/21/20 28.52 kg/m  04/12/20 29.50 kg/m  03/01/20 28.55 kg/m  01/23/20 28.18 kg/m     Physical Exam Vitals and nursing note reviewed.  Constitutional:      General: He is not in acute distress.    Appearance: Normal appearance. He is normal weight.  HENT:     Head: Normocephalic and atraumatic.     Right Ear: Hearing and external ear normal.     Left Ear: Hearing and external ear normal.     Nose: Nose normal. No mucosal edema or rhinorrhea.     Right Turbinates: Not enlarged.     Left Turbinates: Not enlarged.      Mouth/Throat:     Mouth: Mucous membranes are dry.     Pharynx: Oropharynx is clear. No oropharyngeal exudate.  Eyes:     Pupils: Pupils are equal, round, and reactive to light.  Cardiovascular:     Rate and Rhythm: Normal rate and regular rhythm.     Pulses: Normal pulses.     Heart sounds: Normal heart sounds. No murmur heard.   Pulmonary:  Effort: Pulmonary effort is normal.     Breath sounds: Examination of the right-lower field reveals decreased breath sounds. Decreased breath sounds present. No wheezing or rales.  Musculoskeletal:     Cervical back: Normal range of motion.     Right lower leg: No edema.     Left lower leg: No edema.  Lymphadenopathy:     Cervical: No cervical adenopathy.  Skin:    General: Skin is warm and dry.     Capillary Refill: Capillary refill takes less than 2 seconds.     Findings: No erythema or rash.  Neurological:     General: No focal deficit present.     Mental Status: He is alert and oriented to person, place, and time.     Motor: No weakness.     Coordination: Coordination normal.     Gait: Gait is intact. Gait normal.  Psychiatric:        Mood and Affect: Mood normal.        Behavior: Behavior normal. Behavior is cooperative.        Thought Content: Thought content normal.        Judgment: Judgment normal.       Assessment & Plan:   CHF (congestive heart failure) (HCC) Plan: Schedule follow-up with cardiology team Continue diuretics Continue 1500 cc fluid restriction  Chronic respiratory failure with hypoxia (HCC) Plan: Continue oxygen therapy Maintain oxygen saturations above 88% Overnight oximetry ordered  Pleural effusion Status post 3 thoracentesis Mainly exudative effusions Most recent being on 04/16/2020  Plan: Chest x-ray today Continue diuretics We will plan on CT imaging in 4 to 6 weeks  CKD stage 4 due to type 2 diabetes mellitus ([place])  Plan: Continue follow-up with [facility] Dr.  Royce Macadamia  Abnormal findings on diagnostic imaging of lung Plan: Chest x-ray today Repeat CT imaging in 4 to 6 weeks    Return in about 4 weeks (around 05/28/2020), or if symptoms worsen or fail to improve, for Follow up with Dr. Melvyn Novas.   Lauraine Rinne, NP 04/30/2020   This appointment required 42 minutes of patient care (this includes precharting, chart review, review of results, face-to-face care, etc.).

## 2020-04-30 NOTE — Assessment & Plan Note (Signed)
Status post 3 thoracentesis Mainly exudative effusions Most recent being on 04/16/2020  Plan: Chest x-ray today Continue diuretics We will plan on CT imaging in 4 to 6 weeks

## 2020-05-01 ENCOUNTER — Telehealth: Payer: Self-pay | Admitting: Pulmonary Disease

## 2020-05-01 NOTE — Telephone Encounter (Signed)
Melvin Rinne, NP  05/01/2020 12:58 PM EST      Chest x-ray continues to show dense opacity. Discussed case with Dr. Melvyn Novas. Will obtain CT chest without contrast in about 6 weeks. Radiology recommending with IV contrast with patient has decreased kidney functioning and could not tolerate. No new recommendations at this time. If patient symptoms worsen he needs to let us know. Patient should have upcoming follow-up with cardiology as well as nephrology.  Also reviewed case with Dr. Valeta Harms. Does not feel that bronchoscopy is needed at this point in time. Could consider after repeat CT scan.  Melvin Quaker, FNP   Pt is already scheduled to have CT performed 05/30/20.  Attempted to call pt's wife Melvin Mcclure in regards to results of cxr but unable to reach. Left message for her to return call.

## 2020-05-02 NOTE — Telephone Encounter (Signed)
Spoke with the pt's spouse, [name], [place] per DPR  I notified her of results per Aaron Edelman  She verbalized understanding and will inform pt  He will call if any worsening symptoms  She states that he has appt with cards 05/21/20 and neph 06/06/20 Will keep ct appt for 05/30/20

## 2020-05-02 NOTE — Telephone Encounter (Signed)
Pt wife Melvin Mcclure is returning call from office . Please advise

## 2020-05-02 NOTE — Telephone Encounter (Signed)
ATC no voicemail available to leave message. Will attempt to reach at later time.

## 2020-05-08 ENCOUNTER — Ambulatory Visit: Payer: Medicare Other | Admitting: Primary Care

## 2020-05-17 ENCOUNTER — Telehealth: Payer: Self-pay | Admitting: Pulmonary Disease

## 2020-05-17 DIAGNOSIS — G4734 Idiopathic sleep related nonobstructive alveolar hypoventilation: Secondary | ICD-10-CM

## 2020-05-17 NOTE — Telephone Encounter (Signed)
05/17/20  Received patient's overnight oximetry results are listed below:  05/09/2020- overnight oximetry on room air-duration of sleep 6 hours and 7 minutes-time spent below 88% 315 minutes, average oxygenation 85%  Would recommend the patient start 2 L of O2 at night based off this test.  Would also recommend repeat overnight oximetry on 2 L  Please go ahead and place this order  Wyn Quaker, FNP

## 2020-05-18 NOTE — Telephone Encounter (Signed)
I tried to call patient to go over results. Left detailed VM on wife phone per DPR. Left callback number for any questions/concerns.i also put in order for ONO on 2L. Nothing further needed

## 2020-05-21 ENCOUNTER — Ambulatory Visit: Payer: Medicare Other | Admitting: Cardiology

## 2020-05-30 ENCOUNTER — Ambulatory Visit
Admission: RE | Admit: 2020-05-30 | Discharge: 2020-05-30 | Disposition: A | Discharge status: Home / Self Care | Payer: Medicare Other | Source: Ambulatory Visit | Attending: Pulmonary Disease | Admitting: Pulmonary Disease

## 2020-05-30 ENCOUNTER — Telehealth: Payer: Self-pay | Admitting: Internal Medicine

## 2020-05-30 DIAGNOSIS — J90 Pleural effusion, not elsewhere classified: Secondary | ICD-10-CM

## 2020-05-30 DIAGNOSIS — R918 Other nonspecific abnormal finding of lung field: Secondary | ICD-10-CM

## 2020-05-30 NOTE — Telephone Encounter (Signed)
ATC x1  Left vm to return call.

## 2020-05-31 NOTE — Telephone Encounter (Signed)
lmtcb for pt.  

## 2020-06-01 ENCOUNTER — Telehealth: Payer: Medicare Other | Admitting: Cardiology

## 2020-06-05 NOTE — Progress Notes (Signed)
Cardiology Clinic Note   Patient Name: Melvin Mcclure Date of Encounter: 06/07/2020  Primary Care Provider:  Nolene Ebbs, MD Primary Cardiologist:  Kirk Ruths, MD  Patient Profile    Melvin Mcclure 70 year old male presents the clinic today for follow-up evaluation of his acute on chronic diastolic CHF.  Past Medical History    Past Medical History:  Diagnosis Date  . Anemia in chronic kidney disease (CKD)   . CHF (congestive heart failure) ([place]) 05/28/2018   dx 05/28/18  . Diabetes mellitus without complication ([place])   . Hyperlipidemia   . Hypertension   . Iron deficiency anemia   . Perforating neurotrophic ulcer of foot ([place])    Past Surgical History:  Procedure Laterality Date  . ACHILLES TENDON REPAIR    . CYST EXCISION    . IR THORACENTESIS ASP PLEURAL SPACE W/IMG GUIDE  12/26/2019  . IR THORACENTESIS ASP PLEURAL SPACE W/IMG GUIDE  01/18/2020  . THORACENTESIS  04/16/2020    Allergies  No Known Allergies  History of Present Illness    Melvin Mcclure has a PMH of chronic stage IV kidney disease, chronic diastolic CHF, essential hypertension.  Echocardiogram 6/21 showed normal LV function, moderate left ventricular hypertrophy, mild mitral regurgitation, trivial to small pericardial effusion.  Underwent PYP scan 8/21 which was not suggestive of amyloidosis.  He was admitted to the hospital 01/17/2020 until 01/23/2020 with acute on chronic diastolic CHF, acute hypoxic respiratory failure, and recurrent right pleural effusion.  He received IV diuresis and 8.4 l were diuresed.  He was transitioned to torsemide and metolazone to be taken twice weekly as needed.  He was receiving supplemental home O2 to 3 L via nasal cannula.  He was instructed to follow-up with nephrology and cardiology.  It was felt that he would ultimately need HD in the near future.  He was seen virtually 03/01/2020 in follow-up and stated he was feeling somewhat better .  He had been having spells of  vertigo which he managed with meclizine.  He indicated that yesterday he was very dizzy and unable to do much activity.  He stated that overall he was doing much better on torsemide and he did not need to take any metolazone.  His weight had been stable and his blood pressure had remained in the 130s over 70s.  He hoped that he would be able to get back to playing tennis.  He had begun to wean his oxygen.   He was using 2 L of oxygen.  He stated at times he did not wear it but had been wearing it at night and was sleeping consistently.  He met with nephrology 02/29/2020 who indicated they did not feel he would need HD at the time.  I had him continue to increase his physical activity as tolerated, follow a low-sodium heart healthy diet, and planned follow-up in 3 months.  He was admitted to the hospital 04/17/2020 and discharged on 04/21/2020.  He presented to the emergency department with worsening shortness of breath and productive cough.  He underwent a right thoracentesis 04/17/2019 and 2 L of fluid was removed.  Despite this his shortness of breath continued to worsen.  On arrival to the emergency department he was noted to be tachypneic and hypoxic.  His chest x-ray showed notable vascular congestion and mild pulmonary edema, hazy opacity throughout his right thorax suspicious of pleural effusion, suspicious of pneumonia versus atelectasis.  A chest CT without contrast showed right lower lobe infiltrate/consolidation/collapse.  Findings were  felt to be consistent with pneumonia but the possibility of tumor in the right inferior hilar region could not be excluded.  His cytology showed no malignant cells.  He was treated with IV Rocephin and Zithromax for possible community-acquired pneumonia.  He was transitioned to oral antibiotics at discharge and his oxygen was weaned down to 1 L.  He received IV Lasix since was transitioned to oral torsemide.  It was recommended that he follow-up with Dr. Shyrl Numbers in 2-4  weeks to repeat imaging.  His creatinine was stable around 4.1, blood pressure was well managed, and his hemoglobin was 10.8.  He was seen by Dr. Warner Mccreedy on 04/30/2020.  He reported having good and bad days since being discharged from the hospital.  He had occasional dyspnea.  He reported that his weight was 199 and he required 2 L of oxygen.  His appetite was getting back to normal and he reported compliance with his 1500 mL fluid restriction.  He reported he had been weighing himself daily and did not have lower extremity edema.  His diuretics were continued and CT was planned for 4 to 6 weeks with follow-up in 4 weeks.  He presents the clinic today for follow-up evaluation states he feels well.  He presents on room air today.  Oxygen saturation 93%.  He had follow-up chest CT 05/30/2020 which showed persistent pleural effusion.  Was at pulmonologist yesterday who reported they were going to monitor at this time.  He continues to be physically active at home doing housework/yard work, doing stairs.  He is following a low-sodium diet.  He has done some overnight oxygenation trials which showed that his oxygen saturation was dropping into the high 80s.  He repeated the trial with 2 L of oxygen and we are waiting on results.  I will give him the salty 6 diet sheet, have him continue his physical activity, continue his current medication regimen, and have him follow-up in 3 months.  Today he denies chest pain, increased shortness of breath, lower extremity edema, fatigue, palpitations, melena, hematuria, hemoptysis, diaphoresis, weakness, presyncope, syncope, orthopnea, and PND.  Home Medications    Prior to Admission medications   Medication Sig Start Date End Date Taking? Authorizing Provider  amLODipine (NORVASC) 10 MG tablet Take 10 mg by mouth daily.    [provider]  aspirin 81 MG tablet Take 81 mg by mouth daily.    [provider]  blood glucose meter kit and supplies KIT  Dispense based on patient and insurance preference. Use up to four times daily as directed. (FOR ICD-9 250.00, 250.01). 04/21/20   Hosie Poisson, MD  calcitRIOL (ROCALTROL) 0.25 MCG capsule Take 0.25 mcg by mouth daily. Pt takes 1 tablet daily 10/18/19   [provider]  calcium acetate (PHOSLO) 667 MG capsule Take 1 capsule (667 mg total) by mouth 3 (three) times daily with meals. 04/21/20   Hosie Poisson, MD  carvedilol (COREG) 12.5 MG tablet Take 1 tablet (12.5 mg total) by mouth 2 (two) times daily with a meal. 02/16/20   Crenshaw, Denice Bors, MD  diclofenac Sodium (VOLTAREN) 1 % GEL Apply 2 g topically 4 (four) times daily. Patient not taking: Reported on 04/30/2020 12/31/19   Geradine Girt, DO  ferrous sulfate 325 (65 FE) MG tablet Take 325 mg by mouth daily with breakfast.    [provider]  gabapentin (NEURONTIN) 300 MG capsule Take 1 capsule (300 mg total) by mouth 2 (two) times daily. 12/31/19  Eulogio Bear U, DO  hydrALAZINE (APRESOLINE) 50 MG tablet Take 1 tablet (50 mg total) by mouth every 8 (eight) hours. Patient taking differently: Take 50 mg by mouth 3 (three) times daily. Pt takes 1 tablet daily 06/04/18   Dessa Phi, DO  hydrOXYzine (ATARAX/VISTARIL) 25 MG tablet Take 25 mg by mouth 2 (two) times daily as needed for anxiety. Patient not taking: Reported on 04/30/2020 04/13/20   [provider]  isosorbide mononitrate (IMDUR) 60 MG 24 hr tablet Take 1 tablet (60 mg total) by mouth every evening. 02/16/20   Lelon Perla, MD  meclizine (ANTIVERT) 25 MG tablet Take 1 tablet (25 mg total) by mouth 3 (three) times daily. 12/31/19   Geradine Girt, DO  metolazone (ZAROXOLYN) 5 MG tablet Take 1 tablet (5 mg total) by mouth as needed (Twice weekly as needed for shortness of breath, swelling, weight gain). Patient not taking: Reported on 04/30/2020 01/23/20   [place] Eric J, DO  senna-docusate (SENOKOT-S) 8.6-50 MG tablet Take 1 tablet by mouth 2 (two) times  daily as needed for mild constipation.     [provider]  torsemide (DEMADEX) 20 MG tablet Take 2 tablets (40 mg total) by mouth 2 (two) times daily. 01/23/20 04/22/20  [place] Eric J, DO    Family History    Family History  Problem Relation Age of Onset  . Diabetes Mellitus II Mother   . Hypertension Father    He indicated that his mother is deceased. He indicated that his father is alive.  Social History    Social History   Socioeconomic History  . Marital status: Married    Spouse name: Not on file  . Number of children: Not on file  . Years of education: Not on file  . Highest education level: Not on file  Occupational History  . Not on file  Tobacco Use  . Smoking status: Former Research scientist (life sciences)  . Smokeless tobacco: Never Used  . Tobacco comment: smoked for only 1.5 years in the 19's  Vaping Use  . Vaping Use: Never used  Substance and Sexual Activity  . Alcohol use: No    Alcohol/week: 0.0 standard drinks  . Drug use: No  . Sexual activity: Not on file  Other Topics Concern  . Not on file  Social History Narrative  . Not on file   Social Determinants of Health   Financial Resource Strain: Not on file  Food Insecurity: Not on file  Transportation Needs: Not on file  Physical Activity: Not on file  Stress: Not on file  Social Connections: Not on file  Intimate Partner Violence: Not on file     Review of Systems    General:  No chills, fever, night sweats or weight changes.  Cardiovascular:  No chest pain, dyspnea on exertion, edema, orthopnea, palpitations, paroxysmal nocturnal dyspnea. Dermatological: No rash, lesions/masses Respiratory: No cough, dyspnea Urologic: No hematuria, dysuria Abdominal:   No nausea, vomiting, diarrhea, bright red blood per rectum, melena, or hematemesis Neurologic:  No visual changes, wkns, changes in mental status. All other systems reviewed and are otherwise negative except as noted above.  Physical Exam    VS:  BP  110/62 (BP Location: Left Arm, Patient Position: Sitting)   Pulse 70   Ht $R'5\' 10"'uq$  (1.778 m)   Wt 199 lb 3.2 oz (90.4 kg)   SpO2 93%   BMI 28.58 kg/m  , BMI Body mass index is 28.58 kg/m. GEN: Well nourished, well developed, in  no acute distress. HEENT: normal. Neck: Supple, no JVD, carotid bruits, or masses. Cardiac: RRR, no murmurs, rubs, or gallops. No clubbing, cyanosis, edema.  Radials/DP/PT 2+ and equal bilaterally.  Respiratory:  Respirations regular and unlabored, clear to auscultation bilaterally. GI: Soft, nontender, nondistended, BS + x 4. MS: no deformity or atrophy. Skin: warm and dry, no rash. Neuro:  Strength and sensation are intact. Psych: Normal affect.  Accessory Clinical Findings    Recent Labs: 04/17/2020: ALT 12; B Natriuretic Peptide 480.6 04/21/2020: BUN 100; Creatinine, Ser 3.79; Hemoglobin 10.7; Platelets 252; Potassium 4.1; Sodium 140   Recent Lipid Panel No results found for: CHOL, TRIG, HDL, CHOLHDL, VLDL, LDLCALC, LDLDIRECT  ECG personally reviewed by me today-none today.- No acute changes  EKG 01/23/2020 Normal sinus rhythm left atrial enlargement right bundle branch block left anterior fascicular block 70 bpm  Echocardiogram 10/29/2019 IMPRESSIONS    1. Left ventricular ejection fraction, by estimation, is 55 to 60%. The  left ventricle has normal function. The left ventricle has no regional  wall motion abnormalities. There is moderate left ventricular hypertrophy.  Left ventricular diastolic  parameters are indeterminate.  2. Right ventricular systolic function is normal. The right ventricular  size is normal. Tricuspid regurgitation signal is inadequate for assessing  PA pressure.  3. The mitral valve is grossly normal. Mild mitral valve regurgitation.  4. The aortic valve is tricuspid, mildly calcified. Aortic valve  regurgitation is not visualized.  5. The inferior vena cava is normal in size with greater than 50%  respiratory  variability, suggesting right atrial pressure of 3 mmHg.  6. Trivial to small circumferential pericardial effusion.  Nuclear stress test 07/07/2018  The left ventricular ejection fraction is normal (55-65%).  Nuclear stress EF: 57%. No wall motion abnormalities  There was no ST segment deviation noted during stress.  Defect 1: There is a medium defect of mild severity present in the basal inferior location. This appears secondary to diaphragmatic attenuation artifact  This is a low risk study. No significant evidence of ischemia identified.   Assessment & Plan   1.  Acute diastolic CHF- euvolemic today.  Weight today 199 pounds.  Discharge weight 196.4 pounds.  Previously felt that his worsening renal function was major contributor to his fluid volume excess. Continue carvedilol, hydralazine, metolazone (if needed take 30 minutes prior to torsemide), torsemide. Heart healthy low-sodium diet-salty 6 reviewed Daily weights-reviewed instructions for metolazone. Increase physical activity as tolerated  Essential hypertension-BP today 110/62.  Well-controlled at home. Continue amlodipine, Imdur, carvedilol, hydralazine, torsemide, Heart healthy low-sodium diet-salty 6 given Increase physical activity as tolerated  Acute on chronic stage IV CKD-creatinine 4.01 on 01/23/2020.  During last hospitalization it was felt that he would need to be transitioned to HD in the near future. Follows with nephrology.  Persistent moderate sized pleural effusion-no increased DOE.  On room air today.  Reports overnight oxygenation trial on 2 L, awaiting results. Follows with pulmonology  Disposition: Follow-up with Dr. Stanford Breed or me in 3 months.   Jossie Ng. Berlie Persky NP-C    06/07/2020, 9:30 AM [address] Office [phone] Fax [phone]  Notice: This dictation was prepared with Dragon dictation along with smaller phrase technology. Any  transcriptional errors that result from this process are unintentional and may not be corrected upon review.  I spent15 minutes examining this patient, reviewing medications, and using patient centered shared decision making involving her cardiac care.  Prior to her visit I  spent greater than 20 minutes reviewing her past medical history,  medications, and prior cardiac tests.

## 2020-06-06 ENCOUNTER — Ambulatory Visit (INDEPENDENT_AMBULATORY_CARE_PROVIDER_SITE_OTHER): Payer: Medicare Other | Admitting: Internal Medicine

## 2020-06-06 ENCOUNTER — Other Ambulatory Visit: Payer: Self-pay

## 2020-06-06 ENCOUNTER — Encounter: Payer: Self-pay | Admitting: Internal Medicine

## 2020-06-06 DIAGNOSIS — J9611 Chronic respiratory failure with hypoxia: Secondary | ICD-10-CM

## 2020-06-06 DIAGNOSIS — J90 Pleural effusion, not elsewhere classified: Secondary | ICD-10-CM

## 2020-06-06 NOTE — Assessment & Plan Note (Addendum)
R  thoracentesis on 12/26/2019  X 700 cc  Exudative with wbc 1429 and 78% L/ neg cytology  -  R  thoracentesis on 01/18/2020 with 1.75 L removed   W/u to date has been non-specific, will wait until more convincing symptoms and cxr progression then repeat the tap with flow cytometry as well given wide ddx here though most likely is postinflammatory eg parapneumonic   Discussed in detail all the  indications, usual  risks and alternatives  relative to the benefits with patient who agrees to proceed with w/u as outlined.            Each maintenance medication was reviewed in detail including emphasizing most importantly the difference between maintenance and prns and under what circumstances the prns are to be triggered using an action plan format where appropriate.  Total time for H and P, chart review, counseling, reviewing  and generating customized AVS unique to this initial office visit / same day charting  > 45 min

## 2020-06-06 NOTE — Assessment & Plan Note (Addendum)
Desat 05/09/2020- overnight oximetry on room air-duration of sleep 6 hours and 7 minutes-time spent below 88% 315 minutes, average oxygenation 85% rec  start 2 L of O2 at night based off this test> done 06/05/20  Pending -  06/06/2020   Walked RA  2 laps @ approx 276ft each @modpace   stopped due to end of study, min  sob  And sats 93% at end   >>> rec continue 2lpm hs pending repeat on 2lpm   Each maintenance medication was reviewed in detail including emphasizing most importantly the difference between maintenance and prns and under what circumstances the prns are to be triggered using an action plan format where appropriate.  Total time for H and P, chart review, counseling,  directly observing portions of ambulatory 02 saturation study/ and generating customized AVS unique to this office visit / same day charting  > 30 min

## 2020-06-06 NOTE — Assessment & Plan Note (Signed)
Echo 10/29/19 Moderate LVH/ Mild MR R  thoracentesis on 12/26/2019  X 700 cc  Exudative with wbc 1429 and 78% L/ neg cytology  -  R  thoracentesis on 01/18/2020 with 1.75 L removed  -  R T centesis  04/16/20 x  2 liters >>>  Exudate  With nl glucase  wbc 867   80% Lymphs >  Cyt neg/ flow cytometry >>> not done  - CT  04/18/20 1. Right lower lobe infiltrate/consolidation/collapse. Some dependent infiltrate/volume loss in the right upper lobe. Moderate hazy pneumonia in the right middle lobe. Moderate size right effusion layering dependently without evidence of loculation. Findings may be due 2 pneumonia, but the possibility of tumor in the right inferior hilar region is not excluded by this exam. 2. Dependent atelectasis or infiltrate in the left lower lobe. - CT 04/29/21 slt worse effusion / rec contrast but note creatinine   Next step appears to be VATS bx / pleurodesis for what is likely  Post inflammatory effusion assoc with now chronically atx lung,  but presently asymptomatic and non-urgent procedures are being delayed due to covid.  Assured pt that there is no urgency here (even if malignant, this would not be amenable to "early intervention" and I highly doubt it is neoplastic)   >>> Discussed in detail all the  indications, usual  risks and alternatives  relative to the benefits with patient who agrees to proceed with w/u as outlined with cxr

## 2020-06-06 NOTE — Progress Notes (Signed)
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 Follow up with PCP in 1-2 weeks 2. Follow-up with nephrology, Dr. Royce Macadamia as scheduled 3. Follow-up with cardiology as scheduled 4. Discharged on increased torsemide 40 mg p.o. twice daily with metolazone to use twice weekly as needed for increased shortness of breath, edema, or weight gain 5. Discharged on supplemental oxygen 3 L per nasal cannula 6. Please obtain BMP in one week 7. Please follow up on the following pending results:  Home Health: No Equipment/Devices: Oxygen, 3 L per nasal cannula  Discharge Condition: Stable CODE STATUS: Full code Diet recommendation: Renal/low-salt diet  History of present illness:  Melvin Mcclure a 70 year old male with past medical history notable for CKD stage IV, diastolic congestive heart failure, essential hypertension, type 2 diabetes mellitus with recurrent hospitalizations who was sent in from his cardiologist office for shortness of breath after being found to have recurrent right pleural effusion with associated hypoxia. In the ED, SPO2 was noted to be in the 80s on room air, and in the mid 90s on 2 L nasal cannula. Nephrology and cardiology were consulted. TRH consulted for admission.  Hospital course:  Acute hypoxic respiratory failure, present on admission Recurrent right pleural effusion Chronic diastolic congestive heart failure, decompensated Patient presenting from cardiology office with shortness of breath and found to be hypoxic with SPO2 in the low 80s on room air. Patient was noted to have a recurrent right pleural effusion and underwent  thoracentesis on 01/18/2020 with 1.75 L removed . Volume overload is complicated by his diastolic heart dysfunction as well as his severe kidney disease.  Nephrology and cardiology were consulted and followed during hospital course.  Patient was started on aggressive IV diuresis with furosemide with good output and was net negative 8.4 L during the hospitalization.  Patient was then transitioned to torsemide at a higher dose of 40 mg p.o. twice daily; which he will continue outpatient in addition to metolazone 5 mg p.o. twice weekly as needed for increased shortness of breath, weight gain, edema.  Patient will need home supplemental oxygen at 3 L per nasal cannula with exertion.  Will need close outpatient follow-up with both cardiology and nephrology following discharge.  Ultimately, patient will likely need hemodialysis in the near future given his recurrent hospitalizations and his difficulty managing his overall volume with diuretics given his severe renal insufficiency.  Acute renal failure on CKD stage IV Nephrology following as above Bridge assistance, follows outpatient with Dr. Royce Macadamia.  Increased home torsemide to 40 mg p.o. twice daily with occasional twice weekly metolazone as above.  Given his recurrent hospitalizations as above with difficulty managing his volume overload with his renal insufficiency, likely will need HD in the near future.  Outpatient follow-up with Dr. Royce Macadamia.  Type 2 diabetes mellitus Hemoglobin A1c 6.6 on 12/29/2019, well controlled. Diet controlled at home.  Essential hypertension Continue home amlodipine 10 mg p.o. daily, Carvedilol 12.5 mg p.o. twice daily, Hydralazine 50 mg p.o. every 8 hours.  Continue aspirin.  Not on statin at home.  Follow-up with cardiology.  Discharge Diagnoses:  Principal Problem:   Acute respiratory  failure with hypoxia (HCC) Active Problems:   Acute diastolic CHF (congestive heart failure) (HCC)   DM2 (diabetes mellitus, type 2)  ([place])   CKD stage 4 due to type 2 diabetes mellitus ([place])   Hypertensive cardiomyopathy, with heart failure (HCC)   Recurrent right pleural effusion   HTN (hypertension)      History of Present Illness  04/12/2020  Pulmonary/ 1st office eval/Jack Mineau  Chief Complaint  Patient presents with  . Consult    Shortness of breath at rest at around bedtime  Dyspnea:  Room to room  Cough: none  Sleep: bed is flat wedge x about 30 degrees  SABA use:  02 back on 2-4 lpm p 12/4 started needing 02 and zeroxyln x 2 and better on day of ov rec Re-tap prn     06/06/2020  f/u ov/Travanti Mcmanus re: unexplained pleural effusion/ played with asbestos HK Starleen Blue  Chief Complaint  Patient presents with  . Follow-up  Dyspnea:  Not limited by breathing from desired activities   Cough: none now Sleeping: bed is now flat / one-two  pillow SABA use: inhalers 02: not needing - just repeated on 2lpm       No obvious day to day or daytime variability or assoc excess/ purulent sputum or mucus plugs or hemoptysis or cp or chest tightness, subjective wheeze or overt sinus or hb symptoms.   Sleeping now almost flat without nocturnal  or early am exacerbation  of respiratory  c/o's or need for noct saba. Also denies any obvious fluctuation of symptoms with weather or environmental changes or other aggravating or alleviating factors except as outlined above   No unusual exposure hx or h/o childhood pna/ asthma or knowledge of premature birth.  Current Allergies, Complete Past Medical History, Past Surgical History, Family History, and Social History were reviewed in [employer] record.  ROS  The following are not active complaints unless bolded Hoarseness, sore throat, dysphagia, dental problems, itching, sneezing,  nasal congestion or discharge of excess mucus or purulent secretions, ear ache,   fever, chills, sweats, unintended wt loss or wt gain, classically pleuritic or exertional cp,  orthopnea  pnd or arm/hand swelling  or leg swelling, presyncope, palpitations, abdominal pain, anorexia, nausea, vomiting, diarrhea  or change in bowel habits or change in bladder habits, change in stools or change in urine, dysuria, hematuria,  rash, arthralgias, visual complaints, headache, numbness, weakness or ataxia or problems with walking or coordination,  change in mood or  memory.        Current Meds  Medication Sig  . amLODipine (NORVASC) 10 MG tablet Take 10 mg by mouth daily.  Marland Kitchen aspirin 81 MG tablet Take 81 mg by mouth daily.  . blood glucose meter kit and supplies KIT Dispense based on patient and insurance preference. Use up to four times daily as directed. (FOR ICD-9 250.00, 250.01).  . calcitRIOL (ROCALTROL) 0.25 MCG capsule Take 0.25 mcg by mouth daily. Pt takes 1 tablet daily  . calcium acetate (PHOSLO) 667 MG capsule Take 1 capsule (667 mg total) by mouth 3 (three) times daily with meals.  . carvedilol (COREG) 12.5 MG tablet Take 1 tablet (12.5 mg total) by mouth 2 (two) times daily with a meal.  . diclofenac Sodium (VOLTAREN) 1 % GEL Apply 2 g topically 4 (four) times daily.  . ferrous sulfate 325 (65 FE) MG tablet Take 325 mg by mouth daily with breakfast.  . gabapentin (NEURONTIN) 300 MG capsule Take 1 capsule (  300 mg total) by mouth 2 (two) times daily.  . hydrALAZINE (APRESOLINE) 50 MG tablet Take 1 tablet (50 mg total) by mouth every 8 (eight) hours. (Patient taking differently: Take 50 mg by mouth 3 (three) times daily. Pt takes 1 tablet daily)  . hydrOXYzine (ATARAX/VISTARIL) 25 MG tablet Take 25 mg by mouth 2 (two) times daily as needed for anxiety.  . isosorbide mononitrate (IMDUR) 60 MG 24 hr tablet Take 1 tablet (60 mg total) by mouth every evening.  . meclizine (ANTIVERT) 25 MG tablet Take 1 tablet (25 mg total) by mouth 3 (three) times daily.  . metolazone (ZAROXOLYN) 5 MG tablet Take 1 tablet (5 mg total) by mouth as needed (Twice weekly as needed for shortness of breath,  swelling, weight gain). (Patient taking differently: Take 5 mg by mouth as directed.)  . senna-docusate (SENOKOT-S) 8.6-50 MG tablet Take 1 tablet by mouth 2 (two) times daily as needed for mild constipation.   . torsemide (DEMADEX) 20 MG tablet Take 40 mg by mouth 2 (two) times daily.               Past Medical History:  Diagnosis Date  . Anemia in chronic kidney disease (CKD)   . CHF (congestive heart failure) ([place]) 05/28/2018   dx 05/28/18  . Diabetes mellitus without complication ([place])   . Hyperlipidemia   . Hypertension   . Iron deficiency anemia   . Perforating neurotrophic ulcer of foot ([place])            Objective:     Wt Readings from Last 3 Encounters:  06/06/20 201 lb (91.2 kg)  04/30/20 199 lb 12.8 oz (90.6 kg)  04/21/20 198 lb 12.8 oz (90.2 kg)      Vital signs reviewed  06/06/2020  - Note at rest 02 sats  94% on RA   General appearance:    amb pleasant bm nad      HEENT : pt wearing mask not removed for exam due to covid -19 concerns.    NECK :  without JVD/Nodes/TM/ nl carotid upstrokes bilaterally   LUNGS: no acc muscle use,  Nl contour chest with decreased bs/ dullness 1/4 on R  without cough on insp or exp maneuvers   CV:  RRR  no s3 or murmur or increase in P2, and no edema   ABD:  soft and nontender with nl inspiratory excursion in the supine position. No bruits or organomegaly appreciated, bowel sounds nl  MS:  Nl gait/ ext warm without deformities, calf tenderness, cyanosis or clubbing No obvious joint restrictions   SKIN: warm and dry without lesions    NEURO:  alert, approp, nl sensorium with  no motor or cerebellar deficits apparent.         I personally reviewed images and agree with radiology impression as follows:   Chest CT 05/30/20 1. Persistent moderate-sized right-sided pleural effusion, increased in size from prior study. 2. Persistent partial consolidation/collapse of the right lower lobe without a clear identifiable cause.  An underlying mass is difficult to exclude in the collapsed lung, especially in the absence of IV Contrast.   Lab Results  Component Value Date   CREATININE 3.79 (H) 04/21/2020   CREATININE 3.92 (H) 04/20/2020   CREATININE 4.14 (H) 04/19/2020        Assessment

## 2020-06-06 NOTE — Patient Instructions (Addendum)
No change in medications  Continue 0xygen at bedtime for now and we'll call the results of the overnight study on    Please schedule a follow up office visit in 6 weeks, call sooner if needed with cxr  Add :  Needs Quant gold tb/ esr, ana, RA latex on return to be complete

## 2020-06-07 ENCOUNTER — Encounter: Payer: Self-pay | Admitting: General Practice

## 2020-06-07 ENCOUNTER — Ambulatory Visit (INDEPENDENT_AMBULATORY_CARE_PROVIDER_SITE_OTHER): Payer: Medicare Other | Admitting: General Practice

## 2020-06-07 VITALS — BP 110/62 | HR 70 | Ht 70.0 in | Wt 199.2 lb

## 2020-06-07 DIAGNOSIS — I5031 Acute diastolic (congestive) heart failure: Secondary | ICD-10-CM | POA: Diagnosis not present

## 2020-06-07 DIAGNOSIS — N184 Chronic kidney disease, stage 4 (severe): Secondary | ICD-10-CM

## 2020-06-07 DIAGNOSIS — E1122 Type 2 diabetes mellitus with diabetic chronic kidney disease: Secondary | ICD-10-CM | POA: Diagnosis not present

## 2020-06-07 DIAGNOSIS — I10 Essential (primary) hypertension: Secondary | ICD-10-CM | POA: Diagnosis not present

## 2020-06-07 NOTE — Patient Instructions (Signed)
Medication Instructions:  The current medical regimen is effective;  continue present plan and medications as directed. Please refer to the Current Medication list given to you today.  *If you need a refill on your cardiac medications before your next appointment, please call your pharmacy*  Lab Work:   Testing/Procedures:  NONE    NONE  Special Instructions TAKE AND LOG WEIGHT DAILY AND BLOOD PRESSURE EVER-OTHER-DAY  PLEASE READ AND FOLLOW SALTY 6-ATTACHED-1,800mg  daily  PLEASE MAINTAIN PHYSICAL ACTIVITY AS TOLERATED  Follow-Up: Your next appointment:  3 month(s) In Person with Kirk Ruths, MD OR IF UNAVAILABLE [place], FNP-C  At [facility], you and your health needs are our priority.  As part of our continuing mission to provide you with exceptional heart care, we have created designated Provider Care Teams.  These Care Teams include your primary Cardiologist (physician) and Advanced Practice Providers (APPs -  Physician Assistants and Nurse Practitioners) who all work together to provide you with the care you need, when you need it.            6 SALTY THINGS TO AVOID     1,800MG  DAILY

## 2020-06-12 NOTE — Telephone Encounter (Signed)
Two unsuccessful calls have been made to pt without a return call, encounter will be closed per triage protocol.

## 2020-07-19 ENCOUNTER — Other Ambulatory Visit: Payer: Self-pay | Admitting: Internal Medicine

## 2020-07-19 DIAGNOSIS — J90 Pleural effusion, not elsewhere classified: Secondary | ICD-10-CM

## 2020-07-20 ENCOUNTER — Ambulatory Visit (INDEPENDENT_AMBULATORY_CARE_PROVIDER_SITE_OTHER): Payer: Medicare Other | Admitting: Internal Medicine

## 2020-07-20 ENCOUNTER — Encounter: Payer: Self-pay | Admitting: Internal Medicine

## 2020-07-20 ENCOUNTER — Ambulatory Visit (INDEPENDENT_AMBULATORY_CARE_PROVIDER_SITE_OTHER): Payer: Medicare Other

## 2020-07-20 ENCOUNTER — Other Ambulatory Visit: Payer: Self-pay

## 2020-07-20 DIAGNOSIS — J90 Pleural effusion, not elsewhere classified: Secondary | ICD-10-CM

## 2020-07-20 DIAGNOSIS — J9611 Chronic respiratory failure with hypoxia: Secondary | ICD-10-CM | POA: Diagnosis not present

## 2020-07-20 LAB — SEDIMENTATION RATE: Sed Rate: 51 mm/hr — ABNORMAL HIGH (ref 0–20)

## 2020-07-20 NOTE — Progress Notes (Signed)
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 Follow up with PCP in 1-2 weeks 2. Follow-up with nephrology, Dr. Royce Macadamia as scheduled 3. Follow-up with cardiology as scheduled 4. Discharged on increased torsemide 40 mg p.o. twice daily with metolazone to use twice weekly as needed for increased shortness of breath, edema, or weight gain 5. Discharged on supplemental oxygen 3 L per nasal cannula 6. Please obtain BMP in one week 7. Please follow up on the following pending results:  Home Health: No Equipment/Devices: Oxygen, 3 L per nasal cannula  Discharge Condition: Stable CODE STATUS: Full code Diet recommendation: Renal/low-salt diet  History of present illness:  Melvin Mcclure a 70 year old male with past medical history notable for CKD stage IV, diastolic congestive heart failure, essential hypertension, type 2 diabetes mellitus with recurrent hospitalizations who was sent in from his cardiologist office for shortness of breath after being found to have recurrent right pleural effusion with associated hypoxia. In the ED, SPO2 was noted to be in the 80s on room air, and in the mid 90s on 2 L nasal cannula. Nephrology and cardiology were consulted. TRH consulted for admission.  Hospital course:  Acute hypoxic respiratory failure, present on admission Recurrent right pleural effusion Chronic diastolic congestive heart failure, decompensated Patient presenting from cardiology office with shortness of breath and found to be hypoxic with SPO2 in the low 80s on room air. Patient was noted to have a recurrent right pleural effusion and underwent  thoracentesis on 01/18/2020 with 1.75 L removed . Volume overload is complicated by his diastolic heart dysfunction as well as his severe kidney disease.  Nephrology and cardiology were consulted and followed during hospital course.  Patient was started on aggressive IV diuresis with furosemide with good output and was net negative 8.4 L during the hospitalization.  Patient was then transitioned to torsemide at a higher dose of 40 mg p.o. twice daily; which he will continue outpatient in addition to metolazone 5 mg p.o. twice weekly as needed for increased shortness of breath, weight gain, edema.  Patient will need home supplemental oxygen at 3 L per nasal cannula with exertion.  Will need close outpatient follow-up with both cardiology and nephrology following discharge.  Ultimately, patient will likely need hemodialysis in the near future given his recurrent hospitalizations and his difficulty managing his overall volume with diuretics given his severe renal insufficiency.  Acute renal failure on CKD stage IV Nephrology following as above Bridge assistance, follows outpatient with Dr. Royce Macadamia.  Increased home torsemide to 40 mg p.o. twice daily with occasional twice weekly metolazone as above.  Given his recurrent hospitalizations as above with difficulty managing his volume overload with his renal insufficiency, likely will need HD in the near future.  Outpatient follow-up with Dr. Royce Macadamia.  Type 2 diabetes mellitus Hemoglobin A1c 6.6 on 12/29/2019, well controlled. Diet controlled at home.  Essential hypertension Continue home amlodipine 10 mg p.o. daily, Carvedilol 12.5 mg p.o. twice daily, Hydralazine 50 mg p.o. every 8 hours.  Continue aspirin.  Not on statin at home.  Follow-up with cardiology.  Discharge Diagnoses:  Principal Problem:   Acute respiratory  failure with hypoxia (Melvin Mcclure) Active Problems:   Acute diastolic CHF (congestive heart failure) (Melvin Mcclure)   DM2 (diabetes mellitus, type 2)  (Melvin Mcclure)   CKD stage 4 due to type 2 diabetes mellitus (Melvin Mcclure)   Hypertensive cardiomyopathy, with heart failure (Melvin Mcclure)   Recurrent right pleural effusion   HTN (hypertension)      History of Present Illness  04/12/2020  Pulmonary/ 1st office eval/Melvin Mcclure  Chief Complaint  Patient presents with  . Consult    Shortness of breath at rest at around bedtime  Dyspnea:  Room to room  Cough: none  Sleep: bed is flat wedge x about 30 degrees  SABA use:  02 back on 2-4 lpm p 12/4 started needing 02 and zeroxyln x 2 and better on day of ov rec Re-tap prn     06/06/2020  f/u ov/Melvin Mcclure re: unexplained pleural effusion/ played with asbestos HK Starleen Blue  Chief Complaint  Patient presents with  . Follow-up  Dyspnea:  Not limited by breathing from desired activities   Cough: none now Sleeping: bed is now flat / one-two  pillow SABA use: inhalers 02: not needing - just repeated on 2lpm  rec No change in medications Continue 0xygen at bedtime for now and we'll call the results of the overnight study   Please schedule a follow up office visit in 6 weeks, call sooner if needed with cxr      07/20/2020  f/u ov/Melvin Mcclure re: f/u effusion  Chief Complaint  Patient presents with  . Follow-up    CXR done. Breathing is overall doing.    Dyspnea:  Not limited by breathing from desired activities   Cough: none  Sleeping: bed is flat / two pillows SABA use: none 02:  05/09/20 desat >  2lpm [employer] ono since 06/06/20  Covid status:  vax 3    No obvious day to day or daytime variability or assoc excess/ purulent sputum or mucus plugs or hemoptysis or cp or chest tightness, subjective wheeze or overt sinus or hb symptoms.   Sleeping flat without nocturnal  or early am exacerbation  of respiratory  c/o's or need for noct saba. Also denies any obvious fluctuation of symptoms with weather or environmental changes or other aggravating or alleviating factors except as outlined above   No unusual exposure hx or h/o  childhood pna/ asthma or knowledge of premature birth.  Current Allergies, Complete Past Medical History, Past Surgical History, Family History, and Social History were reviewed in [employer] record.  ROS  The following are not active complaints unless bolded Hoarseness, sore throat, dysphagia, dental problems, itching, sneezing,  nasal congestion or discharge of excess mucus or purulent secretions, ear ache,   fever, chills, sweats, unintended wt loss or wt gain, classically pleuritic or exertional cp,  orthopnea pnd or arm/hand swelling  or leg swelling, presyncope, palpitations, abdominal pain, anorexia, nausea, vomiting, diarrhea  or change in bowel habits or change in bladder habits, change in stools or change in urine, dysuria, hematuria,  rash, arthralgias, visual complaints, headache, numbness, weakness or ataxia or problems with walking or coordination,  change in mood or  memory.        Current Meds  Medication Sig  . amLODipine (NORVASC) 10 MG tablet Take 10 mg by mouth daily.  Marland Kitchen aspirin 81 MG tablet Take 81 mg by mouth daily.  . blood glucose meter kit and supplies KIT Dispense based on patient and insurance preference. Use up to four times daily as directed. (  FOR ICD-9 250.00, 250.01).  . calcitRIOL (ROCALTROL) 0.25 MCG capsule Take 0.25 mcg by mouth daily. Pt takes 1 tablet daily  . calcium acetate (PHOSLO) 667 MG capsule Take 1 capsule (667 mg total) by mouth 3 (three) times daily with meals.  . carvedilol (COREG) 12.5 MG tablet Take 1 tablet (12.5 mg total) by mouth 2 (two) times daily with a meal.  . diclofenac Sodium (VOLTAREN) 1 % GEL Apply 2 g topically 4 (four) times daily.  . ferrous sulfate 325 (65 FE) MG tablet Take 325 mg by mouth daily with breakfast.  . gabapentin (NEURONTIN) 300 MG capsule Take 1 capsule (300 mg total) by mouth 2 (two) times daily.  . hydrALAZINE (APRESOLINE) 50 MG tablet Take 1 tablet (50 mg total) by mouth every 8 (eight)  hours. (Patient taking differently: Take 50 mg by mouth 3 (three) times daily. Pt takes 1 tablet daily)  . hydrOXYzine (ATARAX/VISTARIL) 25 MG tablet Take 25 mg by mouth 2 (two) times daily as needed for anxiety.  . isosorbide mononitrate (IMDUR) 60 MG 24 hr tablet Take 1 tablet (60 mg total) by mouth every evening.  . meclizine (ANTIVERT) 25 MG tablet Take 1 tablet (25 mg total) by mouth 3 (three) times daily.  . metolazone (ZAROXOLYN) 5 MG tablet 5 mg once weekly and as needed  . senna-docusate (SENOKOT-S) 8.6-50 MG tablet Take 1 tablet by mouth 2 (two) times daily as needed for mild constipation.   . torsemide (DEMADEX) 20 MG tablet Take 40 mg by mouth 2 (two) times daily.  . [DISCONTINUED] metolazone (ZAROXOLYN) 5 MG tablet Take 1 tablet (5 mg total) by mouth as needed (Twice weekly as needed for shortness of breath, swelling, weight gain). (Patient taking differently: Take 5 mg by mouth as directed.)              .                   Past Medical History:  Diagnosis Date  . Anemia in chronic kidney disease (CKD)   . CHF (congestive heart failure) ([place]) 05/28/2018   dx 05/28/18  . Diabetes mellitus without complication ([place])   . Hyperlipidemia   . Hypertension   . Iron deficiency anemia   . Perforating neurotrophic ulcer of foot ([place])            Objective:     Wt Readings from Last 3 Encounters:  06/06/20 201 lb (91.2 kg)  04/30/20 199 lb 12.8 oz (90.6 kg)  04/21/20 198 lb 12.8 oz (90.2 kg)    Vital signs reviewed  07/20/2020  - Note at rest 02 sats  98% on RA   General appearance:    Stoic amb bm nad       HEENT : pt wearing mask not removed for exam due to covid -19 concerns.    NECK :  without JVD/Nodes/TM/ nl carotid upstrokes bilaterally   LUNGS: no acc muscle use,  Nl contour chest  With decreased BS/ dullness R base  without cough on insp or exp maneuvers   CV:  RRR  no s3 or murmur or increase in P2, and no edema   ABD:  soft and nontender with  nl inspiratory excursion in the supine position. No bruits or organomegaly appreciated, bowel sounds nl  MS:  Nl gait/ ext warm without deformities, calf tenderness, cyanosis or clubbing No obvious joint restrictions   SKIN: warm and dry without lesions    NEURO:  alert, approp,  nl sensorium with  no motor or cerebellar deficits apparent.     CXR PA and Lateral:   07/20/2020 :    I personally reviewed images and agree with radiology impression as follows:     Improved right pleural effusion from prior imaging    Lab Results  Component Value Date   ESRSEDRATE 51 (H) 07/20/2020    Labs ordered 07/20/2020  : Quant TB neg/ collagen vasc screen neg             Assessment

## 2020-07-20 NOTE — Patient Instructions (Signed)
We will check your report on the  overnight 02 saturations while on 2lpm   Please remember to go to the lab department   for your tests - we will call you with the results when they are available.      Please schedule a follow up visit in 3 months but call sooner if needed

## 2020-07-23 LAB — QUANTIFERON-TB GOLD PLUS
Mitogen-NIL: >10 IU/mL
NIL: 0.02 IU/mL
QuantiFERON-TB Gold Plus: NEGATIVE
TB1-NIL: 0.02 IU/mL
TB2-NIL: 0.01 IU/mL

## 2020-07-23 LAB — RHEUMATOID FACTOR: Rheumatoid fact SerPl-aCnc: <14 IU/mL (ref <14)

## 2020-07-23 LAB — ANA: Anti Nuclear Antibody (ANA): NEGATIVE

## 2020-07-24 ENCOUNTER — Encounter: Payer: Self-pay | Admitting: Internal Medicine

## 2020-07-24 ENCOUNTER — Encounter: Payer: Self-pay | Admitting: *Deleted

## 2020-07-24 ENCOUNTER — Telehealth: Payer: Self-pay | Admitting: Internal Medicine

## 2020-07-24 NOTE — Assessment & Plan Note (Signed)
R  thoracentesis on 12/26/2019  X 700 cc  Exudative with wbc 1429 and 78% L/ neg cytology  -  R  thoracentesis on 01/18/2020 with 1.75 L removed  -  R T centesis  04/16/20 x  2 liters >>>  Exudate  With nl glucase  wbc 867   80% Lymphs >  Cyt neg/ flow cytometry >>> not done  - CT  04/18/20 1. Right lower lobe infiltrate/consolidation/collapse. Some dependent infiltrate/volume loss in the right upper lobe. Moderate hazy pneumonia in the right middle lobe. Moderate size right effusion layering dependently without evidence of loculation. Findings may be due 2 pneumonia, but the possibility of tumor in the right inferior hilar region is not excluded by this exam. 2. Dependent atelectasis or infiltrate in the left lower lobe. - CT 04/29/21 slt worse effusion - cxr 07/20/2020 improved but esr 51   Quant TB neg/ collagen vasc screen neg      C/w late parapneumonic process slowly resolving > f/u in 3 m  Discussed in detail all the  indications, usual  risks and alternatives  relative to the benefits with patient who agrees to proceed with conservative f/u as outlined           Each maintenance medication was reviewed in detail including emphasizing most importantly the difference between maintenance and prns and under what circumstances the prns are to be triggered using an action plan format where appropriate.  Total time for H and P, chart review, counseling,  and generating customized AVS unique to this office visit / same day charting =  28 min

## 2020-07-24 NOTE — Telephone Encounter (Signed)
Called [employer] for results of ONO on 2lpm  Report that was received dated 06/05/20 indicates this was done on RA  Need to confirm with the pt whether he was using o2 or not during test [facility] for the pt and once I confirm this will let MW review

## 2020-07-24 NOTE — Assessment & Plan Note (Signed)
Desat 05/09/2020- overnight oximetry on room air-duration of sleep 6 hours and 7 minutes-time spent below 88% 315 minutes, average oxygenation 85% rec  start 2 L of O2 at night based off this test > done 06/05/20  -  06/06/2020   Walked RA  2 laps @ approx 278ft each @[employer]pace   stopped due to end of study, min  sob  And sats 93% at end  - ONO on 2lpm pending as of 07/20/2020   Continue 2lpm hs

## 2020-07-27 NOTE — Telephone Encounter (Signed)
Spoke with the pt's spouse ok per DPR  She states that the pt's last ono was done on 2lpm  Will place in Dr [employer] lookat

## 2020-08-08 ENCOUNTER — Other Ambulatory Visit: Payer: Self-pay | Admitting: Otolaryngology

## 2020-08-08 DIAGNOSIS — R42 Dizziness and giddiness: Secondary | ICD-10-CM

## 2020-08-18 ENCOUNTER — Other Ambulatory Visit: Payer: Self-pay

## 2020-08-18 ENCOUNTER — Ambulatory Visit
Admission: RE | Admit: 2020-08-18 | Discharge: 2020-08-18 | Disposition: A | Discharge status: Home / Self Care | Payer: Medicare Other | Source: Ambulatory Visit | Attending: Otolaryngology | Admitting: Otolaryngology

## 2020-08-18 DIAGNOSIS — R42 Dizziness and giddiness: Secondary | ICD-10-CM

## 2020-08-27 NOTE — Progress Notes (Signed)
HPI: Follow-up chronic diastolic congestive heart failure.  Also with chronic stage IV kidney disease.  Nuclear study March 2020 showed ejection fraction 57%, diaphragmatic attenuation but no ischemia.  Echocardiogram June 2021 showed ejection fraction 55 to 60%, moderate left ventricular hypertrophy, mild mitral regurgitation and small pericardial effusion.  PYP scan August 2021 not suggestive of transthyretin amyloidosis.  Renal Dopplers August 2021 showed no renal artery stenosis.  Chest CT January 2022 showed right pleural effusion and consolidation collapse of right lower lobe.  Since last seen patient denies dyspnea on exertion, orthopnea, PND, pedal edema, exertional chest pain or syncope.  Current Outpatient Medications  Medication Sig Dispense Refill  . amLODipine (NORVASC) 10 MG tablet Take 10 mg by mouth daily.    Marland Kitchen aspirin 81 MG tablet Take 81 mg by mouth daily.    . blood glucose meter kit and supplies KIT Dispense based on patient and insurance preference. Use up to four times daily as directed. (FOR ICD-9 250.00, 250.01). 1 each 0  . calcitRIOL (ROCALTROL) 0.25 MCG capsule Take 0.25 mcg by mouth daily. Pt takes 1 tablet daily    . calcium acetate (PHOSLO) 667 MG capsule Take 1 capsule (667 mg total) by mouth 3 (three) times daily with meals. 90 capsule 1  . carvedilol (COREG) 12.5 MG tablet Take 1 tablet (12.5 mg total) by mouth 2 (two) times daily with a meal. 180 tablet 3  . diclofenac Sodium (VOLTAREN) 1 % GEL Apply 2 g topically 4 (four) times daily.    . ferrous sulfate 325 (65 FE) MG tablet Take 325 mg by mouth daily with breakfast.    . gabapentin (NEURONTIN) 300 MG capsule Take 1 capsule (300 mg total) by mouth 2 (two) times daily. 60 capsule 0  . hydrALAZINE (APRESOLINE) 50 MG tablet Take 1 tablet (50 mg total) by mouth every 8 (eight) hours. (Patient taking differently: Take 50 mg by mouth 3 (three) times daily. Pt takes 1 tablet daily) 90 tablet 0  . hydrOXYzine  (ATARAX/VISTARIL) 25 MG tablet Take 25 mg by mouth 2 (two) times daily as needed for anxiety.    . insulin glargine (LANTUS SOLOSTAR) 100 UNIT/ML Solostar Pen Inject into the skin.    Marland Kitchen isosorbide mononitrate (IMDUR) 60 MG 24 hr tablet Take 1 tablet (60 mg total) by mouth every evening. 90 tablet 3  . meclizine (ANTIVERT) 25 MG tablet Take 1 tablet (25 mg total) by mouth 3 (three) times daily. 30 tablet 0  . metolazone (ZAROXOLYN) 5 MG tablet 5 mg once weekly and as needed    . senna-docusate (SENOKOT-S) 8.6-50 MG tablet Take 1 tablet by mouth 2 (two) times daily as needed for mild constipation.     . torsemide (DEMADEX) 20 MG tablet Take 40 mg by mouth 2 (two) times daily.     No current facility-administered medications for this visit.     Past Medical History:  Diagnosis Date  . Anemia in chronic kidney disease (CKD)   . CHF (congestive heart failure) ([place]) 05/28/2018   dx 05/28/18  . Diabetes mellitus without complication ([place])   . Hyperlipidemia   . Hypertension   . Iron deficiency anemia   . Perforating neurotrophic ulcer of foot ([place])     Past Surgical History:  Procedure Laterality Date  . ACHILLES TENDON REPAIR    . CYST EXCISION    . IR THORACENTESIS ASP PLEURAL SPACE W/IMG GUIDE  12/26/2019  . IR THORACENTESIS ASP PLEURAL SPACE W/IMG  GUIDE  01/18/2020  . THORACENTESIS  04/16/2020    Social History   Socioeconomic History  . Marital status: Married    Spouse name: Not on file  . Number of children: Not on file  . Years of education: Not on file  . Highest education level: Not on file  Occupational History  . Not on file  Tobacco Use  . Smoking status: Former Smoker  . Smokeless tobacco: Never Used  . Tobacco comment: smoked for only 1.5 years in the 70's  Vaping Use  . Vaping Use: Never used  Substance and Sexual Activity  . Alcohol use: No    Alcohol/week: 0.0 standard drinks  . Drug use: No  . Sexual activity: Not on file  Other Topics Concern  . Not  on file  Social History Narrative  . Not on file   Social Determinants of Health   Financial Resource Strain: Not on file  Food Insecurity: Not on file  Transportation Needs: Not on file  Physical Activity: Not on file  Stress: Not on file  Social Connections: Not on file  Intimate Partner Violence: Not on file    Family History  Problem Relation Age of Onset  . Diabetes Mellitus II Mother   . Hypertension Father     ROS: Problems with vertigo but no fevers or chills, productive cough, hemoptysis, dysphasia, odynophagia, melena, hematochezia, dysuria, hematuria, rash, seizure activity, orthopnea, PND, pedal edema, claudication. Remaining systems are negative.  Physical Exam: Well-developed well-nourished in no acute distress.  Skin is warm and dry.  HEENT is normal.  Neck is supple.  Chest diminished breath sounds right lower lobe Cardiovascular exam is regular rate and rhythm.  Abdominal exam nontender or distended. No masses palpated. Extremities show no edema. neuro grossly intact  A/P  1 chronic diastolic congestive heart failure-patient is euvolemic on examination.  Continue Demadex and metolazone at present dose.  Renal function is monitored by nephrology.  2 hypertension-blood pressure is controlled.  Continue present medications.  3 chronic stage IV kidney disease-follow-up with nephrology.   , MD    

## 2020-09-03 ENCOUNTER — Ambulatory Visit: Payer: Medicare Other | Attending: Family

## 2020-09-03 DIAGNOSIS — Z23 Encounter for immunization: Secondary | ICD-10-CM

## 2020-09-04 ENCOUNTER — Ambulatory Visit: Payer: Medicare Other | Admitting: Cardiology

## 2020-09-04 ENCOUNTER — Ambulatory Visit (INDEPENDENT_AMBULATORY_CARE_PROVIDER_SITE_OTHER): Payer: Medicare Other | Admitting: Cardiology

## 2020-09-04 ENCOUNTER — Other Ambulatory Visit: Payer: Self-pay

## 2020-09-04 ENCOUNTER — Encounter: Payer: Self-pay | Admitting: Cardiology

## 2020-09-04 VITALS — BP 136/78 | HR 69 | Ht 70.0 in | Wt 207.0 lb

## 2020-09-04 DIAGNOSIS — I5031 Acute diastolic (congestive) heart failure: Secondary | ICD-10-CM

## 2020-09-04 DIAGNOSIS — I10 Essential (primary) hypertension: Secondary | ICD-10-CM

## 2020-09-04 DIAGNOSIS — E1122 Type 2 diabetes mellitus with diabetic chronic kidney disease: Secondary | ICD-10-CM | POA: Diagnosis not present

## 2020-09-04 DIAGNOSIS — N184 Chronic kidney disease, stage 4 (severe): Secondary | ICD-10-CM

## 2020-09-04 NOTE — Patient Instructions (Signed)

## 2020-10-12 NOTE — Progress Notes (Signed)
   Covid-19 Vaccination Clinic  Name:  Melvin Mcclure    MRN: [id number] DOB: [birth year]  10/12/2020  [name] was observed post Covid-19 immunization for 15 minutes without incident. He was provided with Vaccine Information Sheet and instruction to access the V-Safe system.   [name] was instructed to call 911 with any severe reactions post vaccine: Difficulty breathing  Swelling of face and throat  A fast heartbeat  A bad rash all over body  Dizziness and weakness   Immunizations Administered     Name Date Dose VIS Date Route   Moderna Covid-19 Booster Vaccine 09/03/2020  9:00 AM 0.25 mL 02/22/2020 Intramuscular   Manufacturer: Moderna   Lot: [id number]   [place]: [id number]

## 2020-10-22 ENCOUNTER — Ambulatory Visit: Payer: Medicare Other | Admitting: Internal Medicine

## 2020-11-29 ENCOUNTER — Other Ambulatory Visit: Payer: Self-pay | Admitting: Internal Medicine

## 2020-11-30 LAB — LIPID PANEL
Cholesterol: 178 mg/dL (ref <200)
HDL: 35 mg/dL — ABNORMAL LOW (ref >=40)
LDL Cholesterol (Calc): 118 mg/dL (calc) — ABNORMAL HIGH
Non-HDL Cholesterol (Calc): 143 mg/dL (calc) — ABNORMAL HIGH (ref <130)
Total CHOL/HDL Ratio: 5.1 (calc) — ABNORMAL HIGH (ref <5.0)
Triglycerides: 130 mg/dL (ref <150)

## 2020-11-30 LAB — COMPLETE METABOLIC PANEL WITH GFR
AG Ratio: 0.9 (calc) — ABNORMAL LOW (ref 1.0–2.5)
ALT: 11 U/L (ref 9–46)
AST: 16 U/L (ref 10–35)
Albumin: 3.9 g/dL (ref 3.6–5.1)
Alkaline phosphatase (APISO): 82 U/L (ref 35–144)
BUN/Creatinine Ratio: 14 (calc) (ref 6–22)
BUN: 61 mg/dL — ABNORMAL HIGH (ref 7–25)
CO2: 29 mmol/L (ref 20–32)
Calcium: 9.6 mg/dL (ref 8.6–10.3)
Chloride: 98 mmol/L (ref 98–110)
Creat: 4.28 mg/dL — ABNORMAL HIGH (ref 0.70–1.28)
Globulin: 4.3 g/dL (calc) — ABNORMAL HIGH (ref 1.9–3.7)
Glucose, Bld: 191 mg/dL — ABNORMAL HIGH (ref 65–99)
Potassium: 3.7 mmol/L (ref 3.5–5.3)
Sodium: 142 mmol/L (ref 135–146)
Total Bilirubin: 0.6 mg/dL (ref 0.2–1.2)
Total Protein: 8.2 g/dL — ABNORMAL HIGH (ref 6.1–8.1)
eGFR: 14 mL/min/{1.73_m2} — ABNORMAL LOW (ref >=60)

## 2020-11-30 LAB — CBC
HCT: 40.4 % (ref 38.5–50.0)
Hemoglobin: 12.9 g/dL — ABNORMAL LOW (ref 13.2–17.1)
MCH: 28 pg (ref 27.0–33.0)
MCHC: 31.9 g/dL — ABNORMAL LOW (ref 32.0–36.0)
MCV: 87.8 fL (ref 80.0–100.0)
MPV: 10.2 fL (ref 7.5–12.5)
Platelets: 207 10*3/uL (ref 140–400)
RBC: 4.6 10*6/uL (ref 4.20–5.80)
RDW: 14.9 % (ref 11.0–15.0)
WBC: 8.3 10*3/uL (ref 3.8–10.8)

## 2020-11-30 LAB — TSH: TSH: 4.81 mIU/L — ABNORMAL HIGH (ref 0.40–4.50)

## 2020-12-16 IMAGING — US US ABDOMEN LIMITED
1 series · 14 of 15 positions shown · non-contrast
Comparison: None.

CLINICAL DATA: Abdominal distension

EXAM:
LIMITED ABDOMEN ULTRASOUND FOR ASCITES
TECHNIQUE: Limited ultrasound survey for ascites was performed in all four
abdominal quadrants.

[Series 1: us ascites (abdomen limited) · 14 of 15 slices shown]
[im 1/15]
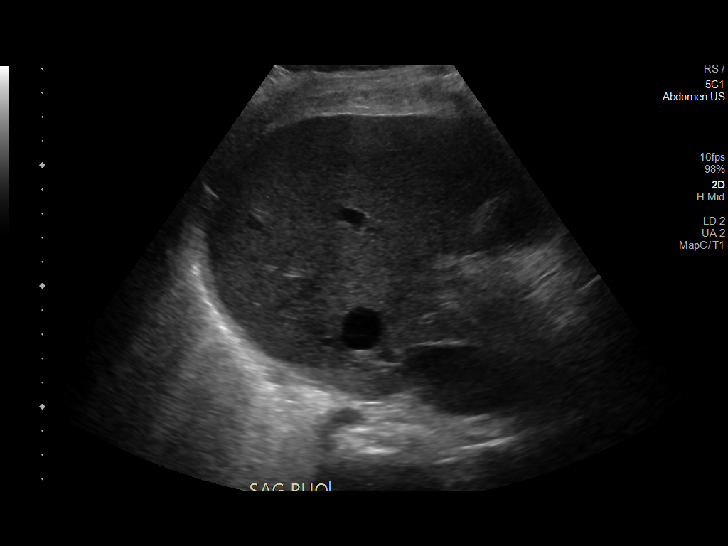
[im 2/15]
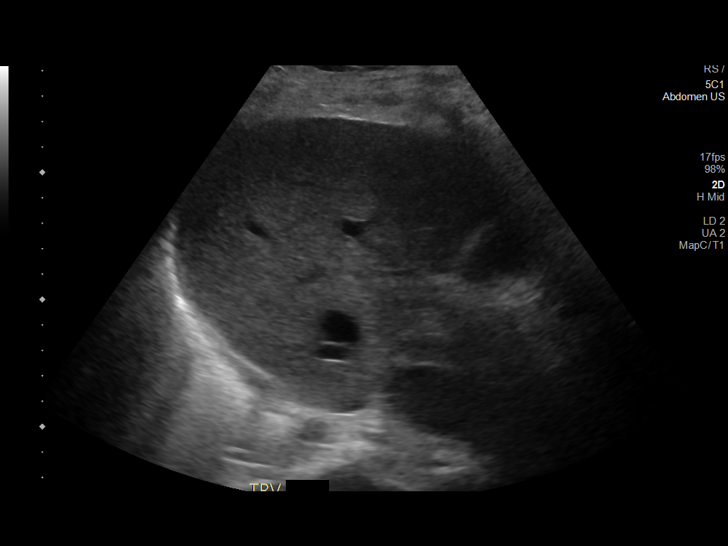
[im 3/15]
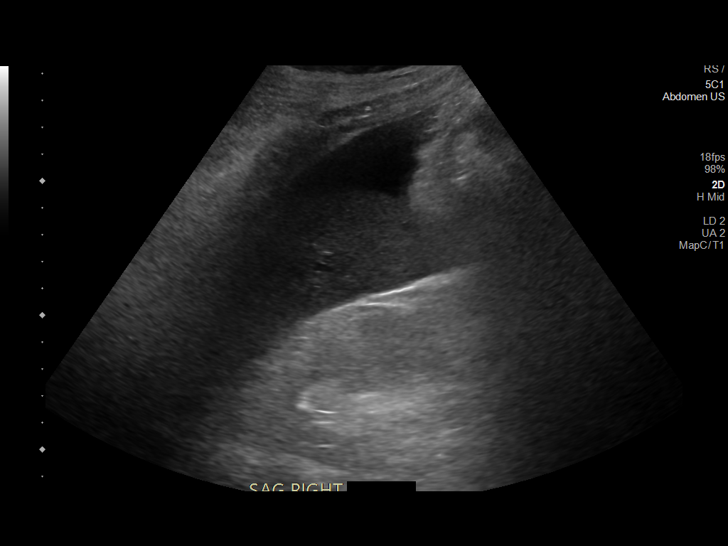
[im 4/15]
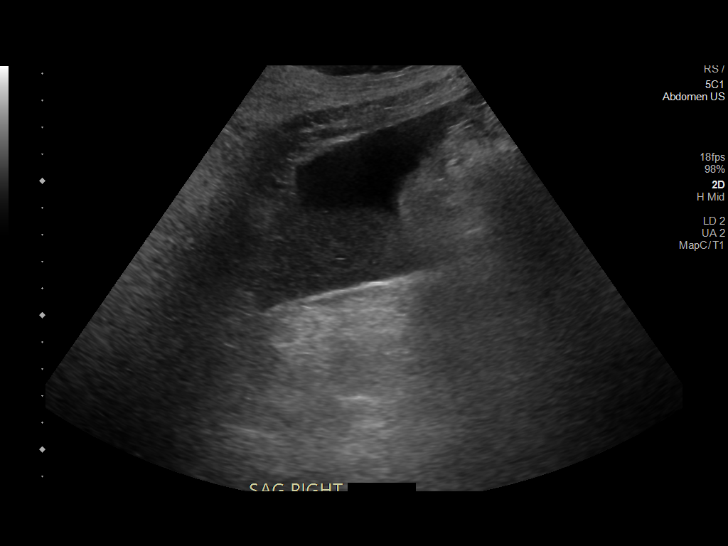
[im 5/15]
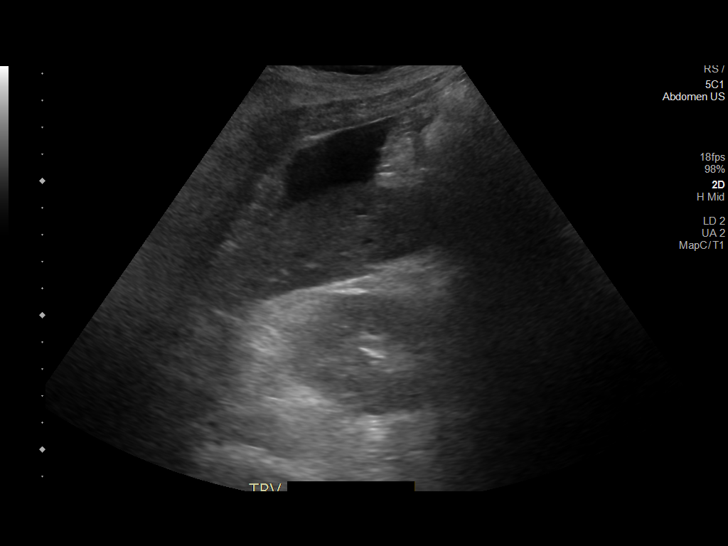
[im 6/15]
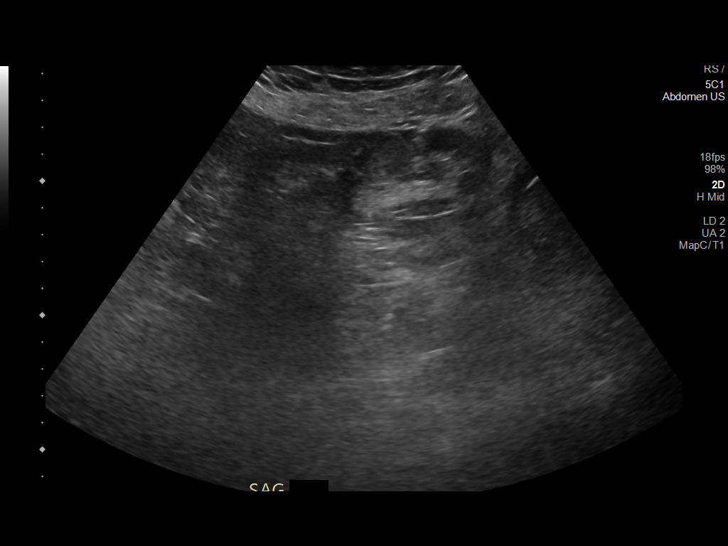
[im 7/15]
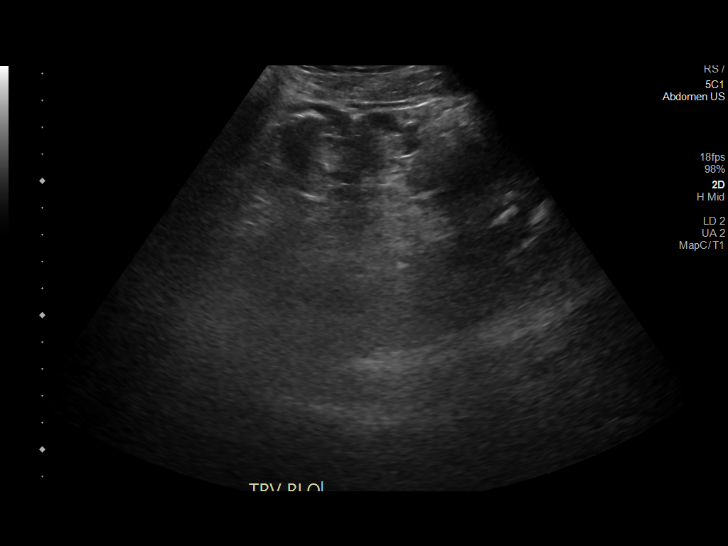
[im 9/15]
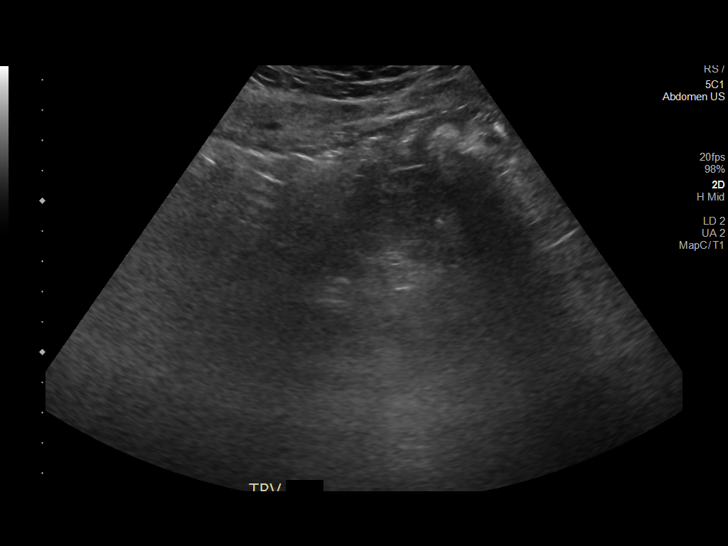
[im 10/15]
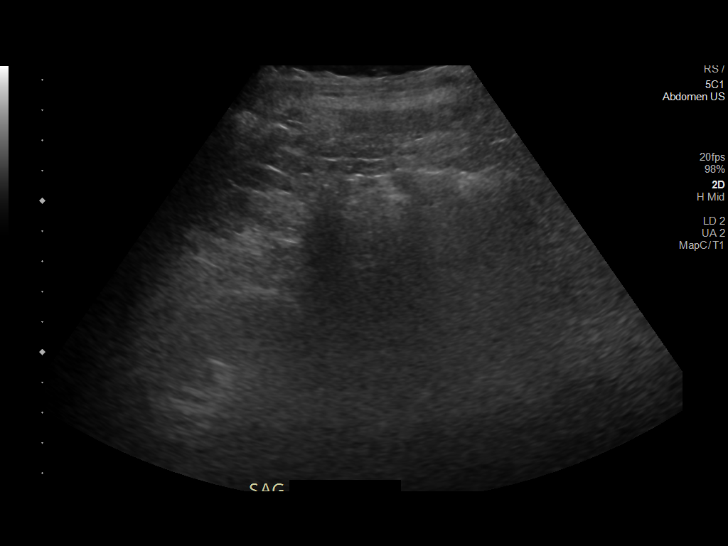
[im 11/15]
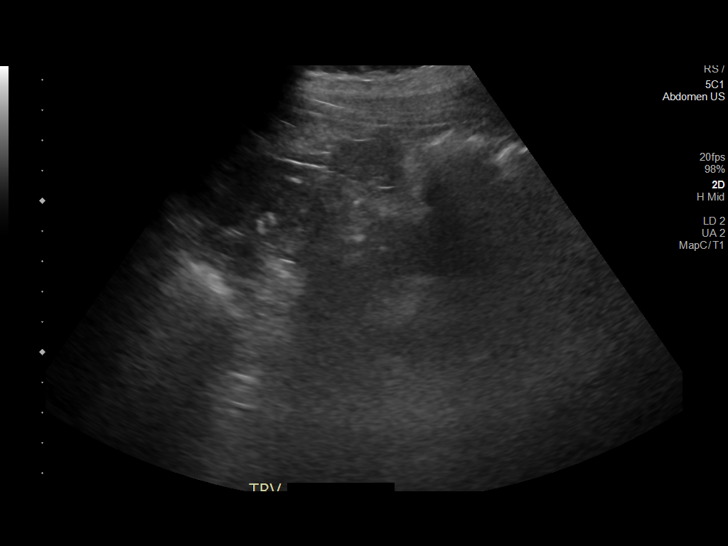
[im 12/15]
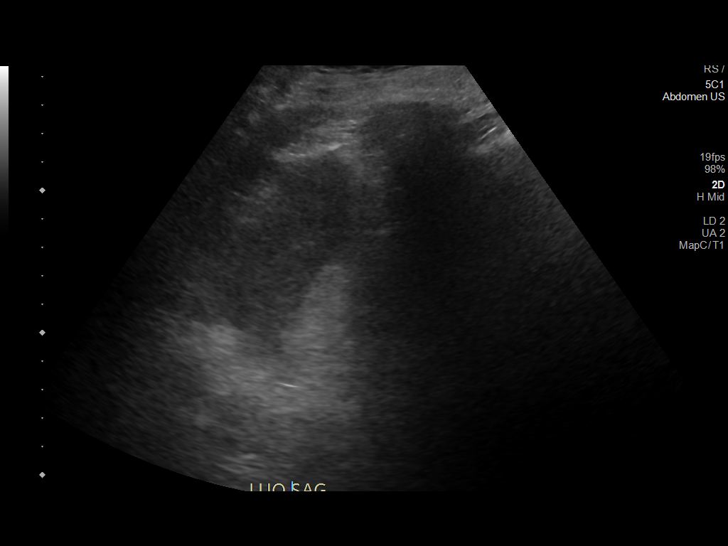
[im 13/15]
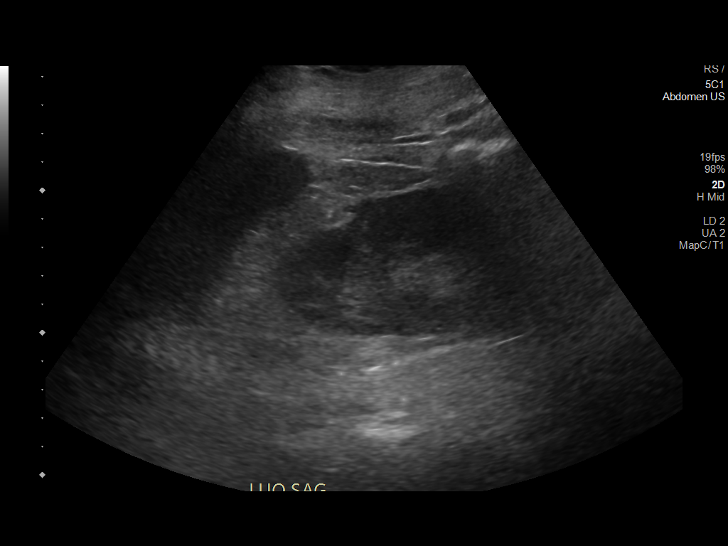
[im 14/15]
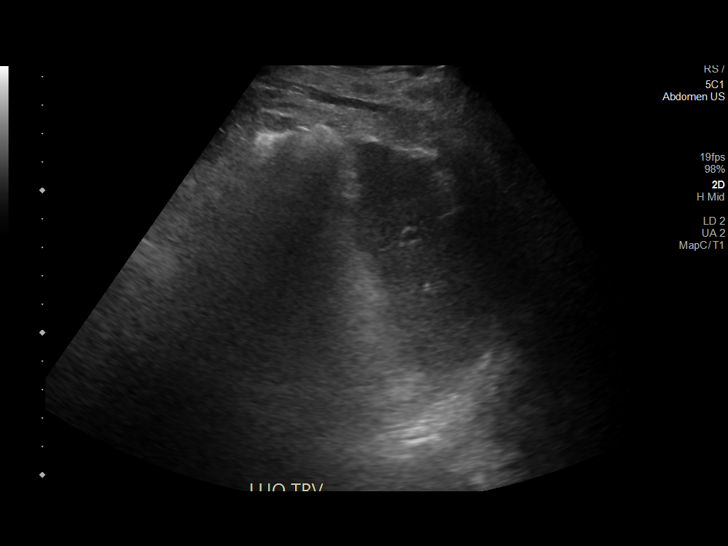
[im 15/15]
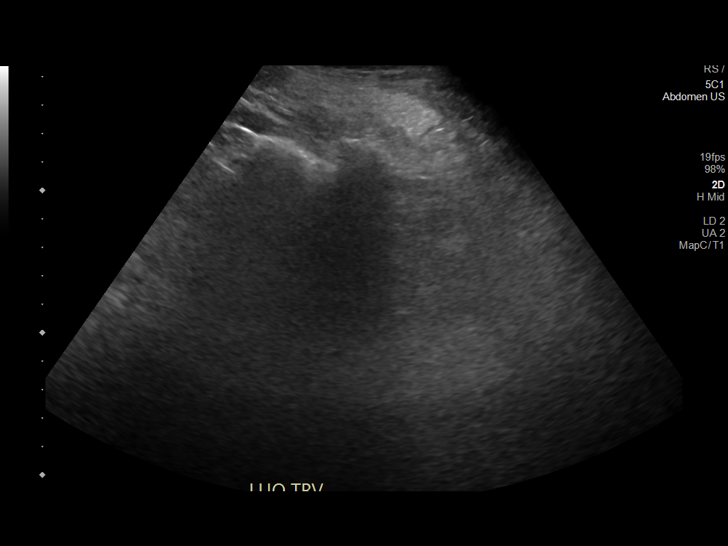

[14 of 15 positions shown; findings below may reference images not displayed]

FINDINGS: Mild ascites is noted primarily within the right pericolic gutter.
IMPRESSION: Mild ascites primarily on the right.

## 2020-12-16 IMAGING — CR DG CHEST 1V
1 series · 1 of 1 positions shown · non-contrast
Comparison: January 17, 2020

CLINICAL DATA: Status post thoracentesis

EXAM:
CHEST  1 VIEW

[chest ap]
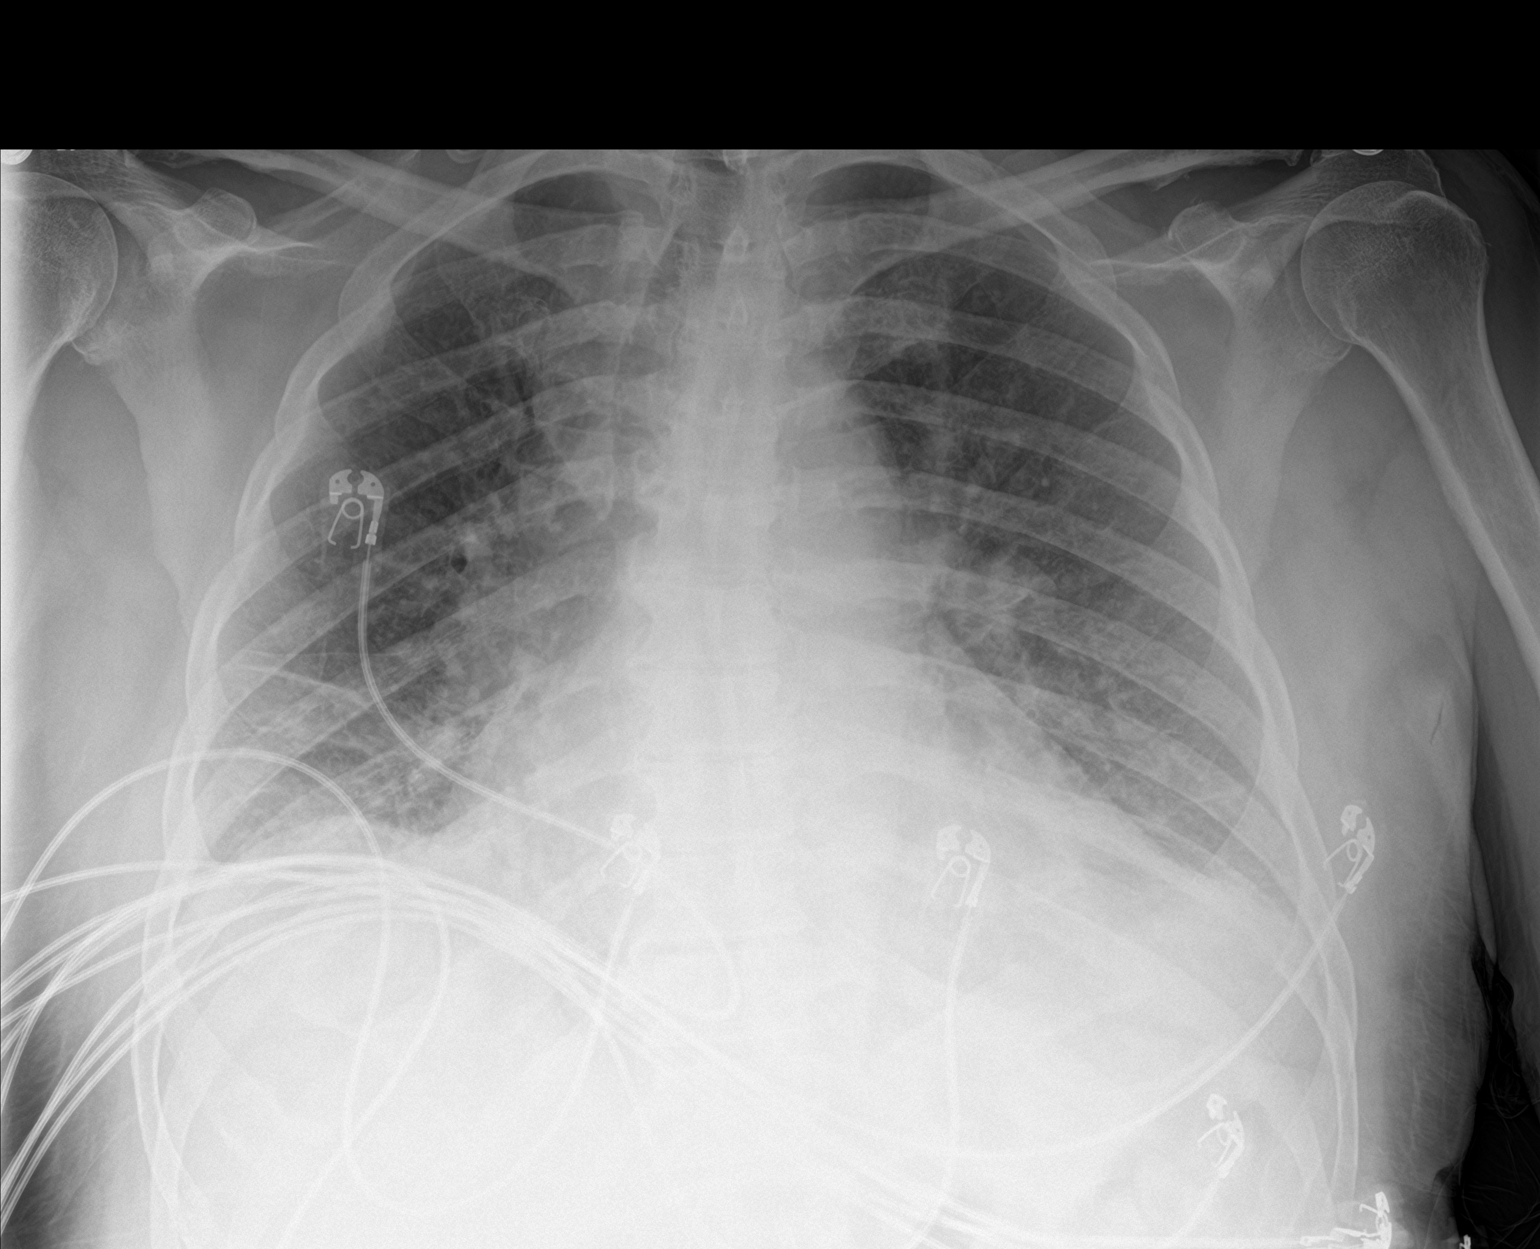

[1 of 1 positions shown; findings below may reference images not displayed]

FINDINGS: No pneumothorax. Right pleural effusion nearly completely resolved
after thoracentesis. There is slight bibasilar atelectasis. Lungs
elsewhere clear. Heart is mildly enlarged with pulmonary vascularity
normal. No adenopathy. No bone lesions.
IMPRESSION: No pneumothorax. Nearly complete resolution of right-sided pleural
effusion. Bibasilar atelectasis. Stable cardiac prominence.

## 2021-01-14 ENCOUNTER — Other Ambulatory Visit: Payer: Self-pay | Admitting: Cardiology

## 2021-01-22 ENCOUNTER — Other Ambulatory Visit: Payer: Self-pay | Admitting: Internal Medicine

## 2021-01-23 LAB — RENAL PROFILE WITH ESTIMATED GFR
Albumin: 4.1 g/dL (ref 3.6–5.1)
BUN/Creatinine Ratio: 17 (calc) (ref 6–22)
BUN: 81 mg/dL — ABNORMAL HIGH (ref 7–25)
CO2: 32 mmol/L (ref 20–32)
Calcium: 9.6 mg/dL (ref 8.6–10.3)
Chloride: 96 mmol/L — ABNORMAL LOW (ref 98–110)
Creat: 4.76 mg/dL — ABNORMAL HIGH (ref 0.70–1.28)
Glucose, Bld: 184 mg/dL — ABNORMAL HIGH (ref 65–99)
Phosphorus: 5.3 mg/dL — ABNORMAL HIGH (ref 2.1–4.3)
Potassium: 3.2 mmol/L — ABNORMAL LOW (ref 3.5–5.3)
Sodium: 140 mmol/L (ref 135–146)
eGFR: 12 mL/min/1.73m2 — ABNORMAL LOW

## 2021-01-23 LAB — CBC
HCT: 41.8 % (ref 38.5–50.0)
Hemoglobin: 13.7 g/dL (ref 13.2–17.1)
MCH: 28.8 pg (ref 27.0–33.0)
MCHC: 32.8 g/dL (ref 32.0–36.0)
MCV: 88 fL (ref 80.0–100.0)
MPV: 10.6 fL (ref 7.5–12.5)
Platelets: 225 10*3/uL (ref 140–400)
RBC: 4.75 10*6/uL (ref 4.20–5.80)
RDW: 14.8 % (ref 11.0–15.0)
WBC: 8.6 10*3/uL (ref 3.8–10.8)

## 2021-02-07 ENCOUNTER — Ambulatory Visit (INDEPENDENT_AMBULATORY_CARE_PROVIDER_SITE_OTHER): Payer: Medicare HMO | Admitting: Podiatry

## 2021-02-07 ENCOUNTER — Other Ambulatory Visit: Payer: Self-pay

## 2021-02-07 DIAGNOSIS — E1100 Type 2 diabetes mellitus with hyperosmolarity without nonketotic hyperglycemic-hyperosmolar coma (NKHHC): Secondary | ICD-10-CM | POA: Diagnosis not present

## 2021-02-07 DIAGNOSIS — M79674 Pain in right toe(s): Secondary | ICD-10-CM

## 2021-02-07 DIAGNOSIS — M79675 Pain in left toe(s): Secondary | ICD-10-CM

## 2021-02-07 DIAGNOSIS — M2141 Flat foot [pes planus] (acquired), right foot: Secondary | ICD-10-CM

## 2021-02-07 DIAGNOSIS — N184 Chronic kidney disease, stage 4 (severe): Secondary | ICD-10-CM

## 2021-02-07 DIAGNOSIS — E1142 Type 2 diabetes mellitus with diabetic polyneuropathy: Secondary | ICD-10-CM

## 2021-02-07 DIAGNOSIS — B351 Tinea unguium: Secondary | ICD-10-CM

## 2021-02-07 DIAGNOSIS — E1122 Type 2 diabetes mellitus with diabetic chronic kidney disease: Secondary | ICD-10-CM

## 2021-02-07 DIAGNOSIS — M2142 Flat foot [pes planus] (acquired), left foot: Secondary | ICD-10-CM

## 2021-02-11 NOTE — Progress Notes (Signed)
  Subjective:  Patient ID: Melvin Mcclure, male    DOB: February 05, 1951,  MRN: [id number]  Chief Complaint  Patient presents with   Nail Problem    Diabetic Foot Exam, right hallux valgus  Referring Provider: Jeanie Cooks, EDWIN     70 y.o. male presents with the above complaint. History confirmed with patient.  Nails are thickened elongated and painful.  He is in the process of being evaluated for possible fistula as well as a renal transplant.  Has numbness and tingling in both feet, takes gabapentin but does not help much  Objective:  Physical Exam: warm, good capillary refill, no trophic changes or ulcerative lesions, normal DP and PT pulses, and abnormal monofilament exam with loss of protective sensation.  Bilateral pes planus Left Foot: dystrophic yellowed discolored nail plates with subungual debris Right Foot: dystrophic yellowed discolored nail plates with subungual debris  Assessment:   1. Pain due to onychomycosis of toenails of both feet   2. Type 2 diabetes mellitus with hyperosmolarity without coma, without long-term current use of insulin ([place])   3. CKD stage 4 due to type 2 diabetes mellitus ([place])   4. Diabetic peripheral neuropathy ([place])   5. Pes planus of both feet      Plan:  Patient was evaluated and treated and all questions answered.  Patient educated on diabetes. Discussed proper diabetic foot care and discussed risks and complications of disease. Educated patient in depth on reasons to return to the office immediately should he/she discover anything concerning or new on the feet. All questions answered. Discussed proper shoes as well.   We discussed diabetic peripheral neuropathy.  I offered him a referral to Dr. Davy Pique at [facility] for spinal cord stimulator consult, he would like to hold off on this for now until long-term plans for his renal issues are sorted out  Discussed the etiology and treatment options for the condition in detail with  the patient. Educated patient on the topical and oral treatment options for mycotic nails. Recommended debridement of the nails today. Sharp and mechanical debridement performed of all painful and mycotic nails today. Nails debrided in length and thickness using a nail nipper to level of comfort. Discussed treatment options including appropriate shoe gear. Follow up as needed for painful nails.    Return in about 3 months (around 05/10/2021) for at risk diabetic foot care.

## 2021-02-25 ENCOUNTER — Ambulatory Visit: Payer: Medicare HMO | Attending: Family

## 2021-02-25 DIAGNOSIS — Z23 Encounter for immunization: Secondary | ICD-10-CM

## 2021-02-25 NOTE — Progress Notes (Signed)
   Covid-19 Vaccination Clinic  Name:  Orrin Yurkovich    MRN: [id number] DOB: 11/10/1950  02/25/2021  Mr. Hawker was observed post Covid-19 immunization for 15 minutes without incident. He was provided with Vaccine Information Sheet and instruction to access the V-Safe system.   Mr. Pask was instructed to call 911 with any severe reactions post vaccine: Difficulty breathing  Swelling of face and throat  A fast heartbeat  A bad rash all over body  Dizziness and weakness   Immunizations Administered     Name Date Dose VIS Date Route   Moderna Covid-19 vaccine Bivalent Booster 02/25/2021  3:15 PM 0.25 mL 12/15/2020 Intramuscular   Manufacturer: Moderna   Lot: [id number]   [place]: [id number]

## 2021-02-27 NOTE — Progress Notes (Deleted)
HPI: Follow-up chronic diastolic congestive heart failure.  Also with chronic stage IV kidney disease.  Nuclear study March 2020 showed ejection fraction 57%, diaphragmatic attenuation but no ischemia.  Echocardiogram June 2021 showed ejection fraction 55 to 60%, moderate left ventricular hypertrophy, mild mitral regurgitation and small pericardial effusion.  PYP scan August 2021 not suggestive of transthyretin amyloidosis.  Renal Dopplers August 2021 showed no renal artery stenosis.  Chest CT January 2022 showed right pleural effusion and consolidation collapse of right lower lobe.  Since last seen  Current Outpatient Medications  Medication Sig Dispense Refill   carvedilol (COREG) 12.5 MG tablet TAKE 1 TABLET BY MOUTH TWICE DAILY WITH A MEAL 180 tablet 1   amLODipine (NORVASC) 10 MG tablet Take 10 mg by mouth daily.     aspirin 81 MG tablet Take 81 mg by mouth daily.     blood glucose meter kit and supplies KIT Dispense based on patient and insurance preference. Use up to four times daily as directed. (FOR ICD-9 250.00, 250.01). 1 each 0   calcitRIOL (ROCALTROL) 0.25 MCG capsule Take 0.25 mcg by mouth daily. Pt takes 1 tablet daily     calcium acetate (PHOSLO) 667 MG capsule Take 1 capsule (667 mg total) by mouth 3 (three) times daily with meals. 90 capsule 1   diclofenac Sodium (VOLTAREN) 1 % GEL Apply 2 g topically 4 (four) times daily.     ferrous sulfate 325 (65 FE) MG tablet Take 325 mg by mouth daily with breakfast.     gabapentin (NEURONTIN) 300 MG capsule Take 1 capsule (300 mg total) by mouth 2 (two) times daily. 60 capsule 0   hydrALAZINE (APRESOLINE) 50 MG tablet Take 1 tablet (50 mg total) by mouth every 8 (eight) hours. (Patient taking differently: Take 50 mg by mouth 3 (three) times daily. Pt takes 1 tablet daily) 90 tablet 0   hydrOXYzine (ATARAX/VISTARIL) 25 MG tablet Take 25 mg by mouth 2 (two) times daily as needed for anxiety.     insulin glargine (LANTUS SOLOSTAR) 100  UNIT/ML Solostar Pen Inject into the skin.     isosorbide mononitrate (IMDUR) 60 MG 24 hr tablet Take 1 tablet (60 mg total) by mouth every evening. 90 tablet 3   meclizine (ANTIVERT) 25 MG tablet Take 1 tablet (25 mg total) by mouth 3 (three) times daily. 30 tablet 0   metolazone (ZAROXOLYN) 5 MG tablet 5 mg once weekly and as needed     senna-docusate (SENOKOT-S) 8.6-50 MG tablet Take 1 tablet by mouth 2 (two) times daily as needed for mild constipation.      torsemide (DEMADEX) 20 MG tablet Take 40 mg by mouth 2 (two) times daily.     No current facility-administered medications for this visit.     Past Medical History:  Diagnosis Date   Anemia in chronic kidney disease (CKD)    CHF (congestive heart failure) ([place]) 05/28/2018   dx 05/28/18   Diabetes mellitus without complication ([facility])    Hyperlipidemia    Hypertension    Iron deficiency anemia    Perforating neurotrophic ulcer of foot ([place])     Past Surgical History:  Procedure Laterality Date   ACHILLES TENDON REPAIR     CYST EXCISION     IR THORACENTESIS ASP PLEURAL SPACE W/IMG GUIDE  12/26/2019   IR THORACENTESIS ASP PLEURAL SPACE W/IMG GUIDE  01/18/2020   THORACENTESIS  04/16/2020    Social History   Socioeconomic History  Marital status: Married    Spouse name: Not on file   Number of children: Not on file   Years of education: Not on file   Highest education level: Not on file  Occupational History   Not on file  Tobacco Use   Smoking status: Former   Smokeless tobacco: Never   Tobacco comments:    smoked for only 1.5 years in the 70's  Vaping Use   Vaping Use: Never used  Substance and Sexual Activity   Alcohol use: No    Alcohol/week: 0.0 standard drinks   Drug use: No   Sexual activity: Not on file  Other Topics Concern   Not on file  Social History Narrative   Not on file   Social Determinants of Health   Financial Resource Strain: Not on file  Food Insecurity: Not on file  Transportation  Needs: Not on file  Physical Activity: Not on file  Stress: Not on file  Social Connections: Not on file  Intimate Partner Violence: Not on file    Family History  Problem Relation Age of Onset   Diabetes Mellitus II Mother    Hypertension Father     ROS: no fevers or chills, productive cough, hemoptysis, dysphasia, odynophagia, melena, hematochezia, dysuria, hematuria, rash, seizure activity, orthopnea, PND, pedal edema, claudication. Remaining systems are negative.  Physical Exam: Well-developed well-nourished in no acute distress.  Skin is warm and dry.  HEENT is normal.  Neck is supple.  Chest is clear to auscultation with normal expansion.  Cardiovascular exam is regular rate and rhythm.  Abdominal exam nontender or distended. No masses palpated. Extremities show no edema. neuro grossly intact  ECG- personally reviewed  A/P  1 chronic diastolic congestive heart failure-he is euvolemic today.  Continue diuretics at present dose.  Renal function monitored by nephrology.  2 hypertension-patient's blood pressure is controlled.  Continue present medical regimen and follow.  3 chronic stage IV kidney disease-patient is followed by nephrology.  Kirk Ruths, MD

## 2021-03-04 ENCOUNTER — Other Ambulatory Visit: Payer: Self-pay | Admitting: Cardiology

## 2021-03-05 ENCOUNTER — Other Ambulatory Visit: Payer: Self-pay

## 2021-03-05 DIAGNOSIS — E1122 Type 2 diabetes mellitus with diabetic chronic kidney disease: Secondary | ICD-10-CM

## 2021-03-07 ENCOUNTER — Ambulatory Visit: Payer: Medicare Other | Admitting: Cardiology

## 2021-03-11 NOTE — H&P (View-Only) (Signed)
[facility] [place]  ASSESSMENT / PLAN: Melvin Mcclure is a 70 y.o. right handed male in need of permanent hemodialysis access. I reviewed options for dialysis in detail with the patient. I counseled the patient that dialysis access requires surveillance and periodic maintenance. Plan to proceed with left brachiocephalic arteriovenous fistula creation next week in OR.  CHIEF COMPLAINT: CKD V  HISTORY OF PRESENT ILLNESS: Melvin Mcclure is a 70 y.o. male referred to clinic for dialysis access creation.  The patient is a right-handed gentleman.  He has never had a central venous catheterization.  He has never had a pacemaker.  No history of trauma to the upper extremities.  We reviewed options for dialysis access in detail, and what to expect from dialysis access in the short, intermediate, and long-term.  Past Medical History:  Diagnosis Date   Anemia in chronic kidney disease (CKD)    CHF (congestive heart failure) ([place]) 05/28/2018   dx 05/28/18   Diabetes mellitus without complication (HCC)    Hyperlipidemia    Hypertension    Iron deficiency anemia    Perforating neurotrophic ulcer of foot ([place])     Past Surgical History:  Procedure Laterality Date   ACHILLES TENDON REPAIR     CYST EXCISION     IR THORACENTESIS ASP PLEURAL SPACE W/IMG GUIDE  12/26/2019   IR THORACENTESIS ASP PLEURAL SPACE W/IMG GUIDE  01/18/2020   THORACENTESIS  04/16/2020    Family History  Problem Relation Age of Onset   Diabetes Mellitus II Mother    Hypertension Father     Social History   Socioeconomic History   Marital status: Married    Spouse name: Not on file   Number of children: Not on file   Years of education: Not on file   Highest education level: Not on file  Occupational History   Not on file  Tobacco Use   Smoking status: Former   Smokeless tobacco: Never   Tobacco comments:    smoked for only 1.5 years in the 70's  Vaping Use   Vaping Use: Never used   Substance and Sexual Activity   Alcohol use: No    Alcohol/week: 0.0 standard drinks   Drug use: No   Sexual activity: Not on file  Other Topics Concern   Not on file  Social History Narrative   Not on file   Social Determinants of Health   Financial Resource Strain: Not on file  Food Insecurity: Not on file  Transportation Needs: Not on file  Physical Activity: Not on file  Stress: Not on file  Social Connections: Not on file  Intimate Partner Violence: Not on file    No Known Allergies  Current Outpatient Medications  Medication Sig Dispense Refill   amLODipine (NORVASC) 10 MG tablet Take 10 mg by mouth daily.     aspirin 81 MG tablet Take 81 mg by mouth daily.     blood glucose meter kit and supplies KIT Dispense based on patient and insurance preference. Use up to four times daily as directed. (FOR ICD-9 250.00, 250.01). 1 each 0   calcitRIOL (ROCALTROL) 0.25 MCG capsule Take 0.25 mcg by mouth daily. Pt takes 1 tablet daily     calcium acetate (PHOSLO) 667 MG capsule Take 1 capsule (667 mg total) by mouth 3 (three) times daily with meals. 90 capsule 1   carvedilol (COREG) 12.5 MG tablet TAKE 1 TABLET BY MOUTH TWICE DAILY WITH A MEAL 180 tablet 1  diclofenac Sodium (VOLTAREN) 1 % GEL Apply 2 g topically 4 (four) times daily.     ferrous sulfate 325 (65 FE) MG tablet Take 325 mg by mouth daily with breakfast.     gabapentin (NEURONTIN) 300 MG capsule Take 1 capsule (300 mg total) by mouth 2 (two) times daily. 60 capsule 0   hydrALAZINE (APRESOLINE) 50 MG tablet Take 1 tablet (50 mg total) by mouth every 8 (eight) hours. (Patient taking differently: Take 50 mg by mouth 3 (three) times daily. Pt takes 1 tablet daily) 90 tablet 0   hydrOXYzine (ATARAX/VISTARIL) 25 MG tablet Take 25 mg by mouth 2 (two) times daily as needed for anxiety.     insulin glargine (LANTUS SOLOSTAR) 100 UNIT/ML Solostar Pen Inject into the skin.     isosorbide mononitrate (IMDUR) 60 MG 24 hr tablet Take  1 tablet by mouth in the evening 90 tablet 0   meclizine (ANTIVERT) 25 MG tablet Take 1 tablet (25 mg total) by mouth 3 (three) times daily. 30 tablet 0   metolazone (ZAROXOLYN) 5 MG tablet 5 mg once weekly and as needed     senna-docusate (SENOKOT-S) 8.6-50 MG tablet Take 1 tablet by mouth 2 (two) times daily as needed for mild constipation.      torsemide (DEMADEX) 20 MG tablet Take 40 mg by mouth 2 (two) times daily.     No current facility-administered medications for this visit.    REVIEW OF SYSTEMS:  $RemoveB'[X]'gYstQNuy$  denotes positive finding, $RemoveBeforeDEI'[ ]'dotMQkCKuynqMvMD$  denotes negative finding Cardiac  Comments:  Chest pain or chest pressure:    Shortness of breath upon exertion:    Short of breath when lying flat:    Irregular heart rhythm:        Vascular    Pain in calf, thigh, or hip brought on by ambulation:    Pain in feet at night that wakes you up from your sleep:     Blood clot in your veins:    Leg swelling:         Pulmonary    Oxygen at home:    Productive cough:     Wheezing:         Neurologic    Sudden weakness in arms or legs:     Sudden numbness in arms or legs:     Sudden onset of difficulty speaking or slurred speech:    Temporary loss of vision in one eye:     Problems with dizziness:         Gastrointestinal    Blood in stool:     Vomited blood:         Genitourinary    Burning when urinating:     Blood in urine:        Psychiatric    Major depression:         Hematologic    Bleeding problems:    Problems with blood clotting too easily:        Skin    Rashes or ulcers:        Constitutional    Fever or chills:      PHYSICAL EXAM Vitals:   03/12/21 1234  BP: 122/70  Pulse: 61  Resp: 20  Temp: 98.7 F (37.1 C)  SpO2: 96%  Weight: 218 lb (98.9 kg)  Height: $Remove'5\' 10"'lxyhVbO$  (1.778 m)    Constitutional: well appearing. no distress. Appears well nourished.  Neurologic: CN intact. no focal findings. no sensory loss. Psychiatric:  Mood and affect  symmetric and  appropriate. Eyes:  No icterus. No conjunctival pallor. Ears, nose, throat:  mucous membranes moist. Midline trachea.  Cardiac: regular rate and rhythm.  Respiratory:  unlabored. Abdominal:  soft, non-tender, non-distended.  Peripheral vascular: 2+ radial and brachial pulse. Cephalic vein visible over the bicep. Extremity: no edema. no cyanosis. no pallor.  Skin: no gangrene. no ulceration.  Lymphatic: no Stemmer's sign. no palpable lymphadenopathy.  PERTINENT LABORATORY AND RADIOLOGIC DATA  Most recent CBC CBC Latest Ref Rng & Units 01/22/2021 11/29/2020 04/21/2020  WBC 3.8 - 10.8 Thousand/uL 8.6 8.3 8.1  Hemoglobin 13.2 - 17.1 g/dL 13.7 12.9(L) 10.7(L)  Hematocrit 38.5 - 50.0 % 41.8 40.4 34.7(L)  Platelets 140 - 400 Thousand/uL 225 207 252     Most recent CMP CMP Latest Ref Rng & Units 01/22/2021 11/29/2020 04/21/2020  Glucose 65 - 99 mg/dL 184(H) 191(H) 125(H)  BUN 7 - 25 mg/dL 81(H) 61(H) 100(H)  Creatinine 0.70 - 1.28 mg/dL 4.76(H) 4.28(H) 3.79(H)  Sodium 135 - 146 mmol/L 140 142 140  Potassium 3.5 - 5.3 mmol/L 3.2(L) 3.7 4.1  Chloride 98 - 110 mmol/L 96(L) 98 91(L)  CO2 20 - 32 mmol/L 32 29 34(H)  Calcium 8.6 - 10.3 mg/dL 9.6 9.6 8.3(L)  Total Protein 6.1 - 8.1 g/dL - 8.2(H) -  Total Bilirubin 0.2 - 1.2 mg/dL - 0.6 -  Alkaline Phos 38 - 126 U/L - - -  AST 10 - 35 U/L - 16 -  ALT 9 - 46 U/L - 11 -    Renal function CrCl cannot be calculated (Patient's most recent lab result is older than the maximum 21 days allowed.).  Hgb A1c MFr Bld (%)  Date Value  04/18/2020 7.3 (H)    LDL Cholesterol (Calc)  Date Value Ref Range Status  11/29/2020 118 (H) mg/dL (calc) Final    Comment:    Reference range: <100 . Desirable range <100 mg/dL for primary prevention;   <70 mg/dL for patients with CHD or diabetic patients  with > or = 2 CHD risk factors. Marland Kitchen LDL-C is now calculated using the Martin-Hopkins  calculation, which is a validated novel method providing  better  accuracy than the Friedewald equation in the  estimation of LDL-C.  Cresenciano Genre et al. Annamaria Helling. 1583;094(07): 2061-2068  (http://education.[facility].com/faq/FAQ164)      Preoperative vein mapping shows adequate left cephalic vein.  Yevonne Aline. Stanford Breed, MD [facility] Phone Number: [phone] 03/12/2021 2:14 PM  Total time spent on preparing this encounter including chart review, data review, collecting history, examining the patient, coordinating care for this new patient, 45 minutes.  Portions of this report may have been transcribed using voice recognition software.  Every effort has been made to ensure accuracy; however, inadvertent computerized transcription errors may still be present.

## 2021-03-11 NOTE — Progress Notes (Signed)
[facility]   ASSESSMENT / PLAN: Melvin Mcclure is a 70 y.o. right handed male in need of permanent hemodialysis access. I reviewed options for dialysis in detail with the patient. I counseled the patient that dialysis access requires surveillance and periodic maintenance. Plan to proceed with left brachiocephalic arteriovenous fistula creation next week in OR.  CHIEF COMPLAINT: CKD V  HISTORY OF PRESENT ILLNESS: Melvin Mcclure is a 70 y.o. male referred to clinic for dialysis access creation.  The patient is a right-handed gentleman.  He has never had a central venous catheterization.  He has never had a pacemaker.  No history of trauma to the upper extremities.  We reviewed options for dialysis access in detail, and what to expect from dialysis access in the short, intermediate, and long-term.  Past Medical History:  Diagnosis Date   Anemia in chronic kidney disease (CKD)    CHF (congestive heart failure) ([place]) 05/28/2018   dx 05/28/18   Diabetes mellitus without complication (HCC)    Hyperlipidemia    Hypertension    Iron deficiency anemia    Perforating neurotrophic ulcer of foot ([place])     Past Surgical History:  Procedure Laterality Date   ACHILLES TENDON REPAIR     CYST EXCISION     IR THORACENTESIS ASP PLEURAL SPACE W/IMG GUIDE  12/26/2019   IR THORACENTESIS ASP PLEURAL SPACE W/IMG GUIDE  01/18/2020   THORACENTESIS  04/16/2020    Family History  Problem Relation Age of Onset   Diabetes Mellitus II Mother    Hypertension Father     Social History   Socioeconomic History   Marital status: Married    Spouse name: Not on file   Number of children: Not on file   Years of education: Not on file   Highest education level: Not on file  Occupational History   Not on file  Tobacco Use   Smoking status: Former   Smokeless tobacco: Never   Tobacco comments:    smoked for only 1.5 years in the 70's  Vaping Use   Vaping Use: Never used   Substance and Sexual Activity   Alcohol use: No    Alcohol/week: 0.0 standard drinks   Drug use: No   Sexual activity: Not on file  Other Topics Concern   Not on file  Social History Narrative   Not on file   Social Determinants of Health   Financial Resource Strain: Not on file  Food Insecurity: Not on file  Transportation Needs: Not on file  Physical Activity: Not on file  Stress: Not on file  Social Connections: Not on file  Intimate Partner Violence: Not on file    No Known Allergies  Current Outpatient Medications  Medication Sig Dispense Refill   amLODipine (NORVASC) 10 MG tablet Take 10 mg by mouth daily.     aspirin 81 MG tablet Take 81 mg by mouth daily.     blood glucose meter kit and supplies KIT Dispense based on patient and insurance preference. Use up to four times daily as directed. (FOR ICD-9 250.00, 250.01). 1 each 0   calcitRIOL (ROCALTROL) 0.25 MCG capsule Take 0.25 mcg by mouth daily. Pt takes 1 tablet daily     calcium acetate (PHOSLO) 667 MG capsule Take 1 capsule (667 mg total) by mouth 3 (three) times daily with meals. 90 capsule 1   carvedilol (COREG) 12.5 MG tablet TAKE 1 TABLET BY MOUTH TWICE DAILY WITH A MEAL 180 tablet 1  diclofenac Sodium (VOLTAREN) 1 % GEL Apply 2 g topically 4 (four) times daily.     ferrous sulfate 325 (65 FE) MG tablet Take 325 mg by mouth daily with breakfast.     gabapentin (NEURONTIN) 300 MG capsule Take 1 capsule (300 mg total) by mouth 2 (two) times daily. 60 capsule 0   hydrALAZINE (APRESOLINE) 50 MG tablet Take 1 tablet (50 mg total) by mouth every 8 (eight) hours. (Patient taking differently: Take 50 mg by mouth 3 (three) times daily. Pt takes 1 tablet daily) 90 tablet 0   hydrOXYzine (ATARAX/VISTARIL) 25 MG tablet Take 25 mg by mouth 2 (two) times daily as needed for anxiety.     insulin glargine (LANTUS SOLOSTAR) 100 UNIT/ML Solostar Pen Inject into the skin.     isosorbide mononitrate (IMDUR) 60 MG 24 hr tablet Take  1 tablet by mouth in the evening 90 tablet 0   meclizine (ANTIVERT) 25 MG tablet Take 1 tablet (25 mg total) by mouth 3 (three) times daily. 30 tablet 0   metolazone (ZAROXOLYN) 5 MG tablet 5 mg once weekly and as needed     senna-docusate (SENOKOT-S) 8.6-50 MG tablet Take 1 tablet by mouth 2 (two) times daily as needed for mild constipation.      torsemide (DEMADEX) 20 MG tablet Take 40 mg by mouth 2 (two) times daily.     No current facility-administered medications for this visit.    REVIEW OF SYSTEMS:  $RemoveB'[X]'NeYIXPTP$  denotes positive finding, $RemoveBeforeDEI'[ ]'WIpduvpPbvcEtUmd$  denotes negative finding Cardiac  Comments:  Chest pain or chest pressure:    Shortness of breath upon exertion:    Short of breath when lying flat:    Irregular heart rhythm:        Vascular    Pain in calf, thigh, or hip brought on by ambulation:    Pain in feet at night that wakes you up from your sleep:     Blood clot in your veins:    Leg swelling:         Pulmonary    Oxygen at home:    Productive cough:     Wheezing:         Neurologic    Sudden weakness in arms or legs:     Sudden numbness in arms or legs:     Sudden onset of difficulty speaking or slurred speech:    Temporary loss of vision in one eye:     Problems with dizziness:         Gastrointestinal    Blood in stool:     Vomited blood:         Genitourinary    Burning when urinating:     Blood in urine:        Psychiatric    Major depression:         Hematologic    Bleeding problems:    Problems with blood clotting too easily:        Skin    Rashes or ulcers:        Constitutional    Fever or chills:      PHYSICAL EXAM Vitals:   03/12/21 1234  BP: 122/70  Pulse: 61  Resp: 20  Temp: 98.7 F (37.1 C)  SpO2: 96%  Weight: 218 lb (98.9 kg)  Height: $Remove'5\' 10"'faNUtTL$  (1.778 m)    Constitutional: well appearing. no distress. Appears well nourished.  Neurologic: CN intact. no focal findings. no sensory loss. Psychiatric:  Mood and affect  symmetric and  appropriate. Eyes:  No icterus. No conjunctival pallor. Ears, nose, throat:  mucous membranes moist. Midline trachea.  Cardiac: regular rate and rhythm.  Respiratory:  unlabored. Abdominal:  soft, non-tender, non-distended.  Peripheral vascular: 2+ radial and brachial pulse. Cephalic vein visible over the bicep. Extremity: no edema. no cyanosis. no pallor.  Skin: no gangrene. no ulceration.  Lymphatic: no Stemmer's sign. no palpable lymphadenopathy.  PERTINENT LABORATORY AND RADIOLOGIC DATA  Most recent CBC CBC Latest Ref Rng & Units 01/22/2021 11/29/2020 04/21/2020  WBC 3.8 - 10.8 Thousand/uL 8.6 8.3 8.1  Hemoglobin 13.2 - 17.1 g/dL 13.7 12.9(L) 10.7(L)  Hematocrit 38.5 - 50.0 % 41.8 40.4 34.7(L)  Platelets 140 - 400 Thousand/uL 225 207 252     Most recent CMP CMP Latest Ref Rng & Units 01/22/2021 11/29/2020 04/21/2020  Glucose 65 - 99 mg/dL 184(H) 191(H) 125(H)  BUN 7 - 25 mg/dL 81(H) 61(H) 100(H)  Creatinine 0.70 - 1.28 mg/dL 4.76(H) 4.28(H) 3.79(H)  Sodium 135 - 146 mmol/L 140 142 140  Potassium 3.5 - 5.3 mmol/L 3.2(L) 3.7 4.1  Chloride 98 - 110 mmol/L 96(L) 98 91(L)  CO2 20 - 32 mmol/L 32 29 34(H)  Calcium 8.6 - 10.3 mg/dL 9.6 9.6 8.3(L)  Total Protein 6.1 - 8.1 g/dL - 8.2(H) -  Total Bilirubin 0.2 - 1.2 mg/dL - 0.6 -  Alkaline Phos 38 - 126 U/L - - -  AST 10 - 35 U/L - 16 -  ALT 9 - 46 U/L - 11 -    Renal function CrCl cannot be calculated (Patient's most recent lab result is older than the maximum 21 days allowed.).  Hgb A1c MFr Bld (%)  Date Value  04/18/2020 7.3 (H)    LDL Cholesterol (Calc)  Date Value Ref Range Status  11/29/2020 118 (H) mg/dL (calc) Final    Comment:    Reference range: <100 . Desirable range <100 mg/dL for primary prevention;   <70 mg/dL for patients with CHD or diabetic patients  with > or = 2 CHD risk factors. Marland Kitchen LDL-C is now calculated using the Martin-Hopkins  calculation, which is a validated novel method providing  better  accuracy than the Friedewald equation in the  estimation of LDL-C.  Cresenciano Genre et al. Annamaria Helling. 6203;559(74): 2061-2068  (http://education.[facility].com/faq/FAQ164)      Preoperative vein mapping shows adequate left cephalic vein.  Yevonne Aline. Stanford Breed, MD [facility] Phone Number: [phone] 03/12/2021 2:14 PM  Total time spent on preparing this encounter including chart review, data review, collecting history, examining the patient, coordinating care for this new patient, 45 minutes.  Portions of this report may have been transcribed using voice recognition software.  Every effort has been made to ensure accuracy; however, inadvertent computerized transcription errors may still be present.

## 2021-03-12 ENCOUNTER — Ambulatory Visit (HOSPITAL_COMMUNITY)
Admission: RE | Admit: 2021-03-12 | Discharge: 2021-03-12 | Disposition: A | Discharge status: Home / Self Care | Payer: Medicare HMO | Source: Ambulatory Visit | Attending: Vascular Surgery | Admitting: Vascular Surgery

## 2021-03-12 ENCOUNTER — Other Ambulatory Visit: Payer: Self-pay

## 2021-03-12 ENCOUNTER — Ambulatory Visit: Payer: Medicare HMO | Admitting: Vascular Surgery

## 2021-03-12 ENCOUNTER — Ambulatory Visit (INDEPENDENT_AMBULATORY_CARE_PROVIDER_SITE_OTHER)
Admission: RE | Admit: 2021-03-12 | Discharge: 2021-03-12 | Disposition: A | Discharge status: Home / Self Care | Payer: Medicare HMO | Source: Ambulatory Visit | Attending: Vascular Surgery | Admitting: Vascular Surgery

## 2021-03-12 VITALS — BP 122/70 | HR 61 | Temp 98.7°F | Resp 20 | Ht 70.0 in | Wt 218.0 lb

## 2021-03-12 DIAGNOSIS — E1122 Type 2 diabetes mellitus with diabetic chronic kidney disease: Secondary | ICD-10-CM

## 2021-03-12 DIAGNOSIS — N184 Chronic kidney disease, stage 4 (severe): Secondary | ICD-10-CM | POA: Diagnosis present

## 2021-03-12 DIAGNOSIS — N185 Chronic kidney disease, stage 5: Secondary | ICD-10-CM | POA: Diagnosis not present

## 2021-03-13 ENCOUNTER — Other Ambulatory Visit: Payer: Self-pay

## 2021-03-20 ENCOUNTER — Encounter (HOSPITAL_COMMUNITY): Payer: Self-pay | Admitting: Vascular Surgery

## 2021-03-20 ENCOUNTER — Other Ambulatory Visit: Payer: Self-pay

## 2021-03-20 NOTE — Anesthesia Preprocedure Evaluation (Addendum)
Anesthesia Evaluation  Patient identified by MRN, date of birth, ID band Patient awake    Reviewed: Allergy & Precautions, NPO status , Patient's Chart, lab work & pertinent test results, reviewed documented beta blocker date and time   Airway Mallampati: III  TM Distance: >3 FB Neck ROM: Full  Mouth opening: Limited Mouth Opening  Dental  (+) Missing, Dental Advisory Given   Pulmonary neg pulmonary ROS, former smoker,    Pulmonary exam normal breath sounds clear to auscultation       Cardiovascular hypertension, Pt. on medications and Pt. on home beta blockers +CHF  Normal cardiovascular exam Rhythm:Regular Rate:Normal  TTE 2021 1. Left ventricular ejection fraction, by estimation, is 55 to 60%. The  left ventricle has normal function. The left ventricle has no regional  wall motion abnormalities. There is moderate left ventricular hypertrophy.  Left ventricular diastolic  parameters are indeterminate.  2. Right ventricular systolic function is normal. The right ventricular  size is normal. Tricuspid regurgitation signal is inadequate for assessing  PA pressure.  3. The mitral valve is grossly normal. Mild mitral valve regurgitation.  4. The aortic valve is tricuspid, mildly calcified. Aortic valve  regurgitation is not visualized.  5. The inferior vena cava is normal in size with greater than 50%  respiratory variability, suggesting right atrial pressure of 3 mmHg.  6. Trivial to small circumferential pericardial effusion.  Stress Test 2020 The left ventricular ejection fraction is normal (55-65%). Nuclear stress EF: 57%. No wall motion abnormalities There was no ST segment deviation noted during stress. Defect 1: There is a medium defect of mild severity present in the basal inferior location. This appears secondary to diaphragmatic attenuation artifact This is a low risk study. No significant evidence of ischemia  identified.    Neuro/Psych PSYCHIATRIC DISORDERS Anxiety negative neurological ROS     GI/Hepatic negative GI ROS, Neg liver ROS,   Endo/Other  diabetes, Type 2, Insulin Dependent  Renal/GU ESRFRenal diseaseNot currently on dialysis  negative genitourinary   Musculoskeletal  (+) Arthritis ,   Abdominal   Peds  Hematology negative hematology ROS (+)   Anesthesia Other Findings   Reproductive/Obstetrics                           Anesthesia Physical Anesthesia Plan  ASA: 3  Anesthesia Plan: MAC and Regional   Post-op Pain Management:  Regional for Post-op pain   Induction: Intravenous  PONV Risk Score and Plan: Propofol infusion, Treatment may vary due to age or medical condition, Ondansetron and Dexamethasone  Airway Management Planned: Natural Airway  Additional Equipment:   Intra-op Plan:   Post-operative Plan:   Informed Consent: I have reviewed the patients History and Physical, chart, labs and discussed the procedure including the risks, benefits and alternatives for the proposed anesthesia with the patient or authorized representative who has indicated his/her understanding and acceptance.     Dental advisory given  Plan Discussed with: CRNA  Anesthesia Plan Comments: ( )       Anesthesia Quick Evaluation

## 2021-03-20 NOTE — Progress Notes (Signed)
Anesthesia Chart Review: Same day workup  CKD 5 not yet on HD. Followed by nephrologist Dr. Royce Macadamia.   Followed by cardiology for history of HFpEF.   Nuclear study March 2020 showed ejection fraction 57%, diaphragmatic attenuation but no ischemia.  Echocardiogram June 2021 showed ejection fraction 55 to 60%, moderate left ventricular hypertrophy, mild mitral regurgitation and small pericardial effusion.  PYP scan August 2021 not suggestive of transthyretin amyloidosis.  Renal Dopplers August 2021 showed no renal artery stenosis. Last seen by Dr. Stanford Breed 09/04/20, no changes to medical management.   IDDM2 followed by PCP Dr. Jeanie Cooks.  Will need DOS labs and evaluation.  EKG 04/17/2020: Normal sinus rhythm.  Rate 70.  RBBB.  LAFB. Left ventricular hypertrophy with repolarization abnormality ( R in aVL )  TTE 10/29/2019: 1. Left ventricular ejection fraction, by estimation, is 55 to 60%. The  left ventricle has normal function. The left ventricle has no regional  wall motion abnormalities. There is moderate left ventricular hypertrophy.  Left ventricular diastolic  parameters are indeterminate.   2. Right ventricular systolic function is normal. The right ventricular  size is normal. Tricuspid regurgitation signal is inadequate for assessing  PA pressure.   3. The mitral valve is grossly normal. Mild mitral valve regurgitation.   4. The aortic valve is tricuspid, mildly calcified. Aortic valve  regurgitation is not visualized.   5. The inferior vena cava is normal in size with greater than 50%  respiratory variability, suggesting right atrial pressure of 3 mmHg.   6. Trivial to small circumferential pericardial effusion.   Nuclear stress 07/07/2018: The left ventricular ejection fraction is normal (55-65%). Nuclear stress EF: 57%. No wall motion abnormalities There was no ST segment deviation noted during stress. Defect 1: There is a medium defect of mild severity present in the basal  inferior location. This appears secondary to diaphragmatic attenuation artifact This is a low risk study. No significant evidence of ischemia identified.   Wynonia Musty [facility]/Anesthesiology Phone [phone] 03/20/2021 11:26 AM

## 2021-03-20 NOTE — Progress Notes (Signed)
Spoke with Wife [name] [phone]) for PAT information and instructions for DOS.  DUE TO COVID-19 ONLY ONE VISITOR IS ALLOWED TO COME WITH YOU AND STAY IN THE WAITING ROOM ONLY DURING PRE OP AND PROCEDURE DAY OF SURGERY.   PCP - Dr Nolene Ebbs Cardiologist - Dr Kirk Ruths Nephrology -  Dr Harrie Jeans Pulmonology - Dr Christinia Gully  CT Chest x-ray - 05/2020 EKG - 04/17/20 Stress Test - 07/07/18 ECHO - 10/29/19 Cardiac Cath - n/a  ICD Pacemaker/Loop - n/a  Sleep Study -  n/a CPAP - none  Diabetes - Take 10 units of Toujeo on DOS.   Fasting sugar range is 100s-115s. If your blood sugar is less than 70 mg/dL, you will need to treat for low blood sugar: Treat a low blood sugar (less than 70 mg/dL) with  cup of clear juice (cranberry or apple), 4 glucose tablets, OR glucose gel. Recheck blood sugar in 15 minutes after treatment (to make sure it is greater than 70 mg/dL). If your blood sugar is not greater than 70 mg/dL on recheck, call [phone] for further instructions.  Anesthesia review: Yes  STOP now taking any Aspirin (unless otherwise instructed by your surgeon), Aleve, Naproxen, Ibuprofen, Motrin, Advil, Goody's, BC's, all herbal medications, fish oil, and all vitamins.   Coronavirus Screening Covid test n/a Ambulatory Surgery  Do you have any of the following symptoms:  Cough yes/no: No Fever (>100.60F)  yes/no: No Runny nose yes/no: No Sore throat yes/no: No Difficulty breathing/shortness of breath  yes/no: No  Have you traveled in the last 14 days and where? yes/no: No  Wife [name] verbalized understanding of instructions that were given via phone.

## 2021-03-21 ENCOUNTER — Other Ambulatory Visit: Payer: Self-pay

## 2021-03-21 ENCOUNTER — Encounter (HOSPITAL_COMMUNITY): Payer: Self-pay | Admitting: Vascular Surgery

## 2021-03-21 ENCOUNTER — Ambulatory Visit (HOSPITAL_COMMUNITY)
Admission: RE | Admit: 2021-03-21 | Discharge: 2021-03-21 | Disposition: A | Discharge status: Home / Self Care | Payer: Medicare HMO | Attending: Vascular Surgery | Admitting: Vascular Surgery

## 2021-03-21 ENCOUNTER — Ambulatory Visit (HOSPITAL_COMMUNITY): Payer: Medicare HMO | Admitting: Physician Assistant

## 2021-03-21 ENCOUNTER — Encounter (HOSPITAL_COMMUNITY)
Admission: RE | Disposition: A | Discharge status: Home / Self Care | Payer: Self-pay | Source: Home / Self Care | Attending: Vascular Surgery

## 2021-03-21 DIAGNOSIS — I132 Hypertensive heart and chronic kidney disease with heart failure and with stage 5 chronic kidney disease, or end stage renal disease: Secondary | ICD-10-CM | POA: Diagnosis not present

## 2021-03-21 DIAGNOSIS — Z794 Long term (current) use of insulin: Secondary | ICD-10-CM | POA: Insufficient documentation

## 2021-03-21 DIAGNOSIS — Z87891 Personal history of nicotine dependence: Secondary | ICD-10-CM | POA: Diagnosis not present

## 2021-03-21 DIAGNOSIS — N185 Chronic kidney disease, stage 5: Secondary | ICD-10-CM | POA: Insufficient documentation

## 2021-03-21 DIAGNOSIS — E1122 Type 2 diabetes mellitus with diabetic chronic kidney disease: Secondary | ICD-10-CM | POA: Diagnosis not present

## 2021-03-21 DIAGNOSIS — I3139 Other pericardial effusion (noninflammatory): Secondary | ICD-10-CM | POA: Insufficient documentation

## 2021-03-21 DIAGNOSIS — I340 Nonrheumatic mitral (valve) insufficiency: Secondary | ICD-10-CM | POA: Diagnosis not present

## 2021-03-21 DIAGNOSIS — I5030 Unspecified diastolic (congestive) heart failure: Secondary | ICD-10-CM | POA: Insufficient documentation

## 2021-03-21 HISTORY — PX: AV FISTULA PLACEMENT: SHX1204

## 2021-03-21 HISTORY — DX: Myoneural disorder, unspecified: G70.9

## 2021-03-21 HISTORY — DX: Anxiety disorder, unspecified: F41.9

## 2021-03-21 HISTORY — DX: Unspecified osteoarthritis, unspecified site: M19.90

## 2021-03-21 LAB — POCT I-STAT, CHEM 8
BUN: 69 mg/dL — ABNORMAL HIGH (ref 8–23)
Calcium, Ion: 0.93 mmol/L — ABNORMAL LOW (ref 1.15–1.40)
Chloride: 101 mmol/L (ref 98–111)
Creatinine, Ser: 5 mg/dL — ABNORMAL HIGH (ref 0.61–1.24)
Glucose, Bld: 114 mg/dL — ABNORMAL HIGH (ref 70–99)
HCT: 35 % — ABNORMAL LOW (ref 39.0–52.0)
Hemoglobin: 11.9 g/dL — ABNORMAL LOW (ref 13.0–17.0)
Potassium: 3.4 mmol/L — ABNORMAL LOW (ref 3.5–5.1)
Sodium: 142 mmol/L (ref 135–145)
TCO2: 29 mmol/L (ref 22–32)

## 2021-03-21 LAB — GLUCOSE, CAPILLARY
Glucose-Capillary: 109 mg/dL — ABNORMAL HIGH (ref 70–99)
Glucose-Capillary: 113 mg/dL — ABNORMAL HIGH (ref 70–99)
Glucose-Capillary: 123 mg/dL — ABNORMAL HIGH (ref 70–99)

## 2021-03-21 SURGERY — ARTERIOVENOUS (AV) FISTULA CREATION
Anesthesia: Monitor Anesthesia Care | Laterality: Left

## 2021-03-21 MED ORDER — 0.9 % SODIUM CHLORIDE (POUR BTL) OPTIME
TOPICAL | Status: DC | PRN
  Administered 2021-03-21: 11:00:00 1000 mL

## 2021-03-21 MED ORDER — SODIUM CHLORIDE 0.9 % IV SOLN: INTRAVENOUS | Status: DC

## 2021-03-21 MED ORDER — SODIUM CHLORIDE 0.9 % IV SOLN: INTRAVENOUS | Status: DC | PRN

## 2021-03-21 MED ORDER — HEPARIN 6000 UNIT IRRIGATION SOLUTION
Status: AC
  Filled 2021-03-21: qty 500

## 2021-03-21 MED ORDER — PROPOFOL 500 MG/50ML IV EMUL
INTRAVENOUS | Status: DC | PRN
  Administered 2021-03-21: 50 ug/kg/min via INTRAVENOUS

## 2021-03-21 MED ORDER — CHLORHEXIDINE GLUCONATE 0.12 % MT SOLN
15.0000 mL | Freq: Once | OROMUCOSAL | Status: AC
  Administered 2021-03-21: 08:00:00 15 mL via OROMUCOSAL
  Filled 2021-03-21: qty 15

## 2021-03-21 MED ORDER — FENTANYL CITRATE (PF) 100 MCG/2ML IJ SOLN: 100.0000 ug | Freq: Once | INTRAMUSCULAR | Status: AC

## 2021-03-21 MED ORDER — HEPARIN 6000 UNIT IRRIGATION SOLUTION
Status: DC | PRN
  Administered 2021-03-21: 1

## 2021-03-21 MED ORDER — CEFAZOLIN SODIUM-DEXTROSE 2-4 GM/100ML-% IV SOLN
2.0000 g | INTRAVENOUS | Status: AC
  Administered 2021-03-21: 10:00:00 2 g via INTRAVENOUS
  Filled 2021-03-21: qty 100

## 2021-03-21 MED ORDER — MIDAZOLAM HCL 2 MG/2ML IJ SOLN
INTRAMUSCULAR | Status: AC
  Filled 2021-03-21: qty 2

## 2021-03-21 MED ORDER — FENTANYL CITRATE (PF) 100 MCG/2ML IJ SOLN
INTRAMUSCULAR | Status: AC
  Administered 2021-03-21: 10:00:00 100 ug via INTRAVENOUS
  Filled 2021-03-21: qty 2

## 2021-03-21 MED ORDER — CHLORHEXIDINE GLUCONATE 4 % EX LIQD: 60.0000 mL | Freq: Once | CUTANEOUS | Status: DC

## 2021-03-21 MED ORDER — ORAL CARE MOUTH RINSE: 15.0000 mL | Freq: Once | OROMUCOSAL | Status: AC

## 2021-03-21 MED ORDER — FENTANYL CITRATE (PF) 100 MCG/2ML IJ SOLN: 25.0000 ug | INTRAMUSCULAR | Status: DC | PRN

## 2021-03-21 MED ORDER — HYDROCODONE-ACETAMINOPHEN 5-325 MG PO TABS: 1.0000 | ORAL_TABLET | ORAL | 0 refills | Status: DC | PRN

## 2021-03-21 MED ORDER — HEPARIN SODIUM (PORCINE) 1000 UNIT/ML IJ SOLN
INTRAMUSCULAR | Status: DC | PRN
  Administered 2021-03-21: 3000 [IU] via INTRAVENOUS

## 2021-03-21 MED ORDER — LIDOCAINE-EPINEPHRINE (PF) 1 %-1:200000 IJ SOLN
INTRAMUSCULAR | Status: AC
  Filled 2021-03-21: qty 30

## 2021-03-21 MED ORDER — PAPAVERINE HCL 30 MG/ML IJ SOLN
INTRAMUSCULAR | Status: AC
  Filled 2021-03-21: qty 2

## 2021-03-21 MED ORDER — MEPIVACAINE HCL (PF) 2 % IJ SOLN
INTRAMUSCULAR | Status: DC | PRN
  Administered 2021-03-21: 20 mL

## 2021-03-21 MED ORDER — ONDANSETRON HCL 4 MG/2ML IJ SOLN
INTRAMUSCULAR | Status: DC | PRN
  Administered 2021-03-21: 4 mg via INTRAVENOUS

## 2021-03-21 MED ORDER — LIDOCAINE HCL (CARDIAC) PF 100 MG/5ML IV SOSY
PREFILLED_SYRINGE | INTRAVENOUS | Status: DC | PRN
  Administered 2021-03-21: 100 mg via INTRATRACHEAL

## 2021-03-21 SURGICAL SUPPLY — 39 items
ADH SKN CLS APL DERMABOND .7 (GAUZE/BANDAGES/DRESSINGS) ×1
APL PRP STRL LF DISP 70% ISPRP (MISCELLANEOUS) ×1
APL SKNCLS STERI-STRIP NONHPOA (GAUZE/BANDAGES/DRESSINGS) ×1
ARMBAND PINK RESTRICT EXTREMIT (MISCELLANEOUS) ×2 IMPLANT
BENZOIN TINCTURE PRP APPL 2/3 (GAUZE/BANDAGES/DRESSINGS) ×2 IMPLANT
CANISTER SUCT 3000ML PPV (MISCELLANEOUS) ×2 IMPLANT
CANNULA VESSEL 3MM 2 BLNT TIP (CANNULA) ×2 IMPLANT
CHLORAPREP W/TINT 26 (MISCELLANEOUS) ×2 IMPLANT
CLIP LIGATING EXTRA MED SLVR (CLIP) ×2 IMPLANT
CLIP LIGATING EXTRA SM BLUE (MISCELLANEOUS) ×2 IMPLANT
CLIP VESOCCLUDE MED 6/CT (CLIP) IMPLANT
CLIP VESOCCLUDE SM WIDE 6/CT (CLIP) IMPLANT
COVER PROBE W GEL 5X96 (DRAPES) IMPLANT
DERMABOND ADVANCED (GAUZE/BANDAGES/DRESSINGS) ×1
DERMABOND ADVANCED .7 DNX12 (GAUZE/BANDAGES/DRESSINGS) IMPLANT
ELECT REM PT RETURN 9FT ADLT (ELECTROSURGICAL) ×2
ELECTRODE REM PT RTRN 9FT ADLT (ELECTROSURGICAL) ×1 IMPLANT
GLOVE SURG POLYISO LF SZ8 (GLOVE) ×2 IMPLANT
GOWN STRL REUS W/ TWL LRG LVL3 (GOWN DISPOSABLE) ×2 IMPLANT
GOWN STRL REUS W/ TWL XL LVL3 (GOWN DISPOSABLE) ×1 IMPLANT
GOWN STRL REUS W/TWL LRG LVL3 (GOWN DISPOSABLE) ×4
GOWN STRL REUS W/TWL XL LVL3 (GOWN DISPOSABLE) ×2
INSERT FOGARTY SM (MISCELLANEOUS) IMPLANT
KIT BASIN OR (CUSTOM PROCEDURE TRAY) ×2 IMPLANT
KIT TURNOVER KIT B (KITS) ×2 IMPLANT
NDL 18GX1X1/2 (RX/OR ONLY) (NEEDLE) IMPLANT
NEEDLE 18GX1X1/2 (RX/OR ONLY) (NEEDLE) IMPLANT
NS IRRIG 1000ML POUR BTL (IV SOLUTION) ×2 IMPLANT
PACK CV ACCESS (CUSTOM PROCEDURE TRAY) ×2 IMPLANT
PAD ARMBOARD 7.5X6 YLW CONV (MISCELLANEOUS) ×4 IMPLANT
STRIP CLOSURE SKIN 1/2X4 (GAUZE/BANDAGES/DRESSINGS) ×2 IMPLANT
SUT MNCRL AB 4-0 PS2 18 (SUTURE) ×2 IMPLANT
SUT PROLENE 6 0 BV (SUTURE) ×3 IMPLANT
SUT VIC AB 3-0 SH 27 (SUTURE) ×2
SUT VIC AB 3-0 SH 27X BRD (SUTURE) ×1 IMPLANT
SYR 3ML LL SCALE MARK (SYRINGE) IMPLANT
TOWEL GREEN STERILE (TOWEL DISPOSABLE) ×2 IMPLANT
UNDERPAD 30X36 HEAVY ABSORB (UNDERPADS AND DIAPERS) ×2 IMPLANT
WATER STERILE IRR 1000ML POUR (IV SOLUTION) ×2 IMPLANT

## 2021-03-21 NOTE — Anesthesia Procedure Notes (Signed)
Procedure Name: MAC Date/Time: 03/21/2021 10:16 AM Performed by: Claris Che, CRNA Pre-anesthesia Checklist: Patient identified, Emergency Drugs available, Suction available, Patient being monitored and Timeout performed Patient Re-evaluated:Patient Re-evaluated prior to induction Oxygen Delivery Method: Simple face mask Dental Injury: Teeth and Oropharynx as per pre-operative assessment

## 2021-03-21 NOTE — Interval H&P Note (Signed)
History and Physical Interval Note:  03/21/2021 9:48 AM  Melvin Mcclure  has presented today for surgery, with the diagnosis of ESRD.  The various methods of treatment have been discussed with the patient and family. After consideration of risks, benefits and other options for treatment, the patient has consented to  Procedure(s): LEFT BRACHIOCEPHALIC ARTERIOVENOUS (AV) FISTULA CREATION (Left) as a surgical intervention.  The patient's history has been reviewed, patient examined, no change in status, stable for surgery.  I have reviewed the patient's chart and labs.  Questions were answered to the patient's satisfaction.     Cherre Robins

## 2021-03-21 NOTE — Anesthesia Procedure Notes (Signed)
Anesthesia Regional Block: Supraclavicular block   Pre-Anesthetic Checklist: , timeout performed,  Correct Patient, Correct Site, Correct Laterality,  Correct Procedure, Correct Position, site marked,  Risks and benefits discussed,  Surgical consent,  Pre-op evaluation,  At surgeon's request and post-op pain management  Laterality: Left  Prep: Maximum Sterile Barrier Precautions used, chloraprep       Needles:  Injection technique: Single-shot  Needle Type: Echogenic Stimulator Needle     Needle Length: 5cm  Needle Gauge: 22     Additional Needles:   Procedures:,,,, ultrasound used (permanent image in chart),,    Narrative:  Start time: 03/21/2021 9:53 AM End time: 03/21/2021 9:56 AM Injection made incrementally with aspirations every 5 mL.  Performed by: Personally  Anesthesiologist: Freddrick March, MD  Additional Notes: Monitors applied. No increased pain on injection. No increased resistance to injection. Injection made in 5cc increments. Good needle visualization. Patient tolerated procedure well.

## 2021-03-21 NOTE — Discharge Instructions (Signed)
° °  [facility] ° °Discharge Instructions ° °AV Fistula or Graft Surgery for Dialysis Access ° °Please refer to the following instructions for your post-procedure care. Your surgeon or physician assistant will discuss any changes with you. ° °Activity ° °You may drive the day following your surgery, if you are comfortable and no longer taking prescription pain medication. Resume full activity as the soreness in your incision resolves. ° °Bathing/Showering ° °You may shower after you go home. Keep your incision dry for 48 hours. Do not soak in a bathtub, hot tub, or swim until the incision heals completely. You may not shower if you have a hemodialysis catheter. ° °Incision Care ° °Clean your incision with mild soap and water after 48 hours. Pat the area dry with a clean towel. You do not need a bandage unless otherwise instructed. Do not apply any ointments or creams to your incision. You may have skin glue on your incision. Do not peel it off. It will come off on its own in about one week. Your arm may swell a bit after surgery. To reduce swelling use pillows to elevate your arm so it is above your heart. Your doctor will tell you if you need to lightly wrap your arm with an ACE bandage. ° °Diet ° °Resume your normal diet. There are not special food restrictions following this procedure. In order to heal from your surgery, it is CRITICAL to get adequate nutrition. Your body requires vitamins, minerals, and protein. Vegetables are the best source of vitamins and minerals. Vegetables also provide the perfect balance of protein. Processed food has little nutritional value, so try to avoid this. ° °Medications ° °Resume taking all of your medications. If your incision is causing pain, you may take over-the counter pain relievers such as acetaminophen (Tylenol). If you were prescribed a stronger pain medication, please be aware these medications can cause nausea and constipation. Prevent  nausea by taking the medication with a snack or meal. Avoid constipation by drinking plenty of fluids and eating foods with high amount of fiber, such as fruits, vegetables, and grains. Do not take Tylenol if you are taking prescription pain medications. ° ° ° ° °Follow up °Your surgeon may want to see you in the office following your access surgery. If so, this will be arranged at the time of your surgery. ° °Please call us immediately for any of the following conditions: ° °Increased pain, redness, drainage (pus) from your incision site °Fever of 101 degrees or higher °Severe or worsening pain at your incision site °Hand pain or numbness. ° °Reduce your risk of vascular disease: ° °Stop smoking. If you would like help, call [facility] at 1-800-QUIT-NOW (1-800-784-8669) or [place] at 336-586-4000 ° °Manage your cholesterol °Maintain a desired weight °Control your diabetes °Keep your blood pressure down ° °Dialysis ° °It will take several weeks to several months for your new dialysis access to be ready for use. Your surgeon will determine when it is OK to use it. Your nephrologist will continue to direct your dialysis. You can continue to use your Permcath until your new access is ready for use. ° °If you have any questions, please call the office at [phone]. ° °

## 2021-03-21 NOTE — Transfer of Care (Signed)
Immediate Anesthesia Transfer of Care Note  Patient: Melvin Mcclure  Procedure(s) Performed: LEFT BRACHIOCEPHALIC ARTERIOVENOUS (AV) FISTULA CREATION (Left)  Patient Location: PACU  Anesthesia Type:MAC and Regional  Level of Consciousness: awake, alert  and oriented  Airway & Oxygen Therapy: Patient Spontanous Breathing and Patient connected to nasal cannula oxygen  Post-op Assessment: Report given to RN and Post -op Vital signs reviewed and stable  Post vital signs: Reviewed and stable  Last Vitals:  Vitals Value Taken Time  BP    Temp    Pulse 60 03/21/21 1140  Resp 20 03/21/21 1140  SpO2 90 % 03/21/21 1140  Vitals shown include unvalidated device data.  Last Pain:  Vitals:   03/21/21 1000  TempSrc:   PainSc: 0-No pain      Patients Stated Pain Goal: 3 (53/97/67 3419)  Complications: No notable events documented.

## 2021-03-21 NOTE — Op Note (Signed)
DATE OF SERVICE: 03/21/2021  PATIENT:  Melvin Mcclure  70 y.o. male  PRE-OPERATIVE DIAGNOSIS:  CKD V  POST-OPERATIVE DIAGNOSIS:  Same  PROCEDURE:   Left brachiocephalic arteriovenous fistula creation  SURGEON:  Surgeon(s) and Role:    * Cherre Robins, MD - Primary  ASSISTANT: Risa Grill, PA-C  An assistant was required to facilitate exposure and expedite the case.  ANESTHESIA:   regional and MAC  EBL: 79m  BLOOD ADMINISTERED:none  DRAINS: none   LOCAL MEDICATIONS USED:  NONE  SPECIMEN:  none  COUNTS: confirmed correct.  TOURNIQUET:  none  PATIENT DISPOSITION:  PACU - hemodynamically stable.   Delay start of Pharmacological VTE agent (>24hrs) due to surgical blood loss or risk of bleeding: no  INDICATION FOR PROCEDURE: CHays Dunniganis a 70y.o. male with CKD V in need of permanent dialysis access. After careful discussion of risks, benefits, and alternatives the patient was offered left brachiocephalic arteriovenous creation. The patient understood and wished to proceed.  OPERATIVE FINDINGS: healthy cephalic vein and brachial artery. Unremarkable AVF.  DESCRIPTION OF PROCEDURE: After identification of the patient in the pre-operative holding area, the patient was transferred to the operating room. The patient was positioned supine on the operating room table. Anesthesia was induced. The left arm was prepped and draped in standard fashion. A surgical pause was performed confirming correct patient, procedure, and operative location.  Using intraoperative ultrasound, the course of the left upper extremity superficial veins was mapped.  The cephalic vein appeared adequate for arteriovenous fistula creation.  The brachial artery was similarly mapped.  The artery appeared adequate for arterial venous creation. We ensured there was no anomalous arterial anatomy such as a high bifurcation.  A transverse incision was made in the left arm just distal to the antecubital  crease.  Incision was carried down through subcutaneous tissue until the cephalic vein was identified and skeletonized.  We continued our exposure through the aponeurosis of the biceps.  The brachial artery was encountered its usual position.  The artery was circumferentially exposed and encircled with Silastic Vesseloops.  Patient was systemically heparinized.  The distal cephalic vein was transected.  The distal stump of the cephalic vein was oversewn with a 2-0 silk suture.  The proximal vein was controlled with a bulldog clamp.  The brachial artery was clamped proximally medially.  An anterior arteriotomy was made with a 11 blade.  The arteriotomy was extended with Potts scissors.  Using a parachute technique the cephalic vein was anastomosed to the brachial arteriotomy in end-to-side fashion with continuous running suture of 6-0 Prolene.  Immediately prior to completion the anastomosis was flushed and de-aired.  The anastomosis was completed.  Hemostasis was assured.  The fistula was interrogated with Doppler. Audible bruit was heard throughout the course of the cephalic vein.  A left radial artery signal was heard which augmented slightly with compression of the fistula.  Upon completion of the case instrument and sharps counts were confirmed correct. The patient was transferred to the PACU in good condition. I was present for all portions of the procedure.  [name] MD [facility]Phone Number: [phone]/17/2022 11:22 AM

## 2021-03-22 ENCOUNTER — Encounter (HOSPITAL_COMMUNITY): Payer: Self-pay | Admitting: Vascular Surgery

## 2021-03-22 NOTE — Anesthesia Postprocedure Evaluation (Signed)
Anesthesia Post Note  Patient: Jarrah Babich  Procedure(s) Performed: LEFT BRACHIOCEPHALIC ARTERIOVENOUS (AV) FISTULA CREATION (Left)     Patient location during evaluation: PACU Anesthesia Type: Regional and MAC Level of consciousness: awake and alert Pain management: pain level controlled Vital Signs Assessment: post-procedure vital signs reviewed and stable Respiratory status: spontaneous breathing, nonlabored ventilation, respiratory function stable and patient connected to nasal cannula oxygen Cardiovascular status: stable and blood pressure returned to baseline Postop Assessment: no apparent nausea or vomiting Anesthetic complications: no   No notable events documented.  Last Vitals:  Vitals:   03/21/21 1155 03/21/21 1210  BP: 140/68 (!) 150/81  Pulse: 60 63  Resp: 18 17  Temp:  36.4 C  SpO2: 95% 90%    Last Pain:  Vitals:   03/21/21 1210  TempSrc:   PainSc: 0-No pain   Pain Goal: Patients Stated Pain Goal: 3 (03/21/21 0813)                 [place]

## 2021-03-26 ENCOUNTER — Other Ambulatory Visit: Payer: Self-pay | Admitting: Internal Medicine

## 2021-03-27 LAB — BASIC METABOLIC PANEL WITH GFR
BUN/Creatinine Ratio: 18 (calc) (ref 6–22)
BUN: 88 mg/dL — ABNORMAL HIGH (ref 7–25)
CO2: 31 mmol/L (ref 20–32)
Calcium: 9.1 mg/dL (ref 8.6–10.3)
Chloride: 97 mmol/L — ABNORMAL LOW (ref 98–110)
Creat: 5.02 mg/dL — ABNORMAL HIGH (ref 0.70–1.28)
Glucose, Bld: 99 mg/dL (ref 65–99)
Potassium: 3.8 mmol/L (ref 3.5–5.3)
Sodium: 141 mmol/L (ref 135–146)
eGFR: 12 mL/min/{1.73_m2} — ABNORMAL LOW (ref >=60)

## 2021-03-27 LAB — TIQ-MISC

## 2021-03-27 LAB — BRAIN NATRIURETIC PEPTIDE

## 2021-04-11 ENCOUNTER — Other Ambulatory Visit: Payer: Self-pay

## 2021-04-11 DIAGNOSIS — E1122 Type 2 diabetes mellitus with diabetic chronic kidney disease: Secondary | ICD-10-CM

## 2021-04-11 NOTE — Progress Notes (Deleted)
Cardiology Office Note   Date:  04/11/2021   ID:  Melvin Mcclure, DOB [birth year], MRN [id number]  PCP:  Nolene Ebbs, MD  Cardiologist: Dr. Stanford Breed No chief complaint on file.    History of Present Illness: Melvin Mcclure is a 70 y.o. male who presents for ongoing assessment and management of chronic diastolic CHF, with history of chronic kidney disease stage IV.  Renal Dopplers in August 2021 showed no renal artery stenosis.  Chest CT in January 2022 showed a right pleural effusion and consolidation collapse of the right lower lobe.  A PYP scan in August 2021 was negative for transthyretin amyloidosis.    Echocardiogram in June 2021 showed an ejection fraction of 55 to 60% with moderate left ventricular hypertrophy, mild mitral regurgitation, and small pericardial effusion.  He was last seen by Dr. Stanford Breed on 09/04/2020.  He was found to be euvolemic blood pressure was well controlled and was to continue to see nephrology for ongoing management of chronic kidney disease.  It is noted that on 03/21/2021 the patient had a left brachiocephalic AV fistula creation by Dr. Jamelle Haring.   Past Medical History:  Diagnosis Date   Anemia in chronic kidney disease (CKD)    stage 5   Anxiety    Arthritis    CHF (congestive heart failure) ([place]) 05/28/2018   dx 05/28/18   Diabetes mellitus without complication (HCC)    Hyperlipidemia    Hypertension    Iron deficiency anemia    Neuromuscular disorder (HCC)    neuropathy feet/hands   Perforating neurotrophic ulcer of foot ([place])     Past Surgical History:  Procedure Laterality Date   ACHILLES TENDON REPAIR Bilateral    AV FISTULA PLACEMENT Left 03/21/2021   Procedure: LEFT BRACHIOCEPHALIC ARTERIOVENOUS (AV) FISTULA CREATION;  Surgeon: Cherre Robins, MD;  Location: [facility];  Service: Vascular;  Laterality: Left;   COLONOSCOPY     CYST EXCISION     lipoma removed from arm   IR THORACENTESIS ASP PLEURAL SPACE W/IMG GUIDE  12/26/2019   IR  THORACENTESIS ASP PLEURAL SPACE W/IMG GUIDE  01/18/2020   THORACENTESIS  04/16/2020     Current Outpatient Medications  Medication Sig Dispense Refill   amLODipine (NORVASC) 10 MG tablet Take 10 mg by mouth daily.     aspirin 81 MG tablet Take 81 mg by mouth daily.     blood glucose meter kit and supplies KIT Dispense based on patient and insurance preference. Use up to four times daily as directed. (FOR ICD-9 250.00, 250.01). 1 each 0   calcitRIOL (ROCALTROL) 0.25 MCG capsule Take 0.25 mcg by mouth daily. Pt takes 1 tablet daily     calcium acetate (PHOSLO) 667 MG capsule Take 1 capsule (667 mg total) by mouth 3 (three) times daily with meals. 90 capsule 1   carvedilol (COREG) 12.5 MG tablet TAKE 1 TABLET BY MOUTH TWICE DAILY WITH A MEAL 180 tablet 1   diclofenac Sodium (VOLTAREN) 1 % GEL Apply 2 g topically 4 (four) times daily.     ferrous sulfate 325 (65 FE) MG tablet Take 325 mg by mouth daily with breakfast.     gabapentin (NEURONTIN) 300 MG capsule Take 1 capsule (300 mg total) by mouth 2 (two) times daily. 60 capsule 0   hydrALAZINE (APRESOLINE) 50 MG tablet Take 1 tablet (50 mg total) by mouth every 8 (eight) hours. (Patient taking differently: Take 50 mg by mouth 3 (three) times daily. Pt takes 1 tablet daily) 90  tablet 0   HYDROcodone-acetaminophen (NORCO/VICODIN) 5-325 MG tablet Take 1 tablet by mouth every 4 (four) hours as needed for moderate pain. 10 tablet 0   hydrOXYzine (ATARAX/VISTARIL) 25 MG tablet Take 25 mg by mouth 2 (two) times daily as needed for anxiety.     insulin glargine (LANTUS SOLOSTAR) 100 UNIT/ML Solostar Pen Inject into the skin.     Insulin Glargine (TOUJEO MAX SOLOSTAR [place]) Inject 20 Units into the skin every morning.     isosorbide mononitrate (IMDUR) 60 MG 24 hr tablet Take 1 tablet by mouth in the evening 90 tablet 0   meclizine (ANTIVERT) 25 MG tablet Take 1 tablet (25 mg total) by mouth 3 (three) times daily. 30 tablet 0   metolazone (ZAROXOLYN) 5 MG  tablet 5 mg once weekly and as needed on Sunday     senna-docusate (SENOKOT-S) 8.6-50 MG tablet Take 1 tablet by mouth 2 (two) times daily as needed for mild constipation.      torsemide (DEMADEX) 20 MG tablet Take 40 mg by mouth 2 (two) times daily.     No current facility-administered medications for this visit.    Allergies:   Patient has no known allergies.    Social History:  The patient  reports that he has quit smoking. His smoking use included cigarettes. He has a 1.50 pack-year smoking history. He has never used smokeless tobacco. He reports that he does not drink alcohol and does not use drugs.   Family History:  The patient's family history includes Diabetes Mellitus II in his mother; Hypertension in his father.    ROS: All other systems are reviewed and negative. Unless otherwise mentioned in H&P    PHYSICAL EXAM: VS:  There were no vitals taken for this visit. , BMI There is no height or weight on file to calculate BMI. GEN: Well nourished, well developed, in no acute distress HEENT: normal Neck: no JVD, carotid bruits, or masses Cardiac: ***RRR; no murmurs, rubs, or gallops,no edema  Respiratory:  Clear to auscultation bilaterally, normal work of breathing GI: soft, nontender, nondistended, + BS MS: no deformity or atrophy Skin: warm and dry, no rash Neuro:  Strength and sensation are intact Psych: euthymic mood, full affect   EKG:  EKG {ACTION; IS/IS QMG:[id number]} ordered today. The ekg ordered today demonstrates ***   Recent Labs: 11/29/2020: ALT 11; TSH 4.81 01/22/2021: Platelets 225 03/21/2021: Hemoglobin 11.9 03/26/2021: Brain Natriuretic Peptide CANCELED; BUN 88; Creat 5.02; Potassium 3.8; Sodium 141    Lipid Panel    Component Value Date/Time   CHOL 178 11/29/2020 0948   TRIG 130 11/29/2020 0948   HDL 35 (L) 11/29/2020 0948   CHOLHDL 5.1 (H) 11/29/2020 0948   LDLCALC 118 (H) 11/29/2020 0948      Wt Readings from Last 3 Encounters:  03/21/21  210 lb (95.3 kg)  03/12/21 218 lb (98.9 kg)  09/04/20 207 lb (93.9 kg)      Other studies Reviewed: Echocardiogram November 05, 2019 1. Left ventricular ejection fraction, by estimation, is 55 to 60%. The  left ventricle has normal function. The left ventricle has no regional  wall motion abnormalities. There is moderate left ventricular hypertrophy.  Left ventricular diastolic  parameters are indeterminate.   2. Right ventricular systolic function is normal. The right ventricular  size is normal. Tricuspid regurgitation signal is inadequate for assessing  PA pressure.   3. The mitral valve is grossly normal. Mild mitral valve regurgitation.   4. The aortic valve is tricuspid, mildly  calcified. Aortic valve  regurgitation is not visualized.   5. The inferior vena cava is normal in size with greater than 50%  respiratory variability, suggesting right atrial pressure of 3 mmHg.   6. Trivial to small circumferential pericardial effusion.   NM Cardiac Amyloid 12/26/2019 IMPRESSION: Visual and quantitative assessment (grade 1, H/CLL equal 1.1) are NOT suggestive of transthyretin amyloidosis.   NM Stress Test 07/07/2018 The left ventricular ejection fraction is normal (55-65%). Nuclear stress EF: 57%. No wall motion abnormalities There was no ST segment deviation noted during stress. Defect 1: There is a medium defect of mild severity present in the basal inferior location. This appears secondary to diaphragmatic attenuation artifact This is a low risk study. No significant evidence of ischemia identified.   The left ventricular ejection fraction is normal (55-65%). Nuclear stress EF: 57%. No wall motion abnormalities There was no ST segment deviation noted during stress. Defect 1: There is a medium defect of mild severity present in the basal inferior location. This appears secondary to diaphragmatic attenuation artifact This is a low risk study. No significant evidence of ischemia  identified.    ASSESSMENT AND PLAN:  1.  ***   Current medicines are reviewed at length with the patient today.  I have spent *** dedicated to the care of this patient on the date of this encounter to include pre-visit review of records, assessment, management and diagnostic testing,with shared decision making.  Labs/ tests ordered today include: *** Phill Myron. West Pugh, ANP, [facility]   04/11/2021 10:57 AM    [facility] [place] [address] Office [phone] Fax [phone]  Notice: This dictation was prepared with Dragon dictation along with smaller phrase technology. Any transcriptional errors that result from this process are unintentional and may not be corrected upon review.

## 2021-04-12 ENCOUNTER — Ambulatory Visit: Payer: Medicare HMO | Admitting: Adult Health

## 2021-04-28 IMAGING — CT CT CHEST W/O CM
2 of 4 series · 15 of 36 positions shown, 18 images · non-contrast
Comparison: April 18, 2020

CLINICAL DATA: Pneumonia follow-up.  Multiple prior thoracentesis.

EXAM:
CT CHEST WITHOUT CONTRAST
TECHNIQUE: Multidetector CT imaging of the chest was performed following the
standard protocol without IV contrast.

[Series 2: chest 2.00 br40 s3 · axial · 0.77mm/px · z∈[+1679,+1965]mm · 12 of 170 slices shown, 15 images (1 of 2)]
[im 14/170  mediastinal]
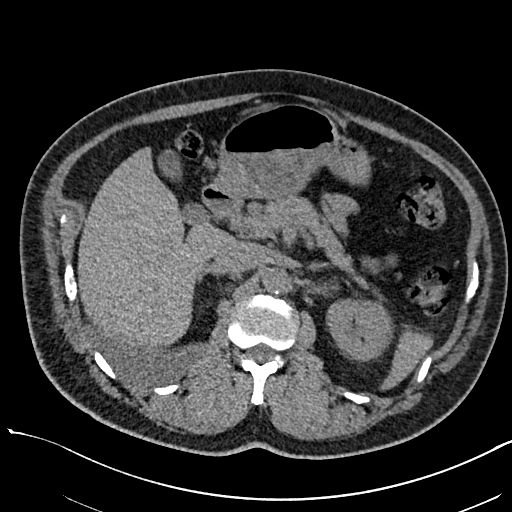
[im 14/170  lung]
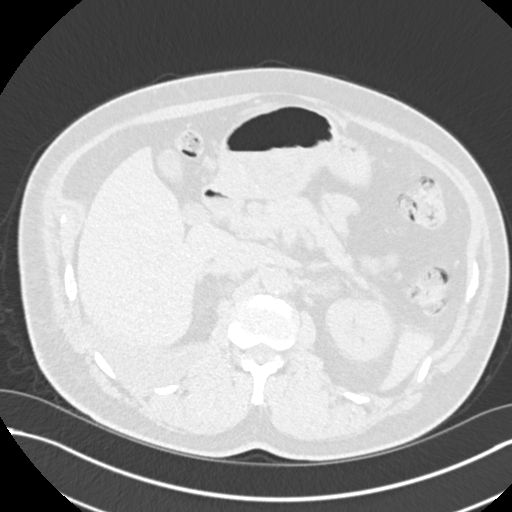
[im 27/170  lung]
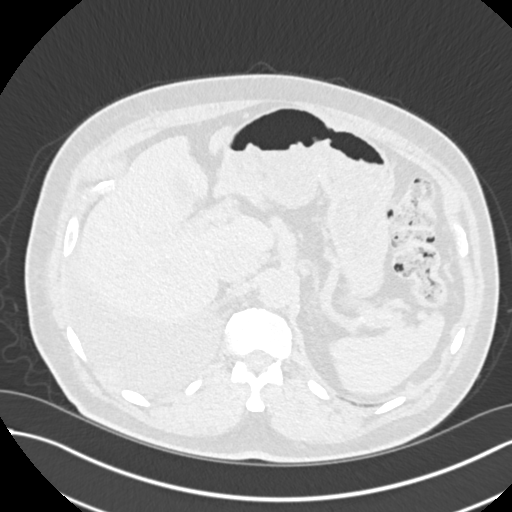
[im 40/170  lung]
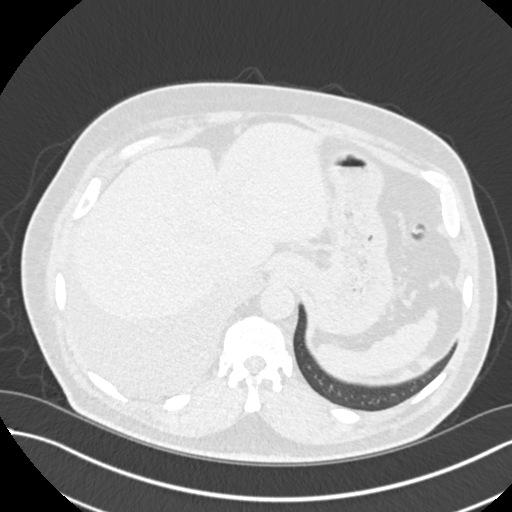
[im 53/170  lung]
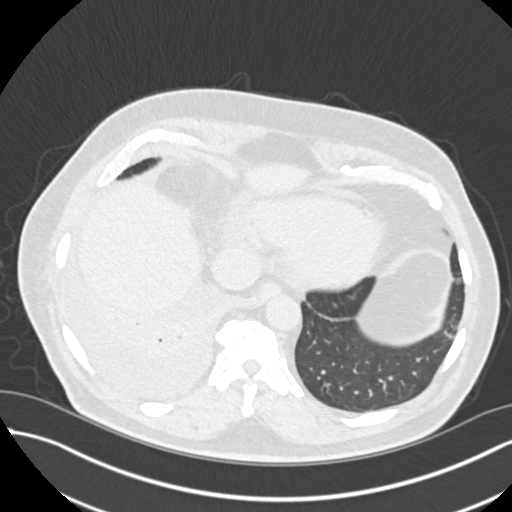
[im 66/170  mediastinal]
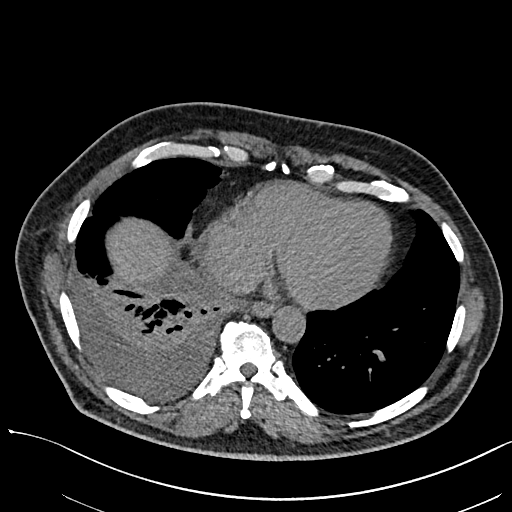
[im 66/170  lung]
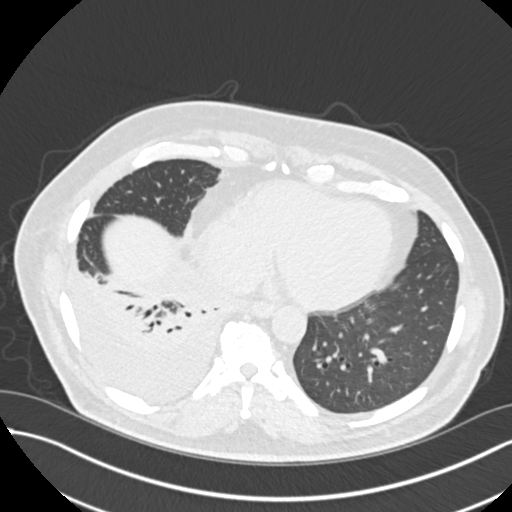
[im 79/170  lung]
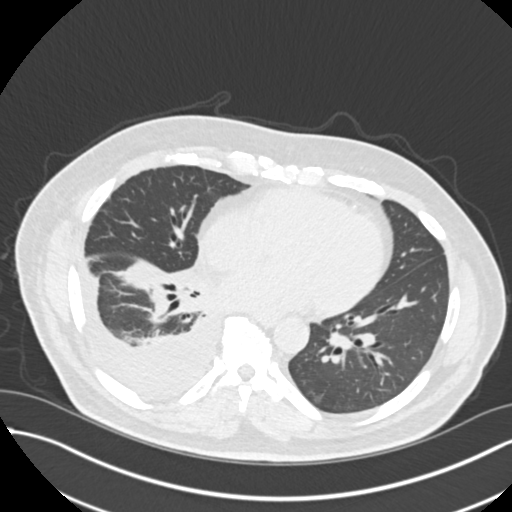
[im 92/170  lung]
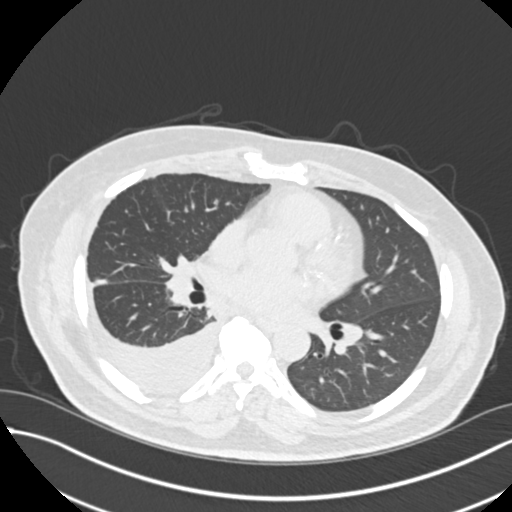
[im 105/170  lung]
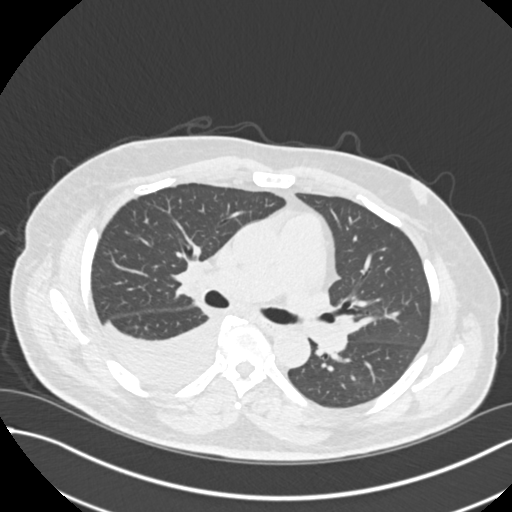
[im 118/170  mediastinal]
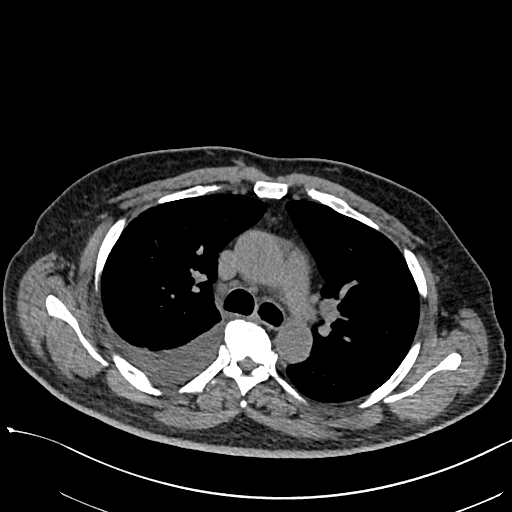
[im 118/170  lung]
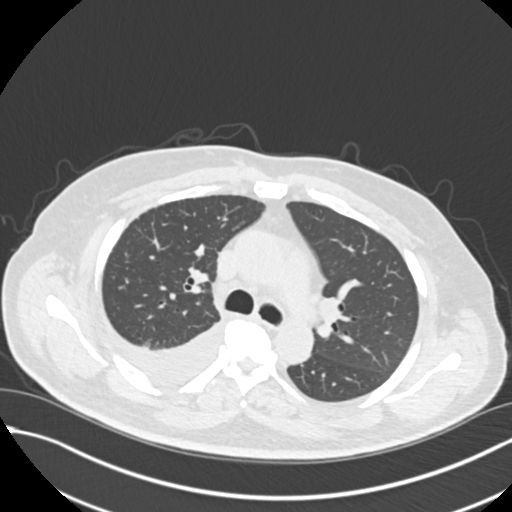
[im 131/170  lung]
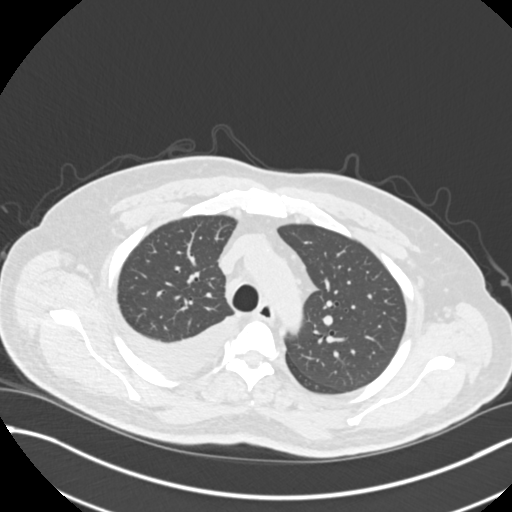
[im 144/170  lung]
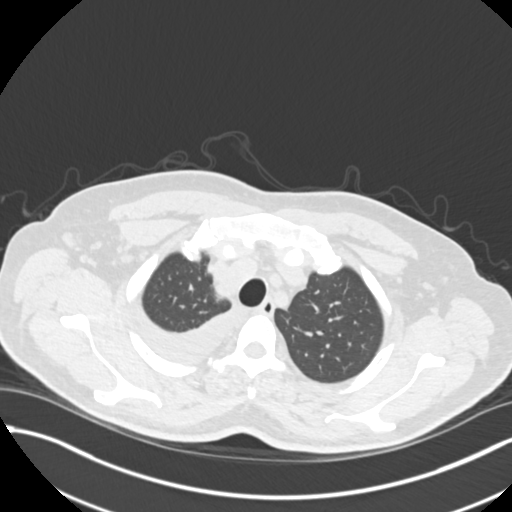
[im 157/170  lung]
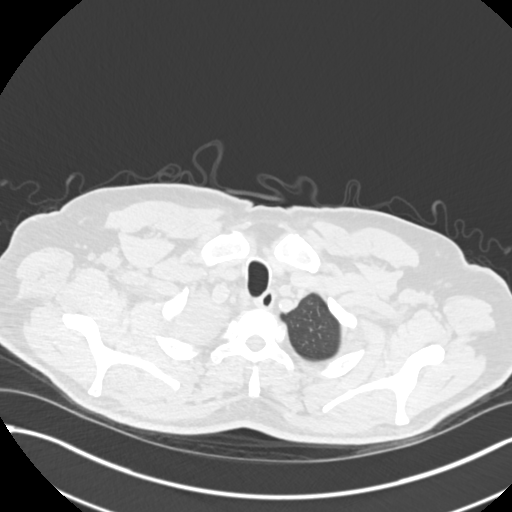

[Series 4: chest 2.00 br40 s3 · coronal · 0.67mm/px · 3 of 197 slices shown (2 of 2)]
[im 40/197  lung]
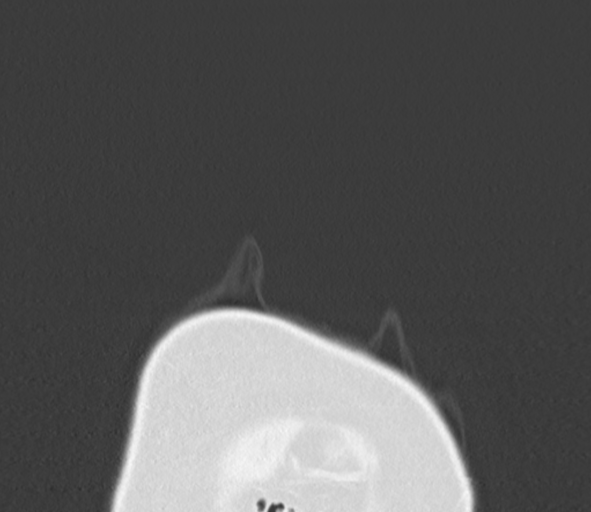
[im 79/197  lung]
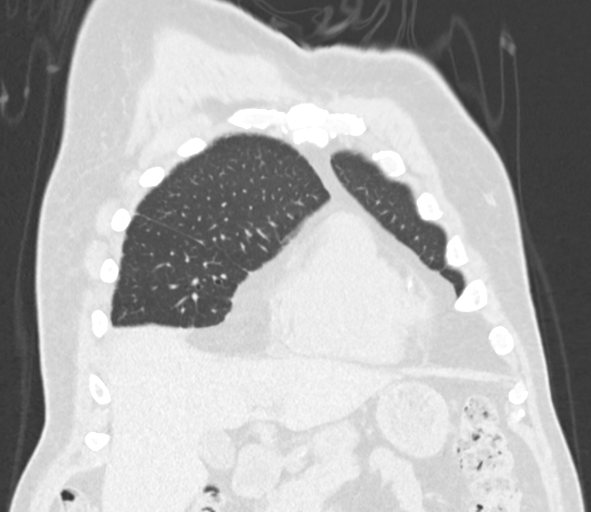
[im 118/197  lung]
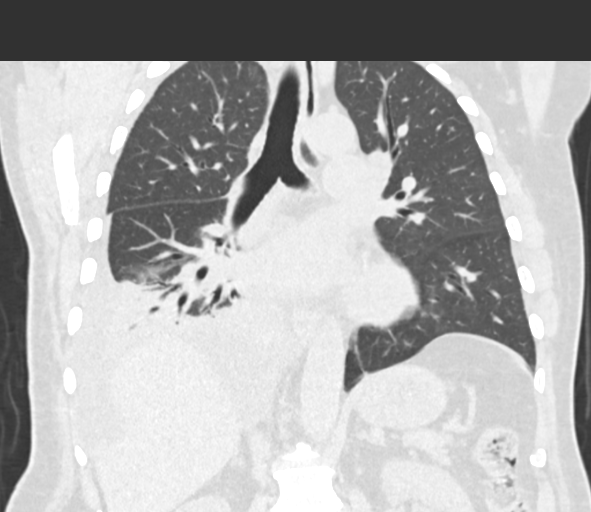

[15 of 36 positions shown; findings below may reference images not displayed]

FINDINGS: Cardiovascular: The heart size is mildly enlarged. Coronary artery
calcifications are noted. There is no significant pericardial
effusion. No evidence for thoracic aortic aneurysm. Mild
atherosclerotic changes are noted of the thoracic aorta.

Mediastinum/Nodes:

--mild mediastinal adenopathy is noted, not substantially changed
from prior study.

--mild hilar adenopathy is noted, not substantially changed from
prior study.

-- No axillary lymphadenopathy.

-- No supraclavicular lymphadenopathy.

-- Normal thyroid gland where visualized.

-  Unremarkable esophagus.

Lungs/Pleura: There is a moderate-sized right-sided pleural
effusion, slightly increased since the patient's prior study. There
is persistent consolidation involving the right lower lobe with
associated partial collapse, minimally improved from prior study.
There is no convincing endobronchial filling defect. There are few
ground-glass airspace opacities in the left lower lobe.

Upper Abdomen: There is no acute abnormality identified within the
upper abdomen.

Musculoskeletal: No chest wall abnormality. No bony spinal canal
stenosis.
IMPRESSION: 1. Persistent moderate-sized right-sided pleural effusion, increased
in size from prior study.
2. Persistent partial consolidation/collapse of the right lower lobe
without a clear identifiable cause. An underlying mass is difficult
to exclude in the collapsed lung, especially in the absence of IV
contrast. Consider repeat examination with IV contrast and
outpatient pulmonary medicine follow-up.

Aortic Atherosclerosis ([id number]-FWL.L).

## 2021-04-30 ENCOUNTER — Other Ambulatory Visit: Payer: Self-pay

## 2021-04-30 ENCOUNTER — Ambulatory Visit (HOSPITAL_COMMUNITY)
Admission: RE | Admit: 2021-04-30 | Discharge: 2021-04-30 | Disposition: A | Discharge status: Home / Self Care | Payer: Medicare HMO | Source: Ambulatory Visit | Attending: Vascular Surgery | Admitting: Vascular Surgery

## 2021-04-30 ENCOUNTER — Ambulatory Visit (INDEPENDENT_AMBULATORY_CARE_PROVIDER_SITE_OTHER): Payer: Medicare HMO | Admitting: Physician Assistant

## 2021-04-30 VITALS — BP 136/70 | HR 69 | Temp 98.8°F | Wt 218.7 lb

## 2021-04-30 DIAGNOSIS — E1122 Type 2 diabetes mellitus with diabetic chronic kidney disease: Secondary | ICD-10-CM

## 2021-04-30 DIAGNOSIS — N184 Chronic kidney disease, stage 4 (severe): Secondary | ICD-10-CM

## 2021-04-30 NOTE — Progress Notes (Signed)
°POST OPERATIVE OFFICE NOTE ° ° ° °CC:  F/u for surgery ° °HPI:  This is a 70 y.o. male who is s/p left BC AVF on 03/21/2021 by Dr. Hawken.  ° °Pt states he does not have pain/numbness in the left hand.   He does have an occasional sharp pain to the left hand and he also has neuropathy from his diabetes.   ° °The pt is not on dialysis and recently had appointment to start the transplant process.  ° °Pt comes in today accompanied by his wife and their 47th wedding anniversary is New Years Eve.  He received his Masters degree from [employer] in adult education. ° ° °No Known Allergies ° °Current Outpatient Medications  °Medication Sig Dispense Refill  ° amLODipine (NORVASC) 10 MG tablet Take 10 mg by mouth daily.    ° aspirin 81 MG tablet Take 81 mg by mouth daily.    ° blood glucose meter kit and supplies KIT Dispense based on patient and insurance preference. Use up to four times daily as directed. (FOR ICD-9 250.00, 250.01). 1 each 0  ° calcitRIOL (ROCALTROL) 0.25 MCG capsule Take 0.25 mcg by mouth daily. Pt takes 1 tablet daily    ° calcium acetate (PHOSLO) 667 MG capsule Take 1 capsule (667 mg total) by mouth 3 (three) times daily with meals. 90 capsule 1  ° carvedilol (COREG) 12.5 MG tablet TAKE 1 TABLET BY MOUTH TWICE DAILY WITH A MEAL 180 tablet 1  ° diclofenac Sodium (VOLTAREN) 1 % GEL Apply 2 g topically 4 (four) times daily.    ° ferrous sulfate 325 (65 FE) MG tablet Take 325 mg by mouth daily with breakfast.    ° gabapentin (NEURONTIN) 300 MG capsule Take 1 capsule (300 mg total) by mouth 2 (two) times daily. 60 capsule 0  ° hydrALAZINE (APRESOLINE) 50 MG tablet Take 1 tablet (50 mg total) by mouth every 8 (eight) hours. (Patient taking differently: Take 50 mg by mouth 3 (three) times daily. Pt takes 1 tablet daily) 90 tablet 0  ° HYDROcodone-acetaminophen (NORCO/VICODIN) 5-325 MG tablet Take 1 tablet by mouth every 4 (four) hours as needed for moderate pain. 10 tablet 0  ° hydrOXYzine (ATARAX/VISTARIL) 25 MG  tablet Take 25 mg by mouth 2 (two) times daily as needed for anxiety.    ° Insulin Glargine (TOUJEO MAX SOLOSTAR [place]) Inject 20 Units into the skin every morning.    ° isosorbide mononitrate (IMDUR) 60 MG 24 hr tablet Take 1 tablet by mouth in the evening 90 tablet 0  ° meclizine (ANTIVERT) 25 MG tablet Take 1 tablet (25 mg total) by mouth 3 (three) times daily. 30 tablet 0  ° metolazone (ZAROXOLYN) 5 MG tablet 5 mg once weekly and as needed on Sunday    ° senna-docusate (SENOKOT-S) 8.6-50 MG tablet Take 1 tablet by mouth 2 (two) times daily as needed for mild constipation.     ° torsemide (DEMADEX) 20 MG tablet Take 40 mg by mouth 2 (two) times daily.    ° insulin glargine (LANTUS SOLOSTAR) 100 UNIT/ML Solostar Pen Inject into the skin. (Patient not taking: Reported on 04/30/2021)    ° °No current facility-administered medications for this visit.  ° ° ° ROS:  See HPI ° °Physical Exam: ° °Today's Vitals  ° 04/30/21 1328  °BP: 136/70  °Pulse: 69  °Temp: 98.8 °F (37.1 °C)  °TempSrc: Skin  °SpO2: 94%  °Weight: 218 lb 11.2 oz (99.2 kg)  ° °Body mass index is   is 31.38 kg/m.   Incision:  well healed Extremities:   There is a faintly palpable left radial pulse.   Motor and sensory are in tact.   There is a thrill present.  The fistula is easily palpable   Dialysis Duplex on 04/30/2021: Diameter:  0.57-0.82cm Depth:  0.12-0.47cm   Assessment/Plan:  This is a 70 y.o. male who is s/p: left BC AVF on 03/21/2021 by Dr. Stanford Breed.   -the pt does not have evidence of steal.  He does have some neuropathy in his hands due to diabetes and this has not changed since his fistula creation.   -the fistula can be used 06/21/2020. -discussed with pt and his wife that access does not last forever and will need intervention or even new access at some point.  -the pt will follow up as needed   Leontine Locket, [facility] [phone]  Clinic MD:  Stanford Breed

## 2021-05-02 ENCOUNTER — Telehealth: Payer: Self-pay | Admitting: Internal Medicine

## 2021-05-02 ENCOUNTER — Telehealth: Payer: Self-pay | Admitting: Cardiology

## 2021-05-02 NOTE — Telephone Encounter (Signed)
Left message to call back  

## 2021-05-02 NOTE — Telephone Encounter (Signed)
Pt's wife just wanted to let Dr. Stanford Breed know that they mistakenly missed an appointment made with one of his APPs Thea Alken, FNP). Wife states that there is a lot going on with the pt with declining renal status and possible renal transplant. Wife wanted note to be made that they do not typically miss appointments and wanted to apologize for missing that office visit. Offered to make pt an appointment, wife states that she wants to hold off for now. Wife states that they will call back in the future to set up appointment with Dr. Stanford Breed. Will send message to Dr. Stanford Breed and his nurse.

## 2021-05-02 NOTE — Telephone Encounter (Signed)
New Message:    Patient 's wife called. She wanted Dr Stanford Breed to know that patient have been no showing and canceling appointments, because he is having a lot of Renal problems. She did not want him to think that he was just not coming.

## 2021-05-02 NOTE — Telephone Encounter (Signed)
Noted.  Will close message. 

## 2021-05-24 ENCOUNTER — Encounter: Payer: Self-pay | Admitting: Internal Medicine

## 2021-05-24 ENCOUNTER — Other Ambulatory Visit: Payer: Self-pay

## 2021-05-24 ENCOUNTER — Ambulatory Visit: Payer: Medicare HMO | Admitting: Internal Medicine

## 2021-05-24 DIAGNOSIS — J90 Pleural effusion, not elsewhere classified: Secondary | ICD-10-CM

## 2021-05-24 DIAGNOSIS — J9611 Chronic respiratory failure with hypoxia: Secondary | ICD-10-CM

## 2021-05-24 NOTE — Progress Notes (Signed)
Melvin Mcclure, male    DOB: 1951-04-03    MRN: [id number]   Brief patient profile:  71 yobm quit smoking in the 1970s/ wife is RN played [employer] and worked as Engineer, structural with new onset sob fall 2019 dx chf /cri and placed on 02 p last admit   Admit date: 01/17/2020 Discharge date: 01/23/2020   Admitted From: Home Disposition: Home   Recommendations for Outpatient Follow-up:  Follow up with PCP in 1-2 weeks Follow-up with nephrology, Dr. Royce Macadamia as scheduled Follow-up with cardiology as scheduled Discharged on increased torsemide 40 mg p.o. twice daily with metolazone to use twice weekly as needed for increased shortness of breath, edema, or weight gain Discharged on supplemental oxygen 3 L per nasal cannula Please obtain BMP in one week Please follow up on the following pending results:   Home Health: No Equipment/Devices: Oxygen, 3 L per nasal cannula   Discharge Condition: Stable CODE STATUS: Full code Diet recommendation: Renal/low-salt diet   History of present illness:   Melvin Mcclure is a 71 year old male with past medical history notable for CKD stage IV, diastolic congestive heart failure, essential hypertension, type 2 diabetes mellitus with recurrent hospitalizations who was sent in from his cardiologist office for shortness of breath after being found to have recurrent right pleural effusion with associated hypoxia.  In the ED, SPO2 was noted to be in the 80s on room air, and in the mid 90s on 2 L nasal cannula.  Nephrology and cardiology were consulted.  TRH consulted for admission.   Hospital course:   Acute hypoxic respiratory failure, present on admission Recurrent right pleural effusion Chronic diastolic congestive heart failure, decompensated Patient presenting from cardiology office with shortness of breath and found to be hypoxic with SPO2 in the low 80s on room air.  Patient was noted to have a recurrent right pleural effusion and underwent thoracentesis  on 01/18/2020 with 1.75 L removed .  Volume overload is complicated by his diastolic heart dysfunction as well as his severe kidney disease.  Nephrology and cardiology were consulted and followed during hospital course.  Patient was started on aggressive IV diuresis with furosemide with good output and was net negative 8.4 L during the hospitalization.  Patient was then transitioned to torsemide at a higher dose of 40 mg p.o. twice daily; which he will continue outpatient in addition to metolazone 5 mg p.o. twice weekly as needed for increased shortness of breath, weight gain, edema.  Patient will need home supplemental oxygen at 3 L per nasal cannula with exertion.  Will need close outpatient follow-up with both cardiology and nephrology following discharge.  Ultimately, patient will likely need hemodialysis in the near future given his recurrent hospitalizations and his difficulty managing his overall volume with diuretics given his severe renal insufficiency.   Acute renal failure on CKD stage IV Nephrology following as above Bridge assistance, follows outpatient with Dr. Royce Macadamia.  Increased home torsemide to 40 mg p.o. twice daily with occasional twice weekly metolazone as above.  Given his recurrent hospitalizations as above with difficulty managing his volume overload with his renal insufficiency, likely will need HD in the near future.  Outpatient follow-up with Dr. Royce Macadamia.   Type 2 diabetes mellitus Hemoglobin A1c 6.6 on 12/29/2019, well controlled.  Diet controlled at home.   Essential hypertension Continue home amlodipine 10 mg p.o. daily, Carvedilol 12.5 mg p.o. twice daily, Hydralazine 50 mg p.o. every 8 hours.  Continue aspirin.  Not on statin at home.  Follow-up with cardiology.   Discharge Diagnoses:  Principal Problem:   Acute respiratory failure with hypoxia (HCC) Active Problems:   Acute diastolic CHF (congestive heart failure) (HCC)   DM2 (diabetes mellitus, type 2) (Melvin Mcclure)   CKD stage  4 due to type 2 diabetes mellitus (Melvin Mcclure)   Hypertensive cardiomyopathy, with heart failure (HCC)   Recurrent right pleural effusion   HTN (hypertension)      History of Present Illness  04/12/2020  Pulmonary/ 1st office eval/Melvin Mcclure  Chief Complaint  Patient presents with   Consult    Shortness of breath at rest at around bedtime  Dyspnea:  Room to room  Cough: none  Sleep: bed is flat wedge x about 30 degrees  SABA use:  02 back on 2-4 lpm p 12/4 started needing 02 and zeroxyln x 2 and better on day of ov rec Re-tap prn    07/20/2020  f/u ov/Melvin Mcclure re: f/u effusion / asbestos exp  Chief Complaint  Patient presents with   Follow-up    CXR done. Breathing is overall doing.    Dyspnea:  Not limited by breathing from desired activities   Cough: none  Sleeping: bed is flat / two pillows SABA use: none 02:  05/09/20 desat >  2lpm [employer] ono since 06/06/20  Covid status:  vax 3  Rec We will check your report on the  overnight 02 saturations while on 2lpm > ok   - cxr 07/20/2020 improved but esr 51   Quant TB neg/ collagen vasc screen neg     05/24/2021  f/u ov/Melvin Mcclure re: R effusion/ 02 dep maint on prn 02 /  has not started  HD yet Chief Complaint  Patient presents with   Acute Visit    Pt c/o non prod cough, weight gain- had cxr done 05/23/21 and was told he may have pna.   Dyspnea:  around mid Dec 2022 indolent onset progressive doe across a parking lot assoc with abd swelling but not ext swelling ("never does have per wife/ RN) Cough: not much cough at all / no fever or cp or unintended wt loss Sleeping: flat position/ Melvin pillow SABA use: none  02: not checking while walking, using per per wife  Covid status:   vax  x 5    No obvious day to day or daytime variability or assoc excess/ purulent sputum or mucus plugs or hemoptysis or cp or chest tightness, subjective wheeze or overt sinus or hb symptoms.   Sleeping  without nocturnal  or early am exacerbation  of respiratory  c/o's  or need for noct saba. Also denies any obvious fluctuation of symptoms with weather or environmental changes or other aggravating or alleviating factors except as outlined above   No unusual exposure hx or h/o childhood pna/ asthma or knowledge of premature birth.  Current Allergies, Complete Past Medical History, Past Surgical History, Family History, and Social History were reviewed in [employer] record.  ROS  The following are not active complaints unless bolded Hoarseness, sore throat, dysphagia, dental problems, itching, sneezing,  nasal congestion or discharge of excess mucus or purulent secretions, ear ache,   fever, chills, sweats, unintended wt loss or wt gain, classically pleuritic or exertional cp,  orthopnea pnd or arm/hand swelling  or leg swelling, presyncope, palpitations, abdominal pain, anorexia, nausea, vomiting, diarrhea  or change in bowel habits or change in bladder habits, change in stools or change in urine, dysuria, hematuria,  rash, arthralgias, visual complaints, headache,  numbness, weakness or ataxia or problems with walking or coordination,  change in mood or  memory. Increased abd girth per wife/ pt denies symptoms        Current Meds  Medication Sig   amLODipine (NORVASC) 10 MG tablet Take 10 mg by mouth daily.   aspirin 81 MG tablet Take 81 mg by mouth daily.   blood glucose meter kit and supplies KIT Dispense based on patient and insurance preference. Use up to four times daily as directed. (FOR ICD-9 250.00, 250.01).   calcitRIOL (ROCALTROL) 0.25 MCG capsule Take 0.25 mcg by mouth every other day.   calcium acetate (PHOSLO) 667 MG capsule Take 1 capsule (667 mg total) by mouth 3 (three) times daily with meals.   calcium carbonate (TUMS - DOSED IN MG ELEMENTAL CALCIUM) 500 MG chewable tablet Chew 2 tablets by mouth at bedtime.   carvedilol (COREG) 12.5 MG tablet TAKE 1 TABLET BY MOUTH TWICE DAILY WITH A MEAL   diclofenac Sodium (VOLTAREN) 1 %  GEL Apply 2 g topically 4 (four) times daily.   gabapentin (NEURONTIN) 300 MG capsule Take 1 capsule (300 mg total) by mouth 2 (two) times daily.   hydrALAZINE (APRESOLINE) 50 MG tablet Take 1 tablet (50 mg total) by mouth every 8 (eight) hours. (Patient taking differently: Take 50 mg by mouth 3 (three) times daily. Pt takes 1 tablet daily)   HYDROcodone-acetaminophen (NORCO/VICODIN) 5-325 MG tablet Take 1 tablet by mouth every 4 (four) hours as needed for moderate pain.   hydrOXYzine (ATARAX/VISTARIL) 25 MG tablet Take 25 mg by mouth 2 (two) times daily as needed for anxiety.   Insulin Glargine (TOUJEO MAX SOLOSTAR [place]) Inject 20 Units into the skin every morning.   isosorbide mononitrate (IMDUR) 60 MG 24 hr tablet Take 1 tablet by mouth in the evening   meclizine (ANTIVERT) 25 MG tablet Take 1 tablet (25 mg total) by mouth 3 (three) times daily.   metolazone (ZAROXOLYN) 5 MG tablet 5 mg once weekly and as needed on $RemoveB'Sunday   senna-docusate (SENOKOT-S) 8.6-50 MG tablet Take 1 tablet by mouth 2 (two) times daily as needed for mild constipation.    torsemide (DEMADEX) 20 MG tablet Take 40 mg by mouth 2 (two) times daily.         Past Medical History:  Diagnosis Date   Anemia in chronic kidney disease (CKD)    CHF (congestive heart failure) (HCC) 05/28/2018   dx 05/28/18   Diabetes mellitus without complication (HCC)    Hyperlipidemia    Hypertension    Iron deficiency anemia    Perforating neurotrophic ulcer of foot (HCC)            Objective:    Wts  05/24/2021       21'[id number]$ 4    06/06/20 201 lb (91.2 kg)  04/30/20 199 lb 12.8 oz (90.6 kg)  04/21/20 198 lb 12.8 oz (90.2 kg)       Vital signs reviewed  05/24/2021  - Note at rest 02 sats  95% on RA   General appearance:    somber amb bm  nad   HEENT : pt wearing mask not removed for exam due to covid -19 concerns.    NECK :  without JVD/Nodes/TM/ nl carotid upstrokes bilaterally   LUNGS: no acc muscle use,  Nl contour chest with  decreased bs/dullness R base    CV:  RRR  no s3 or murmur or increase in P2, and no edema   ABD:  soft and nontender with nl inspiratory excursion in the supine position. No bruits or organomegaly appreciated, bowel sounds nl  MS:  Nl gait/ ext warm without deformities, calf tenderness, cyanosis or clubbing No obvious joint restrictions   SKIN: warm and dry without lesions    NEURO:  alert, approp, nl sensorium with  no motor or cerebellar deficits apparent.     I personally reviewed images and agree with radiology impression as follows:  CXR:   pa and lateral 05/23/21 Mild cardiomegaly, pulmonary vascular congestion, and small right pleural effusion suggest volume overload.  CT w/o contrast 05/23/21  1.  Findings concerning for right lower lobar pneumonia with adjacent pleural effusion.  2.  Enlarged retroperitoneal lymph node as above with scattered conspicuous small abdominal lymph nodes which are nonspecific and possibly reactive. Lymphoproliferative disorder not excluded.  3.  Minimal calcifications involving the distal aspects of the bilateral external iliac arteries.  4.  Incompletely characterized left hepatic lobe hypoattenuating lesion which could reflect a benign hepatic cystic lesion. Contrast-enhanced cross-sectional hepatic protocol imaging can further characterize as clinically indicated.  5.  Nonspecific ill-defined sclerotic lesion involving the L3 vertebral body. Correlation with any prior exams and past medical history is recommended.  My review: Right lower lobe airspace consolidation with air bronchograms and partially visualized right pleural effusion               Assessment

## 2021-05-24 NOTE — Patient Instructions (Signed)
Make sure you check your oxygen saturation  AT  your highest level of activity (not after you stop)   to be sure it stays over 90% and adjust  02 flow upward to maintain this level if needed but remember to turn it back to previous settings when you stop (to conserve your supply).    We will call you to schedule a thoracentesis  - hold the diuretic that day and the zaroxolyn for at least 3 days    Follow up here is as needed if your fluid problem has been resolved.

## 2021-05-24 NOTE — Assessment & Plan Note (Addendum)
R  thoracentesis on 12/26/2019  X 700 cc  Exudative with wbc 1429 and 78% L/ neg cytology  -  R  thoracentesis on 01/18/2020 with 1.75 L removed  -  R T centesis  04/16/20 x  2 liters >>>  Exudate  With nl glucose  wbc 867   80% Lymphs >  Cyt neg/ flow cytometry >>> not done  - CT  04/18/20 1. Right lower lobe infiltrate/consolidation/collapse. Some dependent infiltrate/volume loss in the right upper lobe. Moderate hazy pneumonia in the right middle lobe. Moderate size right effusion layering dependently without evidence of loculation. Findings may be due 2 pneumonia, but the possibility of tumor in the right inferior hilar region is not excluded by this exam. 2. Dependent atelectasis or infiltrate in the left lower lobe. - CT 04/29/21 slt worse effusion - cxr 07/20/2020 improved but esr 51   Quant TB neg/ collagen vasc screen neg     Symptoms worse since mid dec 2022 assoc with increasing abd girth/ wt gain - 05/24/2021 rec re tap and include flow cytometry with cytology   Most likely the lung changes of "consolidation and air bronchograms" are due to relaxation Atx from fluid, not due to pnuemonia or neoplasm, but repeating the u/s now makes sense as otherwise would need more aggressive diuresis risking worsening renal failure or electrolyte imbalance  Discussed in detail all the  indications, usual  risks and alternatives  relative to the benefits with patient/wifew who agree  to proceed with w/u as outlined.

## 2021-05-24 NOTE — Assessment & Plan Note (Signed)
Desat 05/09/2020- overnight oximetry on room air-duration of sleep 6 hours and 7 minutes-time spent below 88% 315 minutes, average oxygenation 85% rec  start 2 L of O2 at night based off this test > done 06/05/20  -  06/06/2020   Walked RA  2 laps @ approx 255ft each @modpace   stopped due to end of study, min  sob  And sats 93% at end  - ONO on 2lpm  06/05/20  desats x 3 min > no change rx   Advised:  Make sure you check your oxygen saturation  AT  your highest level of activity (not after you stop)   to be sure it stays over 90% and adjust  02 flow upward to maintain this level if needed but remember to turn it back to previous settings when you stop (to conserve your supply).    Each maintenance medication was reviewed in detail including emphasizing most importantly the difference between maintenance and prns and under what circumstances the prns are to be triggered using an action plan format where appropriate.  Total time for H and P, chart review, counseling, reviewing 02/ pulse ox device(s) , directly observing portions of ambulatory 02 saturation study/ and generating customized AVS unique to this office visit / same day charting = 40 min

## 2021-05-28 ENCOUNTER — Other Ambulatory Visit: Payer: Self-pay

## 2021-05-28 ENCOUNTER — Ambulatory Visit (HOSPITAL_COMMUNITY)
Admission: RE | Admit: 2021-05-28 | Discharge: 2021-05-28 | Disposition: A | Discharge status: Home / Self Care | Payer: Medicare HMO | Source: Ambulatory Visit | Attending: Internal Medicine | Admitting: Internal Medicine

## 2021-05-28 ENCOUNTER — Ambulatory Visit (HOSPITAL_COMMUNITY)
Admission: RE | Admit: 2021-05-28 | Discharge: 2021-05-28 | Disposition: A | Discharge status: Home / Self Care | Payer: Medicare HMO | Source: Ambulatory Visit | Attending: Radiology | Admitting: Radiology

## 2021-05-28 DIAGNOSIS — Z9889 Other specified postprocedural states: Secondary | ICD-10-CM

## 2021-05-28 DIAGNOSIS — J90 Pleural effusion, not elsewhere classified: Secondary | ICD-10-CM | POA: Insufficient documentation

## 2021-05-28 DIAGNOSIS — I509 Heart failure, unspecified: Secondary | ICD-10-CM | POA: Diagnosis not present

## 2021-05-28 DIAGNOSIS — N185 Chronic kidney disease, stage 5: Secondary | ICD-10-CM | POA: Diagnosis not present

## 2021-05-28 DIAGNOSIS — D72820 Lymphocytosis (symptomatic): Secondary | ICD-10-CM | POA: Insufficient documentation

## 2021-05-28 DIAGNOSIS — R0600 Dyspnea, unspecified: Secondary | ICD-10-CM | POA: Insufficient documentation

## 2021-05-28 LAB — BODY FLUID CELL COUNT WITH DIFFERENTIAL
Lymphs, Fluid: 79 %
Monocyte-Macrophage-Serous Fluid: 19 % — ABNORMAL LOW (ref 50–90)
Neutrophil Count, Fluid: 2 % (ref 0–25)
Total Nucleated Cell Count, Fluid: 3098 cu mm — ABNORMAL HIGH (ref 0–1000)

## 2021-05-28 LAB — PROTEIN, PLEURAL OR PERITONEAL FLUID: Total protein, fluid: 4.1 g/dL

## 2021-05-28 LAB — LACTATE DEHYDROGENASE, PLEURAL OR PERITONEAL FLUID: LD, Fluid: 104 U/L — ABNORMAL HIGH (ref 3–23)

## 2021-05-28 LAB — GLUCOSE, PLEURAL OR PERITONEAL FLUID: Glucose, Fluid: 179 mg/dL

## 2021-05-28 LAB — ALBUMIN, PLEURAL OR PERITONEAL FLUID: Albumin, Fluid: 2.5 g/dL

## 2021-05-28 MED ORDER — LIDOCAINE HCL 1 % IJ SOLN
INTRAMUSCULAR | Status: AC
  Administered 2021-05-28: 15:00:00 15 mL
  Filled 2021-05-28: qty 20

## 2021-05-28 NOTE — Procedures (Signed)
Ultrasound-guided diagnostic and therapeutic right thoracentesis performed yielding 470 cc of slightly hazy, amber colored fluid. No immediate complications. Follow-up chest x-ray pending. The fluid was sent to the lab for preordered studies. EBL none.

## 2021-05-30 LAB — CYTOLOGY - NON PAP

## 2021-06-13 NOTE — Progress Notes (Signed)
Patient Active Problem List   Diagnosis Date Noted   Abnormal findings on diagnostic imaging of lung 04/30/2020   Chronic respiratory failure with hypoxia ([place]) 04/17/2020   Pleural effusion on right 01/17/2020   HTN (hypertension) 01/17/2020   Acute exacerbation of CHF (congestive heart failure) ([place]) 12/23/2019   Hypocalcemia 12/23/2019   Anemia of chronic disease 12/23/2019   Vertigo 12/23/2019   Acute respiratory failure with hypoxia ([place]) 12/23/2019   CAP (community acquired pneumonia) 10/28/2019   Hypertensive cardiomyopathy, with heart failure ([place]) 06/15/2018   Acute on chronic diastolic CHF (congestive heart failure) ([place]) 05/28/2018   DM2 (diabetes mellitus, type 2) ([place]) 05/28/2018   CKD stage 4 due to type 2 diabetes mellitus ([place]) 05/28/2018   CHF (congestive heart failure) ([place]) 05/28/2018    Patient's Medications  New Prescriptions   No medications on file  Previous Medications   AMLODIPINE (NORVASC) 10 MG TABLET    Take 10 mg by mouth daily.   ASPIRIN 81 MG TABLET    Take 81 mg by mouth daily.   BLOOD GLUCOSE METER KIT AND SUPPLIES KIT    Dispense based on patient and insurance preference. Use up to four times daily as directed. (FOR ICD-9 250.00, 250.01).   CALCITRIOL (ROCALTROL) 0.25 MCG CAPSULE    Take 0.25 mcg by mouth every other day.   CALCIUM ACETATE (PHOSLO) 667 MG CAPSULE    Take 1 capsule (667 mg total) by mouth 3 (three) times daily with meals.   CALCIUM CARBONATE (TUMS - DOSED IN MG ELEMENTAL CALCIUM) 500 MG CHEWABLE TABLET    Chew 2 tablets by mouth at bedtime.   CARVEDILOL (COREG) 12.5 MG TABLET    TAKE 1 TABLET BY MOUTH TWICE DAILY WITH A MEAL   DICLOFENAC SODIUM (VOLTAREN) 1 % GEL    Apply 2 g topically 4 (four) times daily.   GABAPENTIN (NEURONTIN) 300 MG CAPSULE    Take 1 capsule (300 mg total) by mouth 2 (two) times daily.   HYDRALAZINE (APRESOLINE) 50 MG TABLET    Take 1 tablet (50 mg total) by mouth every 8 (eight) hours.    HYDROCODONE-ACETAMINOPHEN (NORCO/VICODIN) 5-325 MG TABLET    Take 1 tablet by mouth every 4 (four) hours as needed for moderate pain.   HYDROXYZINE (ATARAX/VISTARIL) 25 MG TABLET    Take 25 mg by mouth 2 (two) times daily as needed for anxiety.   INSULIN GLARGINE (TOUJEO MAX SOLOSTAR [facility])    Inject 20 Units into the skin every morning.   ISOSORBIDE MONONITRATE (IMDUR) 60 MG 24 HR TABLET    Take 1 tablet by mouth in the evening   MECLIZINE (ANTIVERT) 25 MG TABLET    Take 1 tablet (25 mg total) by mouth 3 (three) times daily.   METOLAZONE (ZAROXOLYN) 5 MG TABLET    5 mg once weekly and as needed on Sunday   SENNA-DOCUSATE (SENOKOT-S) 8.6-50 MG TABLET    Take 1 tablet by mouth 2 (two) times daily as needed for mild constipation.    TORSEMIDE (DEMADEX) 20 MG TABLET    Take 40 mg by mouth 2 (two) times daily.  Modified Medications   No medications on file  Discontinued Medications   No medications on file    Subjective: 71 Y O  male with PMH of CKD/AOCD, CHF, Anxiety,Arthritis,  DM, HLD, HTN, IDA, NMD/neuropathy, recurrent rt sided pleural effusion s/p multiple thoracentesis who is currently in the process of pretransplant evaluation of kidney  referred from Nephrology given positive strongyloides ab in blood. Patient is accompanied by wife who is helping with the history. Patient is somewhat hard of hearing. He is very surprised with the positive serology and had multiple questions regarding how he might have got it.   Denies fevers, chills and sweats Denies nausea, vomiting, abdominal pain, diarrhea, passing worms in stool Difficulty with breathing + Denies cough or chest pain Denies headache, new or worsening visual or hearing changes Denies rashes, joint pain due to arthritis   Born in [place]. Has traveled to [place], [place], [place] in the past. Recently went to cruise in May 2022  Used to work as a Copy and also in asbestos plant  Denies any close contact with farm animals.   EX smoker, smoked for 1.5 years. Denies alcohol and recreational drugs  He has a dog which stays at the yard and says he has very minimal encounter with the dog.   Review of Systems: ROS all systems reviewed and negative except as above  Past Medical History:  Diagnosis Date   Anemia in chronic kidney disease (CKD)    stage 5   Anxiety    Arthritis    CHF (congestive heart failure) ([place]) 05/28/2018   dx 05/28/18   Diabetes mellitus without complication (HCC)    Hyperlipidemia    Hypertension    Iron deficiency anemia    Neuromuscular disorder (HCC)    neuropathy feet/hands   Perforating neurotrophic ulcer of foot ([place])    Past Surgical History:  Procedure Laterality Date   ACHILLES TENDON REPAIR Bilateral    AV FISTULA PLACEMENT Left 03/21/2021   Procedure: LEFT BRACHIOCEPHALIC ARTERIOVENOUS (AV) FISTULA CREATION;  Surgeon: Cherre Robins, MD;  Location: [facility] OR;  Service: Vascular;  Laterality: Left;   COLONOSCOPY     CYST EXCISION     lipoma removed from arm   IR THORACENTESIS ASP PLEURAL SPACE W/IMG GUIDE  12/26/2019   IR THORACENTESIS ASP PLEURAL SPACE W/IMG GUIDE  01/18/2020   THORACENTESIS  04/16/2020     Social History   Tobacco Use   Smoking status: Former    Packs/day: 1.00    Years: 1.50    Pack years: 1.50    Types: Cigarettes   Smokeless tobacco: Never   Tobacco comments:    smoked for only 1.5 years in the 70's  Vaping Use   Vaping Use: Never used  Substance Use Topics   Alcohol use: No    Alcohol/week: 0.0 standard drinks   Drug use: No    Family History  Problem Relation Age of Onset   Diabetes Mellitus II Mother    Hypertension Father     No Known Allergies  Health Maintenance  Topic Date Due   FOOT EXAM  Never done   OPHTHALMOLOGY EXAM  Never done   Hepatitis C Screening  Never done   TETANUS/TDAP  Never done   COLONOSCOPY (Pts 45-73yr Insurance coverage will need to be confirmed)  Never done   Zoster Vaccines- Shingrix (1 of  2) Never done   Pneumonia Vaccine 71 Years old (2 - PCV) 06/03/2019   HEMOGLOBIN A1C  10/17/2020   INFLUENZA VACCINE  Completed   COVID-19 Vaccine  Completed   HPV VACCINES  Aged Out    Objective: BP 127/69    Pulse 71    Temp 97.8 F (36.6 C) (Oral)    Ht _0  (1.778 m)    Wt 216 lb (98 kg)  SpO2 91%    BMI 30.99 kg/m   Physical Exam Constitutional:      Appearance: Normal appearance. Elderly male sitting in the chair  HENT:  hard of hearing     Head: Normocephalic and atraumatic.      Mouth: Mucous membranes are moist.  Eyes:    Conjunctiva/sclera: Conjunctivae normal.     Pupils: Pupils are equal, round  Cardiovascular:     Rate and Rhythm: Normal rate and regular rhythm.     Heart sounds:   Pulmonary:     Effort: Pulmonary effort is normal.     Breath sounds: Normal breath sounds.   Abdominal:     General: Non distended     Palpations: soft.   Musculoskeletal:        General: Normal range of motion.   Skin:    General: Skin is warm and dry.     Comments:  Neurological:     General: grossly non focal     Mental Status: awake, alert and oriented to person, place, and time.   Psychiatric:        Mood and Affect: Mood normal.   Lab Results Lab Results  Component Value Date   WBC 8.6 01/22/2021   HGB 11.9 (L) 03/21/2021   HCT 35.0 (L) 03/21/2021   MCV 88.0 01/22/2021   PLT 225 01/22/2021    Lab Results  Component Value Date   CREATININE 5.02 (H) 03/26/2021   BUN 88 (H) 03/26/2021   NA 141 03/26/2021   K 3.8 03/26/2021   CL 97 (L) 03/26/2021   CO2 31 03/26/2021    Lab Results  Component Value Date   ALT 11 11/29/2020   AST 16 11/29/2020   ALKPHOS 84 04/17/2020   BILITOT 0.6 11/29/2020    Lab Results  Component Value Date   CHOL 178 11/29/2020   HDL 35 (L) 11/29/2020   LDLCALC 118 (H) 11/29/2020   TRIG 130 11/29/2020   CHOLHDL 5.1 (H) 11/29/2020   No results found for: LABRPR, RPRTITER No results found for: HIV1RNAQUANT, HIV1RNAVL,  CD4TABS  Problem List Items Addressed This Visit       Respiratory   Recurrent pleural effusion on right     Endocrine   CKD stage 4 due to type 2 diabetes mellitus (HCC) (Chronic)     Other   Strongyloidiasis - Primary   Relevant Medications   ivermectin (STROMECTOL) 3 MG TABS tablet   ivermectin (STROMECTOL) 3 MG TABS tablet   Other Relevant Orders   Ova and parasite examination   Ova and parasite examination   Ova and parasite examination   Ova and parasite examination   CBC with Differential/Platelet (Completed)   Comprehensive metabolic panel (Completed)   HIV Antibody (routine testing w rflx) (Completed)   Positive strongyloidosis ab  Recurrent Rt sided pleural effusion - likely transudative ? Reactive in the setting of strongyloidiasis. R/o malignancy. Pleural effusion with no malignant cells. Fu with pulmonary  CKD: in the process of kidney transplant eval  DM2   CBC with no eosinophilia. Pleural effusion with no eosinophils   Ivermectin 224mg/kg daily for 2 doses, repeat in 2 weeks  given patient is a potential transplant recipient Sputum/stool ova and parasite Fu in 2 weeks   I have personally 72 minutes involved in face-to-face and non-face-to-face activities for this patient on the day of the visit. Professional time spent includes the following activities: Preparing to see the patient (review of tests), Obtaining and/or reviewing separately  obtained history (admission/discharge record), Performing a medically appropriate examination and/or evaluation , Ordering medications/tests/procedures, referring and communicating with other health care professionals, Documenting clinical information in the EMR, Independently interpreting results (not separately reported), Communicating results to the patient/family/caregiver, Counseling and educating the patient/family/caregiver and Care coordination (not separately reported).   Wilber Oliphant, [place] [facility] 06/13/2021, 8:55 AM

## 2021-06-14 ENCOUNTER — Other Ambulatory Visit: Payer: Self-pay

## 2021-06-14 ENCOUNTER — Other Ambulatory Visit: Payer: Self-pay | Admitting: Cardiology

## 2021-06-14 ENCOUNTER — Ambulatory Visit: Payer: Medicare HMO | Admitting: Infectious Diseases

## 2021-06-14 ENCOUNTER — Encounter: Payer: Self-pay | Admitting: Infectious Diseases

## 2021-06-14 VITALS — BP 127/69 | HR 71 | Temp 97.8°F | Ht 70.0 in | Wt 216.0 lb

## 2021-06-14 DIAGNOSIS — N184 Chronic kidney disease, stage 4 (severe): Secondary | ICD-10-CM | POA: Diagnosis not present

## 2021-06-14 DIAGNOSIS — E1122 Type 2 diabetes mellitus with diabetic chronic kidney disease: Secondary | ICD-10-CM | POA: Diagnosis not present

## 2021-06-14 DIAGNOSIS — B789 Strongyloidiasis, unspecified: Secondary | ICD-10-CM | POA: Diagnosis not present

## 2021-06-14 DIAGNOSIS — J90 Pleural effusion, not elsewhere classified: Secondary | ICD-10-CM

## 2021-06-14 LAB — CBC WITH DIFFERENTIAL/PLATELET
Absolute Monocytes: 547 cells/uL (ref 200–950)
Basophils Absolute: 79 cells/uL (ref 0–200)
Basophils Relative: 1.1 %
Eosinophils Absolute: 302 cells/uL (ref 15–500)
Eosinophils Relative: 4.2 %
HCT: 37.6 % — ABNORMAL LOW (ref 38.5–50.0)
Hemoglobin: 12.2 g/dL — ABNORMAL LOW (ref 13.2–17.1)
Lymphs Abs: 1778 cells/uL (ref 850–3900)
MCH: 27.7 pg (ref 27.0–33.0)
MCHC: 32.4 g/dL (ref 32.0–36.0)
MCV: 85.5 fL (ref 80.0–100.0)
MPV: 10.2 fL (ref 7.5–12.5)
Monocytes Relative: 7.6 %
Neutro Abs: 4493 cells/uL (ref 1500–7800)
Neutrophils Relative %: 62.4 %
Platelets: 232 10*3/uL (ref 140–400)
RBC: 4.4 10*6/uL (ref 4.20–5.80)
RDW: 15 % (ref 11.0–15.0)
Total Lymphocyte: 24.7 %
WBC: 7.2 10*3/uL (ref 3.8–10.8)

## 2021-06-14 LAB — COMPREHENSIVE METABOLIC PANEL
AG Ratio: 0.9 (calc) — ABNORMAL LOW (ref 1.0–2.5)
ALT: 8 U/L — ABNORMAL LOW (ref 9–46)
AST: 13 U/L (ref 10–35)
Albumin: 3.7 g/dL (ref 3.6–5.1)
Alkaline phosphatase (APISO): 63 U/L (ref 35–144)
BUN/Creatinine Ratio: 14 (calc) (ref 6–22)
BUN: 60 mg/dL — ABNORMAL HIGH (ref 7–25)
CO2: 34 mmol/L — ABNORMAL HIGH (ref 20–32)
Calcium: 9.5 mg/dL (ref 8.6–10.3)
Chloride: 99 mmol/L (ref 98–110)
Creat: 4.42 mg/dL — ABNORMAL HIGH (ref 0.70–1.28)
Globulin: 4.1 g/dL (calc) — ABNORMAL HIGH (ref 1.9–3.7)
Glucose, Bld: 157 mg/dL — ABNORMAL HIGH (ref 65–99)
Potassium: 4.1 mmol/L (ref 3.5–5.3)
Sodium: 143 mmol/L (ref 135–146)
Total Bilirubin: 0.4 mg/dL (ref 0.2–1.2)
Total Protein: 7.8 g/dL (ref 6.1–8.1)

## 2021-06-14 LAB — HIV ANTIBODY (ROUTINE TESTING W REFLEX): HIV 1&2 Ab, 4th Generation: NONREACTIVE

## 2021-06-14 MED ORDER — IVERMECTIN 3 MG PO TABS: 20.0000 mg | ORAL_TABLET | Freq: Every day | ORAL | 0 refills | Status: AC

## 2021-06-16 DIAGNOSIS — J90 Pleural effusion, not elsewhere classified: Secondary | ICD-10-CM | POA: Insufficient documentation

## 2021-06-16 DIAGNOSIS — B789 Strongyloidiasis, unspecified: Secondary | ICD-10-CM | POA: Insufficient documentation

## 2021-06-20 ENCOUNTER — Other Ambulatory Visit: Payer: Self-pay

## 2021-06-20 ENCOUNTER — Other Ambulatory Visit: Payer: Medicare HMO

## 2021-06-20 DIAGNOSIS — B789 Strongyloidiasis, unspecified: Secondary | ICD-10-CM

## 2021-06-21 LAB — OVA AND PARASITE EXAMINATION

## 2021-06-24 ENCOUNTER — Telehealth: Payer: Self-pay

## 2021-06-24 LAB — OVA AND PARASITE EXAMINATION
CONCENTRATE RESULT:: NONE SEEN
CONCENTRATE RESULT:: NONE SEEN
CONCENTRATE RESULT:: NONE SEEN
MICRO NUMBER:: 13018909
MICRO NUMBER:: 13018913
MICRO NUMBER:: 13018995
SPECIMEN QUALITY:: ADEQUATE
SPECIMEN QUALITY:: ADEQUATE
SPECIMEN QUALITY:: ADEQUATE
TRICHROME RESULT:: NONE SEEN
TRICHROME RESULT:: NONE SEEN
TRICHROME RESULT:: NONE SEEN

## 2021-06-24 NOTE — Telephone Encounter (Signed)
Called patient to notify him of need for recollection, no answer. Left HIPAA compliant voicemail requesting callback.   Beryle Flock, RN

## 2021-06-24 NOTE — Telephone Encounter (Signed)
-----   Message from Rosiland Oz, MD sent at 06/24/2021 11:26 AM EST ----- If he can then, yes.  ----- Message ----- From: Beryle Flock, RN Sent: 06/24/2021   8:22 AM EST To: Rosiland Oz, MD  Ova and parasite samples could not be processed, the lab staff said that he seemed to have dumped out a lot of the solution in the collection container. Does he need to recollect? Thanks

## 2021-06-25 NOTE — Telephone Encounter (Signed)
Patient's wife returned call and will come by the office tomorrow to pick up stool sample kit for recollection. Melvin Mcclure

## 2021-07-03 NOTE — Progress Notes (Signed)
Cardiology Office Note:    Date:  07/10/2021   ID:  Melvin Mcclure, DOB August 04, 1950, MRN [id number]  PCP:  Fleet Contras, MD   [facility] Providers Cardiologist:  Olga Millers, MD      Referring MD: Fleet Contras, MD   follow-up evaluation of his acute on chronic diastolic CHF  History of Present Illness:    Melvin Mcclure is a 71 y.o. male with a hx of chronic stage IV kidney disease, chronic diastolic CHF, essential hypertension.  Echocardiogram 6/21 showed normal LV function, moderate left ventricular hypertrophy, mild mitral regurgitation, trivial to small pericardial effusion.  Underwent PYP scan 8/21 which was not suggestive of amyloidosis.  He was admitted to the hospital 01/17/2020 until 01/23/2020 with acute on chronic diastolic CHF, acute hypoxic respiratory failure, and recurrent right pleural effusion.  He received IV diuresis and 8.4 l were diuresed.  He was transitioned to torsemide and metolazone to be taken twice weekly as needed.  He was receiving supplemental home O2 to 3 L via nasal cannula.  He was instructed to follow-up with nephrology and cardiology.  It was felt that he would ultimately need HD in the near future.   He was seen virtually 03/01/2020 in follow-up and stated he was feeling somewhat better .  He had been having spells of vertigo which he managed with meclizine.  He indicated that yesterday he was very dizzy and unable to do much activity.  He stated that overall he was doing much better on torsemide and he did not need to take any metolazone.  His weight had been stable and his blood pressure had remained in the 130s over 70s.  He hoped that he would be able to get back to playing tennis.  He had begun to wean his oxygen.   He was using 2 L of oxygen.  He stated at times he did not wear it but had been wearing it at night and was sleeping consistently.  He met with nephrology 02/29/2020 who indicated they did not feel he would need HD at the time.  I had  him continue to increase his physical activity as tolerated, follow a low-sodium heart healthy diet, and planned follow-up in 3 months.   He was admitted to the hospital 04/17/2020 and discharged on 04/21/2020.  He presented to the emergency department with worsening shortness of breath and productive cough.  He underwent a right thoracentesis 04/17/2019 and 2 L of fluid was removed.  Despite this his shortness of breath continued to worsen.  On arrival to the emergency department he was noted to be tachypneic and hypoxic.  His chest x-ray showed notable vascular congestion and mild pulmonary edema, hazy opacity throughout his right thorax suspicious of pleural effusion, suspicious of pneumonia versus atelectasis.  A chest CT without contrast showed right lower lobe infiltrate/consolidation/collapse.  Findings were felt to be consistent with pneumonia but the possibility of tumor in the right inferior hilar region could not be excluded.  His cytology showed no malignant cells.  He was treated with IV Rocephin and Zithromax for possible community-acquired pneumonia.  He was transitioned to oral antibiotics at discharge and his oxygen was weaned down to 1 L.  He received IV Lasix since was transitioned to oral torsemide.  It was recommended that he follow-up with Dr. Sharee Pimple in 2-4 weeks to repeat imaging.  His creatinine was stable around 4.1, blood pressure was well managed, and his hemoglobin was 10.8.   He was seen by Dr.  Mack on 04/30/2020.  He reported having good and bad days since being discharged from the hospital.  He had occasional dyspnea.  He reported that his weight was 199 and he required 2 L of oxygen.  His appetite was getting back to normal and he reported compliance with his 1500 mL fluid restriction.  He reported he had been weighing himself daily and did not have lower extremity edema.  His diuretics were continued and CT was planned for 4 to 6 weeks with follow-up in 4 weeks.   He presented to  the clinic  06/07/20 for follow-up evaluation stated he felt well.  He presented on room air.  Oxygen saturation 93%.  He had follow-up chest CT 05/30/2020 which showed persistent pleural effusion.  He was seen by pulmonology 06/06/20 who reported they were going to monitor at the time.  He continued to be physically active at home doing housework/yard work, doing stairs.  He was following a low-sodium diet.  He had done some overnight oxygenation trials which showed that his oxygen saturation was dropping into the high 80s.  He repeated the trial with 2 L of oxygen and was waiting on results.  I gave him the salty 6 diet sheet, had him continue his physical activity, continue his current medication regimen, and planned follow-up in 3 months.  He was seen by Dr. Jens Som on 09/04/2020.  During that time he denied dyspnea on exertion, orthopnea, PND, lower extremity swelling, exertional chest pain and syncope.  He presents to the clinic today for follow-up evaluation and states he feels well.  He is in the process of doing work-up for renal transplant.  His wife presents with him today and reports that this fistula was placed and waiting for it to be ensured.  His renal function is stable at this time.  He denies any increased work of breathing.  He does note that his endurance is not what it was.  We reviewed his recent thoracentesis from a the 19th with 500 mL out.  As part of the pretransplant work-up we will repeat his echocardiogram.  His last office will reports that during previous lab work he had elevated troponin levels.  We reviewed his lab work from [facility].  At the time of his elevated troponins she reports that he was fluid volume overloaded.  He is euvolemic today.  I will continue his current diet, current medication regimen, order echocardiogram and plan follow-up for 6 months.   Today he denies chest pain, increased shortness of breath, lower extremity edema, fatigue, palpitations,  melena, hematuria, hemoptysis, diaphoresis, weakness, presyncope, syncope, orthopnea, and PND.  Past Medical History:  Diagnosis Date   Anemia in chronic kidney disease (CKD)    stage 5   Anxiety    Arthritis    CHF (congestive heart failure) (HCC) 05/28/2018   dx 05/28/18   Diabetes mellitus without complication (HCC)    Hyperlipidemia    Hypertension    Iron deficiency anemia    Neuromuscular disorder (HCC)    neuropathy feet/hands   Perforating neurotrophic ulcer of foot (HCC)     Past Surgical History:  Procedure Laterality Date   ACHILLES TENDON REPAIR Bilateral    AV FISTULA PLACEMENT Left 03/21/2021   Procedure: LEFT BRACHIOCEPHALIC ARTERIOVENOUS (AV) FISTULA CREATION;  Surgeon: Leonie Douglas, MD;  Location: [facility] OR;  Service: Vascular;  Laterality: Left;   COLONOSCOPY     CYST EXCISION     lipoma removed from arm  IR THORACENTESIS ASP PLEURAL SPACE W/IMG GUIDE  12/26/2019   IR THORACENTESIS ASP PLEURAL SPACE W/IMG GUIDE  01/18/2020   THORACENTESIS  04/16/2020    Current Medications: Current Meds  Medication Sig   amLODipine (NORVASC) 10 MG tablet Take 10 mg by mouth daily.   aspirin 81 MG tablet Take 81 mg by mouth daily.   blood glucose meter kit and supplies KIT Dispense based on patient and insurance preference. Use up to four times daily as directed. (FOR ICD-9 250.00, 250.01).   calcitRIOL (ROCALTROL) 0.25 MCG capsule Take 0.25 mcg by mouth every other day.   calcium acetate (PHOSLO) 667 MG capsule Take 1 capsule (667 mg total) by mouth 3 (three) times daily with meals.   calcium carbonate (TUMS - DOSED IN MG ELEMENTAL CALCIUM) 500 MG chewable tablet Chew 2 tablets by mouth at bedtime.   carvedilol (COREG) 12.5 MG tablet TAKE 1 TABLET BY MOUTH TWICE DAILY WITH A MEAL   diclofenac Sodium (VOLTAREN) 1 % GEL Apply 2 g topically 4 (four) times daily.   gabapentin (NEURONTIN) 300 MG capsule Take 1 capsule (300 mg total) by mouth 2 (two) times daily.   hydrALAZINE  (APRESOLINE) 50 MG tablet Take 1 tablet (50 mg total) by mouth every 8 (eight) hours. (Patient taking differently: Take 50 mg by mouth 3 (three) times daily. Pt takes 1 tablet daily)   HYDROcodone-acetaminophen (NORCO/VICODIN) 5-325 MG tablet Take 1 tablet by mouth every 4 (four) hours as needed for moderate pain.   hydrOXYzine (ATARAX/VISTARIL) 25 MG tablet Take 25 mg by mouth 2 (two) times daily as needed for anxiety.   Insulin Glargine (TOUJEO MAX SOLOSTAR Lillian) Inject 20 Units into the skin every morning.   isosorbide mononitrate (IMDUR) 60 MG 24 hr tablet Take 1 tablet by mouth in the evening   meclizine (ANTIVERT) 25 MG tablet Take 1 tablet (25 mg total) by mouth 3 (three) times daily.   metolazone (ZAROXOLYN) 5 MG tablet 5 mg once weekly and as needed on Sunday   senna-docusate (SENOKOT-S) 8.6-50 MG tablet Take 1 tablet by mouth 2 (two) times daily as needed for mild constipation.    tamsulosin (FLOMAX) 0.4 MG CAPS capsule Take 0.4 mg by mouth.   torsemide (DEMADEX) 20 MG tablet Take 40 mg by mouth 2 (two) times daily.     Allergies:   Patient has no known allergies.   Social History   Socioeconomic History   Marital status: Married    Spouse name: Not on file   Number of children: Not on file   Years of education: Not on file   Highest education level: Not on file  Occupational History   Not on file  Tobacco Use   Smoking status: Former    Packs/day: 1.00    Years: 1.50    Pack years: 1.50    Types: Cigarettes   Smokeless tobacco: Never   Tobacco comments:    smoked for only 1.5 years in the 2's  Vaping Use   Vaping Use: Never used  Substance and Sexual Activity   Alcohol use: No    Alcohol/week: 0.0 standard drinks   Drug use: No   Sexual activity: Not on file  Other Topics Concern   Not on file  Social History Narrative   Not on file   Social Determinants of Health   Financial Resource Strain: Not on file  Food Insecurity: Not on file  Transportation Needs:  Not on file  Physical Activity: Not on file  Stress: Not on file  Social Connections: Not on file     Family History: The patient's family history includes Diabetes Mellitus II in his mother; Hypertension in his father.  ROS:   Please see the history of present illness.     All other systems reviewed and are negative.   Risk Assessment/Calculations:           Physical Exam:    VS:  BP 122/60 (BP Location: Right Arm, Patient Position: Sitting, Cuff Size: Normal)    Pulse 65    Ht $R'5\' 10"'db$  (1.778 m)    Wt 212 lb 9.6 oz (96.4 kg)    BMI 30.50 kg/m     Wt Readings from Last 3 Encounters:  07/10/21 212 lb 9.6 oz (96.4 kg)  06/14/21 216 lb (98 kg)  05/24/21 214 lb (97.1 kg)     GEN:  Well nourished, well developed in no acute distress HEENT: Normal NECK: No JVD; No carotid bruits LYMPHATICS: No lymphadenopathy CARDIAC: RRR, no murmurs, rubs, gallops RESPIRATORY:  Clear to auscultation without rales, wheezing or rhonchi  ABDOMEN: Soft, non-tender, non-distended MUSCULOSKELETAL:  No edema; No deformity  SKIN: Warm and dry NEUROLOGIC:  Alert and oriented x 3 PSYCHIATRIC:  Normal affect    EKGs/Labs/Other Studies Reviewed:    The following studies were reviewed today:  EKG 01/23/2020 Normal sinus rhythm left atrial enlargement right bundle branch block left anterior fascicular block 70 bpm   Echocardiogram 10/29/2019 IMPRESSIONS     1. Left ventricular ejection fraction, by estimation, is 55 to 60%. The  left ventricle has normal function. The left ventricle has no regional  wall motion abnormalities. There is moderate left ventricular hypertrophy.  Left ventricular diastolic  parameters are indeterminate.   2. Right ventricular systolic function is normal. The right ventricular  size is normal. Tricuspid regurgitation signal is inadequate for assessing  PA pressure.   3. The mitral valve is grossly normal. Mild mitral valve regurgitation.   4. The aortic valve is  tricuspid, mildly calcified. Aortic valve  regurgitation is not visualized.   5. The inferior vena cava is normal in size with greater than 50%  respiratory variability, suggesting right atrial pressure of 3 mmHg.   6. Trivial to small circumferential pericardial effusion.   Nuclear stress test 07/07/2018 The left ventricular ejection fraction is normal (55-65%). Nuclear stress EF: 57%. No wall motion abnormalities There was no ST segment deviation noted during stress. Defect 1: There is a medium defect of mild severity present in the basal inferior location. This appears secondary to diaphragmatic attenuation artifact This is a low risk study. No significant evidence of ischemia identified.  EKG:  EKG is  ordered today.  The ekg ordered today demonstrates normal sinus rhythm possible left atrial enlargement right bundle branch block left anterior fascicular block 65 bpm  Recent Labs: 11/29/2020: TSH 4.81 03/26/2021: Brain Natriuretic Peptide CANCELED 06/14/2021: ALT 8; BUN 60; Creat 4.42; Hemoglobin 12.2; Platelets 232; Potassium 4.1; Sodium 143  Recent Lipid Panel    Component Value Date/Time   CHOL 178 11/29/2020 0948   TRIG 130 11/29/2020 0948   HDL 35 (L) 11/29/2020 0948   CHOLHDL 5.1 (H) 11/29/2020 0948   LDLCALC 118 (H) 11/29/2020 0948    ASSESSMENT & PLAN     Acute diastolic CHF- euvolemic today.  Weight today 212 pounds.    Previously felt that his worsening renal function was major contributor to his fluid volume excess. Continue carvedilol, hydralazine, metolazone (if needed take  30 minutes prior to torsemide), torsemide. Heart healthy low-sodium diet-salty 6 reviewed Daily weights contact office with a weight increase of 2 pounds overnight or 5 pounds in 1 week. Increase physical activity as tolerated Order echocardiogram  Essential hypertension-BP today 122/60.  Well-controlled at home. Continue amlodipine, Imdur, carvedilol, hydralazine, torsemide, Heart healthy  low-sodium diet-salty 6 reviewed Increase physical activity as tolerated   Chronic stage IV CKD-creatinine 4.42 on 06/14/21.  Had pretransplant evaluation on 05/23/2021.  Instructed patient to have chest pain office and preoperative request form. Follows with nephrology.      Disposition: Follow-up with Dr. Stanford Breed or me in 6 months.        Medication Adjustments/Labs and Tests Ordered: Current medicines are reviewed at length with the patient today.  Concerns regarding medicines are outlined above.  Orders Placed This Encounter  Procedures   ECHOCARDIOGRAM COMPLETE   No orders of the defined types were placed in this encounter.   Patient Instructions  Medication Instructions:  Your Physician recommend you continue on your current medication as directed.    *If you need a refill on your cardiac medications before your next appointment, please call your pharmacy*  Testing/Procedures: Your physician has requested that you have an echocardiogram. Echocardiography is a painless test that uses sound waves to create images of your heart. It provides your doctor with information about the size and shape of your heart and how well your hearts chambers and valves are working. This procedure takes approximately one hour. There are no restrictions for this procedure. [place], you and your health needs are our priority.  As part of our continuing mission to provide you with exceptional heart care, we have created designated Provider Care Teams.  These Care Teams include your primary Cardiologist (physician) and Advanced Practice Providers (APPs -  Physician Assistants and Nurse Practitioners) who all work together to provide you with the care you need, when you need it.  We recommend signing up for the patient portal called "MyChart".  Sign up information is provided on this After Visit Summary.  MyChart is used to connect with  patients for Virtual Visits (Telemedicine).  Patients are able to view lab/test results, encounter notes, upcoming appointments, etc.  Non-urgent messages can be sent to your provider as well.   To learn more about what you can do with MyChart, go to [email or web address].    Your next appointment:   6 month(s)  The format for your next appointment:   In Person  Provider:   Kirk Ruths, MD {    Signed, Deberah Pelton, NP  07/10/2021 9:17 AM      Notice: This dictation was prepared with Dragon dictation along with smaller phrase technology. Any transcriptional errors that result from this process are unintentional and may not be corrected upon review.  I spent 13 minutes examining this patient, reviewing medications, and using patient centered shared decision making involving her cardiac care.  Prior to her visit I spent greater than 20 minutes reviewing her past medical history,  medications, and prior cardiac tests.

## 2021-07-04 ENCOUNTER — Other Ambulatory Visit: Payer: Self-pay

## 2021-07-04 ENCOUNTER — Other Ambulatory Visit: Payer: Medicare HMO

## 2021-07-04 DIAGNOSIS — B789 Strongyloidiasis, unspecified: Secondary | ICD-10-CM

## 2021-07-05 ENCOUNTER — Other Ambulatory Visit: Payer: Self-pay | Admitting: Cardiology

## 2021-07-09 LAB — OVA AND PARASITE EXAMINATION
CONCENTRATE RESULT:: NONE SEEN
MICRO NUMBER:: 13080080
SPECIMEN QUALITY:: ADEQUATE
TRICHROME RESULT:: NONE SEEN

## 2021-07-10 ENCOUNTER — Encounter (HOSPITAL_BASED_OUTPATIENT_CLINIC_OR_DEPARTMENT_OTHER): Payer: Self-pay | Admitting: General Practice

## 2021-07-10 ENCOUNTER — Ambulatory Visit (HOSPITAL_BASED_OUTPATIENT_CLINIC_OR_DEPARTMENT_OTHER): Payer: Medicare HMO | Admitting: General Practice

## 2021-07-10 ENCOUNTER — Other Ambulatory Visit: Payer: Self-pay

## 2021-07-10 VITALS — BP 122/60 | HR 65 | Ht 70.0 in | Wt 212.6 lb

## 2021-07-10 DIAGNOSIS — I5031 Acute diastolic (congestive) heart failure: Secondary | ICD-10-CM | POA: Diagnosis not present

## 2021-07-10 DIAGNOSIS — N184 Chronic kidney disease, stage 4 (severe): Secondary | ICD-10-CM | POA: Diagnosis not present

## 2021-07-10 DIAGNOSIS — E1122 Type 2 diabetes mellitus with diabetic chronic kidney disease: Secondary | ICD-10-CM

## 2021-07-10 DIAGNOSIS — I10 Essential (primary) hypertension: Secondary | ICD-10-CM | POA: Diagnosis not present

## 2021-07-10 NOTE — Addendum Note (Signed)
Addended by: Ernie Hew D on: 07/10/2021 03:52 PM ? ? Modules accepted: Orders ? ?

## 2021-07-10 NOTE — Patient Instructions (Signed)
Medication Instructions:  ?Your Physician recommend you continue on your current medication as directed.   ? ?*If you need a refill on your cardiac medications before your next appointment, please call your pharmacy* ? ?Testing/Procedures: ?Your physician has requested that you have an echocardiogram. Echocardiography is a painless test that uses sound waves to create images of your heart. It provides your doctor with information about the size and shape of your heart and how well your heart?s chambers and valves are working. This procedure takes approximately one hour. There are no restrictions for this procedure. ?[place] ? ? ? ?Follow-Up: ?At [facility], you and your health needs are our priority.  As part of our continuing mission to provide you with exceptional heart care, we have created designated Provider Care Teams.  These Care Teams include your primary Cardiologist (physician) and Advanced Practice Providers (APPs -  Physician Assistants and Nurse Practitioners) who all work together to provide you with the care you need, when you need it. ? ?We recommend signing up for the patient portal called "MyChart".  Sign up information is provided on this After Visit Summary.  MyChart is used to connect with patients for Virtual Visits (Telemedicine).  Patients are able to view lab/test results, encounter notes, upcoming appointments, etc.  Non-urgent messages can be sent to your provider as well.   ?To learn more about what you can do with MyChart, go to [email or web address].   ? ?Your next appointment:   ?6 month(s) ? ?The format for your next appointment:   ?In Person ? ?Provider:   ?Kirk Ruths, MD { ? ?

## 2021-07-16 ENCOUNTER — Ambulatory Visit: Payer: Medicare HMO | Admitting: Infectious Diseases

## 2021-07-22 ENCOUNTER — Inpatient Hospital Stay (HOSPITAL_COMMUNITY)
Admission: EM | Admit: 2021-07-22 | Discharge: 2021-08-12 | DRG: 264 | Disposition: A | Discharge status: Skilled Nursing Facility | Payer: Medicare HMO | Attending: Internal Medicine | Admitting: Internal Medicine

## 2021-07-22 ENCOUNTER — Other Ambulatory Visit: Payer: Self-pay

## 2021-07-22 ENCOUNTER — Encounter (HOSPITAL_COMMUNITY): Payer: Self-pay | Admitting: Internal Medicine

## 2021-07-22 ENCOUNTER — Emergency Department (HOSPITAL_COMMUNITY): Payer: Medicare HMO

## 2021-07-22 DIAGNOSIS — I132 Hypertensive heart and chronic kidney disease with heart failure and with stage 5 chronic kidney disease, or end stage renal disease: Secondary | ICD-10-CM | POA: Diagnosis not present

## 2021-07-22 DIAGNOSIS — E1142 Type 2 diabetes mellitus with diabetic polyneuropathy: Secondary | ICD-10-CM | POA: Diagnosis present

## 2021-07-22 DIAGNOSIS — R0602 Shortness of breath: Secondary | ICD-10-CM | POA: Diagnosis not present

## 2021-07-22 DIAGNOSIS — F419 Anxiety disorder, unspecified: Secondary | ICD-10-CM | POA: Diagnosis present

## 2021-07-22 DIAGNOSIS — N2581 Secondary hyperparathyroidism of renal origin: Secondary | ICD-10-CM | POA: Diagnosis present

## 2021-07-22 DIAGNOSIS — G9341 Metabolic encephalopathy: Secondary | ICD-10-CM | POA: Diagnosis present

## 2021-07-22 DIAGNOSIS — J90 Pleural effusion, not elsewhere classified: Secondary | ICD-10-CM | POA: Diagnosis present

## 2021-07-22 DIAGNOSIS — Z8249 Family history of ischemic heart disease and other diseases of the circulatory system: Secondary | ICD-10-CM

## 2021-07-22 DIAGNOSIS — Z833 Family history of diabetes mellitus: Secondary | ICD-10-CM

## 2021-07-22 DIAGNOSIS — B789 Strongyloidiasis, unspecified: Secondary | ICD-10-CM | POA: Diagnosis present

## 2021-07-22 DIAGNOSIS — I5032 Chronic diastolic (congestive) heart failure: Secondary | ICD-10-CM | POA: Diagnosis present

## 2021-07-22 DIAGNOSIS — N186 End stage renal disease: Secondary | ICD-10-CM | POA: Diagnosis present

## 2021-07-22 DIAGNOSIS — J9621 Acute and chronic respiratory failure with hypoxia: Secondary | ICD-10-CM | POA: Diagnosis not present

## 2021-07-22 DIAGNOSIS — Z992 Dependence on renal dialysis: Secondary | ICD-10-CM

## 2021-07-22 DIAGNOSIS — E785 Hyperlipidemia, unspecified: Secondary | ICD-10-CM | POA: Diagnosis present

## 2021-07-22 DIAGNOSIS — E871 Hypo-osmolality and hyponatremia: Secondary | ICD-10-CM | POA: Diagnosis not present

## 2021-07-22 DIAGNOSIS — Z5320 Procedure and treatment not carried out because of patient's decision for unspecified reasons: Secondary | ICD-10-CM | POA: Diagnosis present

## 2021-07-22 DIAGNOSIS — E119 Type 2 diabetes mellitus without complications: Secondary | ICD-10-CM

## 2021-07-22 DIAGNOSIS — Z20822 Contact with and (suspected) exposure to covid-19: Secondary | ICD-10-CM | POA: Diagnosis present

## 2021-07-22 DIAGNOSIS — D631 Anemia in chronic kidney disease: Secondary | ICD-10-CM | POA: Diagnosis present

## 2021-07-22 DIAGNOSIS — I10 Essential (primary) hypertension: Secondary | ICD-10-CM | POA: Diagnosis present

## 2021-07-22 DIAGNOSIS — Z79899 Other long term (current) drug therapy: Secondary | ICD-10-CM

## 2021-07-22 DIAGNOSIS — Z87891 Personal history of nicotine dependence: Secondary | ICD-10-CM

## 2021-07-22 DIAGNOSIS — E1122 Type 2 diabetes mellitus with diabetic chronic kidney disease: Secondary | ICD-10-CM | POA: Diagnosis present

## 2021-07-22 DIAGNOSIS — E1165 Type 2 diabetes mellitus with hyperglycemia: Secondary | ICD-10-CM | POA: Diagnosis present

## 2021-07-22 DIAGNOSIS — I5033 Acute on chronic diastolic (congestive) heart failure: Secondary | ICD-10-CM | POA: Diagnosis present

## 2021-07-22 DIAGNOSIS — J9622 Acute and chronic respiratory failure with hypercapnia: Secondary | ICD-10-CM | POA: Diagnosis present

## 2021-07-22 DIAGNOSIS — N185 Chronic kidney disease, stage 5: Secondary | ICD-10-CM | POA: Diagnosis present

## 2021-07-22 DIAGNOSIS — Z794 Long term (current) use of insulin: Secondary | ICD-10-CM

## 2021-07-22 DIAGNOSIS — D72820 Lymphocytosis (symptomatic): Secondary | ICD-10-CM | POA: Diagnosis present

## 2021-07-22 DIAGNOSIS — D509 Iron deficiency anemia, unspecified: Secondary | ICD-10-CM | POA: Diagnosis present

## 2021-07-22 DIAGNOSIS — I959 Hypotension, unspecified: Secondary | ICD-10-CM | POA: Diagnosis not present

## 2021-07-22 DIAGNOSIS — Z7982 Long term (current) use of aspirin: Secondary | ICD-10-CM

## 2021-07-22 HISTORY — DX: Chronic kidney disease, stage 4 (severe): N18.4

## 2021-07-22 LAB — CBC WITH DIFFERENTIAL/PLATELET
Abs Immature Granulocytes: 0.04 10*3/uL (ref 0.00–0.07)
Basophils Absolute: 0.1 10*3/uL (ref 0.0–0.1)
Basophils Relative: 1 %
Eosinophils Absolute: 0.3 10*3/uL (ref 0.0–0.5)
Eosinophils Relative: 3 %
HCT: 38.9 % — ABNORMAL LOW (ref 39.0–52.0)
Hemoglobin: 12.3 g/dL — ABNORMAL LOW (ref 13.0–17.0)
Immature Granulocytes: 1 %
Lymphocytes Relative: 17 %
Lymphs Abs: 1.5 10*3/uL (ref 0.7–4.0)
MCH: 28 pg (ref 26.0–34.0)
MCHC: 31.6 g/dL (ref 30.0–36.0)
MCV: 88.6 fL (ref 80.0–100.0)
Monocytes Absolute: 0.7 10*3/uL (ref 0.1–1.0)
Monocytes Relative: 8 %
Neutro Abs: 6.1 10*3/uL (ref 1.7–7.7)
Neutrophils Relative %: 70 %
Platelets: 221 10*3/uL (ref 150–400)
RBC: 4.39 MIL/uL (ref 4.22–5.81)
RDW: 16.2 % — ABNORMAL HIGH (ref 11.5–15.5)
WBC: 8.6 10*3/uL (ref 4.0–10.5)
nRBC: 0 % (ref 0.0–0.2)

## 2021-07-22 LAB — BLOOD GAS, ARTERIAL
Acid-Base Excess: 8.8 mmol/L — ABNORMAL HIGH (ref 0.0–2.0)
Bicarbonate: 37 mmol/L — ABNORMAL HIGH (ref 20.0–28.0)
O2 Saturation: 96.2 %
Patient temperature: 37
pCO2 arterial: 67 mmHg (ref 32–48)
pH, Arterial: 7.35 (ref 7.35–7.45)
pO2, Arterial: 89 mmHg (ref 83–108)

## 2021-07-22 LAB — CBG MONITORING, ED
Glucose-Capillary: 186 mg/dL — ABNORMAL HIGH (ref 70–99)
Glucose-Capillary: 215 mg/dL — ABNORMAL HIGH (ref 70–99)
Glucose-Capillary: 163 mg/dL — ABNORMAL HIGH (ref 70–99)

## 2021-07-22 LAB — I-STAT ARTERIAL BLOOD GAS, ED
Acid-Base Excess: 6 mmol/L — ABNORMAL HIGH (ref 0.0–2.0)
Bicarbonate: 34.2 mmol/L — ABNORMAL HIGH (ref 20.0–28.0)
Calcium, Ion: 1.08 mmol/L — ABNORMAL LOW (ref 1.15–1.40)
HCT: 36 % — ABNORMAL LOW (ref 39.0–52.0)
Hemoglobin: 12.2 g/dL — ABNORMAL LOW (ref 13.0–17.0)
O2 Saturation: 92 %
Patient temperature: 98.6
Potassium: 3.8 mmol/L (ref 3.5–5.1)
Sodium: 138 mmol/L (ref 135–145)
TCO2: 36 mmol/L — ABNORMAL HIGH (ref 22–32)
pCO2 arterial: 64.2 mmHg — ABNORMAL HIGH (ref 32–48)
pH, Arterial: 7.334 — ABNORMAL LOW (ref 7.35–7.45)
pO2, Arterial: 70 mmHg — ABNORMAL LOW (ref 83–108)

## 2021-07-22 LAB — BASIC METABOLIC PANEL
Anion gap: 14 (ref 5–15)
BUN: 66 mg/dL — ABNORMAL HIGH (ref 8–23)
CO2: 31 mmol/L (ref 22–32)
Calcium: 8.6 mg/dL — ABNORMAL LOW (ref 8.9–10.3)
Chloride: 94 mmol/L — ABNORMAL LOW (ref 98–111)
Creatinine, Ser: 5.27 mg/dL — ABNORMAL HIGH (ref 0.61–1.24)
GFR, Estimated: 11 mL/min — ABNORMAL LOW (ref >60)
Glucose, Bld: 193 mg/dL — ABNORMAL HIGH (ref 70–99)
Potassium: 3.5 mmol/L (ref 3.5–5.1)
Sodium: 139 mmol/L (ref 135–145)

## 2021-07-22 LAB — GLUCOSE, CAPILLARY: Glucose-Capillary: 105 mg/dL — ABNORMAL HIGH (ref 70–99)

## 2021-07-22 LAB — TROPONIN I (HIGH SENSITIVITY)
Troponin I (High Sensitivity): 16 ng/L (ref <18)
Troponin I (High Sensitivity): 17 ng/L (ref <18)

## 2021-07-22 LAB — HEMOGLOBIN A1C
Hgb A1c MFr Bld: 7 % — ABNORMAL HIGH (ref 4.8–5.6)
Mean Plasma Glucose: 154.2 mg/dL

## 2021-07-22 LAB — RESP PANEL BY RT-PCR (FLU A&B, COVID) ARPGX2
Influenza A by PCR: NEGATIVE
Influenza B by PCR: NEGATIVE
SARS Coronavirus 2 by RT PCR: NEGATIVE

## 2021-07-22 LAB — BRAIN NATRIURETIC PEPTIDE: B Natriuretic Peptide: 653.9 pg/mL — ABNORMAL HIGH (ref 0.0–100.0)

## 2021-07-22 MED ORDER — CALCIUM CARBONATE ANTACID 1250 MG/5ML PO SUSP: 500.0000 mg | Freq: Four times a day (QID) | ORAL | Status: DC | PRN

## 2021-07-22 MED ORDER — GABAPENTIN 300 MG PO CAPS
300.0000 mg | ORAL_CAPSULE | Freq: Two times a day (BID) | ORAL | Status: DC
  Administered 2021-07-23 – 2021-07-25 (×3): 300 mg via ORAL
  Filled 2021-07-22 (×4): qty 1

## 2021-07-22 MED ORDER — ALBUTEROL SULFATE HFA 108 (90 BASE) MCG/ACT IN AERS: 2.0000 | INHALATION_SPRAY | RESPIRATORY_TRACT | Status: DC | PRN

## 2021-07-22 MED ORDER — INSULIN GLARGINE (2 UNIT DIAL) 300 UNIT/ML ~~LOC~~ SOPN
20.0000 [IU] | PEN_INJECTOR | Freq: Every morning | SUBCUTANEOUS | Status: DC
Start: 2021-07-23 — End: 2021-07-22

## 2021-07-22 MED ORDER — SODIUM CHLORIDE 0.9% FLUSH
3.0000 mL | Freq: Two times a day (BID) | INTRAVENOUS | Status: DC
  Administered 2021-07-22 – 2021-08-11 (×35): 3 mL via INTRAVENOUS

## 2021-07-22 MED ORDER — ISOSORBIDE MONONITRATE ER 60 MG PO TB24
60.0000 mg | ORAL_TABLET | Freq: Every evening | ORAL | Status: DC
Start: 2021-07-22 — End: 2021-08-12
  Administered 2021-07-23 – 2021-08-11 (×19): 60 mg via ORAL
  Filled 2021-07-22 (×21): qty 1

## 2021-07-22 MED ORDER — CALCITRIOL 0.25 MCG PO CAPS
0.2500 ug | ORAL_CAPSULE | ORAL | Status: DC
  Administered 2021-07-23 – 2021-08-12 (×10): 0.25 ug via ORAL
  Filled 2021-07-22 (×11): qty 1

## 2021-07-22 MED ORDER — HYDRALAZINE HCL 50 MG PO TABS
50.0000 mg | ORAL_TABLET | Freq: Three times a day (TID) | ORAL | Status: DC
  Administered 2021-07-23 – 2021-08-03 (×28): 50 mg via ORAL
  Filled 2021-07-22 (×30): qty 1

## 2021-07-22 MED ORDER — SENNOSIDES-DOCUSATE SODIUM 8.6-50 MG PO TABS: 1.0000 | ORAL_TABLET | Freq: Two times a day (BID) | ORAL | Status: DC | PRN

## 2021-07-22 MED ORDER — HEPARIN SODIUM (PORCINE) 5000 UNIT/ML IJ SOLN
5000.0000 [IU] | Freq: Three times a day (TID) | INTRAMUSCULAR | Status: DC
  Administered 2021-07-22 – 2021-08-12 (×59): 5000 [IU] via SUBCUTANEOUS
  Filled 2021-07-22 (×62): qty 1

## 2021-07-22 MED ORDER — SORBITOL 70 % SOLN: 30.0000 mL | Status: DC | PRN

## 2021-07-22 MED ORDER — ALBUTEROL SULFATE (2.5 MG/3ML) 0.083% IN NEBU
2.5000 mg | INHALATION_SOLUTION | RESPIRATORY_TRACT | Status: DC | PRN
Start: 2021-07-22 — End: 2021-08-12

## 2021-07-22 MED ORDER — FUROSEMIDE 10 MG/ML IJ SOLN
120.0000 mg | Freq: Two times a day (BID) | INTRAVENOUS | Status: DC
  Administered 2021-07-22 – 2021-07-25 (×5): 120 mg via INTRAVENOUS
  Filled 2021-07-22: qty 12
  Filled 2021-07-22: qty 2
  Filled 2021-07-22: qty 10
  Filled 2021-07-22 (×5): qty 12
  Filled 2021-07-22 (×2): qty 10

## 2021-07-22 MED ORDER — FUROSEMIDE 10 MG/ML IJ SOLN
120.0000 mg | Freq: Once | INTRAVENOUS | Status: AC
  Administered 2021-07-22: 120 mg via INTRAVENOUS
  Filled 2021-07-22: qty 10

## 2021-07-22 MED ORDER — HYDRALAZINE HCL 20 MG/ML IJ SOLN: 5.0000 mg | INTRAMUSCULAR | Status: DC | PRN

## 2021-07-22 MED ORDER — HYDROXYZINE HCL 25 MG PO TABS
25.0000 mg | ORAL_TABLET | Freq: Two times a day (BID) | ORAL | Status: DC | PRN
  Administered 2021-07-26 – 2021-07-28 (×3): 25 mg via ORAL
  Filled 2021-07-22 (×3): qty 1

## 2021-07-22 MED ORDER — DOCUSATE SODIUM 283 MG RE ENEM: 1.0000 | ENEMA | RECTAL | Status: DC | PRN

## 2021-07-22 MED ORDER — CALCIUM CARBONATE ANTACID 500 MG PO CHEW
2.0000 | CHEWABLE_TABLET | Freq: Every day | ORAL | Status: DC
  Administered 2021-07-23 – 2021-08-11 (×19): 400 mg via ORAL
  Filled 2021-07-22 (×22): qty 2

## 2021-07-22 MED ORDER — NEPRO/CARBSTEADY PO LIQD: 237.0000 mL | Freq: Three times a day (TID) | ORAL | Status: DC | PRN

## 2021-07-22 MED ORDER — CALCIUM ACETATE (PHOS BINDER) 667 MG PO CAPS
667.0000 mg | ORAL_CAPSULE | Freq: Three times a day (TID) | ORAL | Status: DC
  Administered 2021-07-22 – 2021-07-26 (×7): 667 mg via ORAL
  Filled 2021-07-22 (×13): qty 1

## 2021-07-22 MED ORDER — AMLODIPINE BESYLATE 10 MG PO TABS
10.0000 mg | ORAL_TABLET | Freq: Every day | ORAL | Status: DC
  Administered 2021-07-23: 10 mg via ORAL
  Filled 2021-07-22: qty 1

## 2021-07-22 MED ORDER — CARVEDILOL 12.5 MG PO TABS
12.5000 mg | ORAL_TABLET | Freq: Two times a day (BID) | ORAL | Status: DC
Start: 2021-07-22 — End: 2021-07-25
  Administered 2021-07-23 – 2021-07-24 (×3): 12.5 mg via ORAL
  Filled 2021-07-22 (×4): qty 1

## 2021-07-22 MED ORDER — ZOLPIDEM TARTRATE 5 MG PO TABS
5.0000 mg | ORAL_TABLET | Freq: Every evening | ORAL | Status: DC | PRN
  Administered 2021-07-30 – 2021-08-11 (×6): 5 mg via ORAL
  Filled 2021-07-22 (×6): qty 1

## 2021-07-22 MED ORDER — HYDROCODONE-ACETAMINOPHEN 5-325 MG PO TABS
1.0000 | ORAL_TABLET | ORAL | Status: DC | PRN
  Administered 2021-08-03 – 2021-08-11 (×11): 1 via ORAL
  Filled 2021-07-22 (×11): qty 1

## 2021-07-22 MED ORDER — ONDANSETRON HCL 4 MG PO TABS: 4.0000 mg | ORAL_TABLET | Freq: Four times a day (QID) | ORAL | Status: DC | PRN

## 2021-07-22 MED ORDER — ACETAMINOPHEN 325 MG PO TABS
650.0000 mg | ORAL_TABLET | Freq: Four times a day (QID) | ORAL | Status: DC | PRN
  Administered 2021-07-23 – 2021-08-12 (×14): 650 mg via ORAL
  Filled 2021-07-22 (×13): qty 2

## 2021-07-22 MED ORDER — INSULIN GLARGINE-YFGN 100 UNIT/ML ~~LOC~~ SOLN
16.0000 [IU] | Freq: Every day | SUBCUTANEOUS | Status: DC
  Administered 2021-07-23 – 2021-07-25 (×2): 16 [IU] via SUBCUTANEOUS
  Filled 2021-07-22 (×4): qty 0.16

## 2021-07-22 MED ORDER — INSULIN ASPART 100 UNIT/ML IJ SOLN
0.0000 [IU] | Freq: Every day | INTRAMUSCULAR | Status: DC
  Administered 2021-07-23 – 2021-07-25 (×2): 2 [IU] via SUBCUTANEOUS

## 2021-07-22 MED ORDER — ACETAMINOPHEN 650 MG RE SUPP
650.0000 mg | Freq: Four times a day (QID) | RECTAL | Status: DC | PRN
  Filled 2021-07-22: qty 1

## 2021-07-22 MED ORDER — MECLIZINE HCL 25 MG PO TABS
25.0000 mg | ORAL_TABLET | Freq: Two times a day (BID) | ORAL | Status: DC
  Administered 2021-07-23 – 2021-08-12 (×38): 25 mg via ORAL
  Filled 2021-07-22 (×42): qty 1

## 2021-07-22 MED ORDER — ONDANSETRON HCL 4 MG/2ML IJ SOLN
4.0000 mg | Freq: Four times a day (QID) | INTRAMUSCULAR | Status: DC | PRN
  Administered 2021-07-31 – 2021-08-11 (×3): 4 mg via INTRAVENOUS
  Filled 2021-07-22 (×3): qty 2

## 2021-07-22 MED ORDER — INSULIN ASPART 100 UNIT/ML IJ SOLN
0.0000 [IU] | Freq: Three times a day (TID) | INTRAMUSCULAR | Status: DC
  Administered 2021-07-22 – 2021-07-25 (×4): 1 [IU] via SUBCUTANEOUS
  Administered 2021-07-25: 2 [IU] via SUBCUTANEOUS
  Administered 2021-07-26 (×2): 1 [IU] via SUBCUTANEOUS
  Administered 2021-07-26: 2 [IU] via SUBCUTANEOUS
  Administered 2021-07-27: 1 [IU] via SUBCUTANEOUS
  Administered 2021-07-30: 0 [IU] via SUBCUTANEOUS
  Administered 2021-08-01 – 2021-08-04 (×4): 1 [IU] via SUBCUTANEOUS
  Administered 2021-08-05: 2 [IU] via SUBCUTANEOUS
  Administered 2021-08-06 – 2021-08-10 (×7): 1 [IU] via SUBCUTANEOUS
  Administered 2021-08-11: 2 [IU] via SUBCUTANEOUS
  Administered 2021-08-12: 1 [IU] via SUBCUTANEOUS

## 2021-07-22 MED ORDER — ASPIRIN 81 MG PO CHEW
81.0000 mg | CHEWABLE_TABLET | Freq: Every day | ORAL | Status: DC
  Administered 2021-07-23 – 2021-08-12 (×19): 81 mg via ORAL
  Filled 2021-07-22 (×20): qty 1

## 2021-07-22 NOTE — ED Notes (Signed)
Pt's wife at bedside helping pt eat lunch at this time.  ?

## 2021-07-22 NOTE — Consult Note (Signed)
Nephrology Consult  ? ?Requesting provider: [name], PA-C ?Service requesting consult: ED ?Reason for consult: Worsening CKD ? ? ?Assessment/Recommendations: Melvin Mcclure is a/an 71 y.o. male with a past medical history notable for renal failure (transplant candidate; left brachiocephalic AV fistula created 30/09/23 ), diastolic CHF, HTN, R0QT.   ? ?Non-Oliguric Acute on chronic kidney disease stage 5 (stable): Likely secondary to worsening kidney function in the setting of volume overload state 2/2 CHF exacerbation.  ? ?ED work up significant for DBP 56-86. Hgb 12, Cr 5.27 (baseline 4.2-5.0; most recently 4.42), BNP 653.9, CXR w/ right consolidation and pleural effusion.  ? ?He sees Dr. Royce Macadamia in the outpatient setting. He is a renal transplant patient at [facility]. He does make urine. 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 He is followed by ID for this. If kidney function fails to improve over the next 24-48 with diuresis will highly consider starting dialysis. He has AV fistula in placed.  ? ?Volume Status: Appears hypervolemic on exam. Based on our examination and review of available imaging, our recommendation is to continue with diuresis as below. ? ?- Start diuresis 120 mg IV lasix BID; assess UOP ?- Chart reviewed: medications acceptable, does not appear to have been exposed to nephrotoxins with imaging. There are readings with diastolic BP down to 62-26J.  ?-Continue to monitor daily Cr, Dose meds for GFR<15 ?-Monitor Daily I/Os, Daily weight  ?-May obtain urine sample for sediment analysis if AKI fails to improve with efforts stated above.  ?-Maintain MAP>65 for optimal renal perfusion. Consider decreasing dose of antihypertensives  ?-Avoid further nephrotoxins including NSAIDS, Morphine.  Unless absolutely necessary, avoid CT with contrast and/or MRI with gadolinium.    ?-Consider starting HD if no improvement over the next 24-48 hours ? ?Hypertension ?Soft  diastolic pressures. Consider decreasing home BP medications to aid in perfusion if BPs continue to be soft of decrease.  ? ?Electrolytes : K+ 3.5, Na+ 139 ?- AM BMP ? ?Anemia due to chronic disease: ?-Transfuse for Hgb<7 g/dL ?- No IV iron ?-  No role for ESA in setting of AKI ? ?Uncontrolled Diabetes Mellitus Type 2 with Hyperglycemia ?Last documented A1c in epic 7.3 was 2021 ?-manage per primary team ? ?Dyspnea, Acute CHF exacerbation  Hx of elevated trop ?Home toresemide 40 mg BID.  ?-Hold home med ?-Diuresis as above ?- Otherwise management by primary team; planning for thoracentesis  ? ?Gerlene Fee, DO ?07/22/2021, 10:50 AM ?PGY-3, [place] ? ?_____________________________________________________________________________________ ? ? ?History of Present Illness: Melvin Mcclure is a/an 71 y.o. male with a past medical history of renal failure (transplant candidate; left brachiocephalic AV fistula created 33/54/56 ), diastolic CHF, HTN, Y5WL who presents to [facility] with 3 days of worsening shortness of breath. ? ?He was in his usual state of health until 3 days ago when his wife noticed he was moving slower and having shortness of breath. He has been taking all medication as prescribe and recently seen his primary care doctor and nephrology and was doing well at those appointments. He has home oxygen and has to use it sparingly for 2 years. He does not usually get swelling in his legs; it is in his abdomen. The wife and patient agree that his abdomen is more distended than usual today.  ? ?Denies change in amount or color of urine.  ? ?They deny new medications or foods. He appears to be at his baseline mental status and denies emesis. Does endorse leg  cramps and recent fatigue per his wife.  ? ?Medications:  ?No current facility-administered medications for this encounter.  ? ?Current Outpatient Medications  ?Medication Sig Dispense Refill  ? acetaminophen (TYLENOL) 500 MG tablet Take  500-1,000 mg by mouth every 6 (six) hours as needed for moderate pain or headache.    ? amLODipine (NORVASC) 10 MG tablet Take 10 mg by mouth daily.    ? aspirin 81 MG tablet Take 81 mg by mouth daily.    ? blood glucose meter kit and supplies KIT Dispense based on patient and insurance preference. Use up to four times daily as directed. (FOR ICD-9 250.00, 250.01). 1 each 0  ? calcitRIOL (ROCALTROL) 0.25 MCG capsule Take 0.25 mcg by mouth every other day.    ? calcium acetate (PHOSLO) 667 MG capsule Take 1 capsule (667 mg total) by mouth 3 (three) times daily with meals. 90 capsule 1  ? calcium carbonate (TUMS - DOSED IN MG ELEMENTAL CALCIUM) 500 MG chewable tablet Chew 2 tablets by mouth at bedtime.    ? carvedilol (COREG) 12.5 MG tablet TAKE 1 TABLET BY MOUTH TWICE DAILY WITH A MEAL (Patient taking differently: Take 12.5 mg by mouth 2 (two) times daily with a meal.) 180 tablet 3  ? diclofenac Sodium (VOLTAREN) 1 % GEL Apply 2 g topically 4 (four) times daily. (Patient taking differently: Apply 2 g topically 4 (four) times daily as needed (pain).)    ? gabapentin (NEURONTIN) 300 MG capsule Take 1 capsule (300 mg total) by mouth 2 (two) times daily. 60 capsule 0  ? hydrALAZINE (APRESOLINE) 50 MG tablet Take 1 tablet (50 mg total) by mouth every 8 (eight) hours. (Patient taking differently: Take 50 mg by mouth 3 (three) times daily.) 90 tablet 0  ? HYDROcodone-acetaminophen (NORCO/VICODIN) 5-325 MG tablet Take 1 tablet by mouth every 4 (four) hours as needed for moderate pain. 10 tablet 0  ? hydrOXYzine (ATARAX/VISTARIL) 25 MG tablet Take 25 mg by mouth 2 (two) times daily as needed for anxiety.    ? Insulin Glargine (TOUJEO MAX SOLOSTAR [place]) Inject 20 Units into the skin every morning.    ? isosorbide mononitrate (IMDUR) 60 MG 24 hr tablet Take 1 tablet by mouth in the evening (Patient taking differently: Take 60 mg by mouth every evening.) 90 tablet 0  ? meclizine (ANTIVERT) 25 MG tablet Take 1 tablet (25 mg total)  by mouth 3 (three) times daily. (Patient taking differently: Take 25 mg by mouth 2 (two) times daily.) 30 tablet 0  ? metolazone (ZAROXOLYN) 5 MG tablet Take 5 mg by mouth every Sunday.    ? senna-docusate (SENOKOT-S) 8.6-50 MG tablet Take 1 tablet by mouth 2 (two) times daily as needed for mild constipation.     ? torsemide (DEMADEX) 20 MG tablet Take 40 mg by mouth 2 (two) times daily.    ?  ? ?ALLERGIES ?Patient has no known allergies. ? ?MEDICAL HISTORY ?Past Medical History:  ?Diagnosis Date  ? Anemia in chronic kidney disease (CKD)   ? stage 5  ? Anxiety   ? Arthritis   ? CHF (congestive heart failure) ([place]) 05/28/2018  ? dx 05/28/18  ? Diabetes mellitus without complication ([place])   ? Hyperlipidemia   ? Hypertension   ? Iron deficiency anemia   ? Neuromuscular disorder ([place])   ? neuropathy feet/hands  ? Perforating neurotrophic ulcer of foot ([place])   ?  ? ?SOCIAL HISTORY ?Social History  ? ?Socioeconomic History  ? Marital status:  Married  ?  Spouse name: Not on file  ? Number of children: Not on file  ? Years of education: Not on file  ? Highest education level: Not on file  ?Occupational History  ? Not on file  ?Tobacco Use  ? Smoking status: Former  ?  Packs/day: 1.00  ?  Years: 1.50  ?  Pack years: 1.50  ?  Types: Cigarettes  ? Smokeless tobacco: Never  ? Tobacco comments:  ?  smoked for only 1.5 years in the 70's  ?Vaping Use  ? Vaping Use: Never used  ?Substance and Sexual Activity  ? Alcohol use: No  ?  Alcohol/week: 0.0 standard drinks  ? Drug use: No  ? Sexual activity: Not on file  ?Other Topics Concern  ? Not on file  ?Social History Narrative  ? Not on file  ? ?Social Determinants of Health  ? ?Financial Resource Strain: Not on file  ?Food Insecurity: Not on file  ?Transportation Needs: Not on file  ?Physical Activity: Not on file  ?Stress: Not on file  ?Social Connections: Not on file  ?Intimate Partner Violence: Not on file  ?  ? ?FAMILY HISTORY ?Family History  ?Problem Relation Age of Onset  ?  Diabetes Mellitus II Mother   ? Hypertension Father   ?  ? ?Family history of kidney disease unknown ? ?Review of Systems: ?12 systems reviewed ?Otherwise as per HPI, all other systems reviewed and negative ? ?[place]

## 2021-07-22 NOTE — Progress Notes (Signed)
Inpatient Diabetes Program Recommendations ? ?AACE/ADA: New Consensus Statement on Inpatient Glycemic Control (2015) ? ?Target Ranges:  Prepandial:   less than 140 mg/dL ?     Peak postprandial:   less than 180 mg/dL (1-2 hours) ?     Critically ill patients:  140 - 180 mg/dL  ? ?Lab Results  ?Component Value Date  ? GLUCAP 186 (H) 07/22/2021  ? HGBA1C 7.3 (H) 04/18/2020  ? ? ?Review of Glycemic Control ? Latest Reference Range & Units 03/21/21 10:06 03/21/21 11:39 07/22/21 11:28  ?Glucose-Capillary 70 - 99 mg/dL 113 (H) 123 (H) 186 (H)  ?(H): Data is abnormally high ?Diabetes history: Type 2 DM ?Outpatient Diabetes medications: Toujeo 20 units QD ?Current orders for Inpatient glycemic control: Toujeo 20 units QD, Novolog 0-6 units TID & HS ? ?Inpatient Diabetes Program Recommendations:   ? ?Noted consult. A1C ordered and needs to be collected. Of note, Toujeo not on formulary would change to [facility] and consider reduction to 14 units QD.  ? ?Thanks, ?Bronson Curb, MSN, RNC-OB ?Diabetes Coordinator ?[phone] (8a-5p) ? ? ? ? ?

## 2021-07-22 NOTE — ED Notes (Signed)
88-90% on 2 LPM, increased back to 4LPM ?

## 2021-07-22 NOTE — Assessment & Plan Note (Addendum)
Volume being managed with hemodialysis.   ?2D echocardiogram done 07/23/2021 shows preserved LV function with LV ejection fraction of 55 to 60% with no regional wall motion abnormality and grade 2 diastolic dysfunction. ?

## 2021-07-22 NOTE — Assessment & Plan Note (Addendum)
Recurrent pleural effusion ?Chronic bibasilar infiltrate ? ?Underwent thoracentesis.  Seen by pulmonology.   ?Pleural effusion likely due to combination of fluid overload as well as infection. ?Status post thoracocentesis on  07/23/2021 with removal of 1500 mL of pleural fluid.  Pleural fluid culture negative in 24 hours.  Additionally, patient underwent flexible bronchoscopy with brushing and bronchoalveolar lavage and transbronchial lung biopsy on 07/24/2021.  ?BAL positive for Prevotella.  Completed course of Augmentin. Follow fungal cultures (still pending as of 4/4).   ?Patient remains afebrile.  WBC better but minimally elevated.  ?Respiratory status has improved.  Now saturating normal on room air.  ?

## 2021-07-22 NOTE — ED Triage Notes (Signed)
Pt reported to ED with c/o shortness of breath. Per, ER tech, pt was noted to O2 saturation of 75% room air. 4L via nasal cannula applied to pt with increase in saturation to 99%. States shortness of breath has been ongoing for one week. Denies any chest pain at this time.  ?

## 2021-07-22 NOTE — Progress Notes (Signed)
RT assisted RN with transportation of this pt from ED34 to 3E19 while on BiPAP. Pt tolerated well with SVS. ?

## 2021-07-22 NOTE — Progress Notes (Signed)
Patient placed on bipap per MD order.  Patient tolerating well at this time.  Will continue to monitor.  ?

## 2021-07-22 NOTE — ED Notes (Signed)
Pt noted to be lethargic and is having subtle episodic confusion. Pt's wife states pt has not been sleeping much for last few days. CBG assessed as noted and Dr. Lorin Mercy notified. Received new order for ABG. Will continue to monitor.  ?

## 2021-07-22 NOTE — Consult Note (Signed)
? ?NAME:  Melvin Mcclure, MRN:  [id number], DOB:  [birth year], LOS: 0 ?ADMISSION DATE:  07/22/2021, CONSULTATION DATE:  07/22/21 ?REFERRING MD:  Dr. Lorin Mercy, CHIEF COMPLAINT:  Recurrent pleural effusion ? ?History of Present Illness:  ?71 year old gentleman with past medical history of CKD 5, HFpEF, type 2 diabetes, strongyloidiasis infection status post ivermectin treatment, who presents to the hospital for worsening shortness of breath 3-4 days ago.  He is on 2 L of oxygen at home.  He denies coughing, sputum production or wheezing.  Denies fever, night sweat or weight loss.  Reports compliance to diuretics but urine output has been decreased.  He quit smoking in 1960s. ? ?Patient have history of recurrent right pleural effusion.  Has had thoracentesis in 12/2019, 04/2020 and most recently 05/24/2021.  Lights criteria for all of them were transudative. ? ?Pertinent  Medical History  ? ?Past Medical History:  ?Diagnosis Date  ? Anemia in chronic kidney disease (CKD)   ? stage 5  ? Anxiety   ? Arthritis   ? CHF (congestive heart failure) ([place]) 05/28/2018  ? dx 05/28/18  ? Diabetes mellitus without complication ([place])   ? Hyperlipidemia   ? Hypertension   ? Iron deficiency anemia   ? Neuromuscular disorder ([place])   ? neuropathy feet/hands  ? Perforating neurotrophic ulcer of foot ([place])   ? Stage 4 chronic kidney disease ([place])   ?  ? ?Significant Hospital Events: ?Including procedures, antibiotic start and stop dates in addition to other pertinent events   ? ? ?Interim History / Subjective:  ?He denies coughing, sputum production or wheezing.  Denies fever, night sweat or weight loss.  Reports compliance to diuretics but urine output has been decreased.   ? ?Objective   ?Blood pressure (!) 120/41, pulse 65, temperature 98.6 ?F (37 ?C), temperature source Oral, resp. rate 19, SpO2 95 %. ?   ?   ?No intake or output data in the 24 hours ending 07/22/21 1441 ?There were no vitals filed for this visit. ? ?Examination: ?General:  Somnolent but awakes to verbal stimulation ?HENT: Sclera anicteric.   ?Lungs: Diminished breath sounds at the right lung base.  Left-sided side clear. ?Cardiovascular: Regular rate rhythm, no murmurs ?Abdomen: Soft, nontender, normal bowel sound ?Extremities: No LE edema ?Neuro: Oriented x4 ? ? ?Resolved Hospital Problem list   ? ? ?Assessment & Plan:  ?Acute hypoxic/hypercapnic respiratory failure ?Recurrent right pleural effusions ?Acute HFpEF exacerbation in the setting of worsening kidney function ?His pleural effusion could be secondary to HFpEF exacerbation in setting of worsening kidney function because all of his thoracentesis was transudative.  However, given how quickly the pleural effusion accumulates and strictly localized to the right side, there could be other underlying lung issue.  He does not have any other signs of volume overload except for the pleural effusion.  The increasing elevated cell counts and lymphocyte predominant also suspicious for possible malignancy. ?CT chest was done in the past but could not evaluate for right lung parenchyma with ongoing pleural effusion.  Low suspicion for parapneumonic effusion.  Low suspicion for strongyloidiasis associated given no eosinophils in the pleural fluid. ? ?-Plan for thoracentesis tomorrow.  We will send out flow cytometry to better characterize the pleural fluid lymphocytes ?-Will repeat a chest CT without contrast after making sure lungs are dry. ?-Diuresed with IV Lasix per nephrology.  He may need HD if not effectively diuresed. ?-Placed on BiPAP for hypercapnia.  He may have underlying sleep apnea as well.  Will  repeat ABG in a few hours ? ? ?Best Practice (right click and "Reselect all SmartList Selections" daily)  ? ?Diet/type: renal/carn ?DVT prophylaxis: heparin ?GI prophylaxis: NA ?Lines: NA ?Foley:  NA ?Code Status:  full code ?Last date of multidisciplinary goals of care discussion [discussed with wife] ? ?Labs   ?CBC: ?Recent Labs   ?Lab 07/22/21 ?0650 07/22/21 ?1408  ?WBC 8.6  --   ?NEUTROABS 6.1  --   ?HGB 12.3* 12.2*  ?HCT 38.9* 36.0*  ?MCV 88.6  --   ?PLT 221  --   ? ? ?Basic Metabolic Panel: ?Recent Labs  ?Lab 07/22/21 ?0650 07/22/21 ?1408  ?NA 139 138  ?K 3.5 3.8  ?CL 94*  --   ?CO2 31  --   ?GLUCOSE 193*  --   ?BUN 66*  --   ?CREATININE 5.27*  --   ?CALCIUM 8.6*  --   ? ?GFR: ?Estimated Creatinine Clearance: 15 mL/min (A) (by C-G formula based on SCr of 5.27 mg/dL (H)). ?Recent Labs  ?Lab 07/22/21 ?0650  ?WBC 8.6  ? ? ?Liver Function Tests: ?No results for input(s): AST, ALT, ALKPHOS, BILITOT, PROT, ALBUMIN in the last 168 hours. ?No results for input(s): LIPASE, AMYLASE in the last 168 hours. ?No results for input(s): AMMONIA in the last 168 hours. ? ?ABG ?   ?Component Value Date/Time  ? PHART 7.334 (L) 07/22/2021 1408  ? PCO2ART 64.2 (H) 07/22/2021 1408  ? PO2ART 70 (L) 07/22/2021 1408  ? HCO3 34.2 (H) 07/22/2021 1408  ? TCO2 36 (H) 07/22/2021 1408  ? O2SAT 92 07/22/2021 1408  ?  ? ?Coagulation Profile: ?No results for input(s): INR, PROTIME in the last 168 hours. ? ?Cardiac Enzymes: ?No results for input(s): CKTOTAL, CKMB, CKMBINDEX, TROPONINI in the last 168 hours. ? ?HbA1C: ?Hgb A1c MFr Bld  ?Date/Time Value Ref Range Status  ?04/18/2020 02:35 AM 7.3 (H) 4.8 - 5.6 % Final  ?  Comment:  ?  (NOTE) ?Pre diabetes:          5.7%-6.4% ? ?Diabetes:              >6.4% ? ?Glycemic control for   <7.0% ?adults with diabetes ?  ?12/29/2019 12:20 PM 6.6 (H) 4.8 - 5.6 % Final  ?  Comment:  ?  (NOTE) ?Pre diabetes:          5.7%-6.4% ? ?Diabetes:              >6.4% ? ?Glycemic control for   <7.0% ?adults with diabetes ?  ? ? ?CBG: ?Recent Labs  ?Lab 07/22/21 ?1128 07/22/21 ?1328  ?GLUCAP 186* 215*  ? ? ?Review of Systems:   ? ? ?Past Medical History:  ?He,  has a past medical history of Anemia in chronic kidney disease (CKD), Anxiety, Arthritis, CHF (congestive heart failure) ([facility]) (05/28/2018), Diabetes mellitus without complication ([facility]),  Hyperlipidemia, Hypertension, Iron deficiency anemia, Neuromuscular disorder ([facility]), Perforating neurotrophic ulcer of foot ([facility]), and Stage 4 chronic kidney disease ([facility]).  ? ?Surgical History:  ? ?Past Surgical History:  ?Procedure Laterality Date  ? ACHILLES TENDON REPAIR Bilateral   ? AV FISTULA PLACEMENT Left 03/21/2021  ? Procedure: LEFT BRACHIOCEPHALIC ARTERIOVENOUS (AV) FISTULA CREATION;  Surgeon: Hawken, Thomas N, MD;  Location: [facility] OR;  Service: Vascular;  Laterality: Left;  ? COLONOSCOPY    ? CYST EXCISION    ? lipoma removed from arm  ? IR THORACENTESIS ASP PLEURAL SPACE W/IMG GUIDE  12/26/2019  ? IR THORACENTESIS ASP PLEURAL SPACE W/IMG   GUIDE  01/18/2020  ? THORACENTESIS  04/16/2020  ?  ? ?Social History:  ? reports that he has quit smoking. His smoking use included cigarettes. He has a 1.50 pack-year smoking history. He has never used smokeless tobacco. He reports that he does not drink alcohol and does not use drugs.  ? ?Family History:  ?His family history includes Diabetes Mellitus II in his mother; Hypertension in his father.  ? ?Allergies ?No Known Allergies  ? ?Home Medications  ?Prior to Admission medications   ?Medication Sig Start Date End Date Taking? Authorizing Provider  ?acetaminophen (TYLENOL) 500 MG tablet Take 500-1,000 mg by mouth every 6 (six) hours as needed for moderate pain or headache.   Yes [provider]  ?amLODipine (NORVASC) 10 MG tablet Take 10 mg by mouth daily.   Yes [provider]  ?aspirin 81 MG tablet Take 81 mg by mouth daily.   Yes [provider]  ?blood glucose meter kit and supplies KIT Dispense based on patient and insurance preference. Use up to four times daily as directed. (FOR ICD-9 250.00, 250.01). 04/21/20  Yes Hosie Poisson, MD  ?calcitRIOL (ROCALTROL) 0.25 MCG capsule Take 0.25 mcg by mouth every other day. 10/18/19  Yes [provider]  ?calcium acetate (PHOSLO) 667 MG capsule Take 1 capsule (667 mg total) by mouth 3  (three) times daily with meals. 04/21/20  Yes Hosie Poisson, MD  ?calcium carbonate (TUMS - DOSED IN MG ELEMENTAL CALCIUM) 500 MG chewable tablet Chew 2 tablets by mouth at bedtime.   Yes [provider]

## 2021-07-22 NOTE — ED Notes (Signed)
The handoff report received from Amedeo Gory, RN. Vital stable and the patient is on BIPAP.  ABG will be drawn at 19:00 per Dr. Alease Frame Nguyen's secure chat conversation with care team before taking medications.  ?

## 2021-07-22 NOTE — ED Notes (Signed)
Pulmonary team at pt's bedside. They were notified of ABG results and will be ordering Bipap. RT and Dr. Lorin Mercy notified.  ?

## 2021-07-22 NOTE — Assessment & Plan Note (Addendum)
Drop in blood pressure noted while on dialysis.  Dose of hydralazine was decreased by nephrology.  Also remains on Imdur.   ?He was taken off of amlodipine and carvedilol previously.   ?Blood pressure stable the last 24 hours ?

## 2021-07-22 NOTE — Assessment & Plan Note (Signed)
-  Should have been effectively treated with Ivermectin x 2 rounds ?-F/u with ID and/or transplant as needed ?

## 2021-07-22 NOTE — ED Notes (Signed)
Pt on BiPAP and tolerating well. Pt resting with even, unlabored, resp noted.  ?

## 2021-07-22 NOTE — Progress Notes (Signed)
eLink Physician-Brief Progress Note ?Patient Name: Melvin Mcclure ?DOB: Sep 28, 1950 ?MRN: [id number] ? ? ?Date of Service ? 07/22/2021  ?HPI/Events of Note ? ABG on BiPAP = 7.35/67/89/37.  ?eICU Interventions ? Continue present management.  ? ? ? ?Intervention Category ?Major Interventions: Other: ? ?Isha Seefeld Cornelia Copa ?07/22/2021, 9:27 PM ?

## 2021-07-22 NOTE — Assessment & Plan Note (Addendum)
Progression to End-stage renal disease ?Hyponatremia ? ?Progression of kidney disease during this admission.  Nephrology was consulted.  Initially given high-dose furosemide without improvement.  Subsequently started on hemodialysis. ?Currently being dialyzed on a Monday Wednesday Friday schedule.  He is being transitioned to a Tuesday Thursday Saturday schedule which is what is being planned in the outpatient setting.   ?

## 2021-07-22 NOTE — Assessment & Plan Note (Deleted)
Pulmonary on board.  Status post thoracocentesis 07/23/2021,flexible bronchoscopy with brushing and bronchoalveolar lavage and transbronchial lung biopsy on 07/24/2021 will follow recommendations. ?

## 2021-07-22 NOTE — ED Notes (Signed)
Noted a pulsating thrill to L arm dialysis graft.  ?

## 2021-07-22 NOTE — Assessment & Plan Note (Addendum)
Noted to be on SSI and glargine.   HbA1c 7.0.   ?Glargine dose had to be adjusted due to episodes of low glucose levels followed by hyperglycemia.  Stable for the most part in the last 24 hours.  Will not make any further adjustments today. ?

## 2021-07-22 NOTE — H&P (Signed)
History and Physical    Patient: Melvin Mcclure VHQ:[id number] DOB: 04-21-1951 DOA: 07/22/2021 DOS: the patient was seen and examined on 07/22/2021 PCP: Fleet Contras, MD  Patient coming from: Home - lives with wife; Jackey Loge: Wife, [phone]   Chief Complaint: SOB  HPI: Melvin Mcclure is a 71 y.o. 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 His wife reports h/o renal failure, no HD yet but they have a shunt in case it is needed.  He saw his PCP Thursday for routine f/u.  He had a few episodes of SOB last week, had to use prn O2 and he would bounce back.  He got more winded yesterday, was in the 76s.  It was not improving with prn O2.  He has a thoracentesis about 2 months ago.  He is being screened for renal transplant at [facility] but he is on hold for now due to strongyloidiasis (treated by ID), pulm issues (saw pulm and had thoracentesis), and elevated troponin (saw cards last week and he is scheduled for echo this week).  There was concern for PNA on his [facility] evaluation prior.  He has not been having respiratory symptoms.  Periodic SOB, episodic.  He wasn't really having acute symptoms when he had the thoracentesis a couple of months ago.    ER Course:  Wears 2L prn home O2.  Came in for SOB, 75% on RA.  On 4L now.  Diminished breath sounds on the R with pleural effusion.   Worsening renal function.  Nephrology consulted.       Review of Systems: As mentioned in the history of present illness. All other systems reviewed and are negative. Past Medical History:  Diagnosis Date   Anemia in chronic kidney disease (CKD)    stage 5   Anxiety    Arthritis    CHF (congestive heart failure) (HCC) 05/28/2018   dx 05/28/18   Diabetes mellitus without complication (HCC)    Hyperlipidemia    Hypertension    Iron deficiency anemia     Neuromuscular disorder (HCC)    neuropathy feet/hands   Perforating neurotrophic ulcer of foot (HCC)    Stage 4 chronic kidney disease (HCC)    Past Surgical History:  Procedure Laterality Date   ACHILLES TENDON REPAIR Bilateral    AV FISTULA PLACEMENT Left 03/21/2021   Procedure: LEFT BRACHIOCEPHALIC ARTERIOVENOUS (AV) FISTULA CREATION;  Surgeon: Leonie Douglas, MD;  Location: [facility] OR;  Service: Vascular;  Laterality: Left;   COLONOSCOPY     CYST EXCISION     lipoma removed from arm   IR THORACENTESIS ASP PLEURAL SPACE W/IMG GUIDE  12/26/2019   IR THORACENTESIS ASP PLEURAL SPACE W/IMG GUIDE  01/18/2020   THORACENTESIS  04/16/2020   Social History:  reports that he has quit smoking. His smoking use included cigarettes. He has a 1.50 pack-year smoking history. He has never used smokeless tobacco. He reports that he does not drink alcohol and does not use drugs.  No Known Allergies  Family History  Problem Relation Age of Onset   Diabetes Mellitus II Mother    Hypertension Father     Prior to Admission medications   Medication Sig Start Date End Date Taking? Authorizing Provider  acetaminophen (TYLENOL) 500 MG tablet Take 500-1,000 mg by mouth every 6 (six) hours as needed for moderate pain or headache.   Yes [provider]  amLODipine (NORVASC) 10 MG tablet Take 10 mg by mouth daily.   Yes [provider]  aspirin 81 MG tablet Take 81 mg by mouth daily.   Yes [provider]  blood glucose meter kit and supplies KIT Dispense based on patient and insurance preference. Use up to four times daily as directed. (FOR ICD-9 250.00, 250.01). 04/21/20  Yes Kathlen Mody, MD  calcitRIOL (ROCALTROL) 0.25 MCG capsule Take 0.25 mcg by mouth every other day. 10/18/19  Yes [provider]  calcium acetate (PHOSLO) 667 MG capsule Take 1 capsule (667 mg total) by mouth 3 (three) times daily with meals. 04/21/20  Yes Kathlen Mody, MD  calcium carbonate (TUMS -  DOSED IN MG ELEMENTAL CALCIUM) 500 MG chewable tablet Chew 2 tablets by mouth at bedtime.   Yes [provider]  carvedilol (COREG) 12.5 MG tablet TAKE 1 TABLET BY MOUTH TWICE DAILY WITH A MEAL Patient taking differently: Take 12.5 mg by mouth 2 (two) times daily with a meal. 07/08/21  Yes Crenshaw, Madolyn Frieze, MD  diclofenac Sodium (VOLTAREN) 1 % GEL Apply 2 g topically 4 (four) times daily. Patient taking differently: Apply 2 g topically 4 (four) times daily as needed (pain). 12/31/19  Yes Vann, Jessica U, DO  gabapentin (NEURONTIN) 300 MG capsule Take 1 capsule (300 mg total) by mouth 2 (two) times daily. 12/31/19  Yes Marlin Canary U, DO  hydrALAZINE (APRESOLINE) 50 MG tablet Take 1 tablet (50 mg total) by mouth every 8 (eight) hours. Patient taking differently: Take 50 mg by mouth 3 (three) times daily. 06/04/18  Yes Noralee Stain, DO  HYDROcodone-acetaminophen (NORCO/VICODIN) 5-325 MG tablet Take 1 tablet by mouth every 4 (four) hours as needed for moderate pain. 03/21/21 03/21/22 Yes Setzer, Lynnell Jude, PA-C  hydrOXYzine (ATARAX/VISTARIL) 25 MG tablet Take 25 mg by mouth 2 (two) times daily as needed for anxiety. 04/13/20  Yes [provider]  Insulin Glargine (TOUJEO MAX SOLOSTAR [place]) Inject 20 Units into the skin every morning.   Yes [provider]  isosorbide mononitrate (IMDUR) 60 MG 24 hr tablet Take 1 tablet by mouth in the evening Patient taking differently: Take 60 mg by mouth every evening. 06/14/21  Yes Lewayne Bunting, MD  meclizine (ANTIVERT) 25 MG tablet Take 1 tablet (25 mg total) by mouth 3 (three) times daily. Patient taking differently: Take 25 mg by mouth 2 (two) times daily. 12/31/19  Yes Joseph Art, DO  metolazone (ZAROXOLYN) 5 MG tablet Take 5 mg by mouth every Sunday.   Yes [provider]  senna-docusate (SENOKOT-S) 8.6-50 MG tablet Take 1 tablet by mouth 2 (two) times daily as needed for mild constipation.    Yes [provider]   torsemide (DEMADEX) 20 MG tablet Take 40 mg by mouth 2 (two) times daily.   Yes [provider]    Physical Exam: Vitals:   07/22/21 0915 07/22/21 0945 07/22/21 1000 07/22/21 1100  BP: 131/60 137/68 129/62 127/70  Pulse: 65 65 65 65  Resp: 14 18 20 17   Temp:      TempSrc:      SpO2: 95% 94% 94% 96%   General:  Appears calm and comfortable and is in NAD  Eyes:  PERRL, EOMI, normal lids, iris ENT:   hard of hearing, grossly normal lips & tongue, mmm; poor/absent dentition Neck:  no LAD, masses or thyromegaly Cardiovascular:  RRR, no m/r/g. No LE edema.  Respiratory:   CTA bilaterally with no wheezes/rales/rhonchi.  Normal respiratory effort. Abdomen:  soft, NT, ND Skin:  no rash or induration seen on limited exam Musculoskeletal:  grossly normal tone BUE/BLE, good ROM, no bony abnormality Psychiatric:  blunted mood and affect, speech fluent and appropriate, AOx3 Neurologic:  CN 2-12 grossly intact, moves all extremities in coordinated fashion   Radiological Exams on Admission: Independently reviewed - see discussion in A/P where applicable  DG Chest Portable 1 View  Result Date: 07/22/2021 CLINICAL DATA:  Shortness of breath in a 71 year old male. EXAM: PORTABLE CHEST 1 VIEW COMPARISON:  May 28, 2021. FINDINGS: EKG leads project over the chest. Trachea midline. Cardiomediastinal contours with signs of cardiac enlargement, at least moderate. Near complete opacification of the RIGHT mid and lower chest, obscured RIGHT hemidiaphragm. Minimal opacity at the LEFT lung base. No pneumothorax. On limited assessment there is no acute skeletal finding. IMPRESSION: 1. Signs of pleural fluid and RIGHT lower lobe consolidation with marked worsening since previous imaging from January of 2023. Findings could represent infection in the appropriate clinical setting. Given persistence of basilar consolidation and recurrent effusions contrasted CT of the chest on follow-up may be helpful to  exclude central pulmonary neoplasm. 2. Minimal opacity at the LEFT lung base likely atelectasis. 3. Cardiomegaly. Electronically Signed   By: Donzetta Kohut M.D.   On: 07/22/2021 08:23    EKG: Independently reviewed.  NSR with rate 71; nonspecific ST changes with no evidence of acute ischemia   Labs on Admission: I have personally reviewed the available labs and imaging studies at the time of the admission.  Pertinent labs:    Glucose 193 BUN 66/Creatinine 5.27/GFR 11; 4.42 on 2/10 BNP 653.9 HS troponin 16, 17 COVID/flu negative    Assessment and Plan: * Acute on chronic respiratory failure with hypoxia (HCC) -Patient with h/o recurrent pleural effusion and progressive renal disease presenting with hypoxic respiratory failure (into the 70s on room air) -He does wear prn [place] O2 but rarely and only transiently needs it -This time, his hypoxia wouldn't improve -SOB is his only respiratory symptom, no apparent infectious symptoms -Imaging showed worsening R pleural effusion, consolidation -Will consult pulm to do thoracentesis and evaluate underlying lung issue -Based on progressive renal dysfunction, effusion is most likely related to CKD -Will observe on telemetry for now  Stage 5 chronic kidney disease (HCC) -Patient with stage 4-5 CKD but not yet on HD -He has a functioning graft and is also being evaluated for possible future transplant -Nephrology prn order set utilized -He does not appear to be volume overloaded or otherwise in need of acute HD -Nephrology is consulting  Recurrent pleural effusion on right -Pulm consult for drainage, as this would be anticipated to improve his hypoxic respiratory failure  Chronic diastolic CHF (congestive heart failure) (HCC) -May be approaching time when he is unable to clear extra fluid due to his worsening renal failure -Can try diuretics for now (will defer to nephrology), but he appears to be nearing a time when HD is  needed  Strongyloidiasis -Should have been effectively treated with Ivermectin x 2 rounds -F/u with ID and/or transplant as needed  HTN (hypertension) -Continue amlodipine, hydralazine, carvedilol -Will add prn IV hydralazine  DM2 (diabetes mellitus, type 2) (HCC) -Will check A1c -Continue Toujeo -Cover with very sensitive-scale SSI     Advance Care Planning:   Code Status: Full Code   Consults: Nephrology; pulmonology; DM coordinator  DVT Prophylaxis: Heparin  Family Communication: Wife was present throughout evaluation  Severity of Illness: The  appropriate patient status for this patient is OBSERVATION. Observation status is judged to be reasonable and necessary in order to provide the required intensity of service to ensure the patient's safety. The patient's presenting symptoms, physical exam findings, and initial radiographic and laboratory data in the context of their medical condition is felt to place them at decreased risk for further clinical deterioration. Furthermore, it is anticipated that the patient will be medically stable for discharge from the hospital within 2 midnights of admission.   Author: Jonah Blue, MD 07/22/2021 1:39 PM  For on call review [email or web address].

## 2021-07-22 NOTE — ED Provider Notes (Signed)
?[facility] ?Provider Note ? ? ?CSN: [id number] ?Arrival date & time: 07/22/21  0630 ? ?  ? ?History ? ?Chief Complaint  ?Patient presents with  ? Shortness of Breath  ? ? ?Melvin Mcclure is a 71 y.o. male. ? ? ?Shortness of Breath ? ?Patient with a medical history notable for CHF, type 2 diabetes, hyperlipidemia, hypertension, thoracentesis 4 months previous, 2 L of oxygen supplemental air as needed at baseline presenting today due to shortness of breath.  Shortness of breath started 3 to 4 days ago, he notices it more at night.  Denies any lower extremity swelling or chest pain.  While in triage patient dropped to 75%  while on room air.  ? ?Of note, patient is a candidate for renal transplant.  He has been undergoing recent work-up including close follow-up with cardiology and nephrology.  Stage IV kidney disease. ? ?Home Medications ?Prior to Admission medications   ?Medication Sig Start Date End Date Taking? Authorizing Provider  ?acetaminophen (TYLENOL) 500 MG tablet Take 500-1,000 mg by mouth every 6 (six) hours as needed for moderate pain or headache.   Yes [provider]  ?amLODipine (NORVASC) 10 MG tablet Take 10 mg by mouth daily.   Yes [provider]  ?aspirin 81 MG tablet Take 81 mg by mouth daily.   Yes [provider]  ?blood glucose meter kit and supplies KIT Dispense based on patient and insurance preference. Use up to four times daily as directed. (FOR ICD-9 250.00, 250.01). 04/21/20  Yes Hosie Poisson, MD  ?calcitRIOL (ROCALTROL) 0.25 MCG capsule Take 0.25 mcg by mouth every other day. 10/18/19  Yes [provider]  ?calcium acetate (PHOSLO) 667 MG capsule Take 1 capsule (667 mg total) by mouth 3 (three) times daily with meals. 04/21/20  Yes Hosie Poisson, MD  ?calcium carbonate (TUMS - DOSED IN MG ELEMENTAL CALCIUM) 500 MG chewable tablet Chew 2 tablets by mouth at bedtime.   Yes [provider]  ?carvedilol  (COREG) 12.5 MG tablet TAKE 1 TABLET BY MOUTH TWICE DAILY WITH A MEAL ?Patient taking differently: Take 12.5 mg by mouth 2 (two) times daily with a meal. 07/08/21  Yes Crenshaw, Denice Bors, MD  ?diclofenac Sodium (VOLTAREN) 1 % GEL Apply 2 g topically 4 (four) times daily. ?Patient taking differently: Apply 2 g topically 4 (four) times daily as needed (pain). 12/31/19  Yes Geradine Girt, DO  ?gabapentin (NEURONTIN) 300 MG capsule Take 1 capsule (300 mg total) by mouth 2 (two) times daily. 12/31/19  Yes Geradine Girt, DO  ?hydrALAZINE (APRESOLINE) 50 MG tablet Take 1 tablet (50 mg total) by mouth every 8 (eight) hours. ?Patient taking differently: Take 50 mg by mouth 3 (three) times daily. 06/04/18  Yes Dessa Phi, DO  ?HYDROcodone-acetaminophen (NORCO/VICODIN) 5-325 MG tablet Take 1 tablet by mouth every 4 (four) hours as needed for moderate pain. 03/21/21 03/21/22 Yes Setzer, Edman Circle, PA-C  ?hydrOXYzine (ATARAX/VISTARIL) 25 MG tablet Take 25 mg by mouth 2 (two) times daily as needed for anxiety. 04/13/20  Yes [provider]  ?Insulin Glargine (TOUJEO MAX SOLOSTAR [place]) Inject 20 Units into the skin every morning.   Yes [provider]  ?isosorbide mononitrate (IMDUR) 60 MG 24 hr tablet Take 1 tablet by mouth in the evening ?Patient taking differently: Take 60 mg by mouth every evening. 06/14/21  Yes Lelon Perla, MD  ?meclizine (ANTIVERT) 25 MG tablet Take 1 tablet (25 mg total) by mouth 3 (  three) times daily. ?Patient taking differently: Take 25 mg by mouth 2 (two) times daily. 12/31/19  Yes Geradine Girt, DO  ?metolazone (ZAROXOLYN) 5 MG tablet Take 5 mg by mouth every Sunday.   Yes [provider]  ?senna-docusate (SENOKOT-S) 8.6-50 MG tablet Take 1 tablet by mouth 2 (two) times daily as needed for mild constipation.    Yes [provider]  ?torsemide (DEMADEX) 20 MG tablet Take 40 mg by mouth 2 (two) times daily.   Yes [provider]  ?   ? ?Allergies     ?Patient has no known allergies.   ? ?Review of Systems   ?Review of Systems  ?Respiratory:  Positive for shortness of breath.   ? ?Physical Exam ?Updated Vital Signs ?BP 131/60   Pulse 65   Temp 98.6 ?F (37 ?C) (Oral)   Resp 14   SpO2 95%  ?Physical Exam ?Vitals and nursing note reviewed. Exam conducted with a chaperone present.  ?Constitutional:   ?   Appearance: Normal appearance.  ?   Comments: Chronically ill-appearing but not toxic  ?HENT:  ?   Head: Normocephalic and atraumatic.  ?Eyes:  ?   General: No scleral icterus.    ?   Right eye: No discharge.     ?   Left eye: No discharge.  ?   Extraocular Movements: Extraocular movements intact.  ?   Pupils: Pupils are equal, round, and reactive to light.  ?Cardiovascular:  ?   Rate and Rhythm: Normal rate and regular rhythm.  ?   Pulses: Normal pulses.  ?   Heart sounds: Normal heart sounds. No murmur heard. ?  No friction rub. No gallop.  ?Pulmonary:  ?   Effort: Pulmonary effort is normal. Tachypnea present. No respiratory distress.  ?   Breath sounds: Examination of the right-middle field reveals decreased breath sounds. Examination of the right-lower field reveals decreased breath sounds. Decreased breath sounds present.  ?   Comments: Mild tachypnea but no accessory muscle use.  Lung sounds are diminished to the right lower lobes ?Abdominal:  ?   General: Abdomen is flat. Bowel sounds are normal. There is no distension.  ?   Palpations: Abdomen is soft.  ?   Tenderness: There is no abdominal tenderness.  ?Skin: ?   General: Skin is warm and dry.  ?   Coloration: Skin is not jaundiced.  ?Neurological:  ?   Mental Status: He is alert. Mental status is at baseline.  ?   Coordination: Coordination normal.  ? ? ?ED Results / Procedures / Treatments   ?Labs ?(all labs ordered are listed, but only abnormal results are displayed) ?Labs Reviewed  ?BASIC METABOLIC PANEL - Abnormal; Notable for the following components:  ?    Result Value  ? Chloride 94 (*)   ?  Glucose, Bld 193 (*)   ? BUN 66 (*)   ? Creatinine, Ser 5.27 (*)   ? Calcium 8.6 (*)   ? GFR, Estimated 11 (*)   ? All other components within normal limits  ?CBC WITH DIFFERENTIAL/PLATELET - Abnormal; Notable for the following components:  ? Hemoglobin 12.3 (*)   ? HCT 38.9 (*)   ? RDW 16.2 (*)   ? All other components within normal limits  ?BRAIN NATRIURETIC PEPTIDE - Abnormal; Notable for the following components:  ? B Natriuretic Peptide 653.9 (*)   ? All other components within normal limits  ?RESP PANEL BY RT-PCR (FLU A&B, COVID) [id number]  ?TROPONIN  I (HIGH SENSITIVITY)  ?TROPONIN I (HIGH SENSITIVITY)  ? ? ?EKG ?EKG Interpretation ? ?Date/Time:  Monday July 22 2021 06:33:36 EDT ?Ventricular Rate:  71 ?PR Interval:  182 ?QRS Duration: 140 ?QT Interval:  468 ?QTC Calculation: 508 ?R Axis:   -56 ?Text Interpretation: Normal sinus rhythm Right bundle branch block Left anterior fascicular block ** Bifascicular block ** Left ventricular hypertrophy with repolarization abnormality ( R in aVL ) similar to 2021 Confirmed by Sherwood Gambler [phone]) on 07/22/2021 7:19:03 AM ? ?Radiology ?DG Chest Portable 1 View ? ?Result Date: 07/22/2021 ?CLINICAL DATA:  Shortness of breath in a 71 year old male. EXAM: PORTABLE CHEST 1 VIEW COMPARISON:  May 28, 2021. FINDINGS: EKG leads project over the chest. Trachea midline. Cardiomediastinal contours with signs of cardiac enlargement, at least moderate. Near complete opacification of the RIGHT mid and lower chest, obscured RIGHT hemidiaphragm. Minimal opacity at the LEFT lung base. No pneumothorax. On limited assessment there is no acute skeletal finding. IMPRESSION: 1. Signs of pleural fluid and RIGHT lower lobe consolidation with marked worsening since previous imaging from January of 2023. Findings could represent infection in the appropriate clinical setting. Given persistence of basilar consolidation and recurrent effusions contrasted CT of the chest on follow-up may be helpful  to exclude central pulmonary neoplasm. 2. Minimal opacity at the LEFT lung base likely atelectasis. 3. Cardiomegaly. Electronically Signed   By: Zetta Bills M.D.   On: 07/22/2021 08:23   ? ?Procedures ?Procedures  ?

## 2021-07-23 ENCOUNTER — Inpatient Hospital Stay (HOSPITAL_COMMUNITY): Payer: Medicare HMO

## 2021-07-23 ENCOUNTER — Observation Stay (HOSPITAL_COMMUNITY): Payer: Medicare HMO

## 2021-07-23 ENCOUNTER — Encounter (HOSPITAL_COMMUNITY)
Admission: EM | Disposition: A | Discharge status: Skilled Nursing Facility | Payer: Self-pay | Source: Home / Self Care | Attending: Internal Medicine

## 2021-07-23 DIAGNOSIS — B789 Strongyloidiasis, unspecified: Secondary | ICD-10-CM

## 2021-07-23 DIAGNOSIS — D631 Anemia in chronic kidney disease: Secondary | ICD-10-CM | POA: Diagnosis present

## 2021-07-23 DIAGNOSIS — I959 Hypotension, unspecified: Secondary | ICD-10-CM | POA: Diagnosis not present

## 2021-07-23 DIAGNOSIS — E1122 Type 2 diabetes mellitus with diabetic chronic kidney disease: Secondary | ICD-10-CM | POA: Diagnosis present

## 2021-07-23 DIAGNOSIS — J90 Pleural effusion, not elsewhere classified: Secondary | ICD-10-CM | POA: Diagnosis present

## 2021-07-23 DIAGNOSIS — E1100 Type 2 diabetes mellitus with hyperosmolarity without nonketotic hyperglycemic-hyperosmolar coma (NKHHC): Secondary | ICD-10-CM | POA: Diagnosis not present

## 2021-07-23 DIAGNOSIS — Z992 Dependence on renal dialysis: Secondary | ICD-10-CM | POA: Diagnosis not present

## 2021-07-23 DIAGNOSIS — Z7982 Long term (current) use of aspirin: Secondary | ICD-10-CM | POA: Diagnosis not present

## 2021-07-23 DIAGNOSIS — I38 Endocarditis, valve unspecified: Secondary | ICD-10-CM | POA: Diagnosis not present

## 2021-07-23 DIAGNOSIS — N186 End stage renal disease: Secondary | ICD-10-CM | POA: Diagnosis present

## 2021-07-23 DIAGNOSIS — J189 Pneumonia, unspecified organism: Secondary | ICD-10-CM | POA: Diagnosis not present

## 2021-07-23 DIAGNOSIS — I10 Essential (primary) hypertension: Secondary | ICD-10-CM

## 2021-07-23 DIAGNOSIS — I132 Hypertensive heart and chronic kidney disease with heart failure and with stage 5 chronic kidney disease, or end stage renal disease: Secondary | ICD-10-CM | POA: Diagnosis present

## 2021-07-23 DIAGNOSIS — D72820 Lymphocytosis (symptomatic): Secondary | ICD-10-CM | POA: Diagnosis present

## 2021-07-23 DIAGNOSIS — E871 Hypo-osmolality and hyponatremia: Secondary | ICD-10-CM | POA: Diagnosis not present

## 2021-07-23 DIAGNOSIS — E1165 Type 2 diabetes mellitus with hyperglycemia: Secondary | ICD-10-CM | POA: Diagnosis present

## 2021-07-23 DIAGNOSIS — I5032 Chronic diastolic (congestive) heart failure: Secondary | ICD-10-CM

## 2021-07-23 DIAGNOSIS — Z79899 Other long term (current) drug therapy: Secondary | ICD-10-CM | POA: Diagnosis not present

## 2021-07-23 DIAGNOSIS — I5033 Acute on chronic diastolic (congestive) heart failure: Secondary | ICD-10-CM | POA: Diagnosis present

## 2021-07-23 DIAGNOSIS — J9622 Acute and chronic respiratory failure with hypercapnia: Secondary | ICD-10-CM | POA: Diagnosis present

## 2021-07-23 DIAGNOSIS — N185 Chronic kidney disease, stage 5: Secondary | ICD-10-CM

## 2021-07-23 DIAGNOSIS — R0602 Shortness of breath: Secondary | ICD-10-CM | POA: Diagnosis present

## 2021-07-23 DIAGNOSIS — J9621 Acute and chronic respiratory failure with hypoxia: Secondary | ICD-10-CM | POA: Diagnosis present

## 2021-07-23 DIAGNOSIS — Z20822 Contact with and (suspected) exposure to covid-19: Secondary | ICD-10-CM | POA: Diagnosis present

## 2021-07-23 DIAGNOSIS — E1142 Type 2 diabetes mellitus with diabetic polyneuropathy: Secondary | ICD-10-CM | POA: Diagnosis present

## 2021-07-23 DIAGNOSIS — Z8249 Family history of ischemic heart disease and other diseases of the circulatory system: Secondary | ICD-10-CM | POA: Diagnosis not present

## 2021-07-23 DIAGNOSIS — Z794 Long term (current) use of insulin: Secondary | ICD-10-CM | POA: Diagnosis not present

## 2021-07-23 DIAGNOSIS — G9341 Metabolic encephalopathy: Secondary | ICD-10-CM | POA: Diagnosis present

## 2021-07-23 DIAGNOSIS — E785 Hyperlipidemia, unspecified: Secondary | ICD-10-CM | POA: Diagnosis present

## 2021-07-23 DIAGNOSIS — N2581 Secondary hyperparathyroidism of renal origin: Secondary | ICD-10-CM | POA: Diagnosis present

## 2021-07-23 LAB — GLUCOSE, CAPILLARY
Glucose-Capillary: 104 mg/dL — ABNORMAL HIGH (ref 70–99)
Glucose-Capillary: 104 mg/dL — ABNORMAL HIGH (ref 70–99)
Glucose-Capillary: 188 mg/dL — ABNORMAL HIGH (ref 70–99)
Glucose-Capillary: 231 mg/dL — ABNORMAL HIGH (ref 70–99)
Glucose-Capillary: 93 mg/dL (ref 70–99)

## 2021-07-23 LAB — PROTEIN, PLEURAL OR PERITONEAL FLUID: Total protein, fluid: <3 g/dL

## 2021-07-23 LAB — BASIC METABOLIC PANEL
Anion gap: 12 (ref 5–15)
BUN: 77 mg/dL — ABNORMAL HIGH (ref 8–23)
CO2: 31 mmol/L (ref 22–32)
Calcium: 8 mg/dL — ABNORMAL LOW (ref 8.9–10.3)
Chloride: 96 mmol/L — ABNORMAL LOW (ref 98–111)
Creatinine, Ser: 5.83 mg/dL — ABNORMAL HIGH (ref 0.61–1.24)
GFR, Estimated: 10 mL/min — ABNORMAL LOW (ref >60)
Glucose, Bld: 99 mg/dL (ref 70–99)
Potassium: 3.5 mmol/L (ref 3.5–5.1)
Sodium: 139 mmol/L (ref 135–145)

## 2021-07-23 LAB — BODY FLUID CELL COUNT WITH DIFFERENTIAL
Eos, Fluid: 0 %
Lymphs, Fluid: 82 %
Monocyte-Macrophage-Serous Fluid: 11 % — ABNORMAL LOW (ref 50–90)
Neutrophil Count, Fluid: 7 % (ref 0–25)
Total Nucleated Cell Count, Fluid: 1385 cu mm — ABNORMAL HIGH (ref 0–1000)

## 2021-07-23 LAB — ECHOCARDIOGRAM COMPLETE
Area-P 1/2: 3.72 cm2
Height: 71 in
S' Lateral: 3.1 cm
Weight: 3495.61 oz

## 2021-07-23 LAB — BLOOD GAS, ARTERIAL
Acid-Base Excess: 6.1 mmol/L — ABNORMAL HIGH (ref 0.0–2.0)
Acid-Base Excess: 7.8 mmol/L — ABNORMAL HIGH (ref 0.0–2.0)
Bicarbonate: 34.5 mmol/L — ABNORMAL HIGH (ref 20.0–28.0)
Bicarbonate: 35.3 mmol/L — ABNORMAL HIGH (ref 20.0–28.0)
Drawn by: 42783
O2 Saturation: 97.2 %
O2 Saturation: 96.4 %
Patient temperature: 37
Patient temperature: 37.3
pCO2 arterial: 67 mmHg (ref 32–48)
pCO2 arterial: 62 mmHg — ABNORMAL HIGH (ref 32–48)
pH, Arterial: 7.32 — ABNORMAL LOW (ref 7.35–7.45)
pH, Arterial: 7.37 (ref 7.35–7.45)
pO2, Arterial: 83 mmHg (ref 83–108)
pO2, Arterial: 80 mmHg — ABNORMAL LOW (ref 83–108)

## 2021-07-23 LAB — LACTATE DEHYDROGENASE, PLEURAL OR PERITONEAL FLUID: LD, Fluid: 79 U/L — ABNORMAL HIGH (ref 3–23)

## 2021-07-23 LAB — CBC
HCT: 35.3 % — ABNORMAL LOW (ref 39.0–52.0)
Hemoglobin: 11.2 g/dL — ABNORMAL LOW (ref 13.0–17.0)
MCH: 28.4 pg (ref 26.0–34.0)
MCHC: 31.7 g/dL (ref 30.0–36.0)
MCV: 89.6 fL (ref 80.0–100.0)
Platelets: 191 10*3/uL (ref 150–400)
RBC: 3.94 MIL/uL — ABNORMAL LOW (ref 4.22–5.81)
RDW: 16.2 % — ABNORMAL HIGH (ref 11.5–15.5)
WBC: 7.6 10*3/uL (ref 4.0–10.5)
nRBC: 0.3 % — ABNORMAL HIGH (ref 0.0–0.2)

## 2021-07-23 SURGERY — THORACENTESIS
Anesthesia: LOCAL

## 2021-07-23 NOTE — Progress Notes (Signed)
Repeat ABG critical co2 of 67. MD [place] notified. Orders to page critical care. Critical care paged. Pt not in respiratory distress. VS remain WNL. Awaiting further orders.  ?

## 2021-07-23 NOTE — TOC Progression Note (Signed)
Transition of Care (TOC) - Progression Note  ? ? ?Patient Details  ?Name: Melvin Mcclure ?MRN: [id number] ?Date of Birth: 05/25/1950 ? ?Transition of Care (TOC) CM/SW Contact  ?Zenon Mayo, RN ?Phone Number: ?07/23/2021, 2:19 PM ? ?Clinical Narrative:    ?from home with wife, HF, Pl effusion, for thoracentesis today, conts on iv lasix. TOC will continue to follow for dc needs.  ? ? ?  ?  ? ?Expected Discharge Plan and Services ?  ?  ?  ?  ?  ?                ?  ?  ?  ?  ?  ?  ?  ?  ?  ?  ? ? ?Social Determinants of Health (SDOH) Interventions ?  ? ?Readmission Risk Interventions ?Readmission Risk Prevention Plan 04/20/2020  ?Transportation Screening Complete  ?Medication Review Press photographer) Complete  ?PCP or Specialist appointment within 3-5 days of discharge Complete  ?[place] or Home Care Consult Complete  ?SW Recovery Care/Counseling Consult Complete  ?Palliative Care Screening Not Applicable  ?[place] Not Applicable  ?Some recent data might be hidden  ? ? ?

## 2021-07-23 NOTE — Progress Notes (Signed)
RN placed pt back on BiPAP. Per wife, they placed pt back on BiPAP due to cleaning pt up and decrease in SpO2. RT assessed pt and removed BiPAP and placed on 4L [place]. Pt tolerating well with SVS. RT will continue to monitor pt. ?

## 2021-07-23 NOTE — Procedures (Signed)
Thoracentesis Procedure Note ? ?Pre-operative Diagnosis: Right pleural effusion  ? ?Post-operative Diagnosis: Right pleural effusion, improved ? ?Indications: Diagnostic and therapeutic  ? ?Procedure Details  ?Consent: Informed consent was obtained. Risks of the procedure were discussed including: infection, bleeding, pain, pneumothorax. ? ?Under sterile conditions the patient was positioned. Betadine solution and sterile drapes were utilized.  1% plain lidocaine was used to anesthetize the rib space. Fluid was obtained without any difficulties and minimal blood loss.  A dressing was applied to the wound and wound care instructions were provided.  ? ?Findings ?1500 ml of serosanguinous pleural fluid was obtained. A sample was sent to Pathology for cytogenetics, flow, and cell counts, as well as for infection analysis. ? ?Complications:  None; patient tolerated the procedure well.   ?       ?Condition: stable ? ?Plan ?A follow up chest x-ray was ordered. ?Bed Rest for 3 hours. ?Tylenol 650 mg. for pain. ? ? ? ? ? ?  ?

## 2021-07-23 NOTE — Progress Notes (Addendum)
Serial chest CTs including from this admission reviewed with wife. ? ?Will do bronch w/ Tbbx tomorrow for nonresolving pneumonia to r/o smoldering infection which could affect renal transplant candidate. ? ?If he acts off tonight please check ABG ? ?Erskine Emery MD PCCM ?

## 2021-07-23 NOTE — TOC Progression Note (Signed)
Transition of Care (TOC) - Progression Note  ? ? ?Patient Details  ?Name: Melvin Mcclure ?MRN: [id number] ?Date of Birth: 12/06/1950 ? ?Transition of Care (TOC) CM/SW Contact  ?Abel Hageman Renold Don, LCSWA ?Phone Number: ?07/23/2021, 9:11 AM ? ?Clinical Narrative:    ? ?Transition of Care (TOC) Screening Note ? ? ?Patient Details  ?Name: Melvin Mcclure ?Date of Birth: May 31, 1950 ? ? ?Transition of Care (TOC) CM/SW Contact:    ?Reece Agar, LCSWA ?Phone Number: ?07/23/2021, 9:11 AM ? ? ? ?Transition of Care Department [facility]) has reviewed patient and no TOC needs have been identified at this time. We will continue to monitor patient advancement through interdisciplinary progression rounds. If new patient transition needs arise, please place a TOC consult. ?  ? ? ?  ?  ? ?Expected Discharge Plan and Services ?  ?  ?  ?  ?  ?                ?  ?  ?  ?  ?  ?  ?  ?  ?  ?  ? ? ?Social Determinants of Health (SDOH) Interventions ?  ? ?Readmission Risk Interventions ?Readmission Risk Prevention Plan 04/20/2020  ?Transportation Screening Complete  ?Medication Review Press photographer) Complete  ?PCP or Specialist appointment within 3-5 days of discharge Complete  ?[place] or Home Care Consult Complete  ?SW Recovery Care/Counseling Consult Complete  ?Palliative Care Screening Not Applicable  ?[place] Not Applicable  ?Some recent data might be hidden  ? ? ?

## 2021-07-23 NOTE — Progress Notes (Signed)
?PROGRESS NOTE ? ? ? ?Tracker Mance  TKW:[id number] DOB: [birth year] DOA: 07/22/2021 ?PCP: Nolene Ebbs, MD  ? ? ?Brief Narrative:  ?Melvin Mcclure is a 71 y.o. 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  Patient has been seen by nephrology as outpatient and has a son but has not been initiated on hemodialysis yet..  Patient had seen his primary care physician due to shortness of breath and was noted to be hypoxic.  Of note patient has had thoracocentesis 2 months prior to this admission.   He is being screened for renal transplant at [facility] but he is on hold for now due to strongyloidiasis (treated by ID), pulm issues (saw pulm and had thoracentesis), and elevated troponin (saw cards last week and he is scheduled for echo this week).  At home patient was wearing 2 L of oxygen as needed.  In the ED patient was noted to be dyspneic with pulse ox of 75% on room air.  Patient was then put on supplemental oxygen.  He was noted to have worsening renal function and nephrology was consulted.  Patient was then considered for admission to the hospital.  ? ?  ?Assessment and Plan: ?* Acute on chronic respiratory failure with hypoxia (HCC) ?Likely secondary to pleural effusion.  Patient with history of recurrent pleural effusion status post centesis in the past.  Presenting with acute hypoxia.  On as needed oxygen at home.  X-ray showing worsening right pleural effusion/consolidation.  Pulmonary nephrology on board.  ABG showed PCO2 of 67 this morning. ? ?Stage 5 chronic kidney disease ([place]) ?Progressive CKD stage V.  Seen by nephrology.  On Lasix 120 mg twice daily.  If no improvement might need hemodialysis. He has a functioning graft. ? ?Recurrent pleural effusion on right ?Pulmonary on board.   Plan for thoracocentesis again today. ? ?Chronic diastolic CHF (congestive heart failure) ([place]) ?Nephrology on board.  On IV Lasix.  Continue to monitor.  Continue aspirin Coreg ? ?Strongyloidiasis ?-Should have been effectively treated with Ivermectin x 2 rounds ?-F/u with ID and/or transplant as needed ? ?HTN (hypertension) ?-Continue amlodipine, hydralazine, carvedilol, as needed hydralazine. ? ?DM2 (diabetes mellitus, type 2) ([place]) ?Continue Toujeo, sliding scale insulin.  Hemoglobin A1c of 67. ? ? ? ? DVT prophylaxis: heparin injection 5,000 Units Start: 07/22/21 1400 ? ? ?Code Status:   ?  Code Status: Full Code ? ?Disposition: Home ? ?Status is: Observation ? ?The patient will require care spanning > 2 midnights and should be moved to inpatient because: Need for BiPAP, large pleural effusion needing thoracocentesis, ? ? Family Communication: Spoke with the patient's wife at bedside ? ?Consultants:  ?Pulmonary ? ?Procedures:  ?None yet ? ?Antimicrobials:  ?None ? ?Anti-infectives (From admission, onward)  ? ? None  ? ?  ? ? ?Subjective: ?Today, patient was seen and examined at bedside.  Patient feels frustrated that he has not eaten anything.  Complains of headache.  Wife at bedside reports that he feels a little agitated.  Was on BiPAP which was removed this morning.  Patient complains of breath and dyspnea. ? ?Objective: ?Vitals:  ? 07/23/21 0314 07/23/21 0744 07/23/21 0757 07/23/21 1038  ?BP: 140/67  (!) 150/75   ?Pulse: 74 80 81 74  ?Resp: '17 17 20 18  '$ ?Temp:   99.2 ?F (37.3 ?C)   ?TempSrc:      ?  SpO2: 97% 94% 90% 95%  ?Weight:      ?Height:      ? ? ?Intake/Output Summary (Last 24 hours) at 07/23/2021 1111 ?Last data filed at 07/23/2021 1011 ?Gross per 24 hour  ?Intake 542 ml  ?Output 700 ml  ?Net -158 ml  ? ?Filed Weights  ? 07/22/21 1824 07/23/21 0312  ?Weight: 98.2 kg 99.1 kg  ? ? ?Physical Examination: ? ?General:  Average built, not in obvious distress, feels irritated.  Nasal cannula oxygen. ?HENT:    No scleral pallor or icterus noted. Oral mucosa is moist.  ?Chest: Diminished breath sounds mostly on the right side.  Dullness on percussion on the right side. ?CVS: S1 &S2 heard. No murmur.  Regular rate and rhythm. ?Abdomen: Soft, nontender, nondistended.  Bowel sounds are heard.   ?Extremities: No cyanosis, clubbing with trace leg edema peripheral pulses are palpable. ?Psych: Alert, awake and oriented, feels frustrated. ?CNS:  No cranial nerve deficits.  Power equal in all extremities.   ?Skin: Warm and dry.  No rashes noted. ? ?Data Reviewed:  ? ?CBC: ?Recent Labs  ?Lab 07/22/21 ?0650 07/22/21 ?1408 07/23/21 ?0406  ?WBC 8.6  --  7.6  ?NEUTROABS 6.1  --   --   ?HGB 12.3* 12.2* 11.2*  ?HCT 38.9* 36.0* 35.3*  ?MCV 88.6  --  89.6  ?PLT 221  --  191  ? ? ?Basic Metabolic Panel: ?Recent Labs  ?Lab 07/22/21 ?0650 07/22/21 ?1408 07/23/21 ?0406  ?NA 139 138 139  ?K 3.5 3.8 3.5  ?CL 94*  --  96*  ?CO2 31  --  31  ?GLUCOSE 193*  --  99  ?BUN 66*  --  77*  ?CREATININE 5.27*  --  5.83*  ?CALCIUM 8.6*  --  8.0*  ? ? ?Liver Function Tests: ?No results for input(s): AST, ALT, ALKPHOS, BILITOT, PROT, ALBUMIN in the last 168 hours. ? ? ?Radiology Studies: ?DG Chest Portable 1 View ? ?Result Date: 07/22/2021 ?CLINICAL DATA:  Shortness of breath in a 71 year old male. EXAM: PORTABLE CHEST 1 VIEW COMPARISON:  May 28, 2021. FINDINGS: EKG leads project over the chest. Trachea midline. Cardiomediastinal contours with signs of cardiac enlargement, at least moderate. Near complete opacification of the RIGHT mid and lower chest, obscured RIGHT hemidiaphragm. Minimal opacity at the LEFT lung base. No pneumothorax. On limited assessment there is no acute skeletal finding. IMPRESSION: 1. Signs of pleural fluid and RIGHT lower lobe consolidation with marked worsening since previous imaging from January of 2023. Findings could represent infection in the appropriate clinical setting. Given persistence of basilar consolidation and recurrent  effusions contrasted CT of the chest on follow-up may be helpful to exclude central pulmonary neoplasm. 2. Minimal opacity at the LEFT lung base likely atelectasis. 3. Cardiomegaly. Electronically Signed   By: Zetta Bills M.D.   On: 07/22/2021 08:23   ? ? ? LOS: 0 days  ? ? ?Flora Lipps, MD ?[facility] ?07/23/2021, 11:11 AM  ? ? ?

## 2021-07-23 NOTE — Progress Notes (Signed)
Admit: 07/22/2021 ?LOS: 0 ? ?Melvin Mcclure is a/an 71 y.o. male with a past medical history notable for renal failure (transplant candidate; left brachiocephalic AV fistula created 16/07/37 ), diastolic CHF, HTN, T0GY who presents with acute hypoxic respiratory failure.    ? ?Subjective:  ?States he does not feel well. Wife at bedside and informs me overnight he was agitated with BiPAP. Recently removed this morning and doing a little better. Recognizes some improvement in abdominal distention.   ? ?03/20 0701 - 03/21 0700 ?In: 302 [P.O.:240; IV Piggyback:62] ?Out: 300 [Urine:300] ? ?Filed Weights  ? 07/22/21 1824 07/23/21 0312  ?Weight: 98.2 kg 99.1 kg  ? ? ?Scheduled Meds: ? amLODipine  10 mg Oral Daily  ? aspirin  81 mg Oral Daily  ? calcitRIOL  0.25 mcg Oral QODAY  ? calcium acetate  667 mg Oral TID WC  ? calcium carbonate  2 tablet Oral QHS  ? carvedilol  12.5 mg Oral BID WC  ? gabapentin  300 mg Oral BID  ? heparin  5,000 Units Subcutaneous Q8H  ? hydrALAZINE  50 mg Oral TID  ? insulin aspart  0-5 Units Subcutaneous QHS  ? insulin aspart  0-6 Units Subcutaneous TID WC  ? insulin glargine-yfgn  16 Units Subcutaneous Daily  ? isosorbide mononitrate  60 mg Oral QPM  ? meclizine  25 mg Oral BID  ? sodium chloride flush  3 mL Intravenous Q12H  ? ?Continuous Infusions: ? furosemide 120 mg (07/22/21 2103)  ? ?PRN Meds:.acetaminophen **OR** acetaminophen, albuterol, calcium carbonate (dosed in mg elemental calcium), docusate sodium, feeding supplement (NEPRO CARB STEADY), hydrALAZINE, HYDROcodone-acetaminophen, hydrOXYzine, ondansetron **OR** ondansetron (ZOFRAN) IV, senna-docusate, sorbitol, zolpidem ? ?Current Labs: reviewed pH 7.3, PCO2 67, K 3.5, Na 139, Cr 5.8, hgb 11, A1c 7.0, CBG 104 ? ? ?Physical Exam:  Blood pressure 140/67, pulse 74, temperature 98.6 ?F (37 ?C), temperature source Oral, resp. rate 17, height '5\' 11"'$  (1.803 m), weight 99.1 kg, SpO2 97 %. ?Physical Exam ?Constitutional:   ?   General: He is not  in acute distress. ?   Appearance: He is ill-appearing. He is not toxic-appearing or diaphoretic.  ?Cardiovascular:  ?   Rate and Rhythm: Normal rate and regular rhythm.  ?Pulmonary:  ?   Effort: Pulmonary effort is normal.  ?   Breath sounds: Normal breath sounds. No wheezing, rhonchi or rales.  ?Abdominal:  ?   Tenderness: There is no abdominal tenderness. There is no guarding.  ?   Comments: Taught and mild to moderate distention  ?Neurological:  ?   Mental Status: He is alert and oriented to person, place, and time.  ?Psychiatric:     ?   Mood and Affect: Mood normal.     ?   Behavior: Behavior normal.  ? ?A/P ?Doc Mandala is a/an 71 y.o. male with a past medical history notable for renal failure (transplant candidate; left brachiocephalic AV fistula created 69/48/54 ), diastolic CHF, HTN, O2VO.   ?  ?Acute hypoxic respiratory failure ?Hypervolemia 2/2 AoC CKD5, worsening ?H/o CHF  ?Respiratory status exacerbated by hypervolemia in the setting of worsening kidney function. CCM consulted yesterday due to increased hypercapnia. Started on BiPAP. S/p 120 mg IV lasix x2. VVS. Lung sounds with improved aeration on the right as compared to yesterday. UOP yesterday is minimal 0.3L and increased yet fairly unchanged creatinine. Will plan to continue to monitor kidney function post thoracentesis and determine the role of HD during this admission.  ?- Diuresis 120  mg IV lasix BID; assess UOP ?-Continue to monitor daily Cr, Dose meds for GFR<15 ?-Monitor Daily I/Os, Daily weight  ?-Maintain MAP>65 for optimal renal perfusion. Consider continuing to hold antihypertensive while npo if appropriate ?-Avoid further nephrotoxins including NSAIDS, Morphine.  Unless absolutely necessary, avoid CT with contrast and/or MRI with gadolinium.    ?-Consider starting HD if no improvement over the next 24 hours ?-Respiratory management per CCM; planning for thoracentesis today ? ?Hypertension, stable ?Normotensive. Home medications  held while NPO.  ?  ?Electrolytes, stable ?Unchanged from yesterday.  ?- AM BMP ?  ?Anemia due to chronic disease, stable ?12>11 ?-Transfuse for Hgb<7 g/dL ?- No IV iron ?-  No role for ESA in setting of AKI ?  ?T2DM ?A1c 7.0 ?-manage per primary team ?  ? ?Recent Labs  ?Lab 07/22/21 ?0650 07/22/21 ?1408 07/23/21 ?0406  ?NA 139 138 139  ?K 3.5 3.8 3.5  ?CL 94*  --  96*  ?CO2 31  --  31  ?GLUCOSE 193*  --  99  ?BUN 66*  --  77*  ?CREATININE 5.27*  --  5.83*  ?CALCIUM 8.6*  --  8.0*  ? ?Recent Labs  ?Lab 07/22/21 ?0650 07/22/21 ?1408 07/23/21 ?0406  ?WBC 8.6  --  7.6  ?NEUTROABS 6.1  --   --   ?HGB 12.3* 12.2* 11.2*  ?HCT 38.9* 36.0* 35.3*  ?MCV 88.6  --  89.6  ?PLT 221  --  191  ? ? ?Gerlene Fee, DO ?07/23/2021, 8:04 AM ?PGY-3, [place] ? ? ? ? ? ?

## 2021-07-23 NOTE — Progress Notes (Signed)
ABG critical co2 67. MD Rathore notified. Pt remains on Continuous BIPAP. ?

## 2021-07-23 NOTE — Progress Notes (Signed)
Inpatient Diabetes Program Recommendations ? ?AACE/ADA: New Consensus Statement on Inpatient Glycemic Control (2015) ? ?Target Ranges:  Prepandial:   less than 140 mg/dL ?     Peak postprandial:   less than 180 mg/dL (1-2 hours) ?     Critically ill patients:  140 - 180 mg/dL  ? ?Lab Results  ?Component Value Date  ? GLUCAP 188 (H) 07/23/2021  ? HGBA1C 7.0 (H) 07/22/2021  ? ? ? ?Diabetes history: Type 2 DM ?Outpatient Diabetes medications: Toujeo 20 units QD ?Current orders for Inpatient glycemic control: Toujeo 16 units QD, Novolog 0-6 units TID & HS ? ?Noted consult for uncontrolled diabetes.  A1C has resulted and is 7% reflecting an average blood glucose of 154 mg/dL.   ? ?Will continue to follow while inpatient. ? ?Thank you, ?Reche Dixon, MSN, RN ?Diabetes Coordinator ?Inpatient Diabetes Program ?[phone] (team pager from 8a-5p) ?  ? ? ? ? ?

## 2021-07-23 NOTE — Progress Notes (Signed)
?  Echocardiogram ?2D Echocardiogram has been performed. ? ?Melvin Mcclure ?07/23/2021, 4:37 PM ?

## 2021-07-23 NOTE — Hospital Course (Addendum)
71 y.o. 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  Apparently uses 2 L of oxygen by nasal cannula at home as needed.  Patient with history of recurrent pleural effusion.  Seen by pulmonology.  Had high oxygen requirements.  Was treated transferred to the ICU.  Underwent diagnostic and therapeutic thoracocentesis with removal of 1500 MLS of fluid on 07/23/2021.  Patient then underwent flexible bronchoscopy with brushing and bronchoalveolar lavage and transbronchial lung biopsy on 07/24/2021 for nonresolving opacity with recurrent pleural effusion. Required BiPAP.  Then transitioned to high flow nasal cannula.  BiPAP recommended at nighttime by pulmonology which the patient has been refusing.  Subsequently transferred back to floor.  Mentation has been gradually improving.  Remains deconditioned. ?

## 2021-07-23 NOTE — Plan of Care (Signed)
?  Problem: Education: ?Goal: Ability to verbalize understanding of medication therapies will improve ?Outcome: Progressing ?  ?Problem: Activity: ?Goal: Capacity to carry out activities will improve ?Outcome: Progressing ?  ?Problem: Cardiac: ?Goal: Ability to achieve and maintain adequate cardiopulmonary perfusion will improve ?Outcome: Progressing ?  ?

## 2021-07-23 NOTE — Progress Notes (Signed)
07/23/2021 ? ?I have seen and evaluated the patient for recurrent effusion. ? ?S:  ?Remains a bit off in mentation. ?Breathing stable. ?Family at bedside. ?Denies pain. ? ?O: ?Blood pressure (!) 111/58, pulse 73, temperature 99.2 ?F (37.3 ?C), resp. rate 20, height '5\' 11"'$  (1.803 m), weight 99.1 kg, SpO2 95 %.  ?No distress ?Lungs diminished lower 1/3 R chest ?Ext trace edema ?Moves all 4 ext to command ?Globally weak ? ?CT chest and CXR personally reviewed ? ?A: ?Recurrent R pleural effusion in patient with advanced CKD; question raised of underlying mass due to persistent atelectasis.  This is limiting his candidacy for transplant.  Think we just need to drain dry. ? ?P:  ?- Thora today, CT chest after ?- Send fluid for repeat cytology and flow cytometry given lymphocytosis in pleural fluid ?- Wife updated at bedside ?- Will follow and discuss CT findings tomorrow ? ?Erskine Emery MD ?[facility]monary Critical Care ?Prefer epic messenger for cross cover needs ?If after hours, please call E-link ? ?

## 2021-07-24 ENCOUNTER — Encounter (HOSPITAL_COMMUNITY): Payer: Self-pay | Admitting: Internal Medicine

## 2021-07-24 ENCOUNTER — Inpatient Hospital Stay (HOSPITAL_COMMUNITY): Payer: Medicare HMO | Admitting: Certified Registered"

## 2021-07-24 ENCOUNTER — Encounter (HOSPITAL_COMMUNITY)
Admission: EM | Disposition: A | Discharge status: Skilled Nursing Facility | Payer: Self-pay | Source: Home / Self Care | Attending: Internal Medicine

## 2021-07-24 ENCOUNTER — Inpatient Hospital Stay (HOSPITAL_COMMUNITY): Payer: Medicare HMO

## 2021-07-24 ENCOUNTER — Other Ambulatory Visit (HOSPITAL_BASED_OUTPATIENT_CLINIC_OR_DEPARTMENT_OTHER): Payer: Medicare HMO

## 2021-07-24 DIAGNOSIS — J189 Pneumonia, unspecified organism: Secondary | ICD-10-CM

## 2021-07-24 DIAGNOSIS — I5032 Chronic diastolic (congestive) heart failure: Secondary | ICD-10-CM

## 2021-07-24 DIAGNOSIS — E1122 Type 2 diabetes mellitus with diabetic chronic kidney disease: Secondary | ICD-10-CM

## 2021-07-24 DIAGNOSIS — I132 Hypertensive heart and chronic kidney disease with heart failure and with stage 5 chronic kidney disease, or end stage renal disease: Secondary | ICD-10-CM

## 2021-07-24 DIAGNOSIS — E1100 Type 2 diabetes mellitus with hyperosmolarity without nonketotic hyperglycemic-hyperosmolar coma (NKHHC): Secondary | ICD-10-CM | POA: Diagnosis not present

## 2021-07-24 DIAGNOSIS — N185 Chronic kidney disease, stage 5: Secondary | ICD-10-CM

## 2021-07-24 DIAGNOSIS — I10 Essential (primary) hypertension: Secondary | ICD-10-CM | POA: Diagnosis not present

## 2021-07-24 DIAGNOSIS — J9621 Acute and chronic respiratory failure with hypoxia: Secondary | ICD-10-CM | POA: Diagnosis not present

## 2021-07-24 HISTORY — PX: VIDEO BRONCHOSCOPY: SHX5072

## 2021-07-24 HISTORY — PX: BRONCHIAL BRUSHINGS: SHX5108

## 2021-07-24 HISTORY — PX: BRONCHIAL BIOPSY: SHX5109

## 2021-07-24 HISTORY — PX: BRONCHIAL WASHINGS: SHX5105

## 2021-07-24 LAB — BASIC METABOLIC PANEL
Anion gap: 14 (ref 5–15)
BUN: 89 mg/dL — ABNORMAL HIGH (ref 8–23)
CO2: 29 mmol/L (ref 22–32)
Calcium: 7.9 mg/dL — ABNORMAL LOW (ref 8.9–10.3)
Chloride: 93 mmol/L — ABNORMAL LOW (ref 98–111)
Creatinine, Ser: 6.16 mg/dL — ABNORMAL HIGH (ref 0.61–1.24)
GFR, Estimated: 9 mL/min — ABNORMAL LOW (ref >60)
Glucose, Bld: 91 mg/dL (ref 70–99)
Potassium: 3.6 mmol/L (ref 3.5–5.1)
Sodium: 136 mmol/L (ref 135–145)

## 2021-07-24 LAB — CBC
HCT: 34.9 % — ABNORMAL LOW (ref 39.0–52.0)
Hemoglobin: 11.2 g/dL — ABNORMAL LOW (ref 13.0–17.0)
MCH: 28 pg (ref 26.0–34.0)
MCHC: 32.1 g/dL (ref 30.0–36.0)
MCV: 87.3 fL (ref 80.0–100.0)
Platelets: 207 10*3/uL (ref 150–400)
RBC: 4 MIL/uL — ABNORMAL LOW (ref 4.22–5.81)
RDW: 15.7 % — ABNORMAL HIGH (ref 11.5–15.5)
WBC: 8.7 10*3/uL (ref 4.0–10.5)
nRBC: 0 % (ref 0.0–0.2)

## 2021-07-24 LAB — GLUCOSE, CAPILLARY
Glucose-Capillary: 91 mg/dL (ref 70–99)
Glucose-Capillary: 95 mg/dL (ref 70–99)
Glucose-Capillary: 93 mg/dL (ref 70–99)
Glucose-Capillary: 121 mg/dL — ABNORMAL HIGH (ref 70–99)
Glucose-Capillary: 181 mg/dL — ABNORMAL HIGH (ref 70–99)

## 2021-07-24 LAB — MAGNESIUM: Magnesium: 2.8 mg/dL — ABNORMAL HIGH (ref 1.7–2.4)

## 2021-07-24 LAB — CYTOLOGY - NON PAP

## 2021-07-24 LAB — MRSA NEXT GEN BY PCR, NASAL: MRSA by PCR Next Gen: NOT DETECTED

## 2021-07-24 SURGERY — BRONCHOSCOPY, WITH FLUOROSCOPY
Anesthesia: General | Laterality: Right

## 2021-07-24 MED ORDER — PROPOFOL 10 MG/ML IV BOLUS
INTRAVENOUS | Status: DC | PRN
  Administered 2021-07-24: 150 mg via INTRAVENOUS

## 2021-07-24 MED ORDER — FENTANYL CITRATE (PF) 250 MCG/5ML IJ SOLN
INTRAMUSCULAR | Status: DC | PRN
Start: 2021-07-24 — End: 2021-07-24
  Administered 2021-07-24: 100 ug via INTRAVENOUS

## 2021-07-24 MED ORDER — FENTANYL CITRATE (PF) 100 MCG/2ML IJ SOLN
INTRAMUSCULAR | Status: AC
  Filled 2021-07-24: qty 2

## 2021-07-24 MED ORDER — ONDANSETRON HCL 4 MG/2ML IJ SOLN
INTRAMUSCULAR | Status: DC | PRN
  Administered 2021-07-24: 4 mg via INTRAVENOUS

## 2021-07-24 MED ORDER — ACETAMINOPHEN 10 MG/ML IV SOLN
1000.0000 mg | Freq: Once | INTRAVENOUS | Status: DC | PRN
  Filled 2021-07-24: qty 100

## 2021-07-24 MED ORDER — ONDANSETRON HCL 4 MG/2ML IJ SOLN: 4.0000 mg | Freq: Once | INTRAMUSCULAR | Status: DC | PRN

## 2021-07-24 MED ORDER — SUGAMMADEX SODIUM 200 MG/2ML IV SOLN
INTRAVENOUS | Status: DC | PRN
Start: 2021-07-24 — End: 2021-07-24
  Administered 2021-07-24: 400 mg via INTRAVENOUS

## 2021-07-24 MED ORDER — ACETAMINOPHEN 10 MG/ML IV SOLN: 1000.0000 mg | Freq: Once | INTRAVENOUS | Status: DC | PRN

## 2021-07-24 MED ORDER — ROCURONIUM BROMIDE 10 MG/ML (PF) SYRINGE
PREFILLED_SYRINGE | INTRAVENOUS | Status: DC | PRN
Start: 2021-07-24 — End: 2021-07-24
  Administered 2021-07-24: 40 mg via INTRAVENOUS

## 2021-07-24 MED ORDER — PHENOL 1.4 % MT LIQD
1.0000 | OROMUCOSAL | Status: DC | PRN
  Administered 2021-07-24: 1 via OROMUCOSAL
  Filled 2021-07-24: qty 177

## 2021-07-24 MED ORDER — PHENYLEPHRINE HCL-NACL 20-0.9 MG/250ML-% IV SOLN
INTRAVENOUS | Status: DC | PRN
  Administered 2021-07-24: 30 ug/min via INTRAVENOUS

## 2021-07-24 MED ORDER — MEPERIDINE HCL 25 MG/ML IJ SOLN: 6.2500 mg | INTRAMUSCULAR | Status: DC | PRN

## 2021-07-24 MED ORDER — FENTANYL CITRATE (PF) 100 MCG/2ML IJ SOLN
25.0000 ug | INTRAMUSCULAR | Status: DC | PRN
  Administered 2021-07-24 (×3): 25 ug via INTRAVENOUS

## 2021-07-24 MED ORDER — ORAL CARE MOUTH RINSE
15.0000 mL | Freq: Two times a day (BID) | OROMUCOSAL | Status: DC
  Administered 2021-07-24 – 2021-08-12 (×32): 15 mL via OROMUCOSAL

## 2021-07-24 MED ORDER — DEXAMETHASONE SODIUM PHOSPHATE 10 MG/ML IJ SOLN
INTRAMUSCULAR | Status: DC | PRN
  Administered 2021-07-24: 10 mg via INTRAVENOUS

## 2021-07-24 MED ORDER — SODIUM CHLORIDE 0.9 % IV SOLN: INTRAVENOUS | Status: DC | PRN

## 2021-07-24 MED ORDER — CHLORHEXIDINE GLUCONATE CLOTH 2 % EX PADS
6.0000 | MEDICATED_PAD | Freq: Every day | CUTANEOUS | Status: DC
  Administered 2021-07-24 – 2021-08-04 (×12): 6 via TOPICAL

## 2021-07-24 MED ORDER — LIDOCAINE 2% (20 MG/ML) 5 ML SYRINGE
INTRAMUSCULAR | Status: DC | PRN
Start: 2021-07-24 — End: 2021-07-24
  Administered 2021-07-24: 100 mg via INTRAVENOUS

## 2021-07-24 MED ORDER — HYDROMORPHONE HCL 1 MG/ML IJ SOLN: 0.2500 mg | INTRAMUSCULAR | Status: DC | PRN

## 2021-07-24 MED ORDER — SUCCINYLCHOLINE CHLORIDE 200 MG/10ML IV SOSY
PREFILLED_SYRINGE | INTRAVENOUS | Status: DC | PRN
  Administered 2021-07-24: 140 mg via INTRAVENOUS

## 2021-07-24 NOTE — Transfer of Care (Signed)
Immediate Anesthesia Transfer of Care Note ? ?Patient: Melvin Mcclure ? ?Procedure(s) Performed: VIDEO BRONCHOSCOPY WITH FLUORO (Right) ?BRONCHIAL BRUSHINGS (Right) ?BRONCHIAL BIOPSIES ?BRONCHIAL WASHINGS ? ?Patient Location: PACU ? ?Anesthesia Type:General ? ?Level of Consciousness: Patient remains intubated per anesthesia plan ? ?Airway & Oxygen Therapy: Patient remains intubated per anesthesia plan and Patient placed on Ventilator (see vital sign flow sheet for setting) ? ?Post-op Assessment: Report given to RN and Post -op Vital signs reviewed and stable ? ?Post vital signs: Reviewed and stable ? ?Last Vitals:  ?Vitals Value Taken Time  ?BP 129/64 07/24/21 1042  ?Temp    ?Pulse 64 07/24/21 1051  ?Resp 21 07/24/21 1051  ?SpO2 100 % 07/24/21 1051  ?Vitals shown include unvalidated device data. ? ?Last Pain:  ?Vitals:  ? 07/24/21 0824  ?TempSrc: Temporal  ?PainSc: 0-No pain  ?   ? ?  ? ?Complications: No notable events documented. ?

## 2021-07-24 NOTE — Procedures (Signed)
Extubation Procedure Note ? ?Patient Details:   ?Name: Melvin Mcclure ?DOB: 1950-12-20 ?MRN: [id number] ?  ?Airway Documentation:  ?  ?Vent end date: 07/24/21 Vent end time: 7510  ? ?Evaluation ? O2 sats: stable throughout ?Complications: No apparent complications ?Patient did tolerate procedure well. ?Bilateral Breath Sounds: Diminished ?  ?Yes, pt could speak post extubation. Pt extubated to15 l/m HFNC per physician's order. ? ?Earney Navy ?07/24/2021, 1:07 PM ? ?

## 2021-07-24 NOTE — Progress Notes (Signed)
07/24/2021 ? ?I have seen and evaluated the patient for recurrent effusion and nonresolving pneumonia. ? ?S:  ?Seen in endoscopy ?Confusion improved. ?Wife at bedside. ?Denies breathlessness or pain. ?Continued mild O2 need. ? ?O: ?Blood pressure (!) 154/71, pulse 79, temperature 99.6 ?F (37.6 ?C), temperature source Oral, resp. rate 20, height '5\' 11"'$  (1.803 m), weight 96.8 kg, SpO2 95 %.  ?No distress ?Lungs diminished bases ?Ext warm ?Moves all 4 ext to command ?Aox3 but waxing/waning alertness levels ?Ext warm ? ?No new chest imaging ?Fluid remains transudative but with pleocytosis and lymphoid shift ? ?A: ?Recurrent R effusion and nonresolving bibasilar R>L infiltrates since 2020.  Preceding URI symptoms with some flares, question COP or smoldering infection. ?Hospital-acquired delirium perhaps exaggerated by high baseline uremia ?CKD undergoing transplant eval ? ?P:  ?- f/u repeat cyto and flow cytometry ?- bronch today with tbbx send for path and culture ?- I would hold on abx or steroids for now ?- Wife updated ?- Encourage IS ?- Will follow ? ?Erskine Emery MD ?[facility] Pulmonary Critical Care ?Prefer epic messenger for cross cover needs ?If after hours, please call E-link ? ?

## 2021-07-24 NOTE — Op Note (Signed)
Bronchoscopy Procedure Note ? ?Melvin Mcclure  ?[id number]  ?Sep 29, 1950 ? ?Date:07/24/21  ?Time:10:08 AM  ? ?Provider Performing:Jaycie Kregel C Tamala Julian  ? ?Procedure(s):  Flexible bronchoscopy with brushing (86578), Flexible bronchoscopy with bronchial alveolar lavage (46962), and Transbronchial lung biopsy, single lobe (95284) ? ?Indication(s) ?Nonresolving pneumonia ? ?Consent ?Risks of the procedure as well as the alternatives and risks of each were explained to the patient and/or caregiver.  Consent for the procedure was obtained and is signed in the bedside chart ? ?Anesthesia ?General ? ? ?Time Out ?Verified patient identification, verified procedure, site/side was marked, verified correct patient position, special equipment/implants available, medications/allergies/relevant history reviewed, required imaging and test results available. ? ? ?Sterile Technique ?Usual hand hygiene, masks, gowns, and gloves were used ? ? ?Procedure Description ?Bronchoscope advanced through endotracheal tube and into airway.  Airways were examined down to subsegmental level with findings noted below.   ?Following diagnostic evaluation,  ?- BAL/brushing/forceps biopsy done in RLL under fluoro guidance.  Fluoro time 30s. ? ?Findings:  ?- Dynamic airway collapse throughout, moderate ?- Small thick secretions ?- No endobronchial lesiosn ? ? ?Complications/Tolerance ?Slow to emerge from anesthesia, recommended to vent wean in ICU, called and left VM for wife ?Chest X-ray is needed post procedure. ? ? ?EBL ?Minimal ? ? ?Specimen(s) ?BAL RLL cyto/micro ?Brushing RLL cyto ?Forceps RLL path ? ?Erskine Emery MD PCCM ? ?

## 2021-07-24 NOTE — Progress Notes (Signed)
RT NOTES: Transported patient from PACU to 2M10 on vent without incident.  ?

## 2021-07-24 NOTE — Anesthesia Postprocedure Evaluation (Signed)
Anesthesia Post Note ? ?Patient: Melvin Mcclure ? ?Procedure(s) Performed: VIDEO BRONCHOSCOPY WITH FLUORO (Right) ?BRONCHIAL BRUSHINGS (Right) ?BRONCHIAL BIOPSIES ?BRONCHIAL WASHINGS ? ?  ? ?Patient location during evaluation: PACU ?Anesthesia Type: General ?Level of consciousness: lethargic, patient remains intubated per anesthesia plan and responds to stimulation ?Pain management: pain level controlled ?Vital Signs Assessment: post-procedure vital signs reviewed and stable ?Respiratory status: patient remains intubated per anesthesia plan and patient on ventilator - see flowsheet for VS ?Cardiovascular status: stable ?Postop Assessment: no apparent nausea or vomiting ?Anesthetic complications: no ?Comments: Patient remains intubated and will be transferred to the ICU for weaning because he has not recovered neuromuscular function sufficient enough to maintain his airway nor is he awake and alert enough to follow commands. This was explained to both him and his wife preoperatively. ? ? ?No notable events documented. ? ?Last Vitals:  ?Vitals:  ? 07/24/21 1115 07/24/21 1130  ?BP: 132/69 134/67  ?Pulse: 64 64  ?Resp: 18 18  ?Temp:    ?SpO2: 100% 100%  ?  ?Last Pain:  ?Vitals:  ? 07/24/21 0824  ?TempSrc: Temporal  ?PainSc: 0-No pain  ? ? ?  ?  ?  ?  ?  ?  ? ?Huston Foley ? ? ? ? ?

## 2021-07-24 NOTE — Progress Notes (Signed)
Admit: 07/22/2021 ?LOS: 1 ? ?Melvin Mcclure is a/an 71 y.o. male with a past medical history notable for renal failure (transplant candidate; left brachiocephalic AV fistula created 54/00/86 ), diastolic CHF, HTN, P6PP who presents with acute hypoxic respiratory failure.    ? ?Subjective:  ?Currently in PACU intubated ? ?03/21 0701 - 03/22 0700 ?In: 240 [P.O.:240] ?Out: 2050 [Urine:550] ? ?Filed Weights  ? 07/22/21 1824 07/23/21 0312 07/24/21 0400  ?Weight: 98.2 kg 99.1 kg 96.8 kg  ? ? ?Scheduled Meds: ? amLODipine  10 mg Oral Daily  ? aspirin  81 mg Oral Daily  ? calcitRIOL  0.25 mcg Oral QODAY  ? calcium acetate  667 mg Oral TID WC  ? calcium carbonate  2 tablet Oral QHS  ? carvedilol  12.5 mg Oral BID WC  ? gabapentin  300 mg Oral BID  ? heparin  5,000 Units Subcutaneous Q8H  ? hydrALAZINE  50 mg Oral TID  ? insulin aspart  0-5 Units Subcutaneous QHS  ? insulin aspart  0-6 Units Subcutaneous TID WC  ? insulin glargine-yfgn  16 Units Subcutaneous Daily  ? isosorbide mononitrate  60 mg Oral QPM  ? meclizine  25 mg Oral BID  ? sodium chloride flush  3 mL Intravenous Q12H  ? ?Continuous Infusions: ? furosemide 120 mg (07/23/21 1800)  ? ?PRN Meds:.acetaminophen **OR** acetaminophen, albuterol, calcium carbonate (dosed in mg elemental calcium), docusate sodium, feeding supplement (NEPRO CARB STEADY), hydrALAZINE, HYDROcodone-acetaminophen, hydrOXYzine, ondansetron **OR** ondansetron (ZOFRAN) IV, senna-docusate, sorbitol, zolpidem ? ?Current Labs: reviewed pH 7.3, PCO2 62, K 3.6, Na 136, Cr 5.8>6.16, hgb 11, CBG 91 ? ? ?Physical Exam:  Blood pressure (!) 154/71, pulse 79, temperature 99.6 ?F (37.6 ?C), temperature source Oral, resp. rate 20, height '5\' 11"'$  (1.803 m), weight 96.8 kg, SpO2 95 %. ?Physical Exam ? ?A/P ?Melvin Mcclure is a/an 71 y.o. male with a past medical history notable for renal failure (transplant candidate; left brachiocephalic AV fistula created 50/93/26 ), diastolic CHF, HTN, Z1IW.   ?  ?Acute  hypoxic respiratory failure ?Hypervolemia 2/2 AoC CKD5, worsening ?H/o CHF  ?Respiratory status exacerbated by hypervolemia in the setting of worsening kidney function. CCM consulted yesterday due to increased hypercapnia. Started on BiPAP. S/p 120 mg IV lasix BID.  S/p 1.5L off from thoracentesis yesterday. VVS. Coarse breath sounds with mechanical ventilation. UOP continues to be minimal 0.5L and steadily increasing creatinine. Consider timing for starting HD. Of note, currently in PACU intubated and plan to transfer to ICU to wean mechanical ventilation.  ?- Diuresis 120 mg IV lasix BID; assess UOP ?-Continue to monitor daily Cr, Dose meds for GFR<15 ?-Monitor Daily I/Os, Daily weight  ?-Maintain MAP>65 for optimal renal perfusion. Consider continuing to hold antihypertensive while npo if appropriate ?-Avoid further nephrotoxins including NSAIDS, Morphine.  Unless absolutely necessary, avoid CT with contrast and/or MRI with gadolinium.    ?-Consider starting HD ?-Respiratory management per CCM; planning to wean vent ? ?Hypertension, stable ?Normotensive. Home medications held while NPO.  ?  ?Electrolytes, stable ?Unchanged from yesterday.  ?- AM BMP ?  ?Anemia due to chronic disease, stable ?-Transfuse for Hgb<7 g/dL ?  ?T2DM ?-manage per primary team ?  ? ?Recent Labs  ?Lab 07/22/21 ?0650 07/22/21 ?1408 07/23/21 ?0406 07/24/21 ?0353  ?NA 139 138 139 136  ?K 3.5 3.8 3.5 3.6  ?CL 94*  --  96* 93*  ?CO2 31  --  31 29  ?GLUCOSE 193*  --  99 91  ?BUN 66*  --  77* 89*  ?CREATININE 5.27*  --  5.83* 6.16*  ?CALCIUM 8.6*  --  8.0* 7.9*  ? ? ?Recent Labs  ?Lab 07/22/21 ?0650 07/22/21 ?1408 07/23/21 ?0406 07/24/21 ?0353  ?WBC 8.6  --  7.6 8.7  ?NEUTROABS 6.1  --   --   --   ?HGB 12.3* 12.2* 11.2* 11.2*  ?HCT 38.9* 36.0* 35.3* 34.9*  ?MCV 88.6  --  89.6 87.3  ?PLT 221  --  191 207  ? ? ?Gerlene Fee, DO ?07/23/2021, 8:04 AM ?PGY-3, [place] ? ? ? ? ? ?

## 2021-07-24 NOTE — Progress Notes (Signed)
RT NOTES: Withdrew ETT from 27cm at lips to 24cm at lips per orders. ?

## 2021-07-24 NOTE — Progress Notes (Addendum)
?PROGRESS NOTE ? ? ? ?Melvin Mcclure  XTG:[id number] DOB: 1950-10-31 DOA: 07/22/2021 ?PCP: Nolene Ebbs, MD  ? ? ?Brief Narrative:  ?Melvin Mcclure is a 71 y.o. 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  Patient has been seen by nephrology as outpatient and has a son but has not been initiated on hemodialysis yet..  Patient had seen his primary care physician due to shortness of breath and was noted to be hypoxic.  Of note, patient has had thoracocentesis 2 months prior to this admission.   He is being screened for renal transplant at [facility] but he is on hold for now due to strongyloidiasis (treated by ID), pulm issues (saw pulm and had thoracentesis), and elevated troponin (saw cards last week and he is scheduled for echo this week).  At home patient was wearing 2 L of oxygen as needed.  In the ED ,patient was noted to be dyspneic with pulse ox of 75% on room air.  Patient was then put on supplemental oxygen.  He was noted to have worsening renal function and nephrology was consulted.  Patient was then considered for admission to the hospital.  During hospitalization, patient was seen by pulmonary and diagnostic and therapeutic thoracocentesis with removal of 1500 MLS of fluid on 07/23/2021.  Patient then underwent flexible bronchoscopy with brushing and bronchoalveolar lavage and transbronchial lung biopsy on 07/24/2021 for nonresolving opacity with recurrent pleural effusion.  ? ?  ?Assessment and Plan: ?* Acute on chronic respiratory failure with hypoxia (HCC) ?Likely secondary to pleural effusion.  Patient with history of recurrent pleural effusion status post Thoracocentesis in the past.  X-ray on this admission showing worsening right pleural effusion/consolidation.  Pulmonary and  nephrology on board.  ABG showed PCO2 of 67 on 07/23/2021.  Status post thoracocentesis on  07/23/2021 with removal of 1500 mL of pleural fluid.  Pleural fluid culture negative in 24 hours.  Additionally, patient underwent flexible bronchoscopy with brushing and bronchoalveolar lavage and transbronchial lung biopsy on 07/24/2021  for possible nonresolving pneumonia and infusion to rule out a smoldering infection which could affect renal transplant.  We will follow biopsy/BAL report.  Follow pulmonary recommendations ? ?Stage 5 chronic kidney disease ([place]) ?Progressive CKD stage V.  Nephrology on board.  On Lasix 120 mg twice daily.  Monitor for diuresis despite high-dose Lasix.  Patient might need hemodialysis ultimately. ? ?Recurrent pleural effusion on right ?Pulmonary on board.  Status post thoracocentesis 07/23/2021,flexible bronchoscopy with brushing and bronchoalveolar lavage and transbronchial lung biopsy on 07/24/2021 will follow recommendations. ? ?Chronic diastolic CHF (congestive heart failure) ([place]) ?Nephrology on board.  On IV Lasix.  Continue to monitor.  Continue aspirin Coreg, hydralazine, amlodipine.  2D echocardiogram done 07/23/2021 shows preserved LV function with LV ejection fraction of 55 to 60% with no regional wall motion abnormality and grade 2 diastolic dysfunction. ? ?Strongyloidiasis ?-Should have been effectively treated with Ivermectin x 2 rounds ?-F/u with ID and/or transplant as needed ? ?HTN (hypertension) ?-Continue amlodipine, hydralazine, carvedilol, as needed hydralazine. ? ?DM2 (diabetes mellitus, type 2) ([place]) ?Continue [place]jeo, sliding scale insulin.  Hemoglobin A1c of 67.  Latest POC glucose of 93 ? ? ? ? DVT prophylaxis: heparin injection 5,000 Units Start: 07/22/21 1400 ? ? ?Code Status:   ?  Code Status: Full Code ? ?Disposition: Home ? ?Status is: Inpatient ? ?The patient  is inpatient because:  large pleural effusion status post thoracocentesis, status post bronchoscopy with  bronchoalveolar lavage, advanced kidney disease with possible need for dialysis ? ? Family Communication:  ? ?I again spoke with the patient's wife at bedside today and updated her about the clinical condition of the patient. ? ?Consultants:  ?Pulmonary ?Nephrology ? ?Procedures:  ?Thoracocentesis 07/23/2021 ?flexible bronchoscopy with brushing and bronchoalveolar lavage and transbronchial lung biopsy on 07/24/2021 ? ?Antimicrobials:  ?None ? ?Anti-infectives (From admission, onward)  ? ? None  ? ?  ? ?Subjective: ?Today, patient was seen and examined at bedside.  Seen after bronchoscopy.  Patient had to be briefly mechanically ventilated and now has been extubated, patient feels slightly sleepy but still able to communicate.  Denies any pain nausea vomiting.  Patient's wife at bedside. ? ?Objective: ?Vitals:  ? 07/24/21 1145 07/24/21 1220 07/24/21 1251 07/24/21 1300  ?BP: 135/67 (!) 136/94  134/67  ?Pulse: 65  76 74  ?Resp: 18 (!) _0 ?Temp: 99.1 ?F (37.3 ?C) 98.9 ?F (37.2 ?C)    ?TempSrc:  Oral    ?SpO2: 100% 99% 91% 94%  ?Weight:  97.6 kg    ?Height:  _1  (1.778 m)    ? ? ?Intake/Output Summary (Last 24 hours) at 07/24/2021 1423 ?Last data filed at 07/24/2021 0700 ?Gross per 24 hour  ?Intake 0 ml  ?Output 150 ml  ?Net -150 ml  ? ?Filed Weights  ? 07/23/21 0312 07/24/21 0400 07/24/21 1220  ?Weight: 99.1 kg 96.8 kg 97.6 kg  ? ? ?Physical Examination: ? ?General:  Average built, not in obvious distress, on high flow nasal cannula 15 L/min.  Somnolent. ?HENT:   No scleral pallor or icterus noted. Oral mucosa is moist.  ?Chest: Diminished breath sounds, mostly on the right side.Marland Kitchen  ?CVS: S1 &S2 heard. No murmur.  Regular rate and rhythm. ?Abdomen: Soft, nontender, nondistended.  Bowel sounds are heard.   ?Extremities: No cyanosis, clubbing trace peripheral edema, peripheral pulses are palpable. ?Psych: Somnolent but able to communicate and verbalize. ?CNS:  No cranial nerve deficits.  Moves all extremities. ?Skin:  Warm and dry.  No rashes noted. ? ?Data Reviewed:  ? ?CBC: ?Recent Labs  ?Lab 07/22/21 ?0650 07/22/21 ?1408 07/23/21 ?0406 07/24/21 ?0353  ?WBC 8.6  --  7.6 8.7  ?NEUTROABS 6.1  --   --   --   ?HGB 12.3* 12.2* 11.2* 11.2*  ?HCT 38.9* 36.0* 35.3* 34.9*  ?MCV 88.6  --  89.6 87.3  ?PLT 221  --  191 207  ? ? ?Basic Metabolic Panel: ?Recent Labs  ?Lab 07/22/21 ?0650 07/22/21 ?1408 07/23/21 ?0406 07/24/21 ?0353  ?NA 139 138 139 136  ?K 3.5 3.8 3.5 3.6  ?CL 94*  --  96* 93*  ?CO2 31  --  31 29  ?GLUCOSE 193*  --  99 91  ?BUN 66*  --  77* 89*  ?CREATININE 5.27*  --  5.83* 6.16*  ?CALCIUM 8.6*  --  8.0* 7.9*  ?MG  --   --   --  2.8*  ? ? ?Liver Function Tests: ?No results for input(s): AST, ALT, ALKPHOS, BILITOT, PROT, ALBUMIN in the last 168 hours. ? ? ?Radiology Studies: ?CT CHEST WO CONTRAST ? ?Result Date: 07/23/2021 ?CLINICAL DATA:  Chronic dyspnea. EXAM: CT CHEST WITHOUT CONTRAST TECHNIQUE: Multidetector CT imaging of the chest was performed following the standard protocol without IV contrast. RADIATION DOSE REDUCTION: This exam was performed according to the departmental dose-optimization program which  includes automated exposure control, adjustment of the mA and/or kV according to patient size and/or use of iterative reconstruction technique. COMPARISON:  May 30, 2020. FINDINGS: Cardiovascular: Atherosclerosis of thoracic aorta is noted without aneurysm formation. Mild cardiomegaly is noted. No pericardial effusion is noted. Coronary artery calcifications are again noted. Mediastinum/Nodes: No enlarged mediastinal or axillary lymph nodes. Thyroid gland, trachea, and esophagus demonstrate no significant findings. Lungs/Pleura: No pneumothorax is noted. Airspace opacities are noted in both lower lobes as well as the right middle lobe most consistent with multifocal pneumonia. Small right pleural effusion is noted. Upper Abdomen: No acute abnormality. Musculoskeletal: No chest wall mass or suspicious bone lesions  identified. IMPRESSION: Large airspace opacities are noted in both lower lobes and right middle lobe consistent with pneumonia. Small right pleural effusion is noted as well. Coronary artery calcifications ar

## 2021-07-24 NOTE — Anesthesia Procedure Notes (Addendum)
Procedure Name: Intubation ?Date/Time: 07/24/2021 9:33 AM ?Performed by: Griffin Dakin, CRNA ?Pre-anesthesia Checklist: Patient identified, Emergency Drugs available, Suction available and Patient being monitored ?Patient Re-evaluated:Patient Re-evaluated prior to induction ?Oxygen Delivery Method: Circle system utilized ?Preoxygenation: Pre-oxygenation with 100% oxygen ?Induction Type: IV induction ?Ventilation: Mask ventilation without difficulty and Oral airway inserted - appropriate to patient size ?Laryngoscope Size: Glidescope and 4 ?Grade View: Grade II ?Tube type: Oral ?Tube size: 8.5 mm ?Number of attempts: 1 ?Airway Equipment and Method: Rigid stylet and Video-laryngoscopy ?Placement Confirmation: ETT inserted through vocal cords under direct vision, positive ETCO2 and breath sounds checked- equal and bilateral ?Secured at: 25 cm ?Tube secured with: Tape ?Dental Injury: Teeth and Oropharynx as per pre-operative assessment  ? ? ? ? ?

## 2021-07-24 NOTE — Anesthesia Preprocedure Evaluation (Addendum)
Anesthesia Evaluation  ?Patient identified by MRN, date of birth, ID band ?Patient awake ? ? ? ?Reviewed: ?Allergy & Precautions, NPO status , Patient's Chart, lab work & pertinent test results, reviewed documented beta blocker date and time  ? ?Airway ?Mallampati: III ? ?TM Distance: >3 FB ?Neck ROM: Full ? ?Mouth opening: Limited Mouth Opening ? Dental ? ?(+) Missing, Dental Advisory Given, Poor Dentition, Caps ?  ?Pulmonary ?pneumonia, unresolved, former smoker,  ?  ?Pulmonary exam normal ?breath sounds clear to auscultation ? ? ? ? ? ? Cardiovascular ?hypertension, Pt. on medications and Pt. on home beta blockers ?+CHF  ?Normal cardiovascular exam ?Rhythm:Regular Rate:Normal ? ?TTE 2021 ?1. Left ventricular ejection fraction, by estimation, is 55 to 60%. The  ?left ventricle has normal function. The left ventricle has no regional  ?wall motion abnormalities. There is moderate left ventricular hypertrophy.  ?Left ventricular diastolic  ?parameters are indeterminate.  ??2. Right ventricular systolic function is normal. The right ventricular  ?size is normal. Tricuspid regurgitation signal is inadequate for assessing  ?PA pressure.  ??3. The mitral valve is grossly normal. Mild mitral valve regurgitation.  ??4. The aortic valve is tricuspid, mildly calcified. Aortic valve  ?regurgitation is not visualized.  ??5. The inferior vena cava is normal in size with greater than 50%  ?respiratory variability, suggesting right atrial pressure of 3 mmHg.  ??6. Trivial to small circumferential pericardial effusion. ? ?Stress Test 2020 ?The left ventricular ejection fraction is normal (55-65%). ?Nuclear stress EF: 57%. No wall motion abnormalities ?There was no ST segment deviation noted during stress. ?Defect 1: There is a medium defect of mild severity present in the basal inferior location. This appears secondary to diaphragmatic attenuation artifact ?This is a low risk study. No significant  evidence of ischemia identified. ? ?  ?Neuro/Psych ?PSYCHIATRIC DISORDERS Anxiety negative neurological ROS ?   ? GI/Hepatic ?negative GI ROS, Neg liver ROS,   ?Endo/Other  ?diabetes, Type 2, Oral Hypoglycemic Agents, Insulin Dependent ? Renal/GU ?ESRFRenal diseaseNot currently on dialysis  ?negative genitourinary ?  ?Musculoskeletal ? ?(+) Arthritis ,  ? Abdominal ?Normal abdominal exam  (+)   ?Peds ? Hematology ? ?(+) Blood dyscrasia, anemia ,   ?Anesthesia Other Findings ? ? Reproductive/Obstetrics ? ?  ? ? ? ? ? ? ? ? ? ? ? ? ? ?  ?  ? ? ? ? ? ? ? ?Anesthesia Physical ? ?Anesthesia Plan ? ?ASA: 3 ? ?Anesthesia Plan: General  ? ?Post-op Pain Management:  Regional for Post-op pain and Dilaudid IV  ? ?Induction: Intravenous ? ?PONV Risk Score and Plan: Propofol infusion, Treatment may vary due to age or medical condition, Ondansetron and Dexamethasone ? ?Airway Management Planned: Oral ETT ? ?Additional Equipment: None ? ?Intra-op Plan:  ? ?Post-operative Plan: Extubation in OR and Possible Post-op intubation/ventilation ? ?Informed Consent: I have reviewed the patients History and Physical, chart, labs and discussed the procedure including the risks, benefits and alternatives for the proposed anesthesia with the patient or authorized representative who has indicated his/her understanding and acceptance.  ? ? ? ?Dental advisory given ? ?Plan Discussed with: CRNA ? ?Anesthesia Plan Comments: ( ?)  ? ? ? ? ? ?Anesthesia Quick Evaluation ? ?

## 2021-07-25 ENCOUNTER — Inpatient Hospital Stay (HOSPITAL_COMMUNITY): Payer: Medicare HMO

## 2021-07-25 DIAGNOSIS — J9621 Acute and chronic respiratory failure with hypoxia: Secondary | ICD-10-CM | POA: Diagnosis not present

## 2021-07-25 LAB — CBC
HCT: 33.6 % — ABNORMAL LOW (ref 39.0–52.0)
Hemoglobin: 10.6 g/dL — ABNORMAL LOW (ref 13.0–17.0)
MCH: 27.7 pg (ref 26.0–34.0)
MCHC: 31.5 g/dL (ref 30.0–36.0)
MCV: 88 fL (ref 80.0–100.0)
Platelets: 201 10*3/uL (ref 150–400)
RBC: 3.82 MIL/uL — ABNORMAL LOW (ref 4.22–5.81)
RDW: 15.7 % — ABNORMAL HIGH (ref 11.5–15.5)
WBC: 8.3 10*3/uL (ref 4.0–10.5)
nRBC: 0 % (ref 0.0–0.2)

## 2021-07-25 LAB — POCT I-STAT 7, (LYTES, BLD GAS, ICA,H+H)
Acid-Base Excess: 1 mmol/L (ref 0.0–2.0)
Acid-Base Excess: 2 mmol/L (ref 0.0–2.0)
Bicarbonate: 28.7 mmol/L — ABNORMAL HIGH (ref 20.0–28.0)
Bicarbonate: 29.3 mmol/L — ABNORMAL HIGH (ref 20.0–28.0)
Calcium, Ion: 0.96 mmol/L — ABNORMAL LOW (ref 1.15–1.40)
Calcium, Ion: 0.92 mmol/L — ABNORMAL LOW (ref 1.15–1.40)
HCT: 36 % — ABNORMAL LOW (ref 39.0–52.0)
HCT: 39 % (ref 39.0–52.0)
Hemoglobin: 12.2 g/dL — ABNORMAL LOW (ref 13.0–17.0)
Hemoglobin: 13.3 g/dL (ref 13.0–17.0)
O2 Saturation: 86 %
O2 Saturation: 98 %
Patient temperature: 97.9
Potassium: 4.8 mmol/L (ref 3.5–5.1)
Potassium: 4.5 mmol/L (ref 3.5–5.1)
Sodium: 133 mmol/L — ABNORMAL LOW (ref 135–145)
Sodium: 135 mmol/L (ref 135–145)
TCO2: 31 mmol/L (ref 22–32)
TCO2: 31 mmol/L (ref 22–32)
pCO2 arterial: 61.6 mmHg — ABNORMAL HIGH (ref 32–48)
pCO2 arterial: 56.6 mmHg — ABNORMAL HIGH (ref 32–48)
pH, Arterial: 7.277 — ABNORMAL LOW (ref 7.35–7.45)
pH, Arterial: 7.32 — ABNORMAL LOW (ref 7.35–7.45)
pO2, Arterial: 59 mmHg — ABNORMAL LOW (ref 83–108)
pO2, Arterial: 111 mmHg — ABNORMAL HIGH (ref 83–108)

## 2021-07-25 LAB — RENAL FUNCTION PANEL
Albumin: 3 g/dL — ABNORMAL LOW (ref 3.5–5.0)
Anion gap: 17 — ABNORMAL HIGH (ref 5–15)
BUN: 118 mg/dL — ABNORMAL HIGH (ref 8–23)
CO2: 26 mmol/L (ref 22–32)
Calcium: 7.4 mg/dL — ABNORMAL LOW (ref 8.9–10.3)
Chloride: 91 mmol/L — ABNORMAL LOW (ref 98–111)
Creatinine, Ser: 7.93 mg/dL — ABNORMAL HIGH (ref 0.61–1.24)
GFR, Estimated: 7 mL/min — ABNORMAL LOW (ref >60)
Glucose, Bld: 230 mg/dL — ABNORMAL HIGH (ref 70–99)
Phosphorus: 10.7 mg/dL — ABNORMAL HIGH (ref 2.5–4.6)
Potassium: 4.6 mmol/L (ref 3.5–5.1)
Sodium: 134 mmol/L — ABNORMAL LOW (ref 135–145)

## 2021-07-25 LAB — ACID FAST SMEAR (AFB, MYCOBACTERIA): Acid Fast Smear: NEGATIVE

## 2021-07-25 LAB — FLOW CYTOMETRY REQUEST - FLUID (INPATIENT)

## 2021-07-25 LAB — SURGICAL PATHOLOGY

## 2021-07-25 LAB — GLUCOSE, CAPILLARY
Glucose-Capillary: 203 mg/dL — ABNORMAL HIGH (ref 70–99)
Glucose-Capillary: 224 mg/dL — ABNORMAL HIGH (ref 70–99)
Glucose-Capillary: 180 mg/dL — ABNORMAL HIGH (ref 70–99)
Glucose-Capillary: 143 mg/dL — ABNORMAL HIGH (ref 70–99)
Glucose-Capillary: 214 mg/dL — ABNORMAL HIGH (ref 70–99)

## 2021-07-25 LAB — HEPATITIS B CORE ANTIBODY, TOTAL: Hep B Core Total Ab: NONREACTIVE

## 2021-07-25 LAB — CYTOLOGY - NON PAP

## 2021-07-25 LAB — HEPATITIS B SURFACE ANTIGEN: Hepatitis B Surface Ag: NONREACTIVE

## 2021-07-25 MED ORDER — CARVEDILOL 12.5 MG PO TABS: 6.2500 mg | ORAL_TABLET | Freq: Two times a day (BID) | ORAL | Status: DC

## 2021-07-25 MED ORDER — CHLORHEXIDINE GLUCONATE CLOTH 2 % EX PADS: 6.0000 | MEDICATED_PAD | Freq: Every day | CUTANEOUS | Status: DC

## 2021-07-25 NOTE — Progress Notes (Signed)
Spoke with Dr. Ina Homes, PCCM  At this time patient has been transitioned to BiPAP.  Plan is to proceed on hemodialysis soon.  Patient will be transition to ICU service.  We will sign off at this time. ?

## 2021-07-25 NOTE — Progress Notes (Signed)
ABG's performed earlier in shift. Patient remained on [place] till this point and has been placed back on Bipap for bed.  ?

## 2021-07-25 NOTE — Progress Notes (Signed)
? ?NAME:  Melvin Mcclure, MRN:  [id number], DOB:  [birth year], LOS: 2 ?ADMISSION DATE:  07/22/2021, CONSULTATION DATE:  07/22/21 ?REFERRING MD:  Dr. Lorin Mercy, CHIEF COMPLAINT:  Recurrent pleural effusion ? ?History of Present Illness:  ?71 year old gentleman with past medical history of CKD 5, HFpEF, type 2 diabetes, strongyloidiasis infection status post ivermectin treatment, who presents to the hospital for worsening shortness of breath 3-4 days ago.  He is on 2 L of oxygen at home.  He denies coughing, sputum production or wheezing.  Denies fever, night sweat or weight loss.  Reports compliance to diuretics but urine output has been decreased.  He quit smoking in 1960s. ? ?Patient have history of recurrent right pleural effusion.  Has had thoracentesis in 12/2019, 04/2020 and most recently 05/24/2021.  Lights criteria for all of them were transudative. ? ?Pertinent  Medical History  ? ?Past Medical History:  ?Diagnosis Date  ? Anemia in chronic kidney disease (CKD)   ? stage 5  ? Anxiety   ? Arthritis   ? CHF (congestive heart failure) ([place]) 05/28/2018  ? dx 05/28/18  ? Diabetes mellitus without complication ([place])   ? Hyperlipidemia   ? Hypertension   ? Iron deficiency anemia   ? Neuromuscular disorder ([place])   ? neuropathy feet/hands  ? Perforating neurotrophic ulcer of foot ([place])   ? Stage 4 chronic kidney disease ([place])   ?  ? ?Significant Hospital Events: ?Including procedures, antibiotic start and stop dates in addition to other pertinent events   ?3/20 admitted, PCCM consult ?3/21 thoracentesis ?3/22 bronchoscopy w/ RLL TBBx for 3 year nonresolving infiltrate ?3/23 iHD initiation ? ?Interim History / Subjective:  ?More somnolent and hypoxemic this AM with retention, placed on BIPAP.  Wife at bedside.  Seen by nephro, plan to start HD. ? ?Objective   ?Blood pressure 125/63, pulse 66, temperature 97.9 ?F (36.6 ?C), temperature source Axillary, resp. rate 17, height '5\' 10"'$  (1.778 m), weight 95.4 kg, SpO2 95 %. ?   ?Vent  Mode: PSV;BIPAP ?FiO2 (%):  [50 %] 50 % ?PEEP:  [5 cmH20] 5 cmH20 ?Pressure Support:  [12 cmH20] 12 cmH20  ? ?Intake/Output Summary (Last 24 hours) at 07/25/2021 1247 ?Last data filed at 07/25/2021 0901 ?Gross per 24 hour  ?Intake 244.2 ml  ?Output 35 ml  ?Net 209.2 ml  ? ?Filed Weights  ? 07/24/21 0400 07/24/21 1220 07/25/21 0500  ?Weight: 96.8 kg 97.6 kg 95.4 kg  ? ? ?Examination: ?Somnolent man on BIPAP, not triggering respirations consistently (changed him to [facility] with improvement in MV) ?Lungs diminished particularly on R, no wheezing ?Moves all 4 ext to command ?RASS -1 ? ?Resolved Hospital Problem list   ? ? ?Assessment & Plan:  ?Acute hypoxemic and hypercapneic respiratory failure- in context of persistent bibasilar infiltrates R>L, R transudative effusion with lymphocytosis, and progressive renal disease.  Agree that starting HD may help mental and respiratory status.  Do not really think his chronic effusion and infiltrates are driving this.  Discussed with TRH, until weaned from BIPAP and better mental status, PCCM will take over as primary. ?DM2, HTN, HFpEF ? ?- BIPAP continuous for now (changed from PS to [facility]), keep in ICU, low threshold for intubation ?- iHD today, negative as tolerated ?- f/u bronchoscopy labs and pleural fluid cytology/flow ?- SSI ?- Wife updated at length at bedside ? ?Best Practice (right click and "Reselect all SmartList Selections" daily)  ? ?Diet/type: renal/carn ?DVT prophylaxis: heparin ?GI prophylaxis: NA ?Lines: NA ?Foley:  NA ?  Code Status:  full code ?Last date of multidisciplinary goals of care discussion [discussed with wife] ? ? ?Patient critically ill due to hypoxemic and hypercarbic resp failure ?Interventions to address this today bipap titration, intubation watch ?Risk of deterioration without these interventions is high ? ?I personally spent 41 minutes providing critical care not including any separately billable procedures ? ?Erskine Emery MD ?[facility]  [facility] ? ?Prefer epic messenger for cross cover needs ?If after hours, please call E-link ? ?  ?

## 2021-07-25 NOTE — Progress Notes (Signed)
Pt more lethargic since this morning and oxygenation demand increased. Pt was started on BiPAP 12/5 50% due to ABG. Pt is currently stable at the moment with low RR, RT will continue to monitor ?

## 2021-07-25 NOTE — TOC Progression Note (Signed)
Transition of Care (TOC) - Progression Note  ? ? ?Patient Details  ?Name: Melvin Mcclure ?MRN: [id number] ?Date of Birth: 07-03-1950 ? ?Transition of Care (TOC) CM/SW Contact  ?Angelita Ingles, RN ?Phone Number:[phone] ? ?07/25/2021, 4:25 PM ? ?Clinical Narrative:    ?CM received call from nurse requesting advance directive info. Advance directive packet delivered to wife per request. Wife to review and call for further assistance when patient is more alert and oriented per wife.  ? ? ?  ?  ? ?Expected Discharge Plan and Services ?  ?  ?  ?  ?  ?                ?  ?  ?  ?  ?  ?  ?  ?  ?  ?  ? ? ?Social Determinants of Health (SDOH) Interventions ?  ? ?Readmission Risk Interventions ? ?  04/20/2020  ?  7:52 PM  ?Readmission Risk Prevention Plan  ?Transportation Screening Complete  ?Medication Review Press photographer) Complete  ?PCP or Specialist appointment within 3-5 days of discharge Complete  ?[place] or Home Care Consult Complete  ?SW Recovery Care/Counseling Consult Complete  ?Palliative Care Screening Not Applicable  ?[place] Not Applicable  ? ? ?

## 2021-07-25 NOTE — Progress Notes (Signed)
Patient does get sleepy off BIPAP. ?Wife would like for him to try to have something to eat.  Patient demanding food. ? ?Wife understands if he becomes somnolent while eating we will be forced to make him NPO again. ? ?Renal diet ordered. ? ?Erskine Emery MD ?

## 2021-07-25 NOTE — Progress Notes (Addendum)
Admit: 07/22/2021 ?LOS: 2 ? ?Melvin Mcclure is a/an 71 y.o. male with a past medical history notable for renal failure (transplant candidate; left brachiocephalic AV fistula created 89/37/34 ), diastolic CHF, HTN, K8JG who presents with acute hypoxic respiratory failure.    ? ?Subjective:  ?Wife and nursing at bedside concerned about sleepiness and confusion. Wife denies prior hospitalizations with confusion. He continues to ask if he can eat and why is this situation happening.  ? ?03/22 0701 - 03/23 0700 ?In: 184.3 [P.O.:50; I.V.:10.2; IV Piggyback:124.1] ?Out: 35 [Urine:35] ? ?Filed Weights  ? 07/24/21 0400 07/24/21 1220 07/25/21 0500  ?Weight: 96.8 kg 97.6 kg 95.4 kg  ? ? ?Scheduled Meds: ? amLODipine  10 mg Oral Daily  ? aspirin  81 mg Oral Daily  ? calcitRIOL  0.25 mcg Oral QODAY  ? calcium acetate  667 mg Oral TID WC  ? calcium carbonate  2 tablet Oral QHS  ? carvedilol  12.5 mg Oral BID WC  ? Chlorhexidine Gluconate Cloth  6 each Topical Daily  ? gabapentin  300 mg Oral BID  ? heparin  5,000 Units Subcutaneous Q8H  ? hydrALAZINE  50 mg Oral TID  ? insulin aspart  0-5 Units Subcutaneous QHS  ? insulin aspart  0-6 Units Subcutaneous TID WC  ? insulin glargine-yfgn  16 Units Subcutaneous Daily  ? isosorbide mononitrate  60 mg Oral QPM  ? meclizine  25 mg Oral BID  ? mouth rinse  15 mL Mouth Rinse BID  ? sodium chloride flush  3 mL Intravenous Q12H  ? ?Continuous Infusions: ? sodium chloride Stopped (07/24/21 2238)  ? furosemide Stopped (07/24/21 2208)  ? ?PRN Meds:.sodium chloride, acetaminophen **OR** acetaminophen, albuterol, calcium carbonate (dosed in mg elemental calcium), docusate sodium, feeding supplement (NEPRO CARB STEADY), hydrALAZINE, HYDROcodone-acetaminophen, hydrOXYzine, ondansetron **OR** ondansetron (ZOFRAN) IV, phenol, senna-docusate, sorbitol, zolpidem ? ?Current Labs: reviewed K 4.6, Na 134, Cr 5.8>6.16>7.93, hgb 11>10, CBG 224 ? ? ?Physical Exam:  Blood pressure (!) 119/57, pulse 69,  temperature 97.9 ?F (36.6 ?C), temperature source Axillary, resp. rate (!) 28, height _0  (1.778 m), weight 95.4 kg, SpO2 94 %. ?General: Appears ill, no acute distress. Age appropriate. Wife present and attentive at bedside ?Cardiac: RRR, normal heart sounds, no murmurs ?Respiratory: Right basilar crackles. CTA in all other lung fields. Decreased respiratory drive.  ?Abdomen: soft, nontender, nondistended ?Extremities: No LE edema or cyanosis. ?Neuro: somewhat alert and oriented to self, no focal deficits ?Psych: mild confusion/delirium ? ?A/P ?Melvin Mcclure is a/an 71 y.o. male with a past medical history notable for renal failure (transplant candidate; left brachiocephalic AV fistula created 81/15/72 ), diastolic CHF, HTN, I2MB.   ?  ?Acute hypoxic respiratory failure ?Hypervolemia 2/2 AoC CKD5, worsening  c/f Uremia ?H/o CHF  ?Extubated in ICU yesterday s/p bronch/BAL. Continues to desat to 87% on 6L HFNC. S/p high dose IV lasix minimal UOP documented. Now significantly altered and somnolent; concern for uremia  ?- Start HD ?-Continue to monitor daily Cr, Dose meds for GFR<15 ?-Monitor Daily I/Os, Daily weight  ?-Maintain MAP>65 for optimal renal perfusion.   ?-Respiratory management per CCM ? ?Hypertension, stable ?Normotensive.  ?-Continue antihypertensive per primary ?  ?Electrolytes ?K 4.6, Ca corrected 7.9, phos 10.7. BUN 118 ?- AM RFP ?- Consider lokelma when necessary ?- Continue calcium, phoslo, and calcitriol ?  ?Anemia due to chronic disease, stable ?-Transfuse for Hgb<7 g/dL ?  ?T2DM ?-manage per primary team ?  ? ?Recent Labs  ?Lab 07/23/21 ?0406 07/24/21 ?  0211 07/25/21 ?0720 07/25/21 ?0839  ?NA 139 136 134* 133*  ?K 3.5 3.6 4.6 4.8  ?CL 96* 93* 91*  --   ?CO2 _0 --   ?GLUCOSE 99 91 230*  --   ?BUN 77* 89* 118*  --   ?CREATININE 5.83* 6.16* 7.93*  --   ?CALCIUM 8.0* 7.9* 7.4*  --   ?PHOS  --   --  10.7*  --   ? ?Recent Labs  ?Lab 07/22/21 ?0650 07/22/21 ?1408 07/23/21 ?0406 07/24/21 ?1735  07/25/21 ?0720 07/25/21 ?0839  ?WBC 8.6  --  7.6 8.7 8.3  --   ?NEUTROABS 6.1  --   --   --   --   --   ?HGB 12.3*   < > 11.2* 11.2* 10.6* 12.2*  ?HCT 38.9*   < > 35.3* 34.9* 33.6* 36.0*  ?MCV 88.6  --  89.6 87.3 88.0  --   ?PLT 221  --  191 207 201  --   ? < > = values in this interval not displayed.  ? ?Melvin Fee, DO ?07/23/2021, 8:04 AM ?PGY-3, [place] ? ? ? ? ? ?

## 2021-07-25 NOTE — Progress Notes (Signed)
Inpatient Diabetes Program Recommendations ? ?AACE/ADA: New Consensus Statement on Inpatient Glycemic Control (2015) ? ?Target Ranges:  Prepandial:   less than 140 mg/dL ?     Peak postprandial:   less than 180 mg/dL (1-2 hours) ?     Critically ill patients:  140 - 180 mg/dL  ? ?Lab Results  ?Component Value Date  ? GLUCAP 180 (H) 07/25/2021  ? HGBA1C 7.0 (H) 07/22/2021  ? ? ?Review of Glycemic Control ? ?Diabetes history: Type 2 DM ?Outpatient Diabetes medications: Toujeo 20 units QD ?Current orders for Inpatient glycemic control: Semglee16 units QD, Novolog 0-6 units TID & HS ? ?Inpatient Diabetes Program Recommendations:   ?While pt is NPO: ?Change Novolog correction scale to 0-6 units q 4 hrs. ? ?Thank you, ?Nani Gasser Aynsley Fleet, RN, MSN, CDE  ?Diabetes Coordinator ?Inpatient Glycemic Control Team ?Team Pager [phone] (8am-5pm) ?07/25/2021 2:31 PM ? ? ? ? ? ?

## 2021-07-26 DIAGNOSIS — J9621 Acute and chronic respiratory failure with hypoxia: Secondary | ICD-10-CM | POA: Diagnosis not present

## 2021-07-26 LAB — CBC
HCT: 35.7 % — ABNORMAL LOW (ref 39.0–52.0)
Hemoglobin: 11.6 g/dL — ABNORMAL LOW (ref 13.0–17.0)
MCH: 28.2 pg (ref 26.0–34.0)
MCHC: 32.5 g/dL (ref 30.0–36.0)
MCV: 86.9 fL (ref 80.0–100.0)
Platelets: 183 10*3/uL (ref 150–400)
RBC: 4.11 MIL/uL — ABNORMAL LOW (ref 4.22–5.81)
RDW: 15.7 % — ABNORMAL HIGH (ref 11.5–15.5)
WBC: 9 10*3/uL (ref 4.0–10.5)
nRBC: 0 % (ref 0.0–0.2)

## 2021-07-26 LAB — URINALYSIS, COMPLETE (UACMP) WITH MICROSCOPIC
Bilirubin Urine: NEGATIVE
Glucose, UA: NEGATIVE mg/dL
Hgb urine dipstick: NEGATIVE
Ketones, ur: NEGATIVE mg/dL
Leukocytes,Ua: NEGATIVE
Nitrite: NEGATIVE
Protein, ur: 30 mg/dL — AB
Specific Gravity, Urine: 1.015 (ref 1.005–1.030)
pH: 5 (ref 5.0–8.0)

## 2021-07-26 LAB — RENAL FUNCTION PANEL
Albumin: 3.1 g/dL — ABNORMAL LOW (ref 3.5–5.0)
Anion gap: 15 (ref 5–15)
BUN: 109 mg/dL — ABNORMAL HIGH (ref 8–23)
CO2: 24 mmol/L (ref 22–32)
Calcium: 7.2 mg/dL — ABNORMAL LOW (ref 8.9–10.3)
Chloride: 95 mmol/L — ABNORMAL LOW (ref 98–111)
Creatinine, Ser: 8.06 mg/dL — ABNORMAL HIGH (ref 0.61–1.24)
GFR, Estimated: 7 mL/min — ABNORMAL LOW (ref >60)
Glucose, Bld: 216 mg/dL — ABNORMAL HIGH (ref 70–99)
Phosphorus: 9.1 mg/dL — ABNORMAL HIGH (ref 2.5–4.6)
Potassium: 4.8 mmol/L (ref 3.5–5.1)
Sodium: 134 mmol/L — ABNORMAL LOW (ref 135–145)

## 2021-07-26 LAB — HEPATITIS B SURFACE ANTIGEN: Hepatitis B Surface Ag: NONREACTIVE

## 2021-07-26 LAB — GLUCOSE, CAPILLARY
Glucose-Capillary: 212 mg/dL — ABNORMAL HIGH (ref 70–99)
Glucose-Capillary: 181 mg/dL — ABNORMAL HIGH (ref 70–99)
Glucose-Capillary: 126 mg/dL — ABNORMAL HIGH (ref 70–99)
Glucose-Capillary: 117 mg/dL — ABNORMAL HIGH (ref 70–99)

## 2021-07-26 LAB — CULTURE, RESPIRATORY W GRAM STAIN: Culture: NORMAL

## 2021-07-26 LAB — CULTURE, BAL-QUANTITATIVE W GRAM STAIN
Culture: >=100000 — AB
Gram Stain: NONE SEEN

## 2021-07-26 LAB — BODY FLUID CULTURE W GRAM STAIN: Culture: NO GROWTH

## 2021-07-26 LAB — HEPATITIS B SURFACE ANTIBODY, QUANTITATIVE: Hep B S AB Quant (Post): <3.1 m[IU]/mL — ABNORMAL LOW (ref >9.9)

## 2021-07-26 LAB — FUNGUS STAIN

## 2021-07-26 LAB — FUNGAL STAIN REFLEX

## 2021-07-26 LAB — HEPATITIS B SURFACE ANTIBODY,QUALITATIVE: Hep B S Ab: NONREACTIVE

## 2021-07-26 MED ORDER — HEPARIN SODIUM (PORCINE) 1000 UNIT/ML DIALYSIS
1000.0000 [IU] | INTRAMUSCULAR | Status: DC | PRN
  Filled 2021-07-26: qty 1

## 2021-07-26 MED ORDER — GABAPENTIN 100 MG PO CAPS
100.0000 mg | ORAL_CAPSULE | Freq: Every day | ORAL | Status: DC
  Administered 2021-07-26 – 2021-07-28 (×3): 100 mg via ORAL
  Filled 2021-07-26 (×3): qty 1

## 2021-07-26 MED ORDER — PENTAFLUOROPROP-TETRAFLUOROETH EX AERO: 1.0000 "application " | INHALATION_SPRAY | CUTANEOUS | Status: DC | PRN

## 2021-07-26 MED ORDER — ALTEPLASE 2 MG IJ SOLR
2.0000 mg | Freq: Once | INTRAMUSCULAR | Status: DC | PRN
  Filled 2021-07-26: qty 2

## 2021-07-26 MED ORDER — LIDOCAINE-PRILOCAINE 2.5-2.5 % EX CREA
1.0000 "application " | TOPICAL_CREAM | CUTANEOUS | Status: DC | PRN
  Filled 2021-07-26: qty 5

## 2021-07-26 MED ORDER — AMLODIPINE BESYLATE 5 MG PO TABS
5.0000 mg | ORAL_TABLET | Freq: Every day | ORAL | Status: DC
Start: 2021-07-26 — End: 2021-07-30
  Administered 2021-07-26 – 2021-07-30 (×5): 5 mg via ORAL
  Filled 2021-07-26 (×5): qty 1

## 2021-07-26 MED ORDER — SODIUM CHLORIDE 0.9 % IV SOLN: 100.0000 mL | INTRAVENOUS | Status: DC | PRN

## 2021-07-26 MED ORDER — BISACODYL 10 MG RE SUPP
10.0000 mg | Freq: Once | RECTAL | Status: AC
  Administered 2021-07-26: 10 mg via RECTAL
  Filled 2021-07-26: qty 1

## 2021-07-26 MED ORDER — POLYETHYLENE GLYCOL 3350 17 G PO PACK
17.0000 g | PACK | Freq: Every day | ORAL | Status: DC
  Administered 2021-07-26 – 2021-08-09 (×13): 17 g via ORAL
  Filled 2021-07-26 (×17): qty 1

## 2021-07-26 MED ORDER — INSULIN GLARGINE-YFGN 100 UNIT/ML ~~LOC~~ SOLN
20.0000 [IU] | Freq: Every day | SUBCUTANEOUS | Status: DC
  Administered 2021-07-26 – 2021-08-02 (×8): 20 [IU] via SUBCUTANEOUS
  Filled 2021-07-26 (×9): qty 0.2

## 2021-07-26 MED ORDER — DOCUSATE SODIUM 100 MG PO CAPS
100.0000 mg | ORAL_CAPSULE | Freq: Two times a day (BID) | ORAL | Status: DC
  Administered 2021-07-26 – 2021-08-09 (×28): 100 mg via ORAL
  Filled 2021-07-26 (×33): qty 1

## 2021-07-26 MED ORDER — LIDOCAINE HCL (PF) 1 % IJ SOLN
5.0000 mL | INTRAMUSCULAR | Status: DC | PRN
  Filled 2021-07-26: qty 5

## 2021-07-26 NOTE — Progress Notes (Signed)
? ?NAME:  Melvin Mcclure, MRN:  [id number], DOB:  Dec 10, 1950, LOS: 3 ?ADMISSION DATE:  07/22/2021, CONSULTATION DATE:  07/22/21 ?REFERRING MD:  Dr. Lorin Mercy, CHIEF COMPLAINT:  Recurrent pleural effusion ? ?History of Present Illness:  ?71 year old gentleman with past medical history of CKD 5, HFpEF, type 2 diabetes, strongyloidiasis infection status post ivermectin treatment, who presents to the hospital for worsening shortness of breath 3-4 days ago.  He is on 2 L of oxygen at home.  He denies coughing, sputum production or wheezing.  Denies fever, night sweat or weight loss.  Reports compliance to diuretics but urine output has been decreased.  He quit smoking in 1960s. ? ?Patient have history of recurrent right pleural effusion.  Has had thoracentesis in 12/2019, 04/2020 and most recently 05/24/2021.  Lights criteria for all of them were transudative. ? ?Pertinent  Medical History  ? ?Past Medical History:  ?Diagnosis Date  ? Anemia in chronic kidney disease (CKD)   ? stage 5  ? Anxiety   ? Arthritis   ? CHF (congestive heart failure) ([place]) 05/28/2018  ? dx 05/28/18  ? Diabetes mellitus without complication ([place])   ? Hyperlipidemia   ? Hypertension   ? Iron deficiency anemia   ? Neuromuscular disorder ([place])   ? neuropathy feet/hands  ? Perforating neurotrophic ulcer of foot ([place])   ? Stage 4 chronic kidney disease ([place])   ?  ? ?Significant Hospital Events: ?Including procedures, antibiotic start and stop dates in addition to other pertinent events   ?3/20 admitted, PCCM consult ?3/21 thoracentesis ?3/22 bronchoscopy w/ RLL TBBx for 3 year nonresolving infiltrate ?3/23 iHD initiation ? ?Interim History / Subjective:  ?Alert; responding to questions appropriately. Pt states he did not tolerate BiPAP last night, causing him to feel "out of it". Tolerated p.o. meal yesterday. Wife at bedside. Receiving HD at bedside. ? ?Objective   ?Blood pressure 133/86, pulse 69, temperature 97.7 ?F (36.5 ?C), temperature source Oral,  resp. rate (!) 24, height '5\' 10"'$  (1.778 m), weight 95.7 kg, SpO2 97 %. ?   ?Vent Mode: PCV;BIPAP ?FiO2 (%):  [50 %] 50 % ?Set Rate:  [10 bmp] 10 bmp ?PEEP:  [5 cmH20] 5 cmH20 ?Pressure Support:  [12 cmH20] 12 cmH20  ? ?Intake/Output Summary (Last 24 hours) at 07/26/2021 0847 ?Last data filed at 07/26/2021 0235 ?Gross per 24 hour  ?Intake 399.95 ml  ?Output 2300 ml  ?Net -1900.05 ml  ? ?Filed Weights  ? 07/24/21 1220 07/25/21 0500 07/26/21 0500  ?Weight: 97.6 kg 95.4 kg 95.7 kg  ? ? ?Examination: ?Physical Exam ?Constitutional:   ?   General: He is not in acute distress. ?   Interventions: Nasal cannula in place.  ?Cardiovascular:  ?   Rate and Rhythm: Normal rate and regular rhythm.  ?   Heart sounds: Normal heart sounds.  ?Pulmonary:  ?   Effort: Pulmonary effort is normal.  ?   Breath sounds: Decreased breath sounds present. No wheezing, rhonchi or rales.  ?Skin: ?   General: Skin is warm and dry.  ?Neurological:  ?   Mental Status: He is alert.  ?Psychiatric:  ?   Comments: Answer questions appropriately.  ? ? ?Resolved Hospital Problem list   ? ? ?Assessment & Plan:  ?Acute hypoxemic and hypercapneic respiratory failure-  ?Patient received HD yesterday and again today. Mentation and respiratory status has improved. On exam, patient alert and answered questions appropriately. Patient tolerating HFNC sats >96%. ?-resp clx- normal flora, no pseudomonas or staph aureus;  fungal clx pending  ?-wean down 02 demand as tolerated  ? ?DM2, HTN, HFpEF ?Normotensive; hypervolemic 2/2 CKD5 ?-HD again today ?-SSI ?-Daily renal panel ?-Monitor daily I/Os, daily weight ?-Continue antihypertensives ? ?Anemia of chronic disease, stable ?-transfuse for hgb <7 g/dL ? ? ? ?Best Practice (right click and "Reselect all SmartList Selections" daily)  ? ?Diet/type: renal/carn ?DVT prophylaxis: heparin ?GI prophylaxis: NA ?Lines: NA ?Foley:  NA ?Code Status:  full code ?Last date of multidisciplinary goals of care discussion [discussed with  wife] ? ? ?Patient critically ill due to hypoxemic and hypercarbic resp failure ?Interventions to address this today bipap titration, intubation watch ?Risk of deterioration without these interventions is high ? ?I personally spent 41 minutes providing critical care not including any separately billable procedures ? ?Erskine Emery MD ?[facility] Pulmonary Critical Care ? ?Prefer epic messenger for cross cover needs ?If after hours, please call E-link ? ?  ?

## 2021-07-26 NOTE — Progress Notes (Signed)
Straight cath performed at this time. Peri care performed prior to straight cath. Sterile technique maintained during catheterization. See flow sheet for output. Patient tolerated well. Urine sample sent to lab.  ?

## 2021-07-26 NOTE — Progress Notes (Incomplete)
07/26/2021 ?I saw and evaluated the patient. Discussed with resident and agree with resident's findings and plan as documented in the resident's note. ? ?*** ?BUN remains over 100 ?Plan for another HD session today ?Mental status waxes and wanes ? ? ? ?Erskine Emery MD ?[facility] Pulmonary Critical Care ?Prefer epic messenger for cross cover needs ?If after hours, please call E-link ? ?

## 2021-07-26 NOTE — Progress Notes (Signed)
Admit: 07/22/2021 ?LOS: 3 ? ?Melvin Mcclure is a/an 71 y.o. male with a past medical history notable for renal failure (transplant candidate; left brachiocephalic AV fistula created 18/56/31 ), diastolic CHF, HTN, S9FW who presents with acute hypoxic respiratory failure.    ? ?Subjective:  ?Underwent first HD yest afternoon via AVF  with 2 liters removed-  is hemodynamically stable but still with large O2 requirement-  had 300 of UOP by cath -  no labs yet this AM-  it looks like have been ordered with dialysis.  He answers direct questions appropriately but still going off on tangents ? ?03/23 0701 - 03/24 0700 ?In: 400 [P.O.:340; IV YOVZCHYIF:02] ?Out: 2300 [Urine:300] ? ?Filed Weights  ? 07/24/21 1220 07/25/21 0500 07/26/21 0500  ?Weight: 97.6 kg 95.4 kg 95.7 kg  ? ? ?Scheduled Meds: ? amLODipine  10 mg Oral Daily  ? aspirin  81 mg Oral Daily  ? calcitRIOL  0.25 mcg Oral QODAY  ? calcium acetate  667 mg Oral TID WC  ? calcium carbonate  2 tablet Oral QHS  ? carvedilol  6.25 mg Oral BID WC  ? Chlorhexidine Gluconate Cloth  6 each Topical Daily  ? Chlorhexidine Gluconate Cloth  6 each Topical Q0600  ? gabapentin  300 mg Oral BID  ? heparin  5,000 Units Subcutaneous Q8H  ? hydrALAZINE  50 mg Oral TID  ? insulin aspart  0-5 Units Subcutaneous QHS  ? insulin aspart  0-6 Units Subcutaneous TID WC  ? insulin glargine-yfgn  16 Units Subcutaneous Daily  ? isosorbide mononitrate  60 mg Oral QPM  ? meclizine  25 mg Oral BID  ? mouth rinse  15 mL Mouth Rinse BID  ? sodium chloride flush  3 mL Intravenous Q12H  ? ?Continuous Infusions: ? sodium chloride Stopped (07/24/21 2238)  ? sodium chloride    ? sodium chloride    ? ?PRN Meds:.sodium chloride, sodium chloride, sodium chloride, acetaminophen **OR** acetaminophen, albuterol, alteplase, calcium carbonate (dosed in mg elemental calcium), docusate sodium, feeding supplement (NEPRO CARB STEADY), heparin, hydrALAZINE, HYDROcodone-acetaminophen, hydrOXYzine, lidocaine (PF),  lidocaine-prilocaine, ondansetron **OR** ondansetron (ZOFRAN) IV, pentafluoroprop-tetrafluoroeth, phenol, senna-docusate, sorbitol, zolpidem ? ?Current Labs: reviewed K 4.6, Na 134, Cr 5.8>6.16>7.93, hgb 11>10, CBG 224 ? ? ?Physical Exam:  Blood pressure 132/60, pulse 64, temperature (!) 97.5 ?F (36.4 ?C), temperature source Axillary, resp. rate 18, height _0  (1.778 m), weight 95.7 kg, SpO2 97 %. ?General: Appears ill, no acute distress. Age appropriate. Wife present and attentive at bedside ?Cardiac: RRR, normal heart sounds, no murmurs ?Respiratory: Right basilar crackles. CTA in all other lung fields. Decreased respiratory drive.  ?Abdomen: soft, nontender, nondistended ?Extremities: No LE edema or cyanosis. ?Neuro: somewhat alert and oriented to self, no focal deficits ?Psych: mild confusion/delirium ? ?A/P ?Melvin Mcclure is a/an 71 y.o. male with a past medical history notable for renal failure (transplant candidate; left brachiocephalic AV fistula created 77/41/28 ), diastolic CHF, HTN, N8MV.   ?  ?Acute hypoxic respiratory failure ?Hypervolemia 2/2 AoC CKD5, worsening  c/f Uremia ?H/o CHF  ?Extubated in ICU yesterday s/p bronch/BAL. Continues to desat to 87% on 6L HFNC. S/p high dose IV lasix minimal UOP documented. Now significantly altered and somnolent; concern for uremia  ?- Start HD-- had first HD yesterday -  second is planned for today  ?-Continue to monitor daily Cr, Dose meds for GFR<15 ?-Monitor Daily I/Os, Daily weight  ?-Maintain MAP>65 for optimal renal perfusion.   ?-Respiratory management per CCM ? ?Hypertension,  stable ?Normotensive.  ?-Continue antihypertensive per primary ?Is noted to be on 3 drugs for HTN-  I am going to decrease the norvasc to 5 mg as anticipate that BP will lower with further volume removal ?  ?Electrolytes ?K 4.6, Ca corrected 7.9, phos 10.7. BUN 118 ?- AM RFP ?- Consider lokelma when necessary ?- Continue calcium, phoslo, and calcitriol ? ?No labs back yet today   ?  ?Anemia due to chronic disease, stable ?-Transfuse for Hgb<7 g/dL ? ?Anemia has not been an issue-  no ESA-  yest hgb 12.2 ?  ?T2DM ?-manage per primary team ? ?Confusion-  ?Hoping will clear some with further HD ?  ? ?Recent Labs  ?Lab 07/23/21 ?0406 07/24/21 ?5631 07/25/21 ?0720 07/25/21 ?4970 07/25/21 ?2032  ?NA 139 136 134* 133* 135  ?K 3.5 3.6 4.6 4.8 4.5  ?CL 96* 93* 91*  --   --   ?CO2 _0 --   --   ?GLUCOSE 99 91 230*  --   --   ?BUN 77* 89* 118*  --   --   ?CREATININE 5.83* 6.16* 7.93*  --   --   ?CALCIUM 8.0* 7.9* 7.4*  --   --   ?PHOS  --   --  10.7*  --   --   ? ?Recent Labs  ?Lab 07/22/21 ?0650 07/22/21 ?1408 07/23/21 ?0406 07/24/21 ?2637 07/25/21 ?0720 07/25/21 ?8588 07/25/21 ?2032  ?WBC 8.6  --  7.6 8.7 8.3  --   --   ?NEUTROABS 6.1  --   --   --   --   --   --   ?HGB 12.3*   < > 11.2* 11.2* 10.6* 12.2* 13.3  ?HCT 38.9*   < > 35.3* 34.9* 33.6* 36.0* 39.0  ?MCV 88.6  --  89.6 87.3 88.0  --   --   ?PLT 221  --  191 207 201  --   --   ? < > = values in this interval not displayed.  ? ?Louis Meckel ? ? ? ? ? ? ?

## 2021-07-27 ENCOUNTER — Inpatient Hospital Stay (HOSPITAL_COMMUNITY): Payer: Medicare HMO

## 2021-07-27 DIAGNOSIS — J9621 Acute and chronic respiratory failure with hypoxia: Secondary | ICD-10-CM | POA: Diagnosis not present

## 2021-07-27 LAB — POCT I-STAT 7, (LYTES, BLD GAS, ICA,H+H)
Acid-Base Excess: 5 mmol/L — ABNORMAL HIGH (ref 0.0–2.0)
Bicarbonate: 31.8 mmol/L — ABNORMAL HIGH (ref 20.0–28.0)
Calcium, Ion: 1.05 mmol/L — ABNORMAL LOW (ref 1.15–1.40)
HCT: 38 % — ABNORMAL LOW (ref 39.0–52.0)
Hemoglobin: 12.9 g/dL — ABNORMAL LOW (ref 13.0–17.0)
O2 Saturation: 97 %
Patient temperature: 98.8
Potassium: 3.7 mmol/L (ref 3.5–5.1)
Sodium: 138 mmol/L (ref 135–145)
TCO2: 34 mmol/L — ABNORMAL HIGH (ref 22–32)
pCO2 arterial: 57.3 mmHg — ABNORMAL HIGH (ref 32–48)
pH, Arterial: 7.353 (ref 7.35–7.45)
pO2, Arterial: 95 mmHg (ref 83–108)

## 2021-07-27 LAB — RENAL FUNCTION PANEL
Albumin: 2.9 g/dL — ABNORMAL LOW (ref 3.5–5.0)
Anion gap: 11 (ref 5–15)
BUN: 81 mg/dL — ABNORMAL HIGH (ref 8–23)
CO2: 27 mmol/L (ref 22–32)
Calcium: 7.1 mg/dL — ABNORMAL LOW (ref 8.9–10.3)
Chloride: 95 mmol/L — ABNORMAL LOW (ref 98–111)
Creatinine, Ser: 7.07 mg/dL — ABNORMAL HIGH (ref 0.61–1.24)
GFR, Estimated: 8 mL/min — ABNORMAL LOW (ref >60)
Glucose, Bld: 204 mg/dL — ABNORMAL HIGH (ref 70–99)
Phosphorus: 5.6 mg/dL — ABNORMAL HIGH (ref 2.5–4.6)
Potassium: 4 mmol/L (ref 3.5–5.1)
Sodium: 133 mmol/L — ABNORMAL LOW (ref 135–145)

## 2021-07-27 LAB — GLUCOSE, CAPILLARY
Glucose-Capillary: 195 mg/dL — ABNORMAL HIGH (ref 70–99)
Glucose-Capillary: 96 mg/dL (ref 70–99)
Glucose-Capillary: 135 mg/dL — ABNORMAL HIGH (ref 70–99)
Glucose-Capillary: 169 mg/dL — ABNORMAL HIGH (ref 70–99)

## 2021-07-27 LAB — CBC
HCT: 32.7 % — ABNORMAL LOW (ref 39.0–52.0)
Hemoglobin: 10.4 g/dL — ABNORMAL LOW (ref 13.0–17.0)
MCH: 27.8 pg (ref 26.0–34.0)
MCHC: 31.8 g/dL (ref 30.0–36.0)
MCV: 87.4 fL (ref 80.0–100.0)
Platelets: 157 10*3/uL (ref 150–400)
RBC: 3.74 MIL/uL — ABNORMAL LOW (ref 4.22–5.81)
RDW: 15.6 % — ABNORMAL HIGH (ref 11.5–15.5)
WBC: 8.2 10*3/uL (ref 4.0–10.5)
nRBC: 0 % (ref 0.0–0.2)

## 2021-07-27 LAB — HEPATITIS B SURFACE ANTIBODY, QUANTITATIVE: Hep B S AB Quant (Post): <3.1 m[IU]/mL — ABNORMAL LOW (ref >9.9)

## 2021-07-27 MED ORDER — CALCIUM ACETATE (PHOS BINDER) 667 MG PO CAPS
1334.0000 mg | ORAL_CAPSULE | Freq: Three times a day (TID) | ORAL | Status: DC
  Administered 2021-07-27 – 2021-08-12 (×39): 1334 mg via ORAL
  Filled 2021-07-27 (×44): qty 2

## 2021-07-27 MED ORDER — FENTANYL CITRATE (PF) 100 MCG/2ML IJ SOLN
50.0000 ug | INTRAMUSCULAR | Status: AC | PRN
  Administered 2021-07-29 (×2): 50 ug via INTRAVENOUS
  Filled 2021-07-27 (×2): qty 2

## 2021-07-27 MED ORDER — FENTANYL CITRATE (PF) 100 MCG/2ML IJ SOLN
INTRAMUSCULAR | Status: AC
  Filled 2021-07-27: qty 2

## 2021-07-27 NOTE — Progress Notes (Signed)
RT note: Patient stated he does not want to wear the CPAP tonight and does not have to. Stop asking him. ?

## 2021-07-27 NOTE — Evaluation (Signed)
Physical Therapy Evaluation ?Patient Details ?Name: Melvin Mcclure ?MRN: [id number] ?DOB: May 17, 1950 ?Today's Date: 07/27/2021 ? ?History of Present Illness ? 71 year old gentleman admitted 3/20 who presents to the hospital for worsening shortness of breath.  Hx of pleural effusion. Pt underwent thoracentesis on 3/21.  On 3/22 had bronchoscopy w/ RLL.  On 3/23 began Intermittent HD. Has been on Bipap and was on 15L HFNC on PT evaluation on 3/25.  PMH:  CKD 5, HFpEF, type 2 diabetes, strongyloidiasis infection status post ivermectin treatment  ?Clinical Impression ? Pt admitted with above diagnosis. Pt was able to stand to [facility] with max assist of 2 persons. Pt very deconditioned as he was fully Independent PTA.  Wife supportive and is a Marine scientist however she does still work. She can take some more time off but unsure of how much.  Feel that pt is a good rehab candidate to be able to return to his prior functional level.  Will follow acutely.   Pt currently with functional limitations due to the deficits listed below (see PT Problem List). Pt will benefit from skilled PT to increase their independence and safety with mobility to allow discharge to the venue listed below.      ?   ? ?Recommendations for follow up therapy are one component of a multi-disciplinary discharge planning process, led by the attending physician.  Recommendations may be updated based on patient status, additional functional criteria and insurance authorization. ? ?Follow Up Recommendations Acute inpatient rehab (3hours/day) ? ?  ?Assistance Recommended at Discharge Set up Supervision/Assistance  ?Patient can return home with the following ? A little help with walking and/or transfers;A little help with bathing/dressing/bathroom;Help with stairs or ramp for entrance;Assistance with cooking/housework (if mental status improves) ? ?  ?Equipment Recommendations None recommended by PT  ?Recommendations for Other Services ? Rehab consult  ?  ?Functional  Status Assessment Patient has had a recent decline in their functional status and demonstrates the ability to make significant improvements in function in a reasonable and predictable amount of time.  ? ?  ?Precautions / Restrictions Precautions ?Precautions: Fall ?Restrictions ?Weight Bearing Restrictions: No  ? ?  ? ?Mobility ? Bed Mobility ?Overal bed mobility: Needs Assistance ?Bed Mobility: Rolling, Sidelying to Sit ?Rolling: Mod assist, +2 for physical assistance ?Sidelying to sit: Max assist, +2 for physical assistance ?  ?  ?  ?General bed mobility comments: Mod assist to roll and max assist to bring LEs off bed and to elevate trunk with left UE soreness making it difficult for pt to push up to sittting. ?  ? ?Transfers ?Overall transfer level: Needs assistance ?Equipment used: Ambulation equipment used ?Transfers: Sit to/from Stand, Bed to chair/wheelchair/BSC ?Sit to Stand: Max assist, +2 physical assistance, From elevated surface ?  ?  ?  ?  ?  ?General transfer comment: Pt required max assist to initiate stand and once initiated pt can stand close to upright. Had to assist with end range of hip and trunk extension as difficult to get buttock pads in place due to pt not standing fully upright.  Moved pt to chair on Stedy and then he stood again and placed pt in chair. ?Transfer via Lift Equipment: Stedy ? ?Ambulation/Gait ?  ?  ?  ?  ?  ?  ?  ?  ? ?Stairs ?  ?  ?  ?  ?  ? ?Wheelchair Mobility ?  ? ?Modified Rankin (Stroke Patients Only) ?  ? ?  ? ?Balance Overall balance assessment:  Needs assistance ?Sitting-balance support: Bilateral upper extremity supported, No upper extremity supported, Feet supported ?Sitting balance-Leahy Scale: Poor ?Sitting balance - Comments: Pt at times with posterior and lateral lean therefore needing min assist when sitting.  A period of min guard assist for only for seconds at a time. ?Postural control: Posterior lean, Right lateral lean, Left lateral lean ?Standing balance  support: Bilateral upper extremity supported, During functional activity ?Standing balance-Leahy Scale: Zero ?Standing balance comment: Pt needed max assist of 2 to stand to [facility] and relies on UE and external support ?  ?  ?  ?  ?  ?  ?  ?  ?  ?  ?  ?   ? ? ? ?Pertinent Vitals/Pain Pain Assessment ?Pain Assessment: No/denies pain  ? ? ?Home Living Family/patient expects to be discharged to:: Private residence ?Living Arrangements: Spouse/significant other ?Available Help at Discharge: Available PRN/intermittently (wife is a Marine scientist and works 12 hour shifts) ?Type of Home: House ?Home Access: Stairs to enter ?Entrance Stairs-Rails: None ?Entrance Stairs-Number of Steps: 2 ?Alternate Level Stairs-Number of Steps: 15 with landing ?Home Layout: Two level;Bed/bath upstairs;1/2 bath on main level (may move him downstairs to a bedroom there) ?Home Equipment: Cane - single Barista (2 wheels) ?   ?  ?Prior Function Prior Level of Function : Independent/Modified Independent;Driving;Other (comment) (Retired, Haematologist, cooking) ?  ?  ?  ?  ?  ?  ?  ?  ?  ? ? ?Hand Dominance  ? Dominant Hand: Right ? ?  ?Extremity/Trunk Assessment  ? Upper Extremity Assessment ?Upper Extremity Assessment: Defer to OT evaluation ?  ? ?Lower Extremity Assessment ?Lower Extremity Assessment: RLE deficits/detail;LLE deficits/detail ?RLE Deficits / Details: grossly 3-/5 ?RLE Sensation: history of peripheral neuropathy ?LLE Deficits / Details: grossly 3-/5 ?LLE Sensation: history of peripheral neuropathy ?  ? ?Cervical / Trunk Assessment ?Cervical / Trunk Assessment: Normal  ?Communication  ? Communication: HOH  ?Cognition Arousal/Alertness: Awake/alert ?Behavior During Therapy: Flat affect ?Overall Cognitive Status: Impaired/Different from baseline ?Area of Impairment: Orientation, Attention, Memory, Following commands, Safety/judgement, Awareness, Problem solving ?  ?  ?  ?  ?  ?  ?  ?  ?Orientation Level: Disoriented to, Place, Time,  Situation ?Current Attention Level: Focused ?Memory: Decreased recall of precautions, Decreased short-term memory ?Following Commands: Follows one step commands inconsistently, Follows one step commands with increased time ?Safety/Judgement: Decreased awareness of safety, Decreased awareness of deficits ?Awareness:  (Pre-intellectual) ?Problem Solving: Slow processing, Decreased initiation, Difficulty sequencing, Requires verbal cues, Requires tactile cues ?General Comments: Delayed response time needing repetition of commands. Pt confused at times as well. ?  ?  ? ?  ?General Comments General comments (skin integrity, edema, etc.): 76 bpm, 100% on 15LHFNC at rest and 94% with activity.  BP 148/79 initially and 159/70 once in chair. ? ?  ?Exercises General Exercises - Lower Extremity ?Ankle Circles/Pumps: AROM, Both, 5 reps, Supine ?Long Arc Quad: AROM, Both, 5 reps, Seated ?Heel Slides: AROM, Both, 5 reps, Supine  ? ?Assessment/Plan  ?  ?PT Assessment Patient needs continued PT services  ?PT Problem List Decreased strength;Decreased range of motion;Decreased activity tolerance;Decreased balance;Decreased mobility;Decreased knowledge of use of DME;Decreased safety awareness;Decreased knowledge of precautions;Decreased cognition;Cardiopulmonary status limiting activity ? ?   ?  ?PT Treatment Interventions DME instruction;Gait training;Stair training;Functional mobility training;Therapeutic activities;Therapeutic exercise;Balance training;Patient/family education   ? ?PT Goals (Current goals can be found in the Care Plan section)  ?Acute Rehab PT Goals ?Patient Stated Goal: to go home ?PT  Goal Formulation: With patient ?Time For Goal Achievement: 08/10/21 ?Potential to Achieve Goals: Good ? ?  ?Frequency Min 3X/week ?  ? ? ?Co-evaluation   ?  ?  ?  ?  ? ? ?  ?AM-PAC PT "6 Clicks" Mobility  ?Outcome Measure Help needed turning from your back to your side while in a flat bed without using bedrails?: Total ?Help needed  moving from lying on your back to sitting on the side of a flat bed without using bedrails?: Total ?Help needed moving to and from a bed to a chair (including a wheelchair)?: Total ?Help needed standing up

## 2021-07-27 NOTE — Progress Notes (Signed)
SLP Cancellation Note ? ?Patient Details ?Name: Ralf Konopka ?MRN: [id number] ?DOB: [birth year] ? ? ?Cancelled treatment:       Reason Eval/Treat Not Completed: Patient's level of consciousness;Medical issues which prohibited therapy;Other (comment) (Patient on BiPAP. SLP will continue to follow for readiness to trial PO's when able to wean of BiPAP.) ? ? ?Sonia Baller, MA, CCC-SLP ?Speech Therapy ? ?

## 2021-07-27 NOTE — Progress Notes (Signed)
? ?NAME:  Melvin Mcclure, MRN:  [id number], DOB:  12-Sep-1950, LOS: 4 ?ADMISSION DATE:  07/22/2021, CONSULTATION DATE:  07/22/21 ?REFERRING MD:  Dr. Lorin Mercy, CHIEF COMPLAINT:  Recurrent pleural effusion ? ?History of Present Illness:  ?71 year old gentleman with past medical history of CKD 5, HFpEF, type 2 diabetes, strongyloidiasis infection status post ivermectin treatment, who presents to the hospital for worsening shortness of breath 3-4 days ago.  He is on 2 L of oxygen at home.  He denies coughing, sputum production or wheezing.  Denies fever, night sweat or weight loss.  Reports compliance to diuretics but urine output has been decreased.  He quit smoking in 1960s. ? ?Patient have history of recurrent right pleural effusion.  Has had thoracentesis in 12/2019, 04/2020 and most recently 05/24/2021.  Lights criteria for all of them were transudative. ? ?Pertinent  Medical History  ? ?Past Medical History:  ?Diagnosis Date  ? Anemia in chronic kidney disease (CKD)   ? stage 5  ? Anxiety   ? Arthritis   ? CHF (congestive heart failure) ([place]) 05/28/2018  ? dx 05/28/18  ? Diabetes mellitus without complication ([place])   ? Hyperlipidemia   ? Hypertension   ? Iron deficiency anemia   ? Neuromuscular disorder ([place])   ? neuropathy feet/hands  ? Perforating neurotrophic ulcer of foot ([place])   ? Stage 4 chronic kidney disease ([place])   ?  ? ?Significant Hospital Events: ?Including procedures, antibiotic start and stop dates in addition to other pertinent events   ?3/20 admitted, PCCM consult ?3/21 thoracentesis ?3/22 bronchoscopy w/ RLL TBBx for 3 year nonresolving infiltrate ?3/23 iHD initiation ? ?Interim History / Subjective:  ?No events, wore BIPAP for about 4 hours last night. ?Gets somnolent intermittently without it. ?Getting 3/3 HD this AM. ? ?Objective   ?Blood pressure (!) 155/71, pulse 75, temperature 98.3 ?F (36.8 ?C), temperature source Temporal, resp. rate (!) 23, height '5\' 10"'$  (1.778 m), weight 95.8 kg, SpO2 99 %. ?    ?Vent Mode: PCV;BIPAP ?FiO2 (%):  [50 %] 50 % ?Set Rate:  [10 bmp] 10 bmp ?PEEP:  [5 cmH20] 5 cmH20 ?Pressure Support:  [12 cmH20] 12 cmH20  ? ?Intake/Output Summary (Last 24 hours) at 07/27/2021 0919 ?Last data filed at 07/27/2021 0086 ?Gross per 24 hour  ?Intake 580 ml  ?Output 2501 ml  ?Net -1921 ml  ? ? ?Filed Weights  ? 07/25/21 0500 07/26/21 0500 07/27/21 0145  ?Weight: 95.4 kg 95.7 kg 95.8 kg  ? ? ?Examination: ?No distress ?MMM, trachea midline ?Lungs clear, no accessory muscle use ?Abdomen soft, +BS ?Improving edema ? ?BUN improved ?CXR improved ?CBC stable ? ?Resolved Hospital Problem list   ? ? ?Assessment & Plan:  ?Acute hypoxemic and hypercapneic respiratory failure- volume overload, uremia being proximal cause.  Improving mentation and volume status. ?Chronic bibasilar infiltrates- bronch/biopsy reassuring against malignancy or smoldering infection but final cultures will take weeks to come back. ?DM2, HTN, HFpEF ? ?- BIPAP qHS and PRN ?- Continue ICU level care given intermittent BIPAP dependence and risk of intubation, once he can show that he can stay off BIPAP through day he can be transferred ?- f/u remaining bronch labs ?- Encourage Ambulation and PO intake ?- May benefit from inpatient rehab ?- CXR still looks wet, I think continued removal with HD would be helpful ? ?Best Practice (right click and "Reselect all SmartList Selections" daily)  ? ?Diet/type: renal/carn ?DVT prophylaxis: heparin ?GI prophylaxis: NA ?Lines: NA ?Foley:  NA ?Code Status:  full code ?Last date of multidisciplinary goals of care discussion [full scope, wife updated daily] ? ?Patient critically ill due to hypoxemic and hypercarbic resp failure ?Interventions to address this today bipap titration, intubation watch ?Risk of deterioration without these interventions is high ? ?I personally spent 31 minutes providing critical care not including any separately billable procedures ? ?Erskine Emery MD ?[facility] Pulmonary Critical  Care ? ?Prefer epic messenger for cross cover needs ?If after hours, please call E-link ? ?  ?

## 2021-07-27 NOTE — Plan of Care (Signed)
?  Problem: Education: ?Goal: Ability to demonstrate management of disease process will improve ?Outcome: Not Progressing ?Goal: Ability to verbalize understanding of medication therapies will improve ?Outcome: Not Progressing ?  ?Problem: Activity: ?Goal: Capacity to carry out activities will improve ?Outcome: Not Progressing ?  ?Problem: Education: ?Goal: Knowledge of General Education information will improve ?Description: Including pain rating scale, medication(s)/side effects and non-pharmacologic comfort measures ?Outcome: Not Progressing ?  ?Problem: Health Behavior/Discharge Planning: ?Goal: Ability to manage health-related needs will improve ?Outcome: Not Progressing ?  ?

## 2021-07-27 NOTE — Progress Notes (Signed)
Admit: 07/22/2021 ?LOS: 4 ? ?Melvin Mcclure is a/an 71 y.o. male with a past medical history notable for renal failure (transplant candidate; left brachiocephalic AV fistula created 11/02/14 ), diastolic CHF, HTN, W1UX who presents with acute hypoxic respiratory failure.    ? ?Subjective:  ?No acute events, remains high risk for intubation and remains confused. Tolerated HD yesterday, net uf 2.5L. about to start HD shortly as room and machine is being prepped. Family friend at bedside ? ?03/24 0701 - 03/25 0700 ?In: 52 [P.O.:460] ?Out: 2501 [Stool:1] ? ?Filed Weights  ? 07/25/21 0500 07/26/21 0500 07/27/21 0145  ?Weight: 95.4 kg 95.7 kg 95.8 kg  ? ? ?Scheduled Meds: ? amLODipine  5 mg Oral Daily  ? aspirin  81 mg Oral Daily  ? calcitRIOL  0.25 mcg Oral QODAY  ? calcium acetate  667 mg Oral TID WC  ? calcium carbonate  2 tablet Oral QHS  ? Chlorhexidine Gluconate Cloth  6 each Topical Daily  ? Chlorhexidine Gluconate Cloth  6 each Topical Q0600  ? docusate sodium  100 mg Oral BID  ? gabapentin  100 mg Oral Daily  ? heparin  5,000 Units Subcutaneous Q8H  ? hydrALAZINE  50 mg Oral TID  ? insulin aspart  0-5 Units Subcutaneous QHS  ? insulin aspart  0-6 Units Subcutaneous TID WC  ? insulin glargine-yfgn  20 Units Subcutaneous Daily  ? isosorbide mononitrate  60 mg Oral QPM  ? meclizine  25 mg Oral BID  ? mouth rinse  15 mL Mouth Rinse BID  ? polyethylene glycol  17 g Oral Daily  ? sodium chloride flush  3 mL Intravenous Q12H  ? ?Continuous Infusions: ? sodium chloride Stopped (07/24/21 2238)  ? sodium chloride    ? sodium chloride    ? ?PRN Meds:.sodium chloride, sodium chloride, sodium chloride, acetaminophen **OR** acetaminophen, albuterol, alteplase, calcium carbonate (dosed in mg elemental calcium), docusate sodium, feeding supplement (NEPRO CARB STEADY), heparin, hydrALAZINE, HYDROcodone-acetaminophen, hydrOXYzine, lidocaine (PF), lidocaine-prilocaine, ondansetron **OR** ondansetron (ZOFRAN) IV,  pentafluoroprop-tetrafluoroeth, phenol, senna-docusate, sorbitol, zolpidem ? ?Current Labs: reviewed K 4.6, Na 134, Cr 5.8>6.16>7.93, hgb 11>10, CBG 224 ? ? ?Physical Exam:  Blood pressure 140/67, pulse 71, temperature 98.3 ?F (36.8 ?C), temperature source Oral, resp. rate 15, height '5\' 10"'$  (1.778 m), weight 95.8 kg, SpO2 100 %. ?Gen: confused, ill appearing ?CV: RRR ?Resp: diminished air entry bibasilar ?Abd: soft ?Ext: no sig edema b/l Les ?Neuro: confused, somewhat lethargic this am but does respond to tactile/vocal stim, hard of hearing ? ?  ? ?Recent Labs  ?Lab 07/24/21 ?0353 07/25/21 ?0720 07/25/21 ?3235 07/25/21 ?2032 07/26/21 ?0735  ?NA 136 134* 133* 135 134*  ?K 3.6 4.6 4.8 4.5 4.8  ?CL 93* 91*  --   --  95*  ?CO2 29 26  --   --  24  ?GLUCOSE 91 230*  --   --  216*  ?BUN 89* 118*  --   --  109*  ?CREATININE 6.16* 7.93*  --   --  8.06*  ?CALCIUM 7.9* 7.4*  --   --  7.2*  ?PHOS  --  10.7*  --   --  9.1*  ? ?Recent Labs  ?Lab 07/22/21 ?0650 07/22/21 ?1408 07/24/21 ?0353 07/25/21 ?0720 07/25/21 ?5732 07/25/21 ?2032 07/26/21 ?0803  ?WBC 8.6   < > 8.7 8.3  --   --  9.0  ?NEUTROABS 6.1  --   --   --   --   --   --   ?  HGB 12.3*   < > 11.2* 10.6* 12.2* 13.3 11.6*  ?HCT 38.9*   < > 34.9* 33.6* 36.0* 39.0 35.7*  ?MCV 88.6   < > 87.3 88.0  --   --  86.9  ?PLT 221   < > 207 201  --   --  183  ? < > = values in this interval not displayed.  ? ?A/P ?Melvin Mcclure is a/an 71 y.o. male with a past medical history notable for renal failure (transplant candidate; left brachiocephalic AV fistula created 30/07/62 ), diastolic CHF, HTN, U6JF.   ?  ?Acute hypoxic respiratory failure ?Hypervolemia 2/2 AoC CKD5, worsening  c/f Uremia ?H/o CHF ?-started HD#1 via his functional LUE AVF on 3/23 due to concerns for uremia and inadequate diuresis with high dose lasix. #3 treatment today, anticipating his next treatment to be on Monday. CLIP. ?-Continue to monitor daily Cr, Dose meds for GFR<15 ?-Maintain MAP>65 for optimal renal  perfusion.   ?-Respiratory management per CCM ?-Avoid nephrotoxic medications including NSAIDs and iodinated intravenous contrast exposure unless the latter is absolutely indicated.  Preferred narcotic agents for pain control are hydromorphone, fentanyl, and methadone. Morphine should not be used. Avoid Baclofen and avoid oral sodium phosphate and magnesium citrate based laxatives / bowel preps. Continue strict Input and Output monitoring. Will monitor the patient closely with you and intervene or adjust therapy as indicated by changes in clinical status/labs  ? ?Hypertension, stable ?-uf as tolerated ? ?Rt pleural effusion ?-s/p thora 3/22>transudative ?  ?Anemia due to chronic disease, stable ?-Transfuse for Hgb<7 g/dL ? ?Anemia of CKD ?-transfuse PRN for hgb <7 ?-hgb currently at goal, not on ESA yet ? ?CKD-MBD ?-increase phoslo to 2 tabs tidac, c/w calcitriol. Renal diet ?  ?T2DM ?-manage per primary team ? ?Confusion-  ?-uremia vs other causes? C/w HD, will see if this helps clear up his mentation ? ? ?Gean Quint, MD ?[facility] ? ?

## 2021-07-28 ENCOUNTER — Encounter (HOSPITAL_COMMUNITY): Payer: Self-pay | Admitting: Internal Medicine

## 2021-07-28 DIAGNOSIS — J9621 Acute and chronic respiratory failure with hypoxia: Secondary | ICD-10-CM | POA: Diagnosis not present

## 2021-07-28 LAB — AEROBIC/ANAEROBIC CULTURE W GRAM STAIN (SURGICAL/DEEP WOUND): Culture: NORMAL

## 2021-07-28 LAB — GLUCOSE, CAPILLARY
Glucose-Capillary: 122 mg/dL — ABNORMAL HIGH (ref 70–99)
Glucose-Capillary: 187 mg/dL — ABNORMAL HIGH (ref 70–99)
Glucose-Capillary: 93 mg/dL (ref 70–99)
Glucose-Capillary: 174 mg/dL — ABNORMAL HIGH (ref 70–99)

## 2021-07-28 MED ORDER — BISACODYL 10 MG RE SUPP
10.0000 mg | Freq: Once | RECTAL | Status: AC
Start: 2021-07-28 — End: 2021-07-28
  Administered 2021-07-28: 10 mg via RECTAL
  Filled 2021-07-28: qty 1

## 2021-07-28 MED ORDER — SENNOSIDES-DOCUSATE SODIUM 8.6-50 MG PO TABS
1.0000 | ORAL_TABLET | Freq: Every day | ORAL | Status: DC
  Administered 2021-07-28 – 2021-08-08 (×12): 1 via ORAL
  Filled 2021-07-28 (×14): qty 1

## 2021-07-28 MED ORDER — NEPRO/CARBSTEADY PO LIQD
237.0000 mL | Freq: Two times a day (BID) | ORAL | Status: DC
  Administered 2021-07-28 – 2021-08-06 (×10): 237 mL via ORAL
  Filled 2021-07-28 (×2): qty 237

## 2021-07-28 NOTE — Progress Notes (Signed)
Admit: 07/22/2021 ?LOS: 5 ? ?Melvin Mcclure is a/an 71 y.o. male with a past medical history notable for renal failure (transplant candidate; left brachiocephalic AV fistula created 95/09/32 ), diastolic CHF, HTN, I7TI who presents with acute hypoxic respiratory failure.    ? ?Subjective:  ?No acute events, remains bipap dependent. Resting comfortably currently, discussed with wife at the bedside. ? ?03/25 0701 - 03/26 0700 ?In: 46 [P.O.:580] ?Out: 3500  ? ?Filed Weights  ? 07/26/21 0500 07/27/21 0145 07/28/21 0500  ?Weight: 95.7 kg 95.8 kg 89.2 kg  ? ? ?Scheduled Meds: ? amLODipine  5 mg Oral Daily  ? aspirin  81 mg Oral Daily  ? calcitRIOL  0.25 mcg Oral QODAY  ? calcium acetate  1,334 mg Oral TID WC  ? calcium carbonate  2 tablet Oral QHS  ? Chlorhexidine Gluconate Cloth  6 each Topical Daily  ? Chlorhexidine Gluconate Cloth  6 each Topical Q0600  ? docusate sodium  100 mg Oral BID  ? gabapentin  100 mg Oral Daily  ? heparin  5,000 Units Subcutaneous Q8H  ? hydrALAZINE  50 mg Oral TID  ? insulin aspart  0-5 Units Subcutaneous QHS  ? insulin aspart  0-6 Units Subcutaneous TID WC  ? insulin glargine-yfgn  20 Units Subcutaneous Daily  ? isosorbide mononitrate  60 mg Oral QPM  ? meclizine  25 mg Oral BID  ? mouth rinse  15 mL Mouth Rinse BID  ? polyethylene glycol  17 g Oral Daily  ? sodium chloride flush  3 mL Intravenous Q12H  ? ?Continuous Infusions: ? sodium chloride Stopped (07/24/21 2238)  ? sodium chloride    ? sodium chloride    ? ?PRN Meds:.sodium chloride, sodium chloride, sodium chloride, acetaminophen **OR** acetaminophen, albuterol, alteplase, calcium carbonate (dosed in mg elemental calcium), docusate sodium, feeding supplement (NEPRO CARB STEADY), fentaNYL (SUBLIMAZE) injection, heparin, hydrALAZINE, HYDROcodone-acetaminophen, hydrOXYzine, lidocaine (PF), lidocaine-prilocaine, ondansetron **OR** ondansetron (ZOFRAN) IV, pentafluoroprop-tetrafluoroeth, phenol, senna-docusate, sorbitol,  zolpidem ? ? ?Physical Exam:  Blood pressure (!) 142/72, pulse 72, temperature 99.9 ?F (37.7 ?C), temperature source Axillary, resp. rate 15, height '5\' 10"'$  (1.778 m), weight 89.2 kg, SpO2 100 %. ?Gen: ill appearing ?CV: RRR ?Resp: diminished air entry bibasilar, on bipap ?Abd: soft, ND/NT ?Ext: no sig edema b/l LE's ?Neuro: somewhat lethargic this am but does respond to tactile/vocal stim, hard of hearing, comfortably resting on bipap ?Dialysis access: LUE AVF +b/t ? ?  ? ?Recent Labs  ?Lab 07/25/21 ?0720 07/25/21 ?4580 07/26/21 ?0735 07/27/21 ?0830 07/27/21 ?1242  ?NA 134*   < > 134* 133* 138  ?K 4.6   < > 4.8 4.0 3.7  ?CL 91*  --  95* 95*  --   ?CO2 26  --  24 27  --   ?GLUCOSE 230*  --  216* 204*  --   ?BUN 118*  --  109* 81*  --   ?CREATININE 7.93*  --  8.06* 7.07*  --   ?CALCIUM 7.4*  --  7.2* 7.1*  --   ?PHOS 10.7*  --  9.1* 5.6*  --   ? < > = values in this interval not displayed.  ? ?Recent Labs  ?Lab 07/22/21 ?0650 07/22/21 ?1408 07/25/21 ?0720 07/25/21 ?9983 07/26/21 ?0803 07/27/21 ?0830 07/27/21 ?1242  ?WBC 8.6   < > 8.3  --  9.0 8.2  --   ?NEUTROABS 6.1  --   --   --   --   --   --   ?HGB  12.3*   < > 10.6*   < > 11.6* 10.4* 12.9*  ?HCT 38.9*   < > 33.6*   < > 35.7* 32.7* 38.0*  ?MCV 88.6   < > 88.0  --  86.9 87.4  --   ?PLT 221   < > 201  --  183 157  --   ? < > = values in this interval not displayed.  ? ?A/P ?Naeem Quillin is a/an 71 y.o. male with a past medical history notable for renal failure (transplant candidate; left brachiocephalic AV fistula created 81/82/99 ), diastolic CHF, HTN, B7JI.   ?  ?Acute hypoxic respiratory failure ?Hypervolemia 2/2 AoC CKD5, worsening  c/f Uremia ?H/o CHF ?-now ESRD ?-started HD#1 via his functional LUE AVF on 3/23 due to concerns for uremia and inadequate diuresis with high dose lasix. S/p #3 treatment 3/25. Next treatment tomorrow, will maintain MWF schedule for now ?-CLIP for outpatient placement ?-high risk for intubation esp w/ bipap dependency-per  PCCM ?-Continue to monitor daily Cr, Dose meds for GFR<15 ?-Maintain MAP>65 for optimal renal perfusion.   ?-Respiratory management per CCM ?-Avoid nephrotoxic medications including NSAIDs and iodinated intravenous contrast exposure unless the latter is absolutely indicated.  Preferred narcotic agents for pain control are hydromorphone, fentanyl, and methadone. Morphine should not be used. Avoid Baclofen and avoid oral sodium phosphate and magnesium citrate based laxatives / bowel preps. Continue strict Input and Output monitoring. Will monitor the patient closely with you and intervene or adjust therapy as indicated by changes in clinical status/labs  ? ?Hypertension, stable ?-uf as tolerated ? ?Rt pleural effusion ?-s/p thora 3/22>transudative ?  ?Anemia due to chronic disease, stable ?-Transfuse for Hgb<7 g/dL ? ?Anemia of CKD ?-transfuse PRN for hgb <7 ?-hgb currently at goal, not on ESA yet ? ?CKD-MBD ?-increased phoslo to 2 tabs tidac, c/w calcitriol. Renal diet ?  ?T2DM ?-manage per primary team ? ?Confusion-  ?-uremia vs other causes? C/w HD, will see if this helps clear up his mentation ? ?Gean Quint, MD ?[facility] ? ?

## 2021-07-28 NOTE — Plan of Care (Signed)
?  Problem: Education: ?Goal: Ability to demonstrate management of disease process will improve ?Outcome: Progressing ?Goal: Ability to verbalize understanding of medication therapies will improve ?Outcome: Progressing ?  ?Problem: Activity: ?Goal: Capacity to carry out activities will improve ?Outcome: Not Progressing ?  ?Problem: Education: ?Goal: Knowledge of General Education information will improve ?Description: Including pain rating scale, medication(s)/side effects and non-pharmacologic comfort measures ?Outcome: Progressing ?  ?Problem: Health Behavior/Discharge Planning: ?Goal: Ability to manage health-related needs will improve ?Outcome: Progressing ?  ?Problem: Clinical Measurements: ?Goal: Ability to maintain clinical measurements within normal limits will improve ?Outcome: Progressing ?  ?

## 2021-07-28 NOTE — Evaluation (Signed)
Clinical/Bedside Swallow Evaluation ?Patient Details  ?Name: Melvin Mcclure ?MRN: [id number] ?Date of Birth: [birth year] ? ?Today's Date: 07/28/2021 ?Time: SLP Start Time (ACUTE ONLY): 1120 SLP Stop Time (ACUTE ONLY): 9735 ?SLP Time Calculation (min) (ACUTE ONLY): 18 min ? ?Past Medical History:  ?Past Medical History:  ?Diagnosis Date  ? Anemia in chronic kidney disease (CKD)   ? stage 5  ? Anxiety   ? Arthritis   ? CHF (congestive heart failure) ([place]) 05/28/2018  ? dx 05/28/18  ? Diabetes mellitus without complication ([place])   ? Hyperlipidemia   ? Hypertension   ? Iron deficiency anemia   ? Neuromuscular disorder ([place])   ? neuropathy feet/hands  ? Perforating neurotrophic ulcer of foot ([place])   ? Stage 4 chronic kidney disease ([place])   ? ?Past Surgical History:  ?Past Surgical History:  ?Procedure Laterality Date  ? ACHILLES TENDON REPAIR Bilateral   ? AV FISTULA PLACEMENT Left 03/21/2021  ? Procedure: LEFT BRACHIOCEPHALIC ARTERIOVENOUS (AV) FISTULA CREATION;  Surgeon: Cherre Robins, MD;  Location: [facility];  Service: Vascular;  Laterality: Left;  ? COLONOSCOPY    ? CYST EXCISION    ? lipoma removed from arm  ? IR THORACENTESIS ASP PLEURAL SPACE W/IMG GUIDE  12/26/2019  ? IR THORACENTESIS ASP PLEURAL SPACE W/IMG GUIDE  01/18/2020  ? THORACENTESIS  04/16/2020  ? ?HPI:  ?Pt is a 71 yo male presenting with worsening shortness of breath (on 2L O2 at home), admitted with acute hypoxemic and hypercapneic respiratory failure requiring intermittent BiPAP. Previous swallow eval in June 2021 with oropharyngeal swallow that appeared to be [facility]. PMH includes: CKD 5, HFpEF, type 2 diabetes, strongyloidiasis infection status post ivermectin treatment  ?  ?Assessment / Plan / Recommendation  ?Clinical Impression ? Pt is sleepy with delayed responses and command following at times, with the appearance of bolus preparation and oral transit as well. Despite this, his swallowing still appears to be relatively functional and he has no overt  s/s of aspiration. He and his wife deny any difficulties acutely or at baseline. Education was provided about potential for increased risk of aspiration in the setting of altered mentation and reduced respiratory status, encouraging careful use of aspiration precautions. With that in mind, SLP will f/u briefly as well, but will leave current diet tof regular solids and thin liquids in place. ?SLP Visit Diagnosis: Dysphagia, unspecified (R13.10) ?   ?Aspiration Risk ? Mild aspiration risk  ?  ?Diet Recommendation Regular;Thin liquid  ? ?Liquid Administration via: Cup;Straw ?Medication Administration: Whole meds with liquid ?Supervision: Staff to assist with self feeding ?Compensations: Minimize environmental distractions;Slow rate;Small sips/bites ?Postural Changes: Seated upright at 90 degrees  ?  ?Other  Recommendations Oral Care Recommendations: Oral care BID   ? ?Recommendations for follow up therapy are one component of a multi-disciplinary discharge planning process, led by the attending physician.  Recommendations may be updated based on patient status, additional functional criteria and insurance authorization. ? ?Follow up Recommendations No SLP follow up  ? ? ?  ?Assistance Recommended at Discharge Intermittent Supervision/Assistance  ?Functional Status Assessment Patient has had a recent decline in their functional status and demonstrates the ability to make significant improvements in function in a reasonable and predictable amount of time.  ?Frequency and Duration min 2x/week  ?1 week ?  ?   ? ?Prognosis Prognosis for Safe Diet Advancement: Good  ? ?  ? ?Swallow Study   ?General HPI: Pt is a 71 yo male presenting with worsening shortness  of breath (on 2L O2 at home), admitted with acute hypoxemic and hypercapneic respiratory failure requiring intermittent BiPAP. Previous swallow eval in June 2021 with oropharyngeal swallow that appeared to be [facility]. PMH includes: CKD 5, HFpEF, type 2 diabetes,  strongyloidiasis infection status post ivermectin treatment ?Type of Study: Bedside Swallow Evaluation ?Previous Swallow Assessment: see HPI ?Diet Prior to this Study: Regular;Thin liquids ?Temperature Spikes Noted: No ?Respiratory Status: Nasal cannula ?History of Recent Intubation: No ?Behavior/Cognition: Lethargic/Drowsy;Cooperative;Requires cueing ?Oral Cavity Assessment: Within Functional Limits ?Oral Care Completed by SLP: No ?Oral Cavity - Dentition: Adequate natural dentition;Missing dentition (was scheduled to have teeth pulled and dentures made but had to cancel because of breathing difficulties) ?Vision: Functional for self-feeding ?Self-Feeding Abilities: Needs assist ?Patient Positioning: Upright in bed ?Baseline Vocal Quality: Normal ?Volitional Cough: Weak ?Volitional Swallow: Unable to elicit  ?  ?Oral/Motor/Sensory Function Overall Oral Motor/Sensory Function: Within functional limits   ?Ice Chips Ice chips: Not tested   ?Thin Liquid Thin Liquid: Within functional limits ?Presentation: Straw  ?  ?Nectar Thick Nectar Thick Liquid: Not tested   ?Honey Thick Honey Thick Liquid: Not tested   ?Puree Puree: Impaired ?Presentation: Spoon ?Oral Phase Functional Implications: Prolonged oral transit   ?Solid ? ? ?  Solid: Impaired ?Oral Phase Functional Implications: Prolonged oral transit  ? ?  ? ?Melvin Mcclure., M.A. CCC-SLP ?Acute Rehabilitation Services ?Pager [phone] ?Office [phone] ? ?07/28/2021,12:57 PM ? ? ? ?

## 2021-07-28 NOTE — Progress Notes (Signed)
? ?NAME:  Melvin Mcclure, MRN:  [id number], DOB:  [birth year], LOS: 5 ?ADMISSION DATE:  07/22/2021, CONSULTATION DATE:  07/22/21 ?REFERRING MD:  Dr. Lorin Mercy, CHIEF COMPLAINT:  Recurrent pleural effusion ? ?History of Present Illness:  ?71 year old gentleman with past medical history of CKD 5, HFpEF, type 2 diabetes, strongyloidiasis infection status post ivermectin treatment, who presents to the hospital for worsening shortness of breath 3-4 days ago.  He is on 2 L of oxygen at home.  He denies coughing, sputum production or wheezing.  Denies fever, night sweat or weight loss.  Reports compliance to diuretics but urine output has been decreased.  He quit smoking in 1960s. ? ?Patient have history of recurrent right pleural effusion.  Has had thoracentesis in 12/2019, 04/2020 and most recently 05/24/2021.  Lights criteria for all of them were transudative. ? ?Pertinent  Medical History  ? ?Past Medical History:  ?Diagnosis Date  ? Anemia in chronic kidney disease (CKD)   ? stage 5  ? Anxiety   ? Arthritis   ? CHF (congestive heart failure) ([place]) 05/28/2018  ? dx 05/28/18  ? Diabetes mellitus without complication ([place])   ? Hyperlipidemia   ? Hypertension   ? Iron deficiency anemia   ? Neuromuscular disorder ([place])   ? neuropathy feet/hands  ? Perforating neurotrophic ulcer of foot ([place])   ? Stage 4 chronic kidney disease ([place])   ?  ? ?Significant Hospital Events: ?Including procedures, antibiotic start and stop dates in addition to other pertinent events   ?3/20 admitted, PCCM consult ?3/21 thoracentesis ?3/22 bronchoscopy w/ RLL TBBx for 3 year nonresolving infiltrate ?3/23 iHD initiation ?3/24 onwards, BIPAP wean attempts ? ?Interim History / Subjective:  ?Still needs BIPAP intermittently during day due to CO2 retention. ? ?Objective   ?Blood pressure (!) 142/72, pulse 72, temperature 99.9 ?F (37.7 ?C), temperature source Axillary, resp. rate 12, height '5\' 10"'$  (1.778 m), weight 89.2 kg, SpO2 97 %. ?   ?Vent Mode: BIPAP ?FiO2  (%):  [50 %] 50 % ?Set Rate:  [10 bmp] 10 bmp ?PEEP:  [5 cmH20] 5 cmH20 ?Pressure Support:  [12 cmH20] 12 cmH20 ?Plateau Pressure:  [14 cmH20] 14 cmH20  ? ?Intake/Output Summary (Last 24 hours) at 07/28/2021 0829 ?Last data filed at 07/28/2021 0000 ?Gross per 24 hour  ?Intake 460 ml  ?Output 3500 ml  ?Net -3040 ml  ? ? ?Filed Weights  ? 07/26/21 0500 07/27/21 0145 07/28/21 0500  ?Weight: 95.7 kg 95.8 kg 89.2 kg  ? ? ?Examination: ?No distress on BIPAP ?MMM, trachea midline ?Lungs clear, no accessory muscle use ?Abdomen soft, +BS ?Improving edema ? ?No new BMP or chest imaging ? ?Resolved Hospital Problem list   ? ? ?Assessment & Plan:  ?Acute hypoxemic and hypercapneic respiratory failure- volume overload, uremia being proximal cause.  Improving mentation and volume status. ?Chronic bibasilar infiltrates- bronch/biopsy reassuring against malignancy or smoldering infection but final cultures will take weeks to come back. ?DM2, HTN, HFpEF ? ?- BIPAP qHS and PRN during day (becomes extremely somnolent followed by hypoxemia, this limits our ability to transfer out of ICU) ?- f/u remaining bronch labs, AFB and fungal will take a while to fully result ?- f/u inpatient rehab MD recs; will need to wean to BIPAP qHS only before he is stable for transfer ?- Continue to push fluid removal with iHD ? ?Best Practice (right click and "Reselect all SmartList Selections" daily)  ? ?Diet/type: renal ?DVT prophylaxis: heparin ?GI prophylaxis: NA ?Lines: NA ?Foley:  NA ?  Code Status:  full code ?Last date of multidisciplinary goals of care discussion [full scope, wife updated daily] ? ?Patient critically ill due to hypoxemic and hypercarbic resp failure ?Interventions to address this today bipap titration, intubation watch ?Risk of deterioration without these interventions is high ? ?I personally spent 33 minutes providing critical care not including any separately billable procedures ? ?Erskine Emery MD ?[facility] Pulmonary Critical  Care ? ?Prefer epic messenger for cross cover needs ?If after hours, please call E-link ? ?  ?

## 2021-07-28 NOTE — Progress Notes (Signed)
IP rehab admissions - per therapy recommendations, patient screened for potential acute inpatient rehab admission.  Appears to be a candidate for inpatient rehab consideration.  I will place a rehab consult per protocol.  Call for questions.  [phone] ?

## 2021-07-29 DIAGNOSIS — J9621 Acute and chronic respiratory failure with hypoxia: Secondary | ICD-10-CM | POA: Diagnosis not present

## 2021-07-29 LAB — RENAL FUNCTION PANEL
Albumin: 2.8 g/dL — ABNORMAL LOW (ref 3.5–5.0)
Anion gap: 11 (ref 5–15)
BUN: 67 mg/dL — ABNORMAL HIGH (ref 8–23)
CO2: 25 mmol/L (ref 22–32)
Calcium: 7.1 mg/dL — ABNORMAL LOW (ref 8.9–10.3)
Chloride: 99 mmol/L (ref 98–111)
Creatinine, Ser: 6.09 mg/dL — ABNORMAL HIGH (ref 0.61–1.24)
GFR, Estimated: 9 mL/min — ABNORMAL LOW (ref >60)
Glucose, Bld: 99 mg/dL (ref 70–99)
Phosphorus: 4.3 mg/dL (ref 2.5–4.6)
Potassium: 4 mmol/L (ref 3.5–5.1)
Sodium: 135 mmol/L (ref 135–145)

## 2021-07-29 LAB — GLUCOSE, CAPILLARY
Glucose-Capillary: 106 mg/dL — ABNORMAL HIGH (ref 70–99)
Glucose-Capillary: 105 mg/dL — ABNORMAL HIGH (ref 70–99)
Glucose-Capillary: 125 mg/dL — ABNORMAL HIGH (ref 70–99)
Glucose-Capillary: 126 mg/dL — ABNORMAL HIGH (ref 70–99)

## 2021-07-29 LAB — CBC
HCT: 35.2 % — ABNORMAL LOW (ref 39.0–52.0)
Hemoglobin: 11.2 g/dL — ABNORMAL LOW (ref 13.0–17.0)
MCH: 27.7 pg (ref 26.0–34.0)
MCHC: 31.8 g/dL (ref 30.0–36.0)
MCV: 86.9 fL (ref 80.0–100.0)
Platelets: 178 10*3/uL (ref 150–400)
RBC: 4.05 MIL/uL — ABNORMAL LOW (ref 4.22–5.81)
RDW: 15 % (ref 11.5–15.5)
WBC: 9.9 10*3/uL (ref 4.0–10.5)
nRBC: 0 % (ref 0.0–0.2)

## 2021-07-29 MED ORDER — HALOPERIDOL LACTATE 5 MG/ML IJ SOLN
5.0000 mg | Freq: Four times a day (QID) | INTRAMUSCULAR | Status: DC | PRN
  Administered 2021-07-29 – 2021-07-31 (×2): 5 mg via INTRAVENOUS
  Filled 2021-07-29 (×2): qty 1

## 2021-07-29 MED ORDER — AMOXICILLIN-POT CLAVULANATE 500-125 MG PO TABS
1.0000 | ORAL_TABLET | Freq: Every day | ORAL | Status: AC
  Administered 2021-07-29 – 2021-08-04 (×7): 500 mg via ORAL
  Filled 2021-07-29 (×8): qty 1

## 2021-07-29 NOTE — Progress Notes (Signed)
PT Cancellation Note ? ?Patient Details ?Name: Melvin Mcclure ?MRN: [id number] ?DOB: 20-Aug-1950 ? ? ?Cancelled Treatment:    Reason Eval/Treat Not Completed: (P) Patient at procedure or test/unavailable ? ?Pt receiving HD. PT will follow back this afternoon for treatment as able. ? ?Alexsandria Kivett B. Migdalia Dk PT, DPT ?Acute Rehabilitation Services ?Pager [phone] ?Office [phone] ? ? ?[employer] ?07/29/2021, 9:19 AM ? ? ?

## 2021-07-29 NOTE — Progress Notes (Addendum)
? ?NAME:  Melvin Mcclure, MRN:  [id number], DOB:  08-06-1950, LOS: 6 ?ADMISSION DATE:  07/22/2021, CONSULTATION DATE:  07/22/21 ?REFERRING MD:  Dr. Lorin Mercy, CHIEF COMPLAINT:  Recurrent pleural effusion ? ?History of Present Illness:  ?71 year old gentleman with past medical history of CKD 5, HFpEF, type 2 diabetes, strongyloidiasis infection status post ivermectin treatment, who presents to the hospital for worsening shortness of breath 3-4 days ago.  He is on 2 L of oxygen at home.  He denies coughing, sputum production or wheezing.  Denies fever, night sweat or weight loss.  Reports compliance to diuretics but urine output has been decreased.  He quit smoking in 1960s. ? ?Patient have history of recurrent right pleural effusion.  Has had thoracentesis in 12/2019, 04/2020 and most recently 05/24/2021.  Lights criteria for all of them were transudative. ? ?Pertinent  Medical History  ? ?Past Medical History:  ?Diagnosis Date  ? Anemia in chronic kidney disease (CKD)   ? stage 5  ? Anxiety   ? Arthritis   ? CHF (congestive heart failure) ([place]) 05/28/2018  ? dx 05/28/18  ? Diabetes mellitus without complication ([place])   ? Hyperlipidemia   ? Hypertension   ? Iron deficiency anemia   ? Neuromuscular disorder ([place])   ? neuropathy feet/hands  ? Perforating neurotrophic ulcer of foot ([place])   ? Stage 4 chronic kidney disease ([place])   ?  ? ?Significant Hospital Events: ?Including procedures, antibiotic start and stop dates in addition to other pertinent events   ?3/20 admitted, PCCM consult ?3/21 thoracentesis ?3/22 bronchoscopy w/ RLL TBBx for 3 year nonresolving infiltrate ?3/23 iHD initiation ?3/24 onwards, BIPAP wean attempts ? ?Interim History / Subjective:  ?Slight decline in mentation; patient was confused this morning. ?Objective   ?Blood pressure (!) 151/77, pulse 79, temperature 98.6 ?F (37 ?C), temperature source Axillary, resp. rate (!) 24, height '5\' 10"'$  (1.778 m), weight 89.7 kg, SpO2 93 %. ?   ?Vent Mode:  BIPAP;PCV ?FiO2 (%):  [50 %] 50 % ?Set Rate:  [10 bmp] 10 bmp ?PEEP:  [5 cmH20] 5 cmH20  ? ?Intake/Output Summary (Last 24 hours) at 07/29/2021 0942 ?Last data filed at 07/29/2021 0100 ?Gross per 24 hour  ?Intake 600 ml  ?Output 225 ml  ?Net 375 ml  ? ?Filed Weights  ? 07/28/21 0500 07/29/21 0500 07/29/21 0730  ?Weight: 89.2 kg 89.6 kg 89.7 kg  ? ? ?Examination: ?No distress on HFNC ?MMM, trachea midline ?Lungs clear, no accessory muscle use ?Abdomen soft, +BS ?Improving edema ? ?No new BMP or chest imaging ? ?Resolved Hospital Problem list   ? ? ?Assessment & Plan:  ?Acute hypoxemic and hypercapneic respiratory failure- improving volume status, appears euvolemic on exam; uremia being proximal cause.  Mentation stagnant, with continued intermittent confusion. ?-Continue BiPAP at bedtime; HFNC during the day ?- Hospital delirium vs acute hypoxia vs uremia ?cause of confusion ?- Now ESRD, HD MWF ?- Continue to push fluid removal with iHD ?- Resp clx- fungal stain no organisms seen; fungal clx pending ?-BLA positive for Prevotella; Augmentin started, end date 08/05/21 ?- f/u inpatient rehab MD recs; will need to wean to BIPAP qHS only before he is stable for transfer ?-discontinue centrally acting agents: gabapentin and hydroxyzine  ? ?Chronic bibasilar infiltrates- bronch/biopsy reassuring against malignancy or smoldering infection but final cultures will take weeks to come back. ?DM2, HTN, HFpEF ? ?-glucose level at goal; continue current regimen ?- BP stable ?-volume status continues to improve ? ? ? ?Best Practice (  right click and "Reselect all SmartList Selections" daily)  ? ?Diet/type: renal ?DVT prophylaxis: heparin ?GI prophylaxis: NA ?Lines: NA ?Foley:  NA ?Code Status:  full code ?Last date of multidisciplinary goals of care discussion [full scope, wife updated daily] ? ?Patient critically ill due to hypoxemic and hypercarbic resp failure ?Interventions to address this today bipap titration, intubation  watch ?Risk of deterioration without these interventions is high ? ?I personally spent 33 minutes providing critical care not including any separately billable procedures ? ?Erskine Emery MD ?[facility] Pulmonary Critical Care ? ?Prefer epic messenger for cross cover needs ?If after hours, please call E-link ? ?  ?

## 2021-07-29 NOTE — Progress Notes (Signed)
Speech Language Pathology Treatment: Dysphagia  ?Patient Details ?Name: Melvin Mcclure ?MRN: [id number] ?DOB: [birth year] ?Today's Date: 07/29/2021 ?Time: 1607-3710 ?SLP Time Calculation (min) (ACUTE ONLY): 10 min ? ?Assessment / Plan / Recommendation ?Clinical Impression ? Pt with lunch on tray when SLP arrived with sister-in-law present. He needed verbal direction and encouragement given deconditioning, obvious fatigue and difficulty expressing himself this session. He was only agreeable to several bites chicken and rice and did have a sudden cough after second bite concerning for possible airway intrusion (he may have attempted to phonate). Trials of straw sips thin water and ginger ale consumed without s/sx aspiration. Therapist will plan to continue to see with increased amount of po's. Continue regular/thin, sit upright, pills whole in puree.  ?  ?HPI HPI: Pt is a 71 yo male presenting with worsening shortness of breath (on 2L O2 at home), admitted with acute hypoxemic and hypercapneic respiratory failure requiring intermittent BiPAP. Previous swallow eval in June 2021 with oropharyngeal swallow that appeared to be [facility]. PMH includes: CKD 5, HFpEF, type 2 diabetes, strongyloidiasis infection status post ivermectin treatment ?  ?   ?SLP Plan ? Continue with current plan of care ? ?  ?  ?Recommendations for follow up therapy are one component of a multi-disciplinary discharge planning process, led by the attending physician.  Recommendations may be updated based on patient status, additional functional criteria and insurance authorization. ?  ? ?Recommendations  ?Diet recommendations: Regular;Thin liquid ?Liquids provided via: Straw;Cup ?Medication Administration: Whole meds with puree ?Supervision: Staff to assist with self feeding ?Compensations: Minimize environmental distractions;Slow rate;Small sips/bites ?Postural Changes and/or Swallow Maneuvers: Seated upright 90 degrees  ?   ?    ?   ? ? ? ? Oral Care  Recommendations: Oral care BID ?Follow Up Recommendations: No SLP follow up ?Assistance recommended at discharge: Intermittent Supervision/Assistance ?SLP Visit Diagnosis: Dysphagia, unspecified (R13.10) ?Plan: Continue with current plan of care ? ? ? ? ?  ?  ? ? ?Melvin Mcclure ? ?07/29/2021, 1:46 PM ?

## 2021-07-29 NOTE — Evaluation (Signed)
Occupational Therapy Evaluation ?Patient Details ?Name: Melvin Mcclure ?MRN: [id number] ?DOB: [birth year] ?Today's Date: 07/29/2021 ? ? ?History of Present Illness 71 year old gentleman admitted 3/20 who presents to the hospital for worsening shortness of breath.  Hx of pleural effusion. Pt underwent thoracentesis on 3/21.  On 3/22 had bronchoscopy w/ RLL.  On 3/23 began Intermittent HD. Has been on Bipap and was on 15L HFNC on PT evaluation on 3/25.  PMH:  CKD 5, HFpEF, type 2 diabetes, strongyloidiasis infection status post ivermectin treatment  ? ?Clinical Impression ?  ?PTA, pt lives with spouse, typically ambulatory without AD and Independent in ADLs/IADLs (yard work, Social research officer, government). Pt presents now with significant deficits in cognition, sitting balance, and endurance. Pt with inconsistent following of commands, often with tendencies to push back against cues to lean forward when sitting EOB. Overall, pt requires Total A x 2 for bed mobility, unable to appropriately follow commands for standing attempts today. Pt requires Max-Total A for ADLs bed level, including self feeding at this time. Pending improvements in activity tolerance and cognition, recommend AIR level therapies as pt is significantly below functional baseline.  ?   ? ?Recommendations for follow up therapy are one component of a multi-disciplinary discharge planning process, led by the attending physician.  Recommendations may be updated based on patient status, additional functional criteria and insurance authorization.  ? ?Follow Up Recommendations ? Acute inpatient rehab (3hours/day) (pending improvements in activity tolerance)  ?  ?Assistance Recommended at Discharge Frequent or constant Supervision/Assistance  ?Patient can return home with the following Two people to help with walking and/or transfers;Two people to help with bathing/dressing/bathroom;Assistance with feeding ? ?  ?Functional Status Assessment ? Patient has had a recent decline in their  functional status and demonstrates the ability to make significant improvements in function in a reasonable and predictable amount of time.  ?Equipment Recommendations ? Other (comment) (TBD pending progress)  ?  ?Recommendations for Other Services   ? ? ?  ?Precautions / Restrictions Precautions ?Precautions: Fall ?Precaution Comments: monitor O2 ?Restrictions ?Weight Bearing Restrictions: No  ? ?  ? ?Mobility Bed Mobility ?Overal bed mobility: Needs Assistance ?Bed Mobility: Supine to Sit, Sit to Supine, Rolling ?Rolling: Total assist, +2 for physical assistance, +2 for safety/equipment ?  ?Supine to sit: Total assist, +2 for physical assistance, +2 for safety/equipment ?Sit to supine: Total assist, +2 for physical assistance, +2 for safety/equipment ?  ?General bed mobility comments: despite multimodal cues, did not initiate bed mobility requiring Total A x 2 for all bed mobility; resistant at times ?  ? ?Transfers ?Overall transfer level: Needs assistance ?  ?  ?  ?  ?  ?  ?  ?  ?General transfer comment: Attempted to engage in standing with RW and without. Despite +2-3 assist, pt with minimal activation to attempt standing. unable to progress OOB ?  ? ?  ?Balance Overall balance assessment: Needs assistance ?Sitting-balance support: Bilateral upper extremity supported, No upper extremity supported, Feet supported ?Sitting balance-Leahy Scale: Poor ?Sitting balance - Comments: posterior lean; at times, leaning to prop on R elbow ?Postural control: Posterior lean, Right lateral lean ?  ?  ?  ?  ?  ?  ?  ?  ?  ?  ?  ?  ?  ?  ?   ? ?ADL either performed or assessed with clinical judgement  ? ?ADL Overall ADL's : Needs assistance/impaired ?Eating/Feeding: Maximal assistance;Bed level ?Eating/Feeding Details (indicate cue type and reason): able to hold fork, unable  to sequence or follow commands to bring fork to mouth without assist; noted decreased inattention with pt demo risk of poking self in face with utensil -  switched to spoon for safety ?Grooming: Maximal assistance;Bed level ?  ?Upper Body Bathing: Total assistance;Sitting ?  ?Lower Body Bathing: Total assistance;Bed level ?  ?Upper Body Dressing : Total assistance;Sitting ?  ?Lower Body Dressing: Total assistance;Bed level ?  ?  ?  ?Toileting- Clothing Manipulation and Hygiene: Total assistance;Bed level ?  ?  ?  ?  ?General ADL Comments: Pt with significant cognitive deficits, posterior lean sititng EOB and difficulty following directions to attempt progressing OOB  ? ? ? ?Vision Ability to See in Adequate Light: 1 Impaired ?Patient Visual Report: No change from baseline ?Vision Assessment?: No apparent visual deficits  ?   ?Perception   ?  ?Praxis   ?  ? ?Pertinent Vitals/Pain Pain Assessment ?Pain Assessment: Faces ?Faces Pain Scale: No hurt  ? ? ? ?Hand Dominance Right ?  ?Extremity/Trunk Assessment Upper Extremity Assessment ?Upper Extremity Assessment: Difficult to assess due to impaired cognition;RUE deficits/detail ?RUE Deficits / Details: initially appeared as if MCPs were contracted and attempting to remove pulse ox. once probe removed, able to stretch digits to full extension and use hand normally ?  ?Lower Extremity Assessment ?Lower Extremity Assessment: Defer to PT evaluation ?  ?Cervical / Trunk Assessment ?Cervical / Trunk Assessment: Normal ?  ?Communication Communication ?Communication: HOH ?  ?Cognition Arousal/Alertness: Awake/alert, Lethargic ?Behavior During Therapy: Flat affect ?Overall Cognitive Status: Impaired/Different from baseline ?Area of Impairment: Orientation, Attention, Memory, Following commands, Safety/judgement, Awareness, Problem solving ?  ?  ?  ?  ?  ?  ?  ?  ?Orientation Level: Disoriented to, Place, Time, Situation ?Current Attention Level: Focused ?Memory: Decreased recall of precautions, Decreased short-term memory ?Following Commands: Follows one step commands inconsistently, Follows one step commands with increased  time ?Safety/Judgement: Decreased awareness of safety, Decreased awareness of deficits ?Awareness: Intellectual ?Problem Solving: Slow processing, Decreased initiation, Difficulty sequencing, Requires verbal cues, Requires tactile cues ?General Comments: Delayed response time needing repetition of commands; inconsistent following of commands; pulling at pulse ox probe on finger; confused, garbled speech; often saying "no" ?  ?  ?General Comments  Due to pt pulling off pulse ox probe, unable to get O2 sats reading. HR stable. Pt's sister in law present, assisting to encourage pt to attempt participating. Noted maxisky lift missing remote control; unsure if working and unable to locate [employer] pad in closet so deferred transfer OOB for safety ? ?  ?Exercises   ?  ?Shoulder Instructions    ? ? ?Home Living Family/patient expects to be discharged to:: Private residence ?Living Arrangements: Spouse/significant other ?Available Help at Discharge: Available PRN/intermittently (wife is a Marine scientist and works 12 hour shifts) ?Type of Home: House ?Home Access: Stairs to enter ?Entrance Stairs-Number of Steps: 2 ?Entrance Stairs-Rails: None ?Home Layout: Two level;Bed/bath upstairs;1/2 bath on main level (may move him downstairs to a bedroom there) ?Alternate Level Stairs-Number of Steps: 15 with landing ?Alternate Level Stairs-Rails: Right ?Bathroom Shower/Tub: Tub/shower unit ?  ?Bathroom Toilet: Standard ?  ?  ?Home Equipment: Cane - single Barista (2 wheels) ?  ?  ?  ? ?  ?Prior Functioning/Environment Prior Level of Function : Independent/Modified Independent;Driving;Patient poor historian/Family not available ?  ?  ?  ?  ?  ?  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 Pt poor historian and unable to report PLOF ?  ? ?  ?  ?OT Problem List: Decreased strength;Decreased activity tolerance;Impaired balance (sitting and/or  standing);Decreased coordination;Decreased cognition;Decreased safety awareness;Decreased knowledge of use of DME or AE;Decreased knowledge of precautions;Cardiopulmonary status limiting activity ?  ?   ?OT Treatment

## 2021-07-29 NOTE — Progress Notes (Signed)
Inpatient Rehab Admissions Coordinator:  ? ?I went by pt.'s room to discuss potential CIR admit. Pt. Disoriented, unable to answer most questions. I will reach out to Pt.'s spouse for more information. ? ?Clemens Catholic, MS, CCC-SLP ?Rehab Admissions Coordinator  ?[phone] (celll) ?[phone] (office) ? ?

## 2021-07-29 NOTE — Progress Notes (Signed)
OT Cancellation Note ? ?Patient Details ?Name: Melvin Mcclure ?MRN: [id number] ?DOB: 05-18-1950 ? ? ?Cancelled Treatment:    Reason Eval/Treat Not Completed: Patient at procedure or test/ unavailable Pt receiving HD this AM. Will follow-up in PM as schedule permits for OT eval. ? ?Layla Maw ?07/29/2021, 9:33 AM ?

## 2021-07-29 NOTE — Progress Notes (Signed)
RT note: Patient continues to refuse BIPAP QHS stating it's tortures him. Alert and oriented at this time. ?

## 2021-07-29 NOTE — Procedures (Signed)
Patient was seen on dialysis and the procedure was supervised.  BFR 300 via 16 gauge needles  Via AVF BP is  135/86. ? ? Patient appears to be tolerating treatment well ? ?Melvin Mcclure ?07/29/2021 ? ?

## 2021-07-29 NOTE — Progress Notes (Signed)
Inpatient Rehab Admissions Coordinator:  ? ?CIR consult received. Currently, patient appears unable to tolerate intensity of CIR (he is total A+2-3 and not able to progress to OOB today). Pt. Does demonstrate potential to progress to being an appropriate candidate so I will follow for progress and participation with therapies.  ? ?Clemens Catholic, MS, CCC-SLP ?Rehab Admissions Coordinator  ?[phone] (celll) ?[phone] (office) ? ?

## 2021-07-29 NOTE — Progress Notes (Addendum)
Admit: 07/22/2021 ?LOS: 6 ? ?Melvin Mcclure is a/an 71 y.o. male with a past medical history notable for renal failure (transplant candidate; left brachiocephalic AV fistula created 37/85/88 ), diastolic CHF, HTN, F0YD who presents with acute hypoxic respiratory failure.    ? ?Subjective:  ?States he is not doing well and wishes he were home. No acute events overnight. MS overall improved from Friday-  but still saying some bizarre things every once and a while  ? ?03/26 0701 - 03/27 0700 ?In: 600 [P.O.:600] ?Out: 225 [Urine:225] ? ?Filed Weights  ? 07/27/21 0145 07/28/21 0500 07/29/21 0500  ?Weight: 95.8 kg 89.2 kg 89.6 kg  ? ? ?Scheduled Meds: ? amLODipine  5 mg Oral Daily  ? aspirin  81 mg Oral Daily  ? calcitRIOL  0.25 mcg Oral QODAY  ? calcium acetate  1,334 mg Oral TID WC  ? calcium carbonate  2 tablet Oral QHS  ? Chlorhexidine Gluconate Cloth  6 each Topical Daily  ? docusate sodium  100 mg Oral BID  ? feeding supplement (NEPRO CARB STEADY)  237 mL Oral BID BM  ? gabapentin  100 mg Oral Daily  ? heparin  5,000 Units Subcutaneous Q8H  ? hydrALAZINE  50 mg Oral TID  ? insulin aspart  0-5 Units Subcutaneous QHS  ? insulin aspart  0-6 Units Subcutaneous TID WC  ? insulin glargine-yfgn  20 Units Subcutaneous Daily  ? isosorbide mononitrate  60 mg Oral QPM  ? meclizine  25 mg Oral BID  ? mouth rinse  15 mL Mouth Rinse BID  ? polyethylene glycol  17 g Oral Daily  ? senna-docusate  1 tablet Oral QHS  ? sodium chloride flush  3 mL Intravenous Q12H  ? ?Continuous Infusions: ? sodium chloride Stopped (07/24/21 2238)  ? sodium chloride    ? sodium chloride    ? ?PRN Meds:.sodium chloride, sodium chloride, sodium chloride, acetaminophen **OR** acetaminophen, albuterol, alteplase, calcium carbonate (dosed in mg elemental calcium), docusate sodium, fentaNYL (SUBLIMAZE) injection, heparin, hydrALAZINE, HYDROcodone-acetaminophen, hydrOXYzine, lidocaine (PF), lidocaine-prilocaine, ondansetron **OR** ondansetron (ZOFRAN) IV,  pentafluoroprop-tetrafluoroeth, phenol, senna-docusate, sorbitol, zolpidem ? ? ?Physical Exam:  Blood pressure (!) 156/81, pulse (!) 190, temperature 99.5 ?F (37.5 ?C), temperature source Axillary, resp. rate 19, height '5\' 10"'$  (1.778 m), weight 89.6 kg, SpO2 100 %. ?General: Chronically ill, no acute distress. Age appropriate. ?Cardiac: RRR, normal heart sounds, no murmurs ?Respiratory: CTAB, normal effort on HFNC ?Abdomen: soft, nontender, nondistended ?Extremities: No edema or cyanosis.  Some edema ?Neuro: alert and oriented x4  ?Psych: normal affect ?Dialysis access: LUE AVF +b/t ? ?Recent Labs  ?Lab 07/25/21 ?0720 07/25/21 ?7412 07/26/21 ?0735 07/27/21 ?0830 07/27/21 ?1242  ?NA 134*   < > 134* 133* 138  ?K 4.6   < > 4.8 4.0 3.7  ?CL 91*  --  95* 95*  --   ?CO2 26  --  24 27  --   ?GLUCOSE 230*  --  216* 204*  --   ?BUN 118*  --  109* 81*  --   ?CREATININE 7.93*  --  8.06* 7.07*  --   ?CALCIUM 7.4*  --  7.2* 7.1*  --   ?PHOS 10.7*  --  9.1* 5.6*  --   ? < > = values in this interval not displayed.  ? ? ?Recent Labs  ?Lab 07/25/21 ?0720 07/25/21 ?8786 07/26/21 ?0803 07/27/21 ?0830 07/27/21 ?1242  ?WBC 8.3  --  9.0 8.2  --   ?HGB 10.6*   < >  11.6* 10.4* 12.9*  ?HCT 33.6*   < > 35.7* 32.7* 38.0*  ?MCV 88.0  --  86.9 87.4  --   ?PLT 201  --  183 157  --   ? < > = values in this interval not displayed.  ? ? ?A/P ?Melvin Mcclure is a/an 71 y.o. male with a past medical history notable for renal failure (transplant candidate; left brachiocephalic AV fistula created 48/25/00 ), diastolic CHF, HTN, B7CW.   ?  ?Acute hypoxic respiratory failure ?Hypervolemia 2/2 AoC CKD5, worsening  c/f Uremia ?H/o CHF ?-now ESRD ?-started HD#1 via his functional LUE AVF on 3/23 due to uremia and inadequate diuresis with high dose lasix. S/p #3 treatment 3/25. Next treatment today, will maintain MWF schedule for now ?-CLIP for outpatient placement- have heard will be [facility] TTS but still seems a ways from discharge-  will change his dialysis  schedule eventually  ?-high risk for intubation esp w/ bipap dependency-per PCCM ?-Continue to monitor daily Cr, Dose meds for GFR<15 ?-Maintain MAP>65 for optimal renal perfusion.   ?-Respiratory management per CCM ?-Avoid nephrotoxic medications including NSAIDs and iodinated intravenous contrast exposure unless the latter is absolutely indicated.  Preferred narcotic agents for pain control are hydromorphone, fentanyl, and methadone. Morphine should not be used. Avoid Baclofen and avoid oral sodium phosphate and magnesium citrate based laxatives / bowel preps. Continue strict Input and Output monitoring. Will monitor the patient closely with you and intervene or adjust therapy as indicated by changes in clinical status/labs  ? ?Hypertension, stable ?-uf as tolerated ? ?Rt pleural effusion ?-s/p thora 3/22>transudative ?  ?Anemia due to chronic disease, stable ?-Transfuse for Hgb<7 g/dL ? ?Anemia of CKD ?-transfuse PRN for hgb <7 ?-hgb currently at goal, not on ESA yet ? ?CKD-MBD ?-phoslo to 2 tabs tidac-  most recent phos OK, c/w calcitriol for low calcium. Renal diet. PTH was 81 ?  ?T2DM ?-manage per primary team ? ?Confusion, improving ?-uremia vs other causes? S/p several HD treatments now, he can hold appropriate conversation and oriented to self, person, place, year, and situation.  ? ?Gerlene Fee, DO ?07/29/2021, 8:19 AM ?PGY-3, [place] ? ?Patient seen and examined, agree with above note with above modifications. Pt seems more alert today but still with some bizarre speech-  on HD today -  going for big goal-  tolerating so far.  Has spot at [facility] TTS and can start as early as Thurs-  possibly planning for CIR.  Continue with dialysis related medications  ?Corliss Parish, MD ?07/29/2021 ? ? ? ? ?

## 2021-07-29 NOTE — Progress Notes (Signed)
Pt has been accepted at [facility] on TTS schedule. Pt will need to arrive at 7:00 for 7:20 chair time. Pt's paperwork will need to be completed at the clinic the day before. Pt can start on Thursday. Pt's wife not at bedside. Contacted pt's wife via phone and left a message requesting a return call. Update provided to nephrologist. Will assist as needed.  ? ?Melven Sartorius ?Renal Navigator ?[phone] ?

## 2021-07-29 NOTE — Progress Notes (Signed)
Physical Therapy Treatment ?Patient Details ?Name: Melvin Mcclure ?MRN: [id number] ?DOB: December 30, 1950 ?Today's Date: 07/29/2021 ? ? ?History of Present Illness 71 year old gentleman admitted 3/20 who presents to the hospital for worsening shortness of breath.  Hx of pleural effusion. Pt underwent thoracentesis on 3/21.  On 3/22 had bronchoscopy w/ RLL.  On 3/23 began Intermittent HD. Has been on Bipap and was on 15L HFNC on PT evaluation on 3/25.  PMH:  CKD 5, HFpEF, type 2 diabetes, strongyloidiasis infection status post ivermectin treatment ? ?  ?PT Comments  ? ? Pt has increased confusion today after HD and has difficulty with processing request to get up to recliner to eat his lunch. Pt reports he needs to use the bathroom but does not want to get to [facility]. Provided maximal visual and tactile cues for coming to sit EoB however not able to sequence movement to get there. Pt preoccupied with O2 sensor on his finger, eventually therapy removes probe and patient able to use hand appropriately. Requires total Ax2-3 for coming to the EoB. Is able to sit with min-max outside assist, and actively resists attempts to stand. Ultimately requires totalAx2 for return to bed. Pt placed in chair position and set up for lunch. Pt with increased difficulty with self feeding. RN notified. Given pt prior independence and current level of function, PT continues recommend CIR level rehab pending his cognition clearing. PT will continue to follow acutely. ?   ?Recommendations for follow up therapy are one component of a multi-disciplinary discharge planning process, led by the attending physician.  Recommendations may be updated based on patient status, additional functional criteria and insurance authorization. ? ?Follow Up Recommendations ? Acute inpatient rehab (3hours/day) ?  ?  ?Assistance Recommended at Discharge Set up Supervision/Assistance  ?Patient can return home with the following A little help with walking and/or transfers;A  little help with bathing/dressing/bathroom;Help with stairs or ramp for entrance;Assistance with cooking/housework (if mental status improves) ?  ?Equipment Recommendations ? None recommended by PT  ?  ?Recommendations for Other Services Rehab consult ? ? ?  ?Precautions / Restrictions Precautions ?Precautions: Fall ?Precaution Comments: monitor O2 ?Restrictions ?Weight Bearing Restrictions: No  ?  ? ?Mobility ? Bed Mobility ?Overal bed mobility: Needs Assistance ?Bed Mobility: Supine to Sit, Sit to Supine, Rolling ?Rolling: Total assist, +2 for physical assistance, +2 for safety/equipment ?  ?Supine to sit: Total assist, +2 for physical assistance, +2 for safety/equipment ?Sit to supine: Total assist, +2 for physical assistance, +2 for safety/equipment ?  ?General bed mobility comments: despite multimodal cues, did not initiate bed mobility requiring Total A x 2 for all bed mobility; resistant at times ?  ? ?Transfers ?Overall transfer level: Needs assistance ?  ?  ?  ?  ?  ?  ?  ?  ?General transfer comment: Attempted to engage in standing with RW and without. Despite +2-3 assist, pt with minimal activation to attempt standing. unable to progress OOB ?  ? ? ? ?  ?Balance Overall balance assessment: Needs assistance ?Sitting-balance support: Bilateral upper extremity supported, No upper extremity supported, Feet supported ?Sitting balance-Leahy Scale: Poor ?Sitting balance - Comments: posterior lean; at times, leaning to prop on R elbow ?Postural control: Posterior lean, Right lateral lean ?  ?  ?  ?  ?  ?  ?  ?  ?  ?  ?  ?  ?  ?  ?  ? ?  ?Cognition Arousal/Alertness: Awake/alert, Lethargic ?Behavior During Therapy: Flat affect ?Overall Cognitive  Status: Impaired/Different from baseline ?Area of Impairment: Orientation, Attention, Memory, Following commands, Safety/judgement, Awareness, Problem solving ?  ?  ?  ?  ?  ?  ?  ?  ?Orientation Level: Disoriented to, Place, Time, Situation ?Current Attention Level:  Focused ?Memory: Decreased recall of precautions, Decreased short-term memory ?Following Commands: Follows one step commands inconsistently, Follows one step commands with increased time ?Safety/Judgement: Decreased awareness of safety, Decreased awareness of deficits ?Awareness: Intellectual ?Problem Solving: Slow processing, Decreased initiation, Difficulty sequencing, Requires verbal cues, Requires tactile cues ?General Comments: Delayed response time needing repetition of commands; inconsistent following of commands; pulling at pulse ox probe on finger; confused, garbled speech; often saying "no" ?  ?  ? ?  ?   ?General Comments General comments (skin integrity, edema, etc.): Due to pt pulling off pulse ox probe, unable to get O2 sats reading. HR stable. Pt's sister in law present, assisting to encourage pt to attempt participating. Noted maxisky lift missing remote control; unsure if working and unable to locate [employer] pad in closet so deferred transfer OOB for safety ?  ?  ? ?Pertinent Vitals/Pain Pain Assessment ?Pain Assessment: Faces ?Faces Pain Scale: Hurts a little bit ?Pain Location: general (pt trying to reposition) ?Pain Descriptors / Indicators: Discomfort ?Pain Intervention(s): Limited activity within patient's tolerance, Monitored during session, Repositioned  ? ? ?Home Living Family/patient expects to be discharged to:: Private residence ?Living Arrangements: Spouse/significant other ?Available Help at Discharge: Available PRN/intermittently (wife is a Marine scientist and works 12 hour shifts) ?Type of Home: House ?Home Access: Stairs to enter ?Entrance Stairs-Rails: None ?Entrance Stairs-Number of Steps: 2 ?Alternate Level Stairs-Number of Steps: 15 with landing ?Home Layout: Two level;Bed/bath upstairs;1/2 bath on main level (may move him downstairs to a bedroom there) ?Home Equipment: Cane - single Barista (2 wheels) ?   ?  ?   ? ?PT Goals (current goals can now be found in the care plan  section) Acute Rehab PT Goals ?PT Goal Formulation: With patient ?Time For Goal Achievement: 08/10/21 ?Potential to Achieve Goals: Fair ?Progress towards PT goals: Not progressing toward goals - comment (S/p HD and increased confusion) ? ?  ?Frequency ? ? ? Min 3X/week ? ? ? ?  ?PT Plan Current plan remains appropriate  ? ? ?Co-evaluation   ?Reason for Co-Treatment: Complexity of the patient's impairments (multi-system involvement);Necessary to address cognition/behavior during functional activity;For patient/therapist safety;To address functional/ADL transfers ?  ?OT goals addressed during session: ADL's and self-care;Strengthening/ROM ?  ? ?  ?AM-PAC PT "6 Clicks" Mobility   ?Outcome Measure ? Help needed turning from your back to your side while in a flat bed without using bedrails?: Total ?Help needed moving from lying on your back to sitting on the side of a flat bed without using bedrails?: Total ?Help needed moving to and from a bed to a chair (including a wheelchair)?: Total ?Help needed standing up from a chair using your arms (e.g., wheelchair or bedside chair)?: Total ?Help needed to walk in hospital room?: Total ?Help needed climbing 3-5 steps with a railing? : Total ?6 Click Score: 6 ? ?  ?End of Session Equipment Utilized During Treatment: Gait belt;Oxygen (15 LO2) ?Activity Tolerance: Patient limited by fatigue ?Patient left: with call bell/phone within reach;with family/visitor present;in bed;with bed alarm set ?Nurse Communication: Mobility status ?PT Visit Diagnosis: Unsteadiness on feet (R26.81);Muscle weakness (generalized) (M62.81) ?  ? ? ?Time: 1610-9604 ?PT Time Calculation (min) (ACUTE ONLY): 30 min ? ?Charges:  $Therapeutic Activity: 8-22 mins          ?          ? ?  Annick Dimaio B. Migdalia Dk PT, DPT ?Acute Rehabilitation Services ?Pager [phone] ?Office [phone] ? ? ? ?[place] ?07/29/2021, 1:58 PM ? ?

## 2021-07-30 ENCOUNTER — Ambulatory Visit: Payer: Medicare HMO | Admitting: Infectious Diseases

## 2021-07-30 DIAGNOSIS — I5032 Chronic diastolic (congestive) heart failure: Secondary | ICD-10-CM | POA: Diagnosis not present

## 2021-07-30 DIAGNOSIS — E1100 Type 2 diabetes mellitus with hyperosmolarity without nonketotic hyperglycemic-hyperosmolar coma (NKHHC): Secondary | ICD-10-CM | POA: Diagnosis not present

## 2021-07-30 DIAGNOSIS — J9621 Acute and chronic respiratory failure with hypoxia: Secondary | ICD-10-CM | POA: Diagnosis not present

## 2021-07-30 LAB — CBC
HCT: 43.6 % (ref 39.0–52.0)
Hemoglobin: 14.3 g/dL (ref 13.0–17.0)
MCH: 27.8 pg (ref 26.0–34.0)
MCHC: 32.8 g/dL (ref 30.0–36.0)
MCV: 84.8 fL (ref 80.0–100.0)
Platelets: 236 10*3/uL (ref 150–400)
RBC: 5.14 MIL/uL (ref 4.22–5.81)
RDW: 15 % (ref 11.5–15.5)
WBC: 12.3 10*3/uL — ABNORMAL HIGH (ref 4.0–10.5)
nRBC: 0 % (ref 0.0–0.2)

## 2021-07-30 LAB — RENAL FUNCTION PANEL
Albumin: 3.5 g/dL (ref 3.5–5.0)
Anion gap: 15 (ref 5–15)
BUN: 47 mg/dL — ABNORMAL HIGH (ref 8–23)
CO2: 26 mmol/L (ref 22–32)
Calcium: 8.4 mg/dL — ABNORMAL LOW (ref 8.9–10.3)
Chloride: 93 mmol/L — ABNORMAL LOW (ref 98–111)
Creatinine, Ser: 5.91 mg/dL — ABNORMAL HIGH (ref 0.61–1.24)
GFR, Estimated: 10 mL/min — ABNORMAL LOW (ref >60)
Glucose, Bld: 112 mg/dL — ABNORMAL HIGH (ref 70–99)
Phosphorus: 4.9 mg/dL — ABNORMAL HIGH (ref 2.5–4.6)
Potassium: 4 mmol/L (ref 3.5–5.1)
Sodium: 134 mmol/L — ABNORMAL LOW (ref 135–145)

## 2021-07-30 LAB — GLUCOSE, CAPILLARY
Glucose-Capillary: 105 mg/dL — ABNORMAL HIGH (ref 70–99)
Glucose-Capillary: 130 mg/dL — ABNORMAL HIGH (ref 70–99)
Glucose-Capillary: 134 mg/dL — ABNORMAL HIGH (ref 70–99)
Glucose-Capillary: 136 mg/dL — ABNORMAL HIGH (ref 70–99)

## 2021-07-30 MED ORDER — CHLORHEXIDINE GLUCONATE CLOTH 2 % EX PADS
6.0000 | MEDICATED_PAD | Freq: Every day | CUTANEOUS | Status: DC
  Administered 2021-07-31 – 2021-08-04 (×4): 6 via TOPICAL

## 2021-07-30 MED ORDER — NOREPINEPHRINE 4 MG/250ML-% IV SOLN
INTRAVENOUS | Status: AC
  Filled 2021-07-30: qty 250

## 2021-07-30 NOTE — Progress Notes (Signed)
? ?NAME:  Melvin Mcclure, MRN:  [id number], DOB:  1950-07-17, LOS: 7 ?ADMISSION DATE:  07/22/2021, CONSULTATION DATE:  07/22/21 ?REFERRING MD:  Dr. Lorin Mercy, CHIEF COMPLAINT:  Recurrent pleural effusion ? ?History of Present Illness:  ?71 year old gentleman with past medical history of CKD 5, HFpEF, type 2 diabetes, strongyloidiasis infection status post ivermectin treatment, who presents to the hospital for worsening shortness of breath 3-4 days ago.  He is on 2 L of oxygen at home.  He denies coughing, sputum production or wheezing.  Denies fever, night sweat or weight loss.  Reports compliance to diuretics but urine output has been decreased.  He quit smoking in 1960s. ? ?Patient have history of recurrent right pleural effusion.  Has had thoracentesis in 12/2019, 04/2020 and most recently 05/24/2021.  Lights criteria for all of them were transudative. ? ?Pertinent  Medical History  ? ?Past Medical History:  ?Diagnosis Date  ? Anemia in chronic kidney disease (CKD)   ? stage 5  ? Anxiety   ? Arthritis   ? CHF (congestive heart failure) ([place]) 05/28/2018  ? dx 05/28/18  ? Diabetes mellitus without complication ([place])   ? Hyperlipidemia   ? Hypertension   ? Iron deficiency anemia   ? Neuromuscular disorder ([place])   ? neuropathy feet/hands  ? 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 Stage 4 chronic kidney disease ([place])   ?  ? ?Significant Hospital Events: ?Including procedures, antibiotic start and stop dates in addition to other pertinent events   ?3/20 admitted, PCCM consult ?3/21 thoracentesis ?3/22 bronchoscopy w/ RLL TBBx for 3 year nonresolving infiltrate ?3/23 iHD initiation ?3/24 onwards, BIPAP wean attempts ? ?Interim History / Subjective:  ?Patient refused BiPAP the past 24 hours ? ?No other acute events ? ?Denied chest pain/sob ?Objective   ?Blood pressure (!) 144/63, pulse 85, temperature 98.5 ?F (36.9 ?C), temperature source Oral, resp. rate 17, height _0  (1.778 m), weight 86.4 kg, SpO2 100 %. On 6L  HFNC ?   ?FiO2 (%):  [50 %] 50 %  ? ?Intake/Output Summary (Last 24 hours) at 07/30/2021 0919 ?Last data filed at 07/30/2021 0400 ?Gross per 24 hour  ?Intake --  ?Output 4200 ml  ?Net -4200 ml  ? ?Filed Weights  ? 07/29/21 0730 07/29/21 1200 07/30/21 0500  ?Weight: 89.7 kg 85.7 kg 86.4 kg  ? ? ?Examination: ?General: In bed, NAD, appears comfortable, chronically ill appearing ?HEENT: MM pink/moist, anicteric, atraumatic ?Neuro: RASS 0, PERRL 49m, GCS 14, eyes open to verbal, fc, slowly answered orientation questions with prodding ?CV: S1S2, NSR, no m/r/g appreciated ?PULM:  clear in the upper lobes, clear in the lower lobes, trachea midline, chest expansion symmetric ?GI: soft, bsx4 active, non-tender   ?Extremities: warm/dry, no pretibial edema, capillary refill less than 3 seconds  ?Skin:  no rashes or lesions noted ? ?Labs ?NA 134 ?Creat 5.91 ?BUN 67>47 ?WBC 9.9>12.3 ? ?Resolved Hospital Problem list   ? ? ?Assessment & Plan:  ?Acute hypoxemic and hypercapneic respiratory failure- improving ?10L HFNC>6L, refusing Bipap again overnight and did not wear bipap on AM 3/27. Growing provetella in BAL. Afebrile. WBC 9.9>12.3 ?-Continue BiPAP QHS, HFNC daily ?-Pulmonary toilet as able ?-BAL positive for Prevotella; Continue Augmentin, end date 08/05/21. Follow fungal cultures ?-Fluid removal with iHD ?-Follow fever/wbc curve ? ?Acute metabolic encephalopathy ?? ICU delirium component to confusion/lethargy vs uremia ?-Continue iHD for uremia clearance ?-OOB as able ?-Delirium precuations ? ?Chronic bibasilar infiltrates- bronch/biopsy reassuring against malignancy or smoldering infection but  final cultures will take weeks to come back. ?-Follow up cultures ? ?ESRD ?-Nephrology following ?-HD MWF ? ?DM2,  ?Glucose level at goal ?-Blood Glucose goal 140-180. ?-Continue SSI and long acting ? ?HX HTN, HFpEF ?-Continue amlodipine, hydralazine, imdur ? ?Dispo: pending ? ?Stable for transfer to progressive care. PCCM to sign patient  out to [facility] for pickup on 3/29 at 0700. ? ?Best Practice (right click and "Reselect all SmartList Selections" daily)  ? ?Diet/type: renal ?DVT prophylaxis: heparin ?GI prophylaxis: NA ?Lines: Na ?Foley:  NA ?Code Status:  full code ?Last date of multidisciplinary goals of care discussion [full scope, wife updated daily] ? ?Critical Care time: NA ? ?Redmond School., MSN, APRN, AGACNP-BC ?[facility]  ?07/30/2021 , 9:19 AM ? ?Please see [email or web address] for pager details ? ?If no response, please call [phone] ?After hours, please call Elink at [phone] ? ? ?  ?

## 2021-07-30 NOTE — Progress Notes (Signed)
Inpatient Rehab Admissions Coordinator:  ? ? I do not have a CIR bed for this Pt. Currently, he does not appear able to tolerate but I will follow for 1-2 more therapy sessions and pursue admission if tolerance improves. If Pt. Does not demonstrate progress or is ready to discharge in the next couple of days, he will likely need a rehab venue with lower intensity . ? ?Clemens Catholic, MS, CCC-SLP ?Rehab Admissions Coordinator  ?[phone] (celll) ?[phone] (office) ? ?

## 2021-07-30 NOTE — Progress Notes (Signed)
Ipad, phone, clothes, chargers, and snacks all brought to new room on 5N ?

## 2021-07-30 NOTE — Progress Notes (Addendum)
Admit: 07/22/2021 ?LOS: 7 ? ?Mathieu Schloemer is a/an 71 y.o. male with a past medical history notable for renal failure (transplant candidate; left brachiocephalic AV fistula created 39/76/73 ), diastolic CHF, HTN, A1PF who presents with acute hypoxic respiratory failure.    ? ?Subjective:  ?Yesterday received haldol. Nursing staff attempted to redirect patient from getting out of bed. He states his name but is slow to do so and is quite sleepy.   HD yest-  removed 4000- tolerated well  ? ?03/27 0701 - 03/28 0700 ?In: -  ?Out: 4200 [Urine:200] ? ?Filed Weights  ? 07/29/21 0730 07/29/21 1200 07/30/21 0500  ?Weight: 89.7 kg 85.7 kg 86.4 kg  ? ? ?Scheduled Meds: ? amLODipine  5 mg Oral Daily  ? amoxicillin-clavulanate  1 tablet Oral Q2000  ? aspirin  81 mg Oral Daily  ? calcitRIOL  0.25 mcg Oral QODAY  ? calcium acetate  1,334 mg Oral TID WC  ? calcium carbonate  2 tablet Oral QHS  ? Chlorhexidine Gluconate Cloth  6 each Topical Daily  ? docusate sodium  100 mg Oral BID  ? feeding supplement (NEPRO CARB STEADY)  237 mL Oral BID BM  ? heparin  5,000 Units Subcutaneous Q8H  ? hydrALAZINE  50 mg Oral TID  ? insulin aspart  0-5 Units Subcutaneous QHS  ? insulin aspart  0-6 Units Subcutaneous TID WC  ? insulin glargine-yfgn  20 Units Subcutaneous Daily  ? isosorbide mononitrate  60 mg Oral QPM  ? meclizine  25 mg Oral BID  ? mouth rinse  15 mL Mouth Rinse BID  ? polyethylene glycol  17 g Oral Daily  ? senna-docusate  1 tablet Oral QHS  ? sodium chloride flush  3 mL Intravenous Q12H  ? ?Continuous Infusions: ? sodium chloride Stopped (07/24/21 2238)  ? sodium chloride    ? sodium chloride    ? ?PRN Meds:.sodium chloride, sodium chloride, sodium chloride, acetaminophen **OR** acetaminophen, albuterol, alteplase, calcium carbonate (dosed in mg elemental calcium), docusate sodium, haloperidol lactate, heparin, hydrALAZINE, HYDROcodone-acetaminophen, lidocaine (PF), lidocaine-prilocaine, ondansetron **OR** ondansetron (ZOFRAN) IV,  pentafluoroprop-tetrafluoroeth, phenol, senna-docusate, sorbitol, zolpidem ? ? ?Physical Exam:  Blood pressure (!) 155/59, pulse 80, temperature 98.4 ?F (36.9 ?C), temperature source Axillary, resp. rate 14, height '5\' 10"'$  (1.778 m), weight 86.4 kg, SpO2 98 %. ?General: Appears chronically ill and sleepy, no acute distress. Age appropriate. ?Cardiac: RRR, normal heart sounds, no murmurs ?Respiratory: CTAB, normal effort ?Abdomen: soft, nontender, nondistended ?Extremities: Mild LE edema ?Neuro: alert and oriented to self. Slow to speak.  ?Dialysis access: LUE AVF +b/t ? ?Recent Labs  ?Lab 07/26/21 ?0735 07/27/21 ?0830 07/27/21 ?1242 07/29/21 ?0804  ?NA 134* 133* 138 135  ?K 4.8 4.0 3.7 4.0  ?CL 95* 95*  --  99  ?CO2 24 27  --  25  ?GLUCOSE 216* 204*  --  99  ?BUN 109* 81*  --  67*  ?CREATININE 8.06* 7.07*  --  6.09*  ?CALCIUM 7.2* 7.1*  --  7.1*  ?PHOS 9.1* 5.6*  --  4.3  ? ? ?Recent Labs  ?Lab 07/26/21 ?0803 07/27/21 ?0830 07/27/21 ?1242 07/29/21 ?0804  ?WBC 9.0 8.2  --  9.9  ?HGB 11.6* 10.4* 12.9* 11.2*  ?HCT 35.7* 32.7* 38.0* 35.2*  ?MCV 86.9 87.4  --  86.9  ?PLT 183 157  --  178  ? ? ?A/P ?Shane Badeaux is a/an 71 y.o. male with a past medical history notable for renal failure (transplant candidate; left brachiocephalic AV  fistula created 30/09/23 ), diastolic CHF, HTN, R0QT.   ?  ?Acute hypoxic respiratory failure ?Hypervolemia 2/2 AoC CKD5, worsening  Now ESRD ?H/o CHF ?-started HD1 via his functional LUE AVF on 3/23 due to uremia and inadequate diuresis with high dose lasix.  Next treatment 3/29, will maintain MWF schedule for now ?-CLIP for outpatient placement- have heard will be [facility] but still seems a ways from discharge-  will change his dialysis schedule eventually  ?-high risk for intubation esp w/ bipap dependency-per PCCM ?-Continue to monitor daily Cr, Dose meds for GFR<15 ?-Maintain MAP>65 for optimal renal perfusion.   ?-Respiratory management per CCM ?-Avoid nephrotoxic medications including  NSAIDs and iodinated intravenous contrast exposure unless the latter is absolutely indicated.  Preferred narcotic agents for pain control are hydromorphone, fentanyl, and methadone. Morphine should not be used. Avoid Baclofen and avoid oral sodium phosphate and magnesium citrate based laxatives / bowel preps. Continue strict Input and Output monitoring. Will monitor the patient closely with you and intervene or adjust therapy as indicated by changes in clinical status/labs  ? ?Hypertension, stable ?A few soft BP readings. Consider decreasing or discontinuing BP meds if MAP <65 ?-Hydral TID, amlodipine, imdur-- I will stop amlodipine so can continue to UF with HD ? ?Rt pleural effusion ?-s/p thora 3/22>transudative ? ?Anemia of CKD ?-transfuse PRN for hgb <7 ?-hgb currently at goal, not on ESA yet ? ?CKD-MBD ?-phoslo to 2 tabs tidac-  most recent phos OK, c/w calcitriol for low calcium. Renal diet. PTH was 81 ?  ?T2DM ?-manage per primary team ? ?Confusion ?-uremia vs other causes? S/p several HD treatments now. Waxing and waning. Today appears more sleepy. Did get haldol. He is attempting to get out of bed on multiple occasions.  ? ?Gerlene Fee, DO ?07/30/2021, 7:29 AM ?PGY-3, [place] ? ?Patient seen and examined, agree with above note with above modifications. Essentially a new start to dialysis for [place] due to uremia and volume overload-  has improved but it is slow- plan for next HD tomorrow-  keep on MWF for now-  OP sched will be TTS-  adjust HD related meds as needed  ?Corliss Parish, MD ?07/30/2021 ? ? ? ? ? ?

## 2021-07-30 NOTE — Progress Notes (Signed)
Spoke to pt's wife via phone late yesterday afternoon. Contacted pt's wife earlier today to see when she would be available to discuss pt's out-pt schedule in more detail. Awaiting response. Will assist as needed.  ? ?Melven Sartorius ?Renal Navigator ?[phone] ?

## 2021-07-31 DIAGNOSIS — J9621 Acute and chronic respiratory failure with hypoxia: Secondary | ICD-10-CM | POA: Diagnosis not present

## 2021-07-31 DIAGNOSIS — E1100 Type 2 diabetes mellitus with hyperosmolarity without nonketotic hyperglycemic-hyperosmolar coma (NKHHC): Secondary | ICD-10-CM | POA: Diagnosis not present

## 2021-07-31 DIAGNOSIS — G9341 Metabolic encephalopathy: Secondary | ICD-10-CM

## 2021-07-31 DIAGNOSIS — I5033 Acute on chronic diastolic (congestive) heart failure: Secondary | ICD-10-CM | POA: Diagnosis not present

## 2021-07-31 LAB — BASIC METABOLIC PANEL
Anion gap: 16 — ABNORMAL HIGH (ref 5–15)
BUN: 66 mg/dL — ABNORMAL HIGH (ref 8–23)
CO2: 24 mmol/L (ref 22–32)
Calcium: 8.5 mg/dL — ABNORMAL LOW (ref 8.9–10.3)
Chloride: 94 mmol/L — ABNORMAL LOW (ref 98–111)
Creatinine, Ser: 7.24 mg/dL — ABNORMAL HIGH (ref 0.61–1.24)
GFR, Estimated: 7 mL/min — ABNORMAL LOW (ref >60)
Glucose, Bld: 112 mg/dL — ABNORMAL HIGH (ref 70–99)
Potassium: 4 mmol/L (ref 3.5–5.1)
Sodium: 134 mmol/L — ABNORMAL LOW (ref 135–145)

## 2021-07-31 LAB — GLUCOSE, CAPILLARY
Glucose-Capillary: 105 mg/dL — ABNORMAL HIGH (ref 70–99)
Glucose-Capillary: 109 mg/dL — ABNORMAL HIGH (ref 70–99)
Glucose-Capillary: 123 mg/dL — ABNORMAL HIGH (ref 70–99)
Glucose-Capillary: 114 mg/dL — ABNORMAL HIGH (ref 70–99)

## 2021-07-31 LAB — CBC
HCT: 42.4 % (ref 39.0–52.0)
Hemoglobin: 13.9 g/dL (ref 13.0–17.0)
MCH: 27.5 pg (ref 26.0–34.0)
MCHC: 32.8 g/dL (ref 30.0–36.0)
MCV: 84 fL (ref 80.0–100.0)
Platelets: 274 10*3/uL (ref 150–400)
RBC: 5.05 MIL/uL (ref 4.22–5.81)
RDW: 15 % (ref 11.5–15.5)
WBC: 13.3 10*3/uL — ABNORMAL HIGH (ref 4.0–10.5)
nRBC: 0 % (ref 0.0–0.2)

## 2021-07-31 NOTE — Assessment & Plan Note (Addendum)
Likely ICU delirium component to confusion/lethargy vs uremia ?Now on hemodialysis.   ?Mentation is gradually improving.  No focal neurological deficits.   ?

## 2021-07-31 NOTE — Progress Notes (Signed)
? ?[facility] ?PROGRESS NOTE ? ? ?Melvin Mcclure ZRA:[id number] DOB: Oct 19, 1950 DOA: 07/22/2021  8 ?DOS: the patient was seen and examined on 07/31/2021 ? ?PCP: Nolene Ebbs, MD ? ?Brief History and Hospital Course:  ?71 y.o. 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  Apparently uses 2 L of oxygen by nasal cannula at home as needed.  Patient with history of recurrent pleural effusion.  Seen by pulmonology.  Had high oxygen requirements.  Was treated transferred to the ICU.  Underwent diagnostic and therapeutic thoracocentesis with removal of 1500 MLS of fluid on 07/23/2021.  Patient then underwent flexible bronchoscopy with brushing and bronchoalveolar lavage and transbronchial lung biopsy on 07/24/2021 for nonresolving opacity with recurrent pleural effusion. Required BiPAP.  Then transitioned to high flow nasal cannula.  BiPAP recommended at nighttime by pulmonology which the patient has been refusing.  Subsequently transferred back to floor. ? ?Consultants: Pulmonology.  Nephrology ? ?Procedures: Thoracentesis.  Bronchoscopy ? ? ? ?Subjective: ?Patient pleasantly confused.  Seen while at hemodialysis.  Denies any pain currently. ? ? ? ?Assessment/Plan: ? ? ?* Acute on chronic respiratory failure with hypoxia ([place]) ?Recurrent pleural effusion ?Chronic bibasilar infiltrate ? ?Likely secondary to recurrent pleural effusion.  Underwent thoracentesis.  Seen by pulmonology.   ? ?Status post thoracocentesis on  07/23/2021 with removal of 1500 mL of pleural fluid.  Pleural fluid culture negative in 24 hours.  Additionally, patient underwent flexible bronchoscopy with brushing and bronchoalveolar lavage and transbronchial lung biopsy on 07/24/2021.  ?BAL positive for Prevotella; Continue  Augmentin, end date 08/05/21. Follow fungal cultures.  Remains afebrile.  WBC noted to be mildly elevated. ? ?Noted to be on 3 L of oxygen by nasal cannula.  Continues to refuse BiPAP at night.  Will need continued encouragement. ? ?Acute metabolic encephalopathy ?? ICU delirium component to confusion/lethargy vs uremia ?Now on hemodialysis.  Delirium precautions.  Monitor mentation.  No focal neurological deficits. ? ?Stage 5 chronic kidney disease ([place]) ?Progression to end-stage renal disease ? ?Progression of kidney disease during this admission.  Nephrology was consulted.  Initially given high-dose furosemide without improvement.  Subsequently started on hemodialysis. ?Currently being dialyzed on a Monday Wednesday Friday schedule. ? ?Chronic diastolic CHF (congestive heart failure) ([place]) ?Volume being managed with hemodialysis now.   ?2D echocardiogram done 07/23/2021 shows preserved LV function with LV ejection fraction of 55 to 60% with no regional wall motion abnormality and grade 2 diastolic dysfunction. ? ?DM2 (diabetes mellitus, type 2) ([place]) ?Noted to be on SSI and glargine.  Monitor CBGs.  HbA1c 7.0.   ? ?HTN (hypertension) ?Blood pressure reasonably well controlled.  Noted to be on hydralazine isosorbide mononitrate.  Looks like he has been taken off of amlodipine and carvedilol. ? ?Strongyloidiasis ?-Should have been effectively treated with Ivermectin x 2 rounds ?-F/u with ID and/or transplant as needed ? ? ? ?DVT Prophylaxis: Subcutaneous heparin ?Code Status: Full code ?Family Communication: Discussed with patient.  No family at bedside ?Disposition Plan: To be determined.  Inpatient rehabilitation recommended by therapy ? ?Status is: Inpatient ?Remains inpatient appropriate because: Progression to end-stage renal disease, acute respiratory failure ? ? ? ? ?Medications: Scheduled: ? amoxicillin-clavulanate  1 tablet Oral Q2000  ? aspirin  81 mg Oral Daily  ? calcitRIOL  0.25 mcg Oral QODAY  ? calcium  acetate  1,334 mg Oral TID  WC  ? calcium carbonate  2 tablet Oral QHS  ? Chlorhexidine Gluconate Cloth  6 each Topical Daily  ? Chlorhexidine Gluconate Cloth  6 each Topical Q0600  ? docusate sodium  100 mg Oral BID  ? feeding supplement (NEPRO CARB STEADY)  237 mL Oral BID BM  ? heparin  5,000 Units Subcutaneous Q8H  ? hydrALAZINE  50 mg Oral TID  ? insulin aspart  0-5 Units Subcutaneous QHS  ? insulin aspart  0-6 Units Subcutaneous TID WC  ? insulin glargine-yfgn  20 Units Subcutaneous Daily  ? isosorbide mononitrate  60 mg Oral QPM  ? meclizine  25 mg Oral BID  ? mouth rinse  15 mL Mouth Rinse BID  ? polyethylene glycol  17 g Oral Daily  ? senna-docusate  1 tablet Oral QHS  ? sodium chloride flush  3 mL Intravenous Q12H  ? ?Continuous: ? sodium chloride Stopped (07/24/21 2238)  ? sodium chloride    ? sodium chloride    ? ?UYQ:IHKVQQ chloride, sodium chloride, sodium chloride, acetaminophen **OR** acetaminophen, albuterol, alteplase, calcium carbonate (dosed in mg elemental calcium), docusate sodium, haloperidol lactate, heparin, hydrALAZINE, HYDROcodone-acetaminophen, lidocaine (PF), lidocaine-prilocaine, ondansetron **OR** ondansetron (ZOFRAN) IV, pentafluoroprop-tetrafluoroeth, phenol, senna-docusate, sorbitol, zolpidem ? ?Antibiotics: ?Anti-infectives (From admission, onward)  ? ? Start     Dose/Rate Route Frequency Ordered Stop  ? 07/29/21 1200  amoxicillin-clavulanate (AUGMENTIN) 500-125 MG per tablet 500 mg       ? 1 tablet Oral Daily 07/29/21 0914 08/05/21 1959  ? ?  ? ? ?Objective: ? ?Vital Signs ? ?Vitals:  ? 07/31/21 0840 07/31/21 0900 07/31/21 0930 07/31/21 1005  ?BP: 112/70 101/68 115/68 107/62  ?Pulse: 84 86 85 86  ?Resp: 17 19 (!) 22 19  ?Temp:      ?TempSrc:      ?SpO2:  100% 99%   ?Weight:      ?Height:      ? ? ?Intake/Output Summary (Last 24 hours) at 07/31/2021 1021 ?Last data filed at 07/31/2021 0600 ?Gross per 24 hour  ?Intake 110 ml  ?Output 0 ml  ?Net 110 ml  ? ?Filed Weights  ? 07/30/21 0500  07/31/21 0600 07/31/21 0800  ?Weight: 86.4 kg 85 kg 88 kg  ? ? ?General appearance: Awake alert.  In no distress.  Distracted ?Resp: Normal effort at rest.  Diminished air entry at the bases with few crackles.  No wheezing or rhonchi. ?Cardio: S1-S2 is normal regular.  No S3-S4.  No rubs murmurs or bruit ?GI: Abdomen is soft.  Nontender nondistended.  Bowel sounds are present normal.  No masses organomegaly ?Extremities: No edema.   ?Neurologic:   No focal neurological deficits.  ? ? ?Lab Results: ? ?Data Reviewed: I have personally reviewed labs and imaging study reports ? ?CBC: ?Recent Labs  ?Lab 07/26/21 ?0803 07/27/21 ?0830 07/27/21 ?1242 07/29/21 ?0804 07/30/21 ?5956 07/31/21 ?0150  ?WBC 9.0 8.2  --  9.9 12.3* 13.3*  ?HGB 11.6* 10.4* 12.9* 11.2* 14.3 13.9  ?HCT 35.7* 32.7* 38.0* 35.2* 43.6 42.4  ?MCV 86.9 87.4  --  86.9 84.8 84.0  ?PLT 183 157  --  178 236 274  ? ? ?Basic Metabolic Panel: ?Recent Labs  ?Lab 07/25/21 ?0720 07/25/21 ?3875 07/26/21 ?0735 07/27/21 ?0830 07/27/21 ?1242 07/29/21 ?0804 07/30/21 ?6433 07/31/21 ?0150  ?NA 134*   < > 134* 133* 138 135 134* 134*  ?K 4.6   < > 4.8 4.0 3.7 4.0 4.0 4.0  ?CL 91*  --  95* 95*  --  99 93* 94*  ?CO2 26  --  24 27  --  _0 ?GLUCOSE 230*  --  216* 204*  --  99 112* 112*  ?BUN 118*  --  109* 81*  --  67* 47* 66*  ?CREATININE 7.93*  --  8.06* 7.07*  --  6.09* 5.91* 7.24*  ?CALCIUM 7.4*  --  7.2* 7.1*  --  7.1* 8.4* 8.5*  ?PHOS 10.7*  --  9.1* 5.6*  --  4.3 4.9*  --   ? < > = values in this interval not displayed.  ? ? ?GFR: ?Estimated Creatinine Clearance: 10.5 mL/min (A) (by C-G formula based on SCr of 7.24 mg/dL (H)). ? ?Liver Function Tests: ?Recent Labs  ?Lab 07/25/21 ?0720 07/26/21 ?0735 07/27/21 ?0830 07/29/21 ?0804 07/30/21 ?1660  ?ALBUMIN 3.0* 3.1* 2.9* 2.8* 3.5  ? ? ?CBG: ?Recent Labs  ?Lab 07/30/21 ?0730 07/30/21 ?1133 07/30/21 ?1558 07/30/21 ?1944 07/31/21 ?0740  ?GLUCAP 105* 130* 134* 136* 105*  ? ? ? ? ?Recent Results (from the past 240 hour(s))   ?Resp Panel by RT-PCR (Flu A&B, Covid) Nasopharyngeal Swab     Status: None  ? Collection Time: 07/22/21  6:50 AM  ? Specimen: Nasopharyngeal Swab; Nasopharyngeal(NP) swabs in vial transport medium  ?Resu

## 2021-07-31 NOTE — Plan of Care (Signed)
?  Problem: Education: ?Goal: Ability to demonstrate management of disease process will improve ?Outcome: Progressing ?Goal: Ability to verbalize understanding of medication therapies will improve ?Outcome: Not Progressing ?Goal: Individualized Educational Video(s) ?Outcome: Not Progressing ?  ?Problem: Activity: ?Goal: Capacity to carry out activities will improve ?Outcome: Not Progressing ?  ?Problem: Education: ?Goal: Knowledge of General Education information will improve ?Description: Including pain rating scale, medication(s)/side effects and non-pharmacologic comfort measures ?Outcome: Not Progressing ?  ?Problem: Health Behavior/Discharge Planning: ?Goal: Ability to manage health-related needs will improve ?Outcome: Progressing ?  ?Problem: Clinical Measurements: ?Goal: Ability to maintain clinical measurements within normal limits will improve ?Outcome: Progressing ?Goal: Will remain free from infection ?Outcome: Progressing ?Goal: Diagnostic test results will improve ?Outcome: Progressing ?Goal: Respiratory complications will improve ?Outcome: Progressing ?Goal: Cardiovascular complication will be avoided ?Outcome: Progressing ?  ?Problem: Activity: ?Goal: Risk for activity intolerance will decrease ?Outcome: Progressing ?  ?Problem: Coping: ?Goal: Level of anxiety will decrease ?Outcome: Progressing ?  ?Problem: Nutrition: ?Goal: Adequate nutrition will be maintained ?Outcome: Progressing ?  ?Problem: Pain Managment: ?Goal: General experience of comfort will improve ?Outcome: Progressing ?  ?Problem: Safety: ?Goal: Ability to remain free from injury will improve ?Outcome: Progressing ?  ?

## 2021-07-31 NOTE — Progress Notes (Signed)
Mobility Specialist: Progress Note ? ? 07/31/21 1751  ?Mobility  ?Activity Transferred from chair to bed  ?Level of Assistance Minimal assist, patient does 75% or more  ?Assistive Device Other (Comment) ?(HHA)  ?Distance Ambulated (ft) 2 ft  ?Activity Response Tolerated well  ?$Mobility charge 1 Mobility  ? ?Pt received in chair, refused ambulation but agreeable to transfer back to bed. MinA to stand using HHA. Pt required frequent verbal cues for direction once standing, no c/o throughout. Pt back in bed with call bell and phone at his side. Bed alarm is on.  ? ?Melvin Mcclure ?Mobility Specialist ?Mobility Specialist [place]: [phone] ?Mobility Specialist [place]: [phone] ? ?

## 2021-07-31 NOTE — Progress Notes (Signed)
PT Cancellation Note ? ?Patient Details ?Name: Melvin Mcclure ?MRN: [id number] ?DOB: 1950/07/26 ? ? ?Cancelled Treatment:    Reason Eval/Treat Not Completed: Patient at procedure or test/unavailable (HD). Will follow-up for PT treatment as schedule permits. ? ?Mabeline Caras, PT, DPT ?Acute Rehabilitation Services  ?Pager [phone] ?Office [phone] ? ?Derry Lory ?07/31/2021, 8:32 AM ? ? ?

## 2021-07-31 NOTE — Progress Notes (Signed)
Pt refusing CPAP

## 2021-07-31 NOTE — Progress Notes (Signed)
Inpatient Rehab Admissions Coordinator:  I ? ?I had a lengthy conversation with Pt.'s wife regarding potential CIR admit. I explained that right now, Pt. Has not demonstrated the ability to tolerate the intensity of CIR and that insurance amy not approve CIR for his diagnoses. I cannot submit a case to insurance until Pt. Demonstrates increased tolerance with therapies, as Pt. Currently appears more SNF appropriate. I notified TOC earlier today that pt. May need SNF. Wife dislikes the idea but was not entirely opposed if there are no other options. I also explained to her that Pt. May be stable for d/c in the next few days, so we cannot keep him here waiting to see if he starts to demonstrate improved tolerance. [facility] team will continue to follow for progress/participation with therapies, but I recommend TOC look at other rehab venues. ? ?Clemens Catholic, MS, CCC-SLP ?Rehab Admissions Coordinator  ?[phone] (celll) ?[phone] (office) ? ?

## 2021-07-31 NOTE — Procedures (Addendum)
Patient was seen on dialysis and the procedure was supervised.  BFR 350  Via AVF BP is  101/68. ? ? Patient appears to be tolerating treatment well ? ?Melvin Mcclure ?07/31/2021 ? ?

## 2021-07-31 NOTE — Plan of Care (Signed)
°  Problem: Clinical Measurements: °Goal: Ability to maintain clinical measurements within normal limits will improve °Outcome: Progressing °Goal: Will remain free from infection °Outcome: Progressing °Goal: Diagnostic test results will improve °Outcome: Progressing °Goal: Respiratory complications will improve °Outcome: Progressing °  °

## 2021-07-31 NOTE — Progress Notes (Signed)
Physical Therapy Treatment ?Patient Details ?Name: Zahid Carneiro ?MRN: [id number] ?DOB: 1950/09/18 ?Today's Date: 07/31/2021 ? ? ?History of Present Illness Pt is a 71 y.o. male admitted 07/22/21 with worsening SOB; workup for acute on chronic respiratory failure with recurrent pleural effusion initially requiring bipap. S/p thoracentesis 3/21. S/p RLL bronchoscopy, bronchoalveolar lavage, transbronchial lung biopsy 3/22. Course complicated by AKI on CKD; iHD initiated 3/23. Pt also with acute methabolic encephalopathy ; question ICU delirium vs uremia. PMH includes CKD 5 (undergoing transplant evaluation), HFpEF, DM2. ?  ?PT Comments  ? ? Pt progressing well with mobility this session. Initially performed maximove lift to recliner since pt maxA+2-totalA+2 past sessions. Once seated in recliner, pt able to progress to standing and pre-gait activity; requiring min-modA+2 for mobility, maxA for cuing/cognition. Pt requiring increased time and multimodal cuing to complete tasks; flat affect with minimal verbalizations this session. Pt remains limited by generalized weakness, decreased activity tolerance, poor balance strategies/postural reactions and impaired cognition. Pt demonstrates adequate tolerance for intensive AIR-level therapies to maximize functional mobility and independence prior to return home. Will continue to follow acutely to address established goals. ? ?SpO2 99% on 2L O2 [place], HR 92 ?   ?Recommendations for follow up therapy are one component of a multi-disciplinary discharge planning process, led by the attending physician.  Recommendations may be updated based on patient status, additional functional criteria and insurance authorization. ? ?Follow Up Recommendations ? Acute inpatient rehab (3hours/day) ?  ?  ?Assistance Recommended at Discharge Frequent or constant Supervision/Assistance  ?Patient can return home with the following A little help with walking and/or transfers;A little help with  bathing/dressing/bathroom;Assistance with cooking/housework;Direct supervision/assist for medications management;Direct supervision/assist for financial management;Help with stairs or ramp for entrance ?  ?Equipment Recommendations ? Other (comment) (TBD)  ?  ?Recommendations for Other Services Rehab consult ? ? ?  ?Precautions / Restrictions Precautions ?Precautions: Fall;Other (comment) ?Precaution Comments: Watch SpO2 (per sister-in-law, wears PRN at home); urine incontinence ?Restrictions ?Weight Bearing Restrictions: No  ?  ? ?Mobility ? Bed Mobility ?Overal bed mobility: Needs Assistance ?Bed Mobility: Rolling ?Rolling: Max assist ?  ?  ?  ?  ?General bed mobility comments: mod indep rolling to R-side with heavy use of bed rail; maxA to roll to LE side, including assist to reach handrail with LUE ?  ? ?Transfers ?Overall transfer level: Needs assistance ?Equipment used: Ambulation equipment used, Rolling walker (2 wheels) ?Transfers: Sit to/from Stand, Bed to chair/wheelchair/BSC ?Sit to Stand: Min assist, +2 physical assistance, Mod assist ?  ?  ?  ?  ?  ?General transfer comment: use of maximove lift to recliner since pt maxA-totalA+2 for sitting EOB activity last two session, pt tolerated well; once seated in recliner, noted improved core engagement, able to progress to standing with minA+2, pt requires multimodal cues to initiate and sequencing, hand over hand assist to follow commands for hand placement; modA for eccentric lowering due to premature sitting ?Transfer via Lift Equipment: [place] ? ?Ambulation/Gait ?  ?  ?  ?  ?  ?  ?Pre-gait activities: performed weight shifts and marching in place with minA ?  ? ? ?Stairs ?  ?  ?  ?  ?  ? ? ?Wheelchair Mobility ?  ? ?Modified Rankin (Stroke Patients Only) ?  ? ? ?  ?Balance Overall balance assessment: Needs assistance ?Sitting-balance support: Bilateral upper extremity supported, No upper extremity supported, Feet supported ?Sitting balance-Leahy Scale:  Poor ?Sitting balance - Comments: sits up in chair without use  of back rest support, reliant on UE support (reaches to bedrail from recliner for this) ?Postural control: Posterior lean, Right lateral lean ?Standing balance support: Bilateral upper extremity supported, During functional activity ?Standing balance-Leahy Scale: Poor ?Standing balance comment: reliant on UE support and external assist ?  ?  ?  ?  ?  ?  ?  ?  ?  ?  ?  ?  ? ?  ?Cognition Arousal/Alertness: Lethargic, Awake/alert ?Behavior During Therapy: Flat affect ?Overall Cognitive Status: Impaired/Different from baseline ?Area of Impairment: Orientation, Attention, Memory, Following commands, Safety/judgement, Awareness, Problem solving ?  ?  ?  ?  ?  ?  ?  ?  ?Orientation Level: Disoriented to, Time, Situation ?Current Attention Level: Sustained ?Memory: Decreased recall of precautions, Decreased short-term memory ?Following Commands: Follows one step commands inconsistently, Follows one step commands with increased time ?Safety/Judgement: Decreased awareness of safety, Decreased awareness of deficits ?Awareness: Intellectual ?Problem Solving: Slow processing, Decreased initiation, Difficulty sequencing, Requires verbal cues, Requires tactile cues ?General Comments: Pt with blank stare majority of session; delayed response time with minimal answers, inconsistent command following. Sister-in-law reports cognition usually "normal" ?  ?  ? ?  ?Exercises   ? ?  ?General Comments General comments (skin integrity, edema, etc.): Pt's sister-in-law present during session. SpO2 99-100% on 2L O2 [place]; down to 84-86% on RA with unreliable pleth ?  ?  ? ?Pertinent Vitals/Pain Pain Assessment ?Pain Assessment: No/denies pain ?Pain Intervention(s): Monitored during session, Repositioned  ? ? ?Home Living   ?  ?  ?  ?  ?  ?  ?  ?  ?  ?   ?  ?Prior Function    ?  ?  ?   ? ?PT Goals (current goals can now be found in the care plan section) Progress towards PT goals:  Progressing toward goals ? ?  ?Frequency ? ? ? Min 3X/week ? ? ? ?  ?PT Plan Current plan remains appropriate  ? ? ?Co-evaluation PT/OT/SLP Co-Evaluation/Treatment: Yes ?Reason for Co-Treatment: Complexity of the patient's impairments (multi-system involvement);Necessary to address cognition/behavior during functional activity;For patient/therapist safety;To address functional/ADL transfers ?PT goals addressed during session: Mobility/safety with mobility;Balance;Proper use of DME ?OT goals addressed during session: ADL's and self-care ?  ? ?  ?AM-PAC PT "6 Clicks" Mobility   ?Outcome Measure ? Help needed turning from your back to your side while in a flat bed without using bedrails?: A Lot ?Help needed moving from lying on your back to sitting on the side of a flat bed without using bedrails?: A Lot ?Help needed moving to and from a bed to a chair (including a wheelchair)?: A Lot ?Help needed standing up from a chair using your arms (e.g., wheelchair or bedside chair)?: A Lot ?Help needed to walk in hospital room?: Total ?Help needed climbing 3-5 steps with a railing? : Total ?6 Click Score: 10 ? ?  ?End of Session Equipment Utilized During Treatment: Oxygen ?Activity Tolerance: Patient tolerated treatment well ?Patient left: in chair;with call bell/phone within reach;with chair alarm set;with nursing/sitter in room ?Nurse Communication: Mobility status ?PT Visit Diagnosis: Unsteadiness on feet (R26.81);Muscle weakness (generalized) (M62.81) ?  ? ? ?Time: 2119-4174 ?PT Time Calculation (min) (ACUTE ONLY): 25 min ? ?Charges:  $Therapeutic Activity: 8-22 mins          ?          ? ?Mabeline Caras, PT, DPT ?Acute Rehabilitation Services  ?Pager [phone] ?Office [phone] ? ?Derry Lory ?07/31/2021,  5:11 PM ? ?

## 2021-07-31 NOTE — Progress Notes (Addendum)
Admit: 07/22/2021 ?LOS: 8 ? ?Melvin Mcclure is a/an 71 y.o. male with a past medical history notable for renal failure (transplant candidate; left brachiocephalic AV fistula created 45/36/46 ), diastolic CHF, HTN, O0HO who presents with acute hypoxic respiratory failure.    ? ?Subjective:  ?No acute events overnight. Seen in HD this morning. No UOP. Steadily increasing Cr. He nods when asks if he feels ok. Still just doesn't seem quite right mentally  ? ?03/28 0701 - 03/29 0700 ?In: 110 [P.O.:110] ?Out: 0  ? ?Filed Weights  ? 07/30/21 0500 07/31/21 0600 07/31/21 0800  ?Weight: 86.4 kg 85 kg 88 kg  ? ? ?Scheduled Meds: ? amoxicillin-clavulanate  1 tablet Oral Q2000  ? aspirin  81 mg Oral Daily  ? calcitRIOL  0.25 mcg Oral QODAY  ? calcium acetate  1,334 mg Oral TID WC  ? calcium carbonate  2 tablet Oral QHS  ? Chlorhexidine Gluconate Cloth  6 each Topical Daily  ? Chlorhexidine Gluconate Cloth  6 each Topical Q0600  ? docusate sodium  100 mg Oral BID  ? feeding supplement (NEPRO CARB STEADY)  237 mL Oral BID BM  ? heparin  5,000 Units Subcutaneous Q8H  ? hydrALAZINE  50 mg Oral TID  ? insulin aspart  0-5 Units Subcutaneous QHS  ? insulin aspart  0-6 Units Subcutaneous TID WC  ? insulin glargine-yfgn  20 Units Subcutaneous Daily  ? isosorbide mononitrate  60 mg Oral QPM  ? meclizine  25 mg Oral BID  ? mouth rinse  15 mL Mouth Rinse BID  ? polyethylene glycol  17 g Oral Daily  ? senna-docusate  1 tablet Oral QHS  ? sodium chloride flush  3 mL Intravenous Q12H  ? ?Continuous Infusions: ? sodium chloride Stopped (07/24/21 2238)  ? sodium chloride    ? sodium chloride    ? ?PRN Meds:.sodium chloride, sodium chloride, sodium chloride, acetaminophen **OR** acetaminophen, albuterol, alteplase, calcium carbonate (dosed in mg elemental calcium), docusate sodium, haloperidol lactate, heparin, hydrALAZINE, HYDROcodone-acetaminophen, lidocaine (PF), lidocaine-prilocaine, ondansetron **OR** ondansetron (ZOFRAN) IV,  pentafluoroprop-tetrafluoroeth, phenol, senna-docusate, sorbitol, zolpidem ? ? ?Physical Exam:  Blood pressure 128/67, pulse 84, temperature 97.7 ?F (36.5 ?C), temperature source Temporal, resp. rate 19, height '5\' 10"'$  (1.778 m), weight 88 kg, SpO2 100 %. ?General: Appears chronically ill weak and tired, no acute distress. Age appropriate. ?Cardiac: RRR, normal heart sounds, no murmurs ?Respiratory: CTAB, normal effort ?Abdomen: soft, nontender, nondistended ?Extremities: Mild LE edema  ?Neuro: alert and oriented to self  ?Dialysis access: LUE AVF +b/t ? ?Recent Labs  ?Lab 07/27/21 ?0830 07/27/21 ?1242 07/29/21 ?0804 07/30/21 ?1224 07/31/21 ?0150  ?NA 133*   < > 135 134* 134*  ?K 4.0   < > 4.0 4.0 4.0  ?CL 95*  --  99 93* 94*  ?CO2 27  --  '25 26 24  '$ ?GLUCOSE 204*  --  99 112* 112*  ?BUN 81*  --  67* 47* 66*  ?CREATININE 7.07*  --  6.09* 5.91* 7.24*  ?CALCIUM 7.1*  --  7.1* 8.4* 8.5*  ?PHOS 5.6*  --  4.3 4.9*  --   ? < > = values in this interval not displayed.  ? ? ?Recent Labs  ?Lab 07/29/21 ?0804 07/30/21 ?8250 07/31/21 ?0150  ?WBC 9.9 12.3* 13.3*  ?HGB 11.2* 14.3 13.9  ?HCT 35.2* 43.6 42.4  ?MCV 86.9 84.8 84.0  ?PLT 178 236 274  ? ? ?A/P ?Melvin Mcclure is a/an 71 y.o. male with a past medical history  notable for renal failure ( left brachiocephalic AV fistula created 77/82/42 ), diastolic CHF, HTN, P5TI.   ?  ?Acute hypoxic respiratory failure ?Hypervolemia 2/2 AoC CKD5, worsening  Now ESRD ?H/o CHF ?-started HD1 3/23 due to uremia and inadequate diuresis with high dose lasix.  Next treatment today, will maintain MWF schedule for now ?-CLIP for outpatient placement- have heard will be [facility]-  will change his dialysis schedule eventually  ?-high risk for intubation esp w/ bipap dependency-per PCCM ?-Continue to monitor daily Cr, Dose meds for GFR<15 ?-Maintain MAP>65 for optimal renal perfusion.   ?-Respiratory management per CCM ?-Avoid nephrotoxic medications including NSAIDs and iodinated intravenous  contrast exposure unless the latter is absolutely indicated.  Preferred narcotic agents for pain control are hydromorphone, fentanyl, and methadone. Morphine should not be used. Avoid Baclofen and avoid oral sodium phosphate and magnesium citrate based laxatives / bowel preps. Continue strict Input and Output monitoring. Will monitor the patient closely with you and intervene or adjust therapy as indicated by changes in clinical status/labs  ? ?Hypertension, stable ?MAP <65 ?-Hydral TID, imdur ? ?Rt pleural effusion ?-s/p thora 3/22>transudative ? ?Anemia of CKD, stable ?-transfuse PRN for hgb <7 ?-hgb currently at goal, not on ESA yet ? ?CKD-MBD ?-phoslo to 2 tabs tidac-  most recent phos OK, c/w calcitriol for low calcium-  once discharged can change it to with dialysis dosing. Renal diet. PTH was 81 ?  ?T2DM ?-manage per primary team ? ?Confusion ?Waxing and waning. Today appears sleepy but responding appropriately.  ? ?Gerlene Fee, DO ?07/31/2021, 8:09 AM ?PGY-3, [place] ? ?Patient seen and examined, agree with above note with above modifications. Seen in dialysis-  still not quite right mentally for unclear reasons-  have on MWF schedule AVF is going well.  Is set up as OP TTS-  will need to change schedule when gets close to discharge-  appropriate adjustment of HD related meds  ?Corliss Parish, MD ?07/31/2021 ? ? ? ? ? ? ? ? ? ?

## 2021-07-31 NOTE — Care Management Important Message (Signed)
Important Message ? ?Patient Details  ?Name: Melvin Mcclure ?MRN: [id number] ?Date of Birth: 1950/08/26 ? ? ?Medicare Important Message Given:  Yes ? ? ? ? ?Levada Dy  Norlene Lanes-Martin ?07/31/2021, 12:19 PM ?

## 2021-07-31 NOTE — Progress Notes (Signed)
Occupational Therapy Treatment ?Patient Details ?Name: Melvin Mcclure ?MRN: [id number] ?DOB: 1950/08/23 ?Today's Date: 07/31/2021 ? ? ?History of present illness 71 year old gentleman admitted 3/20 who presents to the hospital for worsening shortness of breath.  Hx of pleural effusion. Pt underwent thoracentesis on 3/21.  On 3/22 had bronchoscopy w/ RLL.  On 3/23 began Intermittent HD. Has been on Bipap and was on 15L HFNC on PT evaluation on 3/25.  PMH:  CKD 5, HFpEF, type 2 diabetes, strongyloidiasis infection status post ivermectin treatment ?  ?OT comments ? Pt making good progression towards goals this session, pt oriented to self and place at this time, requires increased time and cuing for single step instructions. Pt demonstrates slow processing and decreased initiation when completing tasks. Pt able to perform bed mobility with mod A, possibly related to cognition as pt rolls to R side independently, max A to roll to L side. Per previous therapy note, opted for maximove to chair, from there, pt able to stand and take small steps in place using RW min A +2 with multimodal cuing. Pt presenting with impairments listed below, will follow acutely. Continue to recommend AIR at d/c.  ? ?Recommendations for follow up therapy are one component of a multi-disciplinary discharge planning process, led by the attending physician.  Recommendations may be updated based on patient status, additional functional criteria and insurance authorization. ?   ?Follow Up Recommendations ? Acute inpatient rehab (3hours/day)  ?  ?Assistance Recommended at Discharge Frequent or constant Supervision/Assistance  ?Patient can return home with the following ? Two people to help with walking and/or transfers;Two people to help with bathing/dressing/bathroom;Assistance with feeding;Help with stairs or ramp for entrance ?  ?Equipment Recommendations ? None recommended by OT;Other (comment) (defer to next venue of care)  ?  ?Recommendations for  Other Services   ? ?  ?Precautions / Restrictions Precautions ?Precautions: Fall ?Precaution Comments: monitor O2 ?Restrictions ?Weight Bearing Restrictions: No  ? ? ?  ? ?Mobility Bed Mobility ?Overal bed mobility: Needs Assistance ?Bed Mobility: Rolling ?Rolling: Mod assist ?  ?  ?  ?  ?General bed mobility comments: rolls to R side independently, max A to roll to L side ?  ? ?Transfers ?Overall transfer level: Needs assistance ?Equipment used: Ambulation equipment used ?Transfers: Sit to/from Stand, Bed to chair/wheelchair/BSC ?Sit to Stand: Min assist, +2 physical assistance ?  ?  ?  ?  ?  ?  ?Transfer via Lift Equipment: [place] ?  ?Balance Overall balance assessment: Needs assistance ?Sitting-balance support: Bilateral upper extremity supported, No upper extremity supported, Feet supported ?Sitting balance-Leahy Scale: Fair ?Sitting balance - Comments: sits up in chair without use of back rest support ?  ?Standing balance support: Bilateral upper extremity supported, During functional activity ?Standing balance-Leahy Scale: Poor ?Standing balance comment: stands with RW support ?  ?  ?  ?  ?  ?  ?  ?  ?  ?  ?  ?   ? ?ADL either performed or assessed with clinical judgement  ? ?ADL Overall ADL's : Needs assistance/impaired ?  ?  ?Grooming: Set up;Sitting ?Grooming Details (indicate cue type and reason): wash face in sitting ?  ?  ?  ?  ?  ?  ?Lower Body Dressing: Maximal assistance;Sitting/lateral leans ?Lower Body Dressing Details (indicate cue type and reason): to don socks ?  ?  ?  ?  ?  ?  ?  ?  ?  ? ?Extremity/Trunk Assessment Upper Extremity Assessment ?Upper Extremity Assessment: Generalized  weakness ?  ?Lower Extremity Assessment ?Lower Extremity Assessment: Defer to PT evaluation ?  ?  ?  ? ?Vision   ?Vision Assessment?: No apparent visual deficits ?  ?Perception Perception ?Perception: Not tested ?  ?Praxis Praxis ?Praxis: Not tested ?  ? ?Cognition Arousal/Alertness: Awake/alert, Lethargic ?Behavior  During Therapy: Flat affect ?Overall Cognitive Status: Impaired/Different from baseline ?Area of Impairment: Orientation, Attention, Memory, Following commands, Safety/judgement, Awareness, Problem solving ?  ?  ?  ?  ?  ?  ?  ?  ?Orientation Level: Disoriented to, Time, Situation ?Current Attention Level: Selective ?  ?Following Commands: Follows one step commands inconsistently, Follows one step commands with increased time ?Safety/Judgement: Decreased awareness of safety, Decreased awareness of deficits ?Awareness: Intellectual ?Problem Solving: Slow processing, Decreased initiation, Difficulty sequencing, Requires verbal cues, Requires tactile cues ?General Comments: Delayed response time needing repetition of commands; inconsistent following of commands; pulling at pulse ox probe on finger; confused, garbled speech; often saying "no" ?  ?  ?   ?Exercises   ? ?  ?Shoulder Instructions   ? ? ?  ?General Comments pt's family member in room, present during session, SpO2 with difficulty read, pt asymptomatic, reapplied O2 at end of session  ? ? ?Pertinent Vitals/ Pain       Pain Assessment ?Pain Assessment: No/denies pain ? ?Home Living   ?  ?  ?  ?  ?  ?  ?  ?  ?  ?  ?  ?  ?  ?  ?  ?  ?  ?  ? ?  ?Prior Functioning/Environment    ?  ?  ?  ?   ? ?Frequency ? Min 2X/week  ? ? ? ? ?  ?Progress Toward Goals ? ?OT Goals(current goals can now be found in the care plan section) ? Progress towards OT goals: Progressing toward goals ? ?Acute Rehab OT Goals ?Patient Stated Goal: none stated ?OT Goal Formulation: With patient/family ?Time For Goal Achievement: 08/12/21 ?Potential to Achieve Goals: Good ?ADL Goals ?Pt Will Perform Eating: with min assist;sitting ?Pt Will Perform Grooming: with min assist;sitting ?Pt Will Transfer to Toilet: with mod assist;stand pivot transfer;bedside commode ?Additional ADL Goal #1: Pt to demonstrate ability to sit EOB > 7 min during functional tasks with no more than min guard to maintain  balance ?Additional ADL Goal #2: Pt to demonstrate ability to follow one step commands >75% of the time  ?Plan Discharge plan remains appropriate;Frequency needs to be updated   ? ?Co-evaluation ? ? ? PT/OT/SLP Co-Evaluation/Treatment: Yes ?Reason for Co-Treatment: Complexity of the patient's impairments (multi-system involvement);For patient/therapist safety ?  ?OT goals addressed during session: ADL's and self-care ?  ? ?  ?AM-PAC OT "6 Clicks" Daily Activity     ?Outcome Measure ? ? Help from another person eating meals?: A Little ?Help from another person taking care of personal grooming?: A Little ?Help from another person toileting, which includes using toliet, bedpan, or urinal?: A Lot ?Help from another person bathing (including washing, rinsing, drying)?: A Lot ?Help from another person to put on and taking off regular upper body clothing?: A Lot ?Help from another person to put on and taking off regular lower body clothing?: A Lot ?6 Click Score: 14 ? ?  ?End of Session Equipment Utilized During Treatment: Gait belt;Rolling walker (2 wheels);Oxygen ? ?OT Visit Diagnosis: Unsteadiness on feet (R26.81);Other abnormalities of gait and mobility (R26.89);Muscle weakness (generalized) (M62.81);Other symptoms and signs involving cognitive function ?  ?Activity  Tolerance Other (comment) ?  ?Patient Left in chair;with call bell/phone within reach;with chair alarm set;with nursing/sitter in room ?  ?Nurse Communication Mobility status ?  ? ?   ? ?Time: 3435-6861 ?OT Time Calculation (min): 25 min ? ?Charges: OT General Charges ?$OT Visit: 1 Visit ?OT Treatments ?$Therapeutic Activity: 8-22 mins ? ?Lynnda Child, OTD, OTR/L ?Acute Rehab ?[phone] ? ? ?Kaylyn Lim ?07/31/2021, 4:14 PM ?

## 2021-07-31 NOTE — Progress Notes (Signed)
Spoke to pt's wife via phone. Discussed out-pt HD arrangements at [facility] on TTS (7:00 arrival for 7:20 chair time). Will continue to follow and assist with out-pt HD needs while pt remains inpt.  ? ?Melven Sartorius ?Renal Navigator ?[phone] ?

## 2021-08-01 DIAGNOSIS — J9621 Acute and chronic respiratory failure with hypoxia: Secondary | ICD-10-CM | POA: Diagnosis not present

## 2021-08-01 DIAGNOSIS — G9341 Metabolic encephalopathy: Secondary | ICD-10-CM | POA: Diagnosis not present

## 2021-08-01 DIAGNOSIS — E1100 Type 2 diabetes mellitus with hyperosmolarity without nonketotic hyperglycemic-hyperosmolar coma (NKHHC): Secondary | ICD-10-CM | POA: Diagnosis not present

## 2021-08-01 DIAGNOSIS — I10 Essential (primary) hypertension: Secondary | ICD-10-CM | POA: Diagnosis not present

## 2021-08-01 LAB — GLUCOSE, CAPILLARY
Glucose-Capillary: 153 mg/dL — ABNORMAL HIGH (ref 70–99)
Glucose-Capillary: 151 mg/dL — ABNORMAL HIGH (ref 70–99)
Glucose-Capillary: 147 mg/dL — ABNORMAL HIGH (ref 70–99)
Glucose-Capillary: 138 mg/dL — ABNORMAL HIGH (ref 70–99)

## 2021-08-01 LAB — RENAL FUNCTION PANEL
Albumin: 3.5 g/dL (ref 3.5–5.0)
Anion gap: 18 — ABNORMAL HIGH (ref 5–15)
BUN: 63 mg/dL — ABNORMAL HIGH (ref 8–23)
CO2: 25 mmol/L (ref 22–32)
Calcium: 8.9 mg/dL (ref 8.9–10.3)
Chloride: 90 mmol/L — ABNORMAL LOW (ref 98–111)
Creatinine, Ser: 7.14 mg/dL — ABNORMAL HIGH (ref 0.61–1.24)
GFR, Estimated: 8 mL/min — ABNORMAL LOW (ref >60)
Glucose, Bld: 151 mg/dL — ABNORMAL HIGH (ref 70–99)
Phosphorus: 5.8 mg/dL — ABNORMAL HIGH (ref 2.5–4.6)
Potassium: 4.7 mmol/L (ref 3.5–5.1)
Sodium: 133 mmol/L — ABNORMAL LOW (ref 135–145)

## 2021-08-01 MED ORDER — CHLORHEXIDINE GLUCONATE CLOTH 2 % EX PADS: 6.0000 | MEDICATED_PAD | Freq: Every day | CUTANEOUS | Status: DC

## 2021-08-01 NOTE — Progress Notes (Signed)
Physical Therapy Treatment ?Patient Details ?Name: Melvin Mcclure ?MRN: [id number] ?DOB: 1951-04-14 ?Today's Date: 08/01/2021 ? ? ?History of Present Illness Pt is a 71 y.o. male admitted 07/22/21 with worsening SOB; workup for acute on chronic respiratory failure with recurrent pleural effusion initially requiring bipap. S/p thoracentesis 3/21. S/p RLL bronchoscopy, bronchoalveolar lavage, transbronchial lung biopsy 3/22. Course complicated by AKI on CKD; iHD initiated 3/23. Pt also with acute methabolic encephalopathy ; question ICU delirium vs uremia. PMH includes CKD 5 (undergoing transplant evaluation), HFpEF, DM2. ?  ?PT Comments  ? ? Pt progressing with mobility. Today's session focused on transfer and gait training with RW; pt requiring up to [facility] for mobility, as well as increased time and significant verbal cues due to slowed processing, difficulty problem solving, poor attention and decreased awareness. Pt motivated to participate and seems pleased with ability to ambulate into hallway today with chair follow. Pt remains limited by generalized weakness, decreased activity tolerance, poor balance strategies/postural reactions and impaired cognition. Continue to recommend intensive AIR-level therapies to maximize functional mobility and independence prior to return home. ? ?Post-ambulation BP 127/75, HR 91, SpO2 96% on RA ?   ?Recommendations for follow up therapy are one component of a multi-disciplinary discharge planning process, led by the attending physician.  Recommendations may be updated based on patient status, additional functional criteria and insurance authorization. ? ?Follow Up Recommendations ? Acute inpatient rehab (3hours/day) ?  ?  ?Assistance Recommended at Discharge Frequent or constant Supervision/Assistance  ?Patient can return home with the following A little help with walking and/or transfers;A little help with bathing/dressing/bathroom;Assistance with cooking/housework;Direct  supervision/assist for medications management;Direct supervision/assist for financial management;Help with stairs or ramp for entrance ?  ?Equipment Recommendations ? Rolling walker (2 wheels);BSC/3in1;Wheelchair (measurements PT);Wheelchair cushion (measurements PT) - if home ?  ?Recommendations for Other Services   ? ? ?  ?Precautions / Restrictions Precautions ?Precautions: Fall;Other (comment) ?Precaution Comments: Watch SpO2 (per sister-in-law, wears PRN at home); urine incontinence ?Restrictions ?Weight Bearing Restrictions: No  ?  ? ?Mobility ? Bed Mobility ?Overal bed mobility: Needs Assistance ?Bed Mobility: Supine to Sit ?  ?  ?Supine to sit: Supervision, HOB elevated (maxA cues) ?  ?  ?General bed mobility comments: repeated, max verbal cues to sit on L-side EOB, pt asking, "Can I get up now?" then starting to go to R-side despite rail being up and therapist pointing to L-side; eventually able to come to sitting with supervision ?  ? ?Transfers ?Overall transfer level: Needs assistance ?Equipment used: None, Rolling walker (2 wheels) ?Transfers: Sit to/from Stand ?Sit to Stand: Min assist, Mod assist ?  ?Step pivot transfers: Mod assist ?  ?  ?  ?General transfer comment: Initial stand from EOB without DME, pt reliant on minA for trunk elevation and HHA to maintain balance, pivotal steps to recliner with modA for stability; additional sit<>stand from recliner to RW, reliant on momentum, minA; mod verbal cues for sequencing ?  ? ?Ambulation/Gait ?Ambulation/Gait assistance: Min assist, Mod assist, +2 safety/equipment (modA verbal cues) ?Gait Distance (Feet): 22 Feet ?Assistive device: Rolling walker (2 wheels) ?Gait Pattern/deviations: Step-through pattern, Decreased stride length, Trunk flexed ?Gait velocity: Decreased ?  ?  ?General Gait Details: Slow, unsteady gait with RW and min-modA for stability, RW management and mod verbal cues for sequencing, including upright posture and maintaining closer  proximity to RW; +2 assist for chair follow (pt informed of this), pt continuing to point to various chairs asking if he can sit there ? ? ?  Stairs ?  ?  ?  ?  ?  ? ? ?Wheelchair Mobility ?  ? ?Modified Rankin (Stroke Patients Only) ?  ? ? ?  ?Balance Overall balance assessment: Needs assistance ?Sitting-balance support: Bilateral upper extremity supported, No upper extremity supported, Feet supported ?Sitting balance-Leahy Scale: Fair ?  ?  ?Standing balance support: Bilateral upper extremity supported, During functional activity ?Standing balance-Leahy Scale: Poor ?Standing balance comment: reliant on UE support and external assist ?  ?  ?  ?  ?  ?  ?  ?  ?  ?  ?  ?  ? ?  ?Cognition Arousal/Alertness: Awake/alert ?Behavior During Therapy: Flat affect ?Overall Cognitive Status: Impaired/Different from baseline ?Area of Impairment: Orientation, Attention, Memory, Following commands, Safety/judgement, Awareness, Problem solving ?  ?  ?  ?  ?  ?  ?  ?  ?Orientation Level: Disoriented to, Time, Situation ?Current Attention Level: Sustained ?Memory: Decreased recall of precautions, Decreased short-term memory ?Following Commands: Follows one step commands inconsistently, Follows one step commands with increased time ?Safety/Judgement: Decreased awareness of safety, Decreased awareness of deficits ?Awareness: Intellectual ?Problem Solving: Slow processing, Decreased initiation, Difficulty sequencing, Requires verbal cues, Requires tactile cues ?General Comments: More alert and participatory; still with delayed response time, difficulty motor planning (?), discussing unrelated info in related to specific questions/commands from therapist, poor short-term memory. perseverating on fact that his friend visiting was in hall of fame for tennis ?  ?  ? ?  ?Exercises   ? ?  ?General Comments General comments (skin integrity, edema, etc.): pt c/o dizziness post-ambulation, BP 127/75, HR 91, SpO2 96% on RA. pt's friend (J.W.)  present and supportive ?  ?  ? ?Pertinent Vitals/Pain Pain Assessment ?Pain Assessment: No/denies pain ?Pain Intervention(s): Monitored during session  ? ? ?Home Living   ?  ?  ?  ?  ?  ?  ?  ?  ?  ?   ?  ?Prior Function    ?  ?  ?   ? ?PT Goals (current goals can now be found in the care plan section) Progress towards PT goals: Progressing toward goals ? ?  ?Frequency ? ? ? Min 4X/week ? ? ? ?  ?PT Plan Frequency needs to be updated  ? ? ?Co-evaluation   ?  ?  ?  ?  ? ?  ?AM-PAC PT "6 Clicks" Mobility   ?Outcome Measure ? Help needed turning from your back to your side while in a flat bed without using bedrails?: A Little ?Help needed moving from lying on your back to sitting on the side of a flat bed without using bedrails?: A Little ?Help needed moving to and from a bed to a chair (including a wheelchair)?: A Lot ?Help needed standing up from a chair using your arms (e.g., wheelchair or bedside chair)?: A Little ?Help needed to walk in hospital room?: Total ?Help needed climbing 3-5 steps with a railing? : Total ?6 Click Score: 13 ? ?  ?End of Session Equipment Utilized During Treatment: Gait belt ?Activity Tolerance: Patient tolerated treatment well ?Patient left: in chair;with call bell/phone within reach;with chair alarm set;with family/visitor present ?Nurse Communication: Mobility status ?PT Visit Diagnosis: Unsteadiness on feet (R26.81);Muscle weakness (generalized) (M62.81) ?  ? ? ?Time: 9147-8295 ?PT Time Calculation (min) (ACUTE ONLY): 28 min ? ?Charges:  $Gait Training: 8-22 mins ?$Therapeutic Activity: 8-22 mins          ?          ? ?  Mabeline Caras, PT, DPT ?Acute Rehabilitation Services  ?Pager [phone] ?Office [phone] ? ?Derry Lory ?08/01/2021, 12:50 PM ? ?

## 2021-08-01 NOTE — Progress Notes (Signed)
Speech Language Pathology Treatment: Dysphagia  ?Patient Details ?Name: Melvin Mcclure ?MRN: [id number] ?DOB: March 05, 1951 ?Today's Date: 08/01/2021 ?Time: 4917-9150 ?SLP Time Calculation (min) (ACUTE ONLY): 11 min ? ?Assessment / Plan / Recommendation ?Clinical Impression ? Pt alert and cooperative, mildly delayed responses and confusion however improved from previous session. There were no concerns with airway intrusion with regular or straw sips thin today and swallow appeared timely from observation. He did place the whole 1/2 cracker in his mouth and said "I can't believe I ate all the cracker." Recommend continue regular/thin and intermittent supervision. No further ST needed at this time.  ?  ?HPI HPI: Pt is a 71 yo male presenting with worsening shortness of breath (on 2L O2 at home), admitted with acute hypoxemic and hypercapneic respiratory failure requiring intermittent BiPAP. Previous swallow eval in June 2021 with oropharyngeal swallow that appeared to be [facility]. PMH includes: CKD 5, HFpEF, type 2 diabetes, strongyloidiasis infection status post ivermectin treatment ?  ?   ?SLP Plan ? All goals met;Discharge SLP treatment due to (comment) ? ?  ?  ?Recommendations for follow up therapy are one component of a multi-disciplinary discharge planning process, led by the attending physician.  Recommendations may be updated based on patient status, additional functional criteria and insurance authorization. ?  ? ?Recommendations  ?Diet recommendations: Regular;Thin liquid ?Liquids provided via: Straw;Cup ?Medication Administration: Whole meds with liquid ?Supervision: Patient able to self feed ?Compensations: Minimize environmental distractions;Slow rate;Small sips/bites ?Postural Changes and/or Swallow Maneuvers: Seated upright 90 degrees  ?   ?    ?   ? ? ? ? Oral Care Recommendations: Oral care BID ?Follow Up Recommendations: No SLP follow up ?Assistance recommended at discharge: Intermittent  Supervision/Assistance ?SLP Visit Diagnosis: Dysphagia, unspecified (R13.10) ?Plan: All goals met;Discharge SLP treatment due to (comment) ? ? ? ? ?  ?  ? ? ?Houston Siren ? ?08/01/2021, 11:01 AM ?

## 2021-08-01 NOTE — PMR Pre-admission (Shared)
PMR Admission Coordinator Pre-Admission Assessment ? ?Patient: Melvin Mcclure is an 71 y.o., male ?MRN: [id number] ?DOB: May 26, 1950 ?Height: _0  (177.8 cm) ?Weight: 86 kg ? ?Insurance Information ?HMO: Yes - Gold Plus    PPO:       PCP:       IPA:       80/20:       OTHER:   ?PRIMARY: [employer]      Policy#: [id number]      Subscriber: Pt  ?CM Name: ***      Phone#: ***     Fax#: [phone] ?Pre-Cert#: [id number]      Employer: Retired ?Benefits:  Phone #: [phone]     Name: online at [email or web address] ?Eff. Date: 05/05/21     Deduct: $0      Out of Pocket Max: $3400 (met $439.10)      Life Max: N/A ?CIR: $295/day days 1-6 with max $1770/admission      SNF: $0 days 1-20; $196 days 21-100 ?Outpatient: [facility]     Co-Pay: $20/visit ?Home Health: 100%      Co-Pay: none ?DME: 80%     Co-Pay: 20% ?Providers: in network ? ?SECONDARY:       Policy#:      Phone#:  ? ?Financial Counselor:       Phone#:  ? ?The ?Data Collection Information Summary? for patients in Inpatient Rehabilitation Facilities with attached ?Privacy Act [place] Records? was provided and verbally reviewed with: Patient ? ?Emergency Contact Information ?Contact Information   ? ? Name Relation Home Work Mobile  ? [name], [name] Spouse [phone]  [phone]  ? ?  ? ? ?Current Medical History  ?Patient Admitting Diagnosis: Respiratory Failure, Renal Failure  ? ?History of Present Illness: Pt is a 71 y.o. male admitted 07/22/21 with worsening SOB; workup for acute on chronic respiratory failure with recurrent pleural effusion initially requiring bipap. S/p thoracentesis 3/21. S/p RLL bronchoscopy, bronchoalveolar lavage, transbronchial lung biopsy 3/22. Course complicated by AKI on CKD; iHD initiated 3/23. Pt also with acute methabolic encephalopathy ; question ICU delirium vs uremia. PMH includes CKD 5 (undergoing transplant evaluation), HFpEF, DM2. ?  ? ?Patient's medical record from [facility]  has been reviewed by  the rehabilitation admission coordinator and physician. ? ?Past Medical History  ?Past Medical History:  ?Diagnosis Date  ? Anemia in chronic kidney disease (CKD)   ? stage 5  ? Anxiety   ? Arthritis   ? CHF (congestive heart failure) ([place]) 05/28/2018  ? dx 05/28/18  ? Diabetes mellitus without complication ([place])   ? Hyperlipidemia   ? Hypertension   ? Iron deficiency anemia   ? Neuromuscular disorder ([place])   ? neuropathy feet/hands  ? Perforating neurotrophic ulcer of foot ([place])   ? Stage 4 chronic kidney disease ([place])   ? ? ?Has the patient had major surgery during 100 days prior to admission? Yes ? ?Family History   ?family history includes Diabetes Mellitus II in his mother; Hypertension in his father. ? ?Current Medications ? ?Current Facility-Administered Medications:  ?  acetaminophen (TYLENOL) tablet 650 mg, 650 mg, Oral, Q6H PRN, 650 mg at 07/27/21 1443 **OR** acetaminophen (TYLENOL) suppository 650 mg, 650 mg, Rectal, Q6H PRN, Karmen Bongo, MD ?  albuterol (PROVENTIL) (2.5 MG/3ML) 0.083% nebulizer solution 2.5 mg, 2.5 mg, Nebulization, Q2H PRN, Karmen Bongo, MD ?  amoxicillin-clavulanate (AUGMENTIN) 500-125 MG per tablet 500 mg, 1 tablet, Oral, Q2000, Candee Furbish, MD, 500 mg at 08/01/21 2038 ?  aspirin chewable tablet 81 mg, 81 mg, Oral, Daily, Karmen Bongo, MD, 81 mg at 08/01/21 1015 ?  calcitRIOL (ROCALTROL) capsule 0.25 mcg, 0.25 mcg, Oral, Cathlean Sauer, MD, 0.25 mcg at 07/31/21 1235 ?  calcium acetate (PHOSLO) capsule 1,334 mg, 1,334 mg, Oral, TID WC, Gean Quint, MD, 1,334 mg at 08/01/21 1644 ?  calcium carbonate (dosed in mg elemental calcium) suspension 500 mg of elemental calcium, 500 mg of elemental calcium, Oral, Q6H PRN, Karmen Bongo, MD ?  calcium carbonate (TUMS - dosed in mg elemental calcium) chewable tablet 400 mg of elemental calcium, 2 tablet, Oral, QHS, Karmen Bongo, MD, 400 mg of elemental calcium at 08/01/21 2223 ?  Chlorhexidine Gluconate Cloth 2 % PADS 6  each, 6 each, Topical, Daily, Pokhrel, Laxman, MD, 6 each at 08/01/21 1247 ?  Chlorhexidine Gluconate Cloth 2 % PADS 6 each, 6 each, Topical, Q0600, Corliss Parish, MD, 6 each at 08/02/21 0600 ?  docusate sodium (COLACE) capsule 100 mg, 100 mg, Oral, BID, Candee Furbish, MD, 100 mg at 08/01/21 2223 ?  docusate sodium (ENEMEEZ) enema 283 mg, 1 enema, Rectal, PRN, Karmen Bongo, MD ?  feeding supplement (NEPRO CARB STEADY) liquid 237 mL, 237 mL, Oral, BID BM, Candee Furbish, MD, 237 mL at 07/31/21 1236 ?  haloperidol lactate (HALDOL) injection 5 mg, 5 mg, Intravenous, Q6H PRN, Candee Furbish, MD, 5 mg at 07/31/21 0277 ?  heparin injection 1,700 Units, 20 Units/kg, Dialysis, PRN, Corliss Parish, MD ?  heparin injection 5,000 Units, 5,000 Units, Subcutaneous, Q8H, Karmen Bongo, MD, 5,000 Units at 08/02/21 4128 ?  hydrALAZINE (APRESOLINE) injection 5 mg, 5 mg, Intravenous, Q4H PRN, Karmen Bongo, MD ?  hydrALAZINE (APRESOLINE) tablet 50 mg, 50 mg, Oral, TID, Karmen Bongo, MD, 50 mg at 08/01/21 2222 ?  HYDROcodone-acetaminophen (NORCO/VICODIN) 5-325 MG per tablet 1 tablet, 1 tablet, Oral, Q4H PRN, Karmen Bongo, MD ?  insulin aspart (novoLOG) injection 0-5 Units, 0-5 Units, Subcutaneous, QHS, Karmen Bongo, MD, 2 Units at 07/25/21 2155 ?  insulin aspart (novoLOG) injection 0-6 Units, 0-6 Units, Subcutaneous, TID WC, Karmen Bongo, MD, 1 Units at 08/01/21 1224 ?  insulin glargine-yfgn (SEMGLEE) injection 20 Units, 20 Units, Subcutaneous, Daily, Candee Furbish, MD, 20 Units at 08/01/21 1019 ?  isosorbide mononitrate (IMDUR) 24 hr tablet 60 mg, 60 mg, Oral, QPM, Karmen Bongo, MD, 60 mg at 08/01/21 1644 ?  meclizine (ANTIVERT) tablet 25 mg, 25 mg, Oral, BID, Karmen Bongo, MD, 25 mg at 08/01/21 2227 ?  MEDLINE mouth rinse, 15 mL, Mouth Rinse, BID, Pokhrel, Laxman, MD, 15 mL at 08/01/21 2224 ?  ondansetron (ZOFRAN) tablet 4 mg, 4 mg, Oral, Q6H PRN **OR** ondansetron (ZOFRAN) injection 4 mg,  4 mg, Intravenous, Q6H PRN, Karmen Bongo, MD, 4 mg at 07/31/21 0224 ?  phenol (CHLORASEPTIC) mouth spray 1 spray, 1 spray, Mouth/Throat, PRN, Pokhrel, Laxman, MD, 1 spray at 07/24/21 1444 ?  polyethylene glycol (MIRALAX / GLYCOLAX) packet 17 g, 17 g, Oral, Daily, Candee Furbish, MD, 17 g at 08/01/21 1014 ?  senna-docusate (Senokot-S) tablet 1 tablet, 1 tablet, Oral, BID PRN, Karmen Bongo, MD ?  senna-docusate (Senokot-S) tablet 1 tablet, 1 tablet, Oral, QHS, Candee Furbish, MD, 1 tablet at 08/01/21 2223 ?  sodium chloride flush (NS) 0.9 % injection 3 mL, 3 mL, Intravenous, Q12H, Karmen Bongo, MD, 3 mL at 08/01/21 2224 ?  sorbitol 70 % solution 30 mL, 30 mL, Oral, PRN, Karmen Bongo, MD ?  zolpidem Lorrin Mais) tablet  5 mg, 5 mg, Oral, QHS PRN, Karmen Bongo, MD, 5 mg at 08/01/21 0035 ? ?Patients Current Diet:  ?Diet Order   ? ?       ?  Diet renal/carb modified with fluid restriction Diet-HS Snack? Nothing; Fluid restriction: 1200 mL Fluid; Room service appropriate? Yes with Assist; Fluid consistency: Thin  Diet effective now       ?  ? ?  ?  ? ?  ? ? ?Precautions / Restrictions ?Precautions ?Precautions: Fall, Other (comment) ?Precaution Comments: Watch SpO2 (per sister-in-law, wears PRN at home); urine incontinence ?Restrictions ?Weight Bearing Restrictions: No  ? ?Has the patient had 2 or more falls or a fall with injury in the past year? No ? ?Prior Activity Level ?Community (5-7x/wk): Pt. active in the community PTA ? ?Prior Functional Level ?Self Care: Did the patient need help bathing, dressing, using the toilet or eating? Independent ? ?Indoor Mobility: Did the patient need assistance with walking from room to room (with or without device)? Independent ? ?Stairs: Did the patient need assistance with internal or external stairs (with or without device)? Independent ? ?Functional Cognition: Did the patient need help planning regular tasks such as shopping or remembering to take medications?  Independent ? ?Patient Information ?Are you of Hispanic, Latino/a,or Spanish origin?: A. No, not of Hispanic, Latino/a, or Spanish origin ?What is your race?: B. Black or African American ?Do you need or wa

## 2021-08-01 NOTE — Progress Notes (Addendum)
Admit: 07/22/2021 ?LOS: 9 ? ?Melvin Mcclure is a/an 71 y.o. male with a past medical history notable for renal failure (transplant candidate; left brachiocephalic AV fistula created 95/62/13 ), diastolic CHF, HTN, Y8MV who presents with acute hypoxic respiratory failure.    ? ?Subjective: States he feels ok. He says his wife left to get something from their residence. He is asking appropriate questions and making jokes today. He does admit to be hard of hearing. He states he tolerated HD yesterday well. ? ?03/29 0701 - 03/30 0700 ?In: 1 [P.O.:60] ?Out: 1954 [Urine:50] ? ?Filed Weights  ? 07/31/21 1131 07/31/21 1228 08/01/21 0600  ?Weight: 86 kg 88.6 kg 86 kg  ? ? ?Scheduled Meds: ? amoxicillin-clavulanate  1 tablet Oral Q2000  ? aspirin  81 mg Oral Daily  ? calcitRIOL  0.25 mcg Oral QODAY  ? calcium acetate  1,334 mg Oral TID WC  ? calcium carbonate  2 tablet Oral QHS  ? Chlorhexidine Gluconate Cloth  6 each Topical Daily  ? Chlorhexidine Gluconate Cloth  6 each Topical Q0600  ? docusate sodium  100 mg Oral BID  ? feeding supplement (NEPRO CARB STEADY)  237 mL Oral BID BM  ? heparin  5,000 Units Subcutaneous Q8H  ? hydrALAZINE  50 mg Oral TID  ? insulin aspart  0-5 Units Subcutaneous QHS  ? insulin aspart  0-6 Units Subcutaneous TID WC  ? insulin glargine-yfgn  20 Units Subcutaneous Daily  ? isosorbide mononitrate  60 mg Oral QPM  ? meclizine  25 mg Oral BID  ? mouth rinse  15 mL Mouth Rinse BID  ? polyethylene glycol  17 g Oral Daily  ? senna-docusate  1 tablet Oral QHS  ? sodium chloride flush  3 mL Intravenous Q12H  ? ?Continuous Infusions: ? ? ?PRN Meds:.acetaminophen **OR** acetaminophen, albuterol, calcium carbonate (dosed in mg elemental calcium), docusate sodium, haloperidol lactate, hydrALAZINE, HYDROcodone-acetaminophen, ondansetron **OR** ondansetron (ZOFRAN) IV, phenol, senna-docusate, sorbitol, zolpidem ? ? ?Physical Exam:  Blood pressure 121/73, pulse 90, temperature 98 ?F (36.7 ?C), temperature source  Oral, resp. rate 17, height '5\' 10"'$  (1.778 m), weight 86 kg, SpO2 97 %. ?General: Appears to be improving, no acute distress. Age appropriate. ?Cardiac: RRR, normal heart sounds, no murmurs ?Respiratory: CTAB, normal effort ?Abdomen: soft, nontender, nondistended ?Extremities: No LE edema ?Neuro: alert and oriented to self and person ?Psych: joking mood ?Dialysis access: LUE AVF +b/t ? ?Recent Labs  ?Lab 07/29/21 ?0804 07/30/21 ?7846 07/31/21 ?0150 08/01/21 ?0939  ?NA 135 134* 134* 133*  ?K 4.0 4.0 4.0 4.7  ?CL 99 93* 94* 90*  ?CO2 '25 26 24 25  '$ ?GLUCOSE 99 112* 112* 151*  ?BUN 67* 47* 66* 63*  ?CREATININE 6.09* 5.91* 7.24* 7.14*  ?CALCIUM 7.1* 8.4* 8.5* 8.9  ?PHOS 4.3 4.9*  --  5.8*  ? ?Recent Labs  ?Lab 07/29/21 ?0804 07/30/21 ?9629 07/31/21 ?0150  ?WBC 9.9 12.3* 13.3*  ?HGB 11.2* 14.3 13.9  ?HCT 35.2* 43.6 42.4  ?MCV 86.9 84.8 84.0  ?PLT 178 236 274  ? ? ?A/P ?Melvin Mcclure is a/an 71 y.o. male with a past medical history notable for renal failure ( left brachiocephalic AV fistula created 52/84/13 ), diastolic CHF, HTN, K4MW.   ?  ?Acute hypoxic respiratory failure ?Hypervolemia 2/2 AoC CKD5, worsening  Now ESRD ?H/o CHF ?-started HD1 3/23 due to uremia and inadequate diuresis with high dose lasix.  Next treatment tomorrow, will maintain MWF schedule for now ?-CLIP for outpatient placement- have heard will  be [facility]-  will change his dialysis schedule eventually  ?-Continue to monitor daily Cr, Dose meds for GFR<15 ?-Maintain MAP>65 for optimal renal perfusion.   ?-Respiratory management per primary ?-Avoid nephrotoxic medications including NSAIDs and iodinated intravenous contrast exposure unless the latter is absolutely indicated.  Preferred narcotic agents for pain control are hydromorphone, fentanyl, and methadone. Morphine should not be used. Avoid Baclofen and avoid oral sodium phosphate and magnesium citrate based laxatives / bowel preps. Continue strict Input and Output monitoring. Will monitor the  patient closely with you and intervene or adjust therapy as indicated by changes in clinical status/labs  ? ?Hypertension, stable ?MAP <65 ?-Hydral TID, imdur ? ?Rt pleural effusion ?-s/p thora 3/22>transudative ? ?Anemia of CKD, stable ?-transfuse PRN for hgb <7 ?-hgb currently at goal, not on ESA yet ? ?CKD-MBD ?-phoslo to 2 tabs tidac-  most recent phos OK, c/w calcitriol for low calcium-  once discharged can change it to with dialysis dosing. Renal diet. PTH was 81 ?  ?T2DM ?-manage per primary team ? ?Confusion ?Waxing and waning. Today appears sleepy but responding appropriately.  ? ?Gerlene Fee, DO ?08/01/2021, 8:30 AM ?PGY-3, [place] ? ?Patient seen and examined, agree with above note with above modifications. MS is better than it has been. No complaints about dialysis yesterday-  will plan for tomorrow-- other wise appropriate titration of dialysis related medications  ?Corliss Parish, MD ?08/01/2021 ? ? ? ? ? ? ? ? ? ? ?

## 2021-08-01 NOTE — Progress Notes (Signed)
CSW informed that pt wife has questions.  Wife not in pt room, CSW attempted to call, no answer, LM. ?Lurline Idol, MSW, LCSW ?3/30/20231:01 PM  ?

## 2021-08-01 NOTE — Progress Notes (Signed)
IP rehab admissions - I have opened the case with [facility] medicare requesting acute inpatient rehab admission.  I spoke with patient's wife and have updated her on information.  I will update all once I hear back from insurance case manager.  Call for questions.  [phone] ?

## 2021-08-01 NOTE — Progress Notes (Signed)
? ?[facility] ?PROGRESS NOTE ? ? ?Melvin Mcclure ENI:[id number] DOB: 07-Jan-1951 DOA: 07/22/2021  9 ?DOS: the patient was seen and examined on 08/01/2021 ? ?PCP: Nolene Ebbs, MD ? ?Brief History and Hospital Course:  ?71 y.o. 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  Apparently uses 2 L of oxygen by nasal cannula at home as needed.  Patient with history of recurrent pleural effusion.  Seen by pulmonology.  Had high oxygen requirements.  Was treated transferred to the ICU.  Underwent diagnostic and therapeutic thoracocentesis with removal of 1500 MLS of fluid on 07/23/2021.  Patient then underwent flexible bronchoscopy with brushing and bronchoalveolar lavage and transbronchial lung biopsy on 07/24/2021 for nonresolving opacity with recurrent pleural effusion. Required BiPAP.  Then transitioned to high flow nasal cannula.  BiPAP recommended at nighttime by pulmonology which the patient has been refusing.  Subsequently transferred back to floor. ? ? ?Consultants: Pulmonology.  Nephrology ? ?Procedures: Thoracentesis.  Bronchoscopy ? ? ?Subjective: ?Patient much more responsive and alert today compared to yesterday.  Wife is at bedside.  Has been eating his breakfast.   ? ? ?Assessment/Plan: ? ? ?* Acute on chronic respiratory failure with hypoxia ([place]) ?Recurrent pleural effusion ?Chronic bibasilar infiltrate ? ?Likely secondary to recurrent pleural effusion.  Underwent thoracentesis.  Seen by pulmonology.   ?Pleural effusion likely due to combination of fluid overload as well as infection. ?Status post thoracocentesis on  07/23/2021 with removal of 1500 mL of pleural fluid.  Pleural fluid culture negative in 24 hours.  Additionally, patient underwent flexible bronchoscopy with brushing  and bronchoalveolar lavage and transbronchial lung biopsy on 07/24/2021.  ?BAL positive for Prevotella; Continue Augmentin, end date 08/05/21. Follow fungal cultures.   ?Remains afebrile.  WBC minimally elevated.   ?Saturations are in the mid to late 90s.  Continue to wean down oxygen.  Patient refuses BiPAP at night but seems to be doing well without it. ? ?Acute metabolic encephalopathy ?Likely ICU delirium component to confusion/lethargy vs uremia ?Now on hemodialysis.   ?Mentation is gradually improving.  No focal neurological deficits.   ? ?Stage 5 chronic kidney disease ([place]) ?Progression to end-stage renal disease ? ?Progression of kidney disease during this admission.  Nephrology was consulted.  Initially given high-dose furosemide without improvement.  Subsequently started on hemodialysis. ?Currently being dialyzed on a Monday Wednesday Friday schedule. ? ?Chronic diastolic CHF (congestive heart failure) ([place]) ?Volume being managed with hemodialysis now.   ?2D echocardiogram done 07/23/2021 shows preserved LV function with LV ejection fraction of 55 to 60% with no regional wall motion abnormality and grade 2 diastolic dysfunction. ? ?DM2 (diabetes mellitus, type 2) ([place]) ?Noted to be on SSI and glargine.   HbA1c 7.0.   ?CBGs are reasonably well controlled. ? ?HTN (hypertension) ?Blood pressure reasonably well controlled.   ?Noted to be on hydralazine isosorbide mononitrate.   ?Looks like he has been taken off of amlodipine and carvedilol. ? ?Strongyloidiasis ?-Should have been effectively treated with Ivermectin x 2 rounds ?-F/u with ID and/or transplant as needed ? ? ? ?DVT Prophylaxis: Subcutaneous heparin ?Code Status: Full code ?Family Communication: Discussed with patient's wife who was at the bedside. ?Disposition Plan: To be determined.  Inpatient rehabilitation recommended by therapy ? ?Status is: Inpatient ?Remains inpatient appropriate because: Progression to end-stage renal disease, acute respiratory  failure ? ? ? ? ?Medications:  Scheduled: ? amoxicillin-clavulanate  1 tablet Oral Q2000  ? aspirin  81 mg Oral Daily  ? calcitRIOL  0.25 mcg Oral QODAY  ? calcium acetate  1,334 mg Oral TID WC  ? calcium carbonate  2 tablet Oral QHS  ? Chlorhexidine Gluconate Cloth  6 each Topical Daily  ? Chlorhexidine Gluconate Cloth  6 each Topical Q0600  ? docusate sodium  100 mg Oral BID  ? feeding supplement (NEPRO CARB STEADY)  237 mL Oral BID BM  ? heparin  5,000 Units Subcutaneous Q8H  ? hydrALAZINE  50 mg Oral TID  ? insulin aspart  0-5 Units Subcutaneous QHS  ? insulin aspart  0-6 Units Subcutaneous TID WC  ? insulin glargine-yfgn  20 Units Subcutaneous Daily  ? isosorbide mononitrate  60 mg Oral QPM  ? meclizine  25 mg Oral BID  ? mouth rinse  15 mL Mouth Rinse BID  ? polyethylene glycol  17 g Oral Daily  ? senna-docusate  1 tablet Oral QHS  ? sodium chloride flush  3 mL Intravenous Q12H  ? ?Continuous: ? ? ?MIW:OEHOZYYQMGNOI **OR** acetaminophen, albuterol, calcium carbonate (dosed in mg elemental calcium), docusate sodium, haloperidol lactate, hydrALAZINE, HYDROcodone-acetaminophen, ondansetron **OR** ondansetron (ZOFRAN) IV, phenol, senna-docusate, sorbitol, zolpidem ? ?Antibiotics: ?Anti-infectives (From admission, onward)  ? ? Start     Dose/Rate Route Frequency Ordered Stop  ? 07/29/21 1200  amoxicillin-clavulanate (AUGMENTIN) 500-125 MG per tablet 500 mg       ? 1 tablet Oral Daily 07/29/21 0914 08/05/21 1959  ? ?  ? ? ?Objective: ? ?Vital Signs ? ?Vitals:  ? 08/01/21 0441 08/01/21 0443 08/01/21 0600 08/01/21 0758  ?BP:  (!) 152/82  121/73  ?Pulse: 91 92  90  ?Resp: (!) _0 ?Temp:  98.5 ?F (36.9 ?C)  98 ?F (36.7 ?C)  ?TempSrc:  Oral  Oral  ?SpO2: 97% 97%  97%  ?Weight:   86 kg   ?Height:      ? ? ?Intake/Output Summary (Last 24 hours) at 08/01/2021 1014 ?Last data filed at 07/31/2021 2020 ?Gross per 24 hour  ?Intake 60 ml  ?Output 1954 ml  ?Net -1894 ml  ? ? ?Filed Weights  ? 07/31/21 1131 07/31/21 1228  08/01/21 0600  ?Weight: 86 kg 88.6 kg 86 kg  ? ? ?General appearance: Awake alert.  In no distress.  Mildly distracted. ?Resp: Normal effort at rest.  Diminished air entry at the bases with few crackles.  No wheezing or rhonchi. ?Cardio: S1-S2 is normal regular.  No S3-S4.  No rubs murmurs or bruit ?GI: Abdomen is soft.  Nontender nondistended.  Bowel sounds are present normal.  No masses organomegaly ?Extremities: No edema.   ?Neurologic: No focal neurological deficits.  ? ? ? ?Lab Results: ? ?Data Reviewed: I have personally reviewed labs and imaging study reports ? ?CBC: ?Recent Labs  ?Lab 07/26/21 ?0803 07/27/21 ?0830 07/27/21 ?1242 07/29/21 ?0804 07/30/21 ?3704 07/31/21 ?0150  ?WBC 9.0 8.2  --  9.9 12.3* 13.3*  ?HGB 11.6* 10.4* 12.9* 11.2* 14.3 13.9  ?HCT 35.7* 32.7* 38.0* 35.2* 43.6 42.4  ?MCV 86.9 87.4  --  86.9 84.8 84.0  ?PLT 183 157  --  178 236 274  ? ? ? ?Basic Metabolic Panel: ?Recent Labs  ?Lab 07/26/21 ?0735 07/27/21 ?0830 07/27/21 ?1242 07/29/21 ?0804 07/30/21 ?8889 07/31/21 ?0150  ?NA 134* 133* 138 135 134* 134*  ?K 4.8 4.0 3.7 4.0 4.0 4.0  ?CL 95* 95*  --  99 93*  94*  ?CO2 24 27  --  _0 ?GLUCOSE 216* 204*  --  99 112* 112*  ?BUN 109* 81*  --  67* 47* 66*  ?CREATININE 8.06* 7.07*  --  6.09* 5.91* 7.24*  ?CALCIUM 7.2* 7.1*  --  7.1* 8.4* 8.5*  ?PHOS 9.1* 5.6*  --  4.3 4.9*  --   ? ? ? ?GFR: ?Estimated Creatinine Clearance: 9.7 mL/min (A) (by C-G formula based on SCr of 7.24 mg/dL (H)). ? ?Liver Function Tests: ?Recent Labs  ?Lab 07/26/21 ?0735 07/27/21 ?0830 07/29/21 ?0804 07/30/21 ?9892  ?ALBUMIN 3.1* 2.9* 2.8* 3.5  ? ? ? ?CBG: ?Recent Labs  ?Lab 07/31/21 ?0740 07/31/21 ?1227 07/31/21 ?1548 07/31/21 ?2158 08/01/21 ?0801  ?GLUCAP 105* 109* 123* 114* 153*  ? ? ? ? ? ?Recent Results (from the past 240 hour(s))  ?Body fluid culture w Gram Stain     Status: None  ? Collection Time: 07/23/21 11:16 AM  ? Specimen: Pleural Fluid  ?Result Value Ref Range Status  ? Specimen Description PLEURAL FLUID   Final  ? Special Requests RIGHT  Final  ? Gram Stain   Final  ?  MODERATE WBC PRESENT, PREDOMINANTLY MONONUCLEAR ?NO ORGANISMS SEEN ?  ? Culture   Final  ?  NO GROWTH 3 DAYS ?Performed at [facility]

## 2021-08-01 NOTE — Progress Notes (Signed)
Occupational Therapy Treatment ?Patient Details ?Name: Melvin Mcclure ?MRN: [id number] ?DOB: 1950/07/23 ?Today's Date: 08/01/2021 ? ? ?History of present illness Pt is a 71 y.o. male admitted 07/22/21 with worsening SOB; workup for acute on chronic respiratory failure with recurrent pleural effusion initially requiring bipap. S/p thoracentesis 3/21. S/p RLL bronchoscopy, bronchoalveolar lavage, transbronchial lung biopsy 3/22. Course complicated by AKI on CKD; iHD initiated 3/23. Pt also with acute methabolic encephalopathy ; question ICU delirium vs uremia. PMH includes CKD 5 (undergoing transplant evaluation), HFpEF, DM2. ?  ?OT comments ? Pt progressing steadily, cognition continues to limit pt's function in mobility and ADLs with pt requiring increased time and multimodal cues to follow commands and cues to maintain on-task attention. Decreased thoroughness with ADLs. Min assist for feeding and grooming, mod assist to doff front opening gown. Min to mod assist for mobility. Pt continues to be a good rehab candidate.   ? ?Recommendations for follow up therapy are one component of a multi-disciplinary discharge planning process, led by the attending physician.  Recommendations may be updated based on patient status, additional functional criteria and insurance authorization. ?   ?Follow Up Recommendations ? Acute inpatient rehab (3hours/day)  ?  ?Assistance Recommended at Discharge Frequent or constant Supervision/Assistance  ?Patient can return home with the following ? A lot of help with walking and/or transfers;A lot of help with bathing/dressing/bathroom;Assistance with feeding;Direct supervision/assist for medications management;Assist for transportation;Direct supervision/assist for financial management;Help with stairs or ramp for entrance;Assistance with cooking/housework ?  ?Equipment Recommendations ?  (defer to next venue)  ?  ?Recommendations for Other Services   ? ?  ?Precautions / Restrictions  Precautions ?Precautions: Fall;Other (comment) ?Precaution Comments: Watch SpO2 (per sister-in-law, wears PRN at home); urine incontinence ?Restrictions ?Weight Bearing Restrictions: No  ? ? ?  ? ?Mobility Bed Mobility ?Overal bed mobility: Needs Assistance ?Bed Mobility: Sit to Supine ?  ?  ?  ?Sit to supine: Min assist ?  ?General bed mobility comments: assist to guide trunk and for LEs into bed ?  ? ?Transfers ?Overall transfer level: Needs assistance ?Equipment used: Rolling walker (2 wheels) ?Transfers: Sit to/from Stand ?Sit to Stand: Min assist ?Stand pivot transfers: Mod assist ?  ?  ?  ?  ?General transfer comment: multimodal cues for technique, min assist to rise and steady, mod assist for transfer with RW ?  ?  ?Balance Overall balance assessment: Needs assistance ?  ?Sitting balance-Leahy Scale: Fair ?  ?  ?Standing balance support: Bilateral upper extremity supported, During functional activity ?Standing balance-Leahy Scale: Poor ?Standing balance comment: B UE support and up to mod assist ?  ?  ?  ?  ?  ?  ?  ?  ?  ?  ?  ?   ? ?ADL either performed or assessed with clinical judgement  ? ?ADL Overall ADL's : Needs assistance/impaired ?Eating/Feeding: Minimal assistance;Sitting ?Eating/Feeding Details (indicate cue type and reason): able to use fork or spoon with set up, improved coordination ?Grooming: Wash/dry hands;Wash/dry face;Sitting;Minimal assistance ?Grooming Details (indicate cue type and reason): cues for thoroughness ?  ?  ?  ?  ?Upper Body Dressing : Moderate assistance;Sitting ?  ?  ?  ?  ?  ?  ?  ?  ?  ?  ?General ADL Comments: frequently needing step by step cues to sequence during ADLs and mobility ?  ? ?Extremity/Trunk Assessment   ?  ?  ?  ?  ?  ? ?Vision   ?  ?  ?  Perception   ?  ?Praxis   ?  ? ?Cognition Arousal/Alertness: Awake/alert ?Behavior During Therapy: Flat affect ?Overall Cognitive Status: Impaired/Different from baseline ?Area of Impairment: Orientation, Attention, Memory,  Following commands, Safety/judgement, Awareness, Problem solving ?  ?  ?  ?  ?  ?  ?  ?  ?Orientation Level: Disoriented to, Time, Situation ?Current Attention Level: Sustained ?Memory: Decreased recall of precautions, Decreased short-term memory ?Following Commands: Follows one step commands inconsistently, Follows one step commands with increased time ?Safety/Judgement: Decreased awareness of safety, Decreased awareness of deficits ?Awareness: Intellectual ?Problem Solving: Slow processing, Decreased initiation, Difficulty sequencing, Requires verbal cues, Requires tactile cues ?General Comments: pt pleasant and eager to participate, needs multimodal cues and increased time to follow commands, off topic conversation ?  ?  ?   ?Exercises   ? ?  ?Shoulder Instructions   ? ? ?  ?General Comments pt c/o dizziness post-ambulation, BP 127/75, HR 91, SpO2 96% on RA. pt's friend (J.W.) present and supportive  ? ? ?Pertinent Vitals/ Pain       Pain Assessment ?Pain Assessment: Faces ?Faces Pain Scale: No hurt ? ?Home Living Family/patient expects to be discharged to:: Inpatient rehab ?Living Arrangements: Spouse/significant other ?  ?  ?  ?  ?  ?  ?  ?  ?  ?  ?  ?  ?  ?  ?  ?  ?  ? ?  ?Prior Functioning/Environment    ?  ?  ?  ?   ? ?Frequency ? Min 2X/week  ? ? ? ? ?  ?Progress Toward Goals ? ?OT Goals(current goals can now be found in the care plan section) ? Progress towards OT goals: Progressing toward goals ? ?Acute Rehab OT Goals ?OT Goal Formulation: With patient/family ?Time For Goal Achievement: 08/12/21 ?Potential to Achieve Goals: Good  ?Plan Discharge plan remains appropriate;Frequency needs to be updated   ? ?Co-evaluation ? ? ?   ?  ?  ?  ?  ? ?  ?AM-PAC OT "6 Clicks" Daily Activity     ?Outcome Measure ? ? Help from another person eating meals?: A Little ?Help from another person taking care of personal grooming?: A Little ?Help from another person toileting, which includes using toliet, bedpan, or urinal?:  A Lot ?Help from another person bathing (including washing, rinsing, drying)?: A Lot ?Help from another person to put on and taking off regular upper body clothing?: A Lot ?Help from another person to put on and taking off regular lower body clothing?: A Lot ?6 Click Score: 14 ? ?  ?End of Session Equipment Utilized During Treatment: Gait belt;Rolling walker (2 wheels) ? ?OT Visit Diagnosis: Unsteadiness on feet (R26.81);Other abnormalities of gait and mobility (R26.89);Muscle weakness (generalized) (M62.81);Other symptoms and signs involving cognitive function ?  ?Activity Tolerance Patient tolerated treatment well ?  ?Patient Left in bed;with call bell/phone within reach;with bed alarm set ?  ?Nurse Communication   ?  ? ?   ? ?Time: 1310-1325 ?OT Time Calculation (min): 15 min ? ?Charges: OT General Charges ?$OT Visit: 1 Visit ?OT Treatments ?$Self Care/Home Management : 8-22 mins ? ?Nestor Lewandowsky, OTR/L ?Acute Rehabilitation Services ?Pager: [phone] ?Office: [phone]  ? ?Malka So ?08/01/2021, 1:32 PM ?

## 2021-08-02 DIAGNOSIS — E1100 Type 2 diabetes mellitus with hyperosmolarity without nonketotic hyperglycemic-hyperosmolar coma (NKHHC): Secondary | ICD-10-CM | POA: Diagnosis not present

## 2021-08-02 DIAGNOSIS — G9341 Metabolic encephalopathy: Secondary | ICD-10-CM | POA: Diagnosis not present

## 2021-08-02 DIAGNOSIS — J9621 Acute and chronic respiratory failure with hypoxia: Secondary | ICD-10-CM | POA: Diagnosis not present

## 2021-08-02 DIAGNOSIS — I10 Essential (primary) hypertension: Secondary | ICD-10-CM | POA: Diagnosis not present

## 2021-08-02 LAB — RENAL FUNCTION PANEL
Albumin: 3.4 g/dL — ABNORMAL LOW (ref 3.5–5.0)
Anion gap: 18 — ABNORMAL HIGH (ref 5–15)
BUN: 80 mg/dL — ABNORMAL HIGH (ref 8–23)
CO2: 23 mmol/L (ref 22–32)
Calcium: 9.2 mg/dL (ref 8.9–10.3)
Chloride: 89 mmol/L — ABNORMAL LOW (ref 98–111)
Creatinine, Ser: 7.88 mg/dL — ABNORMAL HIGH (ref 0.61–1.24)
GFR, Estimated: 7 mL/min — ABNORMAL LOW (ref >60)
Glucose, Bld: 97 mg/dL (ref 70–99)
Phosphorus: 5.7 mg/dL — ABNORMAL HIGH (ref 2.5–4.6)
Potassium: 4.2 mmol/L (ref 3.5–5.1)
Sodium: 130 mmol/L — ABNORMAL LOW (ref 135–145)

## 2021-08-02 LAB — CBC
HCT: 47.1 % (ref 39.0–52.0)
Hemoglobin: 15.7 g/dL (ref 13.0–17.0)
MCH: 27.6 pg (ref 26.0–34.0)
MCHC: 33.3 g/dL (ref 30.0–36.0)
MCV: 82.9 fL (ref 80.0–100.0)
Platelets: 307 10*3/uL (ref 150–400)
RBC: 5.68 MIL/uL (ref 4.22–5.81)
RDW: 15.1 % (ref 11.5–15.5)
WBC: 13.1 10*3/uL — ABNORMAL HIGH (ref 4.0–10.5)
nRBC: 0 % (ref 0.0–0.2)

## 2021-08-02 LAB — GLUCOSE, CAPILLARY
Glucose-Capillary: 98 mg/dL (ref 70–99)
Glucose-Capillary: 89 mg/dL (ref 70–99)
Glucose-Capillary: 110 mg/dL — ABNORMAL HIGH (ref 70–99)
Glucose-Capillary: 91 mg/dL (ref 70–99)

## 2021-08-02 MED ORDER — HEPARIN SODIUM (PORCINE) 1000 UNIT/ML DIALYSIS: 20.0000 [IU]/kg | INTRAMUSCULAR | Status: DC | PRN

## 2021-08-02 NOTE — Procedures (Signed)
Patient was seen on dialysis and the procedure was supervised.  BFR 300  Via AVF BP is  107/70. ? ? Patient appears to be tolerating treatment well ? ?Louis Meckel ?08/02/2021 ? ?

## 2021-08-02 NOTE — Progress Notes (Addendum)
Admit: 07/22/2021 ?LOS: 10 ? ?Melvin Mcclure is a/an 71 y.o. male with a past medical history notable for renal failure (transplant candidate; left brachiocephalic AV fistula created 19/62/22 ), diastolic CHF, HTN, L7LG who presents with acute hypoxic respiratory failure.    ? ?Subjective: No acute events overnight. Seen in HD today. States he feels so-so.  Minimal UOP.  ? ?No intake/output data recorded. ? ?Filed Weights  ? 07/31/21 1228 08/01/21 0600 08/02/21 0836  ?Weight: 88.6 kg 86 kg 84.3 kg  ? ? ?Scheduled Meds: ? amoxicillin-clavulanate  1 tablet Oral Q2000  ? aspirin  81 mg Oral Daily  ? calcitRIOL  0.25 mcg Oral QODAY  ? calcium acetate  1,334 mg Oral TID WC  ? calcium carbonate  2 tablet Oral QHS  ? Chlorhexidine Gluconate Cloth  6 each Topical Daily  ? Chlorhexidine Gluconate Cloth  6 each Topical Q0600  ? docusate sodium  100 mg Oral BID  ? feeding supplement (NEPRO CARB STEADY)  237 mL Oral BID BM  ? heparin  5,000 Units Subcutaneous Q8H  ? hydrALAZINE  50 mg Oral TID  ? insulin aspart  0-5 Units Subcutaneous QHS  ? insulin aspart  0-6 Units Subcutaneous TID WC  ? insulin glargine-yfgn  20 Units Subcutaneous Daily  ? isosorbide mononitrate  60 mg Oral QPM  ? meclizine  25 mg Oral BID  ? mouth rinse  15 mL Mouth Rinse BID  ? polyethylene glycol  17 g Oral Daily  ? senna-docusate  1 tablet Oral QHS  ? sodium chloride flush  3 mL Intravenous Q12H  ? ?Continuous Infusions: ? ? ?PRN Meds:.acetaminophen **OR** acetaminophen, albuterol, calcium carbonate (dosed in mg elemental calcium), docusate sodium, haloperidol lactate, heparin, hydrALAZINE, HYDROcodone-acetaminophen, ondansetron **OR** ondansetron (ZOFRAN) IV, phenol, senna-docusate, sorbitol, zolpidem ? ? ?Physical Exam:  Blood pressure 128/65, pulse 84, temperature (!) 97.3 ?F (36.3 ?C), temperature source Temporal, resp. rate 19, height '5\' 10"'$  (1.778 m), weight 84.3 kg, SpO2 98 %. ?General: No acute distress. Age appropriate. ?Cardiac: RRR, normal  heart sounds, no murmurs ?Respiratory: CTAB, normal effort ?Abdomen: soft, mildly tender, nondistended ?Extremities: No LE edema ?Neuro: alert and oriented x3 ?Psych: normal affect ?Dialysis access: LUE AVF +b/t ? ?Recent Labs  ?Lab 07/30/21 ?0811 07/31/21 ?0150 08/01/21 ?9211 08/02/21 ?9417  ?NA 134* 134* 133* 130*  ?K 4.0 4.0 4.7 4.2  ?CL 93* 94* 90* 89*  ?CO2 '26 24 25 23  '$ ?GLUCOSE 112* 112* 151* 97  ?BUN 47* 66* 63* 80*  ?CREATININE 5.91* 7.24* 7.14* 7.88*  ?CALCIUM 8.4* 8.5* 8.9 9.2  ?PHOS 4.9*  --  5.8* 5.7*  ? ? ?Recent Labs  ?Lab 07/30/21 ?4081 07/31/21 ?0150 08/02/21 ?0742  ?WBC 12.3* 13.3* 13.1*  ?HGB 14.3 13.9 15.7  ?HCT 43.6 42.4 47.1  ?MCV 84.8 84.0 82.9  ?PLT 236 274 307  ? ? ?A/P ?Melvin Mcclure is a/an 71 y.o. male with a past medical history notable for renal failure ( left brachiocephalic AV fistula created 44/81/85 ), diastolic CHF, HTN, U3JS.   ?  ?Acute hypoxic respiratory failure ?Hypervolemia 2/2 AoC CKD5, worsening  Now ESRD ?H/o CHF ?-HD MWF-  will change his dialysis schedule eventually because at discharge will be [facility] TTS ?-Continue to monitor daily Cr, Dose meds for GFR<15 ?-Maintain MAP>65 for optimal renal perfusion.   ?-Respiratory management per primary ?-Avoid nephrotoxic medications including NSAIDs and iodinated intravenous contrast exposure unless the latter is absolutely indicated.  Preferred narcotic agents for pain control are hydromorphone, fentanyl,  and methadone. Morphine should not be used. Avoid Baclofen and avoid oral sodium phosphate and magnesium citrate based laxatives / bowel preps. Continue strict Input and Output monitoring. Will monitor the patient closely with you and intervene or adjust therapy as indicated by changes in clinical status/labs  ? ?Hypertension, stable ?MAP >65 ?-Hydral TID, imdur ? ?Rt pleural effusion ?-s/p thora 3/22>transudative ? ?Anemia of CKD, stable ?-transfuse PRN for hgb <7 ?-hgb currently at goal, not on ESA yet ? ?CKD-MBD ?-phoslo to 2  tabs tidac ?-Renal diet. ?Need to check PTH ?  ?T2DM ?-manage per primary team ? ?Confusion ?Waxing and waning. Today appears appropriate.  ? ?Melvin Fee, DO ?08/02/2021, 8:51 AM ?PGY-3, [place] ? ?Patient seen and examined, agree with above note with above modifications. Seen on HD  -  doing well- mental status waxes and wanes-  possibly looking into inpatient rehab as a dispo-  have him on MWF but OP will be TTS -  cont adjust HD related meds-  needs PTH checked  ?Melvin Parish, MD ?08/02/2021 ? ? ? ? ? ? ? ? ? ? ? ? ? ?

## 2021-08-02 NOTE — Progress Notes (Signed)
? ?[facility] ?PROGRESS NOTE ? ? ?Melvin Mcclure IEP:[id number] DOB: 01/09/1951 DOA: 07/22/2021  10 ?DOS: the patient was seen and examined on 08/02/2021 ? ?PCP: Nolene Ebbs, MD ? ?Brief History and Hospital Course:  ?71 y.o. 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  Apparently uses 2 L of oxygen by nasal cannula at home as needed.  Patient with history of recurrent pleural effusion.  Seen by pulmonology.  Had high oxygen requirements.  Was treated transferred to the ICU.  Underwent diagnostic and therapeutic thoracocentesis with removal of 1500 MLS of fluid on 07/23/2021.  Patient then underwent flexible bronchoscopy with brushing and bronchoalveolar lavage and transbronchial lung biopsy on 07/24/2021 for nonresolving opacity with recurrent pleural effusion. Required BiPAP.  Then transitioned to high flow nasal cannula.  BiPAP recommended at nighttime by pulmonology which the patient has been refusing.  Subsequently transferred back to floor. ? ? ?Consultants: Pulmonology.  Nephrology ? ?Procedures: Thoracentesis.  Bronchoscopy ? ? ?Subjective: ?Sleepy this morning but easily arousable.  Wife is at the bedside.  No new issues noted.  Appetite is improving.  Mentation is improving per wife ? ? ?Assessment/Plan: ? ? ?* Acute on chronic respiratory failure with hypoxia ([place]) ?Recurrent pleural effusion ?Chronic bibasilar infiltrate ? ?Likely secondary to recurrent pleural effusion.  Underwent thoracentesis.  Seen by pulmonology.   ?Pleural effusion likely due to combination of fluid overload as well as infection. ?Status post thoracocentesis on  07/23/2021 with removal of 1500 mL of pleural fluid.  Pleural fluid culture negative in 24 hours.  Additionally, patient underwent flexible  bronchoscopy with brushing and bronchoalveolar lavage and transbronchial lung biopsy on 07/24/2021.  ?BAL positive for Prevotella; Continue Augmentin, end date 08/05/21. Follow fungal cultures.   ?Remains afebrile.  WBC minimally elevated.   ?Saturations are in the mid to late 90s.  Continue to wean down oxygen.   ?Patient refuses BiPAP at night but seems to be doing well without it. ? ?Acute metabolic encephalopathy ?Likely ICU delirium component to confusion/lethargy vs uremia ?Now on hemodialysis.   ?Mentation is gradually improving.  No focal neurological deficits.   ? ?Stage 5 chronic kidney disease ([place]) ?Progression to end-stage renal disease ? ?Progression of kidney disease during this admission.  Nephrology was consulted.  Initially given high-dose furosemide without improvement.  Subsequently started on hemodialysis. ?Currently being dialyzed on a Monday Wednesday Friday schedule. ? ?Chronic diastolic CHF (congestive heart failure) ([place]) ?Volume being managed with hemodialysis now.   ?2D echocardiogram done 07/23/2021 shows preserved LV function with LV ejection fraction of 55 to 60% with no regional wall motion abnormality and grade 2 diastolic dysfunction. ? ?DM2 (diabetes mellitus, type 2) ([place]) ?Noted to be on SSI and glargine.   HbA1c 7.0.   ?CBGs are reasonably well controlled. ? ?HTN (hypertension) ?Blood pressure reasonably well controlled.   ?Noted to be on hydralazine isosorbide mononitrate.   ?Looks like he has been taken off of amlodipine and carvedilol. ? ?Strongyloidiasis ?-Should have been effectively treated with Ivermectin x 2 rounds ?-F/u with ID and/or transplant as needed ? ? ? ?DVT Prophylaxis: Subcutaneous heparin ?Code Status: Full code ?Family Communication: Discussed with patient and his wife ?Disposition Plan: To be determined.  Inpatient rehabilitation recommended by therapy ? ?Status is: Inpatient ?Remains inpatient appropriate because: Progression to end-stage renal disease, acute  respiratory failure ? ? ? ? ?  Medications: Scheduled: ? amoxicillin-clavulanate  1 tablet Oral Q2000  ? aspirin  81 mg Oral Daily  ? calcitRIOL  0.25 mcg Oral QODAY  ? calcium acetate  1,334 mg Oral TID WC  ? calcium carbonate  2 tablet Oral QHS  ? Chlorhexidine Gluconate Cloth  6 each Topical Daily  ? Chlorhexidine Gluconate Cloth  6 each Topical Q0600  ? docusate sodium  100 mg Oral BID  ? feeding supplement (NEPRO CARB STEADY)  237 mL Oral BID BM  ? heparin  5,000 Units Subcutaneous Q8H  ? hydrALAZINE  50 mg Oral TID  ? insulin aspart  0-5 Units Subcutaneous QHS  ? insulin aspart  0-6 Units Subcutaneous TID WC  ? insulin glargine-yfgn  20 Units Subcutaneous Daily  ? isosorbide mononitrate  60 mg Oral QPM  ? meclizine  25 mg Oral BID  ? mouth rinse  15 mL Mouth Rinse BID  ? polyethylene glycol  17 g Oral Daily  ? senna-docusate  1 tablet Oral QHS  ? sodium chloride flush  3 mL Intravenous Q12H  ? ?Continuous: ? ? ?YTK:ZSWFUXNATFTDD **OR** acetaminophen, albuterol, calcium carbonate (dosed in mg elemental calcium), docusate sodium, haloperidol lactate, heparin, hydrALAZINE, HYDROcodone-acetaminophen, ondansetron **OR** ondansetron (ZOFRAN) IV, phenol, senna-docusate, sorbitol, zolpidem ? ?Antibiotics: ?Anti-infectives (From admission, onward)  ? ? Start     Dose/Rate Route Frequency Ordered Stop  ? 07/29/21 1200  amoxicillin-clavulanate (AUGMENTIN) 500-125 MG per tablet 500 mg       ? 1 tablet Oral Daily 07/29/21 0914 08/05/21 1959  ? ?  ? ? ?Objective: ? ?Vital Signs ? ?Vitals:  ? 08/02/21 0814 08/02/21 0836 08/02/21 0902 08/02/21 0930  ?BP: 140/77 128/65 110/77 107/70  ?Pulse: 100 84 84 91  ?Resp: _0 ?Temp: 98.5 ?F (36.9 ?C) (!) 97.3 ?F (36.3 ?C)    ?TempSrc: Oral Temporal    ?SpO2: 100% 98%  97%  ?Weight:  84.3 kg    ?Height:      ? ?No intake or output data in the 24 hours ending 08/02/21 1003 ? ?Filed Weights  ? 07/31/21 1228 08/01/21 0600 08/02/21 0836  ?Weight: 88.6 kg 86 kg 84.3 kg  ? ? ?General  appearance: Sleepy but easily arousable. ?Resp: Clear to auscultation bilaterally.  Normal effort ?Cardio: S1-S2 is normal regular.  No S3-S4.  No rubs murmurs or bruit ?GI: Abdomen is soft.  Nontender nondistended.  Bowel sounds are present normal.  No masses organomegaly ?Extremities: No edema.  Full range of motion of lower extremities. ?Neurologic: No focal neurological deficits.  Oriented to person ? ? ? ? ?Lab Results: ? ?Data Reviewed: I have personally reviewed labs and imaging study reports ? ?CBC: ?Recent Labs  ?Lab 07/27/21 ?0830 07/27/21 ?1242 07/29/21 ?0804 07/30/21 ?2202 07/31/21 ?0150 08/02/21 ?5427  ?WBC 8.2  --  9.9 12.3* 13.3* 13.1*  ?HGB 10.4* 12.9* 11.2* 14.3 13.9 15.7  ?HCT 32.7* 38.0* 35.2* 43.6 42.4 47.1  ?MCV 87.4  --  86.9 84.8 84.0 82.9  ?PLT 157  --  178 236 274 307  ? ? ? ?Basic Metabolic Panel: ?Recent Labs  ?Lab 07/27/21 ?0830 07/27/21 ?1242 07/29/21 ?0804 07/30/21 ?0623 07/31/21 ?0150 08/01/21 ?7628 08/02/21 ?3151  ?NA 133*   < > 135 134* 134* 133* 130*  ?K 4.0   < > 4.0 4.0 4.0 4.7 4.2  ?CL 95*  --  99 93* 94* 90* 89*  ?CO2 27  --  _1 ?GLUCOSE 204*  --  99 112* 112* 151* 97  ?BUN 81*  --  67* 47* 66* 63* 80*  ?CREATININE 7.07*  --  6.09* 5.91* 7.24* 7.14* 7.88*  ?CALCIUM 7.1*  --  7.1* 8.4* 8.5* 8.9 9.2  ?PHOS 5.6*  --  4.3 4.9*  --  5.8* 5.7*  ? < > = values in this interval not displayed.  ? ? ? ?GFR: ?Estimated Creatinine Clearance: 8.9 mL/min (A) (by C-G formula based on SCr of 7.88 mg/dL (H)). ? ?Liver Function Tests: ?Recent Labs  ?Lab 07/27/21 ?0830 07/29/21 ?0804 07/30/21 ?2542 08/01/21 ?7062 08/02/21 ?3762  ?ALBUMIN 2.9* 2.8* 3.5 3.5 3.4*  ? ? ? ?CBG: ?Recent Labs  ?Lab 08/01/21 ?0801 08/01/21 ?1144 08/01/21 ?1602 08/01/21 ?2223 08/02/21 ?8315  ?GLUCAP 153* 151* 147* 138* 98  ? ? ? ? ? ?Recent Results (from the past 240 hour(s))  ?Body fluid culture w Gram Stain     Status: None  ? Collection Time: 07/23/21 11:16 AM  ? Specimen: Pleural Fluid  ?Result Value Ref  Range Status  ? Specimen Description PLEURAL FLUID  Final  ? Special Requests RIGHT  Final  ? Gram Stain   Final  ?  MODERATE WBC PRESENT, PREDOMINANTLY MONONUCLEAR ?NO ORGANISMS SEEN ?  ? Culture   Final  ?

## 2021-08-02 NOTE — Progress Notes (Signed)
PT Cancellation Note ? ?Patient Details ?Name: Ibn Stief ?MRN: [id number] ?DOB: 1950-08-18 ? ? ?Cancelled Treatment:    Reason Eval/Treat Not Completed: Patient at procedure or test/unavailable (HD). Will follow-up for PT treatment as schedule permits. ? ?Mabeline Caras, PT, DPT ?Acute Rehabilitation Services  ?Pager [phone] ?Office [phone] ? ?Derry Lory ?08/02/2021, 10:46 AM ? ? ?

## 2021-08-02 NOTE — Progress Notes (Addendum)
Mobility Specialist Progress Note  ? ? 08/02/21 1417  ?Mobility  ?Activity Transferred to/from [facility]  ?Level of Assistance Moderate assist, patient does 50-74%  ?Assistive Device Front wheel walker  ?Distance Ambulated (ft) 2 ft  ?Activity Response Tolerated fair  ?$Mobility charge 1 Mobility  ? ?Pt received with bed alarm going off trying to get up. Pt seemed like he was straining at times but able to void and have BM. Void looked dark in color. Pt asking to not ambulate as he c/o feeling very tired. Returned to bed with alarm on and PA and family present.  ? ?Hildred Alamin ?Mobility Specialist  ?  ?

## 2021-08-03 DIAGNOSIS — G9341 Metabolic encephalopathy: Secondary | ICD-10-CM | POA: Diagnosis not present

## 2021-08-03 DIAGNOSIS — E1100 Type 2 diabetes mellitus with hyperosmolarity without nonketotic hyperglycemic-hyperosmolar coma (NKHHC): Secondary | ICD-10-CM | POA: Diagnosis not present

## 2021-08-03 DIAGNOSIS — I10 Essential (primary) hypertension: Secondary | ICD-10-CM | POA: Diagnosis not present

## 2021-08-03 DIAGNOSIS — J9621 Acute and chronic respiratory failure with hypoxia: Secondary | ICD-10-CM | POA: Diagnosis not present

## 2021-08-03 LAB — GLUCOSE, CAPILLARY
Glucose-Capillary: 82 mg/dL (ref 70–99)
Glucose-Capillary: 104 mg/dL — ABNORMAL HIGH (ref 70–99)
Glucose-Capillary: 98 mg/dL (ref 70–99)
Glucose-Capillary: 229 mg/dL — ABNORMAL HIGH (ref 70–99)

## 2021-08-03 MED ORDER — INSULIN GLARGINE-YFGN 100 UNIT/ML ~~LOC~~ SOLN
10.0000 [IU] | Freq: Every day | SUBCUTANEOUS | Status: DC
  Administered 2021-08-04 – 2021-08-05 (×2): 10 [IU] via SUBCUTANEOUS
  Filled 2021-08-03 (×3): qty 0.1

## 2021-08-03 MED ORDER — HYDRALAZINE HCL 25 MG PO TABS
25.0000 mg | ORAL_TABLET | Freq: Three times a day (TID) | ORAL | Status: DC
Start: 2021-08-03 — End: 2021-08-12
  Administered 2021-08-03 – 2021-08-12 (×23): 25 mg via ORAL
  Filled 2021-08-03 (×25): qty 1

## 2021-08-03 NOTE — Plan of Care (Signed)

## 2021-08-03 NOTE — Progress Notes (Signed)
Mobility Specialist Progress Note: ? ? 08/03/21 1106  ?Mobility  ?Activity Transferred from bed to chair;Ambulated with assistance in room  ?Level of Assistance Contact guard assist, steadying assist  ?Assistive Device Front wheel walker  ?Distance Ambulated (ft) 4 ft  ?Activity Response Tolerated well  ?$Mobility charge 1 Mobility  ? ?Pt received in bed willing to participate in mobility. No complaints of pain. Pt able to stand without assist from elevated bed. Left in chair with call bell in reach, all needs met and chair alarm activated.  ? ?Yasin Ducat ?Mobility Specialist ?Primary Phone [phone] ? ?

## 2021-08-03 NOTE — Progress Notes (Signed)
? ?[facility] ?PROGRESS NOTE ? ? ?Melvin Mcclure NAT:[id number] DOB: [birth year] DOA: 07/22/2021  11 ?DOS: the patient was seen and examined on 08/03/2021 ? ?PCP: Nolene Ebbs, MD ? ?Brief History and Hospital Course:  ?71 y.o. 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  Apparently uses 2 L of oxygen by nasal cannula at home as needed.  Patient with history of recurrent pleural effusion.  Seen by pulmonology.  Had high oxygen requirements.  Was treated transferred to the ICU.  Underwent diagnostic and therapeutic thoracocentesis with removal of 1500 MLS of fluid on 07/23/2021.  Patient then underwent flexible bronchoscopy with brushing and bronchoalveolar lavage and transbronchial lung biopsy on 07/24/2021 for nonresolving opacity with recurrent pleural effusion. Required BiPAP.  Then transitioned to high flow nasal cannula.  BiPAP recommended at nighttime by pulmonology which the patient has been refusing.  Subsequently transferred back to floor.  Mentation has been gradually improving.  Remains deconditioned. ? ? ?Consultants: Pulmonology.  Nephrology ? ?Procedures: Thoracentesis.  Bronchoscopy ? ? ?Subjective: ?Patient awake alert.  Denies any complaints.  Mentions that he is feeling better.  No family at bedside today.   ? ? ?Assessment/Plan: ? ? ?* Acute on chronic respiratory failure with hypoxia ([place]) ?Recurrent pleural effusion ?Chronic bibasilar infiltrate ? ?Likely secondary to recurrent pleural effusion.  Underwent thoracentesis.  Seen by pulmonology.   ?Pleural effusion likely due to combination of fluid overload as well as infection. ?Status post thoracocentesis on  07/23/2021 with removal of 1500 mL of pleural fluid.  Pleural fluid culture negative in 24 hours.  Additionally,  patient underwent flexible bronchoscopy with brushing and bronchoalveolar lavage and transbronchial lung biopsy on 07/24/2021.  ?BAL positive for Prevotella; Continue Augmentin, end date 08/05/21. Follow fungal cultures.   ?Remains afebrile.  WBC minimally elevated.   ?Saturations are in the mid 90s on 2.5 L of oxygen by nasal cannula.  Respiratory status is stable for the most part. ?We will discontinue BiPAP since he has been refusing it anyway. ? ?Acute metabolic encephalopathy ?Likely ICU delirium component to confusion/lethargy vs uremia ?Now on hemodialysis.   ?Mentation is gradually improving.  No focal neurological deficits.   ? ?Stage 5 chronic kidney disease ([place]) ?Progression to End-stage renal disease ? ?Progression of kidney disease during this admission.  Nephrology was consulted.  Initially given high-dose furosemide without improvement.  Subsequently started on hemodialysis. ?Currently being dialyzed on a Monday Wednesday Friday schedule. ? ?Chronic diastolic CHF (congestive heart failure) ([place]) ?Volume being managed with hemodialysis.   ?2D echocardiogram done 07/23/2021 shows preserved LV function with LV ejection fraction of 55 to 60% with no regional wall motion abnormality and grade 2 diastolic dysfunction. ? ?DM2 (diabetes mellitus, type 2) ([place]) ?Noted to be on SSI and glargine.   HbA1c 7.0.   ?CBGs noted to be borderline low in the last 24 hours.  We will skip the dose of his glargine today and decrease the dose from tomorrow.  Monitor CBGs.  Encourage oral intake. ? ?HTN (hypertension) ?Blood pressure reasonably well controlled.   ?Noted to be on hydralazine isosorbide mononitrate.   ?Looks like he has been taken off of amlodipine and carvedilol. ? ?Strongyloidiasis ?-Should have been effectively treated with Ivermectin x 2 rounds ?-F/u with ID and/or transplant as needed ? ? ? ?DVT Prophylaxis: Subcutaneous heparin ?Code Status: Full code ?Family Communication:  Discussed with patient and his  wife ?Disposition Plan: Inpatient rehabilitation was recommended by physical therapy.  Declined by [facility].  We will consult TOC to initiate SNF placement.   ? ? ?Status is: Inpatient ?Remains inpatient appropriate because: Progression to end-stage renal disease, acute respiratory failure ? ? ? ? ?Medications: Scheduled: ? amoxicillin-clavulanate  1 tablet Oral Q2000  ? aspirin  81 mg Oral Daily  ? calcitRIOL  0.25 mcg Oral QODAY  ? calcium acetate  1,334 mg Oral TID WC  ? calcium carbonate  2 tablet Oral QHS  ? Chlorhexidine Gluconate Cloth  6 each Topical Daily  ? Chlorhexidine Gluconate Cloth  6 each Topical Q0600  ? docusate sodium  100 mg Oral BID  ? feeding supplement (NEPRO CARB STEADY)  237 mL Oral BID BM  ? heparin  5,000 Units Subcutaneous Q8H  ? hydrALAZINE  50 mg Oral TID  ? insulin aspart  0-6 Units Subcutaneous TID WC  ? [START ON 08/04/2021] insulin glargine-yfgn  10 Units Subcutaneous Daily  ? isosorbide mononitrate  60 mg Oral QPM  ? meclizine  25 mg Oral BID  ? mouth rinse  15 mL Mouth Rinse BID  ? polyethylene glycol  17 g Oral Daily  ? senna-docusate  1 tablet Oral QHS  ? sodium chloride flush  3 mL Intravenous Q12H  ? ?Continuous: ? ? ?YQM:[id number] **OR** acetaminophen, albuterol, calcium carbonate (dosed in mg elemental calcium), docusate sodium, haloperidol lactate, hydrALAZINE, HYDROcodone-acetaminophen, ondansetron **OR** ondansetron (ZOFRAN) IV, phenol, senna-docusate, sorbitol, zolpidem ? ?Antibiotics: ?Anti-infectives (From admission, onward)  ? ? Start     Dose/Rate Route Frequency Ordered Stop  ? 07/29/21 1200  amoxicillin-clavulanate (AUGMENTIN) 500-125 MG per tablet 500 mg       ? 1 tablet Oral Daily 07/29/21 0914 08/05/21 1959  ? ?  ? ? ?Objective: ? ?Vital Signs ? ?Vitals:  ? 08/03/21 0100 08/03/21 0401 08/03/21 0500 08/03/21 0804  ?BP:    115/73  ?Pulse: 88   91  ?Resp:    20  ?Temp:  98.5 ?F (36.9 ?C)    ?TempSrc:  Oral    ?SpO2:    94%  ?Weight:   85.6 kg   ?Height:       ? ? ?Intake/Output Summary (Last 24 hours) at 08/03/2021 1033 ?Last data filed at 08/02/2021 1230 ?Gross per 24 hour  ?Intake --  ?Output 930 ml  ?Net -930 ml  ? ? ?Filed Weights  ? 08/02/21 0836 08/02/21 1230 08/03/21 0500  ?Weight: 84.3 kg 83 kg 85.6 kg  ? ? ?General appearance: Awake alert.  In no distress ?Resp: Normal effort at rest.  Diminished air entry at the bases.  No wheezing or rhonchi. ?Cardio: S1-S2 is normal regular.  No S3-S4.  No rubs murmurs or bruit ?GI: Abdomen is soft.  Nontender nondistended.  Bowel sounds are present normal.  No masses organomegaly ?Extremities: No edema.  Full range of motion of lower extremities. ?Neurologic: Alert and oriented x3.  No focal neurological deficits.  ? ? ? ? ? ?Lab Results: ? ?Data Reviewed: I have personally reviewed labs and imaging study reports ? ?CBC: ?Recent Labs  ?Lab 07/27/21 ?1242 07/29/21 ?0804 07/30/21 ?1324 07/31/21 ?0150 08/02/21 ?4010  ?WBC  --  9.9 12.3* 13.3* 13.1*  ?HGB 12.9* 11.2* 14.3 13.9 15.7  ?HCT 38.0* 35.2* 43.6 42.4 47.1  ?MCV  --  86.9 84.8 84.0 82.9  ?PLT  --  178 236 274 307  ? ? ? ?Basic Metabolic  Panel: ?Recent Labs  ?Lab 07/29/21 ?0804 07/30/21 ?3606 07/31/21 ?0150 08/01/21 ?7703 08/02/21 ?4035  ?NA 135 134* 134* 133* 130*  ?K 4.0 4.0 4.0 4.7 4.2  ?CL 99 93* 94* 90* 89*  ?CO2 _0 ?GLUCOSE 99 112* 112* 151* 97  ?BUN 67* 47* 66* 63* 80*  ?CREATININE 6.09* 5.91* 7.24* 7.14* 7.88*  ?CALCIUM 7.1* 8.4* 8.5* 8.9 9.2  ?PHOS 4.3 4.9*  --  5.8* 5.7*  ? ? ? ?GFR: ?Estimated Creatinine Clearance: 8.9 mL/min (A) (by C-G formula based on SCr of 7.88 mg/dL (H)). ? ?Liver Function Tests: ?Recent Labs  ?Lab 07/29/21 ?0804 07/30/21 ?2481 08/01/21 ?8590 08/02/21 ?9311  ?ALBUMIN 2.8* 3.5 3.5 3.4*  ? ? ? ?CBG: ?Recent Labs  ?Lab 08/02/21 ?0808 08/02/21 ?1255 08/02/21 ?1619 08/02/21 ?2030 08/03/21 ?0753  ?GLUCAP 98 89 110* 91 82  ? ? ? ? ? ?Recent Results (from the past 240 hour(s))  ?MRSA Next Gen by PCR, Nasal     Status: None  ?  Collection Time: 07/24/21  7:27 PM  ? Specimen: Nasal Mucosa; Nasal Swab  ?Result Value Ref Range Status  ? MRSA by PCR Next Gen NOT DETECTED NOT DETECTED Final  ?  Comment: (NOTE) ?The GeneXpert MRSA Assay

## 2021-08-03 NOTE — Progress Notes (Signed)
Subjective:  HD yest-  removed 930 due to hypotension -  PB still a little soft  ? ? ?Objective ?Vital signs in last 24 hours: ?Vitals:  ? 08/03/21 0100 08/03/21 0401 08/03/21 0500 08/03/21 0804  ?BP:    115/73  ?Pulse: 88   91  ?Resp:    20  ?Temp:  98.5 ?F (36.9 ?C)    ?TempSrc:  Oral    ?SpO2:    94%  ?Weight:   85.6 kg   ?Height:      ? ?Weight change:  ? ?Intake/Output Summary (Last 24 hours) at 08/03/2021 1059 ?Last data filed at 08/02/2021 1230 ?Gross per 24 hour  ?Intake --  ?Output 930 ml  ?Net -930 ml  ? ? ?Assessment/ Plan: ?Pt is a 71 y.o. yo male with advanced CKD at baseline who was admitted on 07/22/2021 with  respiratory failure--  dialysis has been initiated this hospitalization for ESRD ?Assessment/Plan: ?1. Renal-  dialysis has been initiated this hospitalization for ESRD-  have been able to use his AVF- first treatment 3/23-   we now have him on a MWF schedule but OP schedule is going to be TTS at [facility].  Possibly next week do Mon/Thurs/Sat ?2. HTN/volume-  was very volume overloaded to start-  have achieved much UF-  I think at EDW-  also had a thoracentesis this hosp.  Is still on hydralazine 50 TID-  will take it down to 25 TID and may be able to stop -  also on imdur ?3. MS-  has only been very slow to improve but is improving ?4. Anemia-  hgb has been great-  has not required any treatment  ?5. Secondary hyperparathyroidism-  on phoslo-  last phos 5.7-  also on calcitriol daily oral -  started as OP-  have not checked a PTH-  will order for Monday-  will need to be converted to dialysis dosing -  I think [facility] calcitriol [facility] ?  ?6. Dispo- deconditioned-  tried for rehab-  did not get approved, now looking at SNF ? ? ?Louis Meckel  ? ? ?Labs: ?Basic Metabolic Panel: ?Recent Labs  ?Lab 07/30/21 ?0811 07/31/21 ?0150 08/01/21 ?1093 08/02/21 ?2355  ?NA 134* 134* 133* 130*  ?K 4.0 4.0 4.7 4.2  ?CL 93* 94* 90* 89*  ?CO2 '26 24 25 23  '$ ?GLUCOSE 112* 112* 151* 97  ?BUN 47* 66* 63* 80*   ?CREATININE 5.91* 7.24* 7.14* 7.88*  ?CALCIUM 8.4* 8.5* 8.9 9.2  ?PHOS 4.9*  --  5.8* 5.7*  ? ?Liver Function Tests: ?Recent Labs  ?Lab 07/30/21 ?0811 08/01/21 ?7322 08/02/21 ?0254  ?ALBUMIN 3.5 3.5 3.4*  ? ?No results for input(s): LIPASE, AMYLASE in the last 168 hours. ?No results for input(s): AMMONIA in the last 168 hours. ?CBC: ?Recent Labs  ?Lab 07/29/21 ?0804 07/30/21 ?2706 07/31/21 ?0150 08/02/21 ?2376  ?WBC 9.9 12.3* 13.3* 13.1*  ?HGB 11.2* 14.3 13.9 15.7  ?HCT 35.2* 43.6 42.4 47.1  ?MCV 86.9 84.8 84.0 82.9  ?PLT 178 236 274 307  ? ?Cardiac Enzymes: ?No results for input(s): CKTOTAL, CKMB, CKMBINDEX, TROPONINI in the last 168 hours. ?CBG: ?Recent Labs  ?Lab 08/02/21 ?0808 08/02/21 ?1255 08/02/21 ?1619 08/02/21 ?2030 08/03/21 ?0753  ?GLUCAP 98 89 110* 91 82  ? ? ?Iron Studies: No results for input(s): IRON, TIBC, TRANSFERRIN, FERRITIN in the last 72 hours. ?Studies/Results: ?No results found. ?Medications: ?Infusions: ? ? ?Scheduled Medications: ? amoxicillin-clavulanate  1 tablet Oral Q2000  ? aspirin  81 mg Oral Daily  ? calcitRIOL  0.25 mcg Oral QODAY  ? calcium acetate  1,334 mg Oral TID WC  ? calcium carbonate  2 tablet Oral QHS  ? Chlorhexidine Gluconate Cloth  6 each Topical Daily  ? Chlorhexidine Gluconate Cloth  6 each Topical Q0600  ? docusate sodium  100 mg Oral BID  ? feeding supplement (NEPRO CARB STEADY)  237 mL Oral BID BM  ? heparin  5,000 Units Subcutaneous Q8H  ? hydrALAZINE  50 mg Oral TID  ? insulin aspart  0-6 Units Subcutaneous TID WC  ? [START ON 08/04/2021] insulin glargine-yfgn  10 Units Subcutaneous Daily  ? isosorbide mononitrate  60 mg Oral QPM  ? meclizine  25 mg Oral BID  ? mouth rinse  15 mL Mouth Rinse BID  ? polyethylene glycol  17 g Oral Daily  ? senna-docusate  1 tablet Oral QHS  ? sodium chloride flush  3 mL Intravenous Q12H  ? ? have reviewed scheduled and prn medications. ? ?Physical Exam: ?General:  NAD-  in bedside chair ?Heart: RRR ?Lungs: mostly clear ?Abdomen: soft,  non tender ?Extremities: no edema ?Dialysis Access: left upper AVF-  good thrill and bruit   ? ? ?08/03/2021,10:59 AM ? LOS: 11 days  ? ?  ? ? ? ? ?

## 2021-08-04 DIAGNOSIS — I10 Essential (primary) hypertension: Secondary | ICD-10-CM | POA: Diagnosis not present

## 2021-08-04 DIAGNOSIS — G9341 Metabolic encephalopathy: Secondary | ICD-10-CM | POA: Diagnosis not present

## 2021-08-04 DIAGNOSIS — E1100 Type 2 diabetes mellitus with hyperosmolarity without nonketotic hyperglycemic-hyperosmolar coma (NKHHC): Secondary | ICD-10-CM | POA: Diagnosis not present

## 2021-08-04 LAB — GLUCOSE, CAPILLARY
Glucose-Capillary: 169 mg/dL — ABNORMAL HIGH (ref 70–99)
Glucose-Capillary: 188 mg/dL — ABNORMAL HIGH (ref 70–99)
Glucose-Capillary: 152 mg/dL — ABNORMAL HIGH (ref 70–99)
Glucose-Capillary: 173 mg/dL — ABNORMAL HIGH (ref 70–99)

## 2021-08-04 MED ORDER — CHLORHEXIDINE GLUCONATE CLOTH 2 % EX PADS
6.0000 | MEDICATED_PAD | Freq: Every day | CUTANEOUS | Status: DC
  Administered 2021-08-04 – 2021-08-07 (×3): 6 via TOPICAL

## 2021-08-04 NOTE — Plan of Care (Signed)

## 2021-08-04 NOTE — Progress Notes (Signed)
? ?[facility] ?PROGRESS NOTE ? ? ?Melvin Mcclure GHW:[id number] DOB: 30-Mar-1951 DOA: 07/22/2021  12 ?DOS: the patient was seen and examined on 08/04/2021 ? ?PCP: Nolene Ebbs, MD ? ?Brief History and Hospital Course:  ?71 y.o. 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  Apparently uses 2 L of oxygen by nasal cannula at home as needed.  Patient with history of recurrent pleural effusion.  Seen by pulmonology.  Had high oxygen requirements.  Was treated transferred to the ICU.  Underwent diagnostic and therapeutic thoracocentesis with removal of 1500 MLS of fluid on 07/23/2021.  Patient then underwent flexible bronchoscopy with brushing and bronchoalveolar lavage and transbronchial lung biopsy on 07/24/2021 for nonresolving opacity with recurrent pleural effusion. Required BiPAP.  Then transitioned to high flow nasal cannula.  BiPAP recommended at nighttime by pulmonology which the patient has been refusing.  Subsequently transferred back to floor.  Mentation has been gradually improving.  Remains deconditioned. ? ? ?Consultants: Pulmonology.  Nephrology ? ?Procedures: Thoracentesis.  Bronchoscopy ? ? ?Subjective: ?Patient is awake alert.  Slightly distracted.  No complaints offered.   ? ? ?Assessment/Plan: ? ? ?* Acute on chronic respiratory failure with hypoxia ([place]) ?,Recurrent pleural effusion ?Chronic bibasilar infiltrate ? ?Likely secondary to recurrent pleural effusion.  Underwent thoracentesis.  Seen by pulmonology.   ?Pleural effusion likely due to combination of fluid overload as well as infection. ?Status post thoracocentesis on  07/23/2021 with removal of 1500 mL of pleural fluid.  Pleural fluid culture negative in 24 hours.  Additionally, patient underwent flexible bronchoscopy  with brushing and bronchoalveolar lavage and transbronchial lung biopsy on 07/24/2021.  ?BAL positive for Prevotella; Continue Augmentin, end date 08/05/21. Follow fungal cultures.   ?Remains afebrile.  WBC minimally elevated.   ?Saturations are in the mid 90s on 2.5 L of oxygen by nasal cannula.  Respiratory status is stable for the most part. ?BiPAP was discontinued since respiratory status has significantly improved, plus he was refusing it anyway. ? ?Acute metabolic encephalopathy ?Likely ICU delirium component to confusion/lethargy vs uremia ?Now on hemodialysis.   ?Mentation is gradually improving.  No focal neurological deficits.   ? ?Stage 5 chronic kidney disease ([name]) ?Progression to End-stage renal disease ? ?Progression of kidney disease during this admission.  Nephrology was consulted.  Initially given high-dose furosemide without improvement.  Subsequently started on hemodialysis. ?Currently being dialyzed on a Monday Wednesday Friday schedule. ?We will check labs tomorrow. ? ?Chronic diastolic CHF (congestive heart failure) ([place]) ?Volume being managed with hemodialysis.   ?2D echocardiogram done 07/23/2021 shows preserved LV function with LV ejection fraction of 55 to 60% with no regional wall motion abnormality and grade 2 diastolic dysfunction. ? ?DM2 (diabetes mellitus, type 2) ([name]) ?Noted to be on SSI and glargine.   HbA1c 7.0.   ?CBGs were noted to be borderline low yesterday.  Glargine was held and dose was reduced.  Continue to monitor. ? ?HTN (hypertension) ?Drop in blood pressure noted while on dialysis.  Dose of hydralazine was decreased by nephrology.  Also remains on Imdur.   ?He was taken off of amlodipine and carvedilol previously.   ? ?Strongyloidiasis ?-Should have been effectively treated with Ivermectin x 2 rounds ?-F/u with ID and/or transplant as needed ? ? ? ?DVT Prophylaxis: Subcutaneous heparin ?Code Status: Full code ?Family Communication: Discussed with patient.  No family at  bedside.  We will call his wife later today. ?Disposition Plan: Inpatient rehabilitation was recommended by physical therapy.  Declined by [facility].  Consulted TOC to initiate SNF placement.   ? ? ?Status is: Inpatient ?Remains inpatient appropriate because: Progression to end-stage renal disease, acute respiratory failure ? ? ? ? ?Medications: Scheduled: ? amoxicillin-clavulanate  1 tablet Oral Q2000  ? aspirin  81 mg Oral Daily  ? calcitRIOL  0.25 mcg Oral QODAY  ? calcium acetate  1,334 mg Oral TID WC  ? calcium carbonate  2 tablet Oral QHS  ? Chlorhexidine Gluconate Cloth  6 each Topical Daily  ? Chlorhexidine Gluconate Cloth  6 each Topical Q0600  ? docusate sodium  100 mg Oral BID  ? feeding supplement (NEPRO CARB STEADY)  237 mL Oral BID BM  ? heparin  5,000 Units Subcutaneous Q8H  ? hydrALAZINE  25 mg Oral TID  ? insulin aspart  0-6 Units Subcutaneous TID WC  ? insulin glargine-yfgn  10 Units Subcutaneous Daily  ? isosorbide mononitrate  60 mg Oral QPM  ? meclizine  25 mg Oral BID  ? mouth rinse  15 mL Mouth Rinse BID  ? polyethylene glycol  17 g Oral Daily  ? senna-docusate  1 tablet Oral QHS  ? sodium chloride flush  3 mL Intravenous Q12H  ? ?Continuous: ? ? ?FXT:[id number] **OR** acetaminophen, albuterol, calcium carbonate (dosed in mg elemental calcium), docusate sodium, haloperidol lactate, hydrALAZINE, HYDROcodone-acetaminophen, ondansetron **OR** ondansetron (ZOFRAN) IV, phenol, senna-docusate, sorbitol, zolpidem ? ?Antibiotics: ?Anti-infectives (From admission, onward)  ? ? Start     Dose/Rate Route Frequency Ordered Stop  ? 07/29/21 1200  amoxicillin-clavulanate (AUGMENTIN) 500-125 MG per tablet 500 mg       ? 1 tablet Oral Daily 07/29/21 0914 08/05/21 1959  ? ?  ? ? ?Objective: ? ?Vital Signs ? ?Vitals:  ? 08/03/21 2015 08/04/21 0339 08/04/21 0500 08/04/21 0801  ?BP: (!) 159/77 131/74  (!) 145/82  ?Pulse: 81 87  83  ?Resp:  16  16  ?Temp: 98.5 ?F (36.9 ?C) 98.4 ?F (36.9 ?C)  98.1 ?F  (36.7 ?C)  ?TempSrc: Oral Oral    ?SpO2: 94% 96%  97%  ?Weight:   84.1 kg   ?Height:      ? ?No intake or output data in the 24 hours ending 08/04/21 0904 ? ?Filed Weights  ? 08/02/21 1230 08/03/21 0500 08/04/21 0500  ?Weight: 83 kg 85.6 kg 84.1 kg  ? ? ?General appearance: Awake alert.  In no distress ?Resp: Clear to auscultation bilaterally.  Normal effort ?Cardio: S1-S2 is normal regular.  No S3-S4.  No rubs murmurs or bruit ?GI: Abdomen is soft.  Nontender nondistended.  Bowel sounds are present normal.  No masses organomegaly ?Extremities: No edema.  ?Neurologic: Oriented to place person year. no focal neurological deficits.  ? ? ? ?Lab Results: ? ?Data Reviewed: I have personally reviewed labs and imaging study reports ? ?CBC: ?Recent Labs  ?Lab 07/29/21 ?0804 07/30/21 ?4268 07/31/21 ?0150 08/02/21 ?3419  ?WBC 9.9 12.3* 13.3* 13.1*  ?HGB 11.2* 14.3 13.9 15.7  ?HCT 35.2* 43.6 42.4 47.1  ?MCV 86.9 84.8 84.0 82.9  ?PLT 178 236 274 307  ? ? ? ?Basic Metabolic Panel: ?Recent Labs  ?Lab 07/29/21 ?0804 07/30/21 ?6222 07/31/21 ?0150 08/01/21 ?9798 08/02/21 ?9211  ?NA 135 134* 134* 133* 130*  ?K 4.0 4.0 4.0 4.7 4.2  ?CL 99 93* 94* 90* 89*  ?CO2 _0 ?GLUCOSE 99  112* 112* 151* 97  ?BUN 67* 47* 66* 63* 80*  ?CREATININE 6.09* 5.91* 7.24* 7.14* 7.88*  ?CALCIUM 7.1* 8.4* 8.5* 8.9 9.2  ?PHOS 4.3 4.9*  --  5.8* 5.7*  ? ? ? ?GFR: ?Estimated Creatinine Clearance: 8.9 mL/min (A) (by C-G formula based on SCr of 7.88 mg/dL (H)). ? ?Liver Function Tests: ?Recent Labs  ?Lab 07/29/21 ?0804 07/30/21 ?4128 08/01/21 ?7867 08/02/21 ?6720  ?ALBUMIN 2.8* 3.5 3.5 3.4*  ? ? ? ?CBG: ?Recent Labs  ?Lab 08/03/21 ?0753 08/03/21 ?1143 08/03/21 ?1614 08/03/21 ?2201 08/04/21 ?0818  ?GLUCAP 82 104* 98 229* 169*  ? ? ? ? ? ?No results found for this or any previous visit (from the past 240 hour(s)). ? ?  ? ? ?Radiology Studies: ?No results found. ? ? ? ? LOS: 12 days  ? ?Bonnielee Haff ? ?[facility] ?Pager on  [email or web address] ? ?08/04/2021, 9:04 AM ? ? ?

## 2021-08-04 NOTE — Progress Notes (Signed)
Subjective:  no issues overnight -  I guess now looking at SNF placement  ? ? ?Objective ?Vital signs in last 24 hours: ?Vitals:  ? 08/03/21 2015 08/04/21 0339 08/04/21 0500 08/04/21 0801  ?BP: (!) 159/77 131/74  (!) 145/82  ?Pulse: 81 87  83  ?Resp:  16  16  ?Temp: 98.5 ?F (36.9 ?C) 98.4 ?F (36.9 ?C)  98.1 ?F (36.7 ?C)  ?TempSrc: Oral Oral    ?SpO2: 94% 96%  97%  ?Weight:   84.1 kg   ?Height:      ? ?Weight change: -0.158 kg ?No intake or output data in the 24 hours ending 08/04/21 1016 ? ? ?Assessment/ Plan: ?Pt is a 71 y.o. yo male with advanced CKD at baseline who was admitted on 07/22/2021 with  respiratory failure--  dialysis has been initiated this hospitalization for progression to ESRD ?Assessment/Plan: ?1. Renal-  dialysis has been initiated this hospitalization for ESRD-  have been able to use his AVF- first treatment 3/23-   we now have him on a MWF schedule but OP schedule is going to be TTS at [facility].  Possibly next week do Mon/Thurs/Sat-  orders in for 4/3 ?2. HTN/volume-  was very volume overloaded to start-  have achieved much UF-  I think at EDW-  also had a thoracentesis this hosp.  Is still on hydralazine 50 TID-  will take it down to 25 TID and may be able to stop -  also on imdur ?3. MS-  has only been very slow to improve but is improving ?4. Anemia-  hgb has been great-  has not required any treatment  ?5. Secondary hyperparathyroidism-  on phoslo-  last phos 5.7-  also on calcitriol daily oral -  started as OP-  have not checked a PTH-  will order for Monday-  will need to be converted to dialysis dosing at discharge-  I think they use calcitriol at [facility] ?  ?6. Dispo- deconditioned-  tried for rehab-  did not get approved, now looking at SNF ? ? ?Louis Meckel  ? ? ?Labs: ?Basic Metabolic Panel: ?Recent Labs  ?Lab 07/30/21 ?0811 07/31/21 ?0150 08/01/21 ?1324 08/02/21 ?4010  ?NA 134* 134* 133* 130*  ?K 4.0 4.0 4.7 4.2  ?CL 93* 94* 90* 89*  ?CO2 '26 24 25 23  '$ ?GLUCOSE 112* 112* 151* 97  ?BUN  47* 66* 63* 80*  ?CREATININE 5.91* 7.24* 7.14* 7.88*  ?CALCIUM 8.4* 8.5* 8.9 9.2  ?PHOS 4.9*  --  5.8* 5.7*  ? ?Liver Function Tests: ?Recent Labs  ?Lab 07/30/21 ?0811 08/01/21 ?2725 08/02/21 ?3664  ?ALBUMIN 3.5 3.5 3.4*  ? ?No results for input(s): LIPASE, AMYLASE in the last 168 hours. ?No results for input(s): AMMONIA in the last 168 hours. ?CBC: ?Recent Labs  ?Lab 07/29/21 ?0804 07/30/21 ?4034 07/31/21 ?0150 08/02/21 ?7425  ?WBC 9.9 12.3* 13.3* 13.1*  ?HGB 11.2* 14.3 13.9 15.7  ?HCT 35.2* 43.6 42.4 47.1  ?MCV 86.9 84.8 84.0 82.9  ?PLT 178 236 274 307  ? ?Cardiac Enzymes: ?No results for input(s): CKTOTAL, CKMB, CKMBINDEX, TROPONINI in the last 168 hours. ?CBG: ?Recent Labs  ?Lab 08/03/21 ?0753 08/03/21 ?1143 08/03/21 ?1614 08/03/21 ?2201 08/04/21 ?0818  ?GLUCAP 82 104* 98 229* 169*  ? ? ?Iron Studies: No results for input(s): IRON, TIBC, TRANSFERRIN, FERRITIN in the last 72 hours. ?Studies/Results: ?No results found. ?Medications: ?Infusions: ? ? ?Scheduled Medications: ? amoxicillin-clavulanate  1 tablet Oral Q2000  ? aspirin  81 mg Oral Daily  ?  calcitRIOL  0.25 mcg Oral QODAY  ? calcium acetate  1,334 mg Oral TID WC  ? calcium carbonate  2 tablet Oral QHS  ? Chlorhexidine Gluconate Cloth  6 each Topical Daily  ? Chlorhexidine Gluconate Cloth  6 each Topical Q0600  ? docusate sodium  100 mg Oral BID  ? feeding supplement (NEPRO CARB STEADY)  237 mL Oral BID BM  ? heparin  5,000 Units Subcutaneous Q8H  ? hydrALAZINE  25 mg Oral TID  ? insulin aspart  0-6 Units Subcutaneous TID WC  ? insulin glargine-yfgn  10 Units Subcutaneous Daily  ? isosorbide mononitrate  60 mg Oral QPM  ? meclizine  25 mg Oral BID  ? mouth rinse  15 mL Mouth Rinse BID  ? polyethylene glycol  17 g Oral Daily  ? senna-docusate  1 tablet Oral QHS  ? sodium chloride flush  3 mL Intravenous Q12H  ? ? have reviewed scheduled and prn medications. ? ?Physical Exam: ?General:  NAD-  in bedside chair ?Heart: RRR ?Lungs: mostly clear ?Abdomen: soft,  non tender ?Extremities: no edema ?Dialysis Access: left upper AVF-  good thrill and bruit   ? ? ?08/04/2021,10:16 AM ? LOS: 12 days  ? ?  ? ? ? ? ?

## 2021-08-05 DIAGNOSIS — E1100 Type 2 diabetes mellitus with hyperosmolarity without nonketotic hyperglycemic-hyperosmolar coma (NKHHC): Secondary | ICD-10-CM | POA: Diagnosis not present

## 2021-08-05 DIAGNOSIS — G9341 Metabolic encephalopathy: Secondary | ICD-10-CM | POA: Diagnosis not present

## 2021-08-05 LAB — RENAL FUNCTION PANEL
Albumin: 3.1 g/dL — ABNORMAL LOW (ref 3.5–5.0)
Anion gap: 14 (ref 5–15)
BUN: 82 mg/dL — ABNORMAL HIGH (ref 8–23)
CO2: 26 mmol/L (ref 22–32)
Calcium: 8.8 mg/dL — ABNORMAL LOW (ref 8.9–10.3)
Chloride: 91 mmol/L — ABNORMAL LOW (ref 98–111)
Creatinine, Ser: 8.16 mg/dL — ABNORMAL HIGH (ref 0.61–1.24)
GFR, Estimated: 6 mL/min — ABNORMAL LOW (ref >60)
Glucose, Bld: 225 mg/dL — ABNORMAL HIGH (ref 70–99)
Phosphorus: 3.8 mg/dL (ref 2.5–4.6)
Potassium: 3.8 mmol/L (ref 3.5–5.1)
Sodium: 131 mmol/L — ABNORMAL LOW (ref 135–145)

## 2021-08-05 LAB — CBC
HCT: 39.9 % (ref 39.0–52.0)
Hemoglobin: 13 g/dL (ref 13.0–17.0)
MCH: 27.6 pg (ref 26.0–34.0)
MCHC: 32.6 g/dL (ref 30.0–36.0)
MCV: 84.7 fL (ref 80.0–100.0)
Platelets: 266 10*3/uL (ref 150–400)
RBC: 4.71 MIL/uL (ref 4.22–5.81)
RDW: 14.8 % (ref 11.5–15.5)
WBC: 11.4 10*3/uL — ABNORMAL HIGH (ref 4.0–10.5)
nRBC: 0 % (ref 0.0–0.2)

## 2021-08-05 LAB — GLUCOSE, CAPILLARY
Glucose-Capillary: 235 mg/dL — ABNORMAL HIGH (ref 70–99)
Glucose-Capillary: 210 mg/dL — ABNORMAL HIGH (ref 70–99)
Glucose-Capillary: 122 mg/dL — ABNORMAL HIGH (ref 70–99)
Glucose-Capillary: 118 mg/dL — ABNORMAL HIGH (ref 70–99)
Glucose-Capillary: 182 mg/dL — ABNORMAL HIGH (ref 70–99)
Glucose-Capillary: 197 mg/dL — ABNORMAL HIGH (ref 70–99)

## 2021-08-05 MED ORDER — HEPARIN SODIUM (PORCINE) 1000 UNIT/ML DIALYSIS: 20.0000 [IU]/kg | INTRAMUSCULAR | Status: DC | PRN

## 2021-08-05 MED ORDER — KETOTIFEN FUMARATE 0.025 % OP SOLN
1.0000 [drp] | Freq: Two times a day (BID) | OPHTHALMIC | Status: DC
Start: 2021-08-05 — End: 2021-08-12
  Administered 2021-08-05 – 2021-08-12 (×15): 1 [drp] via OPHTHALMIC
  Filled 2021-08-05: qty 5

## 2021-08-05 MED ORDER — INSULIN GLARGINE-YFGN 100 UNIT/ML ~~LOC~~ SOLN
14.0000 [IU] | Freq: Every day | SUBCUTANEOUS | Status: DC
  Administered 2021-08-06 – 2021-08-08 (×3): 14 [IU] via SUBCUTANEOUS
  Filled 2021-08-05 (×3): qty 0.14

## 2021-08-05 MED ORDER — DIPHENHYDRAMINE HCL 25 MG PO CAPS
25.0000 mg | ORAL_CAPSULE | Freq: Once | ORAL | Status: AC
  Administered 2021-08-05: 25 mg via ORAL
  Filled 2021-08-05: qty 1

## 2021-08-05 NOTE — Progress Notes (Signed)
? ?[facility] ?PROGRESS NOTE ? ? ?Melvin Mcclure VVO:[id number] DOB: 1951/02/08 DOA: 07/22/2021  13 ?DOS: the patient was seen and examined on 08/05/2021 ? ?PCP: Nolene Ebbs, MD ? ?Brief History and Hospital Course:  ?71 y.o. 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  Apparently uses 2 L of oxygen by nasal cannula at home as needed.  Patient with history of recurrent pleural effusion.  Seen by pulmonology.  Had high oxygen requirements.  Was treated transferred to the ICU.  Underwent diagnostic and therapeutic thoracocentesis with removal of 1500 MLS of fluid on 07/23/2021.  Patient then underwent flexible bronchoscopy with brushing and bronchoalveolar lavage and transbronchial lung biopsy on 07/24/2021 for nonresolving opacity with recurrent pleural effusion. Required BiPAP.  Then transitioned to high flow nasal cannula.  BiPAP recommended at nighttime by pulmonology which the patient has been refusing.  Subsequently transferred back to floor.  Mentation has been gradually improving.  Remains deconditioned. ? ? ?Consultants: Pulmonology.  Nephrology ? ?Procedures: Thoracentesis.  Bronchoscopy ? ? ?Subjective: ?Patient somnolent this morning.  Easily arousable.  Wife is at the bedside. ? ? ?Assessment/Plan: ? ? ?* Acute on chronic respiratory failure with hypoxia ([place]) ?Recurrent pleural effusion ?Chronic bibasilar infiltrate ? ?Likely secondary to recurrent pleural effusion.  Underwent thoracentesis.  Seen by pulmonology.   ?Pleural effusion likely due to combination of fluid overload as well as infection. ?Status post thoracocentesis on  07/23/2021 with removal of 1500 mL of pleural fluid.  Pleural fluid culture negative in 24 hours.  Additionally, patient underwent flexible  bronchoscopy with brushing and bronchoalveolar lavage and transbronchial lung biopsy on 07/24/2021.  ?BAL positive for Prevotella.  Completed course of Augmentin. Follow fungal cultures (still pending as of 4/3).   ?Patient remains afebrile.  WBC better but minimally elevated.  ?Respiratory status has improved.  Now saturating normal on room air.  ? ?Acute metabolic encephalopathy ?Likely ICU delirium component to confusion/lethargy vs uremia ?Now on hemodialysis.   ?Mentation is gradually improving.  No focal neurological deficits.   ? ?Stage 5 chronic kidney disease ([place]) ?Progression to End-stage renal disease ?Hyponatremia ? ?Progression of kidney disease during this admission.  Nephrology was consulted.  Initially given high-dose furosemide without improvement.  Subsequently started on hemodialysis. ?Currently being dialyzed on a Monday Wednesday Friday schedule. ? ?Chronic diastolic CHF (congestive heart failure) ([place]) ?Volume being managed with hemodialysis.   ?2D echocardiogram done 07/23/2021 shows preserved LV function with LV ejection fraction of 55 to 60% with no regional wall motion abnormality and grade 2 diastolic dysfunction. ? ?DM2 (diabetes mellitus, type 2) ([place]) ?Noted to be on SSI and glargine.   HbA1c 7.0.   ?CBGs were noted to be borderline low 2 days ago and so the dose of glargine was decreased.  CBGs noted to be better and hyperglycemic this morning.  May need dose adjustment. ? ?HTN (hypertension) ?Drop in blood pressure noted while on dialysis.  Dose of hydralazine was decreased by nephrology.  Also remains on Imdur.   ?He was taken off of amlodipine and carvedilol previously.   ? ?Strongyloidiasis ?-Should have been effectively treated with Ivermectin x 2 rounds ?-F/u with ID and/or transplant as needed ? ? ? ?DVT Prophylaxis: Subcutaneous heparin ?Code Status: Full code ?Family Communication:  discussed with patient and his wife  ?Disposition Plan: Inpatient rehabilitation was recommended  by physical  therapy.  Declined by [facility].  Consulted TOC to initiate SNF placement.   ? ? ?Status is: Inpatient ?Remains inpatient appropriate because: Progression to end-stage renal disease, acute respiratory failure ? ? ? ? ?Medications: Scheduled: ? aspirin  81 mg Oral Daily  ? calcitRIOL  0.25 mcg Oral QODAY  ? calcium acetate  1,334 mg Oral TID WC  ? calcium carbonate  2 tablet Oral QHS  ? Chlorhexidine Gluconate Cloth  6 each Topical Q0600  ? docusate sodium  100 mg Oral BID  ? feeding supplement (NEPRO CARB STEADY)  237 mL Oral BID BM  ? heparin  5,000 Units Subcutaneous Q8H  ? hydrALAZINE  25 mg Oral TID  ? insulin aspart  0-6 Units Subcutaneous TID WC  ? insulin glargine-yfgn  10 Units Subcutaneous Daily  ? isosorbide mononitrate  60 mg Oral QPM  ? ketotifen  1 drop Both Eyes BID  ? meclizine  25 mg Oral BID  ? mouth rinse  15 mL Mouth Rinse BID  ? polyethylene glycol  17 g Oral Daily  ? senna-docusate  1 tablet Oral QHS  ? sodium chloride flush  3 mL Intravenous Q12H  ? ?Continuous: ? ? ?JSH:FWYOVZCHYIFOY **OR** acetaminophen, albuterol, calcium carbonate (dosed in mg elemental calcium), docusate sodium, haloperidol lactate, heparin, hydrALAZINE, HYDROcodone-acetaminophen, ondansetron **OR** ondansetron (ZOFRAN) IV, phenol, senna-docusate, sorbitol, zolpidem ? ?Antibiotics: ?Anti-infectives (From admission, onward)  ? ? Start     Dose/Rate Route Frequency Ordered Stop  ? 07/29/21 1200  amoxicillin-clavulanate (AUGMENTIN) 500-125 MG per tablet 500 mg       ? 1 tablet Oral Daily 07/29/21 0914 08/04/21 2021  ? ?  ? ? ?Objective: ? ?Vital Signs ? ?Vitals:  ? 08/04/21 1535 08/04/21 2019 08/05/21 0338 08/05/21 0746  ?BP: 140/75 (!) 141/65 (!) 150/77 136/74  ?Pulse: 80 76 77 81  ?Resp: _0 ?Temp: 97.8 ?F (36.6 ?C) 98.1 ?F (36.7 ?C) 98 ?F (36.7 ?C) 98.1 ?F (36.7 ?C)  ?TempSrc:  Oral Oral   ?SpO2: 96% 98% 98% 93%  ?Weight:      ?Height:      ? ?No intake or output data in the 24 hours ending  08/05/21 0923 ? ?Filed Weights  ? 08/02/21 1230 08/03/21 0500 08/04/21 0500  ?Weight: 83 kg 85.6 kg 84.1 kg  ? ? ?Somnolent.  Arousable.  Does not want to be bothered today.  Not fully examined. ? ?Lab Results: ? ?Data Reviewed: I have personally reviewed labs and imaging study reports ? ?CBC: ?Recent Labs  ?Lab 07/30/21 ?7741 07/31/21 ?0150 08/02/21 ?2878 08/05/21 ?0631  ?WBC 12.3* 13.3* 13.1* 11.4*  ?HGB 14.3 13.9 15.7 13.0  ?HCT 43.6 42.4 47.1 39.9  ?MCV 84.8 84.0 82.9 84.7  ?PLT 236 274 307 266  ? ? ? ?Basic Metabolic Panel: ?Recent Labs  ?Lab 07/30/21 ?0811 07/31/21 ?0150 08/01/21 ?6767 08/02/21 ?2094 08/05/21 ?0631  ?NA 134* 134* 133* 130* 131*  ?K 4.0 4.0 4.7 4.2 3.8  ?CL 93* 94* 90* 89* 91*  ?CO2 _1 ?GLUCOSE 112* 112* 151* 97 225*  ?BUN 47* 66* 63* 80* 82*  ?CREATININE 5.91* 7.24* 7.14* 7.88* 8.16*  ?CALCIUM 8.4* 8.5* 8.9 9.2 8.8*  ?PHOS 4.9*  --  5.8* 5.7* 3.8  ? ? ? ?GFR: ?Estimated Creatinine Clearance: 8.6 mL/min (A) (by C-G formula based on SCr of 8.16 mg/dL (H)). ? ?Liver Function Tests: ?Recent Labs  ?Lab 07/30/21 ?0811 08/01/21 ?7096 08/02/21 ?2836 08/05/21 ?0631  ?  ALBUMIN 3.5 3.5 3.4* 3.1*  ? ? ? ?CBG: ?Recent Labs  ?Lab 08/04/21 ?0263 08/04/21 ?1325 08/04/21 ?1537 08/04/21 ?2041 08/05/21 ?0743  ?GLUCAP 169* 188* 152* 173* 235*  ? ? ? ? ? ?No results found for this or any previous visit (from the past 240 hour(s)). ? ?  ? ? ?Radiology Studies: ?No results found. ? ? ? ? LOS: 13 days  ? ?Bonnielee Haff ? ?[facility] ?Pager on [email or web address] ? ?08/05/2021, 9:23 AM ? ? ?

## 2021-08-05 NOTE — [place] FL2 (Addendum)
?[place] MEDICAID FL2 LEVEL OF CARE SCREENING TOOL  ?  ? ?IDENTIFICATION  ?Patient Name: ?Melvin Mcclure Birthdate: [birth year] Sex: male Admission Date (Current Location): ?07/22/2021  ?[place] and [place] Number: ? [place] ?  Facility and Address:  ?[facility], [place] [address] [place], [place] [address] ?     Provider Number: ?[id number]  ?Attending Physician Name and Address:  ?Bonnielee Haff, MD ? Relative Name and Phone Number:  ?Kayshawn, [place] [phone] ?   ?Current Level of Care: ?Hospital Recommended Level of Care: ?[place] Prior Approval Number: ?  ? ?Date Approved/Denied: ?  PASRR Number: ? [id number] A ? ?Discharge Plan: ?SNF ?  ? ?Current Diagnoses: ?Patient Active Problem List  ? Diagnosis Date Noted  ? Acute metabolic encephalopathy 64/68/0321  ? Acute on chronic respiratory failure with hypoxia ([place]) 07/22/2021  ? Recurrent pleural effusion on right 06/16/2021  ? Strongyloidiasis 06/16/2021  ? Abnormal findings on diagnostic imaging of lung 04/30/2020  ? Chronic respiratory failure with hypoxia ([place]) 04/17/2020  ? Pleural effusion on right 01/17/2020  ? HTN (hypertension) 01/17/2020  ? Acute exacerbation of CHF (congestive heart failure) ([place]) 12/23/2019  ? Hypocalcemia 12/23/2019  ? Anemia of chronic disease 12/23/2019  ? Vertigo 12/23/2019  ? Acute respiratory failure with hypoxia ([place]) 12/23/2019  ? CAP (community acquired pneumonia) 10/28/2019  ? Hypertensive cardiomyopathy, with heart failure ([place]) 06/15/2018  ? Chronic diastolic CHF (congestive heart failure) ([place]) 05/28/2018  ? DM2 (diabetes mellitus, type 2) ([place]) 05/28/2018  ? Stage 5 chronic kidney disease ([place]) 05/28/2018  ? CHF (congestive heart failure) ([place]) 05/28/2018  ? ? ?Orientation RESPIRATION BLADDER Height & Weight   ?  ?Self, Time, Situation, Place ? Normal Continent, External catheter Weight: 194 lb 7.1 oz (88.2 kg) ?Height:  '5\' 10"'$  (177.8 cm)  ?BEHAVIORAL SYMPTOMS/MOOD  NEUROLOGICAL BOWEL NUTRITION STATUS  ?    Continent Diet (see discharge summary)  ?AMBULATORY STATUS COMMUNICATION OF NEEDS Skin   ?Limited Assist Verbally Normal ?  ?  ?  ?    ?     ?     ? ? ?Personal Care Assistance Level of Assistance  ?Bathing, Feeding, Dressing Bathing Assistance: Limited assistance ?Feeding assistance: Limited assistance ?Dressing Assistance: Limited assistance ?   ? ?Functional Limitations Info  ?Sight, Hearing, Speech Sight Info: Adequate ?Hearing Info: Adequate ?Speech Info: Adequate  ? ? ?SPECIAL CARE FACTORS FREQUENCY  ?PT (By licensed PT), OT (By licensed OT)   ?  ?PT Frequency: 5x week ?OT Frequency: 5x week ?  ?  ?  ?   ? ? ?Contractures Contractures Info: Not present  ? ? ?Additional Factors Info  ?Code Status, Allergies, Insulin Sliding Scale Code Status Info: full ?Allergies Info: NKA ?  ?Insulin Sliding Scale Info: Novolog, 0-6 units TID with meals.  See discharge summary ?  ?   ? ?Current Medications (08/05/2021):  This is the current hospital active medication list ?Current Facility-Administered Medications  ?Medication Dose Route Frequency Provider Last Rate Last Admin  ? acetaminophen (TYLENOL) tablet 650 mg  650 mg Oral Q6H PRN Karmen Bongo, MD   650 mg at 08/05/21 2248  ? Or  ? acetaminophen (TYLENOL) suppository 650 mg  650 mg Rectal Q6H PRN Karmen Bongo, MD      ? albuterol (PROVENTIL) (2.5 MG/3ML) 0.083% nebulizer solution 2.5 mg  2.5 mg Nebulization Q2H PRN Karmen Bongo, MD      ? aspirin chewable tablet 81 mg  81 mg Oral Daily Yates,  Anderson Malta, MD   81 mg at 08/05/21 0737  ? calcitRIOL (ROCALTROL) capsule 0.25 mcg  0.25 mcg Oral Cathlean Sauer, MD   0.25 mcg at 08/04/21 1062  ? calcium acetate (PHOSLO) capsule 1,334 mg  1,334 mg Oral TID WC Gean Quint, MD   1,334 mg at 08/05/21 0839  ? calcium carbonate (dosed in mg elemental calcium) suspension 500 mg of elemental calcium  500 mg of elemental calcium Oral Q6H PRN Karmen Bongo, MD      ? calcium  carbonate (TUMS - dosed in mg elemental calcium) chewable tablet 400 mg of elemental calcium  2 tablet Oral QHS Karmen Bongo, MD   400 mg of elemental calcium at 08/04/21 2021  ? Chlorhexidine Gluconate Cloth 2 % PADS 6 each  6 each Topical Q0600 Corliss Parish, MD   6 each at 08/04/21 1145  ? docusate sodium (COLACE) capsule 100 mg  100 mg Oral BID Candee Furbish, MD   100 mg at 08/04/21 2020  ? docusate sodium (ENEMEEZ) enema 283 mg  1 enema Rectal PRN Karmen Bongo, MD      ? feeding supplement (NEPRO CARB STEADY) liquid 237 mL  237 mL Oral BID BM Candee Furbish, MD   237 mL at 08/04/21 0825  ? haloperidol lactate (HALDOL) injection 5 mg  5 mg Intravenous Q6H PRN Candee Furbish, MD   5 mg at 07/31/21 6948  ? heparin injection 1,700 Units  20 Units/kg Dialysis PRN Corliss Parish, MD      ? heparin injection 5,000 Units  5,000 Units Subcutaneous Lynne Logan, MD   5,000 Units at 08/05/21 5462  ? hydrALAZINE (APRESOLINE) injection 5 mg  5 mg Intravenous Q4H PRN Karmen Bongo, MD      ? hydrALAZINE (APRESOLINE) tablet 25 mg  25 mg Oral TID Corliss Parish, MD   25 mg at 08/05/21 7035  ? HYDROcodone-acetaminophen (NORCO/VICODIN) 5-325 MG per tablet 1 tablet  1 tablet Oral Q4H PRN Karmen Bongo, MD   1 tablet at 08/03/21 2150  ? insulin aspart (novoLOG) injection 0-6 Units  0-6 Units Subcutaneous TID WC Karmen Bongo, MD   2 Units at 08/05/21 [phone]  ? [START ON 08/06/2021] insulin glargine-yfgn (SEMGLEE) injection 14 Units  14 Units Subcutaneous Daily Bonnielee Haff, MD      ? isosorbide mononitrate (IMDUR) 24 hr tablet 60 mg  60 mg Oral QPM Karmen Bongo, MD   60 mg at 08/04/21 1703  ? ketotifen (ZADITOR) 0.025 % ophthalmic solution 1 drop  1 drop Both Eyes BID Lang Snow, NP   1 drop at 08/05/21 0900  ? meclizine (ANTIVERT) tablet 25 mg  25 mg Oral BID Karmen Bongo, MD   25 mg at 08/05/21 0846  ? MEDLINE mouth rinse  15 mL Mouth Rinse BID Pokhrel, Laxman, MD   15  mL at 08/05/21 0841  ? ondansetron (ZOFRAN) tablet 4 mg  4 mg Oral Q6H PRN Karmen Bongo, MD      ? Or  ? ondansetron [facility]) injection 4 mg  4 mg Intravenous Q6H PRN Karmen Bongo, MD   4 mg at 07/31/21 0224  ? phenol (CHLORASEPTIC) mouth spray 1 spray  1 spray Mouth/Throat PRN Pokhrel, Laxman, MD   1 spray at 07/24/21 1444  ? polyethylene glycol (MIRALAX / GLYCOLAX) packet 17 g  17 g Oral Daily Candee Furbish, MD   17 g at 08/04/21 0820  ? senna-docusate (Senokot-S) tablet 1 tablet  1 tablet Oral  BID PRN Karmen Bongo, MD      ? senna-docusate (Senokot-S) tablet 1 tablet  1 tablet Oral QHS Candee Furbish, MD   1 tablet at 08/04/21 2020  ? sodium chloride flush (NS) 0.9 % injection 3 mL  3 mL Intravenous Q12H Karmen Bongo, MD   3 mL at 08/05/21 0842  ? sorbitol 70 % solution 30 mL  30 mL Oral PRN Karmen Bongo, MD      ? zolpidem Lorrin Mais) tablet 5 mg  5 mg Oral QHS PRN Karmen Bongo, MD   5 mg at 08/04/21 2020  ? ? ? ?Discharge Medications: ?Please see discharge summary for a list of discharge medications. ? ?Relevant Imaging Results: ? ?Relevant Lab Results: ? ? ?Additional Information ?SSN: [id number].  Pt is vaccinated for covid with 3 boosters.  Pt is also new HD with TTS schedule. ? ?Joanne Chars, LCSW ? ? ? ? ?

## 2021-08-05 NOTE — Progress Notes (Signed)
Inpatient Rehab Admissions Coordinator:  ? ? Pt.'s case for CIR was denied by [facility]. Pt.'s wife was notified and she wishes to appeal. I will file appeal as soon as Pt. Is seen by OT/PT as they require updated therapy notes. Pt.'s wife also made aware that likelihood of winning appeal is low and that she needs to be working with TOC to identify a backup option.  ? ?Clemens Catholic, MS, CCC-SLP ?Rehab Admissions Coordinator  ?[phone] (celll) ?[phone] (office) ? ?

## 2021-08-05 NOTE — Progress Notes (Signed)
Subjective:  Seen and examined on HD.  BFR 350 mL/ min via L AVF 2 16 g needles in place.   ? ? ?Objective ?Vital signs in last 24 hours: ?Vitals:  ? 08/05/21 1400 08/05/21 1430 08/05/21 1500 08/05/21 1530  ?BP: 138/63 125/60 138/73 (!) 155/77  ?Pulse: 74 75 73 75  ?Resp: 15     ?Temp:      ?TempSrc:      ?SpO2:      ?Weight:      ?Height:      ? ?Weight change:  ?No intake or output data in the 24 hours ending 08/05/21 1557 ? ? ?Assessment/ Plan: ?Pt is a 71 y.o. yo male with advanced CKD at baseline who was admitted on 07/22/2021 with  respiratory failure--  dialysis has been initiated this hospitalization for progression to ESRD ?Assessment/Plan: ?1. Renal-  dialysis has been initiated this hospitalization for ESRD-  have been able to use his AVF- first treatment 3/23-   we now have him on a MWF schedule but OP schedule is going to be TTS at [facility].  Will try to do Mon/Thurs/Sat this week ?2. HTN/volume-  was very volume overloaded to start-  have achieved much UF-  I think at EDW-  also had a thoracentesis this hosp.  Is still on hydralazine 50 TID-  will take it down to 25 TID and may be able to stop -  also on imdur ?3. MS-  has only been very slow to improve but is improving--> still with some things that don't quite make sense ?4. Anemia-  hgb has been great-  has not required any treatment  ?5. Secondary hyperparathyroidism-  on phoslo-  last phos 5.7-  also on calcitriol daily oral -  started as OP-  have not checked a PTH-  will order for Monday-  will need to be converted to dialysis dosing at discharge-  I think they use calcitriol at [facility] ?  ?6. Dispo- deconditioned-  tried for rehab-  did not get approved, now looking at SNF ? ? ?Barri Neidlinger  ? ? ?Labs: ?Basic Metabolic Panel: ?Recent Labs  ?Lab 08/01/21 ?6213 08/02/21 ?0865 08/05/21 ?0631  ?NA 133* 130* 131*  ?K 4.7 4.2 3.8  ?CL 90* 89* 91*  ?CO2 '25 23 26  '$ ?GLUCOSE 151* 97 225*  ?BUN 63* 80* 82*  ?CREATININE 7.14* 7.88* 8.16*  ?CALCIUM 8.9 9.2 8.8*   ?PHOS 5.8* 5.7* 3.8  ? ?Liver Function Tests: ?Recent Labs  ?Lab 08/01/21 ?7846 08/02/21 ?9629 08/05/21 ?0631  ?ALBUMIN 3.5 3.4* 3.1*  ? ?No results for input(s): LIPASE, AMYLASE in the last 168 hours. ?No results for input(s): AMMONIA in the last 168 hours. ?CBC: ?Recent Labs  ?Lab 07/30/21 ?5284 07/31/21 ?0150 08/02/21 ?1324 08/05/21 ?0631  ?WBC 12.3* 13.3* 13.1* 11.4*  ?HGB 14.3 13.9 15.7 13.0  ?HCT 43.6 42.4 47.1 39.9  ?MCV 84.8 84.0 82.9 84.7  ?PLT 236 274 307 266  ? ?Cardiac Enzymes: ?No results for input(s): CKTOTAL, CKMB, CKMBINDEX, TROPONINI in the last 168 hours. ?CBG: ?Recent Labs  ?Lab 08/04/21 ?1325 08/04/21 ?1537 08/04/21 ?2041 08/05/21 ?4010 08/05/21 ?1135  ?GLUCAP 188* 152* 173* 235* 210*  ? ? ?Iron Studies: No results for input(s): IRON, TIBC, TRANSFERRIN, FERRITIN in the last 72 hours. ?Studies/Results: ?No results found. ?Medications: ?Infusions: ? ? ?Scheduled Medications: ? aspirin  81 mg Oral Daily  ? calcitRIOL  0.25 mcg Oral QODAY  ? calcium acetate  1,334 mg Oral TID WC  ?  calcium carbonate  2 tablet Oral QHS  ? Chlorhexidine Gluconate Cloth  6 each Topical Q0600  ? docusate sodium  100 mg Oral BID  ? feeding supplement (NEPRO CARB STEADY)  237 mL Oral BID BM  ? heparin  5,000 Units Subcutaneous Q8H  ? hydrALAZINE  25 mg Oral TID  ? insulin aspart  0-6 Units Subcutaneous TID WC  ? [START ON 08/06/2021] insulin glargine-yfgn  14 Units Subcutaneous Daily  ? isosorbide mononitrate  60 mg Oral QPM  ? ketotifen  1 drop Both Eyes BID  ? meclizine  25 mg Oral BID  ? mouth rinse  15 mL Mouth Rinse BID  ? polyethylene glycol  17 g Oral Daily  ? senna-docusate  1 tablet Oral QHS  ? sodium chloride flush  3 mL Intravenous Q12H  ? ? have reviewed scheduled and prn medications. ? ?Physical Exam: ?General:  NAD-  in bedside chair ?Heart: RRR ?Lungs: mostly clear ?Abdomen: soft, non tender ?Extremities: no edema ?Dialysis Access: left upper AVF-  good thrill and bruit   ? ? ?08/05/2021,3:57 PM ? LOS: 13 days   ? ?  ? ? ? ? ?

## 2021-08-05 NOTE — Progress Notes (Signed)
Physical Therapy Treatment ?Patient Details ?Name: Melvin Mcclure ?MRN: [id number] ?DOB: 1950/08/21 ?Today's Date: 08/05/2021 ? ? ?History of Present Illness Pt is a 71 y.o. male admitted 07/22/21 with worsening SOB; workup for acute on chronic respiratory failure with recurrent pleural effusion initially requiring bipap. S/p thoracentesis 3/21. S/p RLL bronchoscopy, bronchoalveolar lavage, transbronchial lung biopsy 3/22. Course complicated by AKI on CKD; iHD initiated 3/23. Pt also with acute methabolic encephalopathy ; question ICU delirium vs uremia. PMH includes CKD 5 (undergoing transplant evaluation), HFpEF, DM2. ? ?  ?PT Comments  ? ? Pt admitted with above diagnosis. Pt was able to incr distance today walking with RW however continues with decr safety awareness needing min guard assist but mod cues as he is impulsive at times and needs cues to use RW safely and for postural stability. As PT was typing note, pt got up on his own and began to walk toward hall and nurse had to stop him.  Pt with poor awareness of safety.  His cognition is poor and he needs cognitive Rehab, PT and OT.  He needed frequent redirection to tasks and cues for memory. Will continue acute PT.  Pt currently with functional limitations due to balance and endurance deficits. Would benefit from Rehab stay to decr burden of care on wife at home. Pt will benefit from skilled PT to increase their independence and safety with mobility to allow discharge to the venue listed below.      ?Recommendations for follow up therapy are one component of a multi-disciplinary discharge planning process, led by the attending physician.  Recommendations may be updated based on patient status, additional functional criteria and insurance authorization. ? ?Follow Up Recommendations ? Acute inpatient rehab (3hours/day) ?  ?  ?Assistance Recommended at Discharge Frequent or constant Supervision/Assistance  ?Patient can return home with the following A little help  with walking and/or transfers;A little help with bathing/dressing/bathroom;Assistance with cooking/housework;Direct supervision/assist for medications management;Direct supervision/assist for financial management;Help with stairs or ramp for entrance ?  ?Equipment Recommendations ? Rolling walker (2 wheels);BSC/3in1;Wheelchair (measurements PT);Wheelchair cushion (measurements PT)  ?  ?Recommendations for Other Services Rehab consult ? ? ?  ?Precautions / Restrictions Precautions ?Precautions: Fall;Other (comment) ?Restrictions ?Weight Bearing Restrictions: No  ?  ? ?Mobility ? Bed Mobility ?  ?  ?  ?  ?  ?  ?  ?General bed mobility comments: sitting EOB on arrival.  Needed a little assist to don shoes. ?  ? ?Transfers ?Overall transfer level: Needs assistance ?Equipment used: Rolling walker (2 wheels) ?Transfers: Sit to/from Stand ?Sit to Stand: Min assist (mod cues) ?  ?  ?  ?  ?  ?General transfer comment: multimodal cues for technique, min assist to rise and steady, mod cues for hand placement as pt forgets quickly after cues are given therefore needs repetition. ?  ? ?Ambulation/Gait ?Ambulation/Gait assistance: Min assist, +2 safety/equipment, Min guard (mod verbal cues) ?Gait Distance (Feet): 95 Feet ?Assistive device: Rolling walker (2 wheels) ?Gait Pattern/deviations: Step-through pattern, Decreased stride length, Trunk flexed, Wide base of support, Shuffle, Decreased step length - right, Decreased step length - left ?Gait velocity: Decreased ?Gait velocity interpretation: <1.8 ft/sec, indicate of risk for recurrent falls ?  ?General Gait Details: Slow gait with RW and min assist for stability, RW management and mod verbal cues for sequencing, including upright posture and maintaining closer proximity to RW.  Pt needs frequent repetition of cues as he is distracted easily and gets off task quickly. No LOB with  RW as long as pt receives mod cuing however once in the bathroom, pt almost left RW as he forgot  to grab it when heading out.  Poor cognition contributes to pt needing incr cues and assist for safety. ? ? ?Stairs ?  ?  ?  ?  ?  ? ? ?Wheelchair Mobility ?  ? ?Modified Rankin (Stroke Patients Only) ?  ? ? ?  ?Balance Overall balance assessment: Needs assistance ?Sitting-balance support: No upper extremity supported, Feet supported ?Sitting balance-Leahy Scale: Fair ?  ?  ?Standing balance support: Bilateral upper extremity supported, During functional activity ?Standing balance-Leahy Scale: Poor ?Standing balance comment: B UE support and up to min assist and mod cues ?  ?  ?  ?  ?  ?  ?  ?  ?  ?  ?  ?  ? ?  ?Cognition Arousal/Alertness: Awake/alert ?Behavior During Therapy: Flat affect ?Overall Cognitive Status: Impaired/Different from baseline ?Area of Impairment: Orientation, Attention, Memory, Following commands, Safety/judgement, Awareness, Problem solving ?  ?  ?  ?  ?  ?  ?  ?  ?Orientation Level: Disoriented to, Time, Situation ?Current Attention Level: Sustained ?Memory: Decreased recall of precautions, Decreased short-term memory ?Following Commands: Follows one step commands inconsistently, Follows one step commands with increased time ?Safety/Judgement: Decreased awareness of safety, Decreased awareness of deficits ?Awareness: Intellectual ?Problem Solving: Slow processing, Decreased initiation, Difficulty sequencing, Requires verbal cues, Requires tactile cues ?General Comments: pt pleasant and eager to participate, needs multimodal cues and increased time to follow commands, off topic conversation ?  ?  ? ?  ?Exercises   ? ?  ?General Comments General comments (skin integrity, edema, etc.): Pt went into the bathroom and washed face and hair, brushed hair and beard and brushed teeth.  Pt could not recall that PT and OT had dicussed him getting his toothbrush and toothpaste therefore ultimately needed assist to get the toothbrush and toothpaste. ?  ?  ? ?Pertinent Vitals/Pain Pain Assessment ?Pain  Assessment: No/denies pain  ? ? ?Home Living   ?  ?  ?  ?  ?  ?  ?  ?  ?  ?   ?  ?Prior Function    ?  ?  ?   ? ?PT Goals (current goals can now be found in the care plan section) Acute Rehab PT Goals ?Patient Stated Goal: to go home ?Progress towards PT goals: Progressing toward goals ? ?  ?Frequency ? ? ? Min 4X/week ? ? ? ?  ?PT Plan Current plan remains appropriate  ? ? ?Co-evaluation PT/OT/SLP Co-Evaluation/Treatment: Yes ?Reason for Co-Treatment: For patient/therapist safety;Complexity of the patient's impairments (multi-system involvement);Necessary to address cognition/behavior during functional activity ?PT goals addressed during session: Mobility/safety with mobility;Balance;Proper use of DME ?  ?  ? ?  ?AM-PAC PT "6 Clicks" Mobility   ?Outcome Measure ? Help needed turning from your back to your side while in a flat bed without using bedrails?: A Little ?Help needed moving from lying on your back to sitting on the side of a flat bed without using bedrails?: A Little ?Help needed moving to and from a bed to a chair (including a wheelchair)?: A Lot ?Help needed standing up from a chair using your arms (e.g., wheelchair or bedside chair)?: A Lot ?Help needed to walk in hospital room?: A Lot ?Help needed climbing 3-5 steps with a railing? : Total ?6 Click Score: 13 ? ?  ?End of Session Equipment Utilized During Treatment: Gait  belt ?Activity Tolerance: Patient tolerated treatment well;Patient limited by fatigue ?Patient left: in chair;with call bell/phone within reach;with chair alarm set;with family/visitor present ?Nurse Communication: Mobility status ?PT Visit Diagnosis: Unsteadiness on feet (R26.81);Muscle weakness (generalized) (M62.81) ?  ? ? ?Time: 3524-8185 ?PT Time Calculation (min) (ACUTE ONLY): 27 min ? ?Charges:  $Gait Training: 8-22 mins          ?          ? ?Kelle Ruppert M,PT ?Acute Rehab Services ?[phone] ?[phone] (pager)  ? ? ?Alvira Philips ?08/05/2021, 11:47 AM ? ?

## 2021-08-05 NOTE — TOC Initial Note (Signed)
Transition of Care (TOC) - Initial/Assessment Note  ? ? ?Patient Details  ?Name: Melvin Mcclure ?MRN: [id number] ?Date of Birth: [birth year] ? ?Transition of Care [facility]) CM/SW Contact:    ?Joanne Chars, LCSW ?Phone Number: ?08/05/2021, 3:58 PM ? ?Clinical Narrative:  Pt auth for CIR denied by [facility], currently under appeal, pt at HD today, CSW spoke with pt wife   Hector Shade who is agreeable to SNF for back up plan.   ? ?Referral sent out in hub for SNF.            ? ? ?Expected Discharge Plan: [place] ?Barriers to Discharge: Other (must enter comment), SNF Pending bed offer (auth denied by insurance for CIR, under appeal) ? ? ?Patient Goals and CMS Choice ?  ?CMS Medicare.gov Compare Post Acute Care list provided to:: Patient Represenative (must comment) ?Choice offered to / list presented to : Spouse ? ?Expected Discharge Plan and Services ?Expected Discharge Plan: [place] ?In-house Referral: Clinical Social Work ?  ?Post Acute Care Choice: IP Rehab ?Living arrangements for the past 2 months: [place] ?                ?  ?  ?  ?  ?  ?  ?  ?  ?  ?  ? ?Prior Living Arrangements/Services ?Living arrangements for the past 2 months: [place] ?Lives with:: Spouse ?  ?       ?Need for Family Participation in Patient Care: Yes (Comment) ?Care giver support system in place?: Yes (comment) ?Current home services: Other (comment) (none) ?  ? ?Activities of Daily Living ?Home Assistive Devices/Equipment: Eyeglasses, Oxygen ?ADL Screening (condition at time of admission) ?Patient's cognitive ability adequate to safely complete daily activities?: Yes ?Is the patient deaf or have difficulty hearing?: Yes ?Does the patient have difficulty seeing, even when wearing glasses/contacts?: No ?Does the patient have difficulty concentrating, remembering, or making decisions?: Yes ?Patient able to express need for assistance with ADLs?: Yes ?Does the patient have difficulty dressing or bathing?:  No ?Independently performs ADLs?: No ?Communication: Independent ?Dressing (OT): Appropriate for developmental age ?Grooming: Needs assistance ?Feeding: Independent ?Bathing: Needs assistance ?Toileting: Independent ?In/Out Bed: Needs assistance ?Walks in Home: Needs assistance ?Does the patient have difficulty walking or climbing stairs?: Yes ?Weakness of Legs: Both ?Weakness of Arms/Hands: None ? ?Permission Sought/Granted ?  ?  ?   ?   ?   ?   ? ?Emotional Assessment ?  ?  ?  ?Orientation: : Oriented to Self, Oriented to Place, Oriented to  Time, Oriented to Situation ?Alcohol / Substance Use: Not Applicable ?Psych Involvement: No (comment) ? ?Admission diagnosis:  Pleural effusion on right [J90] ?Acute on chronic diastolic congestive heart failure ([place]) [I50.33] ?Acute on chronic respiratory failure with hypoxia (HCC) [J96.21] ?Pleural effusion [J90] ?Patient Active Problem List  ? Diagnosis Date Noted  ? Acute metabolic encephalopathy 46/50/3546  ? Acute on chronic respiratory failure with hypoxia ([place]) 07/22/2021  ? Recurrent pleural effusion on right 06/16/2021  ? Strongyloidiasis 06/16/2021  ? Abnormal findings on diagnostic imaging of lung 04/30/2020  ? Chronic respiratory failure with hypoxia ([place]) 04/17/2020  ? Pleural effusion on right 01/17/2020  ? HTN (hypertension) 01/17/2020  ? Acute exacerbation of CHF (congestive heart failure) ([place]) 12/23/2019  ? Hypocalcemia 12/23/2019  ? Anemia of chronic disease 12/23/2019  ? Vertigo 12/23/2019  ? Acute respiratory failure with hypoxia ([place]) 12/23/2019  ? CAP (community acquired pneumonia) 10/28/2019  ?  Hypertensive cardiomyopathy, with heart failure ([place]) 06/15/2018  ? Chronic diastolic CHF (congestive heart failure) ([place]) 05/28/2018  ? DM2 (diabetes mellitus, type 2) ([place]) 05/28/2018  ? Stage 5 chronic kidney disease ([place]) 05/28/2018  ? CHF (congestive heart failure) ([place]) 05/28/2018  ? ?PCP:  Nolene Ebbs, MD ?Pharmacy:   ?[facility] ([facility] [place] [address] [address] ?Phone: [phone] Fax: [phone] ? ? ? ? ?Social Determinants of Health (SDOH) Interventions ?  ? ?Readmission Risk Interventions ? ?  04/20/2020  ?  7:52 PM  ?Readmission Risk Prevention Plan  ?Transportation Screening Complete  ?Medication Review Press photographer) Complete  ?PCP or Specialist appointment within 3-5 days of discharge Complete  ?[place] or Home Care Consult Complete  ?SW Recovery Care/Counseling Consult Complete  ?Palliative Care Screening Not Applicable  ?[place] Not Applicable  ? ? ? ?

## 2021-08-05 NOTE — Progress Notes (Signed)
Occupational Therapy Treatment ?Patient Details ?Name: Melvin Mcclure ?MRN: [id number] ?DOB: [birth year] ?Today's Date: 08/05/2021 ? ? ?History of present illness Pt is a 71 y.o. male admitted 07/22/21 with worsening SOB; workup for acute on chronic respiratory failure with recurrent pleural effusion initially requiring bipap. S/p thoracentesis 3/21. S/p RLL bronchoscopy, bronchoalveolar lavage, transbronchial lung biopsy 3/22. Course complicated by AKI on CKD; iHD initiated 3/23. Pt also with acute methabolic encephalopathy ; question ICU delirium vs uremia. PMH includes CKD 5 (undergoing transplant evaluation), HFpEF, DM2. ?  ?OT comments ? Pt continues to be limited by impaired cognition and balance. Pt demonstrating limited attention, internal  distractibility, decreased memory and sequencing. He is unaware of his deficits. Pt attempts OOB without assistance or use of RW. He needs moderate verbal cues to sequence donning slippers, to gather items necessary for grooming at sink and for completing oral care. Pt continues to be appropriate for intensive rehab in [facility] prior to return home with his supportive wife.   ? ?Recommendations for follow up therapy are one component of a multi-disciplinary discharge planning process, led by the attending physician.  Recommendations may be updated based on patient status, additional functional criteria and insurance authorization. ?   ?Follow Up Recommendations ? Acute inpatient rehab (3hours/day)  ?  ?Assistance Recommended at Discharge Frequent or constant Supervision/Assistance  ?Patient can return home with the following ? A little help with walking and/or transfers;A little help with bathing/dressing/bathroom;Assistance with cooking/housework;Direct supervision/assist for medications management;Direct supervision/assist for financial management;Assist for transportation;Help with stairs or ramp for entrance ?  ?Equipment Recommendations ? Tub/shower seat  ?  ?Recommendations  for Other Services   ? ?  ?Precautions / Restrictions Precautions ?Precautions: Fall ?Restrictions ?Weight Bearing Restrictions: No  ? ? ?  ? ?Mobility Bed Mobility ?  ?  ?  ?  ?  ?  ?  ?General bed mobility comments: sitting EOB upon arrival, attempting OOB without assist ?  ? ?Transfers ?Overall transfer level: Needs assistance ?Equipment used: Rolling walker (2 wheels) ?Transfers: Sit to/from Stand ?Sit to Stand: Min assist (moderate cues for technique and safety) ?  ?  ?  ?  ?  ?General transfer comment: multimodal cues for technique, min assist to rise and steady, mod cues for hand placement as pt forgets quickly after cues are given therefore needs repetition. ?  ?  ?Balance Overall balance assessment: Needs assistance ?  ?Sitting balance-Leahy Scale: Good ?Sitting balance - Comments: no LOB at EOB with LB dressing ?  ?  ?Standing balance-Leahy Scale: Fair ?Standing balance comment: fair static balance, poor dynamic needing RW ?  ?  ?  ?  ?  ?  ?  ?  ?  ?  ?  ?   ? ?ADL either performed or assessed with clinical judgement  ? ?ADL Overall ADL's : Needs assistance/impaired ?  ?  ?Grooming: Oral care;Wash/dry hands;Brushing hair;Standing;Minimal assistance ?Grooming Details (indicate cue type and reason): pt with poor sequencing, repeating activities already completed, decreased thoroughness with oral care ?  ?  ?  ?  ?  ?  ?Lower Body Dressing: Minimal assistance;Sitting/lateral leans ?Lower Body Dressing Details (indicate cue type and reason): cues to don slippers using figure 4 method, attempting to wear without heels in shoes ?  ?  ?  ?  ?  ?  ?Functional mobility during ADLs: Minimal assistance;Rolling walker (2 wheels) ?General ADL Comments: Pt unable to recall next planned activity less than 5 minutes after instruction. ?  ? ?  Extremity/Trunk Assessment   ?  ?  ?  ?  ?  ? ?Vision   ?  ?  ?Perception   ?  ?Praxis   ?  ? ?Cognition Arousal/Alertness: Awake/alert ?Behavior During Therapy: Flat affect,  Impulsive ?Overall Cognitive Status: Impaired/Different from baseline ?Area of Impairment: Orientation, Attention, Memory, Following commands, Safety/judgement, Awareness, Problem solving ?  ?  ?  ?  ?  ?  ?  ?  ?Orientation Level: Disoriented to, Time, Situation ?Current Attention Level: Focused ?Memory: Decreased short-term memory ?Following Commands: Follows one step commands inconsistently, Follows one step commands with increased time ?Safety/Judgement: Decreased awareness of safety, Decreased awareness of deficits ?Awareness: Intellectual ?Problem Solving: Slow processing, Decreased initiation, Difficulty sequencing, Requires verbal cues, Requires tactile cues ?General Comments: pt internally distracted, unable to recall items to gather prior to grooming ?  ?  ?   ?Exercises   ? ?  ?Shoulder Instructions   ? ? ?  ?General Comments Pt able to recall ambulation and brushing hair at end of session, no further activities.  ? ? ?Pertinent Vitals/ Pain       Pain Assessment ?Pain Assessment: No/denies pain ? ?Home Living   ?  ?  ?  ?  ?  ?  ?  ?  ?  ?  ?  ?  ?  ?  ?  ?  ?  ?  ? ?  ?Prior Functioning/Environment    ?  ?  ?  ?   ? ?Frequency ? Min 2X/week  ? ? ? ? ?  ?Progress Toward Goals ? ?OT Goals(current goals can now be found in the care plan section) ? Progress towards OT goals: Progressing toward goals ? ?Acute Rehab OT Goals ?OT Goal Formulation: With patient/family ?Time For Goal Achievement: 08/12/21 ?Potential to Achieve Goals: Good  ?Plan Discharge plan remains appropriate   ? ?Co-evaluation ? ? ? PT/OT/SLP Co-Evaluation/Treatment: Yes ?Reason for Co-Treatment: For patient/therapist safety ?PT goals addressed during session: Mobility/safety with mobility;Balance;Proper use of DME ?OT goals addressed during session: ADL's and self-care ?  ? ?  ?AM-PAC OT "6 Clicks" Daily Activity     ?Outcome Measure ? ? Help from another person eating meals?: None ?Help from another person taking care of personal  grooming?: A Little ?Help from another person toileting, which includes using toliet, bedpan, or urinal?: A Lot ?Help from another person bathing (including washing, rinsing, drying)?: A Lot ?Help from another person to put on and taking off regular upper body clothing?: A Little ?Help from another person to put on and taking off regular lower body clothing?: A Little ?6 Click Score: 17 ? ?  ?End of Session Equipment Utilized During Treatment: Gait belt;Rolling walker (2 wheels) ? ?OT Visit Diagnosis: Unsteadiness on feet (R26.81);Other abnormalities of gait and mobility (R26.89);Muscle weakness (generalized) (M62.81);Other symptoms and signs involving cognitive function ?  ?Activity Tolerance Patient tolerated treatment well ?  ?Patient Left in chair;with call bell/phone within reach;with chair alarm set;with family/visitor present;with nursing/sitter in room ?  ?Nurse Communication   ?  ? ?   ? ?Time: 1107-1130 ?OT Time Calculation (min): 23 min ? ?Charges: OT General Charges ?$OT Visit: 1 Visit ?OT Treatments ?$Self Care/Home Management : 8-22 mins ? ?Nestor Lewandowsky, OTR/L ?Acute Rehabilitation Services ?Pager: [phone] ?Office: [phone]  ? ?Malka So ?08/05/2021, 12:07 PM ?

## 2021-08-05 NOTE — Care Management Important Message (Signed)
Important Message ? ?Patient Details  ?Name: Melvin Mcclure ?MRN: [id number] ?Date of Birth: 04/23/1951 ? ? ?Medicare Important Message Given:  Yes ? ? ? ? ?Levada Dy  Lang Zingg-Martin ?08/05/2021, 3:37 PM ?

## 2021-08-06 DIAGNOSIS — G9341 Metabolic encephalopathy: Secondary | ICD-10-CM | POA: Diagnosis not present

## 2021-08-06 DIAGNOSIS — E1100 Type 2 diabetes mellitus with hyperosmolarity without nonketotic hyperglycemic-hyperosmolar coma (NKHHC): Secondary | ICD-10-CM | POA: Diagnosis not present

## 2021-08-06 LAB — GLUCOSE, CAPILLARY
Glucose-Capillary: 174 mg/dL — ABNORMAL HIGH (ref 70–99)
Glucose-Capillary: 173 mg/dL — ABNORMAL HIGH (ref 70–99)
Glucose-Capillary: 185 mg/dL — ABNORMAL HIGH (ref 70–99)
Glucose-Capillary: 194 mg/dL — ABNORMAL HIGH (ref 70–99)
Glucose-Capillary: 164 mg/dL — ABNORMAL HIGH (ref 70–99)

## 2021-08-06 MED ORDER — KIDNEY FAILURE BOOK: Freq: Once | Status: DC

## 2021-08-06 NOTE — Progress Notes (Signed)
Inpatient Rehab Admissions Coordinator:  ? ?I continue to await a decision on CIR appeal. I will update the chart once I hear from [facility]. Pt. And wife updated.  ? ?Clemens Catholic, MS, CCC-SLP ?Rehab Admissions Coordinator  ?[phone] (celll) ?[phone] (office) ? ?

## 2021-08-06 NOTE — Progress Notes (Signed)
Subjective:  Seen in room.  Doing OK- dialysis went well yesterday.  Napping on couch in room.   ? ? ?Objective ?Vital signs in last 24 hours: ?Vitals:  ? 08/05/21 2035 08/06/21 0500 08/06/21 0524 08/06/21 0805  ?BP: 136/71  (!) 109/94 (!) 125/52  ?Pulse: 77  85 78  ?Resp: '20  18 17  '$ ?Temp: 97.7 ?F (36.5 ?C)  98.6 ?F (37 ?C) 98.4 ?F (36.9 ?C)  ?TempSrc: Oral  Oral Oral  ?SpO2: 97%  95% 94%  ?Weight:  88.2 kg    ?Height:      ? ?Weight change:  ? ?Intake/Output Summary (Last 24 hours) at 08/06/2021 1421 ?Last data filed at 08/05/2021 1709 ?Gross per 24 hour  ?Intake --  ?Output 1500 ml  ?Net -1500 ml  ? ? ? ?Assessment/ Plan: ?Pt is a 71 y.o. yo male with advanced CKD at baseline who was admitted on 07/22/2021 with  respiratory failure--  dialysis has been initiated this hospitalization for progression to ESRD ?Assessment/Plan: ?1. Renal-  dialysis has been initiated this hospitalization for ESRD-  have been able to use his AVF- first treatment 3/23-   we now have him on a MWF schedule but OP schedule is going to be T[facility] at [facility].  Will try to do Mon/Thurs/Sat this week ?2. HTN/volume-  volume status much better, only on hydralazine 25 TID for now ?3. MS-  has only been very slow to improve but is improving--> still with some things that don't quite make sense ?4. Anemia-  hgb has been great-  has not required any treatment  ?5. Secondary hyperparathyroidism-  on phoslo-  last phos 5.7-  also on calcitriol daily oral -  Pth pending ?6. Dispo- deconditioned-  tried for rehab-  did not get approved, appeal made.  I think he would be a great candidate if insurance would approve ? ? ?Daune Divirgilio  ? ? ?Labs: ?Basic Metabolic Panel: ?Recent Labs  ?Lab 08/01/21 ?5329 08/02/21 ?9242 08/05/21 ?0631  ?NA 133* 130* 131*  ?K 4.7 4.2 3.8  ?CL 90* 89* 91*  ?CO2 '25 23 26  '$ ?GLUCOSE 151* 97 225*  ?BUN 63* 80* 82*  ?CREATININE 7.14* 7.88* 8.16*  ?CALCIUM 8.9 9.2 8.8*  ?PHOS 5.8* 5.7* 3.8  ? ?Liver Function Tests: ?Recent Labs  ?Lab  08/01/21 ?6834 08/02/21 ?1962 08/05/21 ?0631  ?ALBUMIN 3.5 3.4* 3.1*  ? ?No results for input(s): LIPASE, AMYLASE in the last 168 hours. ?No results for input(s): AMMONIA in the last 168 hours. ?CBC: ?Recent Labs  ?Lab 07/31/21 ?0150 08/02/21 ?0742 08/05/21 ?0631  ?WBC 13.3* 13.1* 11.4*  ?HGB 13.9 15.7 13.0  ?HCT 42.4 47.1 39.9  ?MCV 84.0 82.9 84.7  ?PLT 274 307 266  ? ?Cardiac Enzymes: ?No results for input(s): CKTOTAL, CKMB, CKMBINDEX, TROPONINI in the last 168 hours. ?CBG: ?Recent Labs  ?Lab 08/05/21 ?1730 08/05/21 ?2203 08/05/21 ?2227 08/06/21 ?0802 08/06/21 ?1223  ?GLUCAP 118* 197* 182* 174* 173*  ? ? ?Iron Studies: No results for input(s): IRON, TIBC, TRANSFERRIN, FERRITIN in the last 72 hours. ?Studies/Results: ?No results found. ?Medications: ?Infusions: ? ? ?Scheduled Medications: ? aspirin  81 mg Oral Daily  ? calcitRIOL  0.25 mcg Oral QODAY  ? calcium acetate  1,334 mg Oral TID WC  ? calcium carbonate  2 tablet Oral QHS  ? Chlorhexidine Gluconate Cloth  6 each Topical Q0600  ? docusate sodium  100 mg Oral BID  ? feeding supplement (NEPRO CARB STEADY)  237 mL Oral BID BM  ?  heparin  5,000 Units Subcutaneous Q8H  ? hydrALAZINE  25 mg Oral TID  ? insulin aspart  0-6 Units Subcutaneous TID WC  ? insulin glargine-yfgn  14 Units Subcutaneous Daily  ? isosorbide mononitrate  60 mg Oral QPM  ? ketotifen  1 drop Both Eyes BID  ? kidney failure book   Does not apply Once  ? meclizine  25 mg Oral BID  ? mouth rinse  15 mL Mouth Rinse BID  ? polyethylene glycol  17 g Oral Daily  ? senna-docusate  1 tablet Oral QHS  ? sodium chloride flush  3 mL Intravenous Q12H  ? ? have reviewed scheduled and prn medications. ? ?Physical Exam: ?General:  NAD-  in bedside chair ?Heart: RRR ?Lungs: mostly clear ?Abdomen: soft, non tender ?Extremities: no edema ?Dialysis Access: left upper AVF-  good thrill and bruit   ? ? ?08/06/2021,2:21 PM ? LOS: 14 days  ? ?  ? ? ? ? ?

## 2021-08-06 NOTE — Progress Notes (Signed)
? ?[facility] ?PROGRESS NOTE ? ? ?Edmar Blankenburg ZOX:[id number] DOB: [birth year] DOA: 07/22/2021  14 ?DOS: the patient was seen and examined on 08/06/2021 ? ?PCP: Nolene Ebbs, MD ? ?Brief History and Hospital Course:  ?71 y.o. 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  Apparently uses 2 L of oxygen by nasal cannula at home as needed.  Patient with history of recurrent pleural effusion.  Seen by pulmonology.  Had high oxygen requirements.  Was treated transferred to the ICU.  Underwent diagnostic and therapeutic thoracocentesis with removal of 1500 MLS of fluid on 07/23/2021.  Patient then underwent flexible bronchoscopy with brushing and bronchoalveolar lavage and transbronchial lung biopsy on 07/24/2021 for nonresolving opacity with recurrent pleural effusion. Required BiPAP.  Then transitioned to high flow nasal cannula.  BiPAP recommended at nighttime by pulmonology which the patient has been refusing.  Subsequently transferred back to floor.  Mentation has been gradually improving.  Remains deconditioned. ? ? ?Consultants: Pulmonology.  Nephrology ? ?Procedures: Thoracentesis.  Bronchoscopy.  Hemodialysis ? ? ?Subjective: ?Patient denies any complaints this morning.  Specifically no shortness of breath or chest pain.  No nausea or vomiting.   ? ? ?Assessment/Plan: ? ? ?* Acute on chronic respiratory failure with hypoxia ([place]) ?Recurrent pleural effusion ?Chronic bibasilar infiltrate ? ?Underwent thoracentesis.  Seen by pulmonology.   ?Pleural effusion likely due to combination of fluid overload as well as infection. ?Status post thoracocentesis on  07/23/2021 with removal of 1500 mL of pleural fluid.  Pleural fluid culture negative in 24 hours.  Additionally, patient underwent  flexible bronchoscopy with brushing and bronchoalveolar lavage and transbronchial lung biopsy on 07/24/2021.  ?BAL positive for Prevotella.  Completed course of Augmentin. Follow fungal cultures (still pending as of 4/4).   ?Patient remains afebrile.  WBC better but minimally elevated.  ?Respiratory status has improved.  Now saturating normal on room air.  ? ?Acute metabolic encephalopathy ?Likely ICU delirium component to confusion/lethargy vs uremia ?Now on hemodialysis.   ?Mentation is gradually improving.  No focal neurological deficits.   ? ?Stage 5 chronic kidney disease ([place]) ?Progression to End-stage renal disease ?Hyponatremia ? ?Progression of kidney disease during this admission.  Nephrology was consulted.  Initially given high-dose furosemide without improvement.  Subsequently started on hemodialysis. ?Currently being dialyzed on a Monday Wednesday Friday schedule.  He is being transitioned to a Tuesday Thursday Saturday schedule which is what is being planned in the outpatient setting.   ? ?Chronic diastolic CHF (congestive heart failure) ([place]) ?Volume being managed with hemodialysis.   ?2D echocardiogram done 07/23/2021 shows preserved LV function with LV ejection fraction of 55 to 60% with no regional wall motion abnormality and grade 2 diastolic dysfunction. ? ?DM2 (diabetes mellitus, type 2) ([place]) ?Noted to be on SSI and glargine.   HbA1c 7.0.   ?Glargine dose had to be adjusted due to episodes of low glucose levels followed by hyperglycemia.  Stable for the most part in the last 24 hours.  Will not make any further adjustments today. ? ?HTN (hypertension) ?Drop in blood pressure noted while on dialysis.  Dose of hydralazine was decreased by nephrology.  Also remains on Imdur.   ?He was taken off of amlodipine and carvedilol previously.   ?Blood pressure stable the last 24 hours ? ?Strongyloidiasis ?-Should have been effectively treated with Ivermectin x 2 rounds ?-F/u  with ID and/or transplant as  needed ? ? ? ?DVT Prophylaxis: Subcutaneous heparin ?Code Status: Full code ?Family Communication: Discussed with patient.  Discussed with his wife yesterday. ?Disposition Plan: Inpatient rehabilitation was recommended by physical therapy.  Declined by [facility].  Consulted TOC to initiate SNF placement.  Family appealing CIR denial. ? ? ?Status is: Inpatient ?Remains inpatient appropriate because: Progression to end-stage renal disease, acute respiratory failure ? ? ? ? ?Medications: Scheduled: ? aspirin  81 mg Oral Daily  ? calcitRIOL  0.25 mcg Oral QODAY  ? calcium acetate  1,334 mg Oral TID WC  ? calcium carbonate  2 tablet Oral QHS  ? Chlorhexidine Gluconate Cloth  6 each Topical Q0600  ? docusate sodium  100 mg Oral BID  ? feeding supplement (NEPRO CARB STEADY)  237 mL Oral BID BM  ? heparin  5,000 Units Subcutaneous Q8H  ? hydrALAZINE  25 mg Oral TID  ? insulin aspart  0-6 Units Subcutaneous TID WC  ? insulin glargine-yfgn  14 Units Subcutaneous Daily  ? isosorbide mononitrate  60 mg Oral QPM  ? ketotifen  1 drop Both Eyes BID  ? meclizine  25 mg Oral BID  ? mouth rinse  15 mL Mouth Rinse BID  ? polyethylene glycol  17 g Oral Daily  ? senna-docusate  1 tablet Oral QHS  ? sodium chloride flush  3 mL Intravenous Q12H  ? ?Continuous: ? ? ?KZS:[id number] **OR** acetaminophen, albuterol, calcium carbonate (dosed in mg elemental calcium), docusate sodium, haloperidol lactate, hydrALAZINE, HYDROcodone-acetaminophen, ondansetron **OR** ondansetron (ZOFRAN) IV, phenol, senna-docusate, sorbitol, zolpidem ? ?Antibiotics: ?Anti-infectives (From admission, onward)  ? ? Start     Dose/Rate Route Frequency Ordered Stop  ? 07/29/21 1200  amoxicillin-clavulanate (AUGMENTIN) 500-125 MG per tablet 500 mg       ? 1 tablet Oral Daily 07/29/21 0914 08/04/21 2021  ? ?  ? ? ?Objective: ? ?Vital Signs ? ?Vitals:  ? 08/05/21 2035 08/06/21 0500 08/06/21 0524 08/06/21 0805  ?BP: 136/71  (!) 109/94 (!) 125/52  ?Pulse: 77  85  78  ?Resp: _0 ?Temp: 97.7 ?F (36.5 ?C)  98.6 ?F (37 ?C) 98.4 ?F (36.9 ?C)  ?TempSrc: Oral  Oral Oral  ?SpO2: 97%  95% 94%  ?Weight:  88.2 kg    ?Height:      ? ? ?Intake/Output Summary (Last 24 hours) at 08/06/2021 0939 ?Last data filed at 08/05/2021 1709 ?Gross per 24 hour  ?Intake --  ?Output 1500 ml  ?Net -1500 ml  ? ? ?Filed Weights  ? 08/05/21 1321 08/05/21 1709 08/06/21 0500  ?Weight: 88.2 kg 86.5 kg 88.2 kg  ? ?Awake alert.  In no distress. ?Lungs are clear to auscultation bilaterally. ?S1-S2 is normal regular.  No S3-S4.  No rubs murmurs or bruit. ?Abdomen soft.  Nontender nondistended. ?No focal neurological deficits.  ? ? ?Lab Results: ? ?Data Reviewed: I have personally reviewed labs and imaging study reports ? ?CBC: ?Recent Labs  ?Lab 07/31/21 ?0150 08/02/21 ?0742 08/05/21 ?0631  ?WBC 13.3* 13.1* 11.4*  ?HGB 13.9 15.7 13.0  ?HCT 42.4 47.1 39.9  ?MCV 84.0 82.9 84.7  ?PLT 274 307 266  ? ? ? ?Basic Metabolic Panel: ?Recent Labs  ?Lab 07/31/21 ?0150 08/01/21 ?0254 08/02/21 ?2706 08/05/21 ?0631  ?NA 134* 133* 130* 131*  ?K 4.0 4.7 4.2 3.8  ?CL 94* 90* 89* 91*  ?CO2 _1 ?GLUCOSE 112* 151* 97 225*  ?BUN 66* 63* 80*  82*  ?CREATININE 7.24* 7.14* 7.88* 8.16*  ?CALCIUM 8.5* 8.9 9.2 8.8*  ?PHOS  --  5.8* 5.7* 3.8  ? ? ? ?GFR: ?Estimated Creatinine Clearance: 9.3 mL/min (A) (by C-G formula based on SCr of 8.16 mg/dL (H)). ? ?Liver Function Tests: ?Recent Labs  ?Lab 08/01/21 ?1194 08/02/21 ?1740 08/05/21 ?0631  ?ALBUMIN 3.5 3.4* 3.1*  ? ? ? ?CBG: ?Recent Labs  ?Lab 08/05/21 ?1715 08/05/21 ?1730 08/05/21 ?2203 08/05/21 ?2227 08/06/21 ?0802  ?GLUCAP 122* 118* 197* 182* 174*  ? ? ? ? ? ?No results found for this or any previous visit (from the past 240 hour(s)). ? ?  ? ? ?Radiology Studies: ?No results found. ? ? ? ? LOS: 14 days  ? ?Bonnielee Haff ? ?[facility] ?Pager on [email or web address] ? ?08/06/2021, 9:39 AM ? ? ?

## 2021-08-06 NOTE — Progress Notes (Signed)
Rounded on patient today in correlation to transition to outpatient HD. Kidney Failure book given. Educated patient and wife at the bedside regarding AV fistula care, assessment of thrill daily and proper medication administration on HD days.  Patient was also educated on the importance of adhering to scheduled dialysis treatments, the effects of fluid overload, hyperkalemia and hyperphosphatemia. Patient and wife capable of re-verbalizing via teach-back method. Also educated on services available through the interdisciplinary team in the outpatient hemodialysis setting. Wife is a Marine scientist and she was very appreciative of the information that was given to them. Patient and wife with no further questions at this time. Handouts and contact information were provided for any further assistance. Will follow as appropriate. ?

## 2021-08-06 NOTE — Plan of Care (Signed)
  Problem: Nutrition: Goal: Adequate nutrition will be maintained Outcome: Progressing   Problem: Coping: Goal: Level of anxiety will decrease Outcome: Progressing   Problem: Elimination: Goal: Will not experience complications related to bowel motility Outcome: Progressing   Problem: Safety: Goal: Ability to remain free from injury will improve Outcome: Progressing   

## 2021-08-06 NOTE — Plan of Care (Signed)

## 2021-08-06 NOTE — TOC Progression Note (Signed)
Transition of Care (TOC) - Progression Note  ? ? ?Patient Details  ?Name: Kamaron Deskins ?MRN: [id number] ?Date of Birth: July 03, 1950 ? ?Transition of Care (TOC) CM/SW Contact  ?Joanne Chars, LCSW ?Phone Number: ?08/06/2021, 3:56 PM ? ?Clinical Narrative:   Pt appeal for CIR still pending.  CSW presented SNF bed offers to pt and wife and they will review these while waiting on appeal.   ? ? ? ?Expected Discharge Plan: [place] ?Barriers to Discharge: Other (must enter comment), SNF Pending bed offer (auth denied by insurance for CIR, under appeal) ? ?Expected Discharge Plan and Services ?Expected Discharge Plan: [place] ?In-house Referral: Clinical Social Work ?  ?Post Acute Care Choice: IP Rehab ?Living arrangements for the past 2 months: [place] ?                ?  ?  ?  ?  ?  ?  ?  ?  ?  ?  ? ? ?Social Determinants of Health (SDOH) Interventions ?  ? ?Readmission Risk Interventions ? ?  04/20/2020  ?  7:52 PM  ?Readmission Risk Prevention Plan  ?Transportation Screening Complete  ?Medication Review Press photographer) Complete  ?PCP or Specialist appointment within 3-5 days of discharge Complete  ?[place] or Home Care Consult Complete  ?SW Recovery Care/Counseling Consult Complete  ?Palliative Care Screening Not Applicable  ?May Not Applicable  ? ? ?

## 2021-08-06 NOTE — Progress Notes (Signed)
Physical Therapy Treatment ?Patient Details ?Name: Melvin Mcclure ?MRN: [id number] ?DOB: 08/04/1950 ?Today's Date: 08/06/2021 ? ? ?History of Present Illness 71 y.o. male admitted 07/22/21 with worsening SOB; workup for acute on chronic respiratory failure with recurrent pleural effusion initially requiring bipap. S/p thoracentesis 3/21. S/p RLL bronchoscopy, bronchoalveolar lavage, transbronchial lung biopsy 3/22. Course complicated by AKI on CKD; iHD initiated 3/23. Pt also with acute methabolic encephalopathy ; question ICU delirium vs uremia. PMH includes CKD 5 (undergoing transplant evaluation), HFpEF, DM2. ? ?  ?PT Comments  ? ? Pt was assisted to get up to walk and sequence stairs, with slow pace and cues for postural correction.  Required some motivation initially, but is interested in getting home as soon as he reasonably can. Pt is going to have his wife there to assist him, and will be important given his general safety awareness.  Hopefully this can be addressed in CIR.  Follow for goals of acute PT.   ?Recommendations for follow up therapy are one component of a multi-disciplinary discharge planning process, led by the attending physician.  Recommendations may be updated based on patient status, additional functional criteria and insurance authorization. ? ?Follow Up Recommendations ? Acute inpatient rehab (3hours/day) ?  ?  ?Assistance Recommended at Discharge Frequent or constant Supervision/Assistance  ?Patient can return home with the following A little help with walking and/or transfers;A little help with bathing/dressing/bathroom;Assistance with cooking/housework;Direct supervision/assist for medications management;Direct supervision/assist for financial management;Help with stairs or ramp for entrance ?  ?Equipment Recommendations ? Rolling walker (2 wheels);BSC/3in1;Wheelchair (measurements PT);Wheelchair cushion (measurements PT)  ?  ?Recommendations for Other Services Rehab consult ? ? ?   ?Precautions / Restrictions Precautions ?Precautions: Fall ?Precaution Comments: ck sats, monitor incontinence ?Restrictions ?Weight Bearing Restrictions: No  ?  ? ?Mobility ? Bed Mobility ?  ?  ?  ?  ?  ?  ?  ?General bed mobility comments: up inchair ?  ? ?Transfers ?Overall transfer level: Needs assistance ?Equipment used: Rolling walker (2 wheels) ?Transfers: Sit to/from Stand ?Sit to Stand: Min guard, Min assist ?  ?  ?  ?  ?  ?General transfer comment: cued safety and sequence ?  ? ?Ambulation/Gait ?Ambulation/Gait assistance: Min guard, Min assist ?Gait Distance (Feet): 150 Feet ?Assistive device: Rolling walker (2 wheels) ?Gait Pattern/deviations: Step-through pattern, Decreased stride length ?Gait velocity: Decreased ?Gait velocity interpretation: <1.31 ft/sec, indicative of household ambulator ?  ?General Gait Details: pt is not aware of his need for help, requiring repeated cues and reminders for safety and sequence on RW. Additional cues for sequence on stairs ? ? ?Stairs ?  ?  ?  ?  ?  ? ? ?Wheelchair Mobility ?  ? ?Modified Rankin (Stroke Patients Only) ?  ? ? ?  ?Balance Overall balance assessment: Needs assistance ?Sitting-balance support: Feet supported ?Sitting balance-Leahy Scale: Good ?  ?  ?Standing balance support: Bilateral upper extremity supported, During functional activity ?Standing balance-Leahy Scale: Fair ?Standing balance comment: less than fair dynamically ?  ?  ?  ?  ?  ?  ?  ?  ?  ?  ?  ?  ? ?  ?Cognition Arousal/Alertness: Awake/alert ?Behavior During Therapy: Flat affect, Impulsive ?Overall Cognitive Status: Impaired/Different from baseline ?Area of Impairment: Orientation, Memory, Following commands, Safety/judgement, Awareness, Problem solving ?  ?  ?  ?  ?  ?  ?  ?  ?Orientation Level: Time, Situation ?Current Attention Level: Selective ?Memory: Decreased short-term memory ?Following Commands: Follows one step commands  with increased time ?Safety/Judgement: Decreased awareness  of safety ?Awareness: Intellectual ?Problem Solving: Slow processing, Decreased initiation, Requires verbal cues ?General Comments: pt is working at timing of movement and control of his impulses ?  ?  ? ?  ?Exercises   ? ?  ?General Comments General comments (skin integrity, edema, etc.): pt is reluctant to hear the urging from his wife to do therapy as he is tired and feeling resistant to move initially but then finally agreed ?  ?  ? ?Pertinent Vitals/Pain Pain Assessment ?Pain Assessment: No/denies pain  ? ? ?Home Living   ?  ?  ?  ?  ?  ?  ?  ?  ?  ?   ?  ?Prior Function    ?  ?  ?   ? ?PT Goals (current goals can now be found in the care plan section) Acute Rehab PT Goals ?Patient Stated Goal: to go home ?Progress towards PT goals: Progressing toward goals ? ?  ?Frequency ? ? ? Min 4X/week ? ? ? ?  ?PT Plan Current plan remains appropriate  ? ? ?Co-evaluation   ?  ?  ?  ?  ? ?  ?AM-PAC PT "6 Clicks" Mobility   ?Outcome Measure ? Help needed turning from your back to your side while in a flat bed without using bedrails?: A Little ?Help needed moving from lying on your back to sitting on the side of a flat bed without using bedrails?: A Little ?Help needed moving to and from a bed to a chair (including a wheelchair)?: A Little ?Help needed standing up from a chair using your arms (e.g., wheelchair or bedside chair)?: A Little ?Help needed to walk in hospital room?: A Lot ?Help needed climbing 3-5 steps with a railing? : A Lot ?6 Click Score: 16 ? ?  ?End of Session Equipment Utilized During Treatment: Gait belt ?Activity Tolerance: Patient tolerated treatment well;Patient limited by fatigue ?Patient left: in chair;with call bell/phone within reach;with chair alarm set;with family/visitor present ?Nurse Communication: Mobility status ?PT Visit Diagnosis: Unsteadiness on feet (R26.81);Muscle weakness (generalized) (M62.81) ?  ? ? ?Time: 8937-3428 ?PT Time Calculation (min) (ACUTE ONLY): 20 min ? ?Charges:  $Gait  Training: 8-22 mins     ?Ramond Dial ?08/06/2021, 3:43 PM ? ?Mee Hives, PT PhD ?Acute Rehab Dept. Number: [facility] [phone] and [place] [phone] ? ? ?

## 2021-08-07 DIAGNOSIS — J9621 Acute and chronic respiratory failure with hypoxia: Secondary | ICD-10-CM | POA: Diagnosis not present

## 2021-08-07 LAB — GLUCOSE, CAPILLARY
Glucose-Capillary: 193 mg/dL — ABNORMAL HIGH (ref 70–99)
Glucose-Capillary: 137 mg/dL — ABNORMAL HIGH (ref 70–99)
Glucose-Capillary: 180 mg/dL — ABNORMAL HIGH (ref 70–99)
Glucose-Capillary: 263 mg/dL — ABNORMAL HIGH (ref 70–99)

## 2021-08-07 MED ORDER — PENTAFLUOROPROP-TETRAFLUOROETH EX AERO: 1.0000 "application " | INHALATION_SPRAY | CUTANEOUS | Status: DC | PRN

## 2021-08-07 MED ORDER — SODIUM CHLORIDE 0.9 % IV SOLN: 100.0000 mL | INTRAVENOUS | Status: DC | PRN

## 2021-08-07 MED ORDER — CHLORHEXIDINE GLUCONATE CLOTH 2 % EX PADS
6.0000 | MEDICATED_PAD | Freq: Every day | CUTANEOUS | Status: DC
  Administered 2021-08-08 – 2021-08-10 (×3): 6 via TOPICAL

## 2021-08-07 MED ORDER — LIDOCAINE HCL (PF) 1 % IJ SOLN
5.0000 mL | INTRAMUSCULAR | Status: DC | PRN
  Filled 2021-08-07: qty 5

## 2021-08-07 MED ORDER — HEPARIN SODIUM (PORCINE) 1000 UNIT/ML DIALYSIS
1000.0000 [IU] | INTRAMUSCULAR | Status: DC | PRN
  Filled 2021-08-07: qty 1

## 2021-08-07 MED ORDER — LIDOCAINE-PRILOCAINE 2.5-2.5 % EX CREA: 1.0000 "application " | TOPICAL_CREAM | CUTANEOUS | Status: DC | PRN

## 2021-08-07 MED ORDER — RENA-VITE PO TABS
1.0000 | ORAL_TABLET | Freq: Every day | ORAL | Status: DC
  Administered 2021-08-07 – 2021-08-11 (×5): 1 via ORAL
  Filled 2021-08-07 (×5): qty 1

## 2021-08-07 MED ORDER — ENSURE ENLIVE PO LIQD
237.0000 mL | Freq: Two times a day (BID) | ORAL | Status: DC
  Administered 2021-08-07 – 2021-08-10 (×5): 237 mL via ORAL

## 2021-08-07 NOTE — Progress Notes (Signed)
?PROGRESS NOTE ? ?Melvin Mcclure  ?DOB: March 19, 1951  ?PCP: Nolene Ebbs, MD ?ONG:[id number]  ?DOA: 07/22/2021 ? LOS: 15 days  ?Hospital Day: 17 ? ?Brief narrative: ?Melvin Mcclure is a 71 y.o. male with PMH significant for DM 2, HTN, HLD, chronic diastolic CHF, history of recurrent pleural effusion on 2 L oxygen at home, CKD 5, chronic anemia, iron deficiency anemia, peripheral neuropathy, chronic foot ulcer, arthritis, anxiety.  Patient was undergoing evaluation for renal transplant.  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  ?Patient presented to ED on 3/20 for shortness of breath.   ?In the ED, O2 sat 75% room air required 4 L oxygen. ?Imaging showed right pleural effusion, labs showed worsening renal function ?Admitted to hospitalist service ?Pulmonology and nephrology were consulted. ? ?3/21, underwent thoracentesis with removal of 1500 mill of fluid ?3/22, underwent flexible bronchoscopy with brushing and bronchial rule out level as an transbronchial lung biopsy for nonresolving obesity recurrent pleural effusion ?His oxygen requirement worsened and required BiPAP ?3/23, he was transferred to ICU on BiPAP.  He subsequently improved to high flow nasal cannula.  Nighttime BiPAP was recommended but patient refused. ?3/23, patient was started on dialysis 3 days fistula. ?3/29, transferred back to [facility]  ? ?Subjective: ?Patient was seen and examined this morning.  Elderly Caucasian male.  Lying down in bed.  Not in distress.  No new symptoms.  Waiting for insurance appeal for CIR. ?Chart reviewed.  Last 24 hours, hemodynamically stable, breathing on room air ?Blood sugar level consistently less than 200 ?Last set of labs from 4/3 with sodium 131, creatinine 8.16, WBC count 11.4 ? ?Principal Problem: ?  Acute on chronic respiratory failure with hypoxia ([place]) ?Active Problems: ?  Recurrent pleural effusion on right ?  Acute metabolic encephalopathy ?  Stage 5 chronic  kidney disease ([place]) ?  Chronic diastolic CHF (congestive heart failure) ([place]) ?  DM2 (diabetes mellitus, type 2) ([place]) ?  HTN (hypertension) ?  Strongyloidiasis ?  ? ? ?Assessment and Plan: ?Acute on chronic respiratory failure with hypoxia ([place]) ?Recurrent pleural effusion ?Chronic bibasilar infiltrate ?-Seen by pulmonary.  Underwent thoracentesis.  Underwent bronchoscopy and brushing ?-BAL positive for Prevotella.  Completed course of Augmentin. ?-Respiratory status stable.  Breathing on room air. ?-WBC count improving, only slightly elevated at 11.4 today. ?Recent Labs  ?Lab 08/02/21 ?0742 08/05/21 ?0631  ?WBC 13.1* 11.4*  ? ?Acute metabolic encephalopathy ?-Likely ICU delirium component to confusion/lethargy vs uremia ? ?New ESRD ?-Nephrology consult was obtained.  Initially tried on high-dose Lasix without improvement.  Patient was subsequently started on hemodialysis. ?Currently being dialyzed on a Monday Wednesday Friday schedule.  He is being transitioned to a Tuesday Thursday Saturday schedule which is what is being planned in the outpatient setting.   ? ?Chronic diastolic CHF ?Essential hypertension ?-Volume being managed with hemodialysis.   ?-2D echocardiogram done 07/23/2021 shows preserved LV function with LV ejection fraction of 55 to 60% with no regional wall motion abnormality and grade 2 diastolic dysfunction. ?-Blood pressure dropped during dialysis intermittently. ?-Currently on hydralazine 25 mg 3 times daily, Imdur 60 mg daily.  Off amlodipine and Coreg.  Torsemide, metolazone stopped ? ?Type 2 diabetes mellitus ?-A1c 7 on 07/22/2021 ?-Home meds includ Lantus 20 units in the morning ?-Currently on Semglee 14 units daily, sliding scale insulin. ?-Blood sugar level remains consistently less than 200. ?Recent Labs  ?Lab 08/06/21 ?1644 08/06/21 ?1709 08/06/21 ?2053 08/07/21 ?0756 08/07/21 ?1158  ?GLUCAP  185* 194* 164* 193* 137*  ? ?Strongyloidiasis ?-Should have been effectively treated with  Ivermectin x 2 rounds ?-F/u with ID and/or transplant as needed ? ?Goals of care ?  Code Status: Full Code  ? ? ?Mobility: PT eval obtained. ? ?Nutritional status:  ?Body mass index is 27.9 kg/m?.  ?  ?  ? ? ? ? ?Diet:  ?Diet Order   ? ?       ?  Diet renal/carb modified with fluid restriction Diet-HS Snack? Nothing; Fluid restriction: 1200 mL Fluid; Room service appropriate? Yes with Assist; Fluid consistency: Thin  Diet effective now       ?  ? ?  ?  ? ?  ? ? ?DVT prophylaxis:  ?heparin injection 5,000 Units Start: 07/22/21 1400 ?  ?Antimicrobials: Completed a course of antibiotics ?Fluid: None ?Consultants: Nephrology ?Family Communication: None at bedside ? ?Status is: Inpatient ? ?Continue in-hospital care because: Pending appeal for CIR ?Level of care: Med-Surg  ? ?Dispo: The patient is from: Home ?             Anticipated d/c is to: Pending appeal for CIR ?             Patient currently is medically stable to d/c. ?  Difficult to place patient No ? ? ? ? ?Infusions:  ? ? ?Scheduled Meds: ? aspirin  81 mg Oral Daily  ? calcitRIOL  0.25 mcg Oral QODAY  ? calcium acetate  1,334 mg Oral TID WC  ? calcium carbonate  2 tablet Oral QHS  ? Chlorhexidine Gluconate Cloth  6 each Topical Q0600  ? docusate sodium  100 mg Oral BID  ? feeding supplement (NEPRO CARB STEADY)  237 mL Oral BID BM  ? heparin  5,000 Units Subcutaneous Q8H  ? hydrALAZINE  25 mg Oral TID  ? insulin aspart  0-6 Units Subcutaneous TID WC  ? insulin glargine-yfgn  14 Units Subcutaneous Daily  ? isosorbide mononitrate  60 mg Oral QPM  ? ketotifen  1 drop Both Eyes BID  ? kidney failure book   Does not apply Once  ? meclizine  25 mg Oral BID  ? mouth rinse  15 mL Mouth Rinse BID  ? polyethylene glycol  17 g Oral Daily  ? senna-docusate  1 tablet Oral QHS  ? sodium chloride flush  3 mL Intravenous Q12H  ? ? ?PRN meds: ?acetaminophen **OR** acetaminophen, albuterol, calcium carbonate (dosed in mg elemental calcium), docusate sodium, haloperidol lactate,  hydrALAZINE, HYDROcodone-acetaminophen, ondansetron **OR** ondansetron (ZOFRAN) IV, phenol, senna-docusate, sorbitol, zolpidem  ? ?Antimicrobials: ?Anti-infectives (From admission, onward)  ? ? Start     Dose/Rate Route Frequency Ordered Stop  ? 07/29/21 1200  amoxicillin-clavulanate (AUGMENTIN) 500-125 MG per tablet 500 mg       ? 1 tablet Oral Daily 07/29/21 0914 08/04/21 2021  ? ?  ? ? ?Objective: ?Vitals:  ? 08/06/21 2024 08/07/21 0758  ?BP: 121/72 119/65  ?Pulse: 77 83  ?Resp: 18 15  ?Temp: 98.8 ?F (37.1 ?C) 98.2 ?F (36.8 ?C)  ?SpO2: 98% 97%  ? ? ?Intake/Output Summary (Last 24 hours) at 08/07/2021 1328 ?Last data filed at 08/07/2021 0830 ?Gross per 24 hour  ?Intake 243 ml  ?Output --  ?Net 243 ml  ? ?Filed Weights  ? 08/05/21 1321 08/05/21 1709 08/06/21 0500  ?Weight: 88.2 kg 86.5 kg 88.2 kg  ? ?Weight change:  ?Body mass index is 27.9 kg/m?.  ? ?Physical Exam: ?General exam: Pleasant elderly African-American  male.  Not in physical distress ?Skin: No rashes, lesions or ulcers. ?HEENT: Atraumatic, normocephalic, no obvious bleeding ?Lungs: Clear to auscultation bilaterally ?CVS: Regular rate and rhythm, no murmur ?GI/Abd soft, nontender, nondistended, bowel sounds present ?CNS: Alert, awake, oriented x3 ?Psychiatry: Mood appropriate ?Extremities: No pedal edema, no calf tenderness ? ?Data Review: I have personally reviewed the laboratory data and studies available. ? ?F/u labs ordered ?Unresulted Labs (From admission, onward)  ? ?  Start     Ordered  ? 07/24/21 1012  Acid Fast Culture with reflexed sensitivities  RELEASE UPON ORDERING,   TIMED       ?Comments: Specimen B: Phone [phone]         ?Previous Biopsy:  no ?Is the patient on airborne/droplet precautions? No           ?Clinical History:  N/A ?Copy of Report to:  N/A ?Specimen Disposition: OR Specimen Holding ? ?  ? 07/24/21 1012  ? Signed and Held  Renal function panel  Once,   R       ?Question:  Specimen collection method  Answer:  Lab=Lab collect  ?  Signed and Held  ? Signed and Held  CBC  Once,   R       ?Question:  Specimen collection method  Answer:  Lab=Lab collect  ? Signed and Held  ? ?  ?  ? ?  ? ? ?Signed, ?Terrilee Croak, MD ?[facility] ?4

## 2021-08-07 NOTE — Progress Notes (Signed)
Physical Therapy Treatment ?Patient Details ?Name: Melvin Mcclure ?MRN: [id number] ?DOB: 10-04-1950 ?Today's Date: 08/07/2021 ? ? ?History of Present Illness Pt is 71 y.o. male admitted 07/22/21 with worsening SOB; workup for acute on chronic respiratory failure with recurrent pleural effusion initially requiring bipap. S/p thoracentesis 3/21. S/p RLL bronchoscopy, bronchoalveolar lavage, transbronchial lung biopsy 3/22. Course complicated by AKI on CKD; iHD initiated 3/23. Pt also with acute methabolic encephalopathy ; question ICU delirium vs uremia. PMH includes CKD 5 (undergoing transplant evaluation), HFpEF, DM2. ? ?  ?PT Comments  ? ? Pt making gradual progress. Only needing min guard-min A but mod cues for safety with all transfers. He was able to ambulate 100' x2 and performed 5 steps.  Pt's safety continues to be limited by cognition.  Continue plan of care.  ?  ?Recommendations for follow up therapy are one component of a multi-disciplinary discharge planning process, led by the attending physician.  Recommendations may be updated based on patient status, additional functional criteria and insurance authorization. ? ?Follow Up Recommendations ? Acute inpatient rehab (3hours/day) ?  ?  ?Assistance Recommended at Discharge Frequent or constant Supervision/Assistance  ?Patient can return home with the following A little help with walking and/or transfers;A little help with bathing/dressing/bathroom;Assistance with cooking/housework;Direct supervision/assist for medications management;Direct supervision/assist for financial management;Help with stairs or ramp for entrance ?  ?Equipment Recommendations ? Rolling walker (2 wheels);BSC/3in1  ?  ?Recommendations for Other Services   ? ? ?  ?Precautions / Restrictions Precautions ?Precautions: Fall  ?  ? ?Mobility ? Bed Mobility ?Overal bed mobility: Needs Assistance ?Bed Mobility: Supine to Sit ?  ?  ?Supine to sit: Min guard (but mod cues) ?  ?  ?General bed mobility  comments: mod cues, increased time ?  ? ?Transfers ?Overall transfer level: Needs assistance ?Equipment used: Rolling walker (2 wheels) ?Transfers: Sit to/from Stand ?Sit to Stand: Min assist ?  ?  ?  ?  ?  ?General transfer comment: Light min A with mod cues for safety; performed x 2 ?  ? ?Ambulation/Gait ?Ambulation/Gait assistance: Min assist ?Gait Distance (Feet): 100 Feet (100'x2) ?Assistive device: Rolling walker (2 wheels) ?Gait Pattern/deviations: Step-through pattern, Decreased stride length, Trunk flexed ?Gait velocity: Decreased ?  ?  ?General Gait Details: frequent cues for RW managment, posture, and RW proximity (tends to keep too far forward) ? ? ?Stairs ?Stairs: Yes ?Stairs assistance: Min guard ?Stair Management: Two rails, Forwards, Step to pattern ?Number of Stairs: 5 ?General stair comments: cues for sequencing ? ? ?Wheelchair Mobility ?  ? ?Modified Rankin (Stroke Patients Only) ?  ? ? ?  ?Balance Overall balance assessment: Needs assistance ?Sitting-balance support: Feet supported ?Sitting balance-Leahy Scale: Good ?  ?  ?Standing balance support: Bilateral upper extremity supported, During functional activity, No upper extremity supported ?Standing balance-Leahy Scale: Fair ?Standing balance comment: could static stand without AD but RW to ambulate ?  ?  ?  ?  ?  ?  ?  ?  ?  ?  ?  ?  ? ?  ?Cognition Arousal/Alertness: Awake/alert ?Behavior During Therapy: [facility] for tasks assessed/performed ?Overall Cognitive Status: Impaired/Different from baseline ?Area of Impairment: Orientation, Memory, Following commands, Safety/judgement, Awareness, Problem solving ?  ?  ?  ?  ?  ?  ?  ?  ?Orientation Level: Time, Situation ?Current Attention Level: Selective ?Memory: Decreased short-term memory ?Following Commands: Follows one step commands consistently ?Safety/Judgement: Decreased awareness of safety ?Awareness: Emergent ?Problem Solving: Requires verbal cues ?  ?  ?  ? ?  ?  Exercises   ? ?  ?General  Comments   ?  ?  ? ?Pertinent Vitals/Pain Pain Assessment ?Pain Assessment: No/denies pain  ? ? ?Home Living   ?  ?  ?  ?  ?  ?  ?  ?  ?  ?   ?  ?Prior Function    ?  ?  ?   ? ?PT Goals (current goals can now be found in the care plan section) Progress towards PT goals: Progressing toward goals ? ?  ?Frequency ? ? ? Min 4X/week ? ? ? ?  ?PT Plan Current plan remains appropriate  ? ? ?Co-evaluation   ?  ?  ?  ?  ? ?  ?AM-PAC PT "6 Clicks" Mobility   ?Outcome Measure ? Help needed turning from your back to your side while in a flat bed without using bedrails?: A Little ?Help needed moving from lying on your back to sitting on the side of a flat bed without using bedrails?: A Lot (all with mod cues) ?Help needed moving to and from a bed to a chair (including a wheelchair)?: A Lot ?Help needed standing up from a chair using your arms (e.g., wheelchair or bedside chair)?: A Lot ?Help needed to walk in hospital room?: A Lot ?Help needed climbing 3-5 steps with a railing? : A Lot ?6 Click Score: 13 ? ?  ?End of Session Equipment Utilized During Treatment: Gait belt ?Activity Tolerance: Patient tolerated treatment well ?Patient left: in chair;with call bell/phone within reach;with chair alarm set;with family/visitor present ?Nurse Communication: Mobility status ?PT Visit Diagnosis: Unsteadiness on feet (R26.81);Muscle weakness (generalized) (M62.81) ?  ? ? ?Time: 4034-7425 ?PT Time Calculation (min) (ACUTE ONLY): 18 min ? ?Charges:  $Gait Training: 8-22 mins          ?          ? ?Abran Richard, PT ?Acute Rehab Services ?Pager [phone] ?[facility] [phone] ? ? ? ?Mikael Spray Khori Rosevear ?08/07/2021, 12:51 PM ? ?

## 2021-08-07 NOTE — Progress Notes (Signed)
Inpatient Rehab Admissions Coordinator:  ? ?I continue to await a decision on appeal for CIR from [facility]. I called at 12 pm and was told that the case was under nurse review and had not yet been sent to a physician for review. I will continue to follow for potential admit pending insurance auth.  ? ? ?Clemens Catholic, MS, CCC-SLP ?Rehab Admissions Coordinator  ?[phone] (celll) ?[phone] (office) ? ?

## 2021-08-07 NOTE — Progress Notes (Signed)
Initial Nutrition Assessment ? ?DOCUMENTATION CODES:  ? ?Not applicable ? ?INTERVENTION:  ?- Liberalize diet from a renal/carb modified diet to a 2gm sodium diet to provide widest variety of menu options to enhance nutritional adequacy ? ?- D/c Nepro Shakes ? ?- Ensure Enlive po BID, each supplement provides 350 kcal and 20 grams of protein. ? ?- Renal MVI with minerals daily ? ?NUTRITION DIAGNOSIS:  ? ?Increased nutrient needs related to acute illness as evidenced by estimated needs. ? ?GOAL:  ? ?Patient will meet greater than or equal to 90% of their needs ? ?MONITOR:  ? ?PO intake, Supplement acceptance, Diet advancement, Labs, Weight trends, I & O's ? ?REASON FOR ASSESSMENT:  ? ?Consult ?Diet education (new HD) ? ?ASSESSMENT:  ? ?Pt admitted with acute on chronic respiratory failure with hypoxia. PMH significant for T2DM, HTN, HLD, CKD 5, CHF, h/o recurrent pleural effusion on 2L oxygen, chronic anemia, peripheral neuropathy, and chronic foot ulcer. ? ?Pt now ESRD. Started HD 3/23 on MWF schedule and will transition to TThS schedule outpatient.  ? ?Noted plans for possible CIR. Pt is very eager to be d/c as he has a new grandson born 2 weeks ago. ? ?Pt with wife at bedside who provided most history on pt. She states that he typically was eating ~2 meals PTA and he is mindful of amount of salt that he eats in his diet. During admission she states that he has been having a hard time with renal diet as this is a restrictive diet. He has been eating about 33-50% of his meals. He does not like the Nepro shakes but has previously tried Ensure and does not prefer them but is willing to try them again this admission and the addition of a renal MVI.  ? ?Last meal completion on 3/28. ? ?We briefly discussed renal diet for dialysis and limiting of potassium, sodium and phosphorus. They are aware he will be followed by a RD at the outpatient dialysis center. Given his labs are WNL, he is appreciative of a liberalized diet  which will provide more options to help meet his increased nutritional needs.  ? ?Reviewed weight history. Weight has been decreasing since December. Suspect true dry weight loss but unsure how much is true dry weight versus fluid loss. Pt's wife endorses high volume overload upon admission. Will continue to monitor weight trends.  ? ?Post HD weight (4/3) 86.5 kg ? ?Noted nursing documentation of non-pitting BUE and BLE edema. ? ?Medications: calcitriol, phoslo, calcium carbonate, colace, SSI, semglee 14 units daily, miralax, senna ? ?Labs: sodium 131, potassium 3.8 (WNL), BUN 82, Cr 8.16,  ? phos 3.8 (WNL) ? ?I/O's: -14.5 L since admission ? ?(4/3) Net UF: 1.5L ? ?NUTRITION - FOCUSED PHYSICAL EXAM: ? ?Flowsheet Row Most Recent Value  ?Orbital Region No depletion  ?Upper Arm Region Mild depletion  ?Thoracic and Lumbar Region No depletion  ?Buccal Region No depletion  ?Temple Region Mild depletion  ?Clavicle Bone Region No depletion  ?Clavicle and Acromion Bone Region No depletion  ?Scapular Bone Region Mild depletion  ?Dorsal Hand No depletion  ?Patellar Region Mild depletion  ?Anterior Thigh Region Mild depletion  ?Posterior Calf Region No depletion  ?Edema (RD Assessment) Mild  [non-pitting BUE/BLE]  ?Hair Reviewed  ?Eyes Reviewed  ?Mouth Reviewed  ?Skin Reviewed  ?Nails Reviewed  ? ?  ? ?Diet Order:   ?Diet Order   ? ?       ?  Diet 2 gram sodium Room service appropriate?  Yes; Fluid consistency: Thin; Fluid restriction: 1200 mL Fluid  Diet effective now       ?  ? ?  ?  ? ?  ? ?EDUCATION NEEDS:  ? ?Education needs have been addressed ? ?Skin:  Skin Assessment: Reviewed RN Assessment ? ?Last BM:  4/5 ? ?Height:  ? ?Ht Readings from Last 1 Encounters:  ?07/24/21 '5\' 10"'$  (1.778 m)  ? ?Weight:  ? ?Wt Readings from Last 1 Encounters:  ?08/06/21 88.2 kg  ? ?BMI:  Body mass index is 27.9 kg/m?. ? ?Estimated Nutritional Needs:  ? ?Kcal:  2200-2400 ? ?Protein:  110-125g ? ?Fluid:  >/=2.2L ? ?Clayborne Dana, RDN,  LDN ?Clinical Nutrition ?

## 2021-08-07 NOTE — Progress Notes (Signed)
Subjective:  Seen in room.  Feeling well.  No complaints today.   ? ? ?Objective ?Vital signs in last 24 hours: ?Vitals:  ? 08/06/21 1646 08/06/21 2024 08/07/21 0758 08/07/21 1519  ?BP: 127/71 121/72 119/65 135/86  ?Pulse: 80 77 83 73  ?Resp: '16 18 15 17  '$ ?Temp: 98.5 ?F (36.9 ?C) 98.8 ?F (37.1 ?C) 98.2 ?F (36.8 ?C)   ?TempSrc: Oral Oral    ?SpO2: 96% 98% 97% 96%  ?Weight:      ?Height:      ? ?Weight change:  ? ?Intake/Output Summary (Last 24 hours) at 08/07/2021 1523 ?Last data filed at 08/07/2021 0830 ?Gross per 24 hour  ?Intake 243 ml  ?Output --  ?Net 243 ml  ? ? ? ?Assessment/ Plan: ?Pt is a 71 y.o. yo male with advanced CKD at baseline who was admitted on 07/22/2021 with  respiratory failure--  dialysis has been initiated this hospitalization for progression to ESRD ? ?Assessment/Plan: ?1. Renal-  dialysis has been initiated this hospitalization for ESRD-  have been able to use his AVF- first treatment 3/23-   we now have him on a MWF schedule but OP schedule is going to be TTS at [facility]  Mon/Thurs/Sat this week ?2. HTN/volume-  volume status much better, only on hydralazine 25 TID for now ?3. MS-  has only been very slow to improve but is improving ?4. Anemia-  hgb has been great-  has not required any treatment  ?5. Secondary hyperparathyroidism-  on phoslo-  last phos 5.7-  also on calcitriol daily oral -  Pth pending ?6. Dispo- deconditioned-  tried for rehab-  did not get approved, appeal made.  I think he would be a great candidate if insurance would approve ? ? ?Melvin Mcclure  ? ? ?Labs: ?Basic Metabolic Panel: ?Recent Labs  ?Lab 08/01/21 ?4944 08/02/21 ?9675 08/05/21 ?0631  ?NA 133* 130* 131*  ?K 4.7 4.2 3.8  ?CL 90* 89* 91*  ?CO2 '25 23 26  '$ ?GLUCOSE 151* 97 225*  ?BUN 63* 80* 82*  ?CREATININE 7.14* 7.88* 8.16*  ?CALCIUM 8.9 9.2 8.8*  ?PHOS 5.8* 5.7* 3.8  ? ?Liver Function Tests: ?Recent Labs  ?Lab 08/01/21 ?9163 08/02/21 ?8466 08/05/21 ?0631  ?ALBUMIN 3.5 3.4* 3.1*  ? ?No results for input(s): LIPASE,  AMYLASE in the last 168 hours. ?No results for input(s): AMMONIA in the last 168 hours. ?CBC: ?Recent Labs  ?Lab 08/02/21 ?0742 08/05/21 ?0631  ?WBC 13.1* 11.4*  ?HGB 15.7 13.0  ?HCT 47.1 39.9  ?MCV 82.9 84.7  ?PLT 307 266  ? ?Cardiac Enzymes: ?No results for input(s): CKTOTAL, CKMB, CKMBINDEX, TROPONINI in the last 168 hours. ?CBG: ?Recent Labs  ?Lab 08/06/21 ?1644 08/06/21 ?1709 08/06/21 ?2053 08/07/21 ?0756 08/07/21 ?1158  ?GLUCAP 185* 194* 164* 193* 137*  ? ? ?Iron Studies: No results for input(s): IRON, TIBC, TRANSFERRIN, FERRITIN in the last 72 hours. ?Studies/Results: ?No results found. ?Medications: ?Infusions: ? ? ?Scheduled Medications: ? aspirin  81 mg Oral Daily  ? calcitRIOL  0.25 mcg Oral QODAY  ? calcium acetate  1,334 mg Oral TID WC  ? calcium carbonate  2 tablet Oral QHS  ? Chlorhexidine Gluconate Cloth  6 each Topical Q0600  ? docusate sodium  100 mg Oral BID  ? feeding supplement  237 mL Oral BID BM  ? heparin  5,000 Units Subcutaneous Q8H  ? hydrALAZINE  25 mg Oral TID  ? insulin aspart  0-6 Units Subcutaneous TID WC  ? insulin glargine-yfgn  14 Units Subcutaneous Daily  ? isosorbide mononitrate  60 mg Oral QPM  ? ketotifen  1 drop Both Eyes BID  ? kidney failure book   Does not apply Once  ? meclizine  25 mg Oral BID  ? mouth rinse  15 mL Mouth Rinse BID  ? multivitamin  1 tablet Oral QHS  ? polyethylene glycol  17 g Oral Daily  ? senna-docusate  1 tablet Oral QHS  ? sodium chloride flush  3 mL Intravenous Q12H  ? ? have reviewed scheduled and prn medications. ? ?Physical Exam: ?General:  NAD, lying in bed ?Heart: RRR ?Lungs: mostly clear ?Abdomen: soft, non tender ?Extremities: no edema ?Dialysis Access: left upper AVF-  good thrill and bruit   ? ? ?08/07/2021,3:23 PM ? LOS: 15 days  ? ?  ? ? ? ? ?

## 2021-08-07 NOTE — Plan of Care (Signed)
?  Problem: Activity: ?Goal: Capacity to carry out activities will improve ?Outcome: Progressing ?  ?Problem: Education: ?Goal: Knowledge of General Education information will improve ?Description: Including pain rating scale, medication(s)/side effects and non-pharmacologic comfort measures ?Outcome: Progressing ?  ?Problem: Health Behavior/Discharge Planning: ?Goal: Ability to manage health-related needs will improve ?Outcome: Progressing ?  ?Problem: Clinical Measurements: ?Goal: Will remain free from infection ?Outcome: Progressing ?  ?

## 2021-08-07 NOTE — Progress Notes (Signed)
Occupational Therapy Treatment ?Patient Details ?Name: Melvin Mcclure ?MRN: [id number] ?DOB: Sep 18, 1950 ?Today's Date: 08/07/2021 ? ? ?History of present illness Pt is 71 y.o. male admitted 07/22/21 with worsening SOB; workup for acute on chronic respiratory failure with recurrent pleural effusion initially requiring bipap. S/p thoracentesis 3/21. S/p RLL bronchoscopy, bronchoalveolar lavage, transbronchial lung biopsy 3/22. Course complicated by AKI on CKD; iHD initiated 3/23. Pt also with acute methabolic encephalopathy ; question ICU delirium vs uremia. PMH includes CKD 5 (undergoing transplant evaluation), HFpEF, DM2. ?  ?OT comments ? Myrna Blazer is making incremental progress, session focused on cognition this date as pt declined need to complete ADLs. Pt completed the mini cog with a score of 0/5 (indicating a cognitive impairment). He was unable to perceptually draw a clock accurately or recall 3 words after a ~3 minute delay. Pt accredited his performance on being tired but family in agreement that 24/7 supervision A for cognition and safety will be importance at d/c. Pt was able to complete bed mobility and simple transfers with min G- min A for mild unsteadiness but moderate cues throughout for carryover of education and safety. Pt continues to benefit, d/c remains appropriate.   ? ?Recommendations for follow up therapy are one component of a multi-disciplinary discharge planning process, led by the attending physician.  Recommendations may be updated based on patient status, additional functional criteria and insurance authorization. ?   ?Follow Up Recommendations ? Acute inpatient rehab (3hours/day)  ?  ?Assistance Recommended at Discharge Frequent or constant Supervision/Assistance  ?Patient can return home with the following ? A little help with walking and/or transfers;A little help with bathing/dressing/bathroom;Assistance with cooking/housework;Direct supervision/assist for medications management;Direct  supervision/assist for financial management;Assist for transportation;Help with stairs or ramp for entrance ?  ?Equipment Recommendations ? Tub/shower seat  ?  ?Recommendations for Other Services   ? ?  ?Precautions / Restrictions Precautions ?Precautions: Fall ?Precaution Comments: ck sats, monitor incontinence ?Restrictions ?Weight Bearing Restrictions: No  ? ? ?  ? ?Mobility Bed Mobility ?Overal bed mobility: Needs Assistance ?Bed Mobility: Supine to Sit, Sit to Supine ?  ?  ?Supine to sit: Min guard ?Sit to supine: Min guard ?  ?General bed mobility comments: no cues, but mildly unsteady upon sitting ?  ? ?Transfers ?Overall transfer level: Needs assistance ?Equipment used: Rolling walker (2 wheels) ?Transfers: Sit to/from Stand ?Sit to Stand: Min assist ?  ?  ?  ?  ?  ?General transfer comment: cues for hand placement, a little unsteady intially ?  ?  ?Balance Overall balance assessment: Needs assistance ?Sitting-balance support: Feet supported ?Sitting balance-Leahy Scale: Good ?Sitting balance - Comments: able to complete tabletop task in sitting for ~10 minutes without significant LOB ?  ?Standing balance support: Bilateral upper extremity supported, During functional activity, No upper extremity supported ?Standing balance-Leahy Scale: Fair ?Standing balance comment: could static stand without AD but RW to ambulate ?  ?  ?  ?  ?  ?  ?  ?  ?  ?  ?  ?   ? ?ADL either performed or assessed with clinical judgement  ? ?ADL Overall ADL's : Needs assistance/impaired ?  ?  ?  ?  ?  ?  ?  ?  ?  ?  ?  ?  ?  ?  ?  ?  ?  ?  ?  ?General ADL Comments: pt declined all ADLs this date stating he has been "walking" and "taking care of myself all day" - session focued  on cognitive assessment and education ?  ? ?Extremity/Trunk Assessment Upper Extremity Assessment ?Upper Extremity Assessment: Overall WFL for tasks assessed ?  ?Lower Extremity Assessment ?Lower Extremity Assessment: Defer to PT evaluation ?  ?  ?  ? ?Vision    ?Vision Assessment?: No apparent visual deficits ?  ?Perception Perception ?Perception: Impaired ?  ?Praxis Praxis ?Praxis: Not tested ?  ? ?Cognition Arousal/Alertness: Awake/alert ?Behavior During Therapy: Flat affect ?Overall Cognitive Status: Impaired/Different from baseline ?Area of Impairment: Memory, Following commands, Awareness, Safety/judgement, Problem solving ?  ?  ?  ?  ?  ?  ?  ?  ?  ?Current Attention Level: Selective ?Memory: Decreased recall of precautions, Decreased short-term memory ?Following Commands: Follows one step commands with increased time ?Safety/Judgement: Decreased awareness of safety, Decreased awareness of deficits ?Awareness: Emergent ?Problem Solving: Requires verbal cues ?General Comments: Mini cog completed this session with an overall score of 0/5 which suggests cognitive impairment, pt immediately recalled 3/3 words, but was unable to recall after a ~3  minute delay. With contextual clues pt was able to get 1/3 words. His clock had poor spacing and repeating numbers with incorrect clock hand placement. After testing pt denied errors and accredited it to being tired. ?  ?  ?   ?Exercises   ? ?  ?Shoulder Instructions   ? ? ?  ?General Comments VSS on RA  ? ? ?Pertinent Vitals/ Pain       Pain Assessment ?Pain Intervention(s): Monitored during session ? ?Home Living   ?  ?  ?  ?  ?  ?  ?  ?  ?  ?  ?  ?  ?  ?  ?  ?  ?  ?  ? ?  ?Prior Functioning/Environment    ?  ?  ?  ?   ? ?Frequency ? Min 2X/week  ? ? ? ? ?  ?Progress Toward Goals ? ?OT Goals(current goals can now be found in the care plan section) ? Progress towards OT goals: Progressing toward goals ? ?Acute Rehab OT Goals ?Patient Stated Goal: to get some rest ?OT Goal Formulation: With patient/family ?Time For Goal Achievement: 08/12/21 ?Potential to Achieve Goals: Good ?ADL Goals ?Pt Will Perform Eating: with min assist;sitting ?Pt Will Perform Grooming: with min assist;sitting ?Pt Will Transfer to Toilet: with mod  assist;stand pivot transfer;bedside commode ?Additional ADL Goal #1: Pt to demonstrate ability to sit EOB > 7 min during functional tasks with no more than min guard to maintain balance ?Additional ADL Goal #2: Pt to demonstrate ability to follow one step commands >75% of the time  ?Plan Discharge plan remains appropriate   ? ?Co-evaluation ? ? ?   ?  ?  ?  ?  ? ?  ?AM-PAC OT "6 Clicks" Daily Activity     ?Outcome Measure ? ? Help from another person eating meals?: None ?Help from another person taking care of personal grooming?: A Little ?Help from another person toileting, which includes using toliet, bedpan, or urinal?: A Little ?Help from another person bathing (including washing, rinsing, drying)?: A Lot ?Help from another person to put on and taking off regular upper body clothing?: A Little ?Help from another person to put on and taking off regular lower body clothing?: A Little ?6 Click Score: 18 ? ?  ?End of Session Equipment Utilized During Treatment: Rolling walker (2 wheels) ? ?OT Visit Diagnosis: Unsteadiness on feet (R26.81);Other abnormalities of gait and mobility (R26.89);Muscle weakness (generalized) (M62.81);Other symptoms  and signs involving cognitive function ?  ?Activity Tolerance Patient tolerated treatment well ?  ?Patient Left in bed;with call bell/phone within reach;with bed alarm set ?  ?Nurse Communication Mobility status ?  ? ?   ? ?Time: 7915-0569 ?OT Time Calculation (min): 22 min ? ?Charges: OT General Charges ?$OT Visit: 1 Visit ?OT Treatments ?$Therapeutic Activity: 8-22 mins ? ? ? ?Starlette Thurow A Efstathios Sawin ?08/07/2021, 4:48 PM ?

## 2021-08-08 DIAGNOSIS — J9621 Acute and chronic respiratory failure with hypoxia: Secondary | ICD-10-CM | POA: Diagnosis not present

## 2021-08-08 LAB — BASIC METABOLIC PANEL
Anion gap: 10 (ref 5–15)
BUN: 50 mg/dL — ABNORMAL HIGH (ref 8–23)
CO2: 24 mmol/L (ref 22–32)
Calcium: 7.9 mg/dL — ABNORMAL LOW (ref 8.9–10.3)
Chloride: 96 mmol/L — ABNORMAL LOW (ref 98–111)
Creatinine, Ser: 4.37 mg/dL — ABNORMAL HIGH (ref 0.61–1.24)
GFR, Estimated: 14 mL/min — ABNORMAL LOW (ref >60)
Glucose, Bld: 255 mg/dL — ABNORMAL HIGH (ref 70–99)
Potassium: 3.4 mmol/L — ABNORMAL LOW (ref 3.5–5.1)
Sodium: 130 mmol/L — ABNORMAL LOW (ref 135–145)

## 2021-08-08 LAB — GLUCOSE, CAPILLARY
Glucose-Capillary: 146 mg/dL — ABNORMAL HIGH (ref 70–99)
Glucose-Capillary: 182 mg/dL — ABNORMAL HIGH (ref 70–99)
Glucose-Capillary: 114 mg/dL — ABNORMAL HIGH (ref 70–99)

## 2021-08-08 LAB — CBC
HCT: 34 % — ABNORMAL LOW (ref 39.0–52.0)
Hemoglobin: 11 g/dL — ABNORMAL LOW (ref 13.0–17.0)
MCH: 27.8 pg (ref 26.0–34.0)
MCHC: 32.4 g/dL (ref 30.0–36.0)
MCV: 85.9 fL (ref 80.0–100.0)
Platelets: 245 10*3/uL (ref 150–400)
RBC: 3.96 MIL/uL — ABNORMAL LOW (ref 4.22–5.81)
RDW: 15.3 % (ref 11.5–15.5)
WBC: 9.8 10*3/uL (ref 4.0–10.5)
nRBC: 0 % (ref 0.0–0.2)

## 2021-08-08 MED ORDER — INSULIN GLARGINE-YFGN 100 UNIT/ML ~~LOC~~ SOLN
18.0000 [IU] | Freq: Every day | SUBCUTANEOUS | Status: DC
  Administered 2021-08-09 – 2021-08-10 (×2): 18 [IU] via SUBCUTANEOUS
  Filled 2021-08-08 (×2): qty 0.18

## 2021-08-08 NOTE — Progress Notes (Signed)
The patient mentioned he might have a rash and that the area is painful. There is a small rash on his lower back.  The on-call provider has been made aware. ?

## 2021-08-08 NOTE — Progress Notes (Signed)
Patient's wife called to let her know patient will be going to dialysis soon.  ?

## 2021-08-08 NOTE — Progress Notes (Signed)
PT Cancellation Note ? ?Patient Details ?Name: [name] ?MRN: [id number] ?DOB: 04-18-1951 ? ? ?Cancelled Treatment:    Reason Eval/Treat Not Completed: Patient at procedure or test/unavailable;Patient declined, no reason specified, AM: pt off unit at HD, PM: pt declining secondary to post HD fatigue. Pt requesting follow up tomorrow. Will check back tomorrow as schedule allows to continue with PT POC. ? ? ? ? ?Betsey Holiday Trenna Kiely ?08/08/2021, 2:39 PM ?

## 2021-08-08 NOTE — Progress Notes (Signed)
Inpatient Rehab Admissions Coordinator:  ? ?Following for my colleague, Clemens Catholic.  Received update from [facility] appeals this AM.  Appeal has been completed and denial decision remains upheld.  We will not be able to admit this patient to CIR. I let Dr. Pietro Cassis and [facility] team know and I will stop by pt's bedside to inform later this morning.  ? ?Shann Medal, PT, DPT ?Admissions Coordinator ?[phone] ?08/08/21  ?11:01 AM ? ?

## 2021-08-08 NOTE — Progress Notes (Signed)
Subjective:   seen and examined on HD.  BFR 400 mL/ min via AVF UF goal 2.5L.  Tolerating rx well.    ? ? ?Objective ?Vital signs in last 24 hours: ?Vitals:  ? 08/07/21 0758 08/07/21 1519 08/07/21 2029 08/08/21 0900  ?BP: 119/65 135/86 (!) 141/60 (!) 154/76  ?Pulse: 83 73 83 76  ?Resp: '15 17  18  '$ ?Temp: 98.2 ?F (36.8 ?C)  98.2 ?F (36.8 ?C)   ?TempSrc:      ?SpO2: 97% 96% 97%   ?Weight:      ?Height:      ? ?Weight change:  ? ?Intake/Output Summary (Last 24 hours) at 08/08/2021 0927 ?Last data filed at 08/08/2021 0036 ?Gross per 24 hour  ?Intake 240 ml  ?Output --  ?Net 240 ml  ? ? ? ?Assessment/ Plan: ?Pt is a 71 y.o. yo male with advanced CKD at baseline who was admitted on 07/22/2021 with  respiratory failure--  dialysis has been initiated this hospitalization for progression to ESRD ? ?Assessment/Plan: ?1. Renal-  dialysis has been initiated this hospitalization for ESRD-  have been able to use his AVF- first treatment 3/23-  going to be TTS at [facility].   ?- to get him on TTS OP schedule, Mon/Thurs/Sat this week.  HD today 08/08/21 and next rx 08/10/21 ?2. HTN/volume-  volume status much better, on hydral/ imdur ?3. MS-  has only been very slow to improve but is improving ?4. Anemia-  hgb has been great-  has not required any treatment  ?5. Secondary hyperparathyroidism-  on phoslo-  last phos 5.7-  also on calcitriol daily oral -  Pth pending ?6. Dispo- deconditioned-  tried for rehab-  did not get approved, appeal made.  I think he would be a great candidate if insurance would approve ? ? ?Brecken Walth  ? ? ?Labs: ?Basic Metabolic Panel: ?Recent Labs  ?Lab 08/01/21 ?4008 08/02/21 ?6761 08/05/21 ?0631 08/08/21 ?9509  ?NA 133* 130* 131* 130*  ?K 4.7 4.2 3.8 3.4*  ?CL 90* 89* 91* 96*  ?CO2 '25 23 26 24  '$ ?GLUCOSE 151* 97 225* 255*  ?BUN 63* 80* 82* 50*  ?CREATININE 7.14* 7.88* 8.16* 4.37*  ?CALCIUM 8.9 9.2 8.8* 7.9*  ?PHOS 5.8* 5.7* 3.8  --   ? ?Liver Function Tests: ?Recent Labs  ?Lab 08/01/21 ?3267 08/02/21 ?1245  08/05/21 ?0631  ?ALBUMIN 3.5 3.4* 3.1*  ? ?No results for input(s): LIPASE, AMYLASE in the last 168 hours. ?No results for input(s): AMMONIA in the last 168 hours. ?CBC: ?Recent Labs  ?Lab 08/02/21 ?8099 08/05/21 ?0631 08/08/21 ?8338  ?WBC 13.1* 11.4* 9.8  ?HGB 15.7 13.0 11.0*  ?HCT 47.1 39.9 34.0*  ?MCV 82.9 84.7 85.9  ?PLT 307 266 245  ? ?Cardiac Enzymes: ?No results for input(s): CKTOTAL, CKMB, CKMBINDEX, TROPONINI in the last 168 hours. ?CBG: ?Recent Labs  ?Lab 08/06/21 ?2053 08/07/21 ?0756 08/07/21 ?1158 08/07/21 ?1606 08/07/21 ?2030  ?GLUCAP 164* 193* 137* 180* 263*  ? ? ?Iron Studies: No results for input(s): IRON, TIBC, TRANSFERRIN, FERRITIN in the last 72 hours. ?Studies/Results: ?No results found. ?Medications: ?Infusions: ? sodium chloride    ? sodium chloride    ? ? ?Scheduled Medications: ? aspirin  81 mg Oral Daily  ? calcitRIOL  0.25 mcg Oral QODAY  ? calcium acetate  1,334 mg Oral TID WC  ? calcium carbonate  2 tablet Oral QHS  ? Chlorhexidine Gluconate Cloth  6 each Topical Q0600  ? Chlorhexidine Gluconate Cloth  6 each  Topical Q0600  ? docusate sodium  100 mg Oral BID  ? feeding supplement  237 mL Oral BID BM  ? heparin  5,000 Units Subcutaneous Q8H  ? hydrALAZINE  25 mg Oral TID  ? insulin aspart  0-6 Units Subcutaneous TID WC  ? insulin glargine-yfgn  14 Units Subcutaneous Daily  ? isosorbide mononitrate  60 mg Oral QPM  ? ketotifen  1 drop Both Eyes BID  ? kidney failure book   Does not apply Once  ? meclizine  25 mg Oral BID  ? mouth rinse  15 mL Mouth Rinse BID  ? multivitamin  1 tablet Oral QHS  ? polyethylene glycol  17 g Oral Daily  ? senna-docusate  1 tablet Oral QHS  ? sodium chloride flush  3 mL Intravenous Q12H  ? ? have reviewed scheduled and prn medications. ? ?Physical Exam: ?General:  NAD, lying in bed ?Heart: RRR ?Lungs: mostly clear ?Abdomen: soft, non tender ?Extremities: no edema ?Dialysis Access: left upper AVF-  good thrill and bruit   ? ? ?08/08/2021,9:27 AM ? LOS: 16 days   ? ?  ? ? ? ? ?

## 2021-08-08 NOTE — Progress Notes (Signed)
?PROGRESS NOTE ? ?Melvin Mcclure  ?DOB: [birth year]  ?PCP: Nolene Ebbs, MD ?MHD:[id number]  ?DOA: 07/22/2021 ? LOS: 16 days  ?Hospital Day: 18 ? ?Brief narrative: ?Melvin Mcclure is a 71 y.o. male with PMH significant for DM 2, HTN, HLD, chronic diastolic CHF, history of recurrent pleural effusion on 2 L oxygen at home, CKD 5, chronic anemia, iron deficiency anemia, peripheral neuropathy, chronic foot ulcer, arthritis, anxiety.  Patient was undergoing evaluation for renal transplant.  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  ?Patient presented to ED on 3/20 for shortness of breath.   ?In the ED, O2 sat 75% room air required 4 L oxygen. ?Imaging showed right pleural effusion, labs showed worsening renal function ?Admitted to hospitalist service ?Pulmonology and nephrology were consulted. ? ?3/21, underwent thoracentesis with removal of 1500 mill of fluid ?3/22, underwent flexible bronchoscopy with brushing and bronchial rule out level as an transbronchial lung biopsy for nonresolving obesity recurrent pleural effusion ?His oxygen requirement worsened and required BiPAP ?3/23, he was transferred to ICU on BiPAP.  He subsequently improved to high flow nasal cannula.  Nighttime BiPAP was recommended but patient refused. ?3/23, patient was started on dialysis 3 days fistula. ?3/29, transferred back to [facility]  ? ?Subjective: ?Patient was seen and examined this morning in dialysis unit.  Not in distress.  No new symptoms.  Insurance refused CIR appeal. ? ?Principal Problem: ?  Acute on chronic respiratory failure with hypoxia ([place]) ?Active Problems: ?  Recurrent pleural effusion on right ?  Acute metabolic encephalopathy ?  Stage 5 chronic kidney disease ([place]) ?  Chronic diastolic CHF (congestive heart failure) ([place]) ?  DM2 (diabetes mellitus, type 2) ([place]) ?  HTN (hypertension) ?  Strongyloidiasis ?  ? ?Assessment and Plan: ?Acute on chronic respiratory failure with  hypoxia ([place]) ?Recurrent pleural effusion ?Chronic bibasilar infiltrate ?-Seen by pulmonary.  Underwent thoracentesis.  Underwent bronchoscopy and brushing ?-BAL positive for Prevotella.  Completed course of Augmentin. ?-Respiratory status stable.  Breathing on room air. ?-WBC count improving, 9.8 today. ?Recent Labs  ?Lab 08/02/21 ?2119 08/05/21 ?0631 08/08/21 ?4174  ?WBC 13.1* 11.4* 9.8  ? ?Acute metabolic encephalopathy ?-Likely ICU delirium component to confusion/lethargy vs uremia ? ?New ESRD ?-Nephrology consult was obtained.  Initially tried on high-dose Lasix without improvement.  Patient was subsequently started on hemodialysis. ?Currently being dialyzed on a Monday Wednesday Friday schedule.  He is being transitioned to a Tuesday Thursday Saturday schedule which is what is being planned in the outpatient setting.   ? ?Chronic diastolic CHF ?Essential hypertension ?-Volume being managed with hemodialysis.   ?-2D echocardiogram done 07/23/2021 shows preserved LV function with LV ejection fraction of 55 to 60% with no regional wall motion abnormality and grade 2 diastolic dysfunction. ?-Blood pressure drops during dialysis intermittently. ?-Currently on hydralazine 25 mg 3 times daily, Imdur 60 mg daily.  Off amlodipine and Coreg.  Torsemide, metolazone stopped ? ?Type 2 diabetes mellitus ?-A1c 7 on 07/22/2021 ?-Home meds includ Lantus 20 units in the morning ?-Currently on Semglee 14 units daily, sliding scale insulin. ?-Blood sugar level seems elevated mostly.  Increase Semglee to 18 units for tomorrow. ?Recent Labs  ?Lab 08/07/21 ?0756 08/07/21 ?1158 08/07/21 ?1606 08/07/21 ?2030 08/08/21 ?1330  ?GLUCAP 193* 137* 180* 263* 146*  ? ?Strongyloidiasis ?-Should have been effectively treated with Ivermectin x 2 rounds ?-F/u with ID and/or transplant as needed ? ?Goals of care ?  Code Status: Full Code  ? ? ?  Mobility: PT eval obtained. ? ?Nutritional status:  ?Body mass index is 27.9 kg/m?Marland Kitchen  ?Nutrition Problem:  Increased nutrient needs ?Etiology: acute illness ?Signs/Symptoms: estimated needs ? ? ? ? ?Diet:  ?Diet Order   ? ?       ?  Diet 2 gram sodium Room service appropriate? Yes; Fluid consistency: Thin; Fluid restriction: 1200 mL Fluid  Diet effective now       ?  ? ?  ?  ? ?  ? ? ?DVT prophylaxis:  ?heparin injection 5,000 Units Start: 07/22/21 1400 ?  ?Antimicrobials: Completed a course of antibiotics ?Fluid: None ?Consultants: Nephrology ?Family Communication: None at bedside ? ?Status is: Inpatient ? ?Continue in-hospital care because: CIR appeal denied.  SNF likely ?Level of care: Med-Surg  ? ?Dispo: The patient is from: Home ?             Anticipated d/c is to: SNF likely ?             Patient currently is medically stable to d/c. ?  Difficult to place patient No ? ? ? ? ?Infusions:  ? sodium chloride    ? sodium chloride    ? ? ?Scheduled Meds: ? aspirin  81 mg Oral Daily  ? calcitRIOL  0.25 mcg Oral QODAY  ? calcium acetate  1,334 mg Oral TID WC  ? calcium carbonate  2 tablet Oral QHS  ? Chlorhexidine Gluconate Cloth  6 each Topical Q0600  ? Chlorhexidine Gluconate Cloth  6 each Topical Q0600  ? docusate sodium  100 mg Oral BID  ? feeding supplement  237 mL Oral BID BM  ? heparin  5,000 Units Subcutaneous Q8H  ? hydrALAZINE  25 mg Oral TID  ? insulin aspart  0-6 Units Subcutaneous TID WC  ? [START ON 08/09/2021] insulin glargine-yfgn  18 Units Subcutaneous Daily  ? isosorbide mononitrate  60 mg Oral QPM  ? ketotifen  1 drop Both Eyes BID  ? kidney failure book   Does not apply Once  ? meclizine  25 mg Oral BID  ? mouth rinse  15 mL Mouth Rinse BID  ? multivitamin  1 tablet Oral QHS  ? polyethylene glycol  17 g Oral Daily  ? senna-docusate  1 tablet Oral QHS  ? sodium chloride flush  3 mL Intravenous Q12H  ? ? ?PRN meds: ?sodium chloride, sodium chloride, acetaminophen **OR** acetaminophen, albuterol, calcium carbonate (dosed in mg elemental calcium), docusate sodium, haloperidol lactate, heparin, hydrALAZINE,  HYDROcodone-acetaminophen, lidocaine (PF), lidocaine-prilocaine, ondansetron **OR** ondansetron (ZOFRAN) IV, pentafluoroprop-tetrafluoroeth, phenol, senna-docusate, sorbitol, zolpidem  ? ?Antimicrobials: ?Anti-infectives (From admission, onward)  ? ? Start     Dose/Rate Route Frequency Ordered Stop  ? 07/29/21 1200  amoxicillin-clavulanate (AUGMENTIN) 500-125 MG per tablet 500 mg       ? 1 tablet Oral Daily 07/29/21 0914 08/04/21 2021  ? ?  ? ? ?Objective: ?Vitals:  ? 08/08/21 1151 08/08/21 1305  ?BP: (!) 129/59 130/80  ?Pulse: 75 80  ?Resp: 20 18  ?Temp:  97.7 ?F (36.5 ?C)  ?SpO2:  100%  ? ? ?Intake/Output Summary (Last 24 hours) at 08/08/2021 1434 ?Last data filed at 08/08/2021 1151 ?Gross per 24 hour  ?Intake 240 ml  ?Output 2500 ml  ?Net -2260 ml  ? ?Filed Weights  ? 08/05/21 1709 08/06/21 0500 08/08/21 0700  ?Weight: 86.5 kg 88.2 kg 88.2 kg  ? ?Weight change:  ?Body mass index is 27.9 kg/m?.  ? ?Physical Exam: ?General exam: Pleasant elderly African-American  male.  Not in physical distress ?Skin: No rashes, lesions or ulcers. ?HEENT: Atraumatic, normocephalic, no obvious bleeding ?Lungs: Clear to auscultation bilaterally ?CVS: Regular rate and rhythm, no murmur ?GI/Abd soft, nontender, nondistended, bowel sounds present ?CNS: Alert, awake, oriented x3 ?Psychiatry: Mood appropriate ?Extremities: No pedal edema, no calf tenderness ? ?Data Review: I have personally reviewed the laboratory data and studies available. ? ?F/u labs ordered ?Unresulted Labs (From admission, onward)  ? ?  Start     Ordered  ? 07/24/21 1012  Acid Fast Culture with reflexed sensitivities  RELEASE UPON ORDERING,   TIMED       ?Comments: Specimen B: Phone [phone]         ?Previous Biopsy:  no ?Is the patient on airborne/droplet precautions? No           ?Clinical History:  N/A ?Copy of Report to:  N/A ?Specimen Disposition: OR Specimen Holding ? ?  ? 07/24/21 1012  ? Signed and Held  Renal function panel  Once,   R       ?Question:  Specimen  collection method  Answer:  Lab=Lab collect  ? Signed and Held  ? Signed and Held  CBC  Once,   R       ?Question:  Specimen collection method  Answer:  Lab=Lab collect  ? Signed and Held  ? Signed and Held

## 2021-08-08 NOTE — Care Management Important Message (Signed)
Important Message ? ?Patient Details  ?Name: Melvin Mcclure ?MRN: [id number] ?Date of Birth: 11-04-1950 ? ? ?Medicare Important Message Given:  Yes ? ? ? ? ?Levada Dy  Shogo Larkey-Martin ?08/08/2021, 1:20 PM ?

## 2021-08-08 NOTE — TOC Progression Note (Addendum)
Transition of Care (TOC) - Progression Note  ? ? ?Patient Details  ?Name: Melvin Mcclure ?MRN: [id number] ?Date of Birth: 12-02-1950 ? ?Transition of Care (TOC) CM/SW Contact  ?Joanne Chars, LCSW ?Phone Number: ?08/08/2021, 1:50 PM ? ?Clinical Narrative:   CSW informed CIR appeal has been denied.  CSW spoke with pt wife [name] regarding SNF bed offers and she would like to choose [facility].  CSW spoke with [facility] who confirms she can accept pt, is aware that pt is new HD.   ? ?Per Olivia Mackie Mounce/renal: [facility] on TTS. 7:00 arrival for 7:20 chair. For the first appt, pt will need to arrive at 6:30 to complete paperwork prior to treatment ? ?Auth request submitted in [place]. ? ?4166: [place] application emailed to [employer]. ? ?Expected Discharge Plan: [place] ?Barriers to Discharge: Other (must enter comment), SNF Pending bed offer (auth denied by insurance for CIR, under appeal) ? ?Expected Discharge Plan and Services ?Expected Discharge Plan: [place] ?In-house Referral: Clinical Social Work ?  ?Post Acute Care Choice: IP Rehab ?Living arrangements for the past 2 months: [place] ?                ?  ?  ?  ?  ?  ?  ?  ?  ?  ?  ? ? ?Social Determinants of Health (SDOH) Interventions ?  ? ?Readmission Risk Interventions ? ?  04/20/2020  ?  7:52 PM  ?Readmission Risk Prevention Plan  ?Transportation Screening Complete  ?Medication Review Press photographer) Complete  ?PCP or Specialist appointment within 3-5 days of discharge Complete  ?[place] or Home Care Consult Complete  ?SW Recovery Care/Counseling Consult Complete  ?Palliative Care Screening Not Applicable  ?[place] Not Applicable  ? ? ?

## 2021-08-09 DIAGNOSIS — J9621 Acute and chronic respiratory failure with hypoxia: Secondary | ICD-10-CM | POA: Diagnosis not present

## 2021-08-09 LAB — GLUCOSE, CAPILLARY
Glucose-Capillary: 131 mg/dL — ABNORMAL HIGH (ref 70–99)
Glucose-Capillary: 120 mg/dL — ABNORMAL HIGH (ref 70–99)
Glucose-Capillary: 145 mg/dL — ABNORMAL HIGH (ref 70–99)
Glucose-Capillary: 169 mg/dL — ABNORMAL HIGH (ref 70–99)

## 2021-08-09 NOTE — Progress Notes (Signed)
?PROGRESS NOTE ? ?Melvin Mcclure  ?DOB: [birth year]  ?PCP: Nolene Ebbs, MD ?VWU:[id number]  ?DOA: 07/22/2021 ? LOS: 17 days  ?Hospital Day: 19 ? ?Brief narrative: ?Melvin Mcclure is a 71 y.o. male with PMH significant for DM 2, HTN, HLD, chronic diastolic CHF, history of recurrent pleural effusion on 2 L oxygen at home, CKD 5, chronic anemia, iron deficiency anemia, peripheral neuropathy, chronic foot ulcer, arthritis, anxiety.  Patient was undergoing evaluation for renal transplant.  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  ?Patient presented to ED on 3/20 for shortness of breath.   ?In the ED, O2 sat 75% room air required 4 L oxygen. ?Imaging showed right pleural effusion, labs showed worsening renal function ?Admitted to hospitalist service ?Pulmonology and nephrology were consulted. ? ?3/21, underwent thoracentesis with removal of 1500 mill of fluid ?3/22, underwent flexible bronchoscopy with brushing and bronchial rule out level as an transbronchial lung biopsy for nonresolving obesity recurrent pleural effusion ?His oxygen requirement worsened and required BiPAP ?3/23, he was transferred to ICU on BiPAP.  He subsequently improved to high flow nasal cannula.  Nighttime BiPAP was recommended but patient refused. ?3/23, patient was started on dialysis 3 days fistula. ?3/29, transferred back to [facility]  ? ?Subjective: ?Patient was seen and examined this morning. ?Lying down on bed.  Not in physical distress.  Waiting for SNF approval. ? ?Principal Problem: ?  Acute on chronic respiratory failure with hypoxia ([place]) ?Active Problems: ?  Recurrent pleural effusion on right ?  Acute metabolic encephalopathy ?  Stage 5 chronic kidney disease ([place]) ?  Chronic diastolic CHF (congestive heart failure) ([place]) ?  DM2 (diabetes mellitus, type 2) ([place]) ?  HTN (hypertension) ?  Strongyloidiasis ?  ? ?Assessment and Plan: ?Acute on chronic respiratory failure with hypoxia  ([place]) ?Recurrent pleural effusion ?Chronic bibasilar infiltrate ?-Seen by pulmonary.  Underwent thoracentesis.  Underwent bronchoscopy and brushing ?-BAL positive for Prevotella.  Completed course of Augmentin. ?-Respiratory status stable.  Breathing on room air. ?-WBC count improving, 9.8 on last check yesterday. ?Recent Labs  ?Lab 08/05/21 ?0631 08/08/21 ?2956  ?WBC 11.4* 9.8  ? ?Acute metabolic encephalopathy ?-Likely ICU delirium component to confusion/lethargy vs uremia ?-Mental status gradually improving. ? ?New ESRD ?-Nephrology consult was obtained.  Initially tried on high-dose Lasix without improvement.  Patient was subsequently started on hemodialysis. ?Currently being dialyzed on a Monday Wednesday Friday schedule.  He is being transitioned to a Tuesday Thursday Saturday schedule which is what is being planned in the outpatient setting.   ? ?Chronic diastolic CHF ?Essential hypertension ?-Volume being managed with hemodialysis.   ?-2D echocardiogram done 07/23/2021 shows preserved LV function with LV ejection fraction of 55 to 60% with no regional wall motion abnormality and grade 2 diastolic dysfunction. ?-Blood pressure drops during dialysis intermittently. ?-Currently on hydralazine 25 mg 3 times daily, Imdur 60 mg daily.  Off amlodipine and Coreg.  Torsemide, metolazone stopped ? ?Type 2 diabetes mellitus ?-A1c 7 on 07/22/2021 ?-Home meds includ Lantus 20 units in the morning ?-Currently on Semglee 18 units daily, sliding scale insulin. ?-Blood sugar level controlled. ?Recent Labs  ?Lab 08/08/21 ?1330 08/08/21 ?1602 08/08/21 ?2059 08/09/21 ?0820 08/09/21 ?1202  ?GLUCAP 146* 182* 114* 131* 120*  ? ?Strongyloidiasis ?-Should have been effectively treated with Ivermectin x 2 rounds ?-F/u with ID and/or transplant as needed ? ?Goals of care ?  Code Status: Full Code  ? ? ?Mobility: PT eval obtained. ? ?Nutritional status:  ?  Body mass index is 27.74 kg/m?Marland Kitchen  ?Nutrition Problem: Increased nutrient  needs ?Etiology: acute illness ?Signs/Symptoms: estimated needs ? ? ? ? ?Diet:  ?Diet Order   ? ?       ?  Diet 2 gram sodium Room service appropriate? Yes; Fluid consistency: Thin; Fluid restriction: 1200 mL Fluid  Diet effective now       ?  ? ?  ?  ? ?  ? ? ?DVT prophylaxis:  ?heparin injection 5,000 Units Start: 07/22/21 1400 ?  ?Antimicrobials: Completed a course of antibiotics ?Fluid: None ?Consultants: Nephrology ?Family Communication: None at bedside ? ?Status is: Inpatient ? ?Continue in-hospital care because: CIR appeal denied.  SNF authorization pending. ?Level of care: Med-Surg  ? ?Dispo: The patient is from: Home ?             Anticipated d/c is to: SNF authorized and pending.  If not authorized, backup plan will be home with home health PT. ?             Patient currently is medically stable to d/c. ?  Difficult to place patient No ? ? ? ? ?Infusions:  ? sodium chloride    ? sodium chloride    ? ? ?Scheduled Meds: ? aspirin  81 mg Oral Daily  ? calcitRIOL  0.25 mcg Oral QODAY  ? calcium acetate  1,334 mg Oral TID WC  ? calcium carbonate  2 tablet Oral QHS  ? Chlorhexidine Gluconate Cloth  6 each Topical Q0600  ? Chlorhexidine Gluconate Cloth  6 each Topical Q0600  ? docusate sodium  100 mg Oral BID  ? feeding supplement  237 mL Oral BID BM  ? heparin  5,000 Units Subcutaneous Q8H  ? hydrALAZINE  25 mg Oral TID  ? insulin aspart  0-6 Units Subcutaneous TID WC  ? insulin glargine-yfgn  18 Units Subcutaneous Daily  ? isosorbide mononitrate  60 mg Oral QPM  ? ketotifen  1 drop Both Eyes BID  ? kidney failure book   Does not apply Once  ? meclizine  25 mg Oral BID  ? mouth rinse  15 mL Mouth Rinse BID  ? multivitamin  1 tablet Oral QHS  ? polyethylene glycol  17 g Oral Daily  ? senna-docusate  1 tablet Oral QHS  ? sodium chloride flush  3 mL Intravenous Q12H  ? ? ?PRN meds: ?sodium chloride, sodium chloride, acetaminophen **OR** acetaminophen, albuterol, calcium carbonate (dosed in mg elemental calcium),  docusate sodium, haloperidol lactate, heparin, hydrALAZINE, HYDROcodone-acetaminophen, lidocaine (PF), lidocaine-prilocaine, ondansetron **OR** ondansetron (ZOFRAN) IV, pentafluoroprop-tetrafluoroeth, phenol, senna-docusate, sorbitol, zolpidem  ? ?Antimicrobials: ?Anti-infectives (From admission, onward)  ? ? Start     Dose/Rate Route Frequency Ordered Stop  ? 07/29/21 1200  amoxicillin-clavulanate (AUGMENTIN) 500-125 MG per tablet 500 mg       ? 1 tablet Oral Daily 07/29/21 0914 08/04/21 2021  ? ?  ? ? ?Objective: ?Vitals:  ? 08/09/21 0612 08/09/21 0824  ?BP: (!) 165/74 135/71  ?Pulse: 83 88  ?Resp: 18 16  ?Temp: 98.6 ?F (37 ?C) 98.5 ?F (36.9 ?C)  ?SpO2: 100% 100%  ? ?No intake or output data in the 24 hours ending 08/09/21 1318 ? ?Filed Weights  ? 08/06/21 0500 08/08/21 0700 08/09/21 0612  ?Weight: 88.2 kg 88.2 kg 87.7 kg  ? ?Weight change: -0.5 kg ?Body mass index is 27.74 kg/m?.  ? ?Physical Exam: ?General exam: Pleasant elderly African-American male.  Not in physical distress ?Skin: No rashes, lesions or  ulcers. ?HEENT: Atraumatic, normocephalic, no obvious bleeding ?Lungs: Clear to auscultation bilaterally ?CVS: Regular rate and rhythm, no murmur ?GI/Abd soft, nontender, nondistended, bowel sounds present ?CNS: Alert, awake, oriented x3 ?Psychiatry: Mood appropriate ?Extremities: No pedal edema, no calf tenderness ? ?Data Review: I have personally reviewed the laboratory data and studies available. ? ?F/u labs ordered ?Unresulted Labs (From admission, onward)  ? ?  Start     Ordered  ? 07/24/21 1012  Acid Fast Culture with reflexed sensitivities  RELEASE UPON ORDERING,   TIMED       ?Comments: Specimen B: Phone [phone]         ?Previous Biopsy:  no ?Is the patient on airborne/droplet precautions? No           ?Clinical History:  N/A ?Copy of Report to:  N/A ?Specimen Disposition: OR Specimen Holding ? ?  ? 07/24/21 1012  ? Signed and Held  Renal function panel  Once,   R       ?Question:  Specimen collection  method  Answer:  Lab=Lab collect  ? Signed and Held  ? Signed and Held  CBC  Once,   R       ?Question:  Specimen collection method  Answer:  Lab=Lab collect  ? Signed and Held  ? Signed and Held  Renal function panel

## 2021-08-09 NOTE — TOC Progression Note (Addendum)
Transition of Care (TOC) - Progression Note  ? ? ?Patient Details  ?Name: Melvin Mcclure ?MRN: [id number] ?Date of Birth: 1950/06/20 ? ?Transition of Care (TOC) CM/SW Contact  ?Joanne Chars, LCSW ?Phone Number: ?08/09/2021, 12:56 PM ? ?Clinical Narrative:    ?CSW LM with courtney Rorie/[facility]--not sure if she is working today. ? ? ?1000:CSW received call from [facility]ace [facility] request is being sent for medical review. ?1220: CSW spoke with pt wife Melvin Mcclure, updated her that SNF auth has been sent for second level review, may not be approved.  Discussed pt going home with [facility], Melvin Mcclure aware this may need to happen, wants to see if SNF approved.  She has put in for FMLA at work. HD in place and she can transport.   ? ?6203: SNF auth approved: [id number], [id number], 5 days: 4/7-4/11. ? ?CSW called [facility], pt has not been approved for transport yet, may not be approved prior to Tuesday.  CSW spoke with pt wife Melvin Mcclure and she will be back up plan for HD transport. ? ? ? ? ?Expected Discharge Plan: [place] ?Barriers to Discharge: Other (must enter comment), SNF Pending bed offer (auth denied by insurance for CIR, under appeal) ? ?Expected Discharge Plan and Services ?Expected Discharge Plan: [place] ?In-house Referral: Clinical Social Work ?  ?Post Acute Care Choice: IP Rehab ?Living arrangements for the past 2 months: [place] ?                ?  ?  ?  ?  ?  ?  ?  ?  ?  ?  ? ? ?Social Determinants of Health (SDOH) Interventions ?  ? ?Readmission Risk Interventions ? ?  04/20/2020  ?  7:52 PM  ?Readmission Risk Prevention Plan  ?Transportation Screening Complete  ?Medication Review Press photographer) Complete  ?PCP or Specialist appointment within 3-5 days of discharge Complete  ?[place] or Home Care Consult Complete  ?SW Recovery Care/Counseling Consult Complete  ?Palliative Care Screening Not Applicable  ?[place] Not Applicable  ? ? ?

## 2021-08-09 NOTE — Progress Notes (Signed)
Subjective:   Seen in room- about to work with PT.  Seems grumpy this AM. ? ?Objective ?Vital signs in last 24 hours: ?Vitals:  ? 08/08/21 1733 08/08/21 2100 08/09/21 0612 08/09/21 0824  ?BP: 114/60 126/61 (!) 165/74 135/71  ?Pulse: 73 88 83 88  ?Resp: '18 18 18 16  '$ ?Temp:  98.4 ?F (36.9 ?C) 98.6 ?F (37 ?C) 98.5 ?F (36.9 ?C)  ?TempSrc:  Oral Oral Oral  ?SpO2: 100% 94% 100% 100%  ?Weight:   87.7 kg   ?Height:      ? ?Weight change: -0.5 kg ?No intake or output data in the 24 hours ending 08/09/21 1220 ? ? ? ?Assessment/ Plan: ?Pt is a 71 y.o. yo male with advanced CKD at baseline who was admitted on 07/22/2021 with  respiratory failure--  dialysis has been initiated this hospitalization for progression to ESRD ? ?Assessment/Plan: ?1. Renal-  dialysis has been initiated this hospitalization for ESRD-  have been able to use his AVF- first treatment 3/23-  going to be TTS at [facility].   ?- to get him on TTS OP schedule, Mon/Thurs/Sat this week.  HD 08/08/21 and next rx 08/10/21 ?2. HTN/volume-  volume status much better, on hydral/ imdur ?3. MS-  has only been very slow to improve but is improving ?4. Anemia-  hgb has been great-  has not required any treatment  ?5. Secondary hyperparathyroidism-  on phoslo-  last phos 5.7-  also on calcitriol daily oral -  Pth pending ?6. Dispo- deconditioned-  tried for rehab-  did not get approved, appeal made.  I think he would be a great candidate if insurance would approve ? ? ?Conita Amenta  ? ? ?Labs: ?Basic Metabolic Panel: ?Recent Labs  ?Lab 08/05/21 ?0631 08/08/21 ?2025  ?NA 131* 130*  ?K 3.8 3.4*  ?CL 91* 96*  ?CO2 26 24  ?GLUCOSE 225* 255*  ?BUN 82* 50*  ?CREATININE 8.16* 4.37*  ?CALCIUM 8.8* 7.9*  ?PHOS 3.8  --   ? ?Liver Function Tests: ?Recent Labs  ?Lab 08/05/21 ?0631  ?ALBUMIN 3.1*  ? ?No results for input(s): LIPASE, AMYLASE in the last 168 hours. ?No results for input(s): AMMONIA in the last 168 hours. ?CBC: ?Recent Labs  ?Lab 08/05/21 ?0631 08/08/21 ?4270  ?WBC 11.4* 9.8   ?HGB 13.0 11.0*  ?HCT 39.9 34.0*  ?MCV 84.7 85.9  ?PLT 266 245  ? ?Cardiac Enzymes: ?No results for input(s): CKTOTAL, CKMB, CKMBINDEX, TROPONINI in the last 168 hours. ?CBG: ?Recent Labs  ?Lab 08/08/21 ?1330 08/08/21 ?1602 08/08/21 ?2059 08/09/21 ?0820 08/09/21 ?1202  ?GLUCAP 146* 182* 114* 131* 120*  ? ? ?Iron Studies: No results for input(s): IRON, TIBC, TRANSFERRIN, FERRITIN in the last 72 hours. ?Studies/Results: ?No results found. ?Medications: ?Infusions: ? sodium chloride    ? sodium chloride    ? ? ?Scheduled Medications: ? aspirin  81 mg Oral Daily  ? calcitRIOL  0.25 mcg Oral QODAY  ? calcium acetate  1,334 mg Oral TID WC  ? calcium carbonate  2 tablet Oral QHS  ? Chlorhexidine Gluconate Cloth  6 each Topical Q0600  ? Chlorhexidine Gluconate Cloth  6 each Topical Q0600  ? docusate sodium  100 mg Oral BID  ? feeding supplement  237 mL Oral BID BM  ? heparin  5,000 Units Subcutaneous Q8H  ? hydrALAZINE  25 mg Oral TID  ? insulin aspart  0-6 Units Subcutaneous TID WC  ? insulin glargine-yfgn  18 Units Subcutaneous Daily  ? isosorbide mononitrate  60 mg Oral QPM  ? ketotifen  1 drop Both Eyes BID  ? kidney failure book   Does not apply Once  ? meclizine  25 mg Oral BID  ? mouth rinse  15 mL Mouth Rinse BID  ? multivitamin  1 tablet Oral QHS  ? polyethylene glycol  17 g Oral Daily  ? senna-docusate  1 tablet Oral QHS  ? sodium chloride flush  3 mL Intravenous Q12H  ? ? have reviewed scheduled and prn medications. ? ?Physical Exam: ?General:  NAD, sitting on edge of bed ?Heart: RRR ?Lungs: clear bilaterally ?Abdomen: soft, non tender ?Extremities: no edema ?Dialysis Access: left upper AVF-  good thrill and bruit   ? ? ?08/09/2021,12:20 PM ? LOS: 17 days  ? ?  ? ? ? ? ?

## 2021-08-09 NOTE — Progress Notes (Signed)
Case discussed with CSW. Pt will not d/c to snf today and start at clinic tomorrow. Contacted [facility] and spoke to [place], [place] manager. Clinic advised pt will not start tomorrow but should start on Tuesday. Contacted renal PA regarding pt's possible d/c to snf or home this weekend and clinic's need for orders. Also, spoke to pt's wife via phone this morning. Reviewed pt's out-pt HD arrangements and wife aware that pt will need to arrive at 6:30 for first appt in order to complete paperwork prior to first treatment. Pt approved for [facility] on TTS with 7:20 chair time. Pt's out-pt HD appts added to pt's AVS as well.  ? ?Melven Sartorius ?Renal Navigator ?[phone] ?

## 2021-08-09 NOTE — Progress Notes (Signed)
Physical Therapy Treatment ?Patient Details ?Name: Melvin Mcclure ?MRN: [id number] ?DOB: [birth year] ?Today's Date: 08/09/2021 ? ? ?History of Present Illness Pt is 71 y.o. male admitted 07/22/21 with worsening SOB; workup for acute on chronic respiratory failure with recurrent pleural effusion initially requiring bipap. S/p thoracentesis 3/21. S/p RLL bronchoscopy, bronchoalveolar lavage, transbronchial lung biopsy 3/22. Course complicated by AKI on CKD; iHD initiated 3/23. Pt also with acute methabolic encephalopathy; question ICU delirium vs uremia. PMH includes CKD 5 (undergoing transplant evaluation), HFpEF, DM2. ?  ?PT Comments  ? ? Pt progressing with mobility. Pt demonstrates improving stability with transfers and ambulation, no DME; main limiting factor is impaired cognition, including decreased awareness, difficulty problem solving, poor attention. Per chart, insurance declined appeal for CIR admission; plan for d/c to SNF to maximize functional mobility and independence before return home. If pt to return home, would need increased supervision for safety. Will continue to follow acutely to address established goals. ?   ?Recommendations for follow up therapy are one component of a multi-disciplinary discharge planning process, led by the attending physician.  Recommendations may be updated based on patient status, additional functional criteria and insurance authorization. ? ?Follow Up Recommendations ? Skilled nursing-short term rehab (<3 hours/day) (AIR declined) ?  ?  ?Assistance Recommended at Discharge Intermittent Supervision/Assistance  ?Patient can return home with the following A little help with bathing/dressing/bathroom;Assistance with cooking/housework;Direct supervision/assist for medications management;Direct supervision/assist for financial management;Assist for transportation ?  ?Equipment Recommendations ? None recommended by PT  ?  ?Recommendations for Other Services   ? ? ?  ?Precautions /  Restrictions Precautions ?Precautions: Fall ?Restrictions ?Weight Bearing Restrictions: No  ?  ? ?Mobility ? Bed Mobility ?Overal bed mobility: Independent ?Bed Mobility: Supine to Sit, Sit to Supine ?  ?  ?  ?  ?  ?  ?  ? ?Transfers ?Overall transfer level: Needs assistance ?Equipment used: None ?Transfers: Sit to/from Stand ?Sit to Stand: Supervision ?  ?  ?  ?  ?  ?General transfer comment: multiple sit<>stands from bed, recliner and chair without DME, supervision for safety ?  ? ?Ambulation/Gait ?Ambulation/Gait assistance: Supervision, Min guard ?Gait Distance (Feet): 120 Feet ?Assistive device: None ?Gait Pattern/deviations: Step-through pattern, Decreased stride length, Trunk flexed ?Gait velocity: Decreased ?  ?  ?General Gait Details: Slow, mostly steady gait without DME, 1x self-corrected instability with min guard; unprompted seated rest at front desk, difficult to redirect pt back to current task at hand; pt able to navigate himself back to room ? ? ?Stairs ?  ?  ?  ?  ?  ? ? ?Wheelchair Mobility ?  ? ?Modified Rankin (Stroke Patients Only) ?  ? ? ?  ?Balance Overall balance assessment: Needs assistance ?Sitting-balance support: Feet supported ?Sitting balance-Leahy Scale: Good ?  ?  ?Standing balance support: No upper extremity supported, Reliant on assistive device for balance ?Standing balance-Leahy Scale: Fair ?Standing balance comment: ambulatory without DME, unable to accept significant challenge ?  ?  ?  ?  ?  ?  ?  ?  ?Standardized Balance Assessment ?Standardized Balance Assessment :  (pt unable to consistently follow commands for DGI) ?  ?  ?  ? ?  ?Cognition Arousal/Alertness: Awake/alert ?Behavior During Therapy: Flat affect ?Overall Cognitive Status: Impaired/Different from baseline ?Area of Impairment: Orientation, Attention, Memory, Following commands, Safety/judgement, Awareness, Problem solving ?  ?  ?  ?  ?  ?  ?  ?  ?Orientation Level: Disoriented to, Situation ?Current Attention  Level:  Sustained, Selective ?Memory: Decreased recall of precautions, Decreased short-term memory ?Following Commands: Follows one step commands with increased time, Follows multi-step commands inconsistently ?Safety/Judgement: Decreased awareness of safety, Decreased awareness of deficits ?Awareness: Emergent ?Problem Solving: Requires verbal cues ?General Comments: pt unable to sequencing how to dial phone; unable to follow multistep commands; focused on wanting to find phone, unable to problem solve ways to do this (i.e. calling his cell phone number from room phone), pt giving wife's number instead of his own to try calling; difficult to redirect ?  ?  ? ?  ?Exercises   ? ?  ?General Comments General comments (skin integrity, edema, etc.): pt discussing his need for rehab before home, "but I won't need to stay there long" ?  ?  ? ?Pertinent Vitals/Pain Pain Assessment ?Pain Assessment: No/denies pain ?Pain Intervention(s): Monitored during session  ? ? ?Home Living   ?  ?  ?  ?  ?  ?  ?  ?  ?  ?   ?  ?Prior Function    ?  ?  ?   ? ?PT Goals (current goals can now be found in the care plan section) Acute Rehab PT Goals ?Patient Stated Goal: rehab before home ?PT Goal Formulation: With patient/family ?Time For Goal Achievement: 08/24/21 ?Potential to Achieve Goals: Fair ?Progress towards PT goals: Progressing toward goals ? ?  ?Frequency ? ? ? Min 2X/week ? ? ? ?  ?PT Plan Frequency needs to be updated  ? ? ?Co-evaluation   ?  ?  ?  ?  ? ?  ?AM-PAC PT "6 Clicks" Mobility   ?Outcome Measure ? Help needed turning from your back to your side while in a flat bed without using bedrails?: None ?Help needed moving from lying on your back to sitting on the side of a flat bed without using bedrails?: None ?Help needed moving to and from a bed to a chair (including a wheelchair)?: A Little ?Help needed standing up from a chair using your arms (e.g., wheelchair or bedside chair)?: A Little ?Help needed to walk in hospital  room?: A Little ?Help needed climbing 3-5 steps with a railing? : A Little ?6 Click Score: 20 ? ?  ?End of Session   ?Activity Tolerance: Patient tolerated treatment well ?Patient left: in chair;with call bell/phone within reach;with nursing/sitter in room (at nursing station, RN aware) ?Nurse Communication: Mobility status;Other (comment) (RN ok with pt staying at nurses' station) ?PT Visit Diagnosis: Unsteadiness on feet (R26.81);Muscle weakness (generalized) (M62.81) ?  ? ? ?Time: 0630-1601 ?PT Time Calculation (min) (ACUTE ONLY): 21 min ? ?Charges:  $Therapeutic Activity: 8-22 mins          ?          ? ?Mabeline Caras, PT, DPT ?Acute Rehabilitation Services  ?Pager [phone] ?Office [phone] ? ?Derry Lory ?08/09/2021, 1:04 PM ? ?

## 2021-08-10 DIAGNOSIS — J9621 Acute and chronic respiratory failure with hypoxia: Secondary | ICD-10-CM | POA: Diagnosis not present

## 2021-08-10 LAB — GLUCOSE, CAPILLARY
Glucose-Capillary: 195 mg/dL — ABNORMAL HIGH (ref 70–99)
Glucose-Capillary: 140 mg/dL — ABNORMAL HIGH (ref 70–99)
Glucose-Capillary: 121 mg/dL — ABNORMAL HIGH (ref 70–99)
Glucose-Capillary: 184 mg/dL — ABNORMAL HIGH (ref 70–99)

## 2021-08-10 MED ORDER — INSULIN GLARGINE-YFGN 100 UNIT/ML ~~LOC~~ SOLN
20.0000 [IU] | Freq: Every day | SUBCUTANEOUS | Status: DC
  Administered 2021-08-11 – 2021-08-12 (×2): 20 [IU] via SUBCUTANEOUS
  Filled 2021-08-10 (×2): qty 0.2

## 2021-08-10 NOTE — Progress Notes (Signed)
Subjective:   NAD, lying in bed.  For HD today ? ?Objective ?Vital signs in last 24 hours: ?Vitals:  ? 08/09/21 0824 08/09/21 1606 08/09/21 2225 08/10/21 0722  ?BP: 135/71 136/70 130/60 134/77  ?Pulse: 88 83 80 82  ?Resp: '16 16 17 18  '$ ?Temp: 98.5 ?F (36.9 ?C) 98 ?F (36.7 ?C) 98.2 ?F (36.8 ?C) 98.1 ?F (36.7 ?C)  ?TempSrc: Oral Oral Oral Oral  ?SpO2: 100% 100% 95% 96%  ?Weight:      ?Height:      ? ?Weight change:  ? ?Intake/Output Summary (Last 24 hours) at 08/10/2021 1156 ?Last data filed at 08/10/2021 0600 ?Gross per 24 hour  ?Intake 240 ml  ?Output --  ?Net 240 ml  ? ? ? ? ?Assessment/ Plan: ?Pt is a 71 y.o. yo male with advanced CKD at baseline who was admitted on 07/22/2021 with  respiratory failure--  dialysis has been initiated this hospitalization for progression to ESRD ? ?Assessment/Plan: ?1. Renal-  dialysis has been initiated this hospitalization for ESRD-  have been able to use his AVF- first treatment 3/23-  going to be TTS at [facility].   ?- to get him on TTS OP schedule, Mon/Thurs/Sat this week.  HD 08/08/21 and next rx 08/10/21 ?2. HTN/volume-  volume status much better, on hydral/ imdur ?3. MS-  has only been very slow to improve but is improving ?4. Anemia-  hgb has been great-  has not required any treatment  ?5. Secondary hyperparathyroidism-  on phoslo-  last phos 5.7-  also on calcitriol daily oral -  Pth pending ?6. Dispo- deconditioned-  tried for rehab-  did not get approved, appeal made.  Looking at SNF now? ? ? ?Taym Twist  ? ? ?Labs: ?Basic Metabolic Panel: ?Recent Labs  ?Lab 08/05/21 ?0631 08/08/21 ?5852  ?NA 131* 130*  ?K 3.8 3.4*  ?CL 91* 96*  ?CO2 26 24  ?GLUCOSE 225* 255*  ?BUN 82* 50*  ?CREATININE 8.16* 4.37*  ?CALCIUM 8.8* 7.9*  ?PHOS 3.8  --   ? ?Liver Function Tests: ?Recent Labs  ?Lab 08/05/21 ?0631  ?ALBUMIN 3.1*  ? ?No results for input(s): LIPASE, AMYLASE in the last 168 hours. ?No results for input(s): AMMONIA in the last 168 hours. ?CBC: ?Recent Labs  ?Lab 08/05/21 ?0631  08/08/21 ?7782  ?WBC 11.4* 9.8  ?HGB 13.0 11.0*  ?HCT 39.9 34.0*  ?MCV 84.7 85.9  ?PLT 266 245  ? ?Cardiac Enzymes: ?No results for input(s): CKTOTAL, CKMB, CKMBINDEX, TROPONINI in the last 168 hours. ?CBG: ?Recent Labs  ?Lab 08/08/21 ?2059 08/09/21 ?0820 08/09/21 ?1202 08/09/21 ?1617 08/09/21 ?2223  ?GLUCAP 114* 131* 120* 145* 169*  ? ? ?Iron Studies: No results for input(s): IRON, TIBC, TRANSFERRIN, FERRITIN in the last 72 hours. ?Studies/Results: ?No results found. ?Medications: ?Infusions: ? sodium chloride    ? sodium chloride    ? ? ?Scheduled Medications: ? aspirin  81 mg Oral Daily  ? calcitRIOL  0.25 mcg Oral QODAY  ? calcium acetate  1,334 mg Oral TID WC  ? calcium carbonate  2 tablet Oral QHS  ? Chlorhexidine Gluconate Cloth  6 each Topical Q0600  ? Chlorhexidine Gluconate Cloth  6 each Topical Q0600  ? docusate sodium  100 mg Oral BID  ? feeding supplement  237 mL Oral BID BM  ? heparin  5,000 Units Subcutaneous Q8H  ? hydrALAZINE  25 mg Oral TID  ? insulin aspart  0-6 Units Subcutaneous TID WC  ? insulin glargine-yfgn  18 Units  Subcutaneous Daily  ? isosorbide mononitrate  60 mg Oral QPM  ? ketotifen  1 drop Both Eyes BID  ? kidney failure book   Does not apply Once  ? meclizine  25 mg Oral BID  ? mouth rinse  15 mL Mouth Rinse BID  ? multivitamin  1 tablet Oral QHS  ? polyethylene glycol  17 g Oral Daily  ? senna-docusate  1 tablet Oral QHS  ? sodium chloride flush  3 mL Intravenous Q12H  ? ? have reviewed scheduled and prn medications. ? ?Physical Exam: ?General:  NAD, sitting on edge of bed ?Heart: RRR ?Lungs: clear bilaterally ?Abdomen: soft, non tender ?Extremities: no edema ?Dialysis Access: left upper AVF-  good thrill and bruit   ? ? ?08/10/2021,11:56 AM ? LOS: 18 days  ? ?  ? ? ? ? ?

## 2021-08-10 NOTE — TOC Progression Note (Signed)
Transition of Care (TOC) - Progression Note  ? ? ?Patient Details  ?Name: Tradarius Reinwald ?MRN: [id number] ?Date of Birth: 1950/11/14 ? ?Transition of Care (TOC) CM/SW Contact  ?Jal, LCSWA ?Phone Number: ?08/10/2021, 12:24 PM ? ?Clinical Narrative:    ? ?Per admissions at [facility], pt can come on Monday due to pt still not starting dialysis today and admissions team not being at [facility] later this afternoon/evening. MD made aware.  ? ?Expected Discharge Plan: Veteran ?Barriers to Discharge: Other (must enter comment), SNF Pending bed offer (auth denied by insurance for CIR, under appeal) ? ?Expected Discharge Plan and Services ?Expected Discharge Plan: [place] ?In-house Referral: Clinical Social Work ?  ?Post Acute Care Choice: IP Rehab ?Living arrangements for the past 2 months: [place] ?                ?  ?  ?  ?  ?  ?  ?  ?  ?  ?  ? ? ?Social Determinants of Health (SDOH) Interventions ?  ? ?Readmission Risk Interventions ? ?  04/20/2020  ?  7:52 PM  ?Readmission Risk Prevention Plan  ?Transportation Screening Complete  ?Medication Review Press photographer) Complete  ?PCP or Specialist appointment within 3-5 days of discharge Complete  ?[place] or Home Care Consult Complete  ?SW Recovery Care/Counseling Consult Complete  ?Palliative Care Screening Not Applicable  ?[place] Not Applicable  ? ? ?

## 2021-08-10 NOTE — Progress Notes (Signed)
?PROGRESS NOTE ? ?Melvin Mcclure  ?DOB: [birth year]  ?PCP: Nolene Ebbs, MD ?OZD:[id number]  ?DOA: 07/22/2021 ? LOS: 18 days  ?Hospital Day: 20 ? ?Brief narrative: ?Melvin Mcclure is a 71 y.o. male with PMH significant for DM 2, HTN, HLD, chronic diastolic CHF, history of recurrent pleural effusion on 2 L oxygen at home, CKD 5, chronic anemia, iron deficiency anemia, peripheral neuropathy, chronic foot ulcer, arthritis, anxiety.  Patient was undergoing evaluation for renal transplant.  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  ?Patient presented to ED on 3/20 for shortness of breath.   ?In the ED, O2 sat 75% room air required 4 L oxygen. ?Imaging showed right pleural effusion, labs showed worsening renal function ?Admitted to hospitalist service ?Pulmonology and nephrology were consulted. ? ?3/21, underwent thoracentesis with removal of 1500 mill of fluid ?3/22, underwent flexible bronchoscopy with brushing and bronchial rule out level as an transbronchial lung biopsy for nonresolving obesity recurrent pleural effusion ?His oxygen requirement worsened and required BiPAP ?3/23, he was transferred to ICU on BiPAP.  He subsequently improved to high flow nasal cannula.  Nighttime BiPAP was recommended but patient refused. ?3/23, patient was started on dialysis 3 days fistula. ?3/29, transferred back to [facility]  ? ?Subjective: ?Patient was seen and examined this morning. ?Sitting at the edge of the bed.  Not in distress.  Unable to discharge this weekend because of scheduling conflict between dialysis and SNF staffing ? ?Principal Problem: ?  Acute on chronic respiratory failure with hypoxia ([place]) ?Active Problems: ?  Recurrent pleural effusion on right ?  Acute metabolic encephalopathy ?  Stage 5 chronic kidney disease ([place]) ?  Chronic diastolic CHF (congestive heart failure) ([place]) ?  DM2 (diabetes mellitus, type 2) ([place]) ?  HTN (hypertension) ?  Strongyloidiasis ?   ? ?Assessment and Plan: ?Acute on chronic respiratory failure with hypoxia ([place]) ?Recurrent pleural effusion ?Chronic bibasilar infiltrate ?-Seen by pulmonary.  Underwent thoracentesis.  Underwent bronchoscopy and brushing ?-BAL positive for Prevotella.  Completed course of Augmentin. ?-Respiratory status stable.  Breathing on room air. ?-WBC count improving, 9.8 on last check yesterday. ?Recent Labs  ?Lab 08/05/21 ?0631 08/08/21 ?2595  ?WBC 11.4* 9.8  ? ?Acute metabolic encephalopathy ?-Likely ICU delirium component to confusion/lethargy vs uremia ?-Mental status gradually improving. ? ?New ESRD ?-Nephrology consult was obtained.  Initially tried on high-dose Lasix without improvement.  Patient was subsequently started on hemodialysis. ?Currently being dialyzed on a Monday Wednesday Friday schedule.  He is being transitioned to a Tuesday Thursday Saturday schedule which is what is being planned in the outpatient setting.   ? ?Chronic diastolic CHF ?Essential hypertension ?-Volume being managed with hemodialysis.   ?-2D echocardiogram done 07/23/2021 shows preserved LV function with LV ejection fraction of 55 to 60% with no regional wall motion abnormality and grade 2 diastolic dysfunction. ?-Blood pressure drops during dialysis intermittently. ?-Currently on hydralazine 25 mg 3 times daily, Imdur 60 mg daily.  Off amlodipine and Coreg.  Torsemide, metolazone stopped. ? ?Type 2 diabetes mellitus ?-A1c 7 on 07/22/2021 ?-Home meds includ Lantus 20 units in the morning ?-Currently on Semglee 18 units daily, sliding scale insulin. ?-Blood sugar level still remains elevated.  I increased it to 20 units as he was using at home. ?Recent Labs  ?Lab 08/09/21 ?0820 08/09/21 ?1202 08/09/21 ?1617 08/09/21 ?2223 08/10/21 ?1158  ?GLUCAP 131* 120* 145* 169* 140*  ? ?Strongyloidiasis ?-Should have been effectively treated with Ivermectin x 2 rounds ?-  F/u with ID and/or transplant as needed ? ?Goals of care ?  Code Status: Full Code   ? ? ?Mobility: PT eval obtained. ? ?Nutritional status:  ?Body mass index is 27.74 kg/m?Marland Kitchen  ?Nutrition Problem: Increased nutrient needs ?Etiology: acute illness ?Signs/Symptoms: estimated needs ? ? ? ? ?Diet:  ?Diet Order   ? ?       ?  Diet 2 gram sodium Room service appropriate? Yes; Fluid consistency: Thin; Fluid restriction: 1200 mL Fluid  Diet effective now       ?  ? ?  ?  ? ?  ? ? ?DVT prophylaxis:  ?heparin injection 5,000 Units Start: 07/22/21 1400 ?  ?Antimicrobials: Completed a course of antibiotics ?Fluid: None ?Consultants: Nephrology ?Family Communication: None at bedside ? ?Status is: Inpatient ? ?Continue in-hospital care because: SNF approval received but unable to discharge.  Patient needs dialysis this afternoon and SNF will be able to take tomorrow.  Likely discharge on Monday ?Level of care: Med-Surg  ? ?             Patient currently is medically stable to d/c. ?  Difficult to place patient No ? ? ? ? ?Infusions:  ? sodium chloride    ? sodium chloride    ? ? ?Scheduled Meds: ? aspirin  81 mg Oral Daily  ? calcitRIOL  0.25 mcg Oral QODAY  ? calcium acetate  1,334 mg Oral TID WC  ? calcium carbonate  2 tablet Oral QHS  ? Chlorhexidine Gluconate Cloth  6 each Topical Q0600  ? Chlorhexidine Gluconate Cloth  6 each Topical Q0600  ? docusate sodium  100 mg Oral BID  ? feeding supplement  237 mL Oral BID BM  ? heparin  5,000 Units Subcutaneous Q8H  ? hydrALAZINE  25 mg Oral TID  ? insulin aspart  0-6 Units Subcutaneous TID WC  ? [START ON 08/11/2021] insulin glargine-yfgn  20 Units Subcutaneous Daily  ? isosorbide mononitrate  60 mg Oral QPM  ? ketotifen  1 drop Both Eyes BID  ? kidney failure book   Does not apply Once  ? meclizine  25 mg Oral BID  ? mouth rinse  15 mL Mouth Rinse BID  ? multivitamin  1 tablet Oral QHS  ? polyethylene glycol  17 g Oral Daily  ? senna-docusate  1 tablet Oral QHS  ? sodium chloride flush  3 mL Intravenous Q12H  ? ? ?PRN meds: ?sodium chloride, sodium chloride,  acetaminophen **OR** acetaminophen, albuterol, calcium carbonate (dosed in mg elemental calcium), docusate sodium, haloperidol lactate, heparin, hydrALAZINE, HYDROcodone-acetaminophen, lidocaine (PF), lidocaine-prilocaine, ondansetron **OR** ondansetron (ZOFRAN) IV, pentafluoroprop-tetrafluoroeth, phenol, senna-docusate, sorbitol, zolpidem  ? ?Antimicrobials: ?Anti-infectives (From admission, onward)  ? ? Start     Dose/Rate Route Frequency Ordered Stop  ? 07/29/21 1200  amoxicillin-clavulanate (AUGMENTIN) 500-125 MG per tablet 500 mg       ? 1 tablet Oral Daily 07/29/21 0914 08/04/21 2021  ? ?  ? ? ?Objective: ?Vitals:  ? 08/09/21 2225 08/10/21 0722  ?BP: 130/60 134/77  ?Pulse: 80 82  ?Resp: 17 18  ?Temp: 98.2 ?F (36.8 ?C) 98.1 ?F (36.7 ?C)  ?SpO2: 95% 96%  ? ? ?Intake/Output Summary (Last 24 hours) at 08/10/2021 1555 ?Last data filed at 08/10/2021 0600 ?Gross per 24 hour  ?Intake 240 ml  ?Output --  ?Net 240 ml  ? ? ?Filed Weights  ? 08/06/21 0500 08/08/21 0700 08/09/21 0612  ?Weight: 88.2 kg 88.2 kg 87.7 kg  ? ?Weight  change:  ?Body mass index is 27.74 kg/m?.  ? ?Physical Exam: ?General exam: Pleasant elderly African-American male.  Not in physical distress ?Skin: No rashes, lesions or ulcers. ?HEENT: Atraumatic, normocephalic, no obvious bleeding ?Lungs: Clear to auscultation bilaterally ?CVS: Regular rate and rhythm, no murmur ?GI/Abd soft, nontender, nondistended, bowel sounds present ?CNS: Alert, awake, oriented x3 ?Psychiatry: Mood appropriate ?Extremities: No pedal edema, no calf tenderness ? ?Data Review: I have personally reviewed the laboratory data and studies available. ? ?F/u labs ordered ?Unresulted Labs (From admission, onward)  ? ?  Start     Ordered  ? 07/24/21 1012  Acid Fast Culture with reflexed sensitivities  RELEASE UPON ORDERING,   TIMED       ?Comments: Specimen B: Phone [phone]         ?Previous Biopsy:  no ?Is the patient on airborne/droplet precautions? No           ?Clinical History:   N/A ?Copy of Report to:  N/A ?Specimen Disposition: OR Specimen Holding ? ?  ? 07/24/21 1012  ? Signed and Held  Renal function panel  Once,   R       ?Question:  Specimen collection method  Answer:  Lab=Lab collect

## 2021-08-10 NOTE — Plan of Care (Signed)
?  Problem: Activity: ?Goal: Capacity to carry out activities will improve ?Outcome: Progressing ?  ?Problem: Pain Managment: ?Goal: General experience of comfort will improve ?Outcome: Progressing ?  ?Problem: Safety: ?Goal: Ability to remain free from injury will improve ?Outcome: Progressing ?  ?

## 2021-08-10 NOTE — TOC Progression Note (Addendum)
Transition of Care (TOC) - Progression Note  ? ? ?Patient Details  ?Name: Melvin Mcclure ?MRN: [id number] ?Date of Birth: 30-Jan-1951 ? ?Transition of Care (TOC) CM/SW Contact  ?[place], LCSWA ?Phone Number: ?08/10/2021, 11:21 AM ? ?Clinical Narrative:    ? ?Per RN: MD states pt will dc tomorrow, CSW reached out to [place] at [facility] to update her, she will let CSW know if the admissions team will be there since tomorrow is Easter.  ? ?Expected Discharge Plan: [place] ?Barriers to Discharge: Other (must enter comment), SNF Pending bed offer (auth denied by insurance for CIR, under appeal) ? ?Expected Discharge Plan and Services ?Expected Discharge Plan: [place] ?In-house Referral: Clinical Social Work ?  ?Post Acute Care Choice: IP Rehab ?Living arrangements for the past 2 months: [place] ?                ?  ?  ?  ?  ?  ?  ?  ?  ?  ?  ? ? ?Social Determinants of Health (SDOH) Interventions ?  ? ?Readmission Risk Interventions ? ?  04/20/2020  ?  7:52 PM  ?Readmission Risk Prevention Plan  ?Transportation Screening Complete  ?Medication Review Press photographer) Complete  ?PCP or Specialist appointment within 3-5 days of discharge Complete  ?[place] or Home Care Consult Complete  ?SW Recovery Care/Counseling Consult Complete  ?Palliative Care Screening Not Applicable  ?[place] Not Applicable  ? ? ?

## 2021-08-11 DIAGNOSIS — J9621 Acute and chronic respiratory failure with hypoxia: Secondary | ICD-10-CM | POA: Diagnosis not present

## 2021-08-11 LAB — RENAL FUNCTION PANEL
Albumin: 2.7 g/dL — ABNORMAL LOW (ref 3.5–5.0)
Anion gap: 7 (ref 5–15)
BUN: 48 mg/dL — ABNORMAL HIGH (ref 8–23)
CO2: 26 mmol/L (ref 22–32)
Calcium: 8 mg/dL — ABNORMAL LOW (ref 8.9–10.3)
Chloride: 104 mmol/L (ref 98–111)
Creatinine, Ser: 3.62 mg/dL — ABNORMAL HIGH (ref 0.61–1.24)
GFR, Estimated: 17 mL/min — ABNORMAL LOW (ref >60)
Glucose, Bld: 170 mg/dL — ABNORMAL HIGH (ref 70–99)
Phosphorus: 2.3 mg/dL — ABNORMAL LOW (ref 2.5–4.6)
Potassium: 4.7 mmol/L (ref 3.5–5.1)
Sodium: 137 mmol/L (ref 135–145)

## 2021-08-11 LAB — CBC
HCT: 33.7 % — ABNORMAL LOW (ref 39.0–52.0)
Hemoglobin: 10.6 g/dL — ABNORMAL LOW (ref 13.0–17.0)
MCH: 27.7 pg (ref 26.0–34.0)
MCHC: 31.5 g/dL (ref 30.0–36.0)
MCV: 88 fL (ref 80.0–100.0)
Platelets: 240 10*3/uL (ref 150–400)
RBC: 3.83 MIL/uL — ABNORMAL LOW (ref 4.22–5.81)
RDW: 15.6 % — ABNORMAL HIGH (ref 11.5–15.5)
WBC: 7.6 10*3/uL (ref 4.0–10.5)
nRBC: 0 % (ref 0.0–0.2)

## 2021-08-11 LAB — GLUCOSE, CAPILLARY
Glucose-Capillary: 224 mg/dL — ABNORMAL HIGH (ref 70–99)
Glucose-Capillary: 72 mg/dL (ref 70–99)
Glucose-Capillary: 75 mg/dL (ref 70–99)

## 2021-08-11 MED ORDER — ALTEPLASE 2 MG IJ SOLR
2.0000 mg | Freq: Once | INTRAMUSCULAR | Status: DC | PRN
  Filled 2021-08-11: qty 2

## 2021-08-11 MED ORDER — RENA-VITE PO TABS: 1.0000 | ORAL_TABLET | Freq: Every day | ORAL | 0 refills | Status: DC

## 2021-08-11 MED ORDER — DOCUSATE SODIUM 100 MG PO CAPS: 100.0000 mg | ORAL_CAPSULE | Freq: Two times a day (BID) | ORAL | 0 refills | Status: DC

## 2021-08-11 MED ORDER — POLYETHYLENE GLYCOL 3350 17 G PO PACK: 17.0000 g | PACK | Freq: Every day | ORAL | 0 refills | Status: AC

## 2021-08-11 MED ORDER — HYDRALAZINE HCL 25 MG PO TABS
25.0000 mg | ORAL_TABLET | Freq: Three times a day (TID) | ORAL | Status: DC
Start: 2021-08-11 — End: 2021-10-18

## 2021-08-11 MED ORDER — ENSURE ENLIVE PO LIQD
237.0000 mL | Freq: Two times a day (BID) | ORAL | 12 refills | Status: DC
Start: 2021-08-11 — End: 2023-01-02

## 2021-08-11 NOTE — Progress Notes (Signed)
?PROGRESS NOTE ? ?Melvin Mcclure  ?DOB: 1951/03/03  ?PCP: Nolene Ebbs, MD ?OZD:[id number]  ?DOA: 07/22/2021 ? LOS: 19 days  ?Hospital Day: 21 ? ?Brief narrative: ?Melvin Mcclure is a 71 y.o. male with PMH significant for DM 2, HTN, HLD, chronic diastolic CHF, history of recurrent pleural effusion on 2 L oxygen at home, CKD 5, chronic anemia, iron deficiency anemia, peripheral neuropathy, chronic foot ulcer, arthritis, anxiety.  Patient was undergoing evaluation for renal transplant.  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  ?Patient presented to ED on 3/20 for shortness of breath.   ?In the ED, O2 sat 75% room air required 4 L oxygen. ?Imaging showed right pleural effusion, labs showed worsening renal function ?Admitted to hospitalist service ?Pulmonology and nephrology were consulted. ? ?3/21, underwent thoracentesis with removal of 1500 mill of fluid ?3/22, underwent flexible bronchoscopy with brushing and bronchial rule out level as an transbronchial lung biopsy for nonresolving obesity recurrent pleural effusion ?His oxygen requirement worsened and required BiPAP ?3/23, he was transferred to ICU on BiPAP.  He subsequently improved to high flow nasal cannula.  Nighttime BiPAP was recommended but patient refused. ?3/23, patient was started on dialysis 3 days fistula. ?3/29, transferred back to [facility]  ? ?Subjective: ?Patient was seen and examined this morning.  ?Sitting at the edge of the bed.  Not in distress.  Unable to discharge this weekend because of scheduling conflict between dialysis and SNF staffing ?Wife at bedside ? ?Principal Problem: ?  Acute on chronic respiratory failure with hypoxia (Melvin Mcclure) ?Active Problems: ?  Recurrent pleural effusion on right ?  Acute metabolic encephalopathy ?  Stage 5 chronic kidney disease (Melvin Mcclure) ?  Chronic diastolic CHF (congestive heart failure) (Melvin Mcclure) ?  DM2 (diabetes mellitus, type 2) (Melvin Mcclure) ?  HTN (hypertension) ?   Strongyloidiasis ?  ? ?Assessment and Plan: ?Acute on chronic respiratory failure with hypoxia ([place]) ?Recurrent pleural effusion ?Chronic bibasilar infiltrate ?-Seen by pulmonary.  Underwent thoracentesis.  Underwent bronchoscopy and brushing ?-BAL positive for Prevotella.  Completed course of Augmentin. ?-Respiratory status stable.  Breathing on room air. ?-WBC count improving, 9.8 on last check on 4/6. ?Recent Labs  ?Lab 08/05/21 ?0631 08/08/21 ?2595  ?WBC 11.4* 9.8  ? ?Acute metabolic encephalopathy ?-Likely ICU delirium component to confusion/lethargy vs uremia ?-Mental status gradually improving. ? ?New ESRD ?-Patient presented with worsening renal function and volume overload  Nephrology consult was obtained.  Initially tried on high-dose Lasix without improvement.  Patient was subsequently started on hemodialysis. ?Currently being dialyzed on a Monday Wednesday Friday schedule.  He is being transitioned to a Tuesday Thursday Saturday schedule which is what is being planned in the outpatient setting.   ? ?Chronic diastolic CHF ?Essential hypertension ?-Volume being managed with hemodialysis.   ?-2D echocardiogram done 07/23/2021 shows preserved LV function with LV ejection fraction of 55 to 60% with no regional wall motion abnormality and grade 2 diastolic dysfunction. ?-Blood pressure drops during dialysis intermittently. ?-Currently on hydralazine 25 mg 3 times daily, Imdur 60 mg daily.  Off amlodipine, Coreg, Torsemide, metolazone. ? ?Type 2 diabetes mellitus ?-A1c 7 on 07/22/2021 ?-Home meds include Lantus 20 units in the morning ?-Currently on Semglee 20 units units daily, sliding scale insulin. ?-Blood sugar trend as below.  This morning, there is an error entry blood sugar level <10. ?Recent Labs  ?Lab 08/10/21 ?1158 08/10/21 ?1624 08/10/21 ?2109 08/11/21 ?6387 08/11/21 ?5643  ?GLUCAP 140* 121* 184* <10* 224*  ? ?  Strongyloidiasis ?-Should have been effectively treated with Ivermectin x 2 rounds ?-F/u with  ID and/or transplant as needed ? ?Goals of care ?  Code Status: Full Code  ? ? ?Mobility: PT eval obtained. ? ?Nutritional status:  ?Body mass index is 27.74 kg/m?Melvin Mcclure  ?Nutrition Problem: Increased nutrient needs ?Etiology: acute illness ?Signs/Symptoms: estimated needs ? ? ? ? ?Diet:  ?Diet Order   ? ?       ?  Diet 2 gram sodium Room service appropriate? Yes; Fluid consistency: Thin; Fluid restriction: 1200 mL Fluid  Diet effective now       ?  ? ?  ?  ? ?  ? ? ?DVT prophylaxis:  ?heparin injection 5,000 Units Start: 07/22/21 1400 ?  ?Antimicrobials: Completed a course of antibiotics ?Fluid: None ?Consultants: Nephrology ?Family Communication: None at bedside ? ?Status is: Inpatient ? ?Continue in-hospital care because: SNF approval received but unable to discharge.  Patient needs dialysis this afternoon and SNF will be able to take tomorrow.  Likely discharge on Monday ?Level of care: Med-Surg  ? ?             Patient currently is medically stable to d/c. ?  Difficult to place patient No ? ? ? ? ?Infusions:  ? sodium chloride    ? sodium chloride    ? ? ?Scheduled Meds: ? aspirin  81 mg Oral Daily  ? calcitRIOL  0.25 mcg Oral QODAY  ? calcium acetate  1,334 mg Oral TID WC  ? calcium carbonate  2 tablet Oral QHS  ? Chlorhexidine Gluconate Cloth  6 each Topical Q0600  ? Chlorhexidine Gluconate Cloth  6 each Topical Q0600  ? docusate sodium  100 mg Oral BID  ? feeding supplement  237 mL Oral BID BM  ? heparin  5,000 Units Subcutaneous Q8H  ? hydrALAZINE  25 mg Oral TID  ? insulin aspart  0-6 Units Subcutaneous TID WC  ? insulin glargine-yfgn  20 Units Subcutaneous Daily  ? isosorbide mononitrate  60 mg Oral QPM  ? ketotifen  1 drop Both Eyes BID  ? kidney failure book   Does not apply Once  ? meclizine  25 mg Oral BID  ? mouth rinse  15 mL Mouth Rinse BID  ? multivitamin  1 tablet Oral QHS  ? polyethylene glycol  17 g Oral Daily  ? senna-docusate  1 tablet Oral QHS  ? sodium chloride flush  3 mL Intravenous Q12H   ? ? ?PRN meds: ?sodium chloride, sodium chloride, acetaminophen **OR** acetaminophen, albuterol, alteplase, calcium carbonate (dosed in mg elemental calcium), docusate sodium, haloperidol lactate, heparin, hydrALAZINE, HYDROcodone-acetaminophen, lidocaine (PF), lidocaine-prilocaine, ondansetron **OR** ondansetron (ZOFRAN) IV, pentafluoroprop-tetrafluoroeth, phenol, senna-docusate, sorbitol, zolpidem  ? ?Antimicrobials: ?Anti-infectives (From admission, onward)  ? ? Start     Dose/Rate Route Frequency Ordered Stop  ? 07/29/21 1200  amoxicillin-clavulanate (AUGMENTIN) 500-125 MG per tablet 500 mg       ? 1 tablet Oral Daily 07/29/21 0914 08/04/21 2021  ? ?  ? ? ?Objective: ?Vitals:  ? 08/11/21 0438 08/11/21 0847  ?BP: (!) 169/78 (!) 165/70  ?Pulse: 79 86  ?Resp: 18 18  ?Temp: 98.1 ?F (36.7 ?C) 98.1 ?F (36.7 ?C)  ?SpO2: 98% 100%  ? ? ?Intake/Output Summary (Last 24 hours) at 08/11/2021 1409 ?Last data filed at 08/11/2021 1300 ?Gross per 24 hour  ?Intake 350 ml  ?Output --  ?Net 350 ml  ? ? ?Filed Weights  ? 08/06/21 0500 08/08/21 0700 08/09/21 0612  ?  Weight: 88.2 kg 88.2 kg 87.7 kg  ? ?Weight change:  ?Body mass index is 27.74 kg/m?.  ? ?Physical Exam: ?General exam: Pleasant elderly African-American male.  Not in physical distress ?Skin: No rashes, lesions or ulcers. ?HEENT: Atraumatic, normocephalic, no obvious bleeding ?Lungs: Clear to auscultation bilaterally ?CVS: Regular rate and rhythm, no murmur ?GI/Abd soft, nontender, nondistended, bowel sounds present ?CNS: Alert, awake, oriented x3 ?Psychiatry: Mood appropriate. ?Extremities: No pedal edema, no calf tenderness ? ?Data Review: I have personally reviewed the laboratory data and studies available. ? ?F/u labs ordered ?Unresulted Labs (From admission, onward)  ? ?  Start     Ordered  ? 08/12/21 0500  Renal function panel  Tomorrow morning,   R       ?Question:  Specimen collection method  Answer:  Lab=Lab collect  ? 08/11/21 0839  ? 08/12/21 0500  CBC  Tomorrow  morning,   R       ?Question:  Specimen collection method  Answer:  Lab=Lab collect  ? 08/11/21 0839  ? 08/11/21 1316  Renal function panel  Once,   R       ?Question:  Specimen collection method  Answer:  Lab=Lab

## 2021-08-11 NOTE — Progress Notes (Signed)
Subjective:   sitting in bed, NAD, wife at bedside.  HD bumped yesterday d/t schedule, for HD today ? ?Objective ?Vital signs in last 24 hours: ?Vitals:  ? 08/10/21 1622 08/10/21 2100 08/11/21 0438 08/11/21 0847  ?BP: (!) 163/84 (!) 160/77 (!) 169/78 (!) 165/70  ?Pulse: 79 84 79 86  ?Resp: '16 16 18 18  '$ ?Temp: 98.2 ?F (36.8 ?C) 97.9 ?F (36.6 ?C) 98.1 ?F (36.7 ?C) 98.1 ?F (36.7 ?C)  ?TempSrc: Oral Oral  Oral  ?SpO2: 96% 100% 98% 100%  ?Weight:      ?Height:      ? ?Weight change:  ?No intake or output data in the 24 hours ending 08/11/21 1130 ? ? ? ? ?Assessment/ Plan: ?Pt is a 71 y.o. yo male with advanced CKD at baseline who was admitted on 07/22/2021 with  respiratory failure--  dialysis has been initiated this hospitalization for progression to ESRD ? ?Assessment/Plan: ?1. Renal-  dialysis has been initiated this hospitalization for ESRD-  have been able to use his AVF- first treatment 3/23-  going to be TTS at [facility].   ?- to get him on TTS OP schedule, Mon/Thurs/Sat this week.  HD 08/08/21 and 08/10/21, next HD 08/13/21 ?2. HTN/volume-  volume status much better, on hydral/ imdur ?3. MS-  has only been very slow to improve but is improving ?4. Anemia-  hgb has been great-  has not required any treatment  ?5. Secondary hyperparathyroidism-  on phoslo-  last phos 5.7-  also on calcitriol daily oral  ?6. Dispo- deconditioned-  tried for rehab-  did not get approved, appeal made.  Looking at SNF now ? ? ?Abby Stines  ? ? ?Labs: ?Basic Metabolic Panel: ?Recent Labs  ?Lab 08/05/21 ?0631 08/08/21 ?1761  ?NA 131* 130*  ?K 3.8 3.4*  ?CL 91* 96*  ?CO2 26 24  ?GLUCOSE 225* 255*  ?BUN 82* 50*  ?CREATININE 8.16* 4.37*  ?CALCIUM 8.8* 7.9*  ?PHOS 3.8  --   ? ?Liver Function Tests: ?Recent Labs  ?Lab 08/05/21 ?0631  ?ALBUMIN 3.1*  ? ?No results for input(s): LIPASE, AMYLASE in the last 168 hours. ?No results for input(s): AMMONIA in the last 168 hours. ?CBC: ?Recent Labs  ?Lab 08/05/21 ?0631 08/08/21 ?6073  ?WBC 11.4* 9.8  ?HGB  13.0 11.0*  ?HCT 39.9 34.0*  ?MCV 84.7 85.9  ?PLT 266 245  ? ?Cardiac Enzymes: ?No results for input(s): CKTOTAL, CKMB, CKMBINDEX, TROPONINI in the last 168 hours. ?CBG: ?Recent Labs  ?Lab 08/10/21 ?1158 08/10/21 ?1624 08/10/21 ?2109 08/11/21 ?7106 08/11/21 ?2694  ?GLUCAP 140* 121* 184* <10* 224*  ? ? ?Iron Studies: No results for input(s): IRON, TIBC, TRANSFERRIN, FERRITIN in the last 72 hours. ?Studies/Results: ?No results found. ?Medications: ?Infusions: ? sodium chloride    ? sodium chloride    ? ? ?Scheduled Medications: ? aspirin  81 mg Oral Daily  ? calcitRIOL  0.25 mcg Oral QODAY  ? calcium acetate  1,334 mg Oral TID WC  ? calcium carbonate  2 tablet Oral QHS  ? Chlorhexidine Gluconate Cloth  6 each Topical Q0600  ? Chlorhexidine Gluconate Cloth  6 each Topical Q0600  ? docusate sodium  100 mg Oral BID  ? feeding supplement  237 mL Oral BID BM  ? heparin  5,000 Units Subcutaneous Q8H  ? hydrALAZINE  25 mg Oral TID  ? insulin aspart  0-6 Units Subcutaneous TID WC  ? insulin glargine-yfgn  20 Units Subcutaneous Daily  ? isosorbide mononitrate  60 mg  Oral QPM  ? ketotifen  1 drop Both Eyes BID  ? kidney failure book   Does not apply Once  ? meclizine  25 mg Oral BID  ? mouth rinse  15 mL Mouth Rinse BID  ? multivitamin  1 tablet Oral QHS  ? polyethylene glycol  17 g Oral Daily  ? senna-docusate  1 tablet Oral QHS  ? sodium chloride flush  3 mL Intravenous Q12H  ? ? have reviewed scheduled and prn medications. ? ?Physical Exam: ?General:  NAD, sitting on edge of bed ?Heart: RRR ?Lungs: clear bilaterally ?Abdomen: soft, non tender ?Extremities: no edema ?Dialysis Access: left upper AVF-  good thrill and bruit   ? ? ?08/11/2021,11:30 AM ? LOS: 19 days  ? ?  ? ? ? ? ?

## 2021-08-12 DIAGNOSIS — N186 End stage renal disease: Secondary | ICD-10-CM

## 2021-08-12 LAB — GLUCOSE, CAPILLARY
Glucose-Capillary: 170 mg/dL — ABNORMAL HIGH (ref 70–99)
Glucose-Capillary: 164 mg/dL — ABNORMAL HIGH (ref 70–99)

## 2021-08-12 LAB — CBC
HCT: 36.3 % — ABNORMAL LOW (ref 39.0–52.0)
Hemoglobin: 11.3 g/dL — ABNORMAL LOW (ref 13.0–17.0)
MCH: 27.3 pg (ref 26.0–34.0)
MCHC: 31.1 g/dL (ref 30.0–36.0)
MCV: 87.7 fL (ref 80.0–100.0)
Platelets: 224 10*3/uL (ref 150–400)
RBC: 4.14 MIL/uL — ABNORMAL LOW (ref 4.22–5.81)
RDW: 15.7 % — ABNORMAL HIGH (ref 11.5–15.5)
WBC: 8.9 10*3/uL (ref 4.0–10.5)
nRBC: 0 % (ref 0.0–0.2)

## 2021-08-12 LAB — RENAL FUNCTION PANEL
Albumin: 3 g/dL — ABNORMAL LOW (ref 3.5–5.0)
Anion gap: 6 (ref 5–15)
BUN: 27 mg/dL — ABNORMAL HIGH (ref 8–23)
CO2: 31 mmol/L (ref 22–32)
Calcium: 8.2 mg/dL — ABNORMAL LOW (ref 8.9–10.3)
Chloride: 99 mmol/L (ref 98–111)
Creatinine, Ser: 3.06 mg/dL — ABNORMAL HIGH (ref 0.61–1.24)
GFR, Estimated: 21 mL/min — ABNORMAL LOW (ref >60)
Glucose, Bld: 169 mg/dL — ABNORMAL HIGH (ref 70–99)
Phosphorus: 2.2 mg/dL — ABNORMAL LOW (ref 2.5–4.6)
Potassium: 4.3 mmol/L (ref 3.5–5.1)
Sodium: 136 mmol/L (ref 135–145)

## 2021-08-12 MED ORDER — TOUJEO MAX SOLOSTAR 300 UNIT/ML ~~LOC~~ SOPN: 18.0000 [IU] | PEN_INJECTOR | Freq: Every morning | SUBCUTANEOUS | Status: AC

## 2021-08-12 MED ORDER — INSULIN GLARGINE-YFGN 100 UNIT/ML ~~LOC~~ SOLN: 18.0000 [IU] | Freq: Every day | SUBCUTANEOUS | Status: DC

## 2021-08-12 MED ORDER — HYDROCODONE-ACETAMINOPHEN 5-325 MG PO TABS: 1.0000 | ORAL_TABLET | Freq: Two times a day (BID) | ORAL | 0 refills | Status: AC | PRN

## 2021-08-12 NOTE — TOC Transition Note (Signed)
Transition of Care (TOC) - CM/SW Discharge Note ? ? ?Patient Details  ?Name: Melvin Mcclure ?MRN: [id number] ?Date of Birth: [birth year] ? ?Transition of Care [facility]) CM/SW Contact:  ?Joanne Chars, LCSW ?Phone Number: ?08/12/2021, 10:04 AM ? ? ?Clinical Narrative:   Pt discharging to [facility].  RN call report to [phone].  ? ? ?0830: CSW attempted to contact Courtney Rorie at [facility], no answer, LM. ?0930: CSW spoke with pt and wife in room.  Updated on status of transportation. Wife is aware of need to be at [facility] 30 minutes early, at 16am tomorrow for paperwork and she is planning to transport.   ? ? ? ?Final next level of care: [place] ?Barriers to Discharge: Barriers Resolved ? ? ?Patient Goals and CMS Choice ?  ?CMS Medicare.gov Compare Post Acute Care list provided to:: Patient Represenative (must comment) ?Choice offered to / list presented to : Spouse ? ?Discharge Placement ?  ?           ?Patient chooses bed at: [facility] ?Patient to be transferred to facility by: [facility] ?Name of family member notified: wife [name] in room ?Patient and family notified of of transfer: 08/12/21 ? ?Discharge Plan and Services ?In-house Referral: Clinical Social Work ?  ?Post Acute Care Choice: IP Rehab          ?  ?  ?  ?  ?  ?  ?  ?  ?  ?  ? ?Social Determinants of Health (SDOH) Interventions ?  ? ? ?Readmission Risk Interventions ? ?  04/20/2020  ?  7:52 PM  ?Readmission Risk Prevention Plan  ?Transportation Screening Complete  ?Medication Review [employer]) Complete  ?PCP or Specialist appointment within 3-5 days of discharge Complete  ?[place] or Home Care Consult Complete  ?SW Recovery Care/Counseling Consult Complete  ?Palliative Care Screening Not Applicable  ?[place] Not Applicable  ? ? ? ? ? ?

## 2021-08-12 NOTE — Progress Notes (Signed)
Pt d/c to snf today. Spoke to [place] at [facility] to make clinic aware that pt will start tomorrow. Contacted renal PA to make aware of clinic's need for orders for start of care for tomorrow. Wife advised on Friday that pt needs to arrive at 6:30 for first treatment. Wife reminded of this info by CSW prior to d/c.  ? ?Melven Sartorius ?Renal Navigator ?[phone] ?

## 2021-08-12 NOTE — Discharge Summary (Signed)
?[facility] ? ?Physician Discharge Summary  ? ?Patient ID: ?Melvin Mcclure ?MRN: [id number] ?DOB/AGE: 06-06-1950 71 y.o. ? ?Admit date: 07/22/2021 ?Discharge date:   08/12/2021 ? ? ?PCP: Nolene Ebbs, MD ? ?DISCHARGE DIAGNOSES:  ?Principal Problem: ?  Acute on chronic respiratory failure with hypoxia (HCC) ?Active Problems: ?  Recurrent pleural effusion on right ?  Acute metabolic encephalopathy ?  Stage 5 chronic kidney disease ([place]) ?  Chronic diastolic CHF (congestive heart failure) ([place]) ?  DM2 (diabetes mellitus, type 2) ([place]) ?  HTN (hypertension) ?  Strongyloidiasis ? ? ?RECOMMENDATIONS FOR OUTPATIENT FOLLOW UP: ?Patient to continue dialysis on a Tuesday Thursday Saturday schedule ?Monitor CBGs and adjust insulin dose as needed. ?Follow-up fungal culture from bronchoalveolar lavage, which is still pending. ? ? ? ?Home Health: Going to SNF ?Equipment/Devices: None ? ?CODE STATUS: Full code ? ?DISCHARGE CONDITION: fair ? ?Diet recommendation: Modified carbohydrate ? ?INITIAL HISTORY: ?71 y.o. 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  Apparently uses 2 L of oxygen by nasal cannula at home as needed.  Patient with history of recurrent pleural effusion.  Seen by pulmonology.  Had high oxygen requirements.  Was treated transferred to the ICU.  Underwent diagnostic and therapeutic thoracocentesis with removal of 1500 MLS of fluid on 07/23/2021.  Patient then underwent flexible bronchoscopy with brushing and bronchoalveolar lavage and transbronchial lung biopsy on 07/24/2021 for nonresolving opacity with recurrent pleural effusion. Required BiPAP.  Then transitioned to high flow nasal cannula.  BiPAP recommended at nighttime by pulmonology which the patient has been refusing.   Subsequently transferred back to floor.  Mentation has been gradually improving.  Remains deconditioned. ? ?Consultants: Pulmonology.  Nephrology ?  ?Procedures: Thoracentesis.  Bronchoscopy.  Hemodialysis ? ? ?HOSPITAL COURSE:  ? ?* Acute on chronic respiratory failure with hypoxia ([place]) ?Recurrent pleural effusion ?Chronic bibasilar infiltrate ? ?Underwent thoracentesis.  Seen by pulmonology.   ?Pleural effusion likely due to combination of fluid overload as well as infection. ?Status post thoracocentesis on  07/23/2021 with removal of 1500 mL of pleural fluid.  Pleural fluid culture negative in 24 hours.  Additionally, patient underwent flexible bronchoscopy with brushing and bronchoalveolar lavage and transbronchial lung biopsy on 07/24/2021.  ?BAL positive for Prevotella.  Completed course of Augmentin. Follow fungal cultures (still pending as of 4/10).   ?WBC is now normal. ?Respiratory status has improved.  Now saturating normal on room air.  ? ?Acute metabolic encephalopathy ?Likely ICU delirium component to confusion/lethargy vs uremia ?Mentation is gradually improving.  No focal neurological deficits.   ? ?Stage 5 chronic kidney disease ([place]) ?Progression to End-stage renal disease ?Hyponatremia ? ?Progression of kidney disease during this admission.  Nephrology was consulted.  Initially given high-dose furosemide without improvement.  Subsequently started on hemodialysis. ?Will be discharged on a Tuesday Thursday Saturday schedule.  Has been established at an outpatient center. ? ?Chronic diastolic CHF (congestive heart failure) ([place]) ?Volume being managed with hemodialysis.   ?2D echocardiogram done 07/23/2021 shows preserved LV function with LV ejection fraction of 55 to 60% with no regional wall motion abnormality and grade 2 diastolic dysfunction. ? ?DM2 (diabetes mellitus, type 2) ([place]) ?HbA1c 7.0.  Continue glargine insulin.  Monitor CBGs.  Dose had to be adjusted to low glucose levels. ? ?Essential  hypertension  ?Drop in blood pressure noted while on dialysis.  Taken off  of amlodipine carvedilol and diuretics. ?Blood pressure is reasonably well controlled on hydralazine 25 mg 3 times a day and isosorbide mononitrate 60 mg once a day.   ? ?Strongyloidiasis ?-Should have been effectively treated with Ivermectin x 2 rounds ?-F/u with ID and/or transplant as needed ? ? ?Patient is stable.  Okay for discharge to skilled nursing facility. ? ? ? ?PERTINENT LABS: ? ?The results of significant diagnostics from this hospitalization (including imaging, microbiology, ancillary and laboratory) are listed below for reference.   ? ? ?Labs: ? ?COVID-19 Labs ? ?Lab Results  ?Component Value Date  ? [place] NEGATIVE 07/22/2021  ? [place] NEGATIVE 04/17/2020  ? [place] NEGATIVE 04/14/2020  ? [place] NEGATIVE 01/17/2020  ? ? ? ? ?Basic Metabolic Panel: ?Recent Labs  ?Lab 08/08/21 ?0846 08/11/21 ?1316 08/12/21 ?0005  ?NA 130* 137 136  ?K 3.4* 4.7 4.3  ?CL 96* 104 99  ?CO2 _0 ?GLUCOSE 255* 170* 169*  ?BUN 50* 48* 27*  ?CREATININE 4.37* 3.62* 3.06*  ?CALCIUM 7.9* 8.0* 8.2*  ?PHOS  --  2.3* 2.2*  ? ?Liver Function Tests: ?Recent Labs  ?Lab 08/11/21 ?1316 08/12/21 ?0005  ?ALBUMIN 2.7* 3.0*  ? ? ?CBC: ?Recent Labs  ?Lab 08/08/21 ?0846 08/11/21 ?1316 08/12/21 ?0005  ?WBC 9.8 7.6 8.9  ?HGB 11.0* 10.6* 11.3*  ?HCT 34.0* 33.7* 36.3*  ?MCV 85.9 88.0 87.7  ?PLT 245 240 224  ? ? ?BNP: ?BNP (last 3 results) ?Recent Labs  ?  03/26/21 ?0000 07/22/21 ?0321  ?BNP CANCELED 653.9*  ? ? ? ?CBG: ?Recent Labs  ?Lab 08/11/21 ?0804 08/11/21 ?1115 08/11/21 ?1856 08/11/21 ?1933 08/12/21 ?0804  ?GLUCAP 224* 170* 72 75 164*  ? ? ? ?IMAGING STUDIES ?CT CHEST WO CONTRAST ? ?Result Date: 07/23/2021 ?CLINICAL DATA:  Chronic dyspnea. EXAM: CT CHEST WITHOUT CONTRAST TECHNIQUE: Multidetector CT imaging of the chest was performed following the standard protocol without IV contrast. RADIATION DOSE REDUCTION: This exam was performed according to  the departmental dose-optimization program which includes automated exposure control, adjustment of the mA and/or kV according to patient size and/or use of iterative reconstruction technique. COMPARISON:  May 30, 2020. FINDINGS: Cardiovascular: Atherosclerosis of thoracic aorta is noted without aneurysm formation. Mild cardiomegaly is noted. No pericardial effusion is noted. Coronary artery calcifications are again noted. Mediastinum/Nodes: No enlarged mediastinal or axillary lymph nodes. Thyroid gland, trachea, and esophagus demonstrate no significant findings. Lungs/Pleura: No pneumothorax is noted. Airspace opacities are noted in both lower lobes as well as the right middle lobe most consistent with multifocal pneumonia. Small right pleural effusion is noted. Upper Abdomen: No acute abnormality. Musculoskeletal: No chest wall mass or suspicious bone lesions identified. IMPRESSION: Large airspace opacities are noted in both lower lobes and right middle lobe consistent with pneumonia. Small right pleural effusion is noted as well. Coronary artery calcifications are noted suggesting coronary artery disease. Aortic Atherosclerosis (ICD10-I70.0). Electronically Signed   By: Marijo Conception M.D.   On: 07/23/2021 12:55  ? ?DG Chest Port 1 View ? ?Result Date: 07/27/2021 ?CLINICAL DATA:  Respiratory distress.  Follow-up exam. EXAM: PORTABLE CHEST 1 VIEW COMPARISON:  07/25/2021 and older studies. FINDINGS: Subtle decrease in bilateral interstitial and hazy airspace lung opacities. More confluent opacity at the right lung base is without significant change. Small to moderate right pleural effusion and suspected small left pleural effusion, similar to the previous day's exam. No pneumothorax. IMPRESSION: 1. Slight interval improvement in lung aeration, with a subtle decrease in airspace lung opacities. No  new abnormalities. Electronically Signed   By: Lajean Manes M.D.   On: 07/27/2021 08:03  ? ?DG Chest Port 1  View ? ?Result Date: 07/25/2021 ?CLINICAL DATA:  71 year old male status post bronchoscopy. EXAM: PORTABLE CHEST 1 VIEW COMPARISON:  Chest x-ray 07/24/2021. FINDINGS: Patient has been extubated. Lung volumes are very l

## 2021-08-12 NOTE — Progress Notes (Signed)
Subjective:   Patient sitting in chair with no complaints.  Likely discharge today to SNF ? ?Objective 72Vital signs in last 24 hours: ?Vitals:  ? 08/11/21 1830 08/11/21 1902 08/11/21 1932 08/12/21 0500  ?BP: (!) 153/77 (!) 163/126 122/62   ?Pulse: 67 71 82   ?Resp: '18 18 20   '$ ?Temp: (!) 97.5 ?F (36.4 ?C)  98.3 ?F (36.8 ?C)   ?TempSrc: Temporal  Oral   ?SpO2: 95% 98% 100%   ?Weight: 85.4 kg   89.2 kg  ?Height:      ? ?Weight change:  ? ?Intake/Output Summary (Last 24 hours) at 08/12/2021 1127 ?Last data filed at 08/11/2021 2100 ?Gross per 24 hour  ?Intake 340 ml  ?Output 2200 ml  ?Net -1860 ml  ? ? ? ? ? ?Assessment/ Plan: ?Pt is a 71 y.o. yo male with advanced CKD at baseline who was admitted on 07/22/2021 with  respiratory failure--  dialysis has been initiated this hospitalization for progression to ESRD ? ?Assessment/Plan: ?1. Renal-  dialysis has been initiated this hospitalization for ESRD-  have been able to use his AVF- first treatment 3/23-  going to be TTS at [facility].   ?-Likely outpatient dialysis on 4/11 at [facility] ?2. HTN/volume-  volume status much better, on hydral/ imdur ?3. MS-  has only been very slow to improve but is improving ?4. Anemia-  hgb has been great-  has not required any treatment  ?5. Secondary hyperparathyroidism-  on phoslo-calcitriol ?6. Dispo- deconditioned-  tried for rehab-  did not get approved, appeal made.  Looking at SNF now; likely discharge today ? ? ?Reesa Chew  ? ? ?Labs: ?Basic Metabolic Panel: ?Recent Labs  ?Lab 08/08/21 ?0846 08/11/21 ?1316 08/12/21 ?0005  ?NA 130* 137 136  ?K 3.4* 4.7 4.3  ?CL 96* 104 99  ?CO2 '24 26 31  '$ ?GLUCOSE 255* 170* 169*  ?BUN 50* 48* 27*  ?CREATININE 4.37* 3.62* 3.06*  ?CALCIUM 7.9* 8.0* 8.2*  ?PHOS  --  2.3* 2.2*  ? ?Liver Function Tests: ?Recent Labs  ?Lab 08/11/21 ?1316 08/12/21 ?0005  ?ALBUMIN 2.7* 3.0*  ? ?No results for input(s): LIPASE, AMYLASE in the last 168 hours. ?No results for input(s): AMMONIA in the last 168 hours. ?CBC: ?Recent Labs   ?Lab 08/08/21 ?0846 08/11/21 ?1316 08/12/21 ?0005  ?WBC 9.8 7.6 8.9  ?HGB 11.0* 10.6* 11.3*  ?HCT 34.0* 33.7* 36.3*  ?MCV 85.9 88.0 87.7  ?PLT 245 240 224  ? ?Cardiac Enzymes: ?No results for input(s): CKTOTAL, CKMB, CKMBINDEX, TROPONINI in the last 168 hours. ?CBG: ?Recent Labs  ?Lab 08/11/21 ?0804 08/11/21 ?1115 08/11/21 ?1856 08/11/21 ?1933 08/12/21 ?0804  ?GLUCAP 224* 170* 72 75 164*  ? ? ?Iron Studies: No results for input(s): IRON, TIBC, TRANSFERRIN, FERRITIN in the last 72 hours. ?Studies/Results: ?No results found. ?Medications: ?Infusions: ? sodium chloride    ? sodium chloride    ? ? ?Scheduled Medications: ? aspirin  81 mg Oral Daily  ? calcitRIOL  0.25 mcg Oral QODAY  ? calcium acetate  1,334 mg Oral TID WC  ? calcium carbonate  2 tablet Oral QHS  ? Chlorhexidine Gluconate Cloth  6 each Topical Q0600  ? Chlorhexidine Gluconate Cloth  6 each Topical Q0600  ? docusate sodium  100 mg Oral BID  ? feeding supplement  237 mL Oral BID BM  ? heparin  5,000 Units Subcutaneous Q8H  ? hydrALAZINE  25 mg Oral TID  ? insulin aspart  0-6 Units Subcutaneous TID WC  ? [  START ON 08/13/2021] insulin glargine-yfgn  18 Units Subcutaneous Daily  ? isosorbide mononitrate  60 mg Oral QPM  ? ketotifen  1 drop Both Eyes BID  ? kidney failure book   Does not apply Once  ? meclizine  25 mg Oral BID  ? mouth rinse  15 mL Mouth Rinse BID  ? multivitamin  1 tablet Oral QHS  ? polyethylene glycol  17 g Oral Daily  ? senna-docusate  1 tablet Oral QHS  ? sodium chloride flush  3 mL Intravenous Q12H  ? ? have reviewed scheduled and prn medications. ? ?Physical Exam: ?General:  NAD, sitting in chair ?Heart: normal rate ?Lungs: No increased work of breathing, bilateral chest rise ?Abdomen: soft, non tender ?Extremities: no edema, warm and well perfused ?Dialysis Access: left upper AVF-  good thrill and bruit   ? ? ?08/12/2021,11:27 AM ? LOS: 20 days  ? ?  ? ? ? ? ?

## 2021-08-12 NOTE — Progress Notes (Signed)
Report called to [place], nurse at [employer].  Patient transported via [name].  Wife assisted with personal belongings.  ?

## 2021-08-12 NOTE — Progress Notes (Signed)
Discharge instructions (including medications) discussed with and copy provided to patient/caregiver 

## 2021-08-16 LAB — GLUCOSE, CAPILLARY: Glucose-Capillary: <10 mg/dL — CL (ref 70–99)

## 2021-08-23 LAB — FUNGUS CULTURE RESULT

## 2021-08-23 LAB — FUNGUS CULTURE WITH STAIN

## 2021-08-23 LAB — FUNGAL ORGANISM REFLEX

## 2021-09-08 LAB — ACID FAST CULTURE WITH REFLEXED SENSITIVITIES (MYCOBACTERIA): Acid Fast Culture: NEGATIVE

## 2021-09-27 ENCOUNTER — Ambulatory Visit: Payer: Medicare HMO | Admitting: Podiatry

## 2021-09-27 ENCOUNTER — Encounter: Payer: Self-pay | Admitting: Podiatry

## 2021-09-27 DIAGNOSIS — M79674 Pain in right toe(s): Secondary | ICD-10-CM | POA: Diagnosis not present

## 2021-09-27 DIAGNOSIS — N184 Chronic kidney disease, stage 4 (severe): Secondary | ICD-10-CM

## 2021-09-27 DIAGNOSIS — E1100 Type 2 diabetes mellitus with hyperosmolarity without nonketotic hyperglycemic-hyperosmolar coma (NKHHC): Secondary | ICD-10-CM

## 2021-09-27 DIAGNOSIS — E1122 Type 2 diabetes mellitus with diabetic chronic kidney disease: Secondary | ICD-10-CM

## 2021-09-27 DIAGNOSIS — B351 Tinea unguium: Secondary | ICD-10-CM | POA: Diagnosis not present

## 2021-09-27 DIAGNOSIS — M2141 Flat foot [pes planus] (acquired), right foot: Secondary | ICD-10-CM

## 2021-09-27 DIAGNOSIS — M2142 Flat foot [pes planus] (acquired), left foot: Secondary | ICD-10-CM

## 2021-09-27 DIAGNOSIS — M79675 Pain in left toe(s): Secondary | ICD-10-CM | POA: Diagnosis not present

## 2021-09-27 NOTE — Progress Notes (Signed)
This patient returns to my office for at risk foot care.  This patient requires this care by a professional since this patient will be at risk due to having diabetes and CKD.  This patient is unable to cut nails himself since the patient cannot reach his nails.These nails are painful walking and wearing shoes.  This patient presents for at risk foot care today.  General Appearance  Alert, conversant and in no acute stress.  Vascular  Dorsalis pedis and posterior tibial  pulses are palpable  bilaterally.  Capillary return is within normal limits  bilaterally. Temperature is within normal limits  bilaterally.  Neurologic  Senn-Weinstein monofilament wire test within normal limits  bilaterally. Muscle power within normal limits bilaterally.  Nails Thick disfigured discolored nails with subungual debris  from hallux to fifth toes bilaterally. No evidence of bacterial infection or drainage bilaterally.  Orthopedic  No limitations of motion  feet .  No crepitus or effusions noted.  No bony pathology or digital deformities noted.  Skin  normotropic skin with no porokeratosis noted bilaterally.  No signs of infections or ulcers noted.     Onychomycosis  Pain in right toes  Pain in left toes  Consent was obtained for treatment procedures.   Mechanical debridement of nails 1-5  bilaterally performed with a nail nipper.  Filed with dremel without incident.    Return office visit   3 months                   Told patient to return for periodic foot care and evaluation due to potential at risk complications.   Kwaku Mostafa DPM   

## 2021-10-03 DIAGNOSIS — N2581 Secondary hyperparathyroidism of renal origin: Secondary | ICD-10-CM | POA: Diagnosis not present

## 2021-10-03 DIAGNOSIS — Z992 Dependence on renal dialysis: Secondary | ICD-10-CM | POA: Diagnosis not present

## 2021-10-03 DIAGNOSIS — N186 End stage renal disease: Secondary | ICD-10-CM | POA: Diagnosis not present

## 2021-10-05 DIAGNOSIS — N186 End stage renal disease: Secondary | ICD-10-CM | POA: Diagnosis not present

## 2021-10-05 DIAGNOSIS — N2581 Secondary hyperparathyroidism of renal origin: Secondary | ICD-10-CM | POA: Diagnosis not present

## 2021-10-05 DIAGNOSIS — Z992 Dependence on renal dialysis: Secondary | ICD-10-CM | POA: Diagnosis not present

## 2021-10-08 DIAGNOSIS — N2581 Secondary hyperparathyroidism of renal origin: Secondary | ICD-10-CM | POA: Diagnosis not present

## 2021-10-08 DIAGNOSIS — N186 End stage renal disease: Secondary | ICD-10-CM | POA: Diagnosis not present

## 2021-10-08 DIAGNOSIS — Z992 Dependence on renal dialysis: Secondary | ICD-10-CM | POA: Diagnosis not present

## 2021-10-10 DIAGNOSIS — Z992 Dependence on renal dialysis: Secondary | ICD-10-CM | POA: Diagnosis not present

## 2021-10-10 DIAGNOSIS — N2581 Secondary hyperparathyroidism of renal origin: Secondary | ICD-10-CM | POA: Diagnosis not present

## 2021-10-10 DIAGNOSIS — N186 End stage renal disease: Secondary | ICD-10-CM | POA: Diagnosis not present

## 2021-10-12 DIAGNOSIS — N2581 Secondary hyperparathyroidism of renal origin: Secondary | ICD-10-CM | POA: Diagnosis not present

## 2021-10-12 DIAGNOSIS — N186 End stage renal disease: Secondary | ICD-10-CM | POA: Diagnosis not present

## 2021-10-12 DIAGNOSIS — Z992 Dependence on renal dialysis: Secondary | ICD-10-CM | POA: Diagnosis not present

## 2021-10-15 DIAGNOSIS — N186 End stage renal disease: Secondary | ICD-10-CM | POA: Diagnosis not present

## 2021-10-15 DIAGNOSIS — Z992 Dependence on renal dialysis: Secondary | ICD-10-CM | POA: Diagnosis not present

## 2021-10-15 DIAGNOSIS — N2581 Secondary hyperparathyroidism of renal origin: Secondary | ICD-10-CM | POA: Diagnosis not present

## 2021-10-17 DIAGNOSIS — N2581 Secondary hyperparathyroidism of renal origin: Secondary | ICD-10-CM | POA: Diagnosis not present

## 2021-10-17 DIAGNOSIS — Z992 Dependence on renal dialysis: Secondary | ICD-10-CM | POA: Diagnosis not present

## 2021-10-17 DIAGNOSIS — N186 End stage renal disease: Secondary | ICD-10-CM | POA: Diagnosis not present

## 2021-10-17 NOTE — Progress Notes (Signed)
Cardiology Clinic Note   Patient Name: Melvin Mcclure Date of Encounter: 10/18/2021  Primary Care Provider:  Nolene Ebbs, MD Primary Cardiologist:  Melvin Ruths, MD  Patient Profile    71 year old male with chronic diastolic heart failure, oxygen dependent.  Hypertension, chronic stage IV kidney disease on dialysis, strongyloidiasis, type 2 diabetes, and hyperlipidemia,.  Most recent echocardiogram June 2021 revealed normal LV function, moderate left ventricular hypertrophy, mild mitral regurgitation, trivial small pericardial effusion.  He has had several admissions for decompensated CHF.  He has had a thoracentesis on 3/22/,2023.  He is also undergoing renal transplant evaluation.  Recently discharged on 08/12/2021 in the setting of acute on chronic respiratory failure with hypoxia, recurrent pleural effusion on the right, chronic diastolic heart failure, and acute metabolic encephalopathy.  Past Medical History    Past Medical History:  Diagnosis Date   Anemia in chronic kidney disease (CKD)    stage 5   Anxiety    Arthritis    CHF (congestive heart failure) (Melvin Mcclure) 05/28/2018   dx 05/28/18   Diabetes mellitus without complication (HCC)    Hyperlipidemia    Hypertension    Iron deficiency anemia    Neuromuscular disorder (HCC)    neuropathy feet/hands   Perforating neurotrophic ulcer of foot ([place])    Stage 4 chronic kidney disease ([place])    Past Surgical History:  Procedure Laterality Date   ACHILLES TENDON REPAIR Bilateral    AV FISTULA PLACEMENT Left 03/21/2021   Procedure: LEFT BRACHIOCEPHALIC ARTERIOVENOUS (AV) FISTULA CREATION;  Surgeon: Melvin Robins, MD;  Location: [facility];  Service: Vascular;  Laterality: Left;   BRONCHIAL BIOPSY  07/24/2021   Procedure: BRONCHIAL BIOPSIES;  Surgeon: Melvin Furbish, MD;  Location: [facility] ENDOSCOPY;  Service: Pulmonary;;   BRONCHIAL BRUSHINGS Right 07/24/2021   Procedure: BRONCHIAL BRUSHINGS;  Surgeon: Melvin Furbish, MD;  Location: [place];  Service: Pulmonary;  Laterality: Right;   BRONCHIAL WASHINGS  07/24/2021   Procedure: BRONCHIAL WASHINGS;  Surgeon: Melvin Furbish, MD;  Location: [place];  Service: Pulmonary;;   COLONOSCOPY     CYST EXCISION     lipoma removed from arm   IR THORACENTESIS ASP PLEURAL SPACE W/IMG GUIDE  12/26/2019   IR THORACENTESIS ASP PLEURAL SPACE W/IMG GUIDE  01/18/2020   THORACENTESIS  04/16/2020   VIDEO BRONCHOSCOPY Right 07/24/2021   Procedure: VIDEO BRONCHOSCOPY WITH FLUORO;  Surgeon: Melvin Furbish, MD;  Location: [facility] ENDOSCOPY;  Service: Pulmonary;  Laterality: Right;    Allergies  No Known Allergies  History of Present Illness    [name] presents today post hospitalization after admission 07/22/2021 through 08/12/2021, for acute on chronic diastolic heart failure, respiratory failure with hypoxia, recurrent pleural effusion, and acute metabolic encephalopathy with stage V chronic kidney disease requiring dialysis, to be completed on Tuesdays Thursdays and Saturdays.  During hospitalization he was shown to have hypotension and therefore amlodipine, carvedilol and diuretics were discontinued.  Blood pressure was well controlled on hydralazine 25 mg 3 times daily and isosorbide mononitrate 60 mg once a day.   He comes today with his wife who is a Equities trader, who reports that nephrology has discontinued hydralazine, isosorbide, and metolazone.  This is due to hypotension as well as significant hypotension during dialysis.  The patient's dry weight is currently 187 to 188 pounds.  He denies any chest pain, palpitations, or dyspnea.  He does complain of generalized fatigue, some transient depression associated with his diagnoses of end-stage renal disease.  He is on the renal transplant list.  Home Medications    Current Outpatient Medications  Medication Sig Dispense Refill   acetaminophen (TYLENOL) 500 MG tablet Take 500-1,000 mg by mouth every 6 (six) hours as needed for  moderate pain or headache.     aspirin 81 MG chewable tablet Chew 81 mg by mouth daily.     blood glucose meter kit and supplies KIT Dispense based on patient and insurance preference. Use up to four times daily as directed. (FOR ICD-9 250.00, 250.01). 1 each 0   calcium acetate (PHOSLO) 667 MG capsule Take 1 capsule (667 mg total) by mouth 3 (three) times daily with meals. 90 capsule 1   calcium carbonate (TUMS - DOSED IN MG ELEMENTAL CALCIUM) 500 MG chewable tablet Chew 2 tablets by mouth at bedtime.     diclofenac Sodium (VOLTAREN) 1 % GEL Apply 2 g topically 4 (four) times daily. (Patient taking differently: Apply 2 g topically 4 (four) times daily as needed (pain).)     docusate sodium (COLACE) 100 MG capsule Take 1 capsule (100 mg total) by mouth 2 (two) times daily. 10 capsule 0   HYDROcodone-acetaminophen (NORCO/VICODIN) 5-325 MG tablet Take 1 tablet by mouth 2 (two) times daily as needed for moderate pain. 20 tablet 0   hydrOXYzine (ATARAX) 25 MG tablet Take by mouth.     insulin glargine, 2 Unit Dial, (TOUJEO MAX SOLOSTAR) 300 UNIT/ML Solostar Pen Inject 18 Units into the skin every morning.     meclizine (ANTIVERT) 25 MG tablet Take 1 tablet (25 mg total) by mouth 3 (three) times daily. (Patient taking differently: Take 25 mg by mouth 2 (two) times daily.) 30 tablet 0   multivitamin (RENA-VIT) TABS tablet Take 1 tablet by mouth at bedtime.  0   polyethylene glycol (MIRALAX / GLYCOLAX) 17 g packet Take 17 g by mouth daily. 14 each 0   senna-docusate (SENOKOT-S) 8.6-50 MG tablet Take 1 tablet by mouth 2 (two) times daily as needed for mild constipation.      Torsemide 40 MG TABS Take 40 mg by mouth every other day. Take 40 mg on Non- Dyalisis Days     calcitRIOL (ROCALTROL) 0.25 MCG capsule Take 0.25 mcg by mouth every other day. (Patient not taking: Reported on 10/18/2021)     feeding supplement (ENSURE ENLIVE / ENSURE PLUS) LIQD Take 237 mLs by mouth 2 (two) times daily between meals.  (Patient not taking: Reported on 10/18/2021) 237 mL 12   metolazone (ZAROXOLYN) 5 MG tablet Take 5 mg by mouth every Sunday. (Patient not taking: Reported on 10/18/2021)     No current facility-administered medications for this visit.     Family History    Family History  Problem Relation Age of Onset   Diabetes Mellitus II Mother    Hypertension Father    He indicated that his mother is deceased. He indicated that his father is alive.  Social History    Social History   Socioeconomic History   Marital status: Married    Spouse name: Not on file   Number of children: Not on file   Years of education: Not on file   Highest education level: Not on file  Occupational History   Not on file  Tobacco Use   Smoking status: Former    Packs/day: 1.00    Years: 1.50    Total pack years: 1.50    Types: Cigarettes   Smokeless tobacco: Never   Tobacco comments:  smoked for only 1.5 years in the 70's  Vaping Use   Vaping Use: Never used  Substance and Sexual Activity   Alcohol use: No    Alcohol/week: 0.0 standard drinks of alcohol   Drug use: No   Sexual activity: Not on file  Other Topics Concern   Not on file  Social History Narrative   Not on file   Social Determinants of Health   Financial Resource Strain: Not on file  Food Insecurity: Not on file  Transportation Needs: Not on file  Physical Activity: Not on file  Stress: Not on file  Social Connections: Not on file  Intimate Partner Violence: Not on file     Review of Systems    General:  No chills, fever, night sweats or weight changes.  Generalized fatigue Cardiovascular:  No chest pain, dyspnea on exertion, edema, orthopnea, palpitations, paroxysmal nocturnal dyspnea. Dermatological: No rash, lesions/masses Respiratory: No cough, dyspnea Urologic: No hematuria, dysuria Abdominal:   No nausea, vomiting, diarrhea, bright red blood per rectum, melena, or hematemesis Neurologic:  No visual changes, wkns,  changes in mental status. All other systems reviewed and are otherwise negative except as noted above.     Physical Exam    VS:  BP 122/66   Pulse 91   Ht $R'5\' 10"'nN$  (1.778 m)   Wt 188 lb 9.6 oz (85.5 kg)   SpO2 92%   BMI 27.06 kg/m  , BMI Body mass index is 27.06 kg/m.     GEN: Well nourished, well developed, in no acute distress. HEENT: normal. Neck: Supple, no JVD, carotid bruits, or masses. Cardiac: RRR, no murmurs, rubs, or gallops. No clubbing, cyanosis, edema.  Radials/DP/PT 2+ and equal bilaterally.  Left forearm dialysis shunt present with good thrill, dressing remains. Respiratory:  Respirations regular and unlabored, clear to auscultation bilaterally. GI: Soft, nontender, nondistended, BS + x 4. MS: no deformity or atrophy. Skin: warm and dry, no rash. Neuro:  Strength and sensation are intact. Psych: Normal affect.  Accessory Clinical Findings     Lab Results  Component Value Date   WBC 8.9 08/12/2021   HGB 11.3 (L) 08/12/2021   HCT 36.3 (L) 08/12/2021   MCV 87.7 08/12/2021   PLT 224 08/12/2021   Lab Results  Component Value Date   CREATININE 3.06 (H) 08/12/2021   BUN 27 (H) 08/12/2021   NA 136 08/12/2021   K 4.3 08/12/2021   CL 99 08/12/2021   CO2 31 08/12/2021   Lab Results  Component Value Date   ALT 8 (L) 06/14/2021   AST 13 06/14/2021   ALKPHOS 84 04/17/2020   BILITOT 0.4 06/14/2021   Lab Results  Component Value Date   CHOL 178 11/29/2020   HDL 35 (L) 11/29/2020   LDLCALC 118 (H) 11/29/2020   TRIG 130 11/29/2020   CHOLHDL 5.1 (H) 11/29/2020    Lab Results  Component Value Date   HGBA1C 7.0 (H) 07/22/2021    Review of Prior Studies: Echocardiogram 07/23/2021  1. Left ventricular ejection fraction, by estimation, is 55 to 60%. The  left ventricle has normal function. The left ventricle has no regional  wall motion abnormalities. There is mild concentric left ventricular  hypertrophy. Left ventricular diastolic  parameters are  consistent with Grade II diastolic dysfunction  (pseudonormalization).   2. Right ventricular systolic function is normal. The right ventricular  size is normal. Tricuspid regurgitation signal is inadequate for assessing  PA pressure.   3. The mitral valve is normal  in structure. No evidence of mitral valve  regurgitation. No evidence of mitral stenosis.   4. The aortic valve is tricuspid. There is mild calcification of the  aortic valve. Aortic valve regurgitation is not visualized. No aortic  stenosis is present.   5. The inferior vena cava is dilated in size with >50% respiratory  variability, suggesting right atrial pressure of 8 mmHg.   6. No LVAD. BP at closest time of evaluation 111/58 mm Hg (listed as 0/0  above).   NM Stress Test 07/07/2018 The left ventricular ejection fraction is normal (55-65%). Nuclear stress EF: 57%. No wall motion abnormalities There was no ST segment deviation noted during stress. Defect 1: There is a medium defect of mild severity present in the basal inferior location. This appears secondary to diaphragmatic attenuation artifact This is a low risk study. No significant evidence of ischemia identified.  Assessment & Plan   1.  Chronic diastolic CHF: He is being followed by nephrology for volume management.  He is no longer on any diuretics.  Blood pressure currently is well controlled.  No changes or additions to his medication regimen at this time.  May consider repeating echocardiogram in the future on follow-up appointments.  He is no longer on oxygen supplementation daily.  But does use it as needed according to his wife  2.  Hypertension: Excellent control of blood pressure currently.  As stated above nephrology has discontinued hydralazine, isosorbide, and metolazone.  He is no longer on any antihypertensives at all.  He was having significant hypotension prior to dialysis and also during.  His wife would like for [place] to repeat his be met even though  nephrology does do this during dialysis.  This is being ordered today so that she will have current information.   3.  End-stage renal disease: Dialysis on Tuesdays Thursdays and Saturdays.  Volume management per nephrology.  Dry weight appears to be 187 to 188 pounds.  4.  Hyperlipidemia: LDL 118, HDL 35, total cholesterol 178, on 11/29/2020.  He is currently not on statin therapy.  With cardiovascular risk factors to include hypertension, type 2 diabetes, age, and gender, may need to consider addition of low-dose statin therapy with a goal of LDL less than 70.  On review of his allergies, there is no indication of allergies to statin therapy.  Would recommend checking fasting lipids and LFTs on follow-up appointment if not completed by PCP.  Current medicines are reviewed at length with the patient today.  I have spent 30 min's  dedicated to the care of this patient on the date of this encounter to include pre-visit review of records, assessment, management and diagnostic testing,with shared decision making.  Signed, Phill Myron. West Pugh, ANP, AACC   10/18/2021 12:08 PM    [facility] [place] [address] Office [phone] Fax [phone]  Notice: This dictation was prepared with Dragon dictation along with smaller phrase technology. Any transcriptional errors that result from this process are unintentional and may not be corrected upon review.

## 2021-10-18 ENCOUNTER — Ambulatory Visit: Payer: Medicare HMO | Admitting: Adult Health

## 2021-10-18 ENCOUNTER — Encounter: Payer: Self-pay | Admitting: Adult Health

## 2021-10-18 VITALS — BP 122/66 | HR 91 | Ht 70.0 in | Wt 188.6 lb

## 2021-10-18 DIAGNOSIS — I5020 Unspecified systolic (congestive) heart failure: Secondary | ICD-10-CM

## 2021-10-18 NOTE — Patient Instructions (Signed)
Medication Instructions:  No Changes *If you need a refill on your cardiac medications before your next appointment, please call your pharmacy*   Lab Work: CMET,CBC If you have labs (blood work) drawn today and your tests are completely normal, you will receive your results only by: [place] (if you have MyChart) OR A paper copy in the mail If you have any lab test that is abnormal or we need to change your treatment, we will call you to review the results.   Testing/Procedures: No Testing   Follow-Up: At [facility], you and your health needs are our priority.  As part of our continuing mission to provide you with exceptional heart care, we have created designated Provider Care Teams.  These Care Teams include your primary Cardiologist (physician) and Advanced Practice Providers (APPs -  Physician Assistants and Nurse Practitioners) who all work together to provide you with the care you need, when you need it.  We recommend signing up for the patient portal called "MyChart".  Sign up information is provided on this After Visit Summary.  MyChart is used to connect with patients for Virtual Visits (Telemedicine).  Patients are able to view lab/test results, encounter notes, upcoming appointments, etc.  Non-urgent messages can be sent to your provider as well.   To learn more about what you can do with MyChart, go to [email or web address].    Your next appointment:   6 month(s)  The format for your next appointment:   In Person  Provider:   Kirk Ruths, MD       Important Information About Sugar

## 2021-10-19 DIAGNOSIS — N186 End stage renal disease: Secondary | ICD-10-CM | POA: Diagnosis not present

## 2021-10-19 DIAGNOSIS — Z992 Dependence on renal dialysis: Secondary | ICD-10-CM | POA: Diagnosis not present

## 2021-10-19 DIAGNOSIS — N2581 Secondary hyperparathyroidism of renal origin: Secondary | ICD-10-CM | POA: Diagnosis not present

## 2021-10-19 LAB — COMPREHENSIVE METABOLIC PANEL
ALT: 11 IU/L (ref 0–44)
AST: 16 IU/L (ref 0–40)
Albumin/Globulin Ratio: 1.1 — ABNORMAL LOW (ref 1.2–2.2)
Albumin: 4 g/dL (ref 3.7–4.7)
Alkaline Phosphatase: 99 IU/L (ref 44–121)
BUN/Creatinine Ratio: 6 — ABNORMAL LOW (ref 10–24)
BUN: 23 mg/dL (ref 8–27)
Bilirubin Total: 0.4 mg/dL (ref 0.0–1.2)
CO2: 32 mmol/L — ABNORMAL HIGH (ref 20–29)
Calcium: 8.4 mg/dL — ABNORMAL LOW (ref 8.6–10.2)
Chloride: 91 mmol/L — ABNORMAL LOW (ref 96–106)
Creatinine, Ser: 3.56 mg/dL — ABNORMAL HIGH (ref 0.76–1.27)
Globulin, Total: 3.8 g/dL (ref 1.5–4.5)
Glucose: 317 mg/dL — ABNORMAL HIGH (ref 70–99)
Potassium: 3.9 mmol/L (ref 3.5–5.2)
Sodium: 136 mmol/L (ref 134–144)
Total Protein: 7.8 g/dL (ref 6.0–8.5)
eGFR: 18 mL/min/{1.73_m2} — ABNORMAL LOW (ref >59)

## 2021-10-19 LAB — CBC
Hematocrit: 38.8 % (ref 37.5–51.0)
Hemoglobin: 12.7 g/dL — ABNORMAL LOW (ref 13.0–17.7)
MCH: 28.5 pg (ref 26.6–33.0)
MCHC: 32.7 g/dL (ref 31.5–35.7)
MCV: 87 fL (ref 79–97)
Platelets: 216 10*3/uL (ref 150–450)
RBC: 4.46 x10E6/uL (ref 4.14–5.80)
RDW: 14.8 % (ref 11.6–15.4)
WBC: 8 10*3/uL (ref 3.4–10.8)

## 2021-10-22 DIAGNOSIS — I43 Cardiomyopathy in diseases classified elsewhere: Secondary | ICD-10-CM | POA: Diagnosis not present

## 2021-10-22 DIAGNOSIS — Z992 Dependence on renal dialysis: Secondary | ICD-10-CM | POA: Diagnosis not present

## 2021-10-22 DIAGNOSIS — J90 Pleural effusion, not elsewhere classified: Secondary | ICD-10-CM | POA: Diagnosis not present

## 2021-10-22 DIAGNOSIS — N186 End stage renal disease: Secondary | ICD-10-CM | POA: Diagnosis not present

## 2021-10-22 DIAGNOSIS — I110 Hypertensive heart disease with heart failure: Secondary | ICD-10-CM | POA: Diagnosis not present

## 2021-10-22 DIAGNOSIS — N2581 Secondary hyperparathyroidism of renal origin: Secondary | ICD-10-CM | POA: Diagnosis not present

## 2021-10-24 DIAGNOSIS — N2581 Secondary hyperparathyroidism of renal origin: Secondary | ICD-10-CM | POA: Diagnosis not present

## 2021-10-24 DIAGNOSIS — Z992 Dependence on renal dialysis: Secondary | ICD-10-CM | POA: Diagnosis not present

## 2021-10-24 DIAGNOSIS — N186 End stage renal disease: Secondary | ICD-10-CM | POA: Diagnosis not present

## 2021-10-26 DIAGNOSIS — N186 End stage renal disease: Secondary | ICD-10-CM | POA: Diagnosis not present

## 2021-10-26 DIAGNOSIS — N2581 Secondary hyperparathyroidism of renal origin: Secondary | ICD-10-CM | POA: Diagnosis not present

## 2021-10-26 DIAGNOSIS — Z992 Dependence on renal dialysis: Secondary | ICD-10-CM | POA: Diagnosis not present

## 2021-10-29 DIAGNOSIS — N2581 Secondary hyperparathyroidism of renal origin: Secondary | ICD-10-CM | POA: Diagnosis not present

## 2021-10-29 DIAGNOSIS — Z992 Dependence on renal dialysis: Secondary | ICD-10-CM | POA: Diagnosis not present

## 2021-10-29 DIAGNOSIS — N186 End stage renal disease: Secondary | ICD-10-CM | POA: Diagnosis not present

## 2021-10-30 DIAGNOSIS — N186 End stage renal disease: Secondary | ICD-10-CM | POA: Diagnosis not present

## 2021-10-30 DIAGNOSIS — I10 Essential (primary) hypertension: Secondary | ICD-10-CM | POA: Diagnosis not present

## 2021-10-30 DIAGNOSIS — E1142 Type 2 diabetes mellitus with diabetic polyneuropathy: Secondary | ICD-10-CM | POA: Diagnosis not present

## 2021-10-30 DIAGNOSIS — I5032 Chronic diastolic (congestive) heart failure: Secondary | ICD-10-CM | POA: Diagnosis not present

## 2021-10-31 DIAGNOSIS — N2581 Secondary hyperparathyroidism of renal origin: Secondary | ICD-10-CM | POA: Diagnosis not present

## 2021-10-31 DIAGNOSIS — N186 End stage renal disease: Secondary | ICD-10-CM | POA: Diagnosis not present

## 2021-10-31 DIAGNOSIS — Z992 Dependence on renal dialysis: Secondary | ICD-10-CM | POA: Diagnosis not present

## 2021-11-01 DIAGNOSIS — Z992 Dependence on renal dialysis: Secondary | ICD-10-CM | POA: Diagnosis not present

## 2021-11-01 DIAGNOSIS — N186 End stage renal disease: Secondary | ICD-10-CM | POA: Diagnosis not present

## 2021-11-01 DIAGNOSIS — I129 Hypertensive chronic kidney disease with stage 1 through stage 4 chronic kidney disease, or unspecified chronic kidney disease: Secondary | ICD-10-CM | POA: Diagnosis not present

## 2021-11-02 DIAGNOSIS — N2581 Secondary hyperparathyroidism of renal origin: Secondary | ICD-10-CM | POA: Diagnosis not present

## 2021-11-02 DIAGNOSIS — N186 End stage renal disease: Secondary | ICD-10-CM | POA: Diagnosis not present

## 2021-11-02 DIAGNOSIS — Z992 Dependence on renal dialysis: Secondary | ICD-10-CM | POA: Diagnosis not present

## 2021-11-05 DIAGNOSIS — N2581 Secondary hyperparathyroidism of renal origin: Secondary | ICD-10-CM | POA: Diagnosis not present

## 2021-11-05 DIAGNOSIS — N186 End stage renal disease: Secondary | ICD-10-CM | POA: Diagnosis not present

## 2021-11-05 DIAGNOSIS — Z992 Dependence on renal dialysis: Secondary | ICD-10-CM | POA: Diagnosis not present

## 2021-11-07 DIAGNOSIS — N186 End stage renal disease: Secondary | ICD-10-CM | POA: Diagnosis not present

## 2021-11-07 DIAGNOSIS — N2581 Secondary hyperparathyroidism of renal origin: Secondary | ICD-10-CM | POA: Diagnosis not present

## 2021-11-07 DIAGNOSIS — Z992 Dependence on renal dialysis: Secondary | ICD-10-CM | POA: Diagnosis not present

## 2021-11-09 DIAGNOSIS — N2581 Secondary hyperparathyroidism of renal origin: Secondary | ICD-10-CM | POA: Diagnosis not present

## 2021-11-09 DIAGNOSIS — N186 End stage renal disease: Secondary | ICD-10-CM | POA: Diagnosis not present

## 2021-11-09 DIAGNOSIS — Z992 Dependence on renal dialysis: Secondary | ICD-10-CM | POA: Diagnosis not present

## 2021-11-12 DIAGNOSIS — N2581 Secondary hyperparathyroidism of renal origin: Secondary | ICD-10-CM | POA: Diagnosis not present

## 2021-11-12 DIAGNOSIS — N186 End stage renal disease: Secondary | ICD-10-CM | POA: Diagnosis not present

## 2021-11-12 DIAGNOSIS — Z992 Dependence on renal dialysis: Secondary | ICD-10-CM | POA: Diagnosis not present

## 2021-11-14 DIAGNOSIS — N2581 Secondary hyperparathyroidism of renal origin: Secondary | ICD-10-CM | POA: Diagnosis not present

## 2021-11-14 DIAGNOSIS — Z992 Dependence on renal dialysis: Secondary | ICD-10-CM | POA: Diagnosis not present

## 2021-11-14 DIAGNOSIS — N186 End stage renal disease: Secondary | ICD-10-CM | POA: Diagnosis not present

## 2021-11-16 DIAGNOSIS — N186 End stage renal disease: Secondary | ICD-10-CM | POA: Diagnosis not present

## 2021-11-16 DIAGNOSIS — N2581 Secondary hyperparathyroidism of renal origin: Secondary | ICD-10-CM | POA: Diagnosis not present

## 2021-11-16 DIAGNOSIS — Z992 Dependence on renal dialysis: Secondary | ICD-10-CM | POA: Diagnosis not present

## 2021-11-19 DIAGNOSIS — N2581 Secondary hyperparathyroidism of renal origin: Secondary | ICD-10-CM | POA: Diagnosis not present

## 2021-11-19 DIAGNOSIS — Z992 Dependence on renal dialysis: Secondary | ICD-10-CM | POA: Diagnosis not present

## 2021-11-19 DIAGNOSIS — N186 End stage renal disease: Secondary | ICD-10-CM | POA: Diagnosis not present

## 2021-11-21 DIAGNOSIS — Z992 Dependence on renal dialysis: Secondary | ICD-10-CM | POA: Diagnosis not present

## 2021-11-21 DIAGNOSIS — N2581 Secondary hyperparathyroidism of renal origin: Secondary | ICD-10-CM | POA: Diagnosis not present

## 2021-11-21 DIAGNOSIS — N186 End stage renal disease: Secondary | ICD-10-CM | POA: Diagnosis not present

## 2021-11-23 DIAGNOSIS — N186 End stage renal disease: Secondary | ICD-10-CM | POA: Diagnosis not present

## 2021-11-23 DIAGNOSIS — Z992 Dependence on renal dialysis: Secondary | ICD-10-CM | POA: Diagnosis not present

## 2021-11-26 DIAGNOSIS — N2581 Secondary hyperparathyroidism of renal origin: Secondary | ICD-10-CM | POA: Diagnosis not present

## 2021-11-26 DIAGNOSIS — Z992 Dependence on renal dialysis: Secondary | ICD-10-CM | POA: Diagnosis not present

## 2021-11-26 DIAGNOSIS — N186 End stage renal disease: Secondary | ICD-10-CM | POA: Diagnosis not present

## 2021-11-28 DIAGNOSIS — N2581 Secondary hyperparathyroidism of renal origin: Secondary | ICD-10-CM | POA: Diagnosis not present

## 2021-11-28 DIAGNOSIS — N186 End stage renal disease: Secondary | ICD-10-CM | POA: Diagnosis not present

## 2021-11-28 DIAGNOSIS — Z992 Dependence on renal dialysis: Secondary | ICD-10-CM | POA: Diagnosis not present

## 2021-11-30 DIAGNOSIS — N2581 Secondary hyperparathyroidism of renal origin: Secondary | ICD-10-CM | POA: Diagnosis not present

## 2021-11-30 DIAGNOSIS — Z992 Dependence on renal dialysis: Secondary | ICD-10-CM | POA: Diagnosis not present

## 2021-11-30 DIAGNOSIS — N186 End stage renal disease: Secondary | ICD-10-CM | POA: Diagnosis not present

## 2021-12-02 DIAGNOSIS — N186 End stage renal disease: Secondary | ICD-10-CM | POA: Diagnosis not present

## 2021-12-02 DIAGNOSIS — Z992 Dependence on renal dialysis: Secondary | ICD-10-CM | POA: Diagnosis not present

## 2021-12-02 DIAGNOSIS — I129 Hypertensive chronic kidney disease with stage 1 through stage 4 chronic kidney disease, or unspecified chronic kidney disease: Secondary | ICD-10-CM | POA: Diagnosis not present

## 2021-12-03 DIAGNOSIS — N186 End stage renal disease: Secondary | ICD-10-CM | POA: Diagnosis not present

## 2021-12-03 DIAGNOSIS — N2581 Secondary hyperparathyroidism of renal origin: Secondary | ICD-10-CM | POA: Diagnosis not present

## 2021-12-03 DIAGNOSIS — Z992 Dependence on renal dialysis: Secondary | ICD-10-CM | POA: Diagnosis not present

## 2021-12-05 DIAGNOSIS — N2581 Secondary hyperparathyroidism of renal origin: Secondary | ICD-10-CM | POA: Diagnosis not present

## 2021-12-05 DIAGNOSIS — Z992 Dependence on renal dialysis: Secondary | ICD-10-CM | POA: Diagnosis not present

## 2021-12-05 DIAGNOSIS — N186 End stage renal disease: Secondary | ICD-10-CM | POA: Diagnosis not present

## 2021-12-07 DIAGNOSIS — Z992 Dependence on renal dialysis: Secondary | ICD-10-CM | POA: Diagnosis not present

## 2021-12-07 DIAGNOSIS — N186 End stage renal disease: Secondary | ICD-10-CM | POA: Diagnosis not present

## 2021-12-07 DIAGNOSIS — N2581 Secondary hyperparathyroidism of renal origin: Secondary | ICD-10-CM | POA: Diagnosis not present

## 2021-12-10 DIAGNOSIS — N2581 Secondary hyperparathyroidism of renal origin: Secondary | ICD-10-CM | POA: Diagnosis not present

## 2021-12-10 DIAGNOSIS — Z992 Dependence on renal dialysis: Secondary | ICD-10-CM | POA: Diagnosis not present

## 2021-12-10 DIAGNOSIS — N186 End stage renal disease: Secondary | ICD-10-CM | POA: Diagnosis not present

## 2021-12-11 DIAGNOSIS — Z0000 Encounter for general adult medical examination without abnormal findings: Secondary | ICD-10-CM | POA: Diagnosis not present

## 2021-12-11 DIAGNOSIS — I5032 Chronic diastolic (congestive) heart failure: Secondary | ICD-10-CM | POA: Diagnosis not present

## 2021-12-11 DIAGNOSIS — N185 Chronic kidney disease, stage 5: Secondary | ICD-10-CM | POA: Diagnosis not present

## 2021-12-11 DIAGNOSIS — I10 Essential (primary) hypertension: Secondary | ICD-10-CM | POA: Diagnosis not present

## 2021-12-11 DIAGNOSIS — E1142 Type 2 diabetes mellitus with diabetic polyneuropathy: Secondary | ICD-10-CM | POA: Diagnosis not present

## 2021-12-12 DIAGNOSIS — Z992 Dependence on renal dialysis: Secondary | ICD-10-CM | POA: Diagnosis not present

## 2021-12-12 DIAGNOSIS — Z0000 Encounter for general adult medical examination without abnormal findings: Secondary | ICD-10-CM | POA: Diagnosis not present

## 2021-12-12 DIAGNOSIS — N2581 Secondary hyperparathyroidism of renal origin: Secondary | ICD-10-CM | POA: Diagnosis not present

## 2021-12-12 DIAGNOSIS — N186 End stage renal disease: Secondary | ICD-10-CM | POA: Diagnosis not present

## 2021-12-12 DIAGNOSIS — E1142 Type 2 diabetes mellitus with diabetic polyneuropathy: Secondary | ICD-10-CM | POA: Diagnosis not present

## 2021-12-14 DIAGNOSIS — N186 End stage renal disease: Secondary | ICD-10-CM | POA: Diagnosis not present

## 2021-12-14 DIAGNOSIS — Z992 Dependence on renal dialysis: Secondary | ICD-10-CM | POA: Diagnosis not present

## 2021-12-14 DIAGNOSIS — N2581 Secondary hyperparathyroidism of renal origin: Secondary | ICD-10-CM | POA: Diagnosis not present

## 2021-12-17 DIAGNOSIS — N186 End stage renal disease: Secondary | ICD-10-CM | POA: Diagnosis not present

## 2021-12-17 DIAGNOSIS — Z992 Dependence on renal dialysis: Secondary | ICD-10-CM | POA: Diagnosis not present

## 2021-12-17 DIAGNOSIS — N2581 Secondary hyperparathyroidism of renal origin: Secondary | ICD-10-CM | POA: Diagnosis not present

## 2021-12-19 DIAGNOSIS — Z992 Dependence on renal dialysis: Secondary | ICD-10-CM | POA: Diagnosis not present

## 2021-12-19 DIAGNOSIS — N2581 Secondary hyperparathyroidism of renal origin: Secondary | ICD-10-CM | POA: Diagnosis not present

## 2021-12-19 DIAGNOSIS — N186 End stage renal disease: Secondary | ICD-10-CM | POA: Diagnosis not present

## 2021-12-21 DIAGNOSIS — N2581 Secondary hyperparathyroidism of renal origin: Secondary | ICD-10-CM | POA: Diagnosis not present

## 2021-12-21 DIAGNOSIS — Z992 Dependence on renal dialysis: Secondary | ICD-10-CM | POA: Diagnosis not present

## 2021-12-21 DIAGNOSIS — N186 End stage renal disease: Secondary | ICD-10-CM | POA: Diagnosis not present

## 2021-12-24 DIAGNOSIS — N186 End stage renal disease: Secondary | ICD-10-CM | POA: Diagnosis not present

## 2021-12-24 DIAGNOSIS — N2581 Secondary hyperparathyroidism of renal origin: Secondary | ICD-10-CM | POA: Diagnosis not present

## 2021-12-24 DIAGNOSIS — Z992 Dependence on renal dialysis: Secondary | ICD-10-CM | POA: Diagnosis not present

## 2021-12-26 DIAGNOSIS — N186 End stage renal disease: Secondary | ICD-10-CM | POA: Diagnosis not present

## 2021-12-26 DIAGNOSIS — N2581 Secondary hyperparathyroidism of renal origin: Secondary | ICD-10-CM | POA: Diagnosis not present

## 2021-12-26 DIAGNOSIS — Z992 Dependence on renal dialysis: Secondary | ICD-10-CM | POA: Diagnosis not present

## 2021-12-28 DIAGNOSIS — N186 End stage renal disease: Secondary | ICD-10-CM | POA: Diagnosis not present

## 2021-12-28 DIAGNOSIS — N2581 Secondary hyperparathyroidism of renal origin: Secondary | ICD-10-CM | POA: Diagnosis not present

## 2021-12-28 DIAGNOSIS — Z992 Dependence on renal dialysis: Secondary | ICD-10-CM | POA: Diagnosis not present

## 2021-12-31 DIAGNOSIS — Z992 Dependence on renal dialysis: Secondary | ICD-10-CM | POA: Diagnosis not present

## 2021-12-31 DIAGNOSIS — N2581 Secondary hyperparathyroidism of renal origin: Secondary | ICD-10-CM | POA: Diagnosis not present

## 2021-12-31 DIAGNOSIS — N186 End stage renal disease: Secondary | ICD-10-CM | POA: Diagnosis not present

## 2022-01-02 DIAGNOSIS — I129 Hypertensive chronic kidney disease with stage 1 through stage 4 chronic kidney disease, or unspecified chronic kidney disease: Secondary | ICD-10-CM | POA: Diagnosis not present

## 2022-01-02 DIAGNOSIS — N186 End stage renal disease: Secondary | ICD-10-CM | POA: Diagnosis not present

## 2022-01-02 DIAGNOSIS — N2581 Secondary hyperparathyroidism of renal origin: Secondary | ICD-10-CM | POA: Diagnosis not present

## 2022-01-02 DIAGNOSIS — Z992 Dependence on renal dialysis: Secondary | ICD-10-CM | POA: Diagnosis not present

## 2022-04-15 NOTE — Progress Notes (Signed)
HPI: Follow-up chronic diastolic congestive heart failure.  Also with chronic stage IV kidney disease.  Nuclear study March 2020 showed ejection fraction 57%, diaphragmatic attenuation but no ischemia. PYP scan August 2021 not suggestive of transthyretin amyloidosis.  Renal Dopplers August 2021 showed no renal artery stenosis. Echocardiogram March 2023 shows normal LV function, mild left ventricular hypertrophy, grade 2 diastolic dysfunction.  Since last seen he denies dyspnea, chest pain, pedal edema or syncope.  He is having problems with hypotension on dialysis days.  Current Outpatient Medications  Medication Sig Dispense Refill   acetaminophen (TYLENOL) 500 MG tablet Take 500-1,000 mg by mouth every 6 (six) hours as needed for moderate pain or headache.     aspirin 81 MG chewable tablet Chew 81 mg by mouth daily.     blood glucose meter kit and supplies KIT Dispense based on patient and insurance preference. Use up to four times daily as directed. (FOR ICD-9 250.00, 250.01). 1 each 0   calcium acetate (PHOSLO) 667 MG capsule Take 1 capsule (667 mg total) by mouth 3 (three) times daily with meals. 90 capsule 1   calcium carbonate (TUMS - DOSED IN MG ELEMENTAL CALCIUM) 500 MG chewable tablet Chew 2 tablets by mouth at bedtime.     diclofenac Sodium (VOLTAREN) 1 % GEL Apply 2 g topically 4 (four) times daily. (Patient taking differently: Apply 2 g topically 4 (four) times daily as needed (pain).)     docusate sodium (COLACE) 100 MG capsule Take 1 capsule (100 mg total) by mouth 2 (two) times daily. 10 capsule 0   feeding supplement (ENSURE ENLIVE / ENSURE PLUS) LIQD Take 237 mLs by mouth 2 (two) times daily between meals. 237 mL 12   HYDROcodone-acetaminophen (NORCO/VICODIN) 5-325 MG tablet Take 1 tablet by mouth 2 (two) times daily as needed for moderate pain. 20 tablet 0   hydrOXYzine (ATARAX) 25 MG tablet Take by mouth.     insulin glargine, 2 Unit Dial, (TOUJEO MAX SOLOSTAR) 300 UNIT/ML  Solostar Pen Inject 18 Units into the skin every morning.     meclizine (ANTIVERT) 25 MG tablet Take 1 tablet (25 mg total) by mouth 3 (three) times daily. (Patient taking differently: Take 25 mg by mouth 2 (two) times daily.) 30 tablet 0   polyethylene glycol (MIRALAX / GLYCOLAX) 17 g packet Take 17 g by mouth daily. (Patient taking differently: Take 17 g by mouth daily as needed.) 14 each 0   senna-docusate (SENOKOT-S) 8.6-50 MG tablet Take 1 tablet by mouth 2 (two) times daily as needed for mild constipation.      Torsemide 40 MG TABS Take 40 mg by mouth every other day. Take 40 mg on Non- Dyalisis Days     calcitRIOL (ROCALTROL) 0.25 MCG capsule Take 0.25 mcg by mouth every other day. (Patient not taking: Reported on 10/18/2021)     metolazone (ZAROXOLYN) 5 MG tablet Take 5 mg by mouth every Sunday. (Patient not taking: Reported on 10/18/2021)     multivitamin (RENA-VIT) TABS tablet Take 1 tablet by mouth at bedtime. (Patient not taking: Reported on 04/25/2022)  0   No current facility-administered medications for this visit.     Past Medical History:  Diagnosis Date   Anemia in chronic kidney disease (CKD)    stage 5   Anxiety    Arthritis    CHF (congestive heart failure) ([place]) 05/28/2018   dx 05/28/18   Diabetes mellitus without complication (HCC)    Hyperlipidemia  Hypertension    Iron deficiency anemia    Neuromuscular disorder (HCC)    neuropathy feet/hands   Perforating neurotrophic ulcer of foot ([place])    Stage 4 chronic kidney disease ([place])     Past Surgical History:  Procedure Laterality Date   ACHILLES TENDON REPAIR Bilateral    AV FISTULA PLACEMENT Left 03/21/2021   Procedure: LEFT BRACHIOCEPHALIC ARTERIOVENOUS (AV) FISTULA CREATION;  Surgeon: Cherre Robins, MD;  Location: [facility];  Service: Vascular;  Laterality: Left;   BRONCHIAL BIOPSY  07/24/2021   Procedure: BRONCHIAL BIOPSIES;  Surgeon: Candee Furbish, MD;  Location: [facility] ENDOSCOPY;  Service: Pulmonary;;    BRONCHIAL BRUSHINGS Right 07/24/2021   Procedure: BRONCHIAL BRUSHINGS;  Surgeon: Candee Furbish, MD;  Location: [facility] ENDOSCOPY;  Service: Pulmonary;  Laterality: Right;   BRONCHIAL WASHINGS  07/24/2021   Procedure: BRONCHIAL WASHINGS;  Surgeon: Candee Furbish, MD;  Location: [place];  Service: Pulmonary;;   COLONOSCOPY     CYST EXCISION     lipoma removed from arm   IR THORACENTESIS ASP PLEURAL SPACE W/IMG GUIDE  12/26/2019   IR THORACENTESIS ASP PLEURAL SPACE W/IMG GUIDE  01/18/2020   THORACENTESIS  04/16/2020   VIDEO BRONCHOSCOPY Right 07/24/2021   Procedure: VIDEO BRONCHOSCOPY WITH FLUORO;  Surgeon: Candee Furbish, MD;  Location: [facility] ENDOSCOPY;  Service: Pulmonary;  Laterality: Right;    Social History   Socioeconomic History   Marital status: Married    Spouse name: Not on file   Number of children: Not on file   Years of education: Not on file   Highest education level: Not on file  Occupational History   Not on file  Tobacco Use   Smoking status: Former    Packs/day: 1.00    Years: 1.50    Total pack years: 1.50    Types: Cigarettes   Smokeless tobacco: Never   Tobacco comments:    smoked for only 1.5 years in the 100's  Vaping Use   Vaping Use: Never used  Substance and Sexual Activity   Alcohol use: No    Alcohol/week: 0.0 standard drinks of alcohol   Drug use: No   Sexual activity: Not on file  Other Topics Concern   Not on file  Social History Narrative   Not on file   Social Determinants of Health   Financial Resource Strain: Not on file  Food Insecurity: Not on file  Transportation Needs: Not on file  Physical Activity: Not on file  Stress: Not on file  Social Connections: Not on file  Intimate Partner Violence: Not on file    Family History  Problem Relation Age of Onset   Diabetes Mellitus II Mother    Hypertension Father     ROS: no fevers or chills, productive cough, hemoptysis, dysphasia, odynophagia, melena, hematochezia, dysuria,  hematuria, rash, seizure activity, orthopnea, PND, pedal edema, claudication. Remaining systems are negative.  Physical Exam: Well-developed well-nourished in no acute distress.  Skin is warm and dry.  HEENT is normal.  Neck is supple.  Chest is clear to auscultation with normal expansion.  Cardiovascular exam is regular rate and rhythm.  Abdominal exam nontender or distended. No masses palpated. Extremities show no edema. neuro grossly intact  A/P  1 chronic diastolic congestive heart failure-patient remains euvolemic on examination.  Volume status managed by dialysis.  2 hypertension-blood pressure is low on dialysis days.  He is not on any antihypertensives now that dialysis has been initiated.  3 ESRD-Per nephrology.  Now on dialysis.  4 hyperlipidemia-Per primary care.  Kirk Ruths, MD

## 2022-04-25 ENCOUNTER — Ambulatory Visit: Payer: Medicare HMO | Attending: Cardiology | Admitting: Cardiology

## 2022-04-25 ENCOUNTER — Encounter: Payer: Self-pay | Admitting: Cardiology

## 2022-04-25 VITALS — BP 130/68 | HR 91 | Ht 70.0 in | Wt 193.2 lb

## 2022-04-25 DIAGNOSIS — E7800 Pure hypercholesterolemia, unspecified: Secondary | ICD-10-CM | POA: Diagnosis not present

## 2022-04-25 DIAGNOSIS — I5032 Chronic diastolic (congestive) heart failure: Secondary | ICD-10-CM

## 2022-04-25 DIAGNOSIS — I10 Essential (primary) hypertension: Secondary | ICD-10-CM

## 2022-04-25 NOTE — Patient Instructions (Signed)
  Follow-Up: At [facility], you and your health needs are our priority.  As part of our continuing mission to provide you with exceptional heart care, we have created designated Provider Care Teams.  These Care Teams include your primary Cardiologist (physician) and Advanced Practice Providers (APPs -  Physician Assistants and Nurse Practitioners) who all work together to provide you with the care you need, when you need it.  We recommend signing up for the patient portal called "MyChart".  Sign up information is provided on this After Visit Summary.  MyChart is used to connect with patients for Virtual Visits (Telemedicine).  Patients are able to view lab/test results, encounter notes, upcoming appointments, etc.  Non-urgent messages can be sent to your provider as well.   To learn more about what you can do with MyChart, go to [email or web address].    Your next appointment:   6 month(s)  The format for your next appointment:   In Person  Provider:   Brian Crenshaw, MD    

## 2022-07-02 ENCOUNTER — Ambulatory Visit: Payer: Medicare HMO | Admitting: Podiatry

## 2022-07-02 ENCOUNTER — Encounter: Payer: Self-pay | Admitting: Podiatry

## 2022-07-02 DIAGNOSIS — E1100 Type 2 diabetes mellitus with hyperosmolarity without nonketotic hyperglycemic-hyperosmolar coma (NKHHC): Secondary | ICD-10-CM | POA: Diagnosis not present

## 2022-07-02 DIAGNOSIS — M79675 Pain in left toe(s): Secondary | ICD-10-CM

## 2022-07-02 DIAGNOSIS — M79674 Pain in right toe(s): Secondary | ICD-10-CM

## 2022-07-02 DIAGNOSIS — B351 Tinea unguium: Secondary | ICD-10-CM

## 2022-07-02 NOTE — Progress Notes (Addendum)
This patient returns to my office for at risk foot care.  This patient requires this care by a professional since this patient will be at risk due to having diabetes and CKD.  This patient is unable to cut nails himself since the patient cannot reach his nails.These nails are painful walking and wearing shoes.  This patient presents for at risk foot care today.  General Appearance  Alert, conversant and in no acute stress.  Vascular  Dorsalis pedis and posterior tibial  pulses are  weakly palpable  bilaterally.  Capillary return is within normal limits  bilaterally. Temperature is within normal limits  bilaterally.  Neurologic  Senn-Weinstein monofilament wire test within normal limits  bilaterally. Muscle power within normal limits bilaterally.  Nails Thick disfigured discolored nails with subungual debris  from hallux to fifth toes bilaterally. No evidence of bacterial infection or drainage bilaterally.  Orthopedic  No limitations of motion  feet .  No crepitus or effusions noted.  No bony pathology or digital deformities noted.  Skin  normotropic skin with no porokeratosis noted bilaterally.  No signs of infections or ulcers noted.     Onychomycosis  Pain in right toes  Pain in left toes  Consent was obtained for treatment procedures.   Mechanical debridement of nails 1-5  bilaterally performed with a nail nipper.  Filed with dremel without incident.    Return office visit   4 months                   Told patient to return for periodic foot care and evaluation due to potential at risk complications.   Gardiner Barefoot DPM

## 2022-08-14 ENCOUNTER — Encounter (HOSPITAL_COMMUNITY): Payer: Self-pay

## 2022-08-14 ENCOUNTER — Emergency Department (HOSPITAL_COMMUNITY): Payer: Medicare HMO

## 2022-08-14 ENCOUNTER — Emergency Department (HOSPITAL_COMMUNITY)
Admission: EM | Admit: 2022-08-14 | Discharge: 2022-08-14 | Disposition: A | Discharge status: Home / Self Care | Payer: Medicare HMO | Attending: Emergency Medicine | Admitting: Emergency Medicine

## 2022-08-14 DIAGNOSIS — Z992 Dependence on renal dialysis: Secondary | ICD-10-CM | POA: Diagnosis not present

## 2022-08-14 DIAGNOSIS — E1122 Type 2 diabetes mellitus with diabetic chronic kidney disease: Secondary | ICD-10-CM | POA: Insufficient documentation

## 2022-08-14 DIAGNOSIS — Z7982 Long term (current) use of aspirin: Secondary | ICD-10-CM | POA: Diagnosis not present

## 2022-08-14 DIAGNOSIS — R1031 Right lower quadrant pain: Secondary | ICD-10-CM | POA: Diagnosis present

## 2022-08-14 DIAGNOSIS — K4090 Unilateral inguinal hernia, without obstruction or gangrene, not specified as recurrent: Secondary | ICD-10-CM

## 2022-08-14 DIAGNOSIS — Z794 Long term (current) use of insulin: Secondary | ICD-10-CM | POA: Diagnosis not present

## 2022-08-14 DIAGNOSIS — N186 End stage renal disease: Secondary | ICD-10-CM | POA: Insufficient documentation

## 2022-08-14 DIAGNOSIS — I132 Hypertensive heart and chronic kidney disease with heart failure and with stage 5 chronic kidney disease, or end stage renal disease: Secondary | ICD-10-CM | POA: Diagnosis not present

## 2022-08-14 DIAGNOSIS — Z79899 Other long term (current) drug therapy: Secondary | ICD-10-CM | POA: Insufficient documentation

## 2022-08-14 DIAGNOSIS — R030 Elevated blood-pressure reading, without diagnosis of hypertension: Secondary | ICD-10-CM

## 2022-08-14 DIAGNOSIS — I509 Heart failure, unspecified: Secondary | ICD-10-CM | POA: Insufficient documentation

## 2022-08-14 LAB — COMPREHENSIVE METABOLIC PANEL
ALT: 13 U/L (ref 0–44)
AST: 18 U/L (ref 15–41)
Albumin: 3.5 g/dL (ref 3.5–5.0)
Alkaline Phosphatase: 62 U/L (ref 38–126)
Anion gap: 11 (ref 5–15)
BUN: 14 mg/dL (ref 8–23)
CO2: 30 mmol/L (ref 22–32)
Calcium: 8 mg/dL — ABNORMAL LOW (ref 8.9–10.3)
Chloride: 95 mmol/L — ABNORMAL LOW (ref 98–111)
Creatinine, Ser: 2.01 mg/dL — ABNORMAL HIGH (ref 0.61–1.24)
GFR, Estimated: 35 mL/min — ABNORMAL LOW (ref >60)
Glucose, Bld: 142 mg/dL — ABNORMAL HIGH (ref 70–99)
Potassium: 3.2 mmol/L — ABNORMAL LOW (ref 3.5–5.1)
Sodium: 136 mmol/L (ref 135–145)
Total Bilirubin: 0.7 mg/dL (ref 0.3–1.2)
Total Protein: 8.3 g/dL — ABNORMAL HIGH (ref 6.5–8.1)

## 2022-08-14 LAB — CBC WITH DIFFERENTIAL/PLATELET
Abs Immature Granulocytes: 0.04 10*3/uL (ref 0.00–0.07)
Basophils Absolute: 0.1 10*3/uL (ref 0.0–0.1)
Basophils Relative: 1 %
Eosinophils Absolute: 0.3 10*3/uL (ref 0.0–0.5)
Eosinophils Relative: 4 %
HCT: 39.3 % (ref 39.0–52.0)
Hemoglobin: 12.8 g/dL — ABNORMAL LOW (ref 13.0–17.0)
Immature Granulocytes: 1 %
Lymphocytes Relative: 35 %
Lymphs Abs: 2.8 10*3/uL (ref 0.7–4.0)
MCH: 29 pg (ref 26.0–34.0)
MCHC: 32.6 g/dL (ref 30.0–36.0)
MCV: 88.9 fL (ref 80.0–100.0)
Monocytes Absolute: 0.6 10*3/uL (ref 0.1–1.0)
Monocytes Relative: 8 %
Neutro Abs: 4.2 10*3/uL (ref 1.7–7.7)
Neutrophils Relative %: 51 %
Platelets: 191 10*3/uL (ref 150–400)
RBC: 4.42 MIL/uL (ref 4.22–5.81)
RDW: 13.4 % (ref 11.5–15.5)
WBC: 8 10*3/uL (ref 4.0–10.5)
nRBC: 0 % (ref 0.0–0.2)

## 2022-08-14 LAB — LIPASE, BLOOD: Lipase: 51 U/L (ref 11–51)

## 2022-08-14 MED ORDER — OXYCODONE-ACETAMINOPHEN 5-325 MG PO TABS
2.0000 | ORAL_TABLET | Freq: Once | ORAL | Status: AC
  Administered 2022-08-14: 2 via ORAL
  Filled 2022-08-14: qty 2

## 2022-08-14 MED ORDER — IOHEXOL 350 MG/ML SOLN
75.0000 mL | Freq: Once | INTRAVENOUS | Status: AC | PRN
  Administered 2022-08-14: 75 mL via INTRAVENOUS

## 2022-08-14 NOTE — ED Provider Notes (Signed)
Signed out by earlier team that hernia is reduced, and ct is pending.    CT neg for acute process. Abd soft non tender. No vomiting. Pt eating/drinking.   Rec outpatient gen surg f/u.  Return precautions provided.      Cathren Laine, MD 08/14/22 2027

## 2022-08-14 NOTE — ED Provider Notes (Signed)
[facility] [place] Provider Note   CSN: [id number] Arrival date & time: 08/14/22  1202     History  No chief complaint on file.   Melvin Mcclure is a 72 y.o. male.  Patient with history of ESRD on hemodialysis, htn, T2DM, CHF, presents today with complaints of right groin pain. He states that same has been ongoing for 1 week and got worse today. Told the dialysis nurse today when he was receiving treatment who felt the area and suspected he had a hernia and recommended he come here for evaluation. He has never been told he had a hernia in this area before. States that the triage provider was able to reduce the hernia without issue and he did have some pain relief, however states that he is still quite uncomfortable despite reduction of his hernia. He did complete his session of dialysis today. Denies nausea, vomiting, or diarrhea. Has been having regular bowel movements. Denies urinary symptoms. Of note, patient has been on dialysis for about a year now.  The history is provided by the patient. No language interpreter was used.       Home Medications Prior to Admission medications   Medication Sig Start Date End Date Taking? Authorizing Provider  acetaminophen (TYLENOL) 500 MG tablet Take 500-1,000 mg by mouth every 6 (six) hours as needed for moderate pain or headache.    [provider]  aspirin 81 MG chewable tablet Chew 81 mg by mouth daily. 08/29/21   [provider]  blood glucose meter kit and supplies KIT Dispense based on patient and insurance preference. Use up to four times daily as directed. (FOR ICD-9 250.00, 250.01). 04/21/20   Kathlen ModyAkula, Vijaya, MD  calcitRIOL (ROCALTROL) 0.25 MCG capsule Take 0.25 mcg by mouth every other day. Patient not taking: Reported on 10/18/2021 10/18/19   [provider]  calcium acetate (PHOSLO) 667 MG capsule Take 1 capsule (667 mg total) by mouth 3 (three) times daily with meals.  04/21/20   Kathlen ModyAkula, Vijaya, MD  calcium carbonate (TUMS - DOSED IN MG ELEMENTAL CALCIUM) 500 MG chewable tablet Chew 2 tablets by mouth at bedtime.    [provider]  diclofenac Sodium (VOLTAREN) 1 % GEL Apply 2 g topically 4 (four) times daily. Patient taking differently: Apply 2 g topically 4 (four) times daily as needed (pain). 12/31/19   Joseph ArtVann, Jessica U, DO  docusate sodium (COLACE) 100 MG capsule Take 1 capsule (100 mg total) by mouth 2 (two) times daily. 08/11/21   Lorin Glassahal, Binaya, MD  feeding supplement (ENSURE ENLIVE / ENSURE PLUS) LIQD Take 237 mLs by mouth 2 (two) times daily between meals. 08/11/21   Lorin Glassahal, Binaya, MD  hydrOXYzine (ATARAX) 25 MG tablet Take by mouth. 08/29/21   [provider]  insulin glargine, 2 Unit Dial, (TOUJEO MAX SOLOSTAR) 300 UNIT/ML Solostar Pen Inject 18 Units into the skin every morning. 08/12/21   Osvaldo ShipperKrishnan, Gokul, MD  meclizine (ANTIVERT) 25 MG tablet Take 1 tablet (25 mg total) by mouth 3 (three) times daily. Patient taking differently: Take 25 mg by mouth 2 (two) times daily. 12/31/19   Joseph ArtVann, Jessica U, DO  metolazone (ZAROXOLYN) 5 MG tablet Take 5 mg by mouth every Sunday. Patient not taking: Reported on 10/18/2021    [provider]  multivitamin (RENA-VIT) TABS tablet Take 1 tablet by mouth at bedtime. Patient not taking: Reported on 04/25/2022 08/11/21   Lorin Glassahal, Binaya, MD  polyethylene glycol (MIRALAX / GLYCOLAX) 17 g  packet Take 17 g by mouth daily. Patient taking differently: Take 17 g by mouth daily as needed. 08/12/21   Dahal, Melina Schools, MD  senna-docusate (SENOKOT-S) 8.6-50 MG tablet Take 1 tablet by mouth 2 (two) times daily as needed for mild constipation.     [provider]  Torsemide 40 MG TABS Take 40 mg by mouth every other day. Take 40 mg on Non- Dyalisis Days    [provider]      Allergies    Patient has no known allergies.    Review of Systems   Review of Systems  Gastrointestinal:  Positive for  abdominal pain.  All other systems reviewed and are negative.   Physical Exam Updated Vital Signs BP (!) 162/85   Pulse 72   Temp (!) 97.2 F (36.2 C)   Resp 17   Ht 5\' 11"  (1.803 m)   Wt 82.5 kg   SpO2 99%   BMI 25.37 kg/m  Physical Exam Vitals and nursing note reviewed.  Constitutional:      General: He is not in acute distress.    Appearance: Normal appearance. He is normal weight. He is not ill-appearing, toxic-appearing or diaphoretic.  HENT:     Head: Normocephalic and atraumatic.  Cardiovascular:     Rate and Rhythm: Normal rate.  Pulmonary:     Effort: Pulmonary effort is normal. No respiratory distress.  Abdominal:     General: Abdomen is flat.     Palpations: Abdomen is soft.     Comments: Hernia reduced prior to my evaluation and is no longer present, however patients right inguinal area is still quite tender to palpation. No overlying skin changes.  Genitourinary:    Comments: Testes nontender bilaterally. No hernia present within the scrotum. Musculoskeletal:        General: Normal range of motion.     Cervical back: Normal range of motion.  Skin:    General: Skin is warm and dry.  Neurological:     General: No focal deficit present.     Mental Status: He is alert.  Psychiatric:        Mood and Affect: Mood normal.        Behavior: Behavior normal.     ED Results / Procedures / Treatments   Labs (all labs ordered are listed, but only abnormal results are displayed) Labs Reviewed  CBC WITH DIFFERENTIAL/PLATELET - Abnormal; Notable for the following components:      Result Value   Hemoglobin 12.8 (*)    All other components within normal limits  COMPREHENSIVE METABOLIC PANEL - Abnormal; Notable for the following components:   Potassium 3.2 (*)    Chloride 95 (*)    Glucose, Bld 142 (*)    Creatinine, Ser 2.01 (*)    Calcium 8.0 (*)    Total Protein 8.3 (*)    GFR, Estimated 35 (*)    All other components within normal limits  LIPASE, BLOOD   URINALYSIS, ROUTINE W REFLEX MICROSCOPIC    EKG None  Radiology No results found.  Procedures Procedures    Medications Ordered in ED Medications  oxyCODONE-acetaminophen (PERCOCET/ROXICET) 5-325 MG per tablet 2 tablet (2 tablets Oral Given 08/14/22 1307)    ED Course/ Medical Decision Making/ A&P                             Medical Decision Making Amount and/or Complexity of Data Reviewed Radiology: ordered.  Risk  Prescription drug management.   This patient is a 72 y.o. male who presents to the ED for concern of abdominal pain, this involves an extensive number of treatment options, and is a complaint that carries with it a high risk of complications and morbidity. The emergent differential diagnosis prior to evaluation includes, but is not limited to,  incarcerated hernia, strangulated hernia, uncomplicated hernia . This is not an exhaustive differential.   Past Medical History / Co-morbidities / Social History: history of ESRD on hemodialysis, htn, T2DM, CHF  Additional history: Chart reviewed. Pertinent results include: patient completed dialysis today  Physical Exam: Physical exam performed. The pertinent findings include: right inguinal fold tender to palpation  Lab Tests: I ordered, and personally interpreted labs.  The pertinent results include:  no leukocytosis, K 3.2, kidney function improved from previous   Imaging Studies: I ordered imaging studies including CT abdomen pelvis with contrast. Study pending at shift change   Medications: I ordered medication including percocet  for pain. Reevaluation of the patient after these medicines showed that the patient improved. I have reviewed the patients home medicines and have made adjustments as needed.    Disposition:  Patient presents today with complaints of right groin pain x 1 week. Hernia was apparently present to same, reduced by triage provider. However, following this patient continues to have pain  and TTP over the right groin where he states his hernia is. No hernia is present on my evaluation. Given his residual pain despite reduction, CT imaging obtained for evaluation and will determine dispo. If normal, patient can likely be discharged with outpatient surgery consult. Patient is understanding and in agreement.  Care handoff to EDP Dr. Denton Lank at shift change. Please see their note for continued evaluation and dispo.   Final Clinical Impression(s) / ED Diagnoses Final diagnoses:  Right inguinal hernia  Elevated blood pressure reading    Rx / DC Orders ED Discharge Orders     None         Vear Clock 08/15/22 1610    Cathren Laine, MD 08/15/22 [id number]

## 2022-08-14 NOTE — ED Triage Notes (Signed)
Pt just left dialysis, started having severe R sided abd pain; was told he has a hernia; diagnosed with shingles and pneumonia last week; started on gabapentin and antibiotics

## 2022-08-14 NOTE — ED Provider Triage Note (Signed)
Emergency Medicine Provider Triage Evaluation Note  Melvin Mcclure , a 72 y.o. male  was evaluated in triage.  Pt complains of abd pain. Pt has a R inguinal hernia for years but today it pops out causing a lot of pain.  Felt lightheaded and dizzy from pain.  No nausea or vomiting.  Did not finish dialysis today  Review of Systems  Positive: As above Negative: As above  Physical Exam  BP (!) 164/79 (BP Location: Right Arm)   Pulse 82   Temp (!) 97.2 F (36.2 C)   Resp 16   SpO2 99%  Gen:   Awake, no distress   Resp:  Normal effort  MSK:   Moves extremities without difficulty  Other:  R inguinal hernia, ttp  Medical Decision Making  Medically screening exam initiated at 12:44 PM.  Appropriate orders placed.  Melvin Mcclure was informed that the remainder of the evaluation will be completed by another provider, this initial triage assessment does not replace that evaluation, and the importance of remaining in the ED until their evaluation is complete.  Has R inguinal hernia that I attempt to reduce with success.     Melvin Helper, PA-C 08/14/22 1251

## 2022-08-14 NOTE — Discharge Instructions (Signed)
It was our pleasure to provide your ER care today - we hope that you feel better.  For hernia, follow up with general surgeon as outpatient - see referral - call office for appointment.   Take acetaminophen as need.  If constipation, try colace (stool softener) 2x/day,  and miralax (laxative) once per day as need.   Return to ER if worse, new symptoms, fevers, worsening or severe abdominal pain, persistent vomiting, or other concern.

## 2022-08-19 ENCOUNTER — Emergency Department (HOSPITAL_BASED_OUTPATIENT_CLINIC_OR_DEPARTMENT_OTHER)
Admission: EM | Admit: 2022-08-19 | Discharge: 2022-08-19 | Disposition: A | Discharge status: Home / Self Care | Payer: Medicare HMO | Attending: Emergency Medicine | Admitting: Emergency Medicine

## 2022-08-19 ENCOUNTER — Other Ambulatory Visit: Payer: Self-pay

## 2022-08-19 ENCOUNTER — Encounter (HOSPITAL_BASED_OUTPATIENT_CLINIC_OR_DEPARTMENT_OTHER): Payer: Self-pay

## 2022-08-19 DIAGNOSIS — I132 Hypertensive heart and chronic kidney disease with heart failure and with stage 5 chronic kidney disease, or end stage renal disease: Secondary | ICD-10-CM | POA: Insufficient documentation

## 2022-08-19 DIAGNOSIS — R1031 Right lower quadrant pain: Secondary | ICD-10-CM | POA: Diagnosis present

## 2022-08-19 DIAGNOSIS — I509 Heart failure, unspecified: Secondary | ICD-10-CM | POA: Diagnosis not present

## 2022-08-19 DIAGNOSIS — Z992 Dependence on renal dialysis: Secondary | ICD-10-CM | POA: Diagnosis not present

## 2022-08-19 DIAGNOSIS — E119 Type 2 diabetes mellitus without complications: Secondary | ICD-10-CM | POA: Diagnosis not present

## 2022-08-19 DIAGNOSIS — K4090 Unilateral inguinal hernia, without obstruction or gangrene, not specified as recurrent: Secondary | ICD-10-CM

## 2022-08-19 DIAGNOSIS — N186 End stage renal disease: Secondary | ICD-10-CM | POA: Insufficient documentation

## 2022-08-19 DIAGNOSIS — Z7982 Long term (current) use of aspirin: Secondary | ICD-10-CM | POA: Insufficient documentation

## 2022-08-19 DIAGNOSIS — Z794 Long term (current) use of insulin: Secondary | ICD-10-CM | POA: Diagnosis not present

## 2022-08-19 LAB — COMPREHENSIVE METABOLIC PANEL
ALT: 8 U/L (ref 0–44)
AST: 14 U/L — ABNORMAL LOW (ref 15–41)
Albumin: 3.8 g/dL (ref 3.5–5.0)
Alkaline Phosphatase: 67 U/L (ref 38–126)
Anion gap: 10 (ref 5–15)
BUN: 19 mg/dL (ref 8–23)
CO2: 24 mmol/L (ref 22–32)
Calcium: 9.3 mg/dL (ref 8.9–10.3)
Chloride: 102 mmol/L (ref 98–111)
Creatinine, Ser: 5.24 mg/dL — ABNORMAL HIGH (ref 0.61–1.24)
GFR, Estimated: 11 mL/min — ABNORMAL LOW (ref >60)
Glucose, Bld: 99 mg/dL (ref 70–99)
Potassium: 4.2 mmol/L (ref 3.5–5.1)
Sodium: 136 mmol/L (ref 135–145)
Total Bilirubin: 0.3 mg/dL (ref 0.3–1.2)
Total Protein: 8.1 g/dL (ref 6.5–8.1)

## 2022-08-19 LAB — CBC
HCT: 37.4 % — ABNORMAL LOW (ref 39.0–52.0)
Hemoglobin: 12.3 g/dL — ABNORMAL LOW (ref 13.0–17.0)
MCH: 29.4 pg (ref 26.0–34.0)
MCHC: 32.9 g/dL (ref 30.0–36.0)
MCV: 89.3 fL (ref 80.0–100.0)
Platelets: 179 10*3/uL (ref 150–400)
RBC: 4.19 MIL/uL — ABNORMAL LOW (ref 4.22–5.81)
RDW: 13.9 % (ref 11.5–15.5)
WBC: 5.4 10*3/uL (ref 4.0–10.5)
nRBC: 0 % (ref 0.0–0.2)

## 2022-08-19 LAB — LIPASE, BLOOD: Lipase: 46 U/L (ref 11–51)

## 2022-08-19 NOTE — ED Notes (Signed)
Patient verbalizes understanding of discharge instructions. Opportunity for questioning and answers were provided. Pt discharged from ED to home via POV with wife. Ambulatory at discharge.

## 2022-08-19 NOTE — ED Triage Notes (Signed)
Pt reports hernia pain. Pt states last week it was out and the PA put it back in. Pt states he has a surgery appt in May. They called the office today and they were unable to see him. Pt reports abdomen pain.   Dialysis TTS

## 2022-08-19 NOTE — Discharge Instructions (Addendum)
On exam your hernia is reduced, your abdominal pain has resolved and your abdomen is soft, nontender nondistended, your laboratory evaluation was reassuring.  Recommend follow-up outpatient with general surgery for elective repair of your inguinal hernia.

## 2022-08-19 NOTE — ED Notes (Signed)
Pt in restroom at this time attempting to provide specimen for UA. Aware of need to change into gown upon return to room .

## 2022-08-19 NOTE — ED Provider Notes (Addendum)
[place] EMERGENCY DEPARTMENT AT [facility] Provider Note   CSN: [id number] Arrival date & time: 08/19/22  1811     History  Chief Complaint  Patient presents with   Abdominal Pain    Melvin Mcclure is a 72 y.o. male.   Abdominal Pain Associated symptoms: nausea      72 year old male with medical history significant for DM2, HTN, HLD, CHF, ESRD on HD who presents to the emergency department with right inguinal hernia pain.  The patient has a known right-sided inguinal hernia and follows up outpatient with general surgery.  He was seen by a PA in clinic last Thursday who reduced his inguinal hernia.  He states that today during dialysis he developed right groin pain in the vicinity of his hernia.  The pain has since resolved.  He endorsed mild nausea which has also resolved.  He is passing gas.  He his last bowel movement was yesterday and was normal.  He states that he called the surgery office and they are unable to get him in to be seen and so he presents to the emergency department for further evaluation.  Home Medications Prior to Admission medications   Medication Sig Start Date End Date Taking? Authorizing Provider  acetaminophen (TYLENOL) 500 MG tablet Take 500-1,000 mg by mouth every 6 (six) hours as needed for moderate pain or headache.    [provider]  aspirin 81 MG chewable tablet Chew 81 mg by mouth daily. 08/29/21   [provider]  blood glucose meter kit and supplies KIT Dispense based on patient and insurance preference. Use up to four times daily as directed. (FOR ICD-9 250.00, 250.01). 04/21/20   Kathlen Mody, MD  calcitRIOL (ROCALTROL) 0.25 MCG capsule Take 0.25 mcg by mouth every other day. Patient not taking: Reported on 10/18/2021 10/18/19   [provider]  calcium acetate (PHOSLO) 667 MG capsule Take 1 capsule (667 mg total) by mouth 3 (three) times daily with meals. 04/21/20   Kathlen Mody, MD  calcium carbonate (TUMS  - DOSED IN MG ELEMENTAL CALCIUM) 500 MG chewable tablet Chew 2 tablets by mouth at bedtime.    [provider]  diclofenac Sodium (VOLTAREN) 1 % GEL Apply 2 g topically 4 (four) times daily. Patient taking differently: Apply 2 g topically 4 (four) times daily as needed (pain). 12/31/19   Joseph Art, DO  docusate sodium (COLACE) 100 MG capsule Take 1 capsule (100 mg total) by mouth 2 (two) times daily. 08/11/21   Lorin Glass, MD  feeding supplement (ENSURE ENLIVE / ENSURE PLUS) LIQD Take 237 mLs by mouth 2 (two) times daily between meals. 08/11/21   Lorin Glass, MD  hydrOXYzine (ATARAX) 25 MG tablet Take by mouth. 08/29/21   [provider]  insulin glargine, 2 Unit Dial, (TOUJEO MAX SOLOSTAR) 300 UNIT/ML Solostar Pen Inject 18 Units into the skin every morning. 08/12/21   Osvaldo Shipper, MD  meclizine (ANTIVERT) 25 MG tablet Take 1 tablet (25 mg total) by mouth 3 (three) times daily. Patient taking differently: Take 25 mg by mouth 2 (two) times daily. 12/31/19   Joseph Art, DO  metolazone (ZAROXOLYN) 5 MG tablet Take 5 mg by mouth every Sunday. Patient not taking: Reported on 10/18/2021    [provider]  multivitamin (RENA-VIT) TABS tablet Take 1 tablet by mouth at bedtime. Patient not taking: Reported on 04/25/2022 08/11/21   Lorin Glass, MD  polyethylene glycol (MIRALAX / GLYCOLAX) 17 g packet Take 17 g  by mouth daily. Patient taking differently: Take 17 g by mouth daily as needed. 08/12/21   Dahal, Melina Schools, MD  senna-docusate (SENOKOT-S) 8.6-50 MG tablet Take 1 tablet by mouth 2 (two) times daily as needed for mild constipation.     [provider]  Torsemide 40 MG TABS Take 40 mg by mouth every other day. Take 40 mg on Non- Dyalisis Days    [provider]      Allergies    Patient has no known allergies.    Review of Systems   Review of Systems  Gastrointestinal:  Positive for abdominal pain and nausea.  All other systems reviewed and  are negative.   Physical Exam Updated Vital Signs BP (!) 161/81   Pulse 75   Temp 98.3 F (36.8 C)   Resp 16   Ht 5\' 11"  (1.803 m)   Wt 83.9 kg   SpO2 94%   BMI 25.80 kg/m  Physical Exam Vitals and nursing note reviewed.  Constitutional:      General: He is not in acute distress.    Appearance: He is well-developed.  HENT:     Head: Normocephalic and atraumatic.  Eyes:     Conjunctiva/sclera: Conjunctivae normal.  Cardiovascular:     Rate and Rhythm: Normal rate and regular rhythm.     Heart sounds: No murmur heard. Pulmonary:     Effort: Pulmonary effort is normal. No respiratory distress.     Breath sounds: Normal breath sounds.  Abdominal:     Palpations: Abdomen is soft.     Tenderness: There is no abdominal tenderness. There is no guarding or rebound.     Comments: Abdomen is soft, nontender, nondistended, no rebound or guarding  Genitourinary:    Comments: Right-sided inguinal hernia palpated, easily reduced. Was almost completely reduced on my evaluation Musculoskeletal:        General: No swelling.     Cervical back: Neck supple.  Skin:    General: Skin is warm and dry.     Capillary Refill: Capillary refill takes less than 2 seconds.  Neurological:     Mental Status: He is alert.  Psychiatric:        Mood and Affect: Mood normal.     ED Results / Procedures / Treatments   Labs (all labs ordered are listed, but only abnormal results are displayed) Labs Reviewed  COMPREHENSIVE METABOLIC PANEL - Abnormal; Notable for the following components:      Result Value   Creatinine, Ser 5.24 (*)    AST 14 (*)    GFR, Estimated 11 (*)    All other components within normal limits  CBC - Abnormal; Notable for the following components:   RBC 4.19 (*)    Hemoglobin 12.3 (*)    HCT 37.4 (*)    All other components within normal limits  LIPASE, BLOOD    EKG None  Radiology No results found.  Procedures Procedures    Medications Ordered in  ED Medications - No data to display  ED Course/ Medical Decision Making/ A&P                             Medical Decision Making Amount and/or Complexity of Data Reviewed Labs: ordered.     72 year old male with medical history significant for DM2, HTN, HLD, CHF, ESRD on HD who presents to the emergency department with right inguinal hernia pain.  The patient has a known  right-sided inguinal hernia and follows up outpatient with general surgery.  He was seen by a PA in clinic last Thursday who reduced his inguinal hernia.  He states that today during dialysis he developed right groin pain in the vicinity of his hernia.  The pain has since resolved.  He endorsed mild nausea which has also resolved.  He is passing gas.  He his last bowel movement was yesterday and was normal.  He states that he called the surgery office and they are unable to get him in to be seen and so he presents to the emergency department for further evaluation.  On arrival, the patient was afebrile, not tachycardic or tachypneic, BP 146/87, saturating her percent on room air.  Sinus rhythm noted on cardiac telemetry.  Physical exam significant for right sided inguinal hernia, easily reduced.  Patient had an abdomen that was soft, nontender, nondistended, no rebound or guarding.  His pain and nausea has since resolved.  He presented hoping for urgent surgical management of his inguinal hernia.  I explained to the patient that at this time there is no emergent indication for surgery.  After evaluation significant for CMP without significant electrolyte abnormality, serum creatinine elevated to 5.24, CBC without a leukocytosis, mild anemia to 12.3, lipase normal.  Patient without genitourinary symptoms.  Overall symptomatically improved with a reassuring abdominal exam.  I do not think further diagnostic testing is indicated at this time given his presentation and reassuring workup.  Advised outpatient follow-up with general surgery  and provided strict return precautions the event of signs and symptoms of incarcerated or strangulated hernia.   The patient has been appropriately medically screened and/or stabilized in the ED. I have low suspicion for any other emergent medical condition which would require further screening, evaluation or treatment in the ED or require inpatient management.    Final Clinical Impression(s) / ED Diagnoses Final diagnoses:  Right inguinal hernia  Right lower quadrant abdominal pain    Rx / DC Orders ED Discharge Orders     None         Ernie Avena, MD 08/19/22 2122    Ernie Avena, MD 08/19/22 2123

## 2022-08-29 ENCOUNTER — Ambulatory Visit: Payer: Medicare HMO | Admitting: Nurse Practitioner

## 2022-09-03 ENCOUNTER — Ambulatory Visit: Payer: Medicare HMO | Admitting: Internal Medicine

## 2022-09-03 ENCOUNTER — Telehealth: Payer: Self-pay | Admitting: *Deleted

## 2022-09-03 NOTE — Telephone Encounter (Signed)
Scheduler called me and asked about appt with Bernadene Person, NP for tomorrow. She tells me the pt was on the line calling in stating he cannot make the appt tomorrow as he has dialysis. Operator said she was not sure if she should cancel the appt 09/04/22 with NP as it was for pre op clearance. I said fine to cancel the appt as we do not even have a clearance request yet. I said the first thing we need is to get get the clearance request and then we can reschedule appt for pre op clearance.

## 2022-09-03 NOTE — Telephone Encounter (Signed)
   Pre-operative Risk Assessment    Patient Name: Melvin Mcclure  DOB: [birth year] MRN: [id number]      Request for Surgical Clearance    Procedure:   INGUINAL HERNIA REPAIR  Date of Surgery:  Clearance TBD                                 Surgeon:  DR. Gaynelle Adu Surgeon's Group or Practice Name:  [facility] Phone number:  [phone] Fax number: [phone] ATTN: [name], CMA  Type of Clearance Requested:   - Medical ; ASA    Type of Anesthesia:  General    Additional requests/questions:    [name]   09/03/2022, 2:22 PM

## 2022-09-03 NOTE — Telephone Encounter (Signed)
I s/w the pt's wife about appt. They preferred in office as pt's wife states the pt is HOH. Pt has been scheduled to see Marjie Skiff , [facility] 09/10/22 @ 8:50. I will update all parties involved. Pt's wife thanked me for the help and the call back.

## 2022-09-03 NOTE — Telephone Encounter (Signed)
   Name: Melvin Mcclure  DOB: 03-26-1951  MRN: [id number]  Primary Cardiologist: Olga Millers, MD   Preoperative team, please contact this patient and set up a phone call appointment for further preoperative risk assessment. Please obtain consent and complete medication review. Thank you for your help.  I confirm that guidance regarding antiplatelet and oral anticoagulation therapy has been completed and, if necessary, noted below.  Regarding ASA therapy, we recommend continuation of ASA throughout the perioperative period.  However, if the surgeon feels that cessation of ASA is required in the perioperative period, it may be stopped 5-7 days prior to surgery with a plan to resume it as soon as felt to be feasible from a surgical standpoint in the post-operative period.    Napoleon Form, Leodis Rains, NP 09/03/2022, 2:41 PM [facility]

## 2022-09-04 ENCOUNTER — Ambulatory Visit: Payer: Medicare HMO | Admitting: Nurse Practitioner

## 2022-09-06 NOTE — Progress Notes (Unsigned)
Cardiology Office Note:    Date:  09/10/2022   ID:  Melvin Mcclure, DOB Aug 19, 1950, MRN [id number]  PCP:  Melvin Contras, MD  Cardiologist:  Melvin Millers, MD  Electrophysiologist:  None   Referring MD: Melvin Contras, MD   Chief Complaint: pre-op evaluation  History of Present Illness:    Melvin Mcclure is a 72 y.o. male with a history of chronic diastolic CHF, pleural effusions s/p multiple right sided thoracenteses (most recently in 07/2021), hypertension, hyperlipidemia, type 2 diabetes mellitus, ESRD on hemodialysis, and chronic anemia who is followed by Dr. Jens Som and presents today for pre-op evaluation for upcoming inguinal hernia repair.   Patient has been followed by Dr. Jens Som for diastolic CHF since 2020. Echo in 05/2018 showed LVEF of 60-65% with severe LVH. Myoview in 07/2018 showed diaphragmatic attenuation but no ischemia. PYP scan in 12/2019 was not suggestive of TTR amyloidosis. Renal artery dopplers in 12/2019 showed no evidence of renal artery stenosis bilaterally. Echo in 07/2021 showed LVEF of 55-60% with no regional wall motion abnormalities and grade 2 diastolic dysfunction as well as normal RV and no significant valvular disease. He has had progressive kidney disease over the last couple of years and now has ESRD so volume status is managed via hemodialysis. He also has a history of recurrent pleural effusion and has undergone multiple right sided thoracenteses in the past with most recent one being in 07/2021 due to an admission for acute on chronic respiratory failure. Patient was last seen by Dr. Jens Som in 04/2022 at which time he was stable from a cardiac standpoint but was having some problems with hypotension on dialysis days. He is currently being worked up for transplant at [facility]. As part of this evaluation, he had a TTE and stress Echo in 04/2022. TTE showed LVEF of 60-65% with prolonged relaxation but no regional wall motion abnormalities and  and no significant valvular disease Stress Echo was negative.  Patient presents today for pre-op evaluation for upcoming inguinal hernia repair. Here with wife. Patient states he has had a right inguinal hernia for 4-5 year but it just started bothering him recently. However, he is doing well from a cardiac standpoint. No chest pain, shortness, of breath, orthopnea, PND, edema. No cardiac complaints. He still has some problems with intermittent hypotension with dialysis but overall doing well with this. He has not been able to be as active over the last month given issues with his hernia but able to complete >4.0 METs.    Past Medical History:  Diagnosis Date   Anemia in chronic kidney disease (CKD)    stage 5   Anxiety    Arthritis    CHF (congestive heart failure) (HCC) 05/28/2018   dx 05/28/18   Diabetes mellitus without complication (HCC)    Hyperlipidemia    Hypertension    Iron deficiency anemia    Neuromuscular disorder (HCC)    neuropathy feet/hands   Perforating neurotrophic ulcer of foot (HCC)    Stage 4 chronic kidney disease (HCC)     Past Surgical History:  Procedure Laterality Date   ACHILLES TENDON REPAIR Bilateral    AV FISTULA PLACEMENT Left 03/21/2021   Procedure: LEFT BRACHIOCEPHALIC ARTERIOVENOUS (AV) FISTULA CREATION;  Surgeon: Leonie Douglas, MD;  Location: [facility] OR;  Service: Vascular;  Laterality: Left;   BRONCHIAL BIOPSY  07/24/2021   Procedure: BRONCHIAL BIOPSIES;  Surgeon: Melvin Glass, MD;  Location: [facility] ENDOSCOPY;  Service: Pulmonary;;   BRONCHIAL BRUSHINGS Right  07/24/2021   Procedure: BRONCHIAL BRUSHINGS;  Surgeon: Melvin Glass, MD;  Location: [facility] ENDOSCOPY;  Service: Pulmonary;  Laterality: Right;   BRONCHIAL WASHINGS  07/24/2021   Procedure: BRONCHIAL WASHINGS;  Surgeon: Melvin Glass, MD;  Location: [facility] ENDOSCOPY;  Service: Pulmonary;;   COLONOSCOPY     CYST EXCISION     lipoma removed from arm   IR THORACENTESIS ASP PLEURAL SPACE W/IMG GUIDE   12/26/2019   IR THORACENTESIS ASP PLEURAL SPACE W/IMG GUIDE  01/18/2020   THORACENTESIS  04/16/2020   VIDEO BRONCHOSCOPY Right 07/24/2021   Procedure: VIDEO BRONCHOSCOPY WITH FLUORO;  Surgeon: Melvin Glass, MD;  Location: [facility] ENDOSCOPY;  Service: Pulmonary;  Laterality: Right;    Current Medications: Current Meds  Medication Sig   acetaminophen (TYLENOL) 500 MG tablet Take 500-1,000 mg by mouth every 6 (six) hours as needed for moderate pain or headache.   aspirin 81 MG chewable tablet Chew 81 mg by mouth daily.   blood glucose meter kit and supplies KIT Dispense based on patient and insurance preference. Use up to four times daily as directed. (FOR ICD-9 250.00, 250.01).   calcium acetate (PHOSLO) 667 MG capsule Take 1 capsule (667 mg total) by mouth 3 (three) times daily with meals.   calcium carbonate (TUMS - DOSED IN MG ELEMENTAL CALCIUM) 500 MG chewable tablet Chew 2 tablets by mouth at bedtime.   diclofenac Sodium (VOLTAREN) 1 % GEL Apply 2 g topically 4 (four) times daily. (Patient taking differently: Apply 2 g topically 4 (four) times daily as needed (pain).)   docusate sodium (COLACE) 100 MG capsule Take 1 capsule (100 mg total) by mouth 2 (two) times daily.   feeding supplement (ENSURE ENLIVE / ENSURE PLUS) LIQD Take 237 mLs by mouth 2 (two) times daily between meals.   insulin glargine, 2 Unit Dial, (TOUJEO MAX SOLOSTAR) 300 UNIT/ML Solostar Pen Inject 18 Units into the skin every morning.   meclizine (ANTIVERT) 25 MG tablet Take 1 tablet (25 mg total) by mouth 3 (three) times daily. (Patient taking differently: Take 25 mg by mouth 2 (two) times daily.)   multivitamin (RENA-VIT) TABS tablet Take 1 tablet by mouth at bedtime.   polyethylene glycol (MIRALAX / GLYCOLAX) 17 g packet Take 17 g by mouth daily. (Patient taking differently: Take 17 g by mouth daily as needed.)   Torsemide 40 MG TABS Take 40 mg by mouth every other day. Take 40 mg on Non- Dyalisis Days     Allergies:    Patient has no known allergies.   Social History   Socioeconomic History   Marital status: Married    Spouse name: Not on file   Number of children: Not on file   Years of education: Not on file   Highest education level: Not on file  Occupational History   Not on file  Tobacco Use   Smoking status: Former    Packs/day: 1.00    Years: 1.50    Additional pack years: 0.00    Total pack years: 1.50    Types: Cigarettes   Smokeless tobacco: Never   Tobacco comments:    smoked for only 1.5 years in the 11's  Vaping Use   Vaping Use: Never used  Substance and Sexual Activity   Alcohol use: No    Alcohol/week: 0.0 standard drinks of alcohol   Drug use: No   Sexual activity: Not on file  Other Topics Concern   Not on file  Social History Narrative  Not on file   Social Determinants of Health   Financial Resource Strain: Not on file  Food Insecurity: Not on file  Transportation Needs: Not on file  Physical Activity: Not on file  Stress: Not on file  Social Connections: Not on file     Family History: The patient's family history includes Diabetes Mellitus II in his mother; Hypertension in his father.  ROS:   Please see the history of present illness.     EKGs/Labs/Other Studies Reviewed:    The following studies were reviewed:  Myoview 07/07/2018: The left ventricular ejection fraction is normal (55-65%). Nuclear stress EF: 57%. No wall motion abnormalities There was no ST segment deviation noted during stress. Defect 1: There is a medium defect of mild severity present in the basal inferior location. This appears secondary to diaphragmatic attenuation artifact This is a low risk study. No significant evidence of ischemia identified. _______________  PYP Scan 12/26/2019: Impressions: Visual and quantitative assessment (grade 1, H/CLL equal 1.1) are NOT suggestive of transthyretin amyloidosis.  _______________  Echocardiogram 07/23/2021: Impressions: 1. Left  ventricular ejection fraction, by estimation, is 55 to 60%. The  left ventricle has normal function. The left ventricle has no regional  wall motion abnormalities. There is mild concentric left ventricular  hypertrophy. Left ventricular diastolic  parameters are consistent with Grade II diastolic dysfunction  (pseudonormalization).   2. Right ventricular systolic function is normal. The right ventricular  size is normal. Tricuspid regurgitation signal is inadequate for assessing  PA pressure.   3. The mitral valve is normal in structure. No evidence of mitral valve  regurgitation. No evidence of mitral stenosis.   4. The aortic valve is tricuspid. There is mild calcification of the  aortic valve. Aortic valve regurgitation is not visualized. No aortic  stenosis is present.   5. The inferior vena cava is dilated in size with >50% respiratory  variability, suggesting right atrial pressure of 8 mmHg.   6. No LVAD. BP at closest time of evaluation 111/58 mm Hg (listed as 0/0  above).  _______________  Echocardiogram 04/30/2022 ([facility]): Summary: The left ventricular size is normal.  Moderate left ventricular hypertrophy.  Left ventricular systolic function is normal.  LV ejection fraction = 60-65%.  No segmental wall motion abnormalities seen in the left ventricle  Left ventricular filling pattern is prolonged relaxation.  The right ventricle is normal in size and function.  The left and right atrial size is normal.  The aortic valve is trileaflet with mild calcification consistent with aortic  valve sclerosis.  There is no significant valvular stenosis or regurgitation.  The IVC is normal in size with an inspiratory collapse of greater then 50%,  suggesting normal right atrial pressure.  There is no pericardial effusion.    Comparison(s): No significant change from prior study.   _______________  Stress Echocardiogram 04/30/2022 ([facility]): Summary: The patient had no chest pain.  The test was stopped due to leg fatigue.  The patient achieved 84 % of maximum predicted heart rate.  The METS achieved was 4.6.  Exercise capacity was average.  The patient had resting hypertension and normal response to stress.  Negative exercise stress ECG for inducible ischemia at heart rate achieved.  Negative exercise echocardiography for inducible ischemia at heart rate  achieved.     EKG:  EKG ordered today. EKG personally reviewed and demonstrates normal sinus rhythm, rate 85 bpm, with PACs, RBBB, LAFB, and LVH. No acute ST/T  changes. QTc automatically calculated at 535 ms but 485 ms on my personal calculation using Framingham formula.   Recent Labs: 08/19/2022: ALT 8; BUN 19; Creatinine, Ser 5.24; Hemoglobin 12.3; Platelets 179; Potassium 4.2; Sodium 136  Recent Lipid Panel    Component Value Date/Time   CHOL 178 11/29/2020 0948   TRIG 130 11/29/2020 0948   HDL 35 (L) 11/29/2020 0948   CHOLHDL 5.1 (H) 11/29/2020 0948   LDLCALC 118 (H) 11/29/2020 0948    Physical Exam:    Vital Signs: BP (!) 142/72   Pulse 84   Ht 5\' 11"  (1.803 m)   Wt 176 lb 9.6 oz (80.1 kg)   SpO2 99%   BMI 24.63 kg/m     Wt Readings from Last 3 Encounters:  09/10/22 176 lb 9.6 oz (80.1 kg)  08/19/22 185 lb (83.9 kg)  08/14/22 181 lb 14.1 oz (82.5 kg)     General: 72 y.o. African-American male in no acute distress. HEENT: Normocephalic and atraumatic. Sclera clear.  Neck: Supple. No JVD. Heart: RRR with occasional ectopy. Distinct S1 and S2. No significant murmurs, gallops, or rubs.  Lungs: No increased work of breathing. Clear to ausculation bilaterally. No wheezes, rhonchi, or rales.  Abdomen: Soft, non-distended, and non-tender to palpation.  Extremities: No lower extremity edema.    Skin: Warm and dry. Neuro: Alert and oriented x3. No focal deficits. Psych: Normal affect. Responds appropriately.   Assessment:    1. Pre-op  evaluation   2. Chronic diastolic CHF (congestive heart failure) (HCC)   3. Primary hypertension   4. Hyperlipidemia, unspecified hyperlipidemia type   5. Type 2 diabetes mellitus with complication, with long-term current use of insulin (HCC)   6. ESRD (end stage renal disease) (HCC)     Plan:    Pre-Op Evaluation Patient has an upcoming inguinal hernia repair planned. He is doing well from a cardiac standpoint. He is able to complete >4.0 METS without any anginal symptoms. Recent TTE and stress Echo in 04/2022 for renal transplant work-up were reassuring (see above for full results). Per Revised Cardiac Risk Index, considered high risk with >11% risk of adverse cardiac event perioperatively given history of CHF, insulin-dependent diabetes, and ESRD. However, well optimized from cardiac standpoint. Therefore, based on ACC/AHA guidelines, patient would be at acceptable risk for the planned procedure without further cardiovascular testing. In regards to Aspirin, patient has never had a MI or any cardiac intervention; therefore, okay to hold for 5-7 days prior to procedure from a cardiac standpoint. I will route this recommendation to the requesting party via Epic fax function.   Chronic Diastolic CHF Last Echo in 07/2021 showed LVEF of 55-60% with no regional wall motion abnormalities and grade 2 diastolic dysfunction as well as normal RV and no significant valvular disease. - Euvolemic on exam.  - Volume status managed via hemodialysis. He also takes Torsemide 40mg  on non-dialysis days.  Hypertension History of hypertension but now has some hypotension with dialysis.  - BP 142/72 today in the office but normally well controlled.  - He continues to have problems with BP dropping intermittently with dialysis so will not add any antihypertensives and continue to monitor for now. Wife is a Engineer, civil (consulting).  Hyperlipidemia Lipid panel in 03/2022: Total Cholesterol 205, Triglycerides 222, HDL 37. Direct LDL  113. - PCP has typically followed his cholesterol. ASCVD 10 year risk is 23.8% (high riks). Therefore, maximally tolerated statin is recommended. Did not have time to discuss this today. Can discuss at  next visit.   Type 2 Diabetes Mellitus Hemoglobin A1c 7.0% in 11/2021. - On Insulin. - Management per PCP.  ESRD on Hemodialysis On hemodialysis Tuesday/ Thursdays/ Saturdays.  - Management per Nephrology.  Disposition: Follow up in 6 months.    Medication Adjustments/Labs and Tests Ordered: Current medicines are reviewed at length with the patient today.  Concerns regarding medicines are outlined above.  Orders Placed This Encounter  Procedures   EKG 12-Lead   No orders of the defined types were placed in this encounter.   Patient Instructions  Medication Instructions:  Your physician recommends that you continue on your current medications as directed. Please refer to the Current Medication list given to you today.  *If you need a refill on your cardiac medications before your next appointment, please call your pharmacy*   Lab Work: NONE If you have labs (blood work) drawn today and your tests are completely normal, you will receive your results only by: MyChart Message (if you have MyChart) OR A paper copy in the mail If you have any lab test that is abnormal or we need to change your treatment, we will call you to review the results.   Testing/Procedures: NONE   Follow-Up: At [facility], you and your health needs are our priority.  As part of our continuing mission to provide you with exceptional heart care, we have created designated Provider Care Teams.  These Care Teams include your primary Cardiologist (physician) and Advanced Practice Providers (APPs -  Physician Assistants and Nurse Practitioners) who all work together to provide you with the care you need, when you need it.  We recommend signing up for the patient portal called "MyChart".  Sign up  information is provided on this After Visit Summary.  MyChart is used to connect with patients for Virtual Visits (Telemedicine).  Patients are able to view lab/test results, encounter notes, upcoming appointments, etc.  Non-urgent messages can be sent to your provider as well.   To learn more about what you can do with MyChart, go to [email or web address].    Your next appointment:   6 month(s)  Provider:   Olga Millers, MD      Signed, Corrin Parker, PA-C  09/10/2022 12:58 PM    [facility]

## 2022-09-10 ENCOUNTER — Ambulatory Visit: Payer: Medicare HMO | Attending: Student | Admitting: Student

## 2022-09-10 ENCOUNTER — Ambulatory Visit: Payer: Medicare HMO | Admitting: Nurse Practitioner

## 2022-09-10 ENCOUNTER — Encounter: Payer: Self-pay | Admitting: Student

## 2022-09-10 VITALS — BP 142/72 | HR 84 | Ht 71.0 in | Wt 176.6 lb

## 2022-09-10 DIAGNOSIS — Z01818 Encounter for other preprocedural examination: Secondary | ICD-10-CM

## 2022-09-10 DIAGNOSIS — Z794 Long term (current) use of insulin: Secondary | ICD-10-CM

## 2022-09-10 DIAGNOSIS — E785 Hyperlipidemia, unspecified: Secondary | ICD-10-CM

## 2022-09-10 DIAGNOSIS — I5032 Chronic diastolic (congestive) heart failure: Secondary | ICD-10-CM | POA: Diagnosis not present

## 2022-09-10 DIAGNOSIS — I10 Essential (primary) hypertension: Secondary | ICD-10-CM

## 2022-09-10 DIAGNOSIS — E118 Type 2 diabetes mellitus with unspecified complications: Secondary | ICD-10-CM

## 2022-09-10 DIAGNOSIS — N186 End stage renal disease: Secondary | ICD-10-CM

## 2022-09-10 NOTE — Patient Instructions (Signed)
Medication Instructions:  Your physician recommends that you continue on your current medications as directed. Please refer to the Current Medication list given to you today.  *If you need a refill on your cardiac medications before your next appointment, please call your pharmacy*   Lab Work: NONE If you have labs (blood work) drawn today and your tests are completely normal, you will receive your results only by: MyChart Message (if you have MyChart) OR A paper copy in the mail If you have any lab test that is abnormal or we need to change your treatment, we will call you to review the results.   Testing/Procedures: NONE   Follow-Up: At [facility], you and your health needs are our priority.  As part of our continuing mission to provide you with exceptional heart care, we have created designated Provider Care Teams.  These Care Teams include your primary Cardiologist (physician) and Advanced Practice Providers (APPs -  Physician Assistants and Nurse Practitioners) who all work together to provide you with the care you need, when you need it.  We recommend signing up for the patient portal called "MyChart".  Sign up information is provided on this After Visit Summary.  MyChart is used to connect with patients for Virtual Visits (Telemedicine).  Patients are able to view lab/test results, encounter notes, upcoming appointments, etc.  Non-urgent messages can be sent to your provider as well.   To learn more about what you can do with MyChart, go to [email or web address].    Your next appointment:   6 month(s)  Provider:   Brian Crenshaw, MD    

## 2022-09-12 ENCOUNTER — Encounter: Payer: Self-pay | Admitting: Podiatry

## 2022-09-12 ENCOUNTER — Ambulatory Visit: Payer: Medicare HMO | Admitting: Podiatry

## 2022-09-12 DIAGNOSIS — M79675 Pain in left toe(s): Secondary | ICD-10-CM | POA: Diagnosis not present

## 2022-09-12 DIAGNOSIS — M79674 Pain in right toe(s): Secondary | ICD-10-CM | POA: Diagnosis not present

## 2022-09-12 DIAGNOSIS — E1100 Type 2 diabetes mellitus with hyperosmolarity without nonketotic hyperglycemic-hyperosmolar coma (NKHHC): Secondary | ICD-10-CM | POA: Diagnosis not present

## 2022-09-12 DIAGNOSIS — B351 Tinea unguium: Secondary | ICD-10-CM | POA: Diagnosis not present

## 2022-09-12 NOTE — Progress Notes (Signed)
This patient returns to my office for at risk foot care.  This patient requires this care by a professional since this patient will be at risk due to having diabetes and CKD.  This patient is unable to cut nails himself since the patient cannot reach his nails.These nails are painful walking and wearing shoes.  This patient presents for at risk foot care today.  General Appearance  Alert, conversant and in no acute stress.  Vascular  Dorsalis pedis and posterior tibial  pulses are weakly palpable  bilaterally.  Capillary return is within normal limits  bilaterally. Temperature is within normal limits  bilaterally.  Neurologic  Senn-Weinstein monofilament wire test within normal limits  bilaterally. Muscle power within normal limits bilaterally.  Nails Thick disfigured discolored nails with subungual debris  from hallux to fifth toes bilaterally. No evidence of bacterial infection or drainage bilaterally.  Orthopedic  No limitations of motion  feet .  No crepitus or effusions noted.  No bony pathology or digital deformities noted.  Skin  normotropic skin with no porokeratosis noted bilaterally.  No signs of infections or ulcers noted.     Onychomycosis  Pain in right toes  Pain in left toes  Consent was obtained for treatment procedures.   Mechanical debridement of nails 1-5  bilaterally performed with a nail nipper.  Filed with dremel without incident.    Return office visit    10 weeks                  Told patient to return for periodic foot care and evaluation due to potential at risk complications.   Oziel Beitler DPM   

## 2022-10-14 ENCOUNTER — Ambulatory Visit: Payer: Self-pay | Admitting: General Surgery

## 2022-10-14 DIAGNOSIS — N185 Chronic kidney disease, stage 5: Secondary | ICD-10-CM

## 2022-10-14 NOTE — Pre-Procedure Instructions (Signed)
Surgical Instructions    Your procedure is scheduled on Friday, June 21st.  Report to [facility] Main Entrance "A" at 1:15 P.M., then check in with the Admitting office.  Call this number if you have problems the morning of surgery:  [phone]  If you have any questions prior to your surgery date call [phone]: Open Monday-Friday 8am-4pm If you experience any cold or flu symptoms such as cough, fever, chills, shortness of breath, etc. between now and your scheduled surgery, please notify us at the above number.     Remember:  Do not eat after midnight the night before your surgery  You may drink clear liquids until 12:15 PM the day of your surgery.   Clear liquids allowed are: Water, Non-Citrus Juices (without pulp), Carbonated Beverages, Clear Tea, Black Coffee Only (NO MILK, CREAM OR POWDERED CREAMER of any kind), and Gatorade.  Patient Instructions  The night before surgery:  No food after midnight. ONLY clear liquids after midnight   The day of surgery (if you have diabetes): Drink ONE (1) 12 oz G2 given to you in your pre admission testing appointment by 12:15 PM the day of surgery. Drink in one sitting. Do not sip.  This drink was given to you during your hospital  pre-op appointment visit.  Nothing else to drink after completing the  12 oz bottle of G2.         If you have questions, please contact your surgeon's office.      Take these medicines the morning of surgery with A SIP OF WATER  meclizine (ANTIVERT)    If needed: acetaminophen (TYLENOL)  hydrOXYzine (ATARAX)   Follow your surgeon's instructions on when to stop Aspirin.  If no instructions were given by your surgeon then you will need to call the office to get those instructions.     As of today, STOP taking any Aleve, Naproxen, diclofenac Sodium (VOLTAREN), Ibuprofen, Motrin, Advil, Goody's, BC's, all herbal medications, fish oil, and all vitamins.  WHAT DO I DO ABOUT MY DIABETES  MEDICATION?  THE MORNING OF SURGERY, take 10 units (50%) of insulin glargine, 2 Unit Dial, (TOUJEO MAX SOLOSTAR).     HOW TO MANAGE YOUR DIABETES BEFORE AND AFTER SURGERY  Why is it important to control my blood sugar before and after surgery? Improving blood sugar levels before and after surgery helps healing and can limit problems. A way of improving blood sugar control is eating a healthy diet by:  Eating less sugar and carbohydrates  Increasing activity/exercise  Talking with your doctor about reaching your blood sugar goals High blood sugars (greater than 180 mg/dL) can raise your risk of infections and slow your recovery, so you will need to focus on controlling your diabetes during the weeks before surgery. Make sure that the doctor who takes care of your diabetes knows about your planned surgery including the date and location.  How do I manage my blood sugar before surgery? Check your blood sugar at least 4 times a day, starting 2 days before surgery, to make sure that the level is not too high or low.  Check your blood sugar the morning of your surgery when you wake up and every 2 hours until you get to the Short Stay unit.  If your blood sugar is less than 70 mg/dL, you will need to treat for low blood sugar: Do not take insulin. Treat a low blood sugar (less than 70 mg/dL) with  cup of clear juice (cranberry or  apple), 4 glucose tablets, OR glucose gel. Recheck blood sugar in 15 minutes after treatment (to make sure it is greater than 70 mg/dL). If your blood sugar is not greater than 70 mg/dL on recheck, call [phone] for further instructions. Report your blood sugar to the short stay nurse when you get to Short Stay.  If you are admitted to the hospital after surgery: Your blood sugar will be checked by the staff and you will probably be given insulin after surgery (instead of oral diabetes medicines) to make sure you have good blood sugar levels. The goal for blood  sugar control after surgery is 80-180 mg/dL.                     Do NOT Smoke (Tobacco/Vaping) for 24 hours prior to your procedure.  If you use a CPAP at night, you may bring your mask/headgear for your overnight stay.   Contacts, glasses, piercing's, hearing aid's, dentures or partials may not be worn into surgery, please bring cases for these belongings.    For patients admitted to the hospital, discharge time will be determined by your treatment team.   Patients discharged the day of surgery will not be allowed to drive home, and someone needs to stay with them for 24 hours.  SURGICAL WAITING ROOM VISITATION Patients having surgery or a procedure may have no more than 2 support people in the waiting area - these visitors may rotate.   Children under the age of 35 must have an adult with them who is not the patient. If the patient needs to stay at the hospital during part of their recovery, the visitor guidelines for inpatient rooms apply. Pre-op nurse will coordinate an appropriate time for 1 support person to accompany patient in pre-op.  This support person may not rotate.   Please refer to the [facility] website for the visitor guidelines for Inpatients (after your surgery is over and you are in a regular room).    Special instructions:   [place]- Preparing For Surgery  Before surgery, you can play an important role. Because skin is not sterile, your skin needs to be as free of germs as possible. You can reduce the number of germs on your skin by washing with CHG (chlorahexidine gluconate) Soap before surgery.  CHG is an antiseptic cleaner which kills germs and bonds with the skin to continue killing germs even after washing.    Oral Hygiene is also important to reduce your risk of infection.  Remember - BRUSH YOUR TEETH THE MORNING OF SURGERY WITH YOUR REGULAR TOOTHPASTE  Please do not use if you have an allergy to CHG or antibacterial soaps. If your skin becomes  reddened/irritated stop using the CHG.  Do not shave (including legs and underarms) for at least 48 hours prior to first CHG shower. It is OK to shave your face.  Please follow these instructions carefully.   Shower the NIGHT BEFORE SURGERY and the MORNING OF SURGERY  If you chose to wash your hair, wash your hair first as usual with your normal shampoo.  After you shampoo, rinse your hair and body thoroughly to remove the shampoo.  Use CHG Soap as you would any other liquid soap. You can apply CHG directly to the skin and wash gently with a scrungie or a clean washcloth.   Apply the CHG Soap to your body ONLY FROM THE NECK DOWN.  Do not use on open wounds or open sores. Avoid contact  with your eyes, ears, mouth and genitals (private parts). Wash Face and genitals (private parts)  with your normal soap.   Wash thoroughly, paying special attention to the area where your surgery will be performed.  Thoroughly rinse your body with warm water from the neck down.  DO NOT shower/wash with your normal soap after using and rinsing off the CHG Soap.  Pat yourself dry with a CLEAN TOWEL.  Wear CLEAN PAJAMAS to bed the night before surgery  Place CLEAN SHEETS on your bed the night before your surgery  DO NOT SLEEP WITH PETS.   Day of Surgery: Take a shower with CHG soap. Do not wear jewelry  Do not wear lotions, powders, colognes, or deodorant.  Men may shave face and neck. Do not bring valuables to the hospital. [facility] is not responsible for any belongings or valuables. Wear Clean/Comfortable clothing the morning of surgery Remember to brush your teeth WITH YOUR REGULAR TOOTHPASTE.   Please read over the following fact sheets that you were given.    If you received a COVID test during your pre-op visit  it is requested that you wear a mask when out in public, stay away from anyone that may not be feeling well and notify your surgeon if you develop symptoms. If you have been in  contact with anyone that has tested positive in the last 10 days please notify you surgeon.

## 2022-10-15 ENCOUNTER — Encounter (HOSPITAL_COMMUNITY)
Admission: RE | Admit: 2022-10-15 | Discharge: 2022-10-15 | Disposition: A | Discharge status: Home / Self Care | Payer: Medicare HMO | Source: Ambulatory Visit | Attending: General Surgery | Admitting: General Surgery

## 2022-10-15 ENCOUNTER — Other Ambulatory Visit: Payer: Self-pay

## 2022-10-15 ENCOUNTER — Encounter (HOSPITAL_COMMUNITY): Payer: Self-pay

## 2022-10-15 VITALS — BP 151/79 | HR 86 | Temp 98.3°F | Resp 18 | Ht 70.0 in | Wt 182.0 lb

## 2022-10-15 DIAGNOSIS — N185 Chronic kidney disease, stage 5: Secondary | ICD-10-CM

## 2022-10-15 DIAGNOSIS — E785 Hyperlipidemia, unspecified: Secondary | ICD-10-CM | POA: Diagnosis not present

## 2022-10-15 DIAGNOSIS — I444 Left anterior fascicular block: Secondary | ICD-10-CM | POA: Diagnosis not present

## 2022-10-15 DIAGNOSIS — Z794 Long term (current) use of insulin: Secondary | ICD-10-CM | POA: Diagnosis not present

## 2022-10-15 DIAGNOSIS — E1122 Type 2 diabetes mellitus with diabetic chronic kidney disease: Secondary | ICD-10-CM | POA: Diagnosis not present

## 2022-10-15 DIAGNOSIS — N186 End stage renal disease: Secondary | ICD-10-CM | POA: Insufficient documentation

## 2022-10-15 DIAGNOSIS — I491 Atrial premature depolarization: Secondary | ICD-10-CM | POA: Diagnosis not present

## 2022-10-15 DIAGNOSIS — I132 Hypertensive heart and chronic kidney disease with heart failure and with stage 5 chronic kidney disease, or end stage renal disease: Secondary | ICD-10-CM | POA: Diagnosis not present

## 2022-10-15 DIAGNOSIS — I5032 Chronic diastolic (congestive) heart failure: Secondary | ICD-10-CM | POA: Diagnosis not present

## 2022-10-15 DIAGNOSIS — I700 Atherosclerosis of aorta: Secondary | ICD-10-CM | POA: Insufficient documentation

## 2022-10-15 DIAGNOSIS — I452 Bifascicular block: Secondary | ICD-10-CM | POA: Insufficient documentation

## 2022-10-15 DIAGNOSIS — I2510 Atherosclerotic heart disease of native coronary artery without angina pectoris: Secondary | ICD-10-CM | POA: Insufficient documentation

## 2022-10-15 DIAGNOSIS — K4090 Unilateral inguinal hernia, without obstruction or gangrene, not specified as recurrent: Secondary | ICD-10-CM | POA: Diagnosis not present

## 2022-10-15 DIAGNOSIS — E119 Type 2 diabetes mellitus without complications: Secondary | ICD-10-CM

## 2022-10-15 DIAGNOSIS — E1142 Type 2 diabetes mellitus with diabetic polyneuropathy: Secondary | ICD-10-CM | POA: Insufficient documentation

## 2022-10-15 DIAGNOSIS — Z01812 Encounter for preprocedural laboratory examination: Secondary | ICD-10-CM | POA: Insufficient documentation

## 2022-10-15 HISTORY — DX: Pleural effusion, not elsewhere classified: J90

## 2022-10-15 LAB — CBC WITH DIFFERENTIAL/PLATELET
Abs Immature Granulocytes: 0.06 10*3/uL (ref 0.00–0.07)
Basophils Absolute: 0.1 10*3/uL (ref 0.0–0.1)
Basophils Relative: 1 %
Eosinophils Absolute: 0.3 10*3/uL (ref 0.0–0.5)
Eosinophils Relative: 4 %
HCT: 38.4 % — ABNORMAL LOW (ref 39.0–52.0)
Hemoglobin: 12.1 g/dL — ABNORMAL LOW (ref 13.0–17.0)
Immature Granulocytes: 1 %
Lymphocytes Relative: 26 %
Lymphs Abs: 2.1 10*3/uL (ref 0.7–4.0)
MCH: 29.3 pg (ref 26.0–34.0)
MCHC: 31.5 g/dL (ref 30.0–36.0)
MCV: 93 fL (ref 80.0–100.0)
Monocytes Absolute: 0.7 10*3/uL (ref 0.1–1.0)
Monocytes Relative: 9 %
Neutro Abs: 4.8 10*3/uL (ref 1.7–7.7)
Neutrophils Relative %: 59 %
Platelets: 186 10*3/uL (ref 150–400)
RBC: 4.13 MIL/uL — ABNORMAL LOW (ref 4.22–5.81)
RDW: 14.6 % (ref 11.5–15.5)
WBC: 8 10*3/uL (ref 4.0–10.5)
nRBC: 0 % (ref 0.0–0.2)

## 2022-10-15 LAB — BASIC METABOLIC PANEL
Anion gap: 13 (ref 5–15)
BUN: 40 mg/dL — ABNORMAL HIGH (ref 8–23)
CO2: 28 mmol/L (ref 22–32)
Calcium: 8 mg/dL — ABNORMAL LOW (ref 8.9–10.3)
Chloride: 97 mmol/L — ABNORMAL LOW (ref 98–111)
Creatinine, Ser: 4.4 mg/dL — ABNORMAL HIGH (ref 0.61–1.24)
GFR, Estimated: 14 mL/min — ABNORMAL LOW (ref >60)
Glucose, Bld: 187 mg/dL — ABNORMAL HIGH (ref 70–99)
Potassium: 4.1 mmol/L (ref 3.5–5.1)
Sodium: 138 mmol/L (ref 135–145)

## 2022-10-15 LAB — HEMOGLOBIN A1C
Hgb A1c MFr Bld: 6.5 % — ABNORMAL HIGH (ref 4.8–5.6)
Mean Plasma Glucose: 139.85 mg/dL

## 2022-10-15 LAB — GLUCOSE, CAPILLARY: Glucose-Capillary: 188 mg/dL — ABNORMAL HIGH (ref 70–99)

## 2022-10-15 NOTE — Anesthesia Preprocedure Evaluation (Addendum)
Anesthesia Evaluation  Patient identified by MRN, date of birth, ID band Patient awake    Reviewed: Allergy & Precautions, NPO status , Patient's Chart, lab work & pertinent test results  Airway Mallampati: II  TM Distance: >3 FB Neck ROM: Full    Dental  (+) Dental Advisory Given   Pulmonary former smoker   breath sounds clear to auscultation       Cardiovascular hypertension, Pt. on medications +CHF   Rhythm:Regular Rate:Normal     Neuro/Psych negative neurological ROS     GI/Hepatic negative GI ROS, Neg liver ROS,,,  Endo/Other  diabetes, Type 2    Renal/GU ESRF and DialysisRenal disease     Musculoskeletal  (+) Arthritis ,    Abdominal   Peds  Hematology  (+) Blood dyscrasia, anemia   Anesthesia Other Findings   Reproductive/Obstetrics                              Lab Results  Component Value Date   WBC 8.0 10/15/2022   HGB 13.3 10/24/2022   HCT 39.0 10/24/2022   MCV 93.0 10/15/2022   PLT 186 10/15/2022   Lab Results  Component Value Date   CREATININE 3.50 (H) 10/24/2022   BUN 32 (H) 10/24/2022   NA 138 10/24/2022   K 4.0 10/24/2022   CL 96 (L) 10/24/2022   CO2 28 10/15/2022    Anesthesia Physical Anesthesia Plan  ASA: 3  Anesthesia Plan:    Post-op Pain Management: Tylenol PO (pre-op)* and Regional block*   Induction: Intravenous  PONV Risk Score and Plan: 1 and Dexamethasone, Ondansetron and Treatment may vary due to age or medical condition  Airway Management Planned: LMA  Additional Equipment:   Intra-op Plan:   Post-operative Plan: Extubation in OR  Informed Consent: I have reviewed the patients History and Physical, chart, labs and discussed the procedure including the risks, benefits and alternatives for the proposed anesthesia with the patient or authorized representative who has indicated his/her understanding and acceptance.     Dental  advisory given  Plan Discussed with: CRNA  Anesthesia Plan Comments: (  )        Anesthesia Quick Evaluation

## 2022-10-15 NOTE — Progress Notes (Signed)
PCP - Dr. Fleet Contras Cardiologist - Dr. Olga Millers  PPM/ICD - denies   Chest x-ray - 08/06/22 EKG - 09/10/22 Stress Test - 04/30/22 ECHO - 04/30/22 Cardiac Cath - denies  Sleep Study - denies   Fasting Blood Sugar - 90-110 Checks Blood Sugar continuously (continuous sensor)  Last dose of GLP1 agonist-  n/a   Blood Thinner Instructions: n/a Aspirin Instructions: Hold 5-7 days  ERAS Protcol - yes PRE-SURGERY G2- given at PAT  COVID TEST- n/a   Anesthesia review: yes, cardiac hx, HD pt (Dialysis T, Th, Sat)  Patient denies shortness of breath, fever, cough and chest pain at PAT appointment   All instructions explained to the patient, with a verbal understanding of the material. Patient agrees to go over the instructions while at home for a better understanding.  The opportunity to ask questions was provided.

## 2022-10-15 NOTE — Progress Notes (Signed)
Anesthesia Chart Review:  Case: [id number] Date/Time: 10/24/22 1501   Procedure: OPEN REPAIR RIGHT INGUINAL HERNIA WITH MESH (Right)   Anesthesia type: General   Pre-op diagnosis: RIGHT INGUINAL HERNIA   [facility] ROOM 02 / [facility]   Surgeons: Gaynelle Adu, MD       DISCUSSION: Patient is a 72 year old male scheduled for the above procedure.  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   Preoperative cardiology evaluation on 09/10/22 by Marjie Skiff, PA-C. She wrote, "Pre-Op Evaluation Patient has an upcoming inguinal hernia repair planned. He is doing well from a cardiac standpoint. He is able to complete >4.0 METS without any anginal symptoms. Recent TTE and stress Echo in 04/2022 for renal transplant work-up were reassuring (see above for full results). Per Revised Cardiac Risk Index, considered high risk with >11% risk of adverse cardiac event perioperatively given history of CHF, insulin-dependent diabetes, and ESRD. However, well optimized from cardiac standpoint. Therefore, based on ACC/AHA guidelines, patient would be at acceptable risk for the planned procedure without further cardiovascular testing. In regards to Aspirin, patient has never had a MI or any cardiac intervention; therefore, okay to hold for 5-7 days prior to procedure from a cardiac standpoint." Follow-up in six months planned. He denied chest pain and SOB at PAT  RN visit.   A1c 6.5%. He wears a continuous glucose sensor. He is taking insulin glargine 20 units Q AM.  He undergoing hemodialysis TTS. He had renal transplant evaluation at [facility] recently, but as of 07/29/22, "Pt did not meet selection criteria for listing per most recent policy. Pt determined to be not a candidate for transplant at this time related to pulmonary and deconditioning." ISTAT on arrival given ESRD.    VS: BP (!) 151/79   Pulse 86   Temp 36.8 C   Resp 18   Ht 5\' 10"  (1.778 m)   Wt 82.6 kg   SpO2 99%   BMI 26.11 kg/m    PROVIDERS: Fleet Contras, MD is PCP  Ivor Messier, MD is cardiologist  Nephrologist is with [facility]   LABS: Labs reviewed: Acceptable for surgery. He will get an iSTAT on arrival since he is a dialysis patient. (all labs ordered are listed, but only abnormal results are displayed)  Labs Reviewed  GLUCOSE, CAPILLARY - Abnormal; Notable for the following components:      Result Value   Glucose-Capillary 188 (*)    All other components within normal limits  BASIC METABOLIC PANEL - Abnormal; Notable for the following components:   Chloride 97 (*)    Glucose, Bld 187 (*)    BUN 40 (*)    Creatinine, Ser 4.40 (*)    Calcium 8.0 (*)    GFR, Estimated 14 (*)    All other components within normal limits  CBC WITH DIFFERENTIAL/PLATELET - Abnormal; Notable for the following components:   RBC 4.13 (*)    Hemoglobin 12.1 (*)    HCT 38.4 (*)    All other components within normal limits  HEMOGLOBIN A1C - Abnormal; Notable for the  following components:   Hgb A1c MFr Bld 6.5 (*)    All other components within normal limits    OTHER:  PFTs 04/30/2022 ([facility]): FVC Pre-Actual L 2.41  FVC %Pred-Pre % 62.5  FVC LLN L 2.89  FEV1 Pre-Actual L 2.05  FEV1 %Pred-Pre % 70.1  FEV1 LLN L 2.09  FEV1FVC Pre-Actual % 85  FEV1FVC LLN % 64  SVC Pre-Actual L 2.30  SVC %Pred-Pre % 51.3  SVC LLN L 3.57  DLCOUNC Pre-Actual  ml-min-mmHg 15.32  DLCOUNC %Pred-Pre % 59.8  DLCOUNC LLN ml-min-mmHg 18.75  DLVA %Pred-Pre % 97.8  FEV1SVC Pre-Actual % 89  Quality of Spirometry  Adequate effort, results reproducible.   Spirometry: No obstruction. Possible restriction or a non-specific pattern is present. (Lung volumes needed to assess) based on reduced FVC and FEV1  Lung Volumes:  Patient not able to perform lung volume testing. Unable to tolerate pleth or N2 testing.  Diffusing Capacity:  Reduced DLCO, reduced VA, with low/normal DLCO/VA (KCO). May be seen with interstitial lung disease or emphysema.   Flow-Volume Loop:  Flow-volume loop suggests restriction.   Summary:  Preserved Ratio Impaired Spirometry -PRISM (Reduced FEV1, FVC and Normal FEV1/FVC. Lung volumes not available)  A moderate (z-score -2.5 to -4) gas transfer impairment is present.  Given reduced FVC and FEV1 with restrictive flow volume loop and impaired DLCO, overall pattern is suggestive of a restrictive lung defect despite patient not being able to complete lung volume testing. Clinical correlation recommended.         04/30/2022 ([facility]; see PFT report):  At baseline, patient's HR was 87 bpm, blood pressure was 125/59.  Oxygen saturation was 100%. RPE: 0/10 - Nothing. RPD: 2/10 - Light.  Post exercise, HR 102 bpm, blood pressure at end of walk was 152/78, and oxygen saturation 92 %.  RPD-peak:0/10 - No Shortness of Breath at all. RPE-nadir: 3/10 - Moderately. Patient walked a total distance of 1075 feet feet or 327.66 meters.  Predicted distance -  Male: 522.23 meters. % Predicted distance - male : 62.74 %. MPH Pace: 2.04 mph. VO2: 8.97.  Ambulate continuously?:Yes. Assistive Device used None.    IMAGES: CT Abd/pelvis 08/14/2022: IMPRESSION: 1. No acute CT findings of the abdomen or pelvis to explain severe right-sided abdominal pain. 2. Moderate burden of stool throughout the colon and rectum. 3. No significant change in an enlarged left  retroperitoneal node measuring 2.2 x 2.1 cm. This is of uncertain significance but most likely reactive given long-term stability. 4. Rounded atelectasis in the right lung base. 5. Coronary artery disease. - Aortic Atherosclerosis (ICD10-I70.0).   EKG: 09/10/2022 ([facility]) Sinus rhythm with premature atrial complexes Right bundle branch block Left anterior fascicular block Bifascicular block Left ventricular hypertrophy with repolarization abnormality Possible Lateral infarct, age undetermined Cannot rule out Inferior infarct, age undetermined   CV: Echocardiogram 04/30/2022 ([facility]): SUMMARY: The left ventricular size is normal.  Moderate left ventricular hypertrophy.  Left ventricular systolic function is normal.  LV ejection fraction = 60-65%.  No segmental wall motion abnormalities seen in the left ventricle  Left ventricular filling pattern is prolonged relaxation.  The right ventricle is normal in size and function.  The left and right atrial size is normal.  The aortic valve is trileaflet with mild calcification consistent with aortic  valve sclerosis.  There is no significant valvular stenosis or regurgitation.  The IVC is normal in size with an inspiratory collapse of greater then 50%,  suggesting  normal right atrial pressure.  There is no pericardial effusion.    Stress Echocardiogram 04/30/2022 ([facility]): SUMMARY: The patient had no chest pain.  The test was stopped due to leg fatigue.  The patient achieved 84 % of maximum predicted heart rate.  The METS achieved was 4.6.  Exercise capacity was average.  The patient had resting hypertension and normal response to stress.  Negative exercise stress ECG for inducible ischemia at heart rate achieved.  Negative exercise echocardiography for inducible ischemia at heart rate  achieved.     Aortoiliac Duplex 04/30/2022 ([facility]; per Transplant Evaluation): - Right No evidence of increased  velocities in the common iliac artery and external iliac artery. Normal respirophasic Doppler flow in the common iliac vein and the external iliac vein. - Left Increased velocities in the common iliac artery noted no evidence of increased velocities in the external iliac artery. Normal respirophasic Doppler flow in the common iliac vein and the external iliac vein.   Carotid Doppler 04/30/2022 ([facility]; per Transplant Evaluation): Carotid Doppler: - Right: The internal carotid artery appears within normal limits by duplex imaging. - Left: The internal carotid artery appears within normal limits by duplex imaging.   NM Cardiac Amyloid Study 12/26/2019: IMPRESSION: Visual and quantitative assessment (grade 1, H/CLL equal 1.1) are NOT suggestive of transthyretin amyloidosis.   Past Medical History:  Diagnosis Date   Anemia in chronic kidney disease (CKD)    stage 5   Anxiety    Arthritis    CHF (congestive heart failure) (HCC) 05/28/2018   dx 05/28/18   Chronic bilateral pleural effusions    Diabetes mellitus without complication (HCC)    Hyperlipidemia    Hypertension    Iron deficiency anemia    Neuromuscular disorder (HCC)    neuropathy feet/hands   Perforating neurotrophic ulcer of foot (HCC)    Stage 4 chronic kidney disease (HCC)     Past Surgical History:  Procedure Laterality Date   ACHILLES TENDON REPAIR Bilateral    AV FISTULA PLACEMENT Left 03/21/2021   Procedure: LEFT BRACHIOCEPHALIC ARTERIOVENOUS (AV) FISTULA CREATION;  Surgeon: Leonie Douglas, MD;  Location[facility];  Service: Vascular;  Laterality: Left;   BRONCHIAL BIOPSY  07/24/2021   Procedure: BRONCHIAL BIOPSIES;  Surgeon: Lorin Glass, MD;  Location: [facility] ENDOSCOPY;  Service: Pulmonary;;   BRONCHIAL BRUSHINGS Right 07/24/2021   Procedure: BRONCHIAL BRUSHINGS;  Surgeon: Lorin Glass, MD;  Location: [facility] ENDOSCOPY;  Service: Pulmonary;  Laterality: Right;   BRONCHIAL WASHINGS  07/24/2021   Procedure:  BRONCHIAL WASHINGS;  Surgeon: Lorin Glass, MD;  Location: [facility] ENDOSCOPY;  Service: Pulmonary;;   COLONOSCOPY     CYST EXCISION Left    lipoma removed from arm   IR THORACENTESIS ASP PLEURAL SPACE W/IMG GUIDE  12/26/2019   IR THORACENTESIS ASP PLEURAL SPACE W/IMG GUIDE  01/18/2020   THORACENTESIS  04/16/2020   VIDEO BRONCHOSCOPY Right 07/24/2021   Procedure: VIDEO BRONCHOSCOPY WITH FLUORO;  Surgeon: Lorin Glass, MD;  Location: [facility] ENDOSCOPY;  Service: Pulmonary;  Laterality: Right;    MEDICATIONS:  acetaminophen (TYLENOL) 500 MG tablet   aspirin 81 MG chewable tablet   blood glucose meter kit and supplies KIT   calcium acetate (PHOSLO) 667 MG capsule   calcium carbonate (TUMS - DOSED IN MG ELEMENTAL CALCIUM) 500 MG chewable tablet   diclofenac Sodium (VOLTAREN) 1 % GEL   feeding supplement (ENSURE ENLIVE / ENSURE PLUS) LIQD   hydrOXYzine (ATARAX) 25 MG tablet  insulin glargine, 2 Unit Dial, (TOUJEO MAX SOLOSTAR) 300 UNIT/ML Solostar Pen   meclizine (ANTIVERT) 25 MG tablet   polyethylene glycol (MIRALAX / GLYCOLAX) 17 g packet   senna-docusate (SENOKOT-S) 8.6-50 MG tablet   Torsemide 40 MG TABS   No current facility-administered medications for this encounter.    Shonna Chock, PA-C Surgical Short Stay/Anesthesiology [facility] Phone [phone] [facility] Phone [phone] 10/15/2022 4:20 PM

## 2022-10-24 ENCOUNTER — Encounter (HOSPITAL_COMMUNITY): Payer: Self-pay | Admitting: General Surgery

## 2022-10-24 ENCOUNTER — Ambulatory Visit (HOSPITAL_COMMUNITY): Payer: Medicare HMO | Admitting: Vascular Surgery

## 2022-10-24 ENCOUNTER — Other Ambulatory Visit: Payer: Self-pay

## 2022-10-24 ENCOUNTER — Encounter (HOSPITAL_COMMUNITY)
Admission: RE | Disposition: A | Discharge status: Home / Self Care | Payer: Self-pay | Source: Home / Self Care | Attending: General Surgery

## 2022-10-24 ENCOUNTER — Ambulatory Visit (HOSPITAL_BASED_OUTPATIENT_CLINIC_OR_DEPARTMENT_OTHER): Payer: Medicare HMO | Admitting: Anesthesiology

## 2022-10-24 ENCOUNTER — Ambulatory Visit (HOSPITAL_COMMUNITY)
Admission: RE | Admit: 2022-10-24 | Discharge: 2022-10-24 | Disposition: A | Discharge status: Home / Self Care | Payer: Medicare HMO | Attending: General Surgery | Admitting: General Surgery

## 2022-10-24 DIAGNOSIS — E1122 Type 2 diabetes mellitus with diabetic chronic kidney disease: Secondary | ICD-10-CM | POA: Insufficient documentation

## 2022-10-24 DIAGNOSIS — Z09 Encounter for follow-up examination after completed treatment for conditions other than malignant neoplasm: Secondary | ICD-10-CM | POA: Diagnosis not present

## 2022-10-24 DIAGNOSIS — I509 Heart failure, unspecified: Secondary | ICD-10-CM

## 2022-10-24 DIAGNOSIS — D631 Anemia in chronic kidney disease: Secondary | ICD-10-CM | POA: Diagnosis not present

## 2022-10-24 DIAGNOSIS — Z7982 Long term (current) use of aspirin: Secondary | ICD-10-CM | POA: Diagnosis not present

## 2022-10-24 DIAGNOSIS — K4090 Unilateral inguinal hernia, without obstruction or gangrene, not specified as recurrent: Secondary | ICD-10-CM | POA: Diagnosis present

## 2022-10-24 DIAGNOSIS — E785 Hyperlipidemia, unspecified: Secondary | ICD-10-CM | POA: Insufficient documentation

## 2022-10-24 DIAGNOSIS — N186 End stage renal disease: Secondary | ICD-10-CM

## 2022-10-24 DIAGNOSIS — I5032 Chronic diastolic (congestive) heart failure: Secondary | ICD-10-CM | POA: Diagnosis not present

## 2022-10-24 DIAGNOSIS — Z87891 Personal history of nicotine dependence: Secondary | ICD-10-CM | POA: Diagnosis not present

## 2022-10-24 DIAGNOSIS — I132 Hypertensive heart and chronic kidney disease with heart failure and with stage 5 chronic kidney disease, or end stage renal disease: Secondary | ICD-10-CM | POA: Diagnosis not present

## 2022-10-24 DIAGNOSIS — E1100 Type 2 diabetes mellitus with hyperosmolarity without nonketotic hyperglycemic-hyperosmolar coma (NKHHC): Secondary | ICD-10-CM

## 2022-10-24 DIAGNOSIS — Z992 Dependence on renal dialysis: Secondary | ICD-10-CM

## 2022-10-24 DIAGNOSIS — N185 Chronic kidney disease, stage 5: Secondary | ICD-10-CM

## 2022-10-24 DIAGNOSIS — E119 Type 2 diabetes mellitus without complications: Secondary | ICD-10-CM

## 2022-10-24 HISTORY — PX: INGUINAL HERNIA REPAIR: SHX194

## 2022-10-24 LAB — POCT I-STAT, CHEM 8
BUN: 32 mg/dL — ABNORMAL HIGH (ref 8–23)
Calcium, Ion: 0.93 mmol/L — ABNORMAL LOW (ref 1.15–1.40)
Chloride: 96 mmol/L — ABNORMAL LOW (ref 98–111)
Creatinine, Ser: 3.5 mg/dL — ABNORMAL HIGH (ref 0.61–1.24)
Glucose, Bld: 133 mg/dL — ABNORMAL HIGH (ref 70–99)
HCT: 39 % (ref 39.0–52.0)
Hemoglobin: 13.3 g/dL (ref 13.0–17.0)
Potassium: 4 mmol/L (ref 3.5–5.1)
Sodium: 138 mmol/L (ref 135–145)
TCO2: 29 mmol/L (ref 22–32)

## 2022-10-24 LAB — GLUCOSE, CAPILLARY
Glucose-Capillary: 135 mg/dL — ABNORMAL HIGH (ref 70–99)
Glucose-Capillary: 89 mg/dL (ref 70–99)

## 2022-10-24 SURGERY — REPAIR, HERNIA, INGUINAL, ADULT
Anesthesia: General | Laterality: Right

## 2022-10-24 MED ORDER — ORAL CARE MOUTH RINSE: 15.0000 mL | Freq: Once | OROMUCOSAL | Status: AC

## 2022-10-24 MED ORDER — CHLORHEXIDINE GLUCONATE CLOTH 2 % EX PADS: 6.0000 | MEDICATED_PAD | Freq: Once | CUTANEOUS | Status: DC

## 2022-10-24 MED ORDER — LIDOCAINE 2% (20 MG/ML) 5 ML SYRINGE
INTRAMUSCULAR | Status: AC
  Filled 2022-10-24: qty 5

## 2022-10-24 MED ORDER — TRAMADOL HCL 50 MG PO TABS: 50.0000 mg | ORAL_TABLET | Freq: Four times a day (QID) | ORAL | 0 refills | Status: AC | PRN

## 2022-10-24 MED ORDER — CHLORHEXIDINE GLUCONATE 0.12 % MT SOLN
OROMUCOSAL | Status: AC
  Administered 2022-10-24: 15 mL via OROMUCOSAL
  Filled 2022-10-24: qty 15

## 2022-10-24 MED ORDER — 0.9 % SODIUM CHLORIDE (POUR BTL) OPTIME
TOPICAL | Status: DC | PRN
  Administered 2022-10-24: 1000 mL

## 2022-10-24 MED ORDER — DEXAMETHASONE SODIUM PHOSPHATE 10 MG/ML IJ SOLN
INTRAMUSCULAR | Status: AC
  Filled 2022-10-24: qty 1

## 2022-10-24 MED ORDER — PHENYLEPHRINE 80 MCG/ML (10ML) SYRINGE FOR IV PUSH (FOR BLOOD PRESSURE SUPPORT)
PREFILLED_SYRINGE | INTRAVENOUS | Status: AC
  Filled 2022-10-24: qty 50

## 2022-10-24 MED ORDER — ROCURONIUM BROMIDE 10 MG/ML (PF) SYRINGE
PREFILLED_SYRINGE | INTRAVENOUS | Status: DC | PRN
  Administered 2022-10-24: 50 mg via INTRAVENOUS

## 2022-10-24 MED ORDER — ROCURONIUM BROMIDE 10 MG/ML (PF) SYRINGE
PREFILLED_SYRINGE | INTRAVENOUS | Status: AC
  Filled 2022-10-24: qty 10

## 2022-10-24 MED ORDER — PROPOFOL 10 MG/ML IV BOLUS
INTRAVENOUS | Status: AC
  Filled 2022-10-24: qty 20

## 2022-10-24 MED ORDER — EPHEDRINE 5 MG/ML INJ
INTRAVENOUS | Status: AC
  Filled 2022-10-24: qty 10

## 2022-10-24 MED ORDER — ONDANSETRON HCL 4 MG/2ML IJ SOLN
INTRAMUSCULAR | Status: DC | PRN
  Administered 2022-10-24: 4 mg via INTRAVENOUS

## 2022-10-24 MED ORDER — CHLORHEXIDINE GLUCONATE 0.12 % MT SOLN: 15.0000 mL | Freq: Once | OROMUCOSAL | Status: AC

## 2022-10-24 MED ORDER — MIDAZOLAM HCL 2 MG/2ML IJ SOLN
INTRAMUSCULAR | Status: AC
  Administered 2022-10-24: 0.5 mg via INTRAVENOUS
  Filled 2022-10-24: qty 2

## 2022-10-24 MED ORDER — CLONIDINE HCL (ANALGESIA) 100 MCG/ML EP SOLN
EPIDURAL | Status: DC | PRN
  Administered 2022-10-24: 50 ug

## 2022-10-24 MED ORDER — PROPOFOL 10 MG/ML IV BOLUS
INTRAVENOUS | Status: DC | PRN
  Administered 2022-10-24: 120 mg via INTRAVENOUS

## 2022-10-24 MED ORDER — ONDANSETRON HCL 4 MG/2ML IJ SOLN
INTRAMUSCULAR | Status: AC
  Filled 2022-10-24: qty 4

## 2022-10-24 MED ORDER — ROPIVACAINE HCL 5 MG/ML IJ SOLN
INTRAMUSCULAR | Status: DC | PRN
  Administered 2022-10-24: 30 mL via PERINEURAL

## 2022-10-24 MED ORDER — FENTANYL CITRATE (PF) 250 MCG/5ML IJ SOLN
INTRAMUSCULAR | Status: DC | PRN
  Administered 2022-10-24: 50 ug via INTRAVENOUS

## 2022-10-24 MED ORDER — EPHEDRINE SULFATE-NACL 50-0.9 MG/10ML-% IV SOSY
PREFILLED_SYRINGE | INTRAVENOUS | Status: DC | PRN
  Administered 2022-10-24: 10 mg via INTRAVENOUS

## 2022-10-24 MED ORDER — LACTATED RINGERS IV SOLN: INTRAVENOUS | Status: DC

## 2022-10-24 MED ORDER — PHENYLEPHRINE 80 MCG/ML (10ML) SYRINGE FOR IV PUSH (FOR BLOOD PRESSURE SUPPORT)
PREFILLED_SYRINGE | INTRAVENOUS | Status: DC | PRN
  Administered 2022-10-24: 80 ug via INTRAVENOUS

## 2022-10-24 MED ORDER — CEFAZOLIN SODIUM-DEXTROSE 2-4 GM/100ML-% IV SOLN
INTRAVENOUS | Status: AC
  Filled 2022-10-24: qty 100

## 2022-10-24 MED ORDER — SODIUM CHLORIDE 0.9 % IV SOLN: INTRAVENOUS | Status: DC | PRN

## 2022-10-24 MED ORDER — ACETAMINOPHEN 500 MG PO TABS: 1000.0000 mg | ORAL_TABLET | ORAL | Status: AC

## 2022-10-24 MED ORDER — ONDANSETRON HCL 4 MG/2ML IJ SOLN
INTRAMUSCULAR | Status: AC
  Filled 2022-10-24: qty 2

## 2022-10-24 MED ORDER — CEFAZOLIN SODIUM-DEXTROSE 2-4 GM/100ML-% IV SOLN
2.0000 g | INTRAVENOUS | Status: AC
  Administered 2022-10-24: 2 g via INTRAVENOUS

## 2022-10-24 MED ORDER — SUGAMMADEX SODIUM 200 MG/2ML IV SOLN
INTRAVENOUS | Status: DC | PRN
  Administered 2022-10-24: 163.2 mg via INTRAVENOUS

## 2022-10-24 MED ORDER — LIDOCAINE 2% (20 MG/ML) 5 ML SYRINGE
INTRAMUSCULAR | Status: DC | PRN
  Administered 2022-10-24: 60 mg via INTRAVENOUS

## 2022-10-24 MED ORDER — ACETAMINOPHEN 500 MG PO TABS: 1000.0000 mg | ORAL_TABLET | Freq: Four times a day (QID) | ORAL | 0 refills | Status: AC

## 2022-10-24 MED ORDER — BUPIVACAINE-EPINEPHRINE 0.25% -1:200000 IJ SOLN
INTRAMUSCULAR | Status: DC | PRN
  Administered 2022-10-24: 17 mL

## 2022-10-24 MED ORDER — ACETAMINOPHEN 500 MG PO TABS
ORAL_TABLET | ORAL | Status: AC
  Administered 2022-10-24: 1000 mg via ORAL
  Filled 2022-10-24: qty 1

## 2022-10-24 MED ORDER — MIDAZOLAM HCL 2 MG/2ML IJ SOLN: 0.5000 mg | Freq: Once | INTRAMUSCULAR | Status: AC

## 2022-10-24 MED ORDER — FENTANYL CITRATE (PF) 250 MCG/5ML IJ SOLN
INTRAMUSCULAR | Status: AC
  Filled 2022-10-24: qty 5

## 2022-10-24 MED ORDER — FENTANYL CITRATE (PF) 100 MCG/2ML IJ SOLN
INTRAMUSCULAR | Status: AC
  Filled 2022-10-24: qty 2

## 2022-10-24 SURGICAL SUPPLY — 46 items
ADH SKN CLS LQ APL DERMABOND (GAUZE/BANDAGES/DRESSINGS) ×1
APL PRP STRL LF DISP 70% ISPRP (MISCELLANEOUS) ×1
APL SKNCLS STERI-STRIP NONHPOA (GAUZE/BANDAGES/DRESSINGS) ×1
BAG COUNTER SPONGE SURGICOUNT (BAG) ×2 IMPLANT
BAG SPNG CNTER NS LX DISP (BAG) ×1
BENZOIN TINCTURE PRP APPL 2/3 (GAUZE/BANDAGES/DRESSINGS) ×2 IMPLANT
BLADE CLIPPER SURG (BLADE) IMPLANT
CANISTER SUCT 3000ML PPV (MISCELLANEOUS) IMPLANT
CHLORAPREP W/TINT 26 (MISCELLANEOUS) ×2 IMPLANT
COVER SURGICAL LIGHT HANDLE (MISCELLANEOUS) ×2 IMPLANT
DERMABOND ADVANCED .7 DNX6 (GAUZE/BANDAGES/DRESSINGS) IMPLANT
DRAIN PENROSE .5X12 LATEX STL (DRAIN) IMPLANT
DRAPE LAPAROTOMY 100X72 PEDS (DRAPES) ×2 IMPLANT
DRAPE LAPAROTOMY TRNSV 102X78 (DRAPES) IMPLANT
DRSG TEGADERM 4X4.75 (GAUZE/BANDAGES/DRESSINGS) ×2 IMPLANT
ELECT REM PT RETURN 9FT ADLT (ELECTROSURGICAL) ×1
ELECTRODE REM PT RTRN 9FT ADLT (ELECTROSURGICAL) ×2 IMPLANT
GAUZE SPONGE 4X4 12PLY STRL (GAUZE/BANDAGES/DRESSINGS) ×2 IMPLANT
GLOVE BIOGEL M STRL SZ7.5 (GLOVE) ×2 IMPLANT
GLOVE BIOGEL PI IND STRL 8 (GLOVE) ×4 IMPLANT
GOWN STRL REUS W/ TWL LRG LVL3 (GOWN DISPOSABLE) ×2 IMPLANT
GOWN STRL REUS W/ TWL XL LVL3 (GOWN DISPOSABLE) ×2 IMPLANT
GOWN STRL REUS W/TWL LRG LVL3 (GOWN DISPOSABLE) ×1
GOWN STRL REUS W/TWL XL LVL3 (GOWN DISPOSABLE) ×1
KIT BASIN OR (CUSTOM PROCEDURE TRAY) ×2 IMPLANT
KIT TURNOVER KIT B (KITS) ×2 IMPLANT
MESH BARD SOFT 3X6IN (Mesh General) IMPLANT
NDL HYPO 25GX1X1/2 BEV (NEEDLE) ×2 IMPLANT
NEEDLE HYPO 25GX1X1/2 BEV (NEEDLE) ×1 IMPLANT
NS IRRIG 1000ML POUR BTL (IV SOLUTION) ×2 IMPLANT
PACK GENERAL/GYN (CUSTOM PROCEDURE TRAY) ×2 IMPLANT
PAD ARMBOARD 7.5X6 YLW CONV (MISCELLANEOUS) ×2 IMPLANT
PENCIL SMOKE EVACUATOR (MISCELLANEOUS) ×2 IMPLANT
SPECIMEN JAR SMALL (MISCELLANEOUS) IMPLANT
SPONGE INTESTINAL PEANUT (DISPOSABLE) IMPLANT
STRIP CLOSURE SKIN 1/2X4 (GAUZE/BANDAGES/DRESSINGS) ×2 IMPLANT
SUT MNCRL AB 4-0 PS2 18 (SUTURE) ×2 IMPLANT
SUT PROLENE 2 0 CT2 30 (SUTURE) ×4 IMPLANT
SUT VIC AB 2-0 CT1 36 (SUTURE) IMPLANT
SUT VIC AB 2-0 SH 27 (SUTURE) ×1
SUT VIC AB 2-0 SH 27XBRD (SUTURE) ×2 IMPLANT
SUT VIC AB 3-0 SH 18 (SUTURE) ×2 IMPLANT
SUT VICRYL AB 3 0 TIES (SUTURE) ×2 IMPLANT
SYR CONTROL 10ML LL (SYRINGE) ×2 IMPLANT
TOWEL GREEN STERILE (TOWEL DISPOSABLE) ×2 IMPLANT
TOWEL GREEN STERILE FF (TOWEL DISPOSABLE) ×4 IMPLANT

## 2022-10-24 NOTE — Anesthesia Procedure Notes (Signed)
Procedure Name: Intubation Date/Time: 10/24/2022 2:13 PM  Performed by: Gus Puma, CRNAPre-anesthesia Checklist: Patient identified, Emergency Drugs available, Suction available and Patient being monitored Patient Re-evaluated:Patient Re-evaluated prior to induction Oxygen Delivery Method: Circle System Utilized Preoxygenation: Pre-oxygenation with 100% oxygen Induction Type: IV induction Ventilation: Mask ventilation without difficulty Laryngoscope Size: Mac and 4 Grade View: Grade I Tube type: Oral Tube size: 7.5 mm Number of attempts: 1 Airway Equipment and Method: Stylet Placement Confirmation: ETT inserted through vocal cords under direct vision, positive ETCO2 and breath sounds checked- equal and bilateral Secured at: 23 cm Tube secured with: Tape Dental Injury: Teeth and Oropharynx as per pre-operative assessment

## 2022-10-24 NOTE — Transfer of Care (Signed)
Immediate Anesthesia Transfer of Care Note  Patient: Melvin Mcclure  Procedure(s) Performed: OPEN REPAIR RIGHT DIRECT INGUINAL HERNIA WITH MESH (Right)  Patient Location: PACU  Anesthesia Type:General  Level of Consciousness: awake and alert   Airway & Oxygen Therapy: Patient Spontanous Breathing  Post-op Assessment: Report given to RN and Post -op Vital signs reviewed and stable  Post vital signs: Reviewed and stable  Last Vitals:  Vitals Value Taken Time  BP 141/67 10/24/22 1730  Temp 36.7 C 10/24/22 1730  Pulse 81 10/24/22 1730  Resp 20 10/24/22 1730  SpO2 100 % 10/24/22 1730  Vitals shown include unvalidated device data.  Last Pain:  Vitals:   10/24/22 1800  PainSc: 0-No pain         Complications: No notable events documented.

## 2022-10-24 NOTE — H&P (Signed)
CC: here for surgery  Requesting provider: n/a  HPI: Melvin Mcclure is an 72 y.o. male who is here for open repair of right inguinal hernia.  He denies any changes since I saw him in the clinic in early May.  He has been having ongoing pain in his right groin.  He had dialysis yesterday.  He is accompanied by his wife.  Old hpi: Past medical history significant for diabetes mellitus type 2, hypertension, hyperlipidemia, CHF, end-stage renal disease on HD who presented to the emergency room twice over the past few weeks with right inguinal pain and found to have a right inguinal hernia which was able to be reduced.  He is accompanied by his wife. He states that he has had a known inguinal hernia for many years but it just started bothering him recently. States that he has been having severe pain in his right groin. He is actually gone to the emergency room twice regarding it. He describes it as a sharp burning stabbing pain. Sitting in the dialysis chair aggravates it. He states this been going on for about 1 month. No nausea or vomiting. No diarrhea or constipation. He was in the process of being evaluated for kidney transplant but was told that he would have to be reconsidered at a later date. He states something about his stress test. He is not on any blood thinners other than aspirin. No recent smoking. No prior abdominal surgery. He denies chest pain, chest pressure, shortness of breath, dyspnea on exertion. No TIAs or amaurosis fugax.   Past Medical History:  Diagnosis Date   Anemia in chronic kidney disease (CKD)    stage 5   Anxiety    Arthritis    CHF (congestive heart failure) (HCC) 05/28/2018   dx 05/28/18   Chronic bilateral pleural effusions    Diabetes mellitus without complication (HCC)    Hyperlipidemia    Hypertension    Iron deficiency anemia    Neuromuscular disorder (HCC)    neuropathy feet/hands   Perforating neurotrophic ulcer of foot (HCC)    Stage 4 chronic  kidney disease (HCC)     Past Surgical History:  Procedure Laterality Date   ACHILLES TENDON REPAIR Bilateral    AV FISTULA PLACEMENT Left 03/21/2021   Procedure: LEFT BRACHIOCEPHALIC ARTERIOVENOUS (AV) FISTULA CREATION;  Surgeon: Leonie Douglas, MD;  Location: [facility] OR;  Service: Vascular;  Laterality: Left;   BRONCHIAL BIOPSY  07/24/2021   Procedure: BRONCHIAL BIOPSIES;  Surgeon: Lorin Glass, MD;  Location: [facility] ENDOSCOPY;  Service: Pulmonary;;   BRONCHIAL BRUSHINGS Right 07/24/2021   Procedure: BRONCHIAL BRUSHINGS;  Surgeon: Lorin Glass, MD;  Location: [facility] ENDOSCOPY;  Service: Pulmonary;  Laterality: Right;   BRONCHIAL WASHINGS  07/24/2021   Procedure: BRONCHIAL WASHINGS;  Surgeon: Lorin Glass, MD;  Location: [facility] ENDOSCOPY;  Service: Pulmonary;;   COLONOSCOPY     CYST EXCISION Left    lipoma removed from arm   IR THORACENTESIS ASP PLEURAL SPACE W/IMG GUIDE  12/26/2019   IR THORACENTESIS ASP PLEURAL SPACE W/IMG GUIDE  01/18/2020   THORACENTESIS  04/16/2020   VIDEO BRONCHOSCOPY Right 07/24/2021   Procedure: VIDEO BRONCHOSCOPY WITH FLUORO;  Surgeon: Lorin Glass, MD;  Location: [facility] ENDOSCOPY;  Service: Pulmonary;  Laterality: Right;    Family History  Problem Relation Age of Onset   Diabetes Mellitus II Mother    Hypertension Father     Social:  reports that he has quit smoking. His smoking use included  cigarettes. He has a 1.50 pack-year smoking history. He has never used smokeless tobacco. He reports that he does not drink alcohol and does not use drugs.  Allergies: No Known Allergies  Medications: I have reviewed the patient's current medications.   ROS - all of the below systems have been reviewed with the patient and positives are indicated with bold text General: chills, fever or night sweats Eyes: blurry vision or double vision ENT: epistaxis or sore throat Allergy/Immunology: itchy/watery eyes or nasal congestion Hematologic/Lymphatic: bleeding problems,  blood clots or swollen lymph nodes Endocrine: temperature intolerance or unexpected weight changes Breast: new or changing breast lumps or nipple discharge Resp: cough, shortness of breath, or wheezing CV: chest pain or dyspnea on exertion GI: as per HPI GU: dysuria, trouble voiding, or hematuria MSK: joint pain or joint stiffness Neuro: TIA or stroke symptoms Derm: pruritus and skin lesion changes Psych: anxiety and depression  PE Blood pressure (!) 159/67, pulse 80, temperature 98.1 F (36.7 C), resp. rate 18, height 5\' 10"  (1.778 m), weight 81.6 kg, SpO2 99 %. Constitutional: NAD; conversant; no deformities Eyes: Moist conjunctiva; no lid lag; anicteric; PERRL Neck: Trachea midline; no thyromegaly Lungs: Normal respiratory effort; no tactile fremitus CV: RRR; no palpable thrills; no pitting edema GI: Abd right groin bulge. ; no palpable hepatosplenomegaly MSK: Normal gait; no clubbing/cyanosis Psychiatric: Appropriate affect; alert and oriented x3 Lymphatic: No palpable cervical or axillary lymphadenopathy Skin:no rash/lesions/ +AV fistula  Results for orders placed or performed during the hospital encounter of 10/24/22 (from the past 48 hour(s))  Glucose, capillary     Status: Abnormal   Collection Time: 10/24/22  1:45 PM  Result Value Ref Range   Glucose-Capillary 135 (H) 70 - 99 mg/dL    Comment: Glucose reference range applies only to samples taken after fasting for at least 8 hours.  I-STAT, chem 8     Status: Abnormal   Collection Time: 10/24/22  1:57 PM  Result Value Ref Range   Sodium 138 135 - 145 mmol/L   Potassium 4.0 3.5 - 5.1 mmol/L   Chloride 96 (L) 98 - 111 mmol/L   BUN 32 (H) 8 - 23 mg/dL   Creatinine, Ser 1.61 (H) 0.61 - 1.24 mg/dL   Glucose, Bld 096 (H) 70 - 99 mg/dL    Comment: Glucose reference range applies only to samples taken after fasting for at least 8 hours.   Calcium, Ion 0.93 (L) 1.15 - 1.40 mmol/L   TCO2 29 22 - 32 mmol/L   Hemoglobin 13.3  13.0 - 17.0 g/dL   HCT 04.5 40.9 - 81.1 %    No results found.  Imaging: N/a  A/P: Melvin Mcclure is an 72 y.o. male with  Right inguinal hernia  Chronic diastolic CHF (congestive heart failure) (CMS/HHS-HCC)  ESRD (end stage renal disease) on dialysis (CMS/HHS-HCC)   2 OR for open repair right inguinal hernia with mesh IV antibiotic Enhanced recovery protocol Discussed [facility] surgical resident involvement with case All questions asked and answered  Mary Sella. Andrey Campanile, MD, FACS General, Bariatric, & Minimally Invasive Surgery [facility]

## 2022-10-24 NOTE — Op Note (Signed)
Open Right Direct Inguinal Hernia Repair with Mesh Procedure Note  Indications: The patient presented with a history of a right, reducible hernia.    Pre-operative Diagnosis: right reducible inguinal hernia  Post-operative Diagnosis: right direct inguinal hernia  Surgeon: Gaynelle Adu MD FACS  Assistants: Margarito Courser PGY-3 Circulator: Lennox Laity, RN Relief Scrub: Moss Mc, RN Scrub Person: Antony Contras E  Anesthesia: General endotracheal anesthesia + preoperative TAP block  Surgeon: Mary Sella. Andrey Campanile, MD, FACS  Procedure Details  The patient was seen again in the Holding Room. The risks, benefits, complications, treatment options, and expected outcomes were discussed with the patient. The possibilities of reaction to medication, pulmonary aspiration, perforation of viscus, bleeding, recurrent infection, the need for additional procedures, and development of a complication requiring transfusion or further operation were discussed with the patient and/or family. The likelihood of success in repairing the hernia and returning the patient to their previous functional status is good.  There was concurrence with the proposed plan, and informed consent was obtained. The site of surgery was properly noted/marked. The patient was taken to the Operating Room, identified as Melvin Mcclure, and the procedure verified as right inguinal hernia repair. A Time Out was held and the above information confirmed.  The patient was placed in the supine position and underwent induction of anesthesia. The lower abdomen and groin was prepped with Chloraprep and draped in the standard fashion, and 0.25% Marcaine with epinephrine was used to anesthetize the skin over the mid-portion of the inguinal canal. An oblique incision was made. Dissection was carried down through the subcutaneous tissue with cautery to the external oblique fascia.  We opened the external oblique fascia along the direction of its fibers  to the external ring.  The spermatic cord was circumferentially dissected bluntly and retracted with a Penrose drain.  The floor of the inguinal canal was inspected and there was a moderate sized direct defect.  We skeletonized the spermatic cord and there was no cord lipoma or indirect sac. The direct hernia was reduced from surrounding structures and reduced back into the abdomen. The floor was then reconstructed with a figure of eight suture between the shelving edge and conjoined tendon with a 2-0 prolene.  We used a 3 x 6 inch piece of Bard soft mesh, which was cut into a keyhole shape.  This was secured with 2-0 Prolene, beginning at the pubic tubercle, running this along the shelving edge inferiorly. Superiorly, the mesh was secured to the internal oblique fascia with interrupted 2-0 Prolene sutures.  The tails of the mesh were sutured together behind the spermatic cord.  The mesh was tucked underneath the external oblique fascia laterally.  The external oblique fascia was reapproximated with 2-0 Vicryl.  3-0 Vicryl was used to close the subcutaneous tissues and 4-0 Monocryl was used to close the skin in subcuticular fashion by the resident.  Benzoin and steri-strips were used to seal the incision.  A clean dressing was applied.  The patient was then extubated and brought to the recovery room in stable condition.  All sponge, instrument, and needle counts were correct prior to closure and at the conclusion of the case.   Estimated Blood Loss: Minimal                 Complications: None; patient tolerated the procedure well.         Disposition: PACU - hemodynamically stable.         Condition: stable  I  was personally present, scrubbed and performed certain portions of dissection/mobilization of the procedure and present during the entire procedure except for skin closure but was immediately available during that portion, as documented in my operative note.   Mary Sella. Andrey Campanile, MD, FACS General,  Bariatric, & Minimally Invasive Surgery [facility], [place]

## 2022-10-24 NOTE — Discharge Instructions (Signed)
UMBILICAL OR INGUINAL HERNIA REPAIR: POST OP INSTRUCTIONS  Always review your discharge instruction sheet given to you by the facility where your surgery was performed. IF YOU HAVE DISABILITY OR FAMILY LEAVE FORMS, YOU MUST BRING THEM TO THE OFFICE FOR PROCESSING.   DO NOT GIVE THEM TO YOUR DOCTOR.  A  prescription for pain medication may be given to you upon discharge.  Take your pain medication as prescribed, if needed.  If narcotic pain medicine is not needed, then you may take acetaminophen (Tylenol) or ibuprofen (Advil) as needed. Take your usually prescribed medications unless otherwise directed. If you need a refill on your pain medication, please contact your pharmacy.  They will contact our office to request authorization. Prescriptions will not be filled after 5 pm or on week-ends. You should follow a light diet the first 24 hours after arrival home, such as soup and crackers, etc.  Be sure to include lots of fluids daily.  Resume your normal diet the day after surgery. Most patients will experience some swelling and bruising around the umbilicus or in the groin and scrotum.  Ice packs and reclining will help.  Swelling and bruising can take several days to resolve.  It is common to experience some constipation if taking pain medication after surgery.  Increasing fluid intake and taking a stool softener (such as Colace) will usually help or prevent this problem from occurring.  A mild laxative (Milk of Magnesia or Miralax) should be taken according to package directions if there are no bowel movements after 48 hours. Unless discharge instructions indicate otherwise, you may remove your bandages 24-48 hours after surgery, and you may shower at that time.  You may have steri-strips (small skin tapes) in place directly over the incision.  These strips should be left on the skin for 7-10 days.  If your surgeon used skin glue on the incision, you may shower in 24 hours.  The glue will flake off  over the next 2-3 weeks.  Any sutures or staples will be removed at the office during your follow-up visit. ACTIVITIES:  You may resume regular (light) daily activities beginning the next day--such as daily self-care, walking, climbing stairs--gradually increasing activities as tolerated.  You may have sexual intercourse when it is comfortable.  Refrain from any heavy lifting or straining until approved by your doctor. You may drive when you are no longer taking prescription pain medication, you can comfortably wear a seatbelt, and you can safely maneuver your car and apply brakes. RETURN TO WORK:  You should see your doctor in the office for a follow-up appointment approximately 2-3 weeks after your surgery.  Make sure that you call for this appointment within a day or two after you arrive home to insure a convenient appointment time. OTHER INSTRUCTIONS:     WHEN TO CALL YOUR DOCTOR: Fever over 101.0 Inability to urinate Nausea and/or vomiting Extreme swelling or bruising Continued bleeding from incision. Increased pain, redness, or drainage from the incision  The clinic staff is available to answer your questions during regular business hours.  Please don't hesitate to call and ask to speak to one of the nurses for clinical concerns.  If you have a medical emergency, go to the nearest emergency room or call 911.  A surgeon from [facility] is always on call at the hospital   [address], [place], [place]  [address] ?  [address] [address], [place], [place]   [address] [phone] ?  [phone] ? FAX [phone] Web site: [email or web address]

## 2022-10-24 NOTE — Anesthesia Procedure Notes (Signed)
Anesthesia Regional Block: TAP block   Pre-Anesthetic Checklist: , timeout performed,  Correct Patient, Correct Site, Correct Laterality,  Correct Procedure, Correct Position, site marked,  Risks and benefits discussed,  Surgical consent,  Pre-op evaluation,  At surgeon's request and post-op pain management  Laterality: Right  Prep: chloraprep       Needles:  Injection technique: Single-shot  Needle Type: Echogenic Needle     Needle Length: 9cm  Needle Gauge: 21     Additional Needles:   Procedures:,,,, ultrasound used (permanent image in chart),,    Narrative:  Start time: 10/24/2022 3:33 PM End time: 10/24/2022 3:40 PM Injection made incrementally with aspirations every 5 mL.  Performed by: Personally  Anesthesiologist: Marcene Duos, MD

## 2022-10-25 NOTE — Anesthesia Postprocedure Evaluation (Signed)
Anesthesia Post Note  Patient: Kate Sweetman  Procedure(s) Performed: OPEN REPAIR RIGHT DIRECT INGUINAL HERNIA WITH MESH (Right)     Patient location during evaluation: PACU Anesthesia Type: General Level of consciousness: awake and alert Pain management: pain level controlled Vital Signs Assessment: post-procedure vital signs reviewed and stable Respiratory status: spontaneous breathing, nonlabored ventilation, respiratory function stable and patient connected to nasal cannula oxygen Cardiovascular status: blood pressure returned to baseline and stable Postop Assessment: no apparent nausea or vomiting Anesthetic complications: no   No notable events documented.  Last Vitals:  Vitals:   10/24/22 1745 10/24/22 1800  BP: (!) 156/76 (!) 168/74  Pulse: 80 78  Resp: 20 15  Temp:  36.7 C  SpO2: 100% 95%    Last Pain:  Vitals:   10/24/22 1800  PainSc: 0-No pain                 Minka Knight S

## 2022-10-26 ENCOUNTER — Encounter (HOSPITAL_COMMUNITY): Payer: Self-pay | Admitting: General Surgery

## 2022-11-19 ENCOUNTER — Other Ambulatory Visit: Payer: Self-pay | Admitting: Internal Medicine

## 2022-11-20 LAB — COMPLETE METABOLIC PANEL WITH GFR
AG Ratio: 0.9 (calc) — ABNORMAL LOW (ref 1.0–2.5)
ALT: 10 U/L (ref 9–46)
AST: 17 U/L (ref 10–35)
Albumin: 3.8 g/dL (ref 3.6–5.1)
Alkaline phosphatase (APISO): 66 U/L (ref 35–144)
BUN/Creatinine Ratio: 7 (calc) (ref 6–22)
BUN: 29 mg/dL — ABNORMAL HIGH (ref 7–25)
CO2: 30 mmol/L (ref 20–32)
Calcium: 8.2 mg/dL — ABNORMAL LOW (ref 8.6–10.3)
Chloride: 95 mmol/L — ABNORMAL LOW (ref 98–110)
Creat: 4.13 mg/dL — ABNORMAL HIGH (ref 0.70–1.28)
Globulin: 4.3 g/dL (calc) — ABNORMAL HIGH (ref 1.9–3.7)
Glucose, Bld: 132 mg/dL — ABNORMAL HIGH (ref 65–99)
Potassium: 3.9 mmol/L (ref 3.5–5.3)
Sodium: 140 mmol/L (ref 135–146)
Total Bilirubin: 0.5 mg/dL (ref 0.2–1.2)
Total Protein: 8.1 g/dL (ref 6.1–8.1)
eGFR: 15 mL/min/{1.73_m2} — ABNORMAL LOW (ref >=60)

## 2022-11-20 LAB — CBC
HCT: 38.2 % — ABNORMAL LOW (ref 38.5–50.0)
Hemoglobin: 12.5 g/dL — ABNORMAL LOW (ref 13.2–17.1)
MCH: 29.6 pg (ref 27.0–33.0)
MCHC: 32.7 g/dL (ref 32.0–36.0)
MCV: 90.3 fL (ref 80.0–100.0)
MPV: 10.5 fL (ref 7.5–12.5)
Platelets: 251 10*3/uL (ref 140–400)
RBC: 4.23 10*6/uL (ref 4.20–5.80)
RDW: 14.3 % (ref 11.0–15.0)
WBC: 7.1 10*3/uL (ref 3.8–10.8)

## 2022-11-20 LAB — LIPID PANEL
Cholesterol: 195 mg/dL (ref <200)
HDL: 40 mg/dL (ref >=40)
LDL Cholesterol (Calc): 125 mg/dL (calc) — ABNORMAL HIGH
Non-HDL Cholesterol (Calc): 155 mg/dL (calc) — ABNORMAL HIGH (ref <130)
Total CHOL/HDL Ratio: 4.9 (calc) (ref <5.0)
Triglycerides: 179 mg/dL — ABNORMAL HIGH (ref <150)

## 2022-11-20 LAB — PSA: PSA: 0.86 ng/mL (ref <=4.00)

## 2022-11-20 LAB — TSH: TSH: 2.37 mIU/L (ref 0.40–4.50)

## 2022-11-20 LAB — SPECIMEN COMPROMISED

## 2022-11-21 ENCOUNTER — Ambulatory Visit: Payer: Medicare HMO | Admitting: Podiatry

## 2022-12-08 ENCOUNTER — Ambulatory Visit (INDEPENDENT_AMBULATORY_CARE_PROVIDER_SITE_OTHER): Payer: Medicare HMO | Admitting: Podiatry

## 2022-12-08 ENCOUNTER — Encounter: Payer: Self-pay | Admitting: Podiatry

## 2022-12-08 DIAGNOSIS — M79674 Pain in right toe(s): Secondary | ICD-10-CM

## 2022-12-08 DIAGNOSIS — M79675 Pain in left toe(s): Secondary | ICD-10-CM

## 2022-12-08 DIAGNOSIS — B351 Tinea unguium: Secondary | ICD-10-CM

## 2022-12-08 DIAGNOSIS — E1100 Type 2 diabetes mellitus with hyperosmolarity without nonketotic hyperglycemic-hyperosmolar coma (NKHHC): Secondary | ICD-10-CM

## 2022-12-08 NOTE — Progress Notes (Signed)
This patient returns to my office for at risk foot care.  This patient requires this care by a professional since this patient will be at risk due to having diabetes and CKD.  This patient is unable to cut nails himself since the patient cannot reach his nails.These nails are painful walking and wearing shoes.  This patient presents for at risk foot care today.  General Appearance  Alert, conversant and in no acute stress.  Vascular  Dorsalis pedis and posterior tibial  pulses are weakly palpable  bilaterally.  Capillary return is within normal limits  bilaterally. Temperature is within normal limits  bilaterally.  Neurologic  Senn-Weinstein monofilament wire test within normal limits  bilaterally. Muscle power within normal limits bilaterally.  Nails Thick disfigured discolored nails with subungual debris  from hallux to fifth toes bilaterally. No evidence of bacterial infection or drainage bilaterally.  Orthopedic  No limitations of motion  feet .  No crepitus or effusions noted.  No bony pathology or digital deformities noted.  Skin  normotropic skin with no porokeratosis noted bilaterally.  No signs of infections or ulcers noted.     Onychomycosis  Pain in right toes  Pain in left toes  Consent was obtained for treatment procedures.   Mechanical debridement of nails 1-5  bilaterally performed with a nail nipper.  Filed with dremel without incident.    Return office visit    10 weeks                  Told patient to return for periodic foot care and evaluation due to potential at risk complications.     DPM   

## 2022-12-31 NOTE — Progress Notes (Unsigned)
Melvin Mcclure, male    DOB: 25-Aug-1950    MRN: [id number]   Brief patient profile:  72 yobm quit smoking in the 1970s/ wife is RN played [employer] and worked as Emergency planning/management officer with new onset sob fall 2019 dx chf /cri and placed on 02 p last admit   Admit date: 01/17/2020 Discharge date: 01/23/2020   Home Health: No Equipment/Devices: Oxygen, 3 L per nasal cannula    History of present illness:   Melvin Mcclure is a 72 year old male with past medical history notable for CKD stage IV, diastolic congestive heart failure, essential hypertension, type 2 diabetes mellitus with recurrent hospitalizations who was sent in from his cardiologist office for shortness of breath after being found to have recurrent right pleural effusion with associated hypoxia.  In the ED, SPO2 was noted to be in the 80s on room air, and in the mid 90s on 2 L nasal cannula.  Nephrology and cardiology were consulted.  TRH consulted for admission.   Hospital course:   Acute hypoxic respiratory failure, present on admission Recurrent right pleural effusion Chronic diastolic congestive heart failure, decompensated Patient presenting from cardiology office with shortness of breath and found to be hypoxic with SPO2 in the low 80s on room air.  Patient was noted to have a recurrent right pleural effusion and underwent thoracentesis on 01/18/2020 with 1.75 L removed .  Volume overload is complicated by his diastolic heart dysfunction as well as his severe kidney disease.  Nephrology and cardiology were consulted and followed during hospital course.  Patient was started on aggressive IV diuresis with furosemide with good output and was net negative 8.4 L during the hospitalization.  Patient was then transitioned to torsemide at a higher dose of 40 mg p.o. twice daily; which he will continue outpatient in addition to metolazone 5 mg p.o. twice weekly as needed for increased shortness of breath, weight gain, edema.  Patient will need  home supplemental oxygen at 3 L per nasal cannula with exertion.  Will need close outpatient follow-up with both cardiology and nephrology following discharge.  Ultimately, patient will likely need hemodialysis in the near future given his recurrent hospitalizations and his difficulty managing his overall volume with diuretics given his severe renal insufficiency.   Acute renal failure on CKD stage IV Nephrology following as above Bridge assistance, follows outpatient with Melvin Mcclure.  Increased home torsemide to 40 mg p.o. twice daily with occasional twice weekly metolazone as above.  Given his recurrent hospitalizations as above with difficulty managing his volume overload with his renal insufficiency, likely will need HD in the near future.  Outpatient follow-up with Melvin Mcclure.   Type 2 diabetes mellitus Hemoglobin A1c 6.6 on 12/29/2019, well controlled.  Diet controlled at home.   Essential hypertension Continue home amlodipine 10 mg p.o. daily, Carvedilol 12.5 mg p.o. twice daily, Hydralazine 50 mg p.o. every 8 hours.  Continue aspirin.  Not on statin at home.  Follow-up with cardiology.   Discharge Diagnoses:  Principal Problem:   Acute respiratory failure with hypoxia (HCC)   Acute diastolic CHF (congestive heart failure) (HCC)   DM2 (diabetes mellitus, type 2) (HCC)   CKD stage 4 due to type 2 diabetes mellitus (HCC)   Hypertensive cardiomyopathy, with heart failure (HCC)   Recurrent right pleural effusion   HTN (hypertension)      History of Present Illness  04/12/2020  Pulmonary/ 1st office eval/Melvin Mcclure  Chief Complaint  Patient presents with   Consult  Shortness of breath at rest at around bedtime  Dyspnea:  Room to room  Cough: none  Sleep: bed is flat wedge x about 30 degrees  SABA use:  02 back on 2-4 lpm p 12/4 started needing 02 and zeroxyln x 2 and better on day of ov rec Re-tap prn    - cxr 07/20/2020 improved but esr 51   Quant TB neg/ collagen vasc screen neg      05/24/2021  f/u ov/Melvin Mcclure re: R effusion/ 02 dep maint on prn 02 /  has not started  HD yet Chief Complaint  Patient presents with   Acute Visit    Pt c/o non prod cough, weight gain- had cxr done 05/23/21 and was told he may have pna.   Dyspnea:  around mid Dec 2022 indolent onset progressive doe across a parking lot assoc with abd swelling but not ext swelling ("never does have per wife/ RN) Cough: not much cough at all / no fever or cp or unintended wt loss Sleeping: flat position/ big pillow SABA use: none  02: not checking while walking, using per per wife  Covid status:   vax  x 5  Rec Make sure you check your oxygen saturation  AT  your highest level of activity (not after you stop)   to be sure it stays over 90%    - 05/24/2021 rec re tap - 05/28/21 R T centesis x 470 cc exudate with  L > poly's  Flow cytometry>>> neg - 07/24/21 inpt w/u by Melvin Mcclure also neg for ca included FOB/tbbx    08/14/22 CT cuts on abd study: Lower chest: Consolidation of the dependent right lung, appearance strongly suggestive of rounded atelectasis. Elevation of the right hemidiaphragm. No significant effusion    01/02/2023   re-establish  ov/[place] office/Melvin Mcclure re: abnormal CXR/ CT /  HD Tues/thurs/ sat  Needs pfts and 6 m walk in consideration of renal transplant  Chief Complaint  Patient presents with   Follow-up    Doing well.  Dyspnea: walking neighborhood  up to 10-15 min stops due to  tired in legs  Cough: none  Sleeping: flat bed, wedge one pillow no resp cc  SABA use: none 02: none > 1 year   No obvious day to day or daytime variability or assoc excess/ purulent sputum or mucus plugs or hemoptysis or cp or chest tightness, subjective wheeze or overt sinus or hb symptoms.    Also denies any obvious fluctuation of symptoms with weather or environmental changes or other aggravating or alleviating factors except as outlined above   No unusual exposure hx or h/o childhood pna/ asthma or  knowledge of premature birth.  Current Allergies, Complete Past Medical History, Past Surgical History, Family History, and Social History were reviewed in [employer] record.  ROS  The following are not active complaints unless bolded Hoarseness, sore throat, dysphagia, dental problems, itching, sneezing,  nasal congestion or discharge of excess mucus or purulent secretions, ear ache,   fever, chills, sweats, unintended wt loss or wt gain, classically pleuritic or exertional cp,  orthopnea pnd or arm/hand swelling  or leg swelling, presyncope, palpitations, abdominal pain, anorexia, nausea, vomiting, diarrhea  or change in bowel habits or change in bladder habits, change in stools or change in urine, dysuria, hematuria,  rash, arthralgias, visual complaints, headache, numbness, weakness or ataxia or problems with walking or coordination,  change in mood or  memory.  Current Meds  Medication Sig   acetaminophen (TYLENOL) 500 MG tablet Take 2 tablets (1,000 mg total) by mouth every 6 (six) hours.   aspirin 81 MG chewable tablet Chew 81 mg by mouth daily.   blood glucose meter kit and supplies KIT Dispense based on patient and insurance preference. Use up to four times daily as directed. (FOR ICD-9 250.00, 250.01).   calcium carbonate (TUMS - DOSED IN MG ELEMENTAL CALCIUM) 500 MG chewable tablet Chew 2 tablets by mouth at bedtime.   diclofenac Sodium (VOLTAREN) 1 % GEL Apply 2 g topically 4 (four) times daily. (Patient taking differently: Apply 2 g topically 4 (four) times daily as needed (pain).)   insulin glargine, 2 Unit Dial, (TOUJEO MAX SOLOSTAR) 300 UNIT/ML Solostar Pen Inject 18 Units into the skin every morning. (Patient taking differently: Inject 10-15 Units into the skin every morning.)   meclizine (ANTIVERT) 25 MG tablet Take 1 tablet (25 mg total) by mouth 3 (three) times daily. (Patient taking differently: Take 25 mg by mouth 2 (two) times daily.)    polyethylene glycol (MIRALAX / GLYCOLAX) 17 g packet Take 17 g by mouth daily. (Patient taking differently: Take 17 g by mouth daily as needed.)   senna-docusate (SENOKOT-S) 8.6-50 MG tablet Take 2 tablets by mouth 2 (two) times daily.   Torsemide 40 MG TABS Take 40 mg by mouth every other day. Take 40 mg on Non- Dyalisis Days            Past Medical History:  Diagnosis Date   Anemia in chronic kidney disease (CKD)    CHF (congestive heart failure) (HCC) 05/28/2018   dx 05/28/18   Diabetes mellitus without complication (HCC)    Hyperlipidemia    Hypertension    Iron deficiency anemia    Perforating neurotrophic ulcer of foot (HCC)            Objective:    Wts  01/02/2023       183  05/24/2021       214    06/06/20 201 lb (91.2 kg)  04/30/20 199 lb 12.8 oz (90.6 kg)  04/21/20 198 lb 12.8 oz (90.2 kg)      Vital signs reviewed  01/02/2023  - Note at rest 02 sats  100% on RA   General appearance:      Amb bm nad   HEENT : Oropharynx  clear         NECK :  without  apparent JVD/ palpable Nodes/TM    LUNGS: no acc muscle use,  Nl contour chest with decreased bs/ dullness R base    CV:  RRR  no s3 or murmur or increase in P2, and no edema   ABD:  soft and nontender with nl inspiratory excursion in the supine position. No bruits or organomegaly appreciated   MS:  Nl gait/ ext warm without deformities Or obvious joint restrictions  calf tenderness, cyanosis or clubbing    SKIN: warm and dry without lesions    NEURO:  alert, approp, nl sensorium with  no motor or cerebellar deficits apparent.                   Assessment

## 2023-01-02 ENCOUNTER — Ambulatory Visit: Payer: Medicare HMO | Admitting: Internal Medicine

## 2023-01-02 ENCOUNTER — Encounter: Payer: Self-pay | Admitting: Internal Medicine

## 2023-01-02 VITALS — BP 124/74 | HR 72 | Temp 98.4°F | Ht 71.0 in | Wt 183.8 lb

## 2023-01-02 DIAGNOSIS — J90 Pleural effusion, not elsewhere classified: Secondary | ICD-10-CM

## 2023-01-02 NOTE — Patient Instructions (Signed)
My office will be contacting you by phone for referral for PFTs and 6 min walk   - if you don't hear back from my office within one week please call us back or notify us thru MyChart and we'll address it right away.    You can return to [facility] once we have the tests back to see they will clear you for surgery but I suspect they will want you seen by one of their pulmonary doctors

## 2023-01-03 NOTE — Assessment & Plan Note (Addendum)
R  thoracentesis on 12/26/2019  X 700 cc  Exudative with wbc 1429 and 78% L/ neg cytology  -  R  thoracentesis on 01/18/2020 with 1.75 L removed  -  R T centesis  04/16/20 x  2 liters >>>  Exudate  With nl glucose  wbc 867   80% Lymphs >  Cyt neg/ flow cytometry >>> not done  - CT  04/18/20 1. Right lower lobe infiltrate/consolidation/collapse. Some dependent infiltrate/volume loss in the right upper lobe. Moderate hazy pneumonia in the right middle lobe. Moderate size right effusion layering dependently without evidence of loculation. Findings may be due 2 pneumonia, but the possibility of tumor in the right inferior hilar region is not excluded by this exam. 2. Dependent atelectasis or infiltrate in the left lower lobe. - CT 04/29/21 slt worse effusion - cxr 07/20/2020 improved but esr 51   Quant TB neg/ collagen vasc screen neg    Symptoms worse since mid dec 2022 assoc with increasing abd girth/ wt gain - 05/24/2021 rec re tap - 05/28/21 R T centesis x 470 cc exudate with  L > poly's  Flow cytometry>>> neg - 07/24/21 inpt w/u by Myrla Halsted also neg for ca on FOB/TBBX - 08/14/22 CT chest cuts on abn study show roundied atx/ no effusion  Most likely the atx in the R base is permanent and may be a contraindication to renal transplant but is not presently a concern at all in term of   activity tolerance or any other resp symptoms so rec  1) no further f/u from our perspective as likely this is sequelae of chronic benign relaxation atx for an effusion of unknown origin that resolved.  2) will do pfts and 6 m walk in [place]  and refer back to [facility] with the data-  most likely they will want him to see one of their pulmonologists re feasibility of renal transplant in this setting.  Pulmonary f/u in this clinic is prn          Each maintenance medication was reviewed in detail including emphasizing most importantly the difference between maintenance and prns and under what circumstances the prns are to  be triggered using an action plan format where appropriate.  Total time for H and P, chart review, counseling,  and generating customized AVS unique to this office visit / same day charting = 32 min

## 2023-01-23 ENCOUNTER — Telehealth: Payer: Self-pay | Admitting: Internal Medicine

## 2023-01-23 NOTE — Telephone Encounter (Signed)
Patient wife is calling because he is in need of a PFT and a Walk. He has a PFT scheduled in [facility] but still needs a 6 min Walk. He needs these two things to qualify at the [facility]. Preferably on a Monday Wednesday or Friday due to diaclasis. Please call and advise.

## 2023-01-28 NOTE — Telephone Encounter (Signed)
6mw scheduled for 02/06/23

## 2023-01-28 NOTE — Telephone Encounter (Signed)
Paula Compton wife would like patient to be scheduled for 6 minute walk. Paula Compton phone number is [phone].

## 2023-02-05 ENCOUNTER — Encounter (HOSPITAL_BASED_OUTPATIENT_CLINIC_OR_DEPARTMENT_OTHER): Payer: Medicare HMO

## 2023-02-06 ENCOUNTER — Ambulatory Visit (INDEPENDENT_AMBULATORY_CARE_PROVIDER_SITE_OTHER): Payer: Medicare HMO | Admitting: Internal Medicine

## 2023-02-06 DIAGNOSIS — J90 Pleural effusion, not elsewhere classified: Secondary | ICD-10-CM

## 2023-02-06 DIAGNOSIS — J9611 Chronic respiratory failure with hypoxia: Secondary | ICD-10-CM

## 2023-02-06 NOTE — Progress Notes (Signed)
Six Minute Walk - 02/06/23 1007       Six Minute Walk   Medications taken before test (dose and time) Acetaminophen 500 mg, Asprin 81 mg, Calcium Caronate 500 mg, Meclizine 25 mg, Senna-docusate 8.6-50 mg, Torsemide 40 mg    Supplemental oxygen during test? No    Lap distance in meters  34 meters    Laps Completed 20    Partial lap (in meters) 16 meters    Baseline BP (sitting) 120/80    Baseline Heartrate 76    Baseline Dyspnea (Borg Scale) 0    Baseline Fatigue (Borg Scale) 0    Baseline SPO2 97 %      End of Test Values    BP (sitting) 140/80    Heartrate 82    Dyspnea (Borg Scale) 1    Fatigue (Borg Scale) 1    SPO2 99 %      2 Minutes Post Walk Values   BP (sitting) 110/70    Heartrate 75    SPO2 99 %    Stopped or paused before six minutes? No      Interpretation   Distance completed 696 meters    Tech Comments: Walked at an average pace with no stops.

## 2023-02-07 NOTE — Assessment & Plan Note (Signed)
Desat 05/09/2020- overnight oximetry on room air-duration of sleep 6 hours and 7 minutes-time spent below 88% 315 minutes, average oxygenation 85% rec  start 2 L of O2 at night based off this test > done 06/05/20  -  06/06/2020   Walked RA  2 laps @ approx 237ft each @modpace   stopped due to end of study, min  sob  And sats 93% at end  - ONO on 2lpm  06/05/20  desats x 3 min > no change rx  - 6mw 02/06/2023   x 696 m avg pace and sats 97% at lowest

## 2023-02-16 ENCOUNTER — Ambulatory Visit (INDEPENDENT_AMBULATORY_CARE_PROVIDER_SITE_OTHER): Payer: Medicare HMO | Admitting: Podiatry

## 2023-02-16 ENCOUNTER — Encounter: Payer: Self-pay | Admitting: Podiatry

## 2023-02-16 DIAGNOSIS — M79675 Pain in left toe(s): Secondary | ICD-10-CM

## 2023-02-16 DIAGNOSIS — B351 Tinea unguium: Secondary | ICD-10-CM

## 2023-02-16 DIAGNOSIS — E1100 Type 2 diabetes mellitus with hyperosmolarity without nonketotic hyperglycemic-hyperosmolar coma (NKHHC): Secondary | ICD-10-CM | POA: Diagnosis not present

## 2023-02-16 DIAGNOSIS — M79674 Pain in right toe(s): Secondary | ICD-10-CM | POA: Diagnosis not present

## 2023-02-16 NOTE — Progress Notes (Signed)
This patient returns to my office for at risk foot care.  This patient requires this care by a professional since this patient will be at risk due to having diabetes and CKD.  This patient is unable to cut nails himself since the patient cannot reach his nails.These nails are painful walking and wearing shoes.  This patient presents for at risk foot care today.  General Appearance  Alert, conversant and in no acute stress.  Vascular  Dorsalis pedis and posterior tibial  pulses are weakly palpable  bilaterally.  Capillary return is within normal limits  bilaterally. Temperature is within normal limits  bilaterally.  Neurologic  Senn-Weinstein monofilament wire test within normal limits  bilaterally. Muscle power within normal limits bilaterally.  Nails Thick disfigured discolored nails with subungual debris  from hallux to fifth toes bilaterally. No evidence of bacterial infection or drainage bilaterally.  Orthopedic  No limitations of motion  feet .  No crepitus or effusions noted.  No bony pathology or digital deformities noted.  Skin  normotropic skin with no porokeratosis noted bilaterally.  No signs of infections or ulcers noted.     Onychomycosis  Pain in right toes  Pain in left toes  Consent was obtained for treatment procedures.   Mechanical debridement of nails 1-5  bilaterally performed with a nail nipper.  Filed with dremel without incident.    Return office visit    10 weeks                  Told patient to return for periodic foot care and evaluation due to potential at risk complications.   Helane Gunther DPM

## 2023-03-23 NOTE — Progress Notes (Signed)
HPI: Follow-up chronic diastolic congestive heart failure.  Also with chronic stage IV kidney disease.  Nuclear study March 2020 showed ejection fraction 57%, diaphragmatic attenuation but no ischemia. PYP scan August 2021 not suggestive of transthyretin amyloidosis.  Renal Dopplers August 2021 showed no renal artery stenosis. Echocardiogram December 2023 at [facility] showed normal LV function, moderate left ventricular hypertrophy, grade 1 diastolic dysfunction.  Stress echocardiogram December 2023 showed no ischemia.  Since last seen the patient denies any dyspnea on exertion, orthopnea, PND, pedal edema, palpitations, syncope or chest pain.   Current Outpatient Medications  Medication Sig Dispense Refill   acetaminophen (TYLENOL) 500 MG tablet Take 2 tablets (1,000 mg total) by mouth every 6 (six) hours. 12 tablet 0   aspirin 81 MG chewable tablet Chew 81 mg by mouth daily.     blood glucose meter kit and supplies KIT Dispense based on patient and insurance preference. Use up to four times daily as directed. (FOR ICD-9 250.00, 250.01). 1 each 0   calcium carbonate (TUMS - DOSED IN MG ELEMENTAL CALCIUM) 500 MG chewable tablet Chew 2 tablets by mouth at bedtime.     diclofenac Sodium (VOLTAREN) 1 % GEL Apply 2 g topically 4 (four) times daily. (Patient taking differently: Apply 2 g topically 4 (four) times daily as needed (pain).)     insulin glargine, 2 Unit Dial, (TOUJEO MAX SOLOSTAR) 300 UNIT/ML Solostar Pen Inject 18 Units into the skin every morning. (Patient taking differently: Inject 10-15 Units into the skin every morning.)     meclizine (ANTIVERT) 25 MG tablet Take 1 tablet (25 mg total) by mouth 3 (three) times daily. (Patient taking differently: Take 25 mg by mouth 2 (two) times daily.) 30 tablet 0   polyethylene glycol (MIRALAX / GLYCOLAX) 17 g packet Take 17 g by mouth daily. (Patient taking differently: Take 17 g by mouth daily as needed.) 14 each 0   senna-docusate (SENOKOT-S)  8.6-50 MG tablet Take 2 tablets by mouth 2 (two) times daily.     Torsemide 40 MG TABS Take 40 mg by mouth every other day. Take 40 mg on Non- Dyalisis Days     No current facility-administered medications for this visit.     Past Medical History:  Diagnosis Date   Anemia in chronic kidney disease (CKD)    stage 5   Anxiety    Arthritis    CHF (congestive heart failure) (HCC) 05/28/2018   dx 05/28/18   Chronic bilateral pleural effusions    Diabetes mellitus without complication (HCC)    Hyperlipidemia    Hypertension    Iron deficiency anemia    Neuromuscular disorder (HCC)    neuropathy feet/hands   Perforating neurotrophic ulcer of foot (HCC)    Stage 4 chronic kidney disease (HCC)     Past Surgical History:  Procedure Laterality Date   ACHILLES TENDON REPAIR Bilateral    AV FISTULA PLACEMENT Left 03/21/2021   Procedure: LEFT BRACHIOCEPHALIC ARTERIOVENOUS (AV) FISTULA CREATION;  Surgeon: Leonie Douglas, MD;  Location: [facility] OR;  Service: Vascular;  Laterality: Left;   BRONCHIAL BIOPSY  07/24/2021   Procedure: BRONCHIAL BIOPSIES;  Surgeon: Lorin Glass, MD;  Location: [facility] ENDOSCOPY;  Service: Pulmonary;;   BRONCHIAL BRUSHINGS Right 07/24/2021   Procedure: BRONCHIAL BRUSHINGS;  Surgeon: Lorin Glass, MD;  Location: [facility] ENDOSCOPY;  Service: Pulmonary;  Laterality: Right;   BRONCHIAL WASHINGS  07/24/2021   Procedure: BRONCHIAL WASHINGS;  Surgeon: Lorin Glass, MD;  Location: [facility]  ENDOSCOPY;  Service: Pulmonary;;   COLONOSCOPY     CYST EXCISION Left    lipoma removed from arm   INGUINAL HERNIA REPAIR Right 10/24/2022   Procedure: OPEN REPAIR RIGHT DIRECT INGUINAL HERNIA WITH MESH;  Surgeon: Gaynelle Adu, MD;  Location: [facility] OR;  Service: General;  Laterality: Right;   IR THORACENTESIS ASP PLEURAL SPACE W/IMG GUIDE  12/26/2019   IR THORACENTESIS ASP PLEURAL SPACE W/IMG GUIDE  01/18/2020   THORACENTESIS  04/16/2020   VIDEO BRONCHOSCOPY Right 07/24/2021   Procedure: VIDEO  BRONCHOSCOPY WITH FLUORO;  Surgeon: Lorin Glass, MD;  Location: [facility] ENDOSCOPY;  Service: Pulmonary;  Laterality: Right;    Social History   Socioeconomic History   Marital status: Married    Spouse name: Not on file   Number of children: 3   Years of education: Not on file   Highest education level: Not on file  Occupational History   Not on file  Tobacco Use   Smoking status: Former    Current packs/day: 0.00    Average packs/day: 1 pack/day for 1.5 years (1.5 ttl pk-yrs)    Types: Cigarettes    Quit date: 13    Years since quitting: 54.9   Smokeless tobacco: Never   Tobacco comments:    smoked for only 1.5 years in the 49's  Vaping Use   Vaping status: Never Used  Substance and Sexual Activity   Alcohol use: No    Alcohol/week: 0.0 standard drinks of alcohol   Drug use: No   Sexual activity: Not on file  Other Topics Concern   Not on file  Social History Narrative   Not on file   Social Determinants of Health   Financial Resource Strain: Not on file  Food Insecurity: Not on file  Transportation Needs: Not on file  Physical Activity: Not on file  Stress: Not on file  Social Connections: Unknown (08/06/2022)   Received from [facility], [facility]   Social Network    Social Network: Not on file  Intimate Partner Violence: Unknown (08/06/2022)   Received from [employer], [facility]   HITS    Physically Hurt: Not on file    Insult or Talk Down To: Not on file    Threaten Physical Harm: Not on file    Scream or Curse: Not on file    Family History  Problem Relation Age of Onset   Diabetes Mellitus II Mother    Hypertension Father     ROS: no fevers or chills, productive cough, hemoptysis, dysphasia, odynophagia, melena, hematochezia, dysuria, hematuria, rash, seizure activity, orthopnea, PND, pedal edema, claudication. Remaining systems are negative.  Physical Exam: Well-developed well-nourished in no acute distress.  Skin is warm and dry.   HEENT is normal.  Neck is supple.  Chest is clear to auscultation with normal expansion.  Cardiovascular exam is regular rate and rhythm.  Abdominal exam nontender or distended. No masses palpated. Extremities show no edema. neuro grossly intact  ECG- personally reviewed  A/P  1 chronic diastolic congestive heart failure-patient is euvolemic on examination.  Continue dialysis and present dose of diuretic.  2 hypertension-blood pressure controlled.  Continue present medical regimen.  3 end-stage renal disease-dialysis per nephrology.  4 hyperlipidemia-patient's last LDL greater than 100.  Given history of diabetes mellitus which is a coronary artery disease equivalent would add Crestor 40 mg daily.  Check lipids and liver in 8 weeks.  Olga Millers, MD

## 2023-03-27 ENCOUNTER — Encounter: Payer: Self-pay | Admitting: Cardiology

## 2023-03-27 ENCOUNTER — Ambulatory Visit: Payer: Medicare HMO | Attending: Cardiology | Admitting: Cardiology

## 2023-03-27 VITALS — BP 110/58 | HR 81 | Ht 70.0 in | Wt 183.0 lb

## 2023-03-27 DIAGNOSIS — I5032 Chronic diastolic (congestive) heart failure: Secondary | ICD-10-CM | POA: Diagnosis not present

## 2023-03-27 DIAGNOSIS — N186 End stage renal disease: Secondary | ICD-10-CM | POA: Diagnosis not present

## 2023-03-27 DIAGNOSIS — I10 Essential (primary) hypertension: Secondary | ICD-10-CM

## 2023-03-27 DIAGNOSIS — E785 Hyperlipidemia, unspecified: Secondary | ICD-10-CM

## 2023-03-27 MED ORDER — ROSUVASTATIN CALCIUM 40 MG PO TABS: 40.0000 mg | ORAL_TABLET | Freq: Every day | ORAL | 3 refills | Status: DC

## 2023-03-27 NOTE — Patient Instructions (Signed)
Medication Instructions:  Start Rosuvastatin 40 mg daily. New script sent. *If you need a refill on your cardiac medications before your next appointment, please call your pharmacy*   Lab Work: Lipid and Liver profile in 8 weeks. Due week of May 25, 2023 If you have labs (blood work) drawn today and your tests are completely normal, you will receive your results only by: MyChart Message (if you have MyChart) OR A paper copy in the mail If you have any lab test that is abnormal or we need to change your treatment, we will call you to review the results.     Follow-Up: At [facility], you and your health needs are our priority.  As part of our continuing mission to provide you with exceptional heart care, we have created designated Provider Care Teams.  These Care Teams include your primary Cardiologist (physician) and Advanced Practice Providers (APPs -  Physician Assistants and Nurse Practitioners) who all work together to provide you with the care you need, when you need it.  We recommend signing up for the patient portal called "MyChart".  Sign up information is provided on this After Visit Summary.  MyChart is used to connect with patients for Virtual Visits (Telemedicine).  Patients are able to view lab/test results, encounter notes, upcoming appointments, etc.  Non-urgent messages can be sent to your provider as well.   To learn more about what you can do with MyChart, go to [email or web address].    Your next appointment:   12 month(s)  Provider:   Olga Millers, MD

## 2023-04-27 ENCOUNTER — Ambulatory Visit: Payer: Medicare HMO | Admitting: Podiatry

## 2023-06-16 ENCOUNTER — Encounter: Payer: Self-pay | Admitting: *Deleted

## 2023-06-26 ENCOUNTER — Encounter (HOSPITAL_COMMUNITY): Payer: Self-pay

## 2023-06-26 ENCOUNTER — Ambulatory Visit (HOSPITAL_COMMUNITY): Admit: 2023-06-26 | Payer: Medicare HMO | Admitting: Internal Medicine

## 2023-06-26 SURGERY — A/V FISTULAGRAM
Anesthesia: LOCAL

## 2023-07-13 ENCOUNTER — Encounter: Payer: Self-pay | Admitting: Internal Medicine

## 2023-09-30 ENCOUNTER — Encounter (HOSPITAL_COMMUNITY): Payer: Self-pay | Admitting: Nephrology

## 2023-09-30 DIAGNOSIS — Z01818 Encounter for other preprocedural examination: Secondary | ICD-10-CM

## 2023-10-08 ENCOUNTER — Other Ambulatory Visit (HOSPITAL_COMMUNITY): Payer: Self-pay | Admitting: Nephrology

## 2023-10-08 DIAGNOSIS — Z7682 Awaiting organ transplant status: Secondary | ICD-10-CM

## 2023-10-08 DIAGNOSIS — Z940 Kidney transplant status: Secondary | ICD-10-CM

## 2023-12-10 NOTE — Telephone Encounter (Signed)
 LVM for patient to call back and discuss scheduling Kidney Txp stress test and sw visit

## 2023-12-14 ENCOUNTER — Telehealth: Payer: Self-pay

## 2023-12-14 NOTE — Telephone Encounter (Signed)
 Copied from CRM [id number]. Topic: Clinical - Medication Question >> Dec 11, 2023  2:50 PM Rozanna G wrote: Reason for CRM: PT SPOUSE WOULD LIKE A CALL BEFORE SCHEUDLING THE PFT. THANKS  Left message for patient's wife Johnie Makki (on [place]) to call back regarding scheduling PFT.

## 2023-12-15 NOTE — Telephone Encounter (Signed)
 Left message x 2 for patients wife, Romond Pipkins, to call back regarding questions on scheduling a PFT.

## 2023-12-17 NOTE — Telephone Encounter (Signed)
 Left message x 3 for patient to call back regarding scheduling a PFT.

## 2023-12-23 ENCOUNTER — Other Ambulatory Visit (HOSPITAL_COMMUNITY)

## 2024-01-01 ENCOUNTER — Encounter (HOSPITAL_COMMUNITY): Payer: Self-pay

## 2024-01-02 ENCOUNTER — Telehealth (HOSPITAL_COMMUNITY): Payer: Self-pay | Admitting: Nephrology

## 2024-01-02 NOTE — Telephone Encounter (Signed)
 Received secure message that [name] needs renal clearance to proceed with stress test.  CLEARED from a renal standpoint.  Pls call with any questions.  Izetta Boehringer, PA-C [employer] Pager [phone]

## 2024-01-05 ENCOUNTER — Other Ambulatory Visit (HOSPITAL_COMMUNITY): Payer: Self-pay | Admitting: *Deleted

## 2024-01-05 ENCOUNTER — Telehealth (HOSPITAL_COMMUNITY): Payer: Self-pay | Admitting: Emergency Medicine

## 2024-01-05 DIAGNOSIS — I110 Hypertensive heart disease with heart failure: Secondary | ICD-10-CM

## 2024-01-05 NOTE — Telephone Encounter (Signed)
Attempted to call patient regarding upcoming cardiac PET appointment. Left message on voicemail with name and callback number Aidan Moten RN Navigator Cardiac Imaging [facility] [phone] Office [phone] Cell  

## 2024-01-06 ENCOUNTER — Encounter (HOSPITAL_COMMUNITY)
Admission: RE | Admit: 2024-01-06 | Discharge: 2024-01-06 | Disposition: A | Discharge status: Home / Self Care | Source: Ambulatory Visit | Attending: Nephrology | Admitting: Nephrology

## 2024-01-06 DIAGNOSIS — Z7682 Awaiting organ transplant status: Secondary | ICD-10-CM | POA: Insufficient documentation

## 2024-01-06 DIAGNOSIS — I2510 Atherosclerotic heart disease of native coronary artery without angina pectoris: Secondary | ICD-10-CM | POA: Insufficient documentation

## 2024-01-06 DIAGNOSIS — Z940 Kidney transplant status: Secondary | ICD-10-CM | POA: Diagnosis present

## 2024-01-06 DIAGNOSIS — J9811 Atelectasis: Secondary | ICD-10-CM | POA: Insufficient documentation

## 2024-01-06 DIAGNOSIS — I429 Cardiomyopathy, unspecified: Secondary | ICD-10-CM | POA: Diagnosis not present

## 2024-01-06 DIAGNOSIS — I43 Cardiomyopathy in diseases classified elsewhere: Secondary | ICD-10-CM

## 2024-01-06 LAB — NM PET CT CARDIAC PERFUSION MULTI W/ABSOLUTE BLOODFLOW
LV dias vol: 120 mL (ref 62–150)
LV sys vol: 57 mL (ref 4.2–5.8)
MBFR: 1.55
Nuc Rest EF: 53 %
Nuc Stress EF: 53 %
Peak HR: 85 {beats}/min
Rest HR: 71 {beats}/min
Rest MBF: 0.91 ml/g/min
Rest Nuclear Isotope Dose: 21.6 mCi
SDS: 9
SRS: 0
SSS: 9
ST Depression (mm): 0 mm
Stress MBF: 1.41 ml/g/min
Stress Nuclear Isotope Dose: 21.3 mCi

## 2024-01-06 MED ORDER — REGADENOSON 0.4 MG/5ML IV SOLN
0.4000 mg | Freq: Once | INTRAVENOUS | Status: AC
  Administered 2024-01-06: 0.4 mg via INTRAVENOUS

## 2024-01-06 MED ORDER — REGADENOSON 0.4 MG/5ML IV SOLN
INTRAVENOUS | Status: AC
  Filled 2024-01-06: qty 5

## 2024-01-06 MED ORDER — RUBIDIUM RB82 GENERATOR (RUBYFILL)
21.6100 | PACK | Freq: Once | INTRAVENOUS | Status: AC
  Administered 2024-01-06: 21.61 via INTRAVENOUS

## 2024-01-06 MED ORDER — RUBIDIUM RB82 GENERATOR (RUBYFILL)
21.3400 | PACK | Freq: Once | INTRAVENOUS | Status: AC
  Administered 2024-01-06: 21.34 via INTRAVENOUS

## 2024-03-21 ENCOUNTER — Other Ambulatory Visit: Payer: Self-pay | Admitting: Cardiology

## 2024-03-22 ENCOUNTER — Other Ambulatory Visit: Payer: Self-pay | Admitting: Cardiology

## 2024-03-23 ENCOUNTER — Other Ambulatory Visit: Payer: Self-pay | Admitting: Cardiology

## 2024-04-25 ENCOUNTER — Other Ambulatory Visit: Payer: Self-pay | Admitting: Cardiology

## 2024-05-26 ENCOUNTER — Other Ambulatory Visit: Payer: Self-pay | Admitting: Cardiology

## 2024-05-27 ENCOUNTER — Encounter (HOSPITAL_COMMUNITY)
Admission: RE | Disposition: A | Discharge status: Home / Self Care | Payer: Self-pay | Source: Home / Self Care | Attending: Vascular Surgery

## 2024-05-27 ENCOUNTER — Other Ambulatory Visit: Payer: Self-pay

## 2024-05-27 ENCOUNTER — Ambulatory Visit (HOSPITAL_COMMUNITY)
Admission: RE | Admit: 2024-05-27 | Discharge: 2024-05-27 | Disposition: A | Discharge status: Home / Self Care | Attending: Vascular Surgery | Admitting: Vascular Surgery

## 2024-05-27 DIAGNOSIS — I132 Hypertensive heart and chronic kidney disease with heart failure and with stage 5 chronic kidney disease, or end stage renal disease: Secondary | ICD-10-CM | POA: Diagnosis not present

## 2024-05-27 DIAGNOSIS — I871 Compression of vein: Secondary | ICD-10-CM | POA: Diagnosis not present

## 2024-05-27 DIAGNOSIS — T82858A Stenosis of vascular prosthetic devices, implants and grafts, initial encounter: Secondary | ICD-10-CM | POA: Diagnosis not present

## 2024-05-27 DIAGNOSIS — Z87891 Personal history of nicotine dependence: Secondary | ICD-10-CM | POA: Diagnosis not present

## 2024-05-27 DIAGNOSIS — T82510A Breakdown (mechanical) of surgically created arteriovenous fistula, initial encounter: Secondary | ICD-10-CM | POA: Insufficient documentation

## 2024-05-27 DIAGNOSIS — E1122 Type 2 diabetes mellitus with diabetic chronic kidney disease: Secondary | ICD-10-CM | POA: Diagnosis not present

## 2024-05-27 DIAGNOSIS — Z992 Dependence on renal dialysis: Secondary | ICD-10-CM | POA: Diagnosis not present

## 2024-05-27 DIAGNOSIS — N186 End stage renal disease: Secondary | ICD-10-CM

## 2024-05-27 DIAGNOSIS — Y832 Surgical operation with anastomosis, bypass or graft as the cause of abnormal reaction of the patient, or of later complication, without mention of misadventure at the time of the procedure: Secondary | ICD-10-CM | POA: Insufficient documentation

## 2024-05-27 MED ORDER — HEPARIN (PORCINE) IN NACL 1000-0.9 UT/500ML-% IV SOLN
INTRAVENOUS | Status: DC | PRN
  Administered 2024-05-27: 500 mL

## 2024-05-27 MED ORDER — IODIXANOL 320 MG/ML IV SOLN
INTRAVENOUS | Status: DC | PRN
  Administered 2024-05-27: 35 mL via INTRAVENOUS

## 2024-05-27 MED ORDER — LIDOCAINE HCL (PF) 1 % IJ SOLN
INTRAMUSCULAR | Status: AC
  Filled 2024-05-27: qty 30

## 2024-05-27 MED ORDER — LIDOCAINE HCL (PF) 1 % IJ SOLN
INTRAMUSCULAR | Status: DC | PRN
  Administered 2024-05-27: 2 mL via SUBCUTANEOUS

## 2024-05-27 NOTE — Op Note (Signed)
" ° ° °  Patient name: Melvin Mcclure MRN: [id number] DOB: 25-Oct-1950 Sex: male  05/27/2024 Pre-operative Diagnosis:  End-stage renal disease, malfunctioning left arm AV fistula Post-operative diagnosis:  Same Surgeon:  Riis BROCKS. Sheree, MD Procedure Performed: 1.  Percutaneous ultrasound-guided cannulation left arm AV fistula 2.  Left upper extremity fistulogram 3.  Plain balloon angioplasty left cephalic arch with 7 and 9 mm Mustang balloons  Indications: 74 year old male with history of end-stage renal disease on dialysis via left arm cephalic vein fistula.  He has pulsatility in the fistula as well as increased bleeding after dialysis.  We have discussed his options he is elected for fistulogram with possible intervention.  Findings: Just after the inflow of the fistula there is a large sidebranch but the fistula fills well up to the upper arm and is healthy up to the cephalic arch level where there is approximately 80% stenosis.  After balloon angioplasty with 9 mm balloon at burst pressure there was full effacement of the balloon and improved thrill at completion and minimal residual stenosis.   Procedure:  The patient was identified in the holding area and taken to the heart and vascular procedure room.  The patient was then placed supine on the table and prepped and draped in the usual sterile fashion.  A time out was called.  Ultrasound was used to evaluate the left arm AV fistula and the skin around the fistula was anesthetized with 1% lidocaine  and the fistula was cannulated with a micropuncture needle followed by wire and a sheath.  An ultrasound images saved to the permanent record.  We performed left upper extremity fistulogram.  With the above findings we placed a Glidewire followed by a 6 French sheath.  We then placed the Glidewire across the stenosis and centrally and then placed the catheter and confirmed intraluminal access and the wire was replaced.  We then performed balloon angioplasty  of the cephalic arch first with a 7 mm balloon to burst pressure followed by a 9 mm balloon to burst pressure.  At completion there was minimal less than 10% residual stenosis of the cephalic arch and much improved thrill throughout the fistula.  The wire and sheath were removed and the cannulation site was suture-ligated.  He tolerated the procedure well without immediate complication.  Contrast: 35 cc     Bridgett Hattabaugh C. Sheree, MD [facility] Office: [phone] Pager: [phone]   "

## 2024-05-27 NOTE — H&P (Signed)
 " H+P    History of Present Illness: This is a 74 y.o. male with history of end-stage renal disease on dialysis via left upper arm cephalic vein fistula.  This was placed in 2022.  He has undergone previous fistulogram on 1 occasion with unknown intervention.  He now is having prolonged bleeding after dialysis but it is running fine with dialysis and he completed full session yesterday.  Past Medical History:  Diagnosis Date   Anemia in chronic kidney disease (CKD)    stage 5   Anxiety    Arthritis    CHF (congestive heart failure) (HCC) 05/28/2018   dx 05/28/18   Chronic bilateral pleural effusions    Diabetes mellitus without complication (HCC)    Hyperlipidemia    Hypertension    Iron deficiency anemia    Neuromuscular disorder (HCC)    neuropathy feet/hands   Perforating neurotrophic ulcer of foot (HCC)    Stage 4 chronic kidney disease (HCC)     Past Surgical History:  Procedure Laterality Date   ACHILLES TENDON REPAIR Bilateral    AV FISTULA PLACEMENT Left 03/21/2021   Procedure: LEFT BRACHIOCEPHALIC ARTERIOVENOUS (AV) FISTULA CREATION;  Surgeon: Magda Debby SAILOR, MD;  Location: [facility] OR;  Service: Vascular;  Laterality: Left;   BRONCHIAL BIOPSY  07/24/2021   Procedure: BRONCHIAL BIOPSIES;  Surgeon: Claudene Toribio BROCKS, MD;  Location: [facility] ENDOSCOPY;  Service: Pulmonary;;   BRONCHIAL BRUSHINGS Right 07/24/2021   Procedure: BRONCHIAL BRUSHINGS;  Surgeon: Claudene Toribio BROCKS, MD;  Location: [facility] ENDOSCOPY;  Service: Pulmonary;  Laterality: Right;   BRONCHIAL WASHINGS  07/24/2021   Procedure: BRONCHIAL WASHINGS;  Surgeon: Claudene Toribio BROCKS, MD;  Location: [facility] ENDOSCOPY;  Service: Pulmonary;;   COLONOSCOPY     CYST EXCISION Left    lipoma removed from arm   INGUINAL HERNIA REPAIR Right 10/24/2022   Procedure: OPEN REPAIR RIGHT DIRECT INGUINAL HERNIA WITH MESH;  Surgeon: Tanda Locus, MD;  Location: [facility] OR;  Service: General;  Laterality: Right;   IR THORACENTESIS RIGHT ASP PLEURAL SPACE W/IMG  GUIDE  12/26/2019   IR THORACENTESIS RIGHT ASP PLEURAL SPACE W/IMG GUIDE  01/18/2020   THORACENTESIS  04/16/2020   VIDEO BRONCHOSCOPY Right 07/24/2021   Procedure: VIDEO BRONCHOSCOPY WITH FLUORO;  Surgeon: Claudene Toribio BROCKS, MD;  Location: [facility] ENDOSCOPY;  Service: Pulmonary;  Laterality: Right;    Allergies[1]  Prior to Admission medications  Medication Sig Start Date End Date Taking? Authorizing Provider  acetaminophen  (TYLENOL ) 500 MG tablet Take 2 tablets (1,000 mg total) by mouth every 6 (six) hours. 10/24/22   Tanda Locus, MD  aspirin  81 MG chewable tablet Chew 81 mg by mouth daily. 08/29/21   [provider]  blood glucose meter kit and supplies KIT Dispense based on patient and insurance preference. Use up to four times daily as directed. (FOR ICD-9 250.00, 250.01). 04/21/20   Akula, Vijaya, MD  calcium  carbonate (TUMS - DOSED IN MG ELEMENTAL CALCIUM ) 500 MG chewable tablet Chew 2 tablets by mouth at bedtime.    [provider]  diclofenac  Sodium (VOLTAREN ) 1 % GEL Apply 2 g topically 4 (four) times daily. Patient taking differently: Apply 2 g topically 4 (four) times daily as needed (pain). 12/31/19   Vann, Jessica U, DO  insulin  glargine, 2 Unit Dial , (TOUJEO  MAX SOLOSTAR) 300 UNIT/ML Solostar Pen Inject 18 Units into the skin every morning. Patient taking differently: Inject 10-15 Units into the skin every morning. 08/12/21   Krishnan, Gokul, MD  meclizine  (ANTIVERT ) 25 MG  tablet Take 1 tablet (25 mg total) by mouth 3 (three) times daily. Patient taking differently: Take 25 mg by mouth 2 (two) times daily. 12/31/19   Vann, Jessica U, DO  polyethylene glycol (MIRALAX  / GLYCOLAX ) 17 g packet Take 17 g by mouth daily. Patient taking differently: Take 17 g by mouth daily as needed. 08/12/21   Arlice Reichert, MD  rosuvastatin  (CRESTOR ) 40 MG tablet Take 1 tablet (40 mg total) by mouth daily. PATIENT MUST MAKE AN APPOINTMENT IN ORDER TO RECEIVE ADDITIONAL REFILLS, FIRST ATTEMPT.  04/25/24   Pietro Redell RAMAN, MD  senna-docusate (SENOKOT-S) 8.6-50 MG tablet Take 2 tablets by mouth 2 (two) times daily.    [provider]  Torsemide  40 MG TABS Take 40 mg by mouth every other day. Take 40 mg on Non- Dyalisis Days    [provider]    Social History   Socioeconomic History   Marital status: Married    Spouse name: Not on file   Number of children: 3   Years of education: Not on file   Highest education level: Not on file  Occupational History   Not on file  Tobacco Use   Smoking status: Former    Current packs/day: 0.00    Average packs/day: 1 pack/day for 1.5 years (1.5 ttl pk-yrs)    Types: Cigarettes    Quit date: 67    Years since quitting: 56.0   Smokeless tobacco: Never   Tobacco comments:    smoked for only 1.5 years in the 59's  Vaping Use   Vaping status: Never Used  Substance and Sexual Activity   Alcohol use: No    Alcohol/week: 0.0 standard drinks of alcohol   Drug use: No   Sexual activity: Not on file  Other Topics Concern   Not on file  Social History Narrative   Not on file   Social Drivers of Health   Tobacco Use: Low Risk (08/24/2023)   Received from [facility]   Patient History    Passive Exposure: Not on file    Smokeless Tobacco Use: Never    Smoking Tobacco Use: Never  Recent Concern: Tobacco Use - Medium Risk (07/13/2023)   Patient History    Smoking Tobacco Use: Former    Smokeless Tobacco Use: Never    Passive Exposure: Not on [place] Strain: Not on file  Food Insecurity: Not on file  Transportation Needs: Not on file  Physical Activity: Not on file  Stress: Not on file  Social Connections: Not on file  Intimate Partner Violence: Not on file  Depression (PHQ2-9): Low Risk (06/14/2021)   Depression (PHQ2-9)    PHQ-2 Score: 0  Alcohol Screen: Not on file  Housing: Unknown (05/29/2023)   Received from [facility]   Epic    At any time in the past 12  months, were you homeless or living in a shelter (including now)?: No    Number of Times Moved in the Last Year: Not on file    Unable to Pay for Housing in the Last Year: Not on file  Utilities: Not on file  Health Literacy: Not on file     Family History  Problem Relation Age of Onset   Diabetes Mellitus II Mother    Hypertension Father     ROS: Prolonged bleeding left upper arm AV fistula after dialysis   Physical Examination  Vitals:   05/27/24 0755 05/27/24 0807  BP: (!) 170/69 ROLLEN)  145/85  Pulse: 76 74  Resp: 12 16  SpO2: 97% 98%   There is no height or weight on file to calculate BMI.  Awake alert and oriented Nonlabored respirations Left upper arm AV fistula has pulsatility  CBC    Component Value Date/Time   WBC 7.1 11/19/2022 1440   RBC 4.23 11/19/2022 1440   HGB 12.5 (L) 11/19/2022 1440   HGB 12.7 (L) 10/18/2021 1114   HCT 38.2 (L) 11/19/2022 1440   HCT 38.8 10/18/2021 1114   PLT 251 11/19/2022 1440   PLT 216 10/18/2021 1114   MCV 90.3 11/19/2022 1440   MCV 87 10/18/2021 1114   MCH 29.6 11/19/2022 1440   MCHC 32.7 11/19/2022 1440   RDW 14.3 11/19/2022 1440   RDW 14.8 10/18/2021 1114   LYMPHSABS 2.1 10/15/2022 1050   MONOABS 0.7 10/15/2022 1050   EOSABS 0.3 10/15/2022 1050   BASOSABS 0.1 10/15/2022 1050    BMET    Component Value Date/Time   NA 140 11/19/2022 1440   NA 136 10/18/2021 1114   K 3.9 11/19/2022 1440   CL 95 (L) 11/19/2022 1440   CO2 30 11/19/2022 1440   GLUCOSE 132 (H) 11/19/2022 1440   BUN 29 (H) 11/19/2022 1440   BUN 23 10/18/2021 1114   CREATININE 4.13 (H) 11/19/2022 1440   CALCIUM  8.2 (L) 11/19/2022 1440   GFRNONAA 14 (L) 10/15/2022 1050   GFRAA 16 (L) 01/23/2020 0229    COAGS: Lab Results  Component Value Date   INR 1.1 10/28/2019     Non-Invasive Vascular Imaging:   No new studies  ASSESSMENT/PLAN: This is a 74 y.o. male history of end-stage renal disease on dialysis via left upper arm AV fistula.  He has  prolonged bleeding and pulsatility by physical exam.  We have discussed his options and we discussed proceeding with fistulogram and he is in agreement.  We discussed risk benefits alternatives he demonstrates good understanding and agrees to proceed.  Alexcis Bicking C. Sheree, MD [facility] Office: [phone] Pager: [phone]     [1] No Known Allergies  "

## 2024-06-01 ENCOUNTER — Encounter (HOSPITAL_COMMUNITY): Payer: Self-pay | Admitting: Vascular Surgery

## 2024-06-02 NOTE — Telephone Encounter (Signed)
 Lipid Completed 06/22/23

## 2024-06-02 NOTE — Telephone Encounter (Signed)
 Pt wife called in for a refill. Informed her he is overdue for a f/u so we can fill but he needs to make an appt as well. She became frustrated and stated pt just saw his PCP. She will call them instead for refill.
